# Patient Record
Sex: Female | Born: 1975
Health system: Southern US, Community
[De-identification: ages and names within clinical notes are randomized; demographics above are authoritative.]

## PROBLEM LIST (undated history)

## (undated) DIAGNOSIS — F32A Depression, unspecified: Secondary | ICD-10-CM

## (undated) DIAGNOSIS — D649 Anemia, unspecified: Secondary | ICD-10-CM

## (undated) DIAGNOSIS — I10 Essential (primary) hypertension: Secondary | ICD-10-CM

## (undated) DIAGNOSIS — C50511 Malignant neoplasm of lower-outer quadrant of right female breast: Secondary | ICD-10-CM

## (undated) DIAGNOSIS — Z8042 Family history of malignant neoplasm of prostate: Secondary | ICD-10-CM

## (undated) DIAGNOSIS — F419 Anxiety disorder, unspecified: Secondary | ICD-10-CM

## (undated) DIAGNOSIS — I890 Lymphedema, not elsewhere classified: Secondary | ICD-10-CM

## (undated) DIAGNOSIS — Z800 Family history of malignant neoplasm of digestive organs: Secondary | ICD-10-CM

## (undated) DIAGNOSIS — E785 Hyperlipidemia, unspecified: Secondary | ICD-10-CM

## (undated) DIAGNOSIS — Z5189 Encounter for other specified aftercare: Secondary | ICD-10-CM

## (undated) DIAGNOSIS — Z803 Family history of malignant neoplasm of breast: Secondary | ICD-10-CM

## (undated) DIAGNOSIS — Z923 Personal history of irradiation: Secondary | ICD-10-CM

## (undated) DIAGNOSIS — K219 Gastro-esophageal reflux disease without esophagitis: Secondary | ICD-10-CM

## (undated) DIAGNOSIS — Z9221 Personal history of antineoplastic chemotherapy: Secondary | ICD-10-CM

## (undated) HISTORY — DX: Family history of malignant neoplasm of breast: Z80.3

## (undated) HISTORY — DX: Depression, unspecified: F32.A

## (undated) HISTORY — PX: DILATION AND CURETTAGE OF UTERUS: SHX78

## (undated) HISTORY — DX: Family history of malignant neoplasm of digestive organs: Z80.0

## (undated) HISTORY — DX: Malignant neoplasm of lower-outer quadrant of right female breast: C50.511

## (undated) HISTORY — DX: Encounter for other specified aftercare: Z51.89

## (undated) HISTORY — DX: Family history of malignant neoplasm of prostate: Z80.42

## (undated) HISTORY — DX: Personal history of irradiation: Z92.3

## (undated) HISTORY — PX: REDUCTION MAMMAPLASTY: SUR839

---

## 1999-05-22 ENCOUNTER — Emergency Department (HOSPITAL_COMMUNITY): Admission: EM | Admit: 1999-05-22 | Discharge: 1999-05-22 | Payer: Self-pay | Admitting: Emergency Medicine

## 1999-06-29 ENCOUNTER — Encounter: Payer: Self-pay | Admitting: Obstetrics & Gynecology

## 1999-06-29 ENCOUNTER — Observation Stay (HOSPITAL_COMMUNITY): Admission: AD | Admit: 1999-06-29 | Discharge: 1999-06-29 | Payer: Self-pay | Admitting: Obstetrics

## 1999-06-29 ENCOUNTER — Encounter (INDEPENDENT_AMBULATORY_CARE_PROVIDER_SITE_OTHER): Payer: Self-pay

## 1999-12-31 ENCOUNTER — Inpatient Hospital Stay (HOSPITAL_COMMUNITY): Admission: AD | Admit: 1999-12-31 | Discharge: 2000-01-01 | Payer: Self-pay | Admitting: Obstetrics & Gynecology

## 1999-12-31 ENCOUNTER — Encounter (INDEPENDENT_AMBULATORY_CARE_PROVIDER_SITE_OTHER): Payer: Self-pay | Admitting: Specialist

## 1999-12-31 ENCOUNTER — Encounter: Payer: Self-pay | Admitting: Obstetrics & Gynecology

## 2000-10-27 ENCOUNTER — Encounter (INDEPENDENT_AMBULATORY_CARE_PROVIDER_SITE_OTHER): Payer: Self-pay | Admitting: Specialist

## 2000-10-27 ENCOUNTER — Inpatient Hospital Stay (HOSPITAL_COMMUNITY): Admission: AD | Admit: 2000-10-27 | Discharge: 2000-10-29 | Payer: Self-pay | Admitting: Obstetrics

## 2000-10-28 ENCOUNTER — Encounter: Payer: Self-pay | Admitting: Obstetrics

## 2009-03-14 ENCOUNTER — Encounter: Admission: RE | Admit: 2009-03-14 | Discharge: 2009-03-14 | Payer: Self-pay | Admitting: Obstetrics

## 2010-09-08 NOTE — Discharge Summary (Signed)
[facility]  Patient:    Melanie Moss, Melanie Moss                       MRN: [id number] Adm. Date:  [id number] Disc. Date: [id number] Attending:  Tammi Sou Dictator:   Marvis Moeller, C.N.M.                           Discharge Summary  ADMISSION DIAGNOSIS:          A 16-week intrauterine fetal demise.  DISCHARGE DIAGNOSES:          1. Spontaneous vaginal delivery at 16 weeks.                               2. Weak cervix.  HISTORY:                      This is a 35 year old, G3, P0-0-2-0, who presented to Maternity Admissions Unit at approximately [redacted] weeks gestation with no prior prenatal care. She reported the onset of cramping along with leakage of fluid that was blood-tinged. She also described pelvic pressure. Her obstetrical history was significant for two midtrimester SABs at approximately 16 weeks, also with minimal bleeding and cramping prior to passage of fetus. Her past medical history is noncontributory. She denied history of STDs. She denied fever, chills, or abdominal pain prior to todays pain. On presentation, she was afebrile at 97.2, pulse 103, blood pressure 140/86. The fetal heart rate was unobtainable per Doptone. Therefore, an ultrasound was done. This revealed a 15-week IUFD. The patient was admitted and given intravaginal Cytotec.  HOSPITAL COURSE:              On hospital day #1, she delivered vaginally a nonviable fetus that was grossly normal appearing. The placenta detached but required a sponge stick removal from the cervix. There was minimal bleeding and the patient was observed overnight to be sure she had no further bleeding. The placenta was sent for pathology and was 73 g with marked chorioamnionitis and a three-vessel cord, female fetus approximate age 62 to 15 weeks. Chromosomal studies were noted to have been sent but the results are not available at the time of this dictation.  DISCHARGE MEDICATIONS:        The  patient was sent home on Ortho Tri-Cyclen 28s to begin two weeks after her miscarriage. She was also given Niferex one p.o. every q.d. since she was anemic with a hemoglobin of 8.8 on October 29, 2000.   DISCHARGE FOLLOWUP:           She was counseled at length regarding her cervical weakness and the need to report her next pregnancy as early as possible, and the fact that she is a cerclage candidate with her next pregnancy. She was given an appointment to follow up in [facility] in two weeks and she also was visited by the Comfort Team and seemed to be coping appropriately with her loss. DD:  11/23/00 TD:  11/25/00 Job: [id number]

## 2010-11-17 LAB — HEPATITIS B SURFACE ANTIGEN: Hepatitis B Surface Ag: NEGATIVE

## 2010-11-17 LAB — HIV ANTIBODY (ROUTINE TESTING W REFLEX): HIV: NONREACTIVE

## 2010-11-21 ENCOUNTER — Other Ambulatory Visit: Payer: Self-pay | Admitting: Obstetrics

## 2010-11-21 DIAGNOSIS — O09529 Supervision of elderly multigravida, unspecified trimester: Secondary | ICD-10-CM

## 2010-12-22 ENCOUNTER — Ambulatory Visit (HOSPITAL_COMMUNITY)
Admission: RE | Admit: 2010-12-22 | Discharge: 2010-12-22 | Disposition: A | Discharge status: Home / Self Care | Payer: Managed Care, Other (non HMO) | Source: Ambulatory Visit | Attending: Obstetrics | Admitting: Obstetrics

## 2010-12-22 ENCOUNTER — Ambulatory Visit (HOSPITAL_COMMUNITY): Admission: RE | Admit: 2010-12-22 | Payer: Managed Care, Other (non HMO) | Source: Ambulatory Visit

## 2010-12-22 ENCOUNTER — Encounter (HOSPITAL_COMMUNITY): Payer: Self-pay

## 2010-12-22 VITALS — BP 143/97 | HR 80 | Wt 149.0 lb

## 2010-12-22 DIAGNOSIS — Z8751 Personal history of pre-term labor: Secondary | ICD-10-CM | POA: Insufficient documentation

## 2010-12-22 DIAGNOSIS — D649 Anemia, unspecified: Secondary | ICD-10-CM | POA: Insufficient documentation

## 2010-12-22 DIAGNOSIS — K219 Gastro-esophageal reflux disease without esophagitis: Secondary | ICD-10-CM | POA: Insufficient documentation

## 2010-12-22 DIAGNOSIS — O10019 Pre-existing essential hypertension complicating pregnancy, unspecified trimester: Secondary | ICD-10-CM | POA: Insufficient documentation

## 2010-12-22 DIAGNOSIS — I10 Essential (primary) hypertension: Secondary | ICD-10-CM | POA: Insufficient documentation

## 2010-12-22 DIAGNOSIS — O2620 Pregnancy care for patient with recurrent pregnancy loss, unspecified trimester: Secondary | ICD-10-CM | POA: Insufficient documentation

## 2010-12-22 DIAGNOSIS — O169 Unspecified maternal hypertension, unspecified trimester: Secondary | ICD-10-CM

## 2010-12-22 DIAGNOSIS — O09529 Supervision of elderly multigravida, unspecified trimester: Secondary | ICD-10-CM | POA: Insufficient documentation

## 2010-12-22 DIAGNOSIS — O09299 Supervision of pregnancy with other poor reproductive or obstetric history, unspecified trimester: Secondary | ICD-10-CM | POA: Insufficient documentation

## 2010-12-22 HISTORY — DX: Hyperlipidemia, unspecified: E78.5

## 2010-12-22 HISTORY — DX: Gastro-esophageal reflux disease without esophagitis: K21.9

## 2010-12-22 HISTORY — DX: Anemia, unspecified: D64.9

## 2010-12-22 HISTORY — DX: Essential (primary) hypertension: I10

## 2010-12-22 NOTE — Progress Notes (Signed)
Genetic Counseling  High-Risk Gestation Note  Appointment Date:  12/22/2010 Referred By: Brock Bad, MD Date of Birth:  11/22/1975  Pregnancy History: G5P0400 Estimated Date of Delivery: 07/04/11 Estimated Gestational Age: [redacted]w[redacted]d  I met with [name] for prenatal genetic counseling given advanced maternal age.   She was counseled regarding maternal age and the association with risk for chromosome conditions due to nondisjunction with aging of the ova.   We reviewed chromosomes, nondisjunction, and the associated 1 in 32 risk for fetal aneuploidy in the first trimester related to a maternal age of 54 at delivery.  She was counseled that the risk for aneuploidy decreases as gestational age increases, accounting for those pregnancies which spontaneously abort.  We specifically discussed Down syndrome (trisomy 97), trisomies 68 and 50, and sex chromosome aneuploidies (47,XXX and 47,XXY) including the common features and prognoses of each.    We reviewed available screening and diagnostic options.  Regarding screening tests, we discussed the options of First screen, Integrated screen, and ultrasound.  She understands that screening tests are used to modify a patient's a priori risk for aneuploidy, typically based on age.  This estimate provides a pregnancy specific risk assessment.  We also reviewed the availability of diagnostic options including CVS and amniocentesis.  risk of 1 in 100 was given for CVS and 1 in 200-300 was given for amniocentesis, the primary complication being spontaneous pregnancy loss.  It was discussed that not all birth defects can be identified with these procedures.   We discussed the risks, limitations, and benefits of each.  After reviewing these options, [name] elected to have First screen and will return in one week to obtain NT and blood work. She declined CVS and amniocentesis at this time.  She wishes to pursue these options to help ascertain her pregnancy  specific risks for aneuploidy and will possibly consider amniocentesis pending the results of this testing. She understands that ultrasound and First screen cannot rule out all birth defects or genetic syndromes.  However, they were counseled that 50-80% of fetuses with Down syndrome and up to 90% of fetuses with trisomies 13 and 18, when well visualized, have detectable anomalies or soft markers by ultrasound.  Both family histories were reviewed and found to be contributory  for recurrent pregnancy loss. The patient reported that her first three pregnancies, with a previous partner, resulted in a 23 week loss, 20 week loss, and 19 week loss. She reported that her fourth pregnancy, with a different partner, resulted in a 20 week loss. [name] stated that her D and C was performed at [facility] following her third loss and that studies were performed following the procedure. She was unsure what testing was done but stated that results were to be forward to Dr. Clearance Coots. We discussed that information regarding this testing may be helpful regarding an underlying cause. No additional work-up has been performed per the patient's report. There can be many different causes for pregnancy losses.  When a person has experienced more than three losses, we typically begin to search for specific factors causing the miscarriages.  We discussed several of these causes, including chromosome rearrangements, clotting factors, antibodies, and structural differences in the uterus. We are available to offer additional studies, if desired, in an attempt to determine the underlying cause for the patient's recurrent pregnancy loss. Without further information regarding the provided family history, an accurate genetic risk cannot be calculated.  Further genetic counseling is warranted if more  information is obtained.  We discussed sickle cell anemia (SCA) including: the features of SCA, autosomal recessive inheritance, the  approximate 1 in 10 carrier frequency in the [place] American population, and the availability of carrier testing.  We also discussed that testing for SCA can be done prenatally if needed, and it is also routinely screened for on the newborn screening panel.  Medical records indicate that the patient had a negative screen for hemoglobin S. Screening for additional hemoglobin variants is available through hemoglobin electrophoresis, if desired. The patient declined this at this time.   She denied exposure to environmental toxins or chemical agents.  She denied the use of alcohol, tobacco or street drugs.  She denied significant viral illnesses during the course of her pregnancy.  Her medical and surgical history were contibutory for hypertension for which she is treated with bystolic and for high cholesterol, for which she takes crestor. The patient was seen by Dr. Tona Sensing to further discuss management of these conditions in pregnancy.   A complete obstetrical ultrasound was performed at the time of today's evaluation.  The ultrasound report is reported separately.    We counseled the patient for approximately 40 minutes regarding the above risks.     Clydie Braun Venola Castello, MS, [facility] 12/22/2010

## 2010-12-22 NOTE — Progress Notes (Signed)
34 year old G5 P0130 at [redacted] weeks gestation with the following issues:  Hypertension complicating pregnancy 642.90 Multiyear history of chronic hypertension, requiring Bystolic 10 mg with recent poor control on this dose. Melanie Moss was counseled about the increased risk of poor fetal growth, preeclampsia, preterm birth, operative delivery in conjunction with pre-existing hypertension. If she acquires preeclampsia, it should be considered severe in the face of medication use for hypertension. Recommend increase Bystolic to 20 mg/day with max of 40 mg/day as needed to maintain BP in 130-140 systolic, 80-95 diastolic range.   Recommend 24 hour urine protein and creatinine clearance now and repeat at 12 week intervals to complete the pregnancy. Deliver at 39 weeks or sooner based on clinical findings. 2. Anemia 285.9  She has abnormal indices despite her hematocrit level and I agree with continued management with iron supplementation, with beneficial addition of ascorbic acid (e.g. Orange juice) 3 Acid reflux 530.81 Patient has history of recent nausea and vomiting but has positive response to signs/symptoms of reflux, not responding to Tums. Recommend use of Pepcid 20 mg BID, possibly larger dose if ineffective.  Return to vitamins, increased calcium and Vit D intake once she has improved her nausea/vomiting. 4. Recurrent pregnancy loss with current pregnancy; She describes four pregnancy losses between 18 weeks and 23 weeks, with and without signs of labor, no evidence of infection. Recommend initiation of 17 hydroxy progesterone at 16 weeks and continuation to 36 weeks.   Recommend initiation of transvaginal ultrasound for cervical integrity weekly from 16 weeks to 24 weeks.  Recommend fetal anatomic survey at 18-20 weeks, fetal growth assessment at 28 weeks and monthly thereafter, initiation of twice weekly NST's or weekly BPP from 32 weeks.  Total face-to-face consultation time was 40 minutes.   Thank you for the consultation.

## 2011-01-01 ENCOUNTER — Encounter (HOSPITAL_COMMUNITY): Payer: Self-pay | Admitting: *Deleted

## 2011-01-01 ENCOUNTER — Inpatient Hospital Stay (HOSPITAL_COMMUNITY): Payer: Managed Care, Other (non HMO)

## 2011-01-01 ENCOUNTER — Inpatient Hospital Stay (HOSPITAL_COMMUNITY)
Admission: AD | Admit: 2011-01-01 | Discharge: 2011-01-01 | Disposition: A | Discharge status: Home / Self Care | Payer: Managed Care, Other (non HMO) | Source: Ambulatory Visit | Attending: Obstetrics | Admitting: Obstetrics

## 2011-01-01 DIAGNOSIS — O209 Hemorrhage in early pregnancy, unspecified: Secondary | ICD-10-CM | POA: Insufficient documentation

## 2011-01-01 DIAGNOSIS — O4690 Antepartum hemorrhage, unspecified, unspecified trimester: Secondary | ICD-10-CM

## 2011-01-01 LAB — TYPE AND SCREEN
ABO/RH(D): O POS
Antibody Screen: NEGATIVE

## 2011-01-01 LAB — URINALYSIS, ROUTINE W REFLEX MICROSCOPIC
Bilirubin Urine: NEGATIVE
Glucose, UA: NEGATIVE mg/dL
Ketones, ur: NEGATIVE mg/dL
Leukocytes, UA: NEGATIVE
Nitrite: NEGATIVE
Protein, ur: NEGATIVE mg/dL
Specific Gravity, Urine: 1.02 (ref 1.005–1.030)
Urobilinogen, UA: 0.2 mg/dL (ref 0.0–1.0)
pH: 6 (ref 5.0–8.0)

## 2011-01-01 LAB — CBC
HCT: 34.7 % — ABNORMAL LOW (ref 36.0–46.0)
Hemoglobin: 11 g/dL — ABNORMAL LOW (ref 12.0–15.0)
MCH: 24.5 pg — ABNORMAL LOW (ref 26.0–34.0)
MCHC: 31.7 g/dL (ref 30.0–36.0)
MCV: 77.3 fL — ABNORMAL LOW (ref 78.0–100.0)
Platelets: 372 10*3/uL (ref 150–400)
RBC: 4.49 MIL/uL (ref 3.87–5.11)
RDW: 18.1 % — ABNORMAL HIGH (ref 11.5–15.5)
WBC: 12.2 10*3/uL — ABNORMAL HIGH (ref 4.0–10.5)

## 2011-01-01 LAB — WET PREP, GENITAL
Clue Cells Wet Prep HPF POC: NONE SEEN
Trich, Wet Prep: NONE SEEN
Yeast Wet Prep HPF POC: NONE SEEN

## 2011-01-01 LAB — URINE MICROSCOPIC-ADD ON

## 2011-01-01 LAB — ABO/RH: ABO/RH(D): O POS

## 2011-01-01 NOTE — Progress Notes (Signed)
Noted bleeding/spotting last night.  Has had a yellowish brown d/c- started on Sat. Had some cramps on Sat.  Denies any urinary problems. N/v continues.

## 2011-01-01 NOTE — Progress Notes (Signed)
Mild cramps, spotting. Cramps since Saturday, spotting since last night, red to dark brown in color.

## 2011-01-01 NOTE — ED Notes (Signed)
Has appt at [facility]FM tomorrow- high risk patient due to multiple miscarriages.

## 2011-01-01 NOTE — ED Provider Notes (Signed)
History     Chief Complaint  Patient presents with  . Vaginal Bleeding   35 yo G5P0400 at 13 weeks 5 days by LMP.  She has history of SAB with first 4 pregnancies between 14 and [redacted] weeks gestation.    Vaginal Bleeding The patient's primary symptoms include vaginal bleeding. The patient's pertinent negatives include no genital itching, genital lesions, genital odor, genital rash or vaginal discharge. This is a new problem. The current episode started today. Episode frequency: reddish spotting. The problem has been unchanged. The pain is mild (cramping). She is pregnant. Associated symptoms include frequency, nausea, urgency and vomiting. Pertinent negatives include no chills, constipation, diarrhea, dysuria, fever, headaches or hematuria. The vaginal discharge was bloody and brown. The vaginal bleeding is spotting. She has not been passing clots. She has not been passing tissue. The symptoms are aggravated by nothing. She has tried nothing for the symptoms.    OB History    Grav Para Term Preterm Abortions TAB SAB Ect Mult Living   5 4 0 4 0 0 0 0 0 0       Past Medical History  Diagnosis Date  . Hypertension   . Preterm labor   . GERD (gastroesophageal reflux disease)   . Anemia   . Hyperlipidemia     Past Surgical History  Procedure Date  . Dilation and curettage of uterus     Family History  Problem Relation Age of Onset  . Hypertension Mother   . Hypertension Father     History  Substance Use Topics  . Smoking status: Never Smoker   . Smokeless tobacco: Never Used  . Alcohol Use: No    Allergies: No Known Allergies  Prescriptions prior to admission  Medication Sig Dispense Refill  . nebivolol (BYSTOLIC) 10 MG tablet Take 10 mg by mouth daily.       . nebivolol (BYSTOLIC) 10 MG tablet Take 10 mg by mouth daily.        . Rosuvastatin Calcium (CRESTOR PO) Take by mouth.          Review of Systems  Constitutional: Negative for fever and chills.    Gastrointestinal: Positive for nausea and vomiting. Negative for diarrhea and constipation.  Genitourinary: Positive for urgency, frequency and vaginal bleeding. Negative for dysuria, hematuria and vaginal discharge.  Neurological: Negative for headaches.  All other systems reviewed and are negative.   Physical Exam   Blood pressure 135/100, pulse 74, temperature 99.2 F (37.3 C), temperature source Oral, resp. rate 20, height 5\' 3"  (1.6 m), weight 68.493 kg (151 lb), last menstrual period 09/27/2010, unknown if currently breastfeeding.  Physical Exam  Constitutional: She appears well-developed and well-nourished.  HENT:  Head: Normocephalic and atraumatic.  Eyes: EOM are normal. Pupils are equal, round, and reactive to light.  GI: Soft. She exhibits no distension and no mass. There is no tenderness. There is no rebound and no guarding.  Genitourinary: Cervix exhibits no discharge and no friability.       No pooling of blood in vaginal vault.  No blood from cervical os with valsalva.   FHR by Doppler 160 MAU Course  Procedures [place]  Assessment and Plan  CBC, type and screen, UA, wet prep, GC/chlamydia, [place] pending. Signed out to [place], [place].  Melanie Moss 01/01/2011, 7:40 PM

## 2011-01-01 NOTE — ED Provider Notes (Signed)
History     Chief Complaint  Patient presents with  . Vaginal Bleeding   35 yo G5P0400 at 13 weeks 5 days by LMP.  She has history of SAB with first 4 pregnancies between 14 and [redacted] weeks gestation.    Vaginal Bleeding The patient's primary symptoms include vaginal bleeding. The patient's pertinent negatives include no genital itching, genital lesions, genital odor, genital rash or vaginal discharge. This is a new problem. The current episode started today. Episode frequency: reddish spotting. The problem has been unchanged. The pain is mild (cramping). She is pregnant. Associated symptoms include frequency, nausea, urgency and vomiting. Pertinent negatives include no chills, constipation, diarrhea, dysuria, fever, headaches or hematuria. The vaginal discharge was bloody and brown. The vaginal bleeding is spotting. She has not been passing clots. She has not been passing tissue. The symptoms are aggravated by nothing. She has tried nothing for the symptoms.    OB History    Grav Para Term Preterm Abortions TAB SAB Ect Mult Living   5 4 0 4 0 0 0 0 0 0       Past Medical History  Diagnosis Date  . Hypertension   . Preterm labor   . GERD (gastroesophageal reflux disease)   . Anemia   . Hyperlipidemia     Past Surgical History  Procedure Date  . Dilation and curettage of uterus     Family History  Problem Relation Age of Onset  . Hypertension Mother   . Hypertension Father     History  Substance Use Topics  . Smoking status: Never Smoker   . Smokeless tobacco: Never Used  . Alcohol Use: No    Allergies: No Known Allergies  Prescriptions prior to admission  Medication Sig Dispense Refill  . nebivolol (BYSTOLIC) 10 MG tablet Take 10 mg by mouth daily.        Marland Kitchen DISCONTD: nebivolol (BYSTOLIC) 10 MG tablet Take 10 mg by mouth daily.       Marland Kitchen DISCONTD: Rosuvastatin Calcium (CRESTOR PO) Take by mouth.          Review of Systems  Constitutional: Negative for fever and  chills.  Gastrointestinal: Positive for nausea and vomiting. Negative for diarrhea and constipation.  Genitourinary: Positive for urgency, frequency and vaginal bleeding. Negative for dysuria, hematuria and vaginal discharge.  Neurological: Negative for headaches.  All other systems reviewed and are negative.   Physical Exam   Blood pressure 137/93, pulse 73, temperature 99.2 F (37.3 C), temperature source Oral, resp. rate 18, height 5\' 3"  (1.6 m), weight 68.493 kg (151 lb), last menstrual period 09/27/2010, unknown if currently breastfeeding.  Physical Exam  Constitutional: She appears well-developed and well-nourished.  HENT:  Head: Normocephalic and atraumatic.  Eyes: EOM are normal. Pupils are equal, round, and reactive to light.  GI: Soft. She exhibits no distension and no mass. There is no tenderness. There is no rebound and no guarding.  Genitourinary: Cervix exhibits no discharge and no friability.       No pooling of blood in vaginal vault.  No blood from cervical os with valsalva.   FHR by Doppler 160 MAU Course  Procedures  [place]: 12.2 week SIUP FHR 158 No evidence of abruption or previa. Placenta posterior Normal fluid Results for orders placed during the hospital encounter of 01/01/11 (from the past 24 hour(s))  URINALYSIS, ROUTINE W REFLEX MICROSCOPIC     Status: Abnormal   Collection Time   01/01/11  7:36 PM  Component Value Range   Color, Urine YELLOW  YELLOW    Appearance HAZY (*) CLEAR    Specific Gravity, Urine 1.020  1.005 - 1.030    pH 6.0  5.0 - 8.0    Glucose, UA NEGATIVE  NEGATIVE (mg/dL)   Hgb urine dipstick MODERATE (*) NEGATIVE    Bilirubin Urine NEGATIVE  NEGATIVE    Ketones, ur NEGATIVE  NEGATIVE (mg/dL)   Protein, ur NEGATIVE  NEGATIVE (mg/dL)   Urobilinogen, UA 0.2  0.0 - 1.0 (mg/dL)   Nitrite NEGATIVE  NEGATIVE    Leukocytes, UA NEGATIVE  NEGATIVE   WET PREP, GENITAL     Status: Abnormal   Collection Time   01/01/11  7:36 PM       Component Value Range   Yeast, Wet Prep NONE SEEN  NONE SEEN    Trich, Wet Prep NONE SEEN  NONE SEEN    Clue Cells, Wet Prep NONE SEEN  NONE SEEN    WBC, Wet Prep HPF POC MODERATE (*) NONE SEEN   URINE MICROSCOPIC-ADD ON     Status: Abnormal   Collection Time   01/01/11  7:36 PM      Component Value Range   Squamous Epithelial / LPF FEW (*) RARE    WBC, UA 0-2  <3 (WBC/hpf)   Bacteria, UA FEW (*) RARE   CBC     Status: Abnormal   Collection Time   01/01/11  8:38 PM      Component Value Range   WBC 12.2 (*) 4.0 - 10.5 (K/uL)   RBC 4.49  3.87 - 5.11 (MIL/uL)   Hemoglobin 11.0 (*) 12.0 - 15.0 (g/dL)   HCT 04.5 (*) 40.9 - 46.0 (%)   MCV 77.3 (*) 78.0 - 100.0 (fL)   MCH 24.5 (*) 26.0 - 34.0 (pg)   MCHC 31.7  30.0 - 36.0 (g/dL)   RDW 81.1 (*) 91.4 - 15.5 (%)   Platelets 372  150 - 400 (K/uL)  TYPE AND SCREEN     Status: Normal   Collection Time   01/01/11  8:39 PM      Component Value Range   ABO/RH(D) O POS     Antibody Screen NEG     Sample Expiration 01/04/2011      Assessment and Plan  CBC, type and screen, UA, wet prep, GC/chlamydia, [place] pending. Signed out to [place], [place].  Dorathy Kinsman 01/01/2011, 9:51 PM

## 2011-01-02 ENCOUNTER — Ambulatory Visit (HOSPITAL_COMMUNITY)
Admission: RE | Admit: 2011-01-02 | Discharge: 2011-01-02 | Disposition: A | Discharge status: Home / Self Care | Payer: Managed Care, Other (non HMO) | Source: Ambulatory Visit | Attending: Obstetrics | Admitting: Obstetrics

## 2011-01-02 DIAGNOSIS — O09529 Supervision of elderly multigravida, unspecified trimester: Secondary | ICD-10-CM | POA: Insufficient documentation

## 2011-01-02 DIAGNOSIS — O3510X0 Maternal care for (suspected) chromosomal abnormality in fetus, unspecified, not applicable or unspecified: Secondary | ICD-10-CM | POA: Insufficient documentation

## 2011-01-02 DIAGNOSIS — O169 Unspecified maternal hypertension, unspecified trimester: Secondary | ICD-10-CM

## 2011-01-02 DIAGNOSIS — Z8751 Personal history of pre-term labor: Secondary | ICD-10-CM | POA: Insufficient documentation

## 2011-01-02 DIAGNOSIS — O10019 Pre-existing essential hypertension complicating pregnancy, unspecified trimester: Secondary | ICD-10-CM | POA: Insufficient documentation

## 2011-01-02 DIAGNOSIS — O351XX0 Maternal care for (suspected) chromosomal abnormality in fetus, not applicable or unspecified: Secondary | ICD-10-CM | POA: Insufficient documentation

## 2011-01-02 DIAGNOSIS — O09299 Supervision of pregnancy with other poor reproductive or obstetric history, unspecified trimester: Secondary | ICD-10-CM | POA: Insufficient documentation

## 2011-01-02 DIAGNOSIS — O3410 Maternal care for benign tumor of corpus uteri, unspecified trimester: Secondary | ICD-10-CM | POA: Insufficient documentation

## 2011-01-02 LAB — GC/CHLAMYDIA PROBE AMP, GENITAL
Chlamydia, DNA Probe: NEGATIVE
GC Probe Amp, Genital: NEGATIVE

## 2011-01-02 NOTE — ED Notes (Signed)
Patient states she had bleeding yesterday and went to MAU for evaluation.

## 2011-01-19 ENCOUNTER — Ambulatory Visit (HOSPITAL_COMMUNITY)
Admission: RE | Admit: 2011-01-19 | Discharge: 2011-01-19 | Disposition: A | Discharge status: Home / Self Care | Payer: Managed Care, Other (non HMO) | Source: Ambulatory Visit | Attending: Obstetrics | Admitting: Obstetrics

## 2011-01-19 ENCOUNTER — Other Ambulatory Visit (HOSPITAL_COMMUNITY): Payer: Self-pay | Admitting: Obstetrics and Gynecology

## 2011-01-19 ENCOUNTER — Other Ambulatory Visit (HOSPITAL_COMMUNITY): Payer: Self-pay | Admitting: Maternal and Fetal Medicine

## 2011-01-19 DIAGNOSIS — O3410 Maternal care for benign tumor of corpus uteri, unspecified trimester: Secondary | ICD-10-CM | POA: Insufficient documentation

## 2011-01-19 DIAGNOSIS — O10019 Pre-existing essential hypertension complicating pregnancy, unspecified trimester: Secondary | ICD-10-CM | POA: Insufficient documentation

## 2011-01-19 DIAGNOSIS — O169 Unspecified maternal hypertension, unspecified trimester: Secondary | ICD-10-CM

## 2011-01-19 DIAGNOSIS — O2620 Pregnancy care for patient with recurrent pregnancy loss, unspecified trimester: Secondary | ICD-10-CM

## 2011-01-19 DIAGNOSIS — O09529 Supervision of elderly multigravida, unspecified trimester: Secondary | ICD-10-CM | POA: Insufficient documentation

## 2011-01-19 DIAGNOSIS — O09299 Supervision of pregnancy with other poor reproductive or obstetric history, unspecified trimester: Secondary | ICD-10-CM | POA: Insufficient documentation

## 2011-01-19 DIAGNOSIS — Z8751 Personal history of pre-term labor: Secondary | ICD-10-CM | POA: Insufficient documentation

## 2011-02-09 ENCOUNTER — Other Ambulatory Visit (HOSPITAL_COMMUNITY): Payer: Self-pay | Admitting: Obstetrics and Gynecology

## 2011-02-09 ENCOUNTER — Ambulatory Visit (HOSPITAL_COMMUNITY)
Admission: RE | Admit: 2011-02-09 | Discharge: 2011-02-09 | Disposition: A | Discharge status: Home / Self Care | Payer: Managed Care, Other (non HMO) | Source: Ambulatory Visit | Attending: Obstetrics | Admitting: Obstetrics

## 2011-02-09 DIAGNOSIS — Z8751 Personal history of pre-term labor: Secondary | ICD-10-CM | POA: Insufficient documentation

## 2011-02-09 DIAGNOSIS — Z1389 Encounter for screening for other disorder: Secondary | ICD-10-CM | POA: Insufficient documentation

## 2011-02-09 DIAGNOSIS — O169 Unspecified maternal hypertension, unspecified trimester: Secondary | ICD-10-CM

## 2011-02-09 DIAGNOSIS — O09529 Supervision of elderly multigravida, unspecified trimester: Secondary | ICD-10-CM | POA: Insufficient documentation

## 2011-02-09 DIAGNOSIS — O2620 Pregnancy care for patient with recurrent pregnancy loss, unspecified trimester: Secondary | ICD-10-CM

## 2011-02-09 DIAGNOSIS — O358XX0 Maternal care for other (suspected) fetal abnormality and damage, not applicable or unspecified: Secondary | ICD-10-CM | POA: Insufficient documentation

## 2011-02-09 DIAGNOSIS — O3410 Maternal care for benign tumor of corpus uteri, unspecified trimester: Secondary | ICD-10-CM | POA: Insufficient documentation

## 2011-02-09 DIAGNOSIS — O10019 Pre-existing essential hypertension complicating pregnancy, unspecified trimester: Secondary | ICD-10-CM | POA: Insufficient documentation

## 2011-02-09 DIAGNOSIS — O09299 Supervision of pregnancy with other poor reproductive or obstetric history, unspecified trimester: Secondary | ICD-10-CM | POA: Insufficient documentation

## 2011-02-09 DIAGNOSIS — Z363 Encounter for antenatal screening for malformations: Secondary | ICD-10-CM | POA: Insufficient documentation

## 2011-02-15 ENCOUNTER — Other Ambulatory Visit: Payer: Self-pay | Admitting: Obstetrics & Gynecology

## 2011-02-15 ENCOUNTER — Ambulatory Visit (HOSPITAL_COMMUNITY)
Admission: RE | Admit: 2011-02-15 | Discharge: 2011-02-15 | Disposition: A | Discharge status: Home / Self Care | Payer: Managed Care, Other (non HMO) | Source: Ambulatory Visit | Attending: Obstetrics | Admitting: Obstetrics

## 2011-02-15 ENCOUNTER — Other Ambulatory Visit: Payer: Self-pay | Admitting: Obstetrics

## 2011-02-15 DIAGNOSIS — O3410 Maternal care for benign tumor of corpus uteri, unspecified trimester: Secondary | ICD-10-CM | POA: Insufficient documentation

## 2011-02-15 DIAGNOSIS — Z09 Encounter for follow-up examination after completed treatment for conditions other than malignant neoplasm: Secondary | ICD-10-CM

## 2011-02-15 DIAGNOSIS — Z8751 Personal history of pre-term labor: Secondary | ICD-10-CM | POA: Insufficient documentation

## 2011-02-15 DIAGNOSIS — O2620 Pregnancy care for patient with recurrent pregnancy loss, unspecified trimester: Secondary | ICD-10-CM

## 2011-02-15 DIAGNOSIS — O09529 Supervision of elderly multigravida, unspecified trimester: Secondary | ICD-10-CM | POA: Insufficient documentation

## 2011-02-15 DIAGNOSIS — O09299 Supervision of pregnancy with other poor reproductive or obstetric history, unspecified trimester: Secondary | ICD-10-CM | POA: Insufficient documentation

## 2011-02-15 DIAGNOSIS — O10019 Pre-existing essential hypertension complicating pregnancy, unspecified trimester: Secondary | ICD-10-CM | POA: Insufficient documentation

## 2011-02-15 DIAGNOSIS — O169 Unspecified maternal hypertension, unspecified trimester: Secondary | ICD-10-CM

## 2011-02-16 ENCOUNTER — Ambulatory Visit (HOSPITAL_COMMUNITY): Payer: Managed Care, Other (non HMO)

## 2011-02-19 ENCOUNTER — Encounter (HOSPITAL_COMMUNITY): Payer: Self-pay | Admitting: Obstetrics & Gynecology

## 2011-02-19 ENCOUNTER — Ambulatory Visit (HOSPITAL_COMMUNITY): Payer: Managed Care, Other (non HMO) | Admitting: Anesthesiology

## 2011-02-19 ENCOUNTER — Encounter (HOSPITAL_COMMUNITY): Payer: Self-pay | Admitting: Anesthesiology

## 2011-02-19 ENCOUNTER — Encounter (HOSPITAL_COMMUNITY)
Admission: RE | Disposition: A | Discharge status: Home / Self Care | Payer: Self-pay | Source: Ambulatory Visit | Attending: Obstetrics & Gynecology

## 2011-02-19 ENCOUNTER — Ambulatory Visit (HOSPITAL_COMMUNITY)
Admission: RE | Admit: 2011-02-19 | Discharge: 2011-02-19 | Disposition: A | Discharge status: Home / Self Care | Payer: Managed Care, Other (non HMO) | Source: Ambulatory Visit | Attending: Obstetrics & Gynecology | Admitting: Obstetrics & Gynecology

## 2011-02-19 DIAGNOSIS — N883 Incompetence of cervix uteri: Secondary | ICD-10-CM | POA: Diagnosis present

## 2011-02-19 DIAGNOSIS — O3430 Maternal care for cervical incompetence, unspecified trimester: Secondary | ICD-10-CM | POA: Insufficient documentation

## 2011-02-19 HISTORY — PX: CERVICAL CERCLAGE: SHX1329

## 2011-02-19 LAB — CBC
HCT: 32.4 % — ABNORMAL LOW (ref 36.0–46.0)
Hemoglobin: 10.4 g/dL — ABNORMAL LOW (ref 12.0–15.0)
MCH: 24.9 pg — ABNORMAL LOW (ref 26.0–34.0)
MCHC: 32.1 g/dL (ref 30.0–36.0)
MCV: 77.5 fL — ABNORMAL LOW (ref 78.0–100.0)
Platelets: 364 10*3/uL (ref 150–400)
RBC: 4.18 MIL/uL (ref 3.87–5.11)
RDW: 17.2 % — ABNORMAL HIGH (ref 11.5–15.5)
WBC: 13.1 10*3/uL — ABNORMAL HIGH (ref 4.0–10.5)

## 2011-02-19 LAB — BASIC METABOLIC PANEL
BUN: 9 mg/dL (ref 6–23)
CO2: 22 mEq/L (ref 19–32)
Calcium: 10.2 mg/dL (ref 8.4–10.5)
Chloride: 104 mEq/L (ref 96–112)
Creatinine, Ser: 0.65 mg/dL (ref 0.50–1.10)
GFR calc Af Amer: >90 mL/min (ref >90)
GFR calc non Af Amer: >90 mL/min (ref >90)
Glucose, Bld: 101 mg/dL — ABNORMAL HIGH (ref 70–99)
Potassium: 3.8 mEq/L (ref 3.5–5.1)
Sodium: 136 mEq/L (ref 135–145)

## 2011-02-19 SURGERY — CERCLAGE, CERVIX, VAGINAL APPROACH
Anesthesia: Spinal | Site: Vagina | Wound class: Clean Contaminated

## 2011-02-19 MED ORDER — LACTATED RINGERS IV SOLN
INTRAVENOUS | Status: DC
  Administered 2011-02-19: 50 mL/h via INTRAVENOUS
  Administered 2011-02-19 (×3): via INTRAVENOUS

## 2011-02-19 MED ORDER — BUPIVACAINE IN DEXTROSE 0.75-8.25 % IT SOLN
INTRATHECAL | Status: DC | PRN
  Administered 2011-02-19: 1.2 mL via INTRATHECAL

## 2011-02-19 MED ORDER — FENTANYL CITRATE 0.05 MG/ML IJ SOLN: 25.0000 ug | INTRAMUSCULAR | Status: DC | PRN

## 2011-02-19 MED ORDER — IBUPROFEN 200 MG PO TABS: 600.0000 mg | ORAL_TABLET | Freq: Four times a day (QID) | ORAL | Status: DC | PRN

## 2011-02-19 MED ORDER — MEPERIDINE HCL 25 MG/ML IJ SOLN: 6.2500 mg | INTRAMUSCULAR | Status: DC | PRN

## 2011-02-19 MED ORDER — PROMETHAZINE HCL 25 MG/ML IJ SOLN: 6.2500 mg | INTRAMUSCULAR | Status: DC | PRN

## 2011-02-19 MED ORDER — IBUPROFEN 200 MG PO TABS
200.0000 mg | ORAL_TABLET | Freq: Four times a day (QID) | ORAL | Status: DC | PRN
  Administered 2011-02-19 (×2): 200 mg via ORAL

## 2011-02-19 MED ORDER — IBUPROFEN 200 MG PO TABS
ORAL_TABLET | ORAL | Status: AC
  Administered 2011-02-19: 200 mg via ORAL
  Filled 2011-02-19: qty 2

## 2011-02-19 SURGICAL SUPPLY — 24 items
BLADE SURG 15 STRL LF C SS BP (BLADE) ×1 IMPLANT
BLADE SURG 15 STRL SS (BLADE) ×2
CATH ROBINSON RED A/P 16FR (CATHETERS) IMPLANT
CLOTH BEACON ORANGE TIMEOUT ST (SAFETY) ×2 IMPLANT
COUNTER NEEDLE 1200 MAGNETIC (NEEDLE) IMPLANT
FORMULA 20CAL 3 OZ MEAD (FORMULA) IMPLANT
GLOVE BIO SURGEON STRL SZ 6.5 (GLOVE) ×4 IMPLANT
GLOVE BIO SURGEON STRL SZ7 (GLOVE) ×2 IMPLANT
GOWN PREVENTION PLUS LG XLONG (DISPOSABLE) ×4 IMPLANT
NDL SPNL 22GX3.5 QUINCKE BK (NEEDLE) IMPLANT
NEEDLE MAYO .5 CIRCLE (NEEDLE) ×2 IMPLANT
NEEDLE SPNL 22GX3.5 QUINCKE BK (NEEDLE) IMPLANT
NS IRRIG 1000ML POUR BTL (IV SOLUTION) ×2 IMPLANT
PACK VAGINAL MINOR WOMEN LF (CUSTOM PROCEDURE TRAY) ×2 IMPLANT
PAD PREP 24X48 CUFFED NSTRL (MISCELLANEOUS) ×2 IMPLANT
SUT CHROMIC 2 0 SH (SUTURE) ×1 IMPLANT
SUT SILK 4 2X60 (SUTURE) ×2 IMPLANT
SYR 30ML LL (SYRINGE) IMPLANT
SYR BULB IRRIGATION 50ML (SYRINGE) ×2 IMPLANT
SYR CONTROL 10ML LL (SYRINGE) IMPLANT
TOWEL OR 17X24 6PK STRL BLUE (TOWEL DISPOSABLE) ×4 IMPLANT
TUBING NON-CON 1/4 X 20 CONN (TUBING) ×1 IMPLANT
WATER STERILE IRR 1000ML POUR (IV SOLUTION) ×2 IMPLANT
YANKAUER SUCT BULB TIP NO VENT (SUCTIONS) ×1 IMPLANT

## 2011-02-19 NOTE — Anesthesia Preprocedure Evaluation (Signed)
Anesthesia Evaluation  Patient identified by MRN, date of birth, ID band Patient awake  General Assessment Comment  Reviewed: Allergy & Precautions, H&P , NPO status , Patient's Chart, lab work & pertinent test results, reviewed documented beta blocker date and time   Airway Mallampati: II TM Distance: >3 FB Neck ROM: Full    Dental No notable dental hx. (+) Teeth Intact   Pulmonary  clear to auscultation  Pulmonary exam normal       Cardiovascular hypertension, Pt. on home beta blockers and Pt. on medications Regular Normal Takes Bystolic in pm. Took dose last at 1900 yesterday.   Neuro/Psych Negative Neurological ROS  Negative Psych ROS   GI/Hepatic Neg liver ROS  GERD Medicated and Controlled  Endo/Other  Negative Endocrine ROS  Renal/GU negative Renal ROS  Genitourinary negative   Musculoskeletal negative musculoskeletal ROS (+)   Abdominal Normal abdominal exam  (+)   Peds  Hematology negative hematology ROS (+)   Anesthesia Other Findings   Reproductive/Obstetrics negative OB ROS (+) Pregnancy G6P0400                           Anesthesia Physical Anesthesia Plan  ASA: II  Anesthesia Plan: Spinal   Post-op Pain Management:    Induction:   Airway Management Planned: Natural Airway  Additional Equipment:   Intra-op Plan:   Post-operative Plan:   Informed Consent: I have reviewed the patients History and Physical, chart, labs and discussed the procedure including the risks, benefits and alternatives for the proposed anesthesia with the patient or authorized representative who has indicated his/her understanding and acceptance.     Plan Discussed with: CRNA, Surgeon and Anesthesiologist  Anesthesia Plan Comments:         Anesthesia Quick Evaluation

## 2011-02-19 NOTE — Preoperative (Signed)
Beta Blockers   Reason not to administer Beta Blockers:Hold beta blocker due to other, patient took last p.m. Per schedule

## 2011-02-19 NOTE — Progress Notes (Signed)
Attempted to get pt out of bed, unable to stand at present, will continue to monitor and attempt ambulation later

## 2011-02-19 NOTE — H&P (Signed)
  Chief Complaint: 35 y.o.  who presents with cervical insufficiency  Details of Present Illness: Poor OB history.  Being followed collaboratively with MFM.  Serial cervical length measurements have showed mild funneling with good residual/closed length.  A prophylactic cerclage was recommended.  LMP 09/27/2010  Breastfeeding? Unknown  Past Medical History  Diagnosis Date  . Hypertension   . Preterm labor   . GERD (gastroesophageal reflux disease)   . Anemia   . Hyperlipidemia    History   Social History  . Marital Status: Single    Spouse Name: N/A    Number of Children: N/A  . Years of Education: N/A   Occupational History  . Not on file.   Social History Main Topics  . Smoking status: Never Smoker   . Smokeless tobacco: Never Used  . Alcohol Use: No  . Drug Use: No  . Sexually Active: Yes   Other Topics Concern  . Not on file   Social History Narrative  . No narrative on file   Family History  Problem Relation Age of Onset  . Hypertension Mother   . Hypertension Father     Pertinent items are noted in HPI.  Pre-Op Diagnosis: poor ob history/cervical insufficiency   Planned Procedure: Procedure(s): CERCLAGE CERVICAL  I have reviewed the patient's history and have completed the physical exam and Melanie Moss is acceptable for surgery.  Roseanna Rainbow, MD 02/19/2011 10:05 AM

## 2011-02-19 NOTE — Transfer of Care (Addendum)
Immediate Anesthesia Transfer of Care Note  Patient: Melanie Moss  Procedure(s) Performed:  CERCLAGE CERVICAL  Patient Location: PACU  Anesthesia Type: Regional  Level of Consciousness: awake, alert , oriented and patient cooperative  Airway & Oxygen Therapy: Patient Spontanous Breathing and Patient connected to nasal cannula oxygen  Post-op Assessment: Report given to PACU RN and Post -op Vital signs reviewed and stable  Post vital signs: Reviewed and stable  Complications: No apparent anesthesia complications

## 2011-02-19 NOTE — Anesthesia Procedure Notes (Signed)
Spinal Block  Patient location during procedure: OR Start time: 02/19/2011 1:33 PM Staffing Anesthesiologist: Earlena Werst A. Performed by: anesthesiologist  Preanesthetic Checklist Completed: patient identified, site marked, surgical consent, pre-op evaluation, timeout performed, IV checked, risks and benefits discussed and monitors and equipment checked Spinal Block Patient position: sitting Prep: site prepped and draped and DuraPrep Patient monitoring: heart rate, cardiac monitor, continuous pulse ox and blood pressure Approach: midline Location: L3-4 Injection technique: single-shot Needle Needle type: Sprotte  Needle gauge: 24 G Needle length: 9 cm Needle insertion depth: 4 cm Assessment Sensory level: T8 Additional Notes Patient tolerated procedure well. Adequate sensory level.

## 2011-02-19 NOTE — Anesthesia Postprocedure Evaluation (Signed)
Anesthesia Post Note  Patient: Melanie Moss  Procedure(s) Performed:  CERCLAGE CERVICAL  Anesthesia type: Spinal  Patient location: PACU  Post pain: Pain level controlled  Post assessment: Post-op Vital signs reviewed  Last Vitals:  Filed Vitals:   02/19/11 1400  BP: 101/56  Pulse: 76  Temp: 36.4 C  Resp: 11    Post vital signs: Reviewed  Level of consciousness: awake  Complications: No apparent anesthesia complications

## 2011-02-19 NOTE — Progress Notes (Signed)
Doppler FHR 150 at present

## 2011-02-19 NOTE — Op Note (Signed)
Procedure(s): CERCLAGE CERVICAL Procedure Note  Melanie Moss female 35 y.o. 02/19/2011  Procedure(s) and Anesthesia Type:    * CERCLAGE CERVICAL - Spinal  Surgeon(s) and Role:    * Roseanna Rainbow, MD - Primary   Indications: Poor obstetrical history.   The patient now presents for a cervical cerclage after discussing therapeutic alternatives.        Surgeon: Roseanna Rainbow   Assistants: None  Anesthesia: Spinal anesthesia  ASA Class: 1    Procedure Detail  CERCLAGE CERVICAL  The patient was brought to the OR with an IV running. A spinal anesthetic was then administered. The patient was placed in the semi-lithotomy position in [place]way stirrups and prepped and draped in the usual sterile fashion. After a timeout had been completed a weighted speculum and Deaver's retractors were placed into the vagina. The sponge forceps was then used to grasp the anterior lip of the cervix. Using a double #4 silk suture on a Mayo needle a purse string suture was placed in the cervical stroma circumferentially starting at 12:00. Care was taken to avoid the endocervical canal. This suture was tied at 12:00 and cut. The vagina was then irrigated. There was minimal bleeding noted from. The retractors were removed from the vagina. The cervix was palpated and a good result was noted. At the closeof the procedure the instrument and pack counts were said to be correct x 2. The patient was taken to the PACU awake and in stable condition.    Findings: The cervix was closed with good length.  Estimated Blood Loss:  Minimal                  Total IV Fluids: per Anesthesiology  Blood Given: none          Specimens: N/A         Implants: none        Complications:  None         Disposition: PACU - hemodynamically stable.         Condition: stable

## 2011-02-20 ENCOUNTER — Encounter (HOSPITAL_COMMUNITY): Payer: Self-pay | Admitting: Obstetrics & Gynecology

## 2011-02-23 ENCOUNTER — Other Ambulatory Visit (HOSPITAL_COMMUNITY): Payer: Managed Care, Other (non HMO)

## 2011-03-02 ENCOUNTER — Encounter (HOSPITAL_COMMUNITY): Payer: Self-pay

## 2011-03-02 ENCOUNTER — Ambulatory Visit (HOSPITAL_COMMUNITY)
Admission: RE | Admit: 2011-03-02 | Discharge: 2011-03-02 | Disposition: A | Discharge status: Home / Self Care | Payer: Managed Care, Other (non HMO) | Source: Ambulatory Visit | Attending: Obstetrics | Admitting: Obstetrics

## 2011-03-02 DIAGNOSIS — Z09 Encounter for follow-up examination after completed treatment for conditions other than malignant neoplasm: Secondary | ICD-10-CM

## 2011-03-02 DIAGNOSIS — Z8751 Personal history of pre-term labor: Secondary | ICD-10-CM | POA: Insufficient documentation

## 2011-03-02 DIAGNOSIS — O09299 Supervision of pregnancy with other poor reproductive or obstetric history, unspecified trimester: Secondary | ICD-10-CM | POA: Insufficient documentation

## 2011-03-02 DIAGNOSIS — O3410 Maternal care for benign tumor of corpus uteri, unspecified trimester: Secondary | ICD-10-CM | POA: Insufficient documentation

## 2011-03-02 DIAGNOSIS — O2620 Pregnancy care for patient with recurrent pregnancy loss, unspecified trimester: Secondary | ICD-10-CM

## 2011-03-02 DIAGNOSIS — O169 Unspecified maternal hypertension, unspecified trimester: Secondary | ICD-10-CM

## 2011-03-02 DIAGNOSIS — O09529 Supervision of elderly multigravida, unspecified trimester: Secondary | ICD-10-CM | POA: Insufficient documentation

## 2011-03-02 DIAGNOSIS — O10019 Pre-existing essential hypertension complicating pregnancy, unspecified trimester: Secondary | ICD-10-CM | POA: Insufficient documentation

## 2011-03-02 NOTE — Progress Notes (Signed)
Ultrasound in AS/OBGYN/EPIC.  Follow up U/S scheduled 

## 2011-03-09 ENCOUNTER — Inpatient Hospital Stay (HOSPITAL_COMMUNITY)
Admission: AD | Admit: 2011-03-09 | Discharge: 2011-05-19 | DRG: 781 | Disposition: A | Discharge status: Home / Self Care | Payer: Managed Care, Other (non HMO) | Source: Ambulatory Visit | Attending: Obstetrics & Gynecology | Admitting: Obstetrics & Gynecology

## 2011-03-09 ENCOUNTER — Ambulatory Visit (HOSPITAL_COMMUNITY)
Admission: RE | Admit: 2011-03-09 | Discharge: 2011-03-09 | Disposition: A | Discharge status: Home / Self Care | Payer: Managed Care, Other (non HMO) | Source: Ambulatory Visit | Attending: Maternal and Fetal Medicine | Admitting: Maternal and Fetal Medicine

## 2011-03-09 ENCOUNTER — Encounter (HOSPITAL_COMMUNITY): Payer: Self-pay | Admitting: *Deleted

## 2011-03-09 DIAGNOSIS — I10 Essential (primary) hypertension: Secondary | ICD-10-CM | POA: Insufficient documentation

## 2011-03-09 DIAGNOSIS — N883 Incompetence of cervix uteri: Secondary | ICD-10-CM

## 2011-03-09 DIAGNOSIS — O10019 Pre-existing essential hypertension complicating pregnancy, unspecified trimester: Secondary | ICD-10-CM | POA: Insufficient documentation

## 2011-03-09 DIAGNOSIS — O24419 Gestational diabetes mellitus in pregnancy, unspecified control: Secondary | ICD-10-CM

## 2011-03-09 DIAGNOSIS — O2620 Pregnancy care for patient with recurrent pregnancy loss, unspecified trimester: Secondary | ICD-10-CM

## 2011-03-09 DIAGNOSIS — O169 Unspecified maternal hypertension, unspecified trimester: Secondary | ICD-10-CM

## 2011-03-09 DIAGNOSIS — O3430 Maternal care for cervical incompetence, unspecified trimester: Principal | ICD-10-CM | POA: Diagnosis present

## 2011-03-09 DIAGNOSIS — O3410 Maternal care for benign tumor of corpus uteri, unspecified trimester: Secondary | ICD-10-CM | POA: Insufficient documentation

## 2011-03-09 DIAGNOSIS — Z8751 Personal history of pre-term labor: Secondary | ICD-10-CM | POA: Insufficient documentation

## 2011-03-09 DIAGNOSIS — D649 Anemia, unspecified: Secondary | ICD-10-CM

## 2011-03-09 DIAGNOSIS — K219 Gastro-esophageal reflux disease without esophagitis: Secondary | ICD-10-CM

## 2011-03-09 DIAGNOSIS — O09899 Supervision of other high risk pregnancies, unspecified trimester: Secondary | ICD-10-CM

## 2011-03-09 DIAGNOSIS — O09299 Supervision of pregnancy with other poor reproductive or obstetric history, unspecified trimester: Secondary | ICD-10-CM | POA: Insufficient documentation

## 2011-03-09 DIAGNOSIS — O26879 Cervical shortening, unspecified trimester: Secondary | ICD-10-CM

## 2011-03-09 DIAGNOSIS — O99810 Abnormal glucose complicating pregnancy: Secondary | ICD-10-CM | POA: Diagnosis present

## 2011-03-09 DIAGNOSIS — O09529 Supervision of elderly multigravida, unspecified trimester: Secondary | ICD-10-CM | POA: Insufficient documentation

## 2011-03-09 MED ORDER — CALCIUM CARBONATE ANTACID 500 MG PO CHEW
2.0000 | CHEWABLE_TABLET | ORAL | Status: DC | PRN
  Administered 2011-04-22 – 2011-05-11 (×3): 400 mg via ORAL
  Filled 2011-03-09 (×3): qty 2

## 2011-03-09 MED ORDER — INDOMETHACIN 50 MG PO CAPS
50.0000 mg | ORAL_CAPSULE | Freq: Once | ORAL | Status: AC | PRN
  Filled 2011-03-09: qty 1

## 2011-03-09 MED ORDER — INDOMETHACIN 25 MG PO CAPS
25.0000 mg | ORAL_CAPSULE | Freq: Four times a day (QID) | ORAL | Status: DC | PRN
  Filled 2011-03-09: qty 1

## 2011-03-09 MED ORDER — ZOLPIDEM TARTRATE 10 MG PO TABS
10.0000 mg | ORAL_TABLET | Freq: Every evening | ORAL | Status: DC | PRN
  Administered 2011-03-09 – 2011-05-18 (×62): 10 mg via ORAL
  Filled 2011-03-09 (×62): qty 1

## 2011-03-09 MED ORDER — HYDROXYPROGESTERONE CAPROATE 250 MG/ML IM OIL
250.0000 mg | TOPICAL_OIL | INTRAMUSCULAR | Status: DC
  Administered 2011-03-15 – 2011-05-17 (×10): 250 mg via INTRAMUSCULAR
  Filled 2011-03-09 (×7): qty 1

## 2011-03-09 MED ORDER — NEBIVOLOL HCL 5 MG PO TABS
15.0000 mg | ORAL_TABLET | Freq: Every day | ORAL | Status: DC
  Filled 2011-03-09: qty 1

## 2011-03-09 MED ORDER — NEBIVOLOL HCL 5 MG PO TABS
15.0000 mg | ORAL_TABLET | Freq: Every day | ORAL | Status: DC
  Administered 2011-03-09 – 2011-03-23 (×15): 15 mg via ORAL
  Filled 2011-03-09 (×15): qty 1

## 2011-03-09 MED ORDER — POLYETHYLENE GLYCOL 3350 17 G PO PACK
17.0000 g | PACK | Freq: Every day | ORAL | Status: DC | PRN
  Administered 2011-03-09: 17 g via ORAL
  Filled 2011-03-09: qty 1

## 2011-03-09 MED ORDER — PRENATAL PLUS 27-1 MG PO TABS
1.0000 | ORAL_TABLET | Freq: Every day | ORAL | Status: DC
  Administered 2011-03-10 – 2011-05-19 (×71): 1 via ORAL
  Filled 2011-03-09 (×72): qty 1

## 2011-03-09 MED ORDER — DOCUSATE SODIUM 100 MG PO CAPS
100.0000 mg | ORAL_CAPSULE | Freq: Every day | ORAL | Status: DC
  Administered 2011-03-10 – 2011-05-17 (×69): 100 mg via ORAL
  Administered 2011-05-18: 200 mg via ORAL
  Administered 2011-05-19: 100 mg via ORAL
  Filled 2011-03-09 (×73): qty 1

## 2011-03-09 MED ORDER — ACETAMINOPHEN 325 MG PO TABS
650.0000 mg | ORAL_TABLET | ORAL | Status: DC | PRN
  Administered 2011-03-13 – 2011-03-28 (×8): 650 mg via ORAL
  Filled 2011-03-09 (×8): qty 2

## 2011-03-09 NOTE — H&P (Signed)
Melanie Moss is a 35 y.o. female presenting for bedrest, hospitalized. Maternal Medical History:  Reason for admission: Reason for admission: nausea.  Short cervix.  Cerclage in situ.  Cervical length measurement today 1.2 cm w/funneling.  Prenatal complications: See above.    OB History    Grav Para Term Preterm Abortions TAB SAB Ect Mult Living   6 4 0 4 0 0 0 0 0 0      Past Medical History  Diagnosis Date  . Hypertension   . Preterm labor   . GERD (gastroesophageal reflux disease)   . Anemia   . Hyperlipidemia    Past Surgical History  Procedure Date  . Dilation and curettage of uterus   . Cervical cerclage 02/19/2011    Procedure: CERCLAGE CERVICAL;  Surgeon: Roseanna Rainbow, MD;  Location: [facility];  Service: Gynecology;  Laterality: N/A;   Family History: family history includes Hypertension in her father and mother. Social History:  reports that she has never smoked. She has never used smokeless tobacco. She reports that she does not drink alcohol or use illicit drugs.  Review of Systems  Constitutional: Negative for fever.  Eyes: Negative for blurred vision.  Respiratory: Negative for shortness of breath.   Gastrointestinal: Positive for nausea and constipation. Negative for vomiting.  Skin: Negative for rash.  Neurological: Negative for headaches.      Blood pressure 132/87, pulse 91, temperature 98 F (36.7 C), temperature source Oral, resp. rate 20, height 5\' 3"  (1.6 m), weight 72.576 kg (160 lb), last menstrual period 09/27/2010. Maternal Exam:  Abdomen: Patient reports no abdominal tenderness. Introitus: not evaluated.     Fetal Exam Fetal Monitor Review: Mode: hand-held doppler probe.       Physical Exam  Constitutional: She appears well-developed.  HENT:  Head: Normocephalic.  Neck: Neck supple. No thyromegaly present.  Cardiovascular: Normal rate and regular rhythm.   Respiratory: Breath sounds normal.  GI: Soft. Bowel sounds are  normal.  Skin: No rash noted.    Prenatal labs: ABO, Rh: --/--/O POS (09/10 2040) Antibody: NEG (09/10 2039) Rubella:   RPR:    HBsAg:    HIV:    GBS:     Assessment/Plan: 35 y.o. with an IUP @ [redacted]w[redacted]d.  H/O cervical insufficiency.  Cerclage in situ w/progressive cervical changes.  Admit Tocolysis for uterine activity Bedrest Continue 17P  JACKSON-MOORE,Edwinna Rochette A 03/09/2011, 11:48 PM

## 2011-03-09 NOTE — Progress Notes (Signed)
Melanie Moss was seen for ultrasound appointment today.  Please see [facility]-OBGYN report for details.  

## 2011-03-10 MED ORDER — INDOMETHACIN 25 MG PO CAPS: 25.0000 mg | ORAL_CAPSULE | Freq: Four times a day (QID) | ORAL | Status: DC

## 2011-03-10 MED ORDER — GLYCERIN (LAXATIVE) 2.1 G RE SUPP
1.0000 | Freq: Once | RECTAL | Status: AC
  Administered 2011-03-10: 16:00:00 via RECTAL
  Filled 2011-03-10: qty 1

## 2011-03-10 MED ORDER — INDOMETHACIN 25 MG PO CAPS
50.0000 mg | ORAL_CAPSULE | Freq: Once | ORAL | Status: AC
  Administered 2011-03-10: 50 mg via ORAL
  Filled 2011-03-10: qty 1

## 2011-03-10 MED ORDER — INDOMETHACIN 25 MG PO CAPS
25.0000 mg | ORAL_CAPSULE | Freq: Four times a day (QID) | ORAL | Status: AC
  Administered 2011-03-10 – 2011-03-12 (×12): 25 mg via ORAL
  Filled 2011-03-10 (×12): qty 1

## 2011-03-10 NOTE — Progress Notes (Signed)
Received report from Corky Sing, RN @ [address], [place]

## 2011-03-10 NOTE — Progress Notes (Signed)
Informed about pt's constipation - order received (glycerin supp.)

## 2011-03-10 NOTE — Progress Notes (Signed)
Loading dose Indocin 50mg  given per order.  Uterine irritability noted

## 2011-03-10 NOTE — Progress Notes (Signed)
  S: Preterm labor symptoms: no complaints  O: Blood pressure 114/74, pulse 82, temperature 98 F (36.7 C), temperature source Oral, resp. rate 18, height 5\' 3"  (1.6 m), weight 72.576 kg (160 lb), last menstrual period 09/27/2010.   FHT:HR OK by doppler Toco: None SVE: deferred  A/P- 35 y.o. admitted with cervical insufficiency Preterm labor management: bedrest advised and pelvic rest advised Dating:  [redacted]w[redacted]d PNL Needed:  Offer flu vaccine/TDAP PTL:  No uterine activity

## 2011-03-10 NOTE — Progress Notes (Signed)
2210: Called to room, pt had sudden onset of nausea followed by large amount of vomit in bed. Pt denied further nausea. Pt to shower chair via w/c to bathe, linen changed, then back to bed.  Dr. Tamela Oddi notified of emesis occuring 30 minutes after receiving ambien and bystolic po and states to not repeat meds.  Cheral Marker

## 2011-03-11 ENCOUNTER — Encounter (HOSPITAL_COMMUNITY): Payer: Self-pay | Admitting: *Deleted

## 2011-03-11 MED ORDER — GLYCERIN (LAXATIVE) 2.1 G RE SUPP
1.0000 | RECTAL | Status: DC | PRN
  Filled 2011-03-11: qty 1

## 2011-03-11 NOTE — Progress Notes (Signed)
  S: Preterm labor symptoms:  Very anxious/tearful  O: Blood pressure 115/73, pulse 84, temperature 98 F (36.7 C), temperature source Oral, resp. rate 18, height 5\' 3"  (1.6 m), weight 72.576 kg (160 lb), last menstrual period 09/27/2010, unknown if currently breastfeeding.   FHT:FHR by doppler OK Toco: Some uterine irritability appreciated SVE: deferred  A/P- 35 y.o. admitted with cervical insufficiency Preterm labor management: Rx for Indomethacin 25 mg po tid, bedrest advised and pelvic rest advised Anxiety in setting of stress/poor OB history Dating:  [redacted]w[redacted]d Offer Social Work consult/possible Wellbutrin

## 2011-03-12 NOTE — Progress Notes (Signed)
  S: Pt worried about risk of delivering early due to previous poor OB hx. Having no pain or contractions.   O: BP 120/77  Pulse 81  Temp(Src) 97.9 F (36.6 C) (Oral)  Resp 18  Ht 5\' 3"  (1.6 m)  Wt 72.576 kg (160 lb)  BMI 28.34 kg/m2  LMP 09/27/2010  Breastfeeding? Unknown SVE: deferred Toco: occasional UI FHR via doppler: 141  A: Cervical insufficiency, with poor OB history.  P: Continue with bedrest, pelvic rest, Rx Indomethacin 25mg  q6hours PO.   Anice Paganini CNM 03/12/11 9:30AM

## 2011-03-12 NOTE — Progress Notes (Signed)
UR chart review completed.  

## 2011-03-13 NOTE — Progress Notes (Signed)
Patient ID: MAHAGONY GRIEB, female   DOB: [birth year], 35 y.o.   MRN: [id number]  S: Pt comfortable, denies contractions, cramping, bleeding or loss of fluid. Feels regular fetal movement. In good spirits today, has some knitting and other activities available.   O: BP 125/71  Pulse 84  Temp(Src) 98.1 F (36.7 C) (Oral)  Resp 18  Ht 5\' 3"  (1.6 m)  Wt 72.576 kg (160 lb)  BMI 28.34 kg/m2  LMP 09/27/2010  Breastfeeding? Unknown Voiding without difficulty, BM last night.  Uterus: soft and nontender, occasional UI on toco. Cervix: exam deferred.  FHR via doppler, 143bpm  A: Shortened cervix with poor OB hx.   P: Continue with bedrest, Indomethacin complete. Plan for Betamethasone on Saturday at 23 weeks, Repeat ultrasound for cervical length on Friday. Consult with Dr. Tamela Oddi as needed.   Anice Paganini CNM 03/13/11 07:52 AM

## 2011-03-14 NOTE — Progress Notes (Signed)
Patient ID: Melanie Moss, female   DOB: 1976-01-19, 35 y.o.   MRN: [id number]  S: Pt reports no contractions or cramping. Pt with no complaints.   O: BP 127/77  Pulse 83  Temp(Src) 98.1 F (36.7 C) (Oral)  Resp 18  Ht 5\' 3"  (1.6 m)  Wt 72.576 kg (160 lb)  BMI 28.34 kg/m2  LMP 09/27/2010  Breastfeeding? Unknown Toco: UI with no contractions FHR 140s by doppler  A/P: Shortened cervix Continue with Plan of care, F/U ultrasound on Friday. 17P on Thursday.   Anice Paganini CNM [id number] 08:45AM

## 2011-03-15 MED ORDER — SIMETHICONE 80 MG PO CHEW
80.0000 mg | CHEWABLE_TABLET | Freq: Four times a day (QID) | ORAL | Status: DC | PRN
  Administered 2011-03-15 – 2011-05-09 (×3): 80 mg via ORAL

## 2011-03-15 NOTE — Progress Notes (Signed)
Patient ID: Melanie Moss, female   DOB: 09/26/1975, 35 y.o.   MRN: [id number] S: Preterm labor symptoms: no complaints  O: Blood pressure 123/79, pulse 82, temperature 98 F (36.7 C), temperature source Oral, resp. rate 20, height 5\' 3"  (1.6 m), weight 72.077 kg (158 lb 14.4 oz), last menstrual period 09/27/2010, unknown if currently breastfeeding.   WUJ:WJXBJYNW: 150 bpm Toco: None  A/P- 35 y.o. admitted with cervical insufficiency Preterm labor management: bedrest advised and pelvic rest advised Dating:  [redacted]w[redacted]d Repeat U/S--assess cervical length

## 2011-03-16 ENCOUNTER — Inpatient Hospital Stay (HOSPITAL_COMMUNITY): Payer: Managed Care, Other (non HMO)

## 2011-03-16 ENCOUNTER — Other Ambulatory Visit (HOSPITAL_COMMUNITY): Payer: Managed Care, Other (non HMO)

## 2011-03-16 ENCOUNTER — Ambulatory Visit (HOSPITAL_COMMUNITY): Admission: RE | Admit: 2011-03-16 | Payer: Managed Care, Other (non HMO) | Source: Ambulatory Visit

## 2011-03-16 MED ORDER — ONDANSETRON 4 MG PO TBDP
4.0000 mg | ORAL_TABLET | Freq: Four times a day (QID) | ORAL | Status: DC | PRN
  Administered 2011-03-16 – 2011-04-02 (×4): 4 mg via ORAL
  Filled 2011-03-16 (×4): qty 1

## 2011-03-16 NOTE — Progress Notes (Signed)
Patient ID: Melanie Moss, female   DOB: [birth year], 35 y.o.   MRN: [id number] S: Preterm labor symptoms: none  O: Blood pressure 122/75, pulse 82, temperature 97.8 F (36.6 C), temperature source Oral, resp. rate 20, height 5\' 3"  (1.6 m), weight 72.077 kg (158 lb 14.4 oz), last menstrual period 09/27/2010, unknown if currently breastfeeding.   FHT:FHR OK by doppler Toco: None  A/P- 35 y.o. admitted with cervical insufficiency Preterm labor management: bedrest advised and pelvic rest advised Dating:  [redacted]w[redacted]d Check cervical length on U/S today

## 2011-03-16 NOTE — Progress Notes (Signed)
UR Chart review completed.  

## 2011-03-17 NOTE — Progress Notes (Signed)
When asked if you're feeling any contractions, Pt. States, "No, not at all."

## 2011-03-17 NOTE — Progress Notes (Signed)
Patient ID: Melanie Moss, female   DOB: Aug 21, 1975, 35 y.o.   MRN: [id number] Bile signs normal No leakage No cramping Status unchanged continue bedrest

## 2011-03-18 NOTE — Progress Notes (Signed)
Patient ID: Melanie Moss, female   DOB: 26-Aug-1975, 35 y.o.   MRN: [id number] Vital signs normal no change in her condition Continue present therapy and

## 2011-03-19 ENCOUNTER — Ambulatory Visit (HOSPITAL_COMMUNITY): Payer: Managed Care, Other (non HMO)

## 2011-03-19 MED ORDER — BETAMETHASONE SOD PHOS & ACET 6 (3-3) MG/ML IJ SUSP
12.0000 mg | Freq: Every day | INTRAMUSCULAR | Status: AC
  Administered 2011-03-19 – 2011-03-20 (×2): 12 mg via INTRAMUSCULAR
  Filled 2011-03-19 (×2): qty 2

## 2011-03-19 NOTE — Progress Notes (Signed)
Patient ID: Melanie Moss, female   DOB: 1975-05-22, 35 y.o.   MRN: [id number]  S: Pt comfortable, denies s/sx of preterm labor.   O: BP 126/75  Pulse 86  Temp(Src) 97.7 F (36.5 C) (Oral)  Resp 18  Ht 5\' 3"  (1.6 m)  Wt 72.077 kg (158 lb 14.4 oz)  BMI 28.15 kg/m2  LMP 09/27/2010  Breastfeeding? Unknown Fundus: soft and nontender, toco with occasional UI. Doppler FHR 145.  A/P: Pregnancy with hx recurrent losses. Stable on bedrest at present. Rx Betamethasone. Will consult with Dr. Tamela Oddi prn.   Anice Paganini CNM 03/19/11 8:08 AM

## 2011-03-19 NOTE — Progress Notes (Signed)
UR chart review completed.  

## 2011-03-20 NOTE — Progress Notes (Signed)
S: Pt denies s/sx of preterm labor, no cramping noted.   O: BP 119/74  Pulse 82  Temp(Src) 98.1 F (36.7 C) (Oral)  Resp 18  Ht 5\' 3"  (1.6 m)  Wt 72.077 kg (158 lb 14.4 oz)  BMI 28.15 kg/m2  LMP 09/27/2010  Breastfeeding? Unknown Toco: occasional UI noted FHR: 140s by doppler  A/P: IUP at [redacted]w[redacted]d with previous poor obstetric hx, incompetent cervix. Continue on bedrest with BSC, 2nd Betamethasone injection today. 17P due on Thursday.   Anice Paganini CNM 03/20/11 7:45AM

## 2011-03-21 NOTE — Progress Notes (Signed)
S: IUP at 23w 4d currently, no c/o preterm labor s/sx.   O: BP 133/81  Pulse 89  Temp(Src) 98.2 F (36.8 C) (Oral)  Resp 18  Ht 5\' 3"  (1.6 m)  Wt 72.077 kg (158 lb 14.4 oz)  BMI 28.15 kg/m2  LMP 09/27/2010  Breastfeeding? Unknown Toco: occasional UI noted. Fundus soft and nontender. FHR: 140s by doppler, Fetal movement noted.   A/P: 35 y.o. admitted with cervical insufficiency Preterm labor management: bedrest and pelvic rest continued. 17P injection tomorrow.   Anice Paganini CNM 03/21/11 7:55am

## 2011-03-22 MED ORDER — NIFEDIPINE ER 60 MG PO TB24
60.0000 mg | ORAL_TABLET | Freq: Every day | ORAL | Status: DC
  Administered 2011-03-22 – 2011-03-25 (×4): 60 mg via ORAL
  Filled 2011-03-22 (×5): qty 1

## 2011-03-22 NOTE — Progress Notes (Signed)
Patient ID: AANVI VOYLES, female   DOB: 11-20-1975, 35 y.o.   MRN: [id number] S: Preterm labor symptoms: Pt denies feeling contractions or cramping.  O: Blood pressure 130/82, pulse 79, temperature 98.1 F (36.7 C), temperature source Oral, resp. rate 18, height 5\' 3"  (1.6 m), weight 73.301 kg (161 lb 9.6 oz), last menstrual period 09/27/2010, unknown if currently breastfeeding.   FHT:140s by doppler Toco: Frequency: 3-6 times per hour, Duration: 30-80 seconds and Intensity: mild, pt does not feel contractions SVE:  deferred.   A/P- 35 y.o. admitted with preterm labor  and cervical insufficiency Preterm labor management: bedrest advised, pelvic rest advised and initiated Procardia XL 60mg  PO daily per consult with Dr. Tamela Oddi. Follow-up cervical length Friday 12/7.  Dating:  [redacted]w[redacted]d  Katara Griner Hamby CNM  8:24 AM 03/22/11

## 2011-03-23 NOTE — Progress Notes (Signed)
Patient ID: MARQUASHA BRUTUS, female   DOB: [birth year], 35 y.o.   MRN: [id number] S: Preterm labor symptoms: pt denies cramping, bleeding or contractions.   O: Blood pressure 120/66, pulse 77, temperature 98.2 F (36.8 C), temperature source Oral, resp. rate 18, height 5\' 3"  (1.6 m), weight 73.301 kg (161 lb 9.6 oz), last menstrual period 09/27/2010, unknown if currently breastfeeding.   FHT:140s by Doppler Toco: UI only, had increased contractions this morning just prior to Procardia dose at 0830. SVE:  deferred, next cervical length on 12/6  A/P- 35 y.o. admitted with preterm labor  and cervical insufficiency Preterm labor management: bedrest advised, pelvic rest advised and Procardia XL 60mg  PO daily. Dating:  [redacted]w[redacted]d  Anice Paganini CNM 03/23/11 9:40 AM

## 2011-03-23 NOTE — Progress Notes (Signed)
UR Chart review completed.  

## 2011-03-23 NOTE — Progress Notes (Signed)
23 6/[redacted] weeks gestation, with PTL, cervical insufficiency.  Height  63" Weight 161 Lbs pre-pregnancy weight 146 Lbs.Pre-pregnancy  BMI 25.9  IBW 115 Lbs  Total weight gain 15 Lbs. Weight gain goals 15 - 25 Lbs.   Estimated needs: 18-2000 kcal/day, 65-75 grams protein/day, 2 liters fluid/day Regular diet tolerated well, appetite good. Changed diet order to Antenatal regular as pt is requesting snacks No abnormal nutrition related labs  Nutrition Dx: Increased nutrient needs r/t pregnancy and fetal growth requirements aeb [redacted] weeks gestation.  No educational needs assessed at this time.

## 2011-03-24 NOTE — Progress Notes (Signed)
Patient ID: Melanie Moss, female   DOB: 1975-09-19, 35 y.o.   MRN: [id number] S: Preterm labor symptoms: none  O: Blood pressure 118/72, pulse 84, temperature 98 F (36.7 C), temperature source Oral, resp. rate 20, height 5\' 3"  (1.6 m), weight 73.301 kg (161 lb 9.6 oz), last menstrual period 09/27/2010, unknown if currently breastfeeding.   NFA:OZHYQMV FHR OK Toco: None  A/P- 35 y.o. admitted with cervical insufficiency Preterm labor management: bedrest advised and Procardia; d/c betablocker Dating:  [redacted]w[redacted]d PNL Needed:  2hr GTT  PTL:  Stable

## 2011-03-25 NOTE — Progress Notes (Signed)
Patient ID: Melanie Moss, female   DOB: 03-25-1976, 35 y.o.   MRN: [id number] S: Preterm labor symptoms: none  O: Blood pressure 121/78, pulse 82, temperature 98.2 F (36.8 C), temperature source Oral, resp. rate 20, height 5\' 3"  (1.6 m), weight 73.301 kg (161 lb 9.6 oz), last menstrual period 09/27/2010, unknown if currently breastfeeding.   FHT: 150s Toco: None  A/P- 35 y.o. admitted with cervical insufficiency Preterm labor management: bedrest advised and Procardia for tocolysis/antihypertensive Dating:  [redacted]w[redacted]d

## 2011-03-26 MED ORDER — MAGNESIUM SULFATE 40 G IN LACTATED RINGERS - SIMPLE
2.0000 g/h | INTRAVENOUS | Status: AC
  Administered 2011-03-26 – 2011-03-29 (×4): 2 g/h via INTRAVENOUS
  Filled 2011-03-26 (×4): qty 500

## 2011-03-26 MED ORDER — LACTATED RINGERS IV SOLN
INTRAVENOUS | Status: DC
  Administered 2011-03-26 – 2011-03-27 (×4): via INTRAVENOUS
  Administered 2011-03-28: 75 mL/h via INTRAVENOUS

## 2011-03-26 MED ORDER — MAGNESIUM SULFATE BOLUS VIA INFUSION
4.0000 g | Freq: Once | INTRAVENOUS | Status: AC
  Administered 2011-03-26: 4 g via INTRAVENOUS
  Filled 2011-03-26: qty 500

## 2011-03-26 MED ORDER — NEBIVOLOL HCL 5 MG PO TABS
15.0000 mg | ORAL_TABLET | Freq: Every day | ORAL | Status: DC
  Administered 2011-03-26 – 2011-04-06 (×12): 15 mg via ORAL
  Filled 2011-03-26 (×13): qty 1

## 2011-03-26 NOTE — Progress Notes (Signed)
Mag 4 GM load initiated

## 2011-03-26 NOTE — Progress Notes (Signed)
UR chart review completed.  

## 2011-03-26 NOTE — Progress Notes (Signed)
Pt educated on need for and benefits of Mag Sulfate to slow down PTL.  Verbalizes understanding and wishes to proceed.

## 2011-03-26 NOTE — Progress Notes (Signed)
Pt uterus irritable w/ UC's.  Encouraged pt to void.  Pt denies cramping or pain

## 2011-03-26 NOTE — Progress Notes (Signed)
Patient ID: Melanie Moss, female   DOB: May 07, 1975, 35 y.o.   MRN: [id number] S: Preterm labor symptoms: none  O: Blood pressure 122/78, pulse 87, temperature 98.4 F (36.9 C), temperature source Oral, resp. rate 18, height 5\' 3"  (1.6 m), weight 73.301 kg (161 lb 9.6 oz), last menstrual period 09/27/2010, SpO2 98.00%, unknown if currently breastfeeding.   ION:GEXBMWUX: 150 bpm Toco: None  12/02 0701 - 12/03 0700 In: 375.8 [I.V.:375.8] Out: 350 [Urine:350]   A/P- 34 y.o. admitted with cervical insufficiency Preterm labor management: bedrest advised and Magnesium sulfate for tocolysis Dating:  [redacted]w[redacted]d  PTL:  See above

## 2011-03-27 ENCOUNTER — Inpatient Hospital Stay (HOSPITAL_COMMUNITY): Payer: Managed Care, Other (non HMO)

## 2011-03-27 MED ORDER — LABETALOL HCL 5 MG/ML IV SOLN: 40.0000 mg | Freq: Once | INTRAVENOUS | Status: DC

## 2011-03-27 NOTE — Progress Notes (Signed)
Melanie Moss was seen for ultrasound appointment today.  Please see [facility]-OBGYN report for details.

## 2011-03-27 NOTE — Progress Notes (Signed)
Patient ID: Melanie Moss, female   DOB: [birth year], 35 y.o.   MRN: [id number] S: Preterm labor symptoms: none  O: Blood pressure 99/66, pulse 81, temperature 98.2 F (36.8 C), temperature source Oral, resp. rate 18, height 5\' 3"  (1.6 m), weight 73.301 kg (161 lb 9.6 oz), last menstrual period 09/27/2010, SpO2 98.00%, unknown if currently breastfeeding.   GEX:BMWUXLKG: 150 bpm Toco: None SVE: deferred  Total I/O In: 220 [P.O.:120; I.V.:100] Out: 0   12/03 0701 - 12/04 0700 In: 3300 [P.O.:900; I.V.:2400] Out: 3550 [Urine:3550]  A/P- 35 y.o. admitted with cervical insufficiency Preterm labor management: bedrest advised and MgSO4 for tocolysis Dating:  [redacted]w[redacted]d PNL Needed:  2hr GTT PTL:  See above MFM & U/S f/u

## 2011-03-28 LAB — GLUCOSE, FASTING: Glucose, Fasting: 99 mg/dL (ref 70–99)

## 2011-03-28 LAB — GLUCOSE, 2 HOUR: Glucose, 2 hour: 188 mg/dL — ABNORMAL HIGH (ref 70–139)

## 2011-03-28 NOTE — Progress Notes (Signed)
Patient ID: ARFA LAMARCA, female   DOB: 10/25/1975, 35 y.o.   MRN: [id number] S: Preterm labor symptoms: denies symptoms today.   O: Blood pressure 108/64, pulse 75, temperature 98.3 F (36.8 C), temperature source Oral, resp. rate 16, height 5\' 3"  (1.6 m), weight 73.301 kg (161 lb 9.6 oz), last menstrual period 09/27/2010, SpO2 98.00%, unknown if currently breastfeeding.   FHT:140s Doppler Toco: UI only SVE:  deferred, cervix unchanged per MFM ultrasound.   A/P- 35 y.o. admitted with preterm labor  and cervical insufficiency Preterm labor management: bedrest advised, pelvic rest advised and On magnesium for 72 hours, to be D/Cd at 0300 03/29/11. If begins to contract again will repeat Indomethacin.  Continue 17P injection as scheduled weekly. Ultrasound indicates appropriate interval growth, and right sided fetal pyelectasis.  Dating:  [redacted]w[redacted]d PNL Needed:  glucose tolerance test in progress this morning.   Anice Paganini CNM 03/28/11  8:18 AM

## 2011-03-28 NOTE — Progress Notes (Signed)
Nutrition follow-up/ Diet Education  Noted GTT results: serum glucose 99 fasting and 188, 2 hour. Diagnostic of GDM > 95 fasting and > 155, 2 hr  Because pt. Is suspected of Dx of GDM and Dietitian unavailable to complete diet education 12/6 - 12/9, diet education was completed with pt this afternoon.  Explained to pt. that  the Physician would be the one to definitively Dx with GDM. She understand that the Physician may order a change in diet to the CHO mod gestational diet tomorrow.  Reviewed with pt, why she had to follow diet, serum glucose goals, and parameters of CHO mod gestational diet. A copy of My meal plan for Gestational Diabetes was reviewed and given to pt. She verbalized under standing.  Nutrition Dx: Food and nutrition-related knowledge deficit r/t no previous education aeb newly diagnosed GDM.

## 2011-03-29 DIAGNOSIS — O24419 Gestational diabetes mellitus in pregnancy, unspecified control: Secondary | ICD-10-CM | POA: Diagnosis not present

## 2011-03-29 LAB — GLUCOSE, CAPILLARY
Glucose-Capillary: 82 mg/dL (ref 70–99)
Glucose-Capillary: 136 mg/dL — ABNORMAL HIGH (ref 70–99)
Glucose-Capillary: 108 mg/dL — ABNORMAL HIGH (ref 70–99)
Glucose-Capillary: 103 mg/dL — ABNORMAL HIGH (ref 70–99)

## 2011-03-29 NOTE — Progress Notes (Signed)
UR Chart review completed.  

## 2011-03-29 NOTE — Progress Notes (Signed)
Patient ID: Melanie Moss, female   DOB: [birth year], 35 y.o.   MRN: [id number] S: Preterm labor symptoms: none  O: Blood pressure 116/63, pulse 72, temperature 97.8 F (36.6 C), temperature source Oral, resp. rate 20, height 5\' 3"  (1.6 m), weight 72.576 kg (160 lb), last menstrual period 09/27/2010, SpO2 98.00%, unknown if currently breastfeeding.   XBM:WUXLKGMW: 150 bpm Toco: None SVE: deferred  A/P- 35 y.o. admitted with cervical insufficiency Preterm labor management: bedrest advised Dating:  [redacted]w[redacted]d GDM--diet, check CBGs  FWB:  Appropriate growth on U/S.  Repeat in 3 weeks. PTL:  Cervix stable on U/S

## 2011-03-30 LAB — GLUCOSE, CAPILLARY
Glucose-Capillary: 85 mg/dL (ref 70–99)
Glucose-Capillary: 78 mg/dL (ref 70–99)
Glucose-Capillary: 118 mg/dL — ABNORMAL HIGH (ref 70–99)
Glucose-Capillary: 103 mg/dL — ABNORMAL HIGH (ref 70–99)

## 2011-03-30 MED ORDER — INDOMETHACIN 25 MG PO CAPS
25.0000 mg | ORAL_CAPSULE | Freq: Four times a day (QID) | ORAL | Status: DC
  Administered 2011-03-30 – 2011-04-02 (×13): 25 mg via ORAL
  Filled 2011-03-30 (×17): qty 1

## 2011-03-30 MED ORDER — INDOMETHACIN 50 MG PO CAPS
50.0000 mg | ORAL_CAPSULE | Freq: Once | ORAL | Status: AC
  Administered 2011-03-30: 50 mg via ORAL
  Filled 2011-03-30: qty 1

## 2011-03-30 NOTE — Progress Notes (Signed)
Pt encouraged to get up and used BSC to keep bladder empty and to drink plenty of fluids. Pt agreed.

## 2011-03-30 NOTE — Progress Notes (Signed)
Patient ID: Melanie Moss, female   DOB: [birth year], 35 y.o.   MRN: [id number] S: Preterm labor symptoms: none  O: Blood pressure 104/57, pulse 69, temperature 98.4 F (36.9 C), temperature source Oral, resp. rate 18, height 5\' 3"  (1.6 m), weight 72.576 kg (160 lb), last menstrual period 09/27/2010, SpO2 98.00%, unknown if currently breastfeeding.   NWG:NFAOZHYQ: 150 bpm; prolonged variable deceleration x 1 Toco: None SVE:  deferred  CBG (last 3)   Basename 03/30/11 1249 03/30/11 0620 03/29/11 2229  GLUCAP 78 85 103*     A/P- 35 y.o. admitted with cervical insufficiency Preterm labor management: Rx for Indomethacin 25 mg po tid and bedrest advised Gestational diabetes--CBGs in range on diet Dating:  [redacted]w[redacted]d  FWB:  FHT overall reassuring PTL:  Minimal uterine activity at present

## 2011-03-30 NOTE — Consult Note (Signed)
Spoke with this mother after request from nursing staff - she is currently 24 wk 6 days gestation and has a past history of multiple pregnancy loss. She has been on antenatal unit for observation after a diagnosis of cervical insufficiency and placement of a cerclage.  Currently on tocolytic, Magnesium sulfate and Indocin, and has received BMZ.  Per the RN there has been a recent multiple minute interval of deceleration.  Mother was alone and was awake and lucid.  I outlined the setting of the NICU and our experience with pregnancies this early, our 24/7 availability in the hospital and then spoke generally about survival statistics, empathizing the word statistics for what they were, generalizations, telling us nothing about exactly how her infant would fare. I empathiized the value of the BMZ treatment and the efforts she, the mother, was doing to hold onto the pregnancy such as bedrest, distracting activities, etc... I spoke strongly about the value of family members who could read to mother, or do other sedentary activities that would improve her restriction to bedrest.    Communications was empathized during the period the infant was in the NICU because of the typical high emotional tension existing for parents and families and especially early in the infant's course. Mom plans to breast feed and this was empathized as a very important and positive effort she could pursue on the infant's behalf. A general discussion of the staff and the importance of iv nutrition until the infant could receive all his/her nutrition enterally and both grow and thrive was done.   Mother seemed to attend to the information I imparted and did not have any questions.     From the record it also appears she has diabetes mellitis that is under control at present.    Dagoberto Ligas MD [facility]

## 2011-03-30 NOTE — Progress Notes (Signed)
Clinical Social Work Department ANTENATAL PSYCHOSOCIAL ASSESSMENT  Name: Melanie Moss  DOB: 11-Feb-1976 Age: 35 Admit date: 03/09/11  Gestational age on admission: [redacted]w[redacted]d Admitting Diagnosis: cervical insufficiency Expected Delivery date: 07/14/11  I. FAMILY/HOME ENVIRONMENT Home address: [address] [place], [place] [address]  A. Household Members/Support: Gaylyn Cheers Name: Relationship: Age:   Name: Relationship: Age:  Name: Relationship: Age:  B. Other Support: Sister (lives in [place]), Family ([place], [place]), Mother-in-law (lives in [place]), friends/coworkers  II. PSYCHOSOCIAL DATA A. Information Source: _x_Patient Interview __Family Interview _x_Other: chart B. Employment: Patient works for [employer], husband works for [employer]. Medicaid (County)_________Pvt. Insurance______Aetna______  SELF PAY ______        Food Stamps ____  WIC____  Work First__ Public Housing__ Section 8___         [employer] Case Mgr____________    School _________________ Current                    Grade________ Homebound arranged? Y/N___________ D. Cultural/Environment Issues Impacting Care: none known  III. STRENGTHS/WEAKNESSES/FACTORS TO CONSIDER A. Concerns related to hospitalization?: Patient states she is fine with being here on bed rest.  She is just concerned about baby and what the outcome of this pregnancy will be.   B. Previous pregnancies/feelings towards pregnancy? 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  She feels good that she is being monitored so closely.  It is evident that she wants to become a mother.   Social Support: (FOB? Who is/will be helping with baby/other kids): Patient has a great support system.  She states that her husband comes to see her every evening after work and that his mother often visits from [place].  She said her family comes from [place] when they come, but keep  in touch by phone on a regular basis.  She states her coworkers and friends have been great.  She states she is "keeping the faith" that things will be okay.   Couple's relationship: (describe): Patient states they have a good relationship and he has been very supportive and is just worried about her.   Recent stressful life events: (life changes in past year?) 4 previous miscarriages.   C. Prenatal care/education/home preparations?: Did not discuss at this time.   D. Domestic Violence(of any type)? YES__________    NO_____x_____         If yes, describe/action plan:   Substance use during pregnancy?   YES______  NO____x____ (if YES, complete SBIRT)_______           E. Complete PHQ-9(Depression Screening) Did not complete at this time.                PHQ-9 SCORE=_________  (IF SCORE > 15 complete TREAT________ J.          Follow up Recommendations:                Patient advised/response?   K.        Other:     CLINICAL ASSESSMENT/PLAN: Patient was extremely pleasant and seems to be coping well with the situation.  She states that she has her "tear jerking moments," but understands that this is normal.  She thinks that she is handling the situation well.  She does not identify any needs at this time.  SW asked if she had had a consult with a neonatalogist yet and she said no, but thought this would be  helpful.  SW spoke to Dr. Alison Murray and Dr. Wimmer/Neonatalogists, who state that a neo will meet with her.

## 2011-03-31 LAB — GLUCOSE, CAPILLARY
Glucose-Capillary: 102 mg/dL — ABNORMAL HIGH (ref 70–99)
Glucose-Capillary: 117 mg/dL — ABNORMAL HIGH (ref 70–99)
Glucose-Capillary: 116 mg/dL — ABNORMAL HIGH (ref 70–99)
Glucose-Capillary: 104 mg/dL — ABNORMAL HIGH (ref 70–99)

## 2011-03-31 NOTE — Progress Notes (Signed)
Patient ID: Melanie Moss, female   DOB: [birth year], 35 y.o.   MRN: [id number] Bile signs normal No complaints No contractions Status unchanged and

## 2011-03-31 NOTE — Progress Notes (Signed)
RN to the bedside for a.m. Fetal monitoring - unable to locate fetal heart rate with external monitoring - doppler (150's).  Rn will continue to try and obtain continuous fetal monitoring with external monitor.

## 2011-03-31 NOTE — Progress Notes (Addendum)
RN to the bedside - decreased FHR - 50's for 10 sec, then to 80's for 3 min (est.) unable to obtain continuous fetal tracing -  Pulse ox sensor placed on mother's left index finger - pulse 70's. Once FHR located -130's  With gradual return to baseline of 130's.

## 2011-03-31 NOTE — Progress Notes (Signed)
Dr. Gaynell Face notified, no new orders received - pt. Can be removed from [facility] - continue with toco monitoring.

## 2011-04-01 LAB — GLUCOSE, CAPILLARY
Glucose-Capillary: 90 mg/dL (ref 70–99)
Glucose-Capillary: 111 mg/dL — ABNORMAL HIGH (ref 70–99)
Glucose-Capillary: 127 mg/dL — ABNORMAL HIGH (ref 70–99)
Glucose-Capillary: 96 mg/dL (ref 70–99)

## 2011-04-01 NOTE — Progress Notes (Signed)
Patient ID: SILVA AAMODT, female   DOB: [birth year], 34 y.o.   MRN: [id number] Vital signs normal No change No complaints and

## 2011-04-02 LAB — GLUCOSE, CAPILLARY
Glucose-Capillary: 103 mg/dL — ABNORMAL HIGH (ref 70–99)
Glucose-Capillary: 111 mg/dL — ABNORMAL HIGH (ref 70–99)
Glucose-Capillary: 114 mg/dL — ABNORMAL HIGH (ref 70–99)
Glucose-Capillary: 116 mg/dL — ABNORMAL HIGH (ref 70–99)

## 2011-04-02 NOTE — Progress Notes (Signed)
Patient ID: TYREONA PANJWANI, female   DOB: 1975/10/03, 35 y.o.   MRN: [id number] S: Preterm labor symptoms: none today  O: Blood pressure 115/71, pulse 79, temperature 98.4 F (36.9 C), temperature source Oral, resp. rate 18, height 5\' 3"  (1.6 m), weight 72.576 kg (160 lb), last menstrual period 09/27/2010, SpO2 98.00%, unknown if currently breastfeeding.   UYQ:IHKVQQVZ: 135 bpm, Variability: Good {> 6 bpm), Accelerations: Non-reactive but appropriate for gestational age and Decelerations: Absent Toco: None and occasional UI. SVE:  deferred  A/P- 35 y.o. admitted with preterm labor  and cervical insufficiency, Gestational diabetes. Preterm labor management: bedrest advised, pelvic rest advised and DC Indocin today, continue diabetic diet.  Dating:  [redacted]w[redacted]d PNL Needed:  None  Anice Paganini CNM 04/02/11 @ 11:55 AM

## 2011-04-02 NOTE — Progress Notes (Signed)
UR chart review completed.  

## 2011-04-03 LAB — GLUCOSE, CAPILLARY
Glucose-Capillary: 85 mg/dL (ref 70–99)
Glucose-Capillary: 94 mg/dL (ref 70–99)
Glucose-Capillary: 111 mg/dL — ABNORMAL HIGH (ref 70–99)
Glucose-Capillary: 131 mg/dL — ABNORMAL HIGH (ref 70–99)

## 2011-04-03 NOTE — Progress Notes (Signed)
Patient ID: Melanie Moss, female   DOB: 09-27-1975, 35 y.o.   MRN: [id number] S: Preterm labor symptoms: pelvic pressure  O: Blood pressure 104/63, pulse 81, temperature 98.5 F (36.9 C), temperature source Oral, resp. rate 16, height 5\' 3"  (1.6 m), weight 72.576 kg (160 lb), last menstrual period 09/27/2010, SpO2 98.00%, unknown if currently breastfeeding.   WUJ:WJXBJYNW: 150 bpm Toco: occ. SVE:   A/P- 35 y.o. admitted with preterm labor  Preterm labor management: bedrest advised Dating:  [redacted]w[redacted]d PNL Needed:  none FWB:  good PTL:  stable ROD: n/a

## 2011-04-04 LAB — GLUCOSE, CAPILLARY
Glucose-Capillary: 86 mg/dL (ref 70–99)
Glucose-Capillary: 81 mg/dL (ref 70–99)
Glucose-Capillary: 122 mg/dL — ABNORMAL HIGH (ref 70–99)
Glucose-Capillary: 113 mg/dL — ABNORMAL HIGH (ref 70–99)

## 2011-04-04 MED ORDER — NIFEDIPINE ER 30 MG PO TB24
30.0000 mg | ORAL_TABLET | Freq: Two times a day (BID) | ORAL | Status: DC
  Administered 2011-04-04 – 2011-05-19 (×90): 30 mg via ORAL
  Filled 2011-04-04 (×93): qty 1

## 2011-04-04 MED ORDER — OXYCODONE-ACETAMINOPHEN 5-325 MG PO TABS
1.0000 | ORAL_TABLET | ORAL | Status: DC | PRN
  Administered 2011-04-04: 2 via ORAL
  Filled 2011-04-04: qty 2

## 2011-04-04 NOTE — Progress Notes (Signed)
Patient ID: Melanie Moss, female   DOB: April 27, 1975, 35 y.o.   MRN: [id number] S: Preterm labor symptoms: occasional UC's.  O: Blood pressure 122/72, pulse 71, temperature 98.2 F (36.8 C), temperature source Oral, resp. rate 18, height 5\' 3"  (1.6 m), weight 68.357 kg (150 lb 11.2 oz), last menstrual period 09/27/2010, SpO2 98.00%, unknown if currently breastfeeding.   WUJ:WJXBJYNW: 150 bpm Toco: None SVE:   A/P- 35 y.o. admitted with preterm labor  Preterm labor management: Bedrest.  Procardia. Dating:  [redacted]w[redacted]d PNL Needed:  none FWB:  good PTL:  stable

## 2011-04-04 NOTE — Progress Notes (Signed)
Rn to the bedside to locate fetal heart rate. Doppler used - FHR 140's; center of lower abd.  External monitor applied.

## 2011-04-05 LAB — GLUCOSE, CAPILLARY
Glucose-Capillary: 88 mg/dL (ref 70–99)
Glucose-Capillary: 89 mg/dL (ref 70–99)
Glucose-Capillary: 122 mg/dL — ABNORMAL HIGH (ref 70–99)

## 2011-04-05 NOTE — Progress Notes (Signed)
UR Chart review completed.  

## 2011-04-05 NOTE — Progress Notes (Signed)
Patient ID: Melanie Moss, female   DOB: February 16, 1976, 35 y.o.   MRN: [id number] S: Preterm labor symptoms: occasional UC's.  O: Blood pressure 103/61, pulse 81, temperature 98.3 F (36.8 C), temperature source Oral, resp. rate 18, height 5\' 3"  (1.6 m), weight 68.357 kg (150 lb 11.2 oz), last menstrual period 09/27/2010, SpO2 98.00%, unknown if currently breastfeeding.   WUJ:WJXBJYNW: 150 bpm Toco: occ., mild. SVE:  A/P- 35 y.o. admitted with preterm labor  Preterm labor management: bedrest advised Dating:  [redacted]w[redacted]d PNL Needed:  none FWB:  good PTL:  stable

## 2011-04-06 LAB — GLUCOSE, CAPILLARY
Glucose-Capillary: 123 mg/dL — ABNORMAL HIGH (ref 70–99)
Glucose-Capillary: 88 mg/dL (ref 70–99)
Glucose-Capillary: 81 mg/dL (ref 70–99)
Glucose-Capillary: 124 mg/dL — ABNORMAL HIGH (ref 70–99)
Glucose-Capillary: 103 mg/dL — ABNORMAL HIGH (ref 70–99)

## 2011-04-06 NOTE — Progress Notes (Signed)
Patient ID: Melanie Moss, female   DOB: Dec 08, 1975, 35 y.o.   MRN: [id number] S: Preterm labor symptoms: occasional UC's.  O: Blood pressure 101/60, pulse 81, temperature 98.2 F (36.8 C), temperature source Oral, resp. rate 18, height 5\' 3"  (1.6 m), weight 68.357 kg (150 lb 11.2 oz), last menstrual period 09/27/2010, SpO2 98.00%, unknown if currently breastfeeding.   WUJ:WJXBJYNW: 150 bpm Toco: occ. UC's. SVE:   A/P- 35 y.o. admitted with preterm labor  Preterm labor management: bedrest advised Dating:  [redacted]w[redacted]d PNL Needed:  none FWB:  good PTL:  stable

## 2011-04-07 LAB — GLUCOSE, CAPILLARY
Glucose-Capillary: 89 mg/dL (ref 70–99)
Glucose-Capillary: 135 mg/dL — ABNORMAL HIGH (ref 70–99)
Glucose-Capillary: 105 mg/dL — ABNORMAL HIGH (ref 70–99)
Glucose-Capillary: 138 mg/dL — ABNORMAL HIGH (ref 70–99)
Glucose-Capillary: 145 mg/dL — ABNORMAL HIGH (ref 70–99)

## 2011-04-07 NOTE — Progress Notes (Signed)
Patient ID: Melanie Moss, female   DOB: [birth year], 35 y.o.   MRN: [id number] S: Preterm labor symptoms: none  O: Blood pressure 97/56, pulse 71, temperature 98.1 F (36.7 C), temperature source Oral, resp. rate 18, height 5\' 3"  (1.6 m), weight 68.357 kg (150 lb 11.2 oz), last menstrual period 09/27/2010, SpO2 98.00%, unknown if currently breastfeeding.   NWG:NFAOZHYQ: 150 bpm Toco: None SVE: deferred  A/P- 35 y.o. admitted with gestational diabetes and preterm labor  Preterm labor management: bedrest advised Dating:  [redacted]w[redacted]d CBGs OK PTL:  Continue Procardia

## 2011-04-08 LAB — GLUCOSE, CAPILLARY
Glucose-Capillary: 91 mg/dL (ref 70–99)
Glucose-Capillary: 87 mg/dL (ref 70–99)
Glucose-Capillary: 108 mg/dL — ABNORMAL HIGH (ref 70–99)
Glucose-Capillary: 114 mg/dL — ABNORMAL HIGH (ref 70–99)

## 2011-04-08 NOTE — Progress Notes (Signed)
Patient ID: Melanie Moss, female   DOB: [birth year], 35 y.o.   MRN: [id number] S: Preterm labor symptoms: none  O: Blood pressure 106/67, pulse 81, temperature 98 F (36.7 C), temperature source Oral, resp. rate 18, height 5\' 3"  (1.6 m), weight 68.357 kg (150 lb 11.2 oz), last menstrual period 09/27/2010, SpO2 98.00%, unknown if currently breastfeeding.  CBG (last 3)   Basename 04/08/11 0606 04/07/11 2157 04/07/11 2150  GLUCAP 91 138* 145*      WUJ:WJXBJYNW: 150 bpm, Variability: Fair (1-6 bpm) and Decelerations: Variable: intermittent Toco: None SVE: deferred  A/P- 35 y.o. admitted with gestational diabetes and preterm labor  Preterm labor management: bedrest advised, pelvic rest advised and Procardia, maintenance Dating:  [redacted]w[redacted]d CBGs: borderline postprandial--continue to monitor PTL:  See above

## 2011-04-09 LAB — GLUCOSE, CAPILLARY
Glucose-Capillary: 85 mg/dL (ref 70–99)
Glucose-Capillary: 95 mg/dL (ref 70–99)
Glucose-Capillary: 84 mg/dL (ref 70–99)
Glucose-Capillary: 99 mg/dL (ref 70–99)

## 2011-04-09 NOTE — Progress Notes (Signed)
UR chart review completed.  

## 2011-04-09 NOTE — Progress Notes (Signed)
Patient ID: Melanie Moss, female   DOB: Apr 24, 1975, 35 y.o.   MRN: [id number] S: Preterm labor symptoms: UC's  O: Blood pressure 119/71, pulse 92, temperature 97.7 F (36.5 C), temperature source Oral, resp. rate 20, height 5\' 3"  (1.6 m), weight 68.357 kg (150 lb 11.2 oz), last menstrual period 09/27/2010, SpO2 98.00%, unknown if currently breastfeeding.   LKG:MWNUUVOZ: 150 bpm Toco: occasional SVE:   A/P- 35 y.o. admitted with preterm labor  Preterm labor management: bedrest advised Dating:  [redacted]w[redacted]d PNL Needed:  none FWB:  good PTL:  none

## 2011-04-10 LAB — GLUCOSE, CAPILLARY
Glucose-Capillary: 93 mg/dL (ref 70–99)
Glucose-Capillary: 79 mg/dL (ref 70–99)
Glucose-Capillary: 113 mg/dL — ABNORMAL HIGH (ref 70–99)
Glucose-Capillary: 133 mg/dL — ABNORMAL HIGH (ref 70–99)

## 2011-04-10 NOTE — Progress Notes (Signed)
Patient ID: SIRIYAH AMBROSIUS, female   DOB: 1975/09/23, 35 y.o.   MRN: [id number] S: Preterm labor symptoms: denies any symptoms.   O: Blood pressure 98/58, pulse 90, temperature 98.3 F (36.8 C), temperature source Oral, resp. rate 18, height 5\' 3"  (1.6 m), weight 68.357 kg (150 lb 11.2 oz), last menstrual period 09/27/2010, SpO2 98.00%, unknown if currently breastfeeding.   WUJ:WJXBJYNW: 140 bpm, Variability: Good {> 6 bpm), Accelerations: Non-reactive but appropriate for gestational age and Decelerations: Absent  Toco: Mild UI only SVE: deferred  A/P- 35 y.o. admitted with gestational diabetes and preterm labor  Preterm labor management: continues on Procardia, bedrest and pelvic rest. Diabetic diet. Ultrasound repeat next week for growth.  Dating:  [redacted]w[redacted]d  HAMBY, Dellie Piasecki 04/10/2011 12:12 PM

## 2011-04-11 ENCOUNTER — Ambulatory Visit (HOSPITAL_COMMUNITY): Payer: Managed Care, Other (non HMO)

## 2011-04-11 LAB — RUBELLA ANTIBODY, IGM: Rubella: IMMUNE

## 2011-04-11 LAB — GLUCOSE, CAPILLARY
Glucose-Capillary: <10 mg/dL — CL (ref 70–99)
Glucose-Capillary: 83 mg/dL (ref 70–99)
Glucose-Capillary: 89 mg/dL (ref 70–99)
Glucose-Capillary: 110 mg/dL — ABNORMAL HIGH (ref 70–99)
Glucose-Capillary: 108 mg/dL — ABNORMAL HIGH (ref 70–99)

## 2011-04-11 LAB — ABO/RH: RH Type: POSITIVE

## 2011-04-11 LAB — RPR: RPR: NONREACTIVE

## 2011-04-11 NOTE — Progress Notes (Addendum)
SW checked in with pt to see how she is doing.  She was extremely pleasant and states that she is doing well.  She told SW that she is not always getting her snacks and that she has gestational diabetes and is often hungry.  SW will look into this and get back to pt.  She states no other needs or concerns at this time.

## 2011-04-11 NOTE — Progress Notes (Signed)
SW spoke to [employer] to discuss pt's concerns with not receiving her snacks regularly.  He informed SW that the snacks now come from the nurses so pt should ask her nurse when she needs a snack.  If she ever wants anything more substantial than what they can provide on the unit, she may call room service at any time.  SW called pt to inform her of this and also spoke with Caroline/RN.  Pt was very pleasant and appreciative as usual.

## 2011-04-11 NOTE — Progress Notes (Signed)
Patient ID: LOISTINE EBERLIN, female   DOB: 22-Sep-1975, 35 y.o.   MRN: [id number] S: Preterm labor symptoms: none  O: Blood pressure 110/71, pulse 76, temperature 98.1 F (36.7 C), temperature source Oral, resp. rate 18, height 5\' 3"  (1.6 m), weight 68.357 kg (150 lb 11.2 oz), last menstrual period 09/27/2010, SpO2 98.00%, unknown if currently breastfeeding.   WUJ:WJXBJYNW: 140 bpm, Variability: Good {> 6 bpm), Accelerations: Reactive and Decelerations: Absent Toco: occasional UI SVE:  deferred, cerclage in place.  A/P- 35 y.o. admitted with gestational diabetes and preterm labor  Preterm labor management: bedrest advised, pelvic rest advised and Continue Procardia 30mg  q12h, and Diabetic diet. Ultrasound to be repeated next week.   Dating:  [redacted]w[redacted]d   HAMBY, REBECCA 04/11/2011 7:54 AM

## 2011-04-12 LAB — GLUCOSE, CAPILLARY
Glucose-Capillary: 88 mg/dL (ref 70–99)
Glucose-Capillary: 83 mg/dL (ref 70–99)
Glucose-Capillary: 134 mg/dL — ABNORMAL HIGH (ref 70–99)
Glucose-Capillary: 133 mg/dL — ABNORMAL HIGH (ref 70–99)

## 2011-04-12 NOTE — Progress Notes (Signed)
MD made aware of decel followed by one hour of reassurring FHR.  Per MD may d/c monitoring.

## 2011-04-12 NOTE — Progress Notes (Signed)
Patient ID: Melanie Moss, female   DOB: [birth year], 35 y.o.   MRN: [id number] S: Preterm labor symptoms: none  O: Blood pressure 102/71, pulse 90, temperature 98 F (36.7 C), temperature source Oral, resp. rate 18, height 5\' 3"  (1.6 m), weight 69.355 kg (152 lb 14.4 oz), last menstrual period 09/27/2010, SpO2 98.00%, unknown if currently breastfeeding.   NWG:NFAOZHYQ: 140 bpm Toco: None CBG (last 3)   Basename 04/12/11 0610 04/11/11 2138 04/11/11 1840  GLUCAP 88 108* 110*    A/P- 35 y.o. admitted with gestational diabetes and preterm labor  Preterm labor management: bedrest advised, pelvic rest advised and Procardia maintenance Dating:  [redacted]w[redacted]d

## 2011-04-13 LAB — GLUCOSE, CAPILLARY
Glucose-Capillary: 86 mg/dL (ref 70–99)
Glucose-Capillary: 109 mg/dL — ABNORMAL HIGH (ref 70–99)
Glucose-Capillary: 116 mg/dL — ABNORMAL HIGH (ref 70–99)
Glucose-Capillary: 90 mg/dL (ref 70–99)

## 2011-04-13 NOTE — Progress Notes (Signed)
UR Chart review completed.  

## 2011-04-13 NOTE — Progress Notes (Signed)
Patient ID: Melanie Moss, female   DOB: 04-27-1975, 35 y.o.   MRN: [id number] S: Preterm labor symptoms: uterine contractions  O: Blood pressure 112/68, pulse 78, temperature 98.4 F (36.9 C), temperature source Oral, resp. rate 20, height 5\' 3"  (1.6 m), weight 69.355 kg (152 lb 14.4 oz), last menstrual period 09/27/2010, SpO2 98.00%, unknown if currently breastfeeding.   FAO:ZHYQMVHQ: 150 bpm Toco: occasional, mild. SVE:   A/P- 35 y.o. admitted with preterm labor  Preterm labor management: bedrest advised Dating:  [redacted]w[redacted]d PNL Needed:  none FWB:  good PTL:  stable ROD: n/a

## 2011-04-14 LAB — GLUCOSE, CAPILLARY
Glucose-Capillary: 89 mg/dL (ref 70–99)
Glucose-Capillary: 95 mg/dL (ref 70–99)
Glucose-Capillary: 109 mg/dL — ABNORMAL HIGH (ref 70–99)
Glucose-Capillary: 116 mg/dL — ABNORMAL HIGH (ref 70–99)

## 2011-04-14 NOTE — Progress Notes (Signed)
Patient ID: Melanie Moss, female   DOB: [birth year], 35 y.o.   MRN: [id number] S: Preterm labor symptoms: pelvic pressure  O: Blood pressure 119/70, pulse 97, temperature 97.8 F (36.6 C), temperature source Oral, resp. rate 18, height 5\' 3"  (1.6 m), weight 69.355 kg (152 lb 14.4 oz), last menstrual period 09/27/2010, SpO2 98.00%, unknown if currently breastfeeding.   WUJ:WJXBJYNW: 150 bpm Toco: Occasional UC's. SVE:   A/P- 35 y.o. admitted with preterm labor  Preterm labor management: bedrest advised Dating:  [redacted]w[redacted]d PNL Needed:  none FWB:  Good PTL:  Stable

## 2011-04-15 LAB — GLUCOSE, CAPILLARY
Glucose-Capillary: 88 mg/dL (ref 70–99)
Glucose-Capillary: 104 mg/dL — ABNORMAL HIGH (ref 70–99)
Glucose-Capillary: 80 mg/dL (ref 70–99)
Glucose-Capillary: 112 mg/dL — ABNORMAL HIGH (ref 70–99)

## 2011-04-15 NOTE — Progress Notes (Signed)
Patient ID: Melanie Moss, female   DOB: December 28, 1975, 35 y.o.   MRN: [id number] S: Preterm labor symptoms: pelvic pressure  O: Blood pressure 110/70, pulse 84, temperature 98.1 F (36.7 C), temperature source Oral, resp. rate 18, height 5\' 3"  (1.6 m), weight 69.355 kg (152 lb 14.4 oz), last menstrual period 09/27/2010, SpO2 98.00%, unknown if currently breastfeeding.   NWG:NFAOZHYQ: 150 bpm Toco: occasional SVE:   A/P- 35 y.o. admitted with preterm labor  Preterm labor management: bedrest advised Dating:  [redacted]w[redacted]d PNL Needed:  none FWB:  good PTL:  stable ROD: n/a

## 2011-04-16 LAB — GLUCOSE, CAPILLARY
Glucose-Capillary: 93 mg/dL (ref 70–99)
Glucose-Capillary: 88 mg/dL (ref 70–99)
Glucose-Capillary: 106 mg/dL — ABNORMAL HIGH (ref 70–99)
Glucose-Capillary: 111 mg/dL — ABNORMAL HIGH (ref 70–99)

## 2011-04-16 NOTE — Progress Notes (Signed)
UR chart review completed.  

## 2011-04-16 NOTE — Progress Notes (Signed)
Patient ID: Melanie Moss, female   DOB: 01-08-1976, 35 y.o.   MRN: [id number] S: Preterm labor symptoms: no complaints  O: Blood pressure 125/81, pulse 100, temperature 98.3 F (36.8 C), temperature source Oral, resp. rate 18, height 5\' 3"  (1.6 m), weight 69.355 kg (152 lb 14.4 oz), last menstrual period 09/27/2010, SpO2 98.00%, unknown if currently breastfeeding.  CBG (last 3)   Basename 04/16/11 0605 04/15/11 2202 04/15/11 1623  GLUCAP 93 112* 80      ZHY:QMVHQION: 140 bpm and Variability: Good {> 6 bpm) Toco: None SVE: Deferred  A/P- 35 y.o. admitted with gestational diabetes and preterm labor  Preterm labor management: bedrest advised and Procardia maintenance Dating:  [redacted]w[redacted]d CBGs OK  PTL:  See above

## 2011-04-17 LAB — GLUCOSE, CAPILLARY
Glucose-Capillary: 100 mg/dL — ABNORMAL HIGH (ref 70–99)
Glucose-Capillary: 113 mg/dL — ABNORMAL HIGH (ref 70–99)
Glucose-Capillary: 118 mg/dL — ABNORMAL HIGH (ref 70–99)

## 2011-04-17 NOTE — Progress Notes (Signed)
Patient ID: Melanie Moss, female   DOB: 1975-09-06, 35 y.o.   MRN: [id number] S: Preterm labor symptoms: pelvic pressure  O: Blood pressure 111/68, pulse 86, temperature 98.1 F (36.7 C), temperature source Oral, resp. rate 18, height 5\' 3"  (1.6 m), weight 69.355 kg (152 lb 14.4 oz), last menstrual period 09/27/2010, SpO2 98.00%, unknown if currently breastfeeding.   WUJ:WJXBJYNW: 150 bpm Toco: occ. UC's. SVE:   A/P- 35 y.o. admitted with preterm labor  Preterm labor management: bedrest advised Dating:  [redacted]w[redacted]d PNL Needed:  none FWB:  good PTL:  stable

## 2011-04-18 ENCOUNTER — Ambulatory Visit (HOSPITAL_COMMUNITY): Payer: Managed Care, Other (non HMO)

## 2011-04-18 ENCOUNTER — Inpatient Hospital Stay (HOSPITAL_COMMUNITY): Payer: Managed Care, Other (non HMO)

## 2011-04-18 LAB — GLUCOSE, CAPILLARY
Glucose-Capillary: 123 mg/dL — ABNORMAL HIGH (ref 70–99)
Glucose-Capillary: 107 mg/dL — ABNORMAL HIGH (ref 70–99)
Glucose-Capillary: 139 mg/dL — ABNORMAL HIGH (ref 70–99)
Glucose-Capillary: 98 mg/dL (ref 70–99)

## 2011-04-18 NOTE — Progress Notes (Signed)
Pt states she is not feeling uc's at this time.

## 2011-04-18 NOTE — Progress Notes (Signed)
Patient ID: Melanie Moss, female   DOB: Apr 11, 1976, 35 y.o.   MRN: [id number] S: Preterm labor symptoms: pelvic pressure  O: Blood pressure 122/70, pulse 98, temperature 98 F (36.7 C), temperature source Oral, resp. rate 20, height 5\' 3"  (1.6 m), weight 69.355 kg (152 lb 14.4 oz), last menstrual period 09/27/2010, SpO2 98.00%, unknown if currently breastfeeding.   NWG:NFAOZHYQ: 135 bpm, Variability: Good {> 6 bpm), Accelerations: Reactive and Decelerations: Absent Toco: rare contractions with UI. SVE:   A/P- 35 y.o. admitted with gestational diabetes and preterm labor  Preterm labor management: bedrest advised, pelvic rest advised and Procardia PO. For growth U/S today.  Dating:  [redacted]w[redacted]d  HAMBY, Reubin Bushnell 04/18/2011 8:39 AM

## 2011-04-19 ENCOUNTER — Ambulatory Visit (HOSPITAL_COMMUNITY): Payer: Managed Care, Other (non HMO)

## 2011-04-19 LAB — GLUCOSE, CAPILLARY
Glucose-Capillary: 92 mg/dL (ref 70–99)
Glucose-Capillary: 89 mg/dL (ref 70–99)
Glucose-Capillary: 109 mg/dL — ABNORMAL HIGH (ref 70–99)
Glucose-Capillary: 119 mg/dL — ABNORMAL HIGH (ref 70–99)

## 2011-04-19 MED ORDER — POLYETHYLENE GLYCOL 3350 17 G PO PACK
17.0000 g | PACK | ORAL | Status: DC
  Administered 2011-04-19 – 2011-05-19 (×15): 17 g via ORAL
  Filled 2011-04-19 (×16): qty 1

## 2011-04-19 MED ORDER — WITCH HAZEL-GLYCERIN EX PADS: MEDICATED_PAD | CUTANEOUS | Status: DC | PRN

## 2011-04-19 MED ORDER — HYDROCORTISONE ACE-PRAMOXINE 1-1 % RE FOAM
1.0000 | Freq: Two times a day (BID) | RECTAL | Status: DC
  Administered 2011-04-19 – 2011-05-19 (×61): 1 via RECTAL
  Filled 2011-04-19 (×3): qty 10

## 2011-04-19 MED ORDER — HYDROCORTISONE 1 % EX CREA
TOPICAL_CREAM | Freq: Four times a day (QID) | CUTANEOUS | Status: DC
  Administered 2011-04-19 – 2011-05-08 (×76): via TOPICAL
  Administered 2011-05-08: 1 via TOPICAL
  Administered 2011-05-08: 10:00:00 via TOPICAL
  Administered 2011-05-08: 1 via TOPICAL
  Administered 2011-05-09: 21:00:00 via TOPICAL
  Administered 2011-05-09 (×2): 1 via TOPICAL
  Administered 2011-05-09 – 2011-05-19 (×37): via TOPICAL
  Filled 2011-04-19 (×2): qty 28

## 2011-04-19 NOTE — Progress Notes (Signed)
UR Chart review completed.  

## 2011-04-19 NOTE — Progress Notes (Signed)
Patient ID: Melanie Moss, female   DOB: 05-Mar-1976, 35 y.o.   MRN: [id number] S: Preterm labor symptoms: denies any pelvic pressure, contractions. Denies HA, visual disturbances, RUQ pain. Pt c/o mild constipation, had several days with no BM, then a large formed hard stool last night. C/o rectal pain, thinks she has a hemorrhoid.   O: Blood pressure 127/83, pulse 93, temperature 98.2 F (36.8 C), temperature source Oral, resp. rate 18, height 5\' 3"  (1.6 m), weight 71.85 kg (158 lb 6.4 oz), last menstrual period 09/27/2010, SpO2 98.00%, unknown if currently breastfeeding. 1 small hemorrhoid noted on exam.    WUJ:WJXBJYNW: 135 bpm, Variability: Good {> 6 bpm), Accelerations: Reactive and Decelerations: Absent Toco: UI with occasional periods of 1-4contractions per hour.  SVE: deferred.  A/P- 35 y.o. admitted with gestational diabetes and cervical insufficiency Preterm labor management: bedrest advised, pelvic rest advised and Continue Rx Procardia 30mg  BID, Follow-up growth ultrasound as advised by MFM, 3-4 weeks. Fetal right renal pyelectasis unchanged from previous ultrasound on 12/4. Appropriate growth at 29% percentile. Continue diabetic diet.  17P injections weekly, due today. Rx tucks pads and proctofoam.   Dating:  [redacted]w[redacted]d  HAMBY, [name] 04/19/2011

## 2011-04-20 LAB — GLUCOSE, CAPILLARY
Glucose-Capillary: 91 mg/dL (ref 70–99)
Glucose-Capillary: 89 mg/dL (ref 70–99)
Glucose-Capillary: 102 mg/dL — ABNORMAL HIGH (ref 70–99)
Glucose-Capillary: 110 mg/dL — ABNORMAL HIGH (ref 70–99)

## 2011-04-20 NOTE — Progress Notes (Signed)
Patient ID: Melanie Moss, female   DOB: [birth year], 35 y.o.   MRN: [id number] S: Preterm labor symptoms: denies pressure, or contractions.  Denies HA, visual disturbances, RUQ pain.   O: Blood pressure 124/78, pulse 82, temperature 97.9 F (36.6 C), temperature source Oral, resp. rate 18, height 5\' 3"  (1.6 m), weight 71.85 kg (158 lb 6.4 oz), last menstrual period 09/27/2010, SpO2 98.00%, unknown if currently breastfeeding.   NWG:NFAOZHYQ: 135 bpm Toco: UI with occasional contractions SVE:   A/P- 35 y.o. admitted with gestational diabetes, preterm labor  and cervical insufficiency Preterm labor management: Continued bedrest, pelvic rest, Rx Procardia 30mg  PO BID, bathroom privileges. Continue 17P weekly.  Dating:  [redacted]w[redacted]d  HAMBY, Elzabeth Mcquerry 04/20/2011 8:50 AM

## 2011-04-21 LAB — GLUCOSE, CAPILLARY
Glucose-Capillary: 86 mg/dL (ref 70–99)
Glucose-Capillary: 101 mg/dL — ABNORMAL HIGH (ref 70–99)
Glucose-Capillary: 109 mg/dL — ABNORMAL HIGH (ref 70–99)

## 2011-04-21 NOTE — Progress Notes (Signed)
Patient ID: Melanie Moss, female   DOB: 11/10/1975, 35 y.o.   MRN: [id number] Bile signs normal Status unchanged No complaints

## 2011-04-22 LAB — GLUCOSE, CAPILLARY
Glucose-Capillary: 107 mg/dL — ABNORMAL HIGH (ref 70–99)
Glucose-Capillary: 98 mg/dL (ref 70–99)
Glucose-Capillary: 88 mg/dL (ref 70–99)
Glucose-Capillary: 94 mg/dL (ref 70–99)
Glucose-Capillary: 93 mg/dL (ref 70–99)

## 2011-04-22 NOTE — Progress Notes (Signed)
Patient ID: Melanie Moss, female   DOB: Apr 15, 1976, 35 y.o.   MRN: [id number] Vital signs normal Status unchanged No complaints

## 2011-04-23 LAB — GLUCOSE, CAPILLARY
Glucose-Capillary: 92 mg/dL (ref 70–99)
Glucose-Capillary: 92 mg/dL (ref 70–99)
Glucose-Capillary: 97 mg/dL (ref 70–99)
Glucose-Capillary: 93 mg/dL (ref 70–99)

## 2011-04-23 NOTE — Progress Notes (Signed)
Patient ID: Melanie Moss, female   DOB: 05/29/1975, 35 y.o.   MRN: [id number] S: Preterm labor symptoms: pelvic pressure  O: Blood pressure 126/85, pulse 97, temperature 97.5 F (36.4 C), temperature source Oral, resp. rate 20, height 5\' 3"  (1.6 m), weight 71.85 kg (158 lb 6.4 oz), last menstrual period 09/27/2010, SpO2 98.00%, unknown if currently breastfeeding.   FAO:ZHYQMVHQ: 150 bpm Toco: occ. SVE:   A/P- 35 y.o. admitted with preterm labor  Preterm labor management: bedrest advised Dating:  [redacted]w[redacted]d PNL Needed:  none FWB:  good PTL:  stable

## 2011-04-24 LAB — GLUCOSE, CAPILLARY
Glucose-Capillary: 110 mg/dL — ABNORMAL HIGH (ref 70–99)
Glucose-Capillary: 92 mg/dL (ref 70–99)
Glucose-Capillary: 105 mg/dL — ABNORMAL HIGH (ref 70–99)
Glucose-Capillary: 85 mg/dL (ref 70–99)

## 2011-04-24 NOTE — L&D Delivery Note (Signed)
Delivery Note At 5:21 AM a viable female was delivered via Vaginal, Spontaneous Delivery (Presentation: Left Occiput Anterior).  APGAR: 9, 9; weight .   Placenta status: Intact, Spontaneous.  Cord: 3 vessels with the following complications: .  Cord pH: none  Anesthesia: Epidural  Episiotomy: None Lacerations: 2nd degree Suture Repair: 2.0 3.0 chromic vicryl rapide Est. Blood Loss (mL): 300  Mom to postpartum.  Baby to NICU for prematurity.Marland Kitchen  Melanie Moss A 06/11/2011, 6:12 AM

## 2011-04-24 NOTE — Progress Notes (Signed)
Patient ID: Melanie Moss, female   DOB: Apr 07, 1976, 36 y.o.   MRN: [id number] S: Preterm labor symptoms: pelvic pressure  O: Blood pressure 118/74, pulse 102, temperature 97.5 F (36.4 C), temperature source Oral, resp. rate 18, height 5\' 3"  (1.6 m), weight 71.85 kg (158 lb 6.4 oz), last menstrual period 09/27/2010, SpO2 98.00%, unknown if currently breastfeeding.   WGN:FAOZHYQM: 150 bpm Toco: occ. SVE:   A/P- 36 y.o. admitted with preterm labor  Preterm labor management: bedrest advised Dating:  [redacted]w[redacted]d PNL Needed:  none FWB:  good PTL:  stable

## 2011-04-25 LAB — GLUCOSE, CAPILLARY
Glucose-Capillary: 91 mg/dL (ref 70–99)
Glucose-Capillary: 89 mg/dL (ref 70–99)
Glucose-Capillary: 103 mg/dL — ABNORMAL HIGH (ref 70–99)
Glucose-Capillary: 96 mg/dL (ref 70–99)

## 2011-04-25 NOTE — Progress Notes (Signed)
Patient ID: Melanie Moss, female   DOB: [birth year], 36 y.o.   MRN: [id number] S: Preterm labor symptoms: none  O: Blood pressure 107/66, pulse 87, temperature 98.1 F (36.7 C), temperature source Oral, resp. rate 18, height 5\' 3"  (1.6 m), weight 71.85 kg (158 lb 6.4 oz), last menstrual period 09/27/2010, SpO2 98.00%, unknown if currently breastfeeding.  CBG (last 3)   Basename 04/25/11 0557 04/24/11 2134 04/24/11 1701  GLUCAP 91 85 105*      OZH:YQMVHQIO: 140 bpm Toco: None SVE: Deferred  A/P- 36 y.o. admitted with gestational diabetes and preterm labor  Preterm labor management: bedrest advised, pelvic rest advised and Procardia maintenance Dating:  [redacted]w[redacted]d  PTL:  See above

## 2011-04-26 LAB — GLUCOSE, CAPILLARY
Glucose-Capillary: 76 mg/dL (ref 70–99)
Glucose-Capillary: 88 mg/dL (ref 70–99)
Glucose-Capillary: 106 mg/dL — ABNORMAL HIGH (ref 70–99)
Glucose-Capillary: 102 mg/dL — ABNORMAL HIGH (ref 70–99)

## 2011-04-26 NOTE — Progress Notes (Signed)
Patient ID: [name], female   DOB: 02/26/1976, 36 y.o.   MRN: [id number] S: Preterm labor symptoms: none  O: Blood pressure 127/80, pulse 90, temperature 98.3 F (36.8 C), temperature source Oral, resp. rate 18, height 5\' 3"  (1.6 m), weight 72.031 kg (158 lb 12.8 oz), last menstrual period 09/27/2010, SpO2 98.00%, unknown if currently breastfeeding.   WUJ:WJXBJYNW: 150 bpm Toco: None SVE:   Deferred  CBG (last 3)   Basename 04/26/11 0557 04/25/11 2246 04/25/11 1749  GLUCAP 76 96 103*     A/P- 36 y.o. admitted with gestational diabetes and preterm labor  Preterm labor management: bedrest advised, pelvic rest advised and Procardia maintenance Dating:  [redacted]w[redacted]d  PTL:  See above

## 2011-04-27 LAB — GLUCOSE, CAPILLARY
Glucose-Capillary: 87 mg/dL (ref 70–99)
Glucose-Capillary: 100 mg/dL — ABNORMAL HIGH (ref 70–99)
Glucose-Capillary: 111 mg/dL — ABNORMAL HIGH (ref 70–99)
Glucose-Capillary: 110 mg/dL — ABNORMAL HIGH (ref 70–99)

## 2011-04-27 NOTE — Progress Notes (Signed)
Patient ID: Melanie Moss, female   DOB: 1975/05/29, 36 y.o.   MRN: [id number] S: Preterm labor symptoms: UC's.  O: Blood pressure 109/64, pulse 94, temperature 98.2 F (36.8 C), temperature source Oral, resp. rate 18, height 5\' 3"  (1.6 m), weight 72.031 kg (158 lb 12.8 oz), last menstrual period 09/27/2010, SpO2 98.00%, unknown if currently breastfeeding.   WUJ:WJXBJYNW: 150 bpm Toco: occasional. SVE:   A/P- 36 y.o. admitted with preterm labor  Preterm labor management: bedrest advised Dating:  [redacted]w[redacted]d PNL Needed:  none FWB:  good PTL:  stable ROD: n/a

## 2011-04-28 LAB — GLUCOSE, CAPILLARY
Glucose-Capillary: 91 mg/dL (ref 70–99)
Glucose-Capillary: 90 mg/dL (ref 70–99)
Glucose-Capillary: 144 mg/dL — ABNORMAL HIGH (ref 70–99)
Glucose-Capillary: 124 mg/dL — ABNORMAL HIGH (ref 70–99)

## 2011-04-28 NOTE — Progress Notes (Signed)
Pt. On telephone talking to family member.

## 2011-04-28 NOTE — Progress Notes (Signed)
Patient ID: Melanie Moss, female   DOB: 23-Dec-1975, 36 y.o.   MRN: [id number] S: Preterm labor symptoms: pelvic pressure  O: Blood pressure 125/73, pulse 88, temperature 97.4 F (36.3 C), temperature source Oral, resp. rate 20, height 5\' 3"  (1.6 m), weight 72.031 kg (158 lb 12.8 oz), last menstrual period 09/27/2010, SpO2 98.00%, unknown if currently breastfeeding.   WUJ:WJXBJYNW: 150 bpm Toco: occ. SVE:   A/P- 36 y.o. admitted with preterm labor  Preterm labor management: bedrest advised Dating:  [redacted]w[redacted]d PNL Needed:  none FWB:  good PTL:  stable ROD: n/a

## 2011-04-29 LAB — GLUCOSE, CAPILLARY
Glucose-Capillary: 101 mg/dL — ABNORMAL HIGH (ref 70–99)
Glucose-Capillary: 97 mg/dL (ref 70–99)
Glucose-Capillary: 126 mg/dL — ABNORMAL HIGH (ref 70–99)
Glucose-Capillary: 102 mg/dL — ABNORMAL HIGH (ref 70–99)

## 2011-04-29 NOTE — Progress Notes (Signed)
Patient ID: Melanie Moss, female   DOB: 22-Jun-1975, 36 y.o.   MRN: [id number] S: Preterm labor symptoms: UC's.  O: Blood pressure 118/68, pulse 90, temperature 98.2 F (36.8 C), temperature source Oral, resp. rate 20, height 5\' 3"  (1.6 m), weight 72.031 kg (158 lb 12.8 oz), last menstrual period 09/27/2010, SpO2 98.00%, unknown if currently breastfeeding.   UUV:OZDGUYQI: 150 bpm Toco: occasional. SVE:   A/P- 36 y.o. admitted with preterm labor  Preterm labor management: bedrest advised Dating:  [redacted]w[redacted]d PNL Needed:  none FWB:  good PTL:  stable ROD: n/a.

## 2011-04-30 LAB — GLUCOSE, CAPILLARY
Glucose-Capillary: 86 mg/dL (ref 70–99)
Glucose-Capillary: 94 mg/dL (ref 70–99)
Glucose-Capillary: 122 mg/dL — ABNORMAL HIGH (ref 70–99)
Glucose-Capillary: 136 mg/dL — ABNORMAL HIGH (ref 70–99)

## 2011-04-30 NOTE — Progress Notes (Signed)
Patient ID: Melanie Moss, female   DOB: 03-12-1976, 36 y.o.   MRN: [id number] S: Preterm labor symptoms: resting  O: Blood pressure 121/71, pulse 94, temperature 97.5 F (36.4 C), temperature source Oral, resp. rate 18, height 5\' 3"  (1.6 m), weight 72.031 kg (158 lb 12.8 oz), last menstrual period 09/27/2010, SpO2 98.00%, unknown if currently breastfeeding.   QQV:ZDGLOVFI: 140 bpm Toco: None SVE: deferred  CBG (last 3)   Basename 04/30/11 0613 04/29/11 2123 04/29/11 1603  GLUCAP 86 102* 126*     A/P- 36 y.o. admitted with gestational diabetes and preterm labor  Preterm labor management: bedrest advised and pelvic rest advised Dating:  [redacted]w[redacted]d PTL:  Remains w/adequate tocolysis on maintenance Procardia

## 2011-04-30 NOTE — Progress Notes (Signed)
Ur chart review completed. And also ur review completed on 04/24/10 ( late entry).

## 2011-05-01 LAB — GLUCOSE, CAPILLARY
Glucose-Capillary: 93 mg/dL (ref 70–99)
Glucose-Capillary: 107 mg/dL — ABNORMAL HIGH (ref 70–99)
Glucose-Capillary: 106 mg/dL — ABNORMAL HIGH (ref 70–99)
Glucose-Capillary: 129 mg/dL — ABNORMAL HIGH (ref 70–99)

## 2011-05-01 NOTE — Progress Notes (Signed)
FHR reviewed by Valinda Hoar RN and agrees reassurring FHR

## 2011-05-01 NOTE — Progress Notes (Signed)
Patient ID: Melanie Moss, female   DOB: 1975-12-10, 37 y.o.   MRN: [id number] S: Preterm labor symptoms: pelvic pressure  O: Blood pressure 120/70, pulse 82, temperature 98.1 F (36.7 C), temperature source Oral, resp. rate 20, height 5\' 3"  (1.6 m), weight 72.031 kg (158 lb 12.8 oz), last menstrual period 09/27/2010, SpO2 98.00%, unknown if currently breastfeeding.   OZH:YQMVHQIO: 150 bpm Toco: occ. UC's SVE:   A/P- 36 y.o. admitted with preterm labor  Preterm labor management: bedrest advised Dating:  [redacted]w[redacted]d PNL Needed:  none FWB:  good PTL:  stable

## 2011-05-01 NOTE — Progress Notes (Signed)
U/S called to bedside to assist with FHR assessment, pt changing positions

## 2011-05-02 LAB — GLUCOSE, CAPILLARY
Glucose-Capillary: 85 mg/dL (ref 70–99)
Glucose-Capillary: 81 mg/dL (ref 70–99)
Glucose-Capillary: 87 mg/dL (ref 70–99)
Glucose-Capillary: 101 mg/dL — ABNORMAL HIGH (ref 70–99)

## 2011-05-02 NOTE — Progress Notes (Signed)
Patient ID: Melanie Moss, female   DOB: 1975/08/11, 36 y.o.   MRN: [id number] S: Preterm labor symptoms: pelvic pressure  O: Blood pressure 126/78, pulse 84, temperature 98.5 F (36.9 C), temperature source Oral, resp. rate 20, height 5\' 3"  (1.6 m), weight 72.031 kg (158 lb 12.8 oz), last menstrual period 09/27/2010, SpO2 99.00%, unknown if currently breastfeeding.   JYN:WGNFAOZH: 150 bpm Toco: occ. uc's SVE:   A/P- 36 y.o. admitted with preterm labor  Preterm labor management: bedrest advised Dating:  [redacted]w[redacted]d PNL Needed:  None  FWB:  good PTL:  stabl

## 2011-05-03 ENCOUNTER — Inpatient Hospital Stay (HOSPITAL_COMMUNITY): Payer: Managed Care, Other (non HMO)

## 2011-05-03 LAB — GLUCOSE, CAPILLARY
Glucose-Capillary: 85 mg/dL (ref 70–99)
Glucose-Capillary: 83 mg/dL (ref 70–99)
Glucose-Capillary: 107 mg/dL — ABNORMAL HIGH (ref 70–99)
Glucose-Capillary: 100 mg/dL — ABNORMAL HIGH (ref 70–99)

## 2011-05-03 NOTE — Progress Notes (Signed)
Patient ID: Melanie Moss, female   DOB: 1975/07/30, 36 y.o.   MRN: [id number] S: Preterm labor symptoms: pelvic pressure  O: Blood pressure 126/85, pulse 83, temperature 98.2 F (36.8 C), temperature source Oral, resp. rate 18, height 5\' 3"  (1.6 m), weight 72.077 kg (158 lb 14.4 oz), last menstrual period 09/27/2010, SpO2 99.00%, unknown if currently breastfeeding.   NWG:NFAOZHYQ: 150 bpm Toco: 0ccasional UC. SVE:   A/P- 36 y.o. admitted with preterm labor  Preterm labor management: bedrest advised Dating:  [redacted]w[redacted]d PNL Needed:  none FWB:  good PTL:  stable

## 2011-05-04 LAB — GLUCOSE, CAPILLARY
Glucose-Capillary: 87 mg/dL (ref 70–99)
Glucose-Capillary: 98 mg/dL (ref 70–99)
Glucose-Capillary: 77 mg/dL (ref 70–99)
Glucose-Capillary: 108 mg/dL — ABNORMAL HIGH (ref 70–99)

## 2011-05-04 NOTE — Progress Notes (Signed)
Patient ID: Melanie Moss, female   DOB: Feb 28, 1976, 36 y.o.   MRN: [id number] S: Preterm labor symptoms: pelvic pressure  O: Blood pressure 120/84, pulse 104, temperature 97.9 F (36.6 C), temperature source Oral, resp. rate 18, height 5\' 3"  (1.6 m), weight 72.077 kg (158 lb 14.4 oz), last menstrual period 09/27/2010, SpO2 99.00%, unknown if currently breastfeeding.   WUJ:WJXBJYNW: 150 bpm Toco: None SVE:   A/P- 36 y.o. admitted with preterm labor  Preterm labor management: bedrest advised and pelvic rest advised Dating:  [redacted]w[redacted]d PNL Needed:  none FWB:  good PTL:  stable

## 2011-05-04 NOTE — Progress Notes (Signed)
UR Chart review completed.  

## 2011-05-05 LAB — GLUCOSE, CAPILLARY
Glucose-Capillary: 84 mg/dL (ref 70–99)
Glucose-Capillary: 90 mg/dL (ref 70–99)
Glucose-Capillary: 91 mg/dL (ref 70–99)

## 2011-05-05 NOTE — Progress Notes (Signed)
Patient states Dr. Gaynell Face was in to visit her earlier this morning.  According to chart - no new orders received.

## 2011-05-06 LAB — GLUCOSE, CAPILLARY: Glucose-Capillary: 97 mg/dL (ref 70–99)

## 2011-05-06 NOTE — Progress Notes (Signed)
Pt. Shared with RN - "I may not have a job - 12 weeks of FMLA is up."  Increased diastolic may be due to loss of job.

## 2011-05-06 NOTE — Progress Notes (Signed)
Patient ID: Melanie Moss, female   DOB: 1975/08/15, 36 y.o.   MRN: [id number] Vital signs normal no change in condition continue present therapy and

## 2011-05-06 NOTE — Progress Notes (Signed)
Order received - wound culture via Dr. Gaynell Face.

## 2011-05-07 LAB — GLUCOSE, CAPILLARY
Glucose-Capillary: 126 mg/dL — ABNORMAL HIGH (ref 70–99)
Glucose-Capillary: 95 mg/dL (ref 70–99)
Glucose-Capillary: 102 mg/dL — ABNORMAL HIGH (ref 70–99)
Glucose-Capillary: 99 mg/dL (ref 70–99)
Glucose-Capillary: 75 mg/dL (ref 70–99)
Glucose-Capillary: 91 mg/dL (ref 70–99)
Glucose-Capillary: 91 mg/dL (ref 70–99)
Glucose-Capillary: 96 mg/dL (ref 70–99)

## 2011-05-07 NOTE — Progress Notes (Signed)
Patient ID: Melanie Moss, female   DOB: 08/30/1975, 36 y.o.   MRN: [id number] S: Preterm labor symptoms: none  O: Blood pressure 103/69, pulse 93, temperature 98.5 F (36.9 C), temperature source Oral, resp. rate 20, height 5\' 3"  (1.6 m), weight 72.077 kg (158 lb 14.4 oz), last menstrual period 09/27/2010, SpO2 96.00%, unknown if currently breastfeeding.   WUJ:WJXBJYNW: 140 bpm, Variability: Good {> 6 bpm) and Accelerations: Non-reactive but appropriate for gestational age Toco: None SVE: Deferred  CBG (last 3)   Basename 05/07/11 0557 05/06/11 2059 05/06/11 1615  GLUCAP 75 99 102*     A/P- 36 y.o. admitted with gestational diabetes and preterm labor  Preterm labor management: bedrest advised, pelvic rest advised and Procardia maintenance Dating:  [redacted]w[redacted]d

## 2011-05-08 LAB — GLUCOSE, CAPILLARY
Glucose-Capillary: 74 mg/dL (ref 70–99)
Glucose-Capillary: 93 mg/dL (ref 70–99)
Glucose-Capillary: 111 mg/dL — ABNORMAL HIGH (ref 70–99)
Glucose-Capillary: 89 mg/dL (ref 70–99)

## 2011-05-08 NOTE — Progress Notes (Signed)
Patient ID: Melanie Moss, female   DOB: 01-12-1976, 36 y.o.   MRN: [id number] S: Preterm labor symptoms: pelvic pressure  O: Blood pressure 134/85, pulse 109, temperature 97.6 F (36.4 C), temperature source Oral, resp. rate 16, height 5\' 3"  (1.6 m), weight 72.077 kg (158 lb 14.4 oz), last menstrual period 09/27/2010, SpO2 98.00%, unknown if currently breastfeeding.   NWG:NFAOZHYQ: 150 bpm Toco: occ. UC. SVE:   A/P- 36 y.o. admitted with preterm labor  Preterm labor management: bedrest advised and pelvic rest advised Dating:  [redacted]w[redacted]d PNL Needed:  None. FWB:  good PTL:  stable

## 2011-05-08 NOTE — Progress Notes (Signed)
UR Chart review completed.  

## 2011-05-09 LAB — GLUCOSE, CAPILLARY
Glucose-Capillary: 87 mg/dL (ref 70–99)
Glucose-Capillary: 89 mg/dL (ref 70–99)
Glucose-Capillary: 85 mg/dL (ref 70–99)
Glucose-Capillary: 120 mg/dL — ABNORMAL HIGH (ref 70–99)

## 2011-05-09 NOTE — Progress Notes (Signed)
UR chart review completed.  

## 2011-05-09 NOTE — Progress Notes (Signed)
Patient ID: Melanie Moss, female   DOB: May 13, 1975, 36 y.o.   MRN: [id number] S: Preterm labor symptoms: UCs last pm  O: Blood pressure 129/82, pulse 92, temperature 98.1 F (36.7 C), temperature source Oral, resp. rate 18, height 5\' 3"  (1.6 m), weight 72.077 kg (158 lb 14.4 oz), last menstrual period 09/27/2010, SpO2 98.00%, unknown if currently breastfeeding.   ION:GEXBMWUX: 150 bpm Toco: None SVE: Deferred  A/P- 36 y.o. admitted with gestational diabetes and preterm labor  Preterm labor management: bedrest advised and continue Procardia maintenance Dating:  [redacted]w[redacted]d Check CBGs 3 days/week U/S for growth this week Bathroom privileges

## 2011-05-10 NOTE — Progress Notes (Signed)
Patient ID: Melanie Moss, female   DOB: 1976-02-23, 36 y.o.   MRN: [id number] S: Preterm labor symptoms: pelvic pressure  O: Blood pressure 116/71, pulse 100, temperature 98.1 F (36.7 C), temperature source Oral, resp. rate 18, height 5\' 3"  (1.6 m), weight 71.804 kg (158 lb 4.8 oz), last menstrual period 09/27/2010, SpO2 97.00%, unknown if currently breastfeeding.   XLK:GMWNUUVO: 150 bpm, Variability: Good {> 6 bpm), Accelerations: Reactive and Decelerations: Absent Toco: occasional SVE:   A/P- 36 y.o. admitted with preterm labor  Preterm labor management: bedrest advised Dating:  [redacted]w[redacted]d PNL Needed:  none FWB:  good PTL:  stable

## 2011-05-11 ENCOUNTER — Inpatient Hospital Stay (HOSPITAL_COMMUNITY): Payer: Managed Care, Other (non HMO)

## 2011-05-11 LAB — GLUCOSE, CAPILLARY
Glucose-Capillary: 80 mg/dL (ref 70–99)
Glucose-Capillary: 96 mg/dL (ref 70–99)
Glucose-Capillary: 96 mg/dL (ref 70–99)
Glucose-Capillary: 100 mg/dL — ABNORMAL HIGH (ref 70–99)

## 2011-05-11 NOTE — Progress Notes (Signed)
UR Chart review completed.  

## 2011-05-11 NOTE — Progress Notes (Signed)
Patient ID: Melanie Moss, female   DOB: 1975/08/17, 35 y.o.   MRN: [id number] S: Preterm labor symptoms: pelvic pressure  O: Blood pressure 119/69, pulse 91, temperature 98.2 F (36.8 C), temperature source Oral, resp. rate 18, height 5\' 3"  (1.6 m), weight 71.804 kg (158 lb 4.8 oz), last menstrual period 09/27/2010, SpO2 97.00%, unknown if currently breastfeeding.   WUJ:WJXBJYNW: 150 bpm Toco: occasional UC's. SVE:   A/P- 36 y.o. admitted with preterm labor  Preterm labor management: bedrest advised Dating:  [redacted]w[redacted]d PNL Needed:  none FWB:  good PTL:  stable F/U ultrasound today.

## 2011-05-12 NOTE — Progress Notes (Signed)
Patient ID: Melanie Moss, female   DOB: 20-Jan-1976, 35 y.o.   MRN: [id number] S: Preterm labor symptoms: none  O: Blood pressure 125/80, pulse 91, temperature 97.8 F (36.6 C), temperature source Oral, resp. rate 18, height 5\' 3"  (1.6 m), weight 71.804 kg (158 lb 4.8 oz), last menstrual period 09/27/2010, SpO2 97.00%, unknown if currently breastfeeding.   OZH:YQMVHQIO: 140 bpm  CBG (last 3)   Basename 05/11/11 2148 05/11/11 1541 05/11/11 1101  GLUCAP 100* 96 96    Toco: None SVE: deferred  A/P- 36 y.o. admitted with gestational diabetes and preterm labor  Preterm labor management: bedrest advised and pelvic rest advised Dating:  [redacted]w[redacted]d

## 2011-05-13 NOTE — Progress Notes (Signed)
Patient ID: Melanie Moss, female   DOB: Feb 18, 1976, 36 y.o.   MRN: [id number] S: Preterm labor symptoms: none  O: Blood pressure 114/69, pulse 90, temperature 98.2 F (36.8 C), temperature source Oral, resp. rate 18, height 5\' 3"  (1.6 m), weight 71.804 kg (158 lb 4.8 oz), last menstrual period 09/27/2010, SpO2 97.00%, unknown if currently breastfeeding.   NWG:NFAOZHYQ: 140 bpm CBG (last 3)   Basename 05/11/11 2148 05/11/11 1541 05/11/11 1101  GLUCAP 100* 96 96    Toco: None SVE: deferred  A/P- 36 y.o. admitted with gestational diabetes and preterm labor  Preterm labor management: bedrest advised and pelvic rest advised Dating:  [redacted]w[redacted]d

## 2011-05-14 LAB — GLUCOSE, CAPILLARY
Glucose-Capillary: 90 mg/dL (ref 70–99)
Glucose-Capillary: 90 mg/dL (ref 70–99)
Glucose-Capillary: 110 mg/dL — ABNORMAL HIGH (ref 70–99)
Glucose-Capillary: 101 mg/dL — ABNORMAL HIGH (ref 70–99)

## 2011-05-14 NOTE — Progress Notes (Signed)
UR chart review completed.  

## 2011-05-14 NOTE — Progress Notes (Signed)
Pt was feeling positive and hopeful and looking forward to going home on Saturday.  Offered emotional support and prayer.  Thornell Sartorius Chaplain Pager, [phone] 10:43 AM

## 2011-05-14 NOTE — Progress Notes (Signed)
Patient ID: Melanie Moss, female   DOB: 1976/01/11, 36 y.o.   MRN: [id number] S: Preterm labor symptoms: pelvic pressure  O: Blood pressure 123/81, pulse 87, temperature 98.3 F (36.8 C), temperature source Oral, resp. rate 18, height 5\' 3"  (1.6 m), weight 71.804 kg (158 lb 4.8 oz), last menstrual period 09/27/2010, SpO2 97.00%, unknown if currently breastfeeding.   WUJ:WJXBJYNW: 150 bpm, Variability: Good {> 6 bpm), Accelerations: Reactive and Decelerations: Absent Toco: occ. Mild UC's. SVE:   A/P- 36 y.o. admitted with preterm labor  Preterm labor management: bedrest advised Dating:  [redacted]w[redacted]d PNL Needed:  none FWB:  good PTL:  stable

## 2011-05-15 NOTE — Progress Notes (Signed)
Patient ID: Melanie Moss, female   DOB: 1975-05-30, 36 y.o.   MRN: [id number] S: Preterm labor symptoms: pelvic pressure  O: Blood pressure 118/80, pulse 92, temperature 98.2 F (36.8 C), temperature source Oral, resp. rate 18, height 5\' 3"  (1.6 m), weight 71.804 kg (158 lb 4.8 oz), last menstrual period 09/27/2010, SpO2 97.00%, unknown if currently breastfeeding.   WUJ:WJXBJYNW: 150 bpm Toco: occ. UC's. SVE:   A/P- 36 y.o. admitted with preterm labor  Preterm labor management: bedrest advised Dating:  [redacted]w[redacted]d PNL Needed:  None  FWB:  good PTL:  stable

## 2011-05-16 LAB — GLUCOSE, CAPILLARY
Glucose-Capillary: 94 mg/dL (ref 70–99)
Glucose-Capillary: 93 mg/dL (ref 70–99)
Glucose-Capillary: 142 mg/dL — ABNORMAL HIGH (ref 70–99)
Glucose-Capillary: 94 mg/dL (ref 70–99)

## 2011-05-16 NOTE — Progress Notes (Signed)
05/16/11 1300  Clinical Encounter Type  Visited With Patient  Visit Type Follow-up  Referral From Chaplain  Spiritual Encounters  Spiritual Needs Emotional    Followed up for encouragement.  [name] uses faith and gratitude to cope, taking each new step in pregnancy as something to savor (through complications and after four losses). We talked about how her baby shower here on Saturday before she leaves will serve as a celebratory rite of passage.    Pt is aware of ongoing chaplain availability for this stay and for future needs.  Avis Epley, [place] Chaplain [phone]

## 2011-05-16 NOTE — Progress Notes (Signed)
Patient ID: Melanie Moss, female   DOB: 1975-08-02, 36 y.o.   MRN: [id number] S: Preterm labor symptoms: pelvic pressure  O: Blood pressure 111/69, pulse 94, temperature 98 F (36.7 C), temperature source Oral, resp. rate 18, height 5\' 3"  (1.6 m), weight 71.804 kg (158 lb 4.8 oz), last menstrual period 09/27/2010, SpO2 97.00%, unknown if currently breastfeeding.   NWG:NFAOZHYQ: 150 bpm Toco: occ. UC's. SVE:   A/P- 36 y.o. admitted with preterm labor  Preterm labor management: bedrest advised Dating:  [redacted]w[redacted]d PNL Needed:  none FWB:  good PTL:  stable

## 2011-05-17 NOTE — Progress Notes (Signed)
Patient ID: MEILIN BROSH, female   DOB: [birth year], 36 y.o.   MRN: [id number] S: Preterm labor symptoms: pelvic pressure  O: Blood pressure 120/76, pulse 104, temperature 97.3 F (36.3 C), temperature source Oral, resp. rate 18, height 5\' 3"  (1.6 m), weight 71.442 kg (157 lb 8 oz), last menstrual period 09/27/2010, SpO2 98.00%, unknown if currently breastfeeding.   QIO:NGEXBMWU: 150 bpm Toco: occ. Mild UC's. SVE:   A/P- 36 y.o. admitted with preterm labor  Preterm labor management: bedrest advised Dating:  [redacted]w[redacted]d PNL Needed:  none FWB:  good PTL:  stable

## 2011-05-17 NOTE — Progress Notes (Signed)
UR Chart review completed.  

## 2011-05-18 LAB — GLUCOSE, CAPILLARY
Glucose-Capillary: 85 mg/dL (ref 70–99)
Glucose-Capillary: 91 mg/dL (ref 70–99)
Glucose-Capillary: 114 mg/dL — ABNORMAL HIGH (ref 70–99)
Glucose-Capillary: 130 mg/dL — ABNORMAL HIGH (ref 70–99)

## 2011-05-18 NOTE — Progress Notes (Signed)
Followed up with pt who is excited about going home and being in her own environment.  We shared a prayer for her time of transition and for her health.   Thornell Sartorius Chaplain Pager, [phone] 10:55 AM   05/18/11 1000  Clinical Encounter Type  Visited With Patient  Visit Type Follow-up  Spiritual Encounters  Spiritual Needs Prayer

## 2011-05-19 MED ORDER — NIFEDIPINE ER OSMOTIC RELEASE 30 MG PO TB24: 30.0000 mg | ORAL_TABLET | Freq: Two times a day (BID) | ORAL | Status: DC

## 2011-05-19 NOTE — Discharge Summary (Signed)
Physician Discharge Summary  Patient ID: KYAN GIANNONE MRN: [id number] DOB/AGE: [birth year] 35 y.o.  Admit date: 03/09/2011 Discharge date: 05/19/2011  Admission Diagnoses:  Discharge Diagnoses:  Active Problems:  Incompetent cervix  Gestational diabetes   Discharged Condition: good  Hospital Course: Incompetent cervix.  Cerclage.  Preterm cervical changes.  Responded well to bedrest.  Discharged home at [redacted] weeks gestation.  Consults: MFM  Significant Diagnostic Studies: ultrasound  Treatments: Procardia  Discharge Exam: Blood pressure 122/78, pulse 94, temperature 97.8 F (36.6 C), temperature source Oral, resp. rate 18, height 5\' 3"  (1.6 m), weight 71.442 kg (157 lb 8 oz), last menstrual period 09/27/2010, SpO2 98.00%, unknown if currently breastfeeding. General appearance: alert and no distress  Disposition: Home or Self Care  Discharge Orders    Future Orders Please Complete By Expires   Discharge instructions      Comments:   Routine   PRETERM LABOR:  Includes any of the follwing symptoms that occur between 20 - [redacted] weeks gestation.  If these symptoms are not stopped, preterm labor can result in preterm delivery, placing your baby at risk      Notify physician for menstrual like cramps      Notify physician for uterine contractions.  These may be painless and feel like the uterus is tightening or the baby is  "balling up"      Notify physician for low, dull backache, unrelieved by heat or Tylenol      Notify physician for intestinal cramps, with or without diarrhea, sometimes described as "gas pain"      Notify physician for pelvic pressure      Notify physician for increase or change in vaginal discharge      Notify physician for vaginal bleeding      Notify physician for a general feeling that "something is not right"      Notify physician for leaking of fluid      Fetal Kick Count:  Lie on our left side for one hour after a meal, and count the number of times  your baby kicks.  If it is less than 5 times, get up, move around and drink some juice.  Repeat the test 30 minutes later.  If it is still less than 5 kicks in an hour, notify your doctor.      Discharge activity: Bedrest      Discharge diet:      Do not have sex or do anything that might make you have an orgasm      Discharge patient      Rubella antibody, IgM      Comments:   This external order was created through the Results Console.   RPR      Comments:   This external order was created through the Results Console.   ABO/Rh      Comments:   This external order was created through the Results Console.     Medication List  As of 05/19/2011  3:07 AM   STOP taking these medications         B-6 PO      ibuprofen 200 MG tablet      nebivolol 10 MG tablet         TAKE these medications         acetaminophen 500 MG tablet   Commonly known as: TYLENOL   Take 500-1,000 mg by mouth every 6 (six) hours as needed. pain      NIFEdipine 30 MG  24 hr tablet   Commonly known as: PROCARDIA XL/ADALAT-CC   Take 1 tablet (30 mg total) by mouth 2 (two) times daily.           Follow-up Information    Follow up with Antionette Char A, MD in 1 week.   Contact information:   [address], [address] [place] [address] [phone]          Signed: Brock Bad 05/19/2011, 3:07 AM

## 2011-05-19 NOTE — Progress Notes (Signed)
Discharge teaching done.  Labor precautions, fetal kick counts, signs of infection.  Pt voices understanding.

## 2011-05-19 NOTE — Progress Notes (Signed)
Patient ID: Melanie Moss, female   DOB: Sep 05, 1975, 36 y.o.   MRN: [id number] S: Preterm labor symptoms: pelvic pressure  O: Blood pressure 122/78, pulse 94, temperature 97.8 F (36.6 C), temperature source Oral, resp. rate 18, height 5\' 3"  (1.6 m), weight 71.442 kg (157 lb 8 oz), last menstrual period 09/27/2010, SpO2 98.00%, unknown if currently breastfeeding.   ZHY:QMVHQION: 150 bpm Toco: None SVE:   A/P- 36 y.o. admitted with preterm labor  Preterm labor management: bedrest advised Dating:  [redacted]w[redacted]d PNL Needed:  none FWB:  good PTL:  stable

## 2011-06-11 ENCOUNTER — Encounter (HOSPITAL_COMMUNITY): Payer: Self-pay | Admitting: Anesthesiology

## 2011-06-11 ENCOUNTER — Inpatient Hospital Stay (HOSPITAL_COMMUNITY)
Admission: AD | Admit: 2011-06-11 | Discharge: 2011-06-13 | DRG: 775 | Disposition: A | Discharge status: Home / Self Care | Payer: Managed Care, Other (non HMO) | Source: Ambulatory Visit | Attending: Obstetrics | Admitting: Obstetrics

## 2011-06-11 ENCOUNTER — Other Ambulatory Visit: Payer: Self-pay | Admitting: Obstetrics

## 2011-06-11 ENCOUNTER — Encounter (HOSPITAL_COMMUNITY): Payer: Self-pay | Admitting: *Deleted

## 2011-06-11 ENCOUNTER — Inpatient Hospital Stay (HOSPITAL_COMMUNITY): Payer: Managed Care, Other (non HMO) | Admitting: Anesthesiology

## 2011-06-11 DIAGNOSIS — O09529 Supervision of elderly multigravida, unspecified trimester: Secondary | ICD-10-CM | POA: Diagnosis present

## 2011-06-11 DIAGNOSIS — N883 Incompetence of cervix uteri: Secondary | ICD-10-CM

## 2011-06-11 DIAGNOSIS — O24419 Gestational diabetes mellitus in pregnancy, unspecified control: Secondary | ICD-10-CM

## 2011-06-11 DIAGNOSIS — D649 Anemia, unspecified: Secondary | ICD-10-CM

## 2011-06-11 DIAGNOSIS — O2620 Pregnancy care for patient with recurrent pregnancy loss, unspecified trimester: Secondary | ICD-10-CM

## 2011-06-11 DIAGNOSIS — K219 Gastro-esophageal reflux disease without esophagitis: Secondary | ICD-10-CM

## 2011-06-11 DIAGNOSIS — O3430 Maternal care for cervical incompetence, unspecified trimester: Principal | ICD-10-CM | POA: Diagnosis present

## 2011-06-11 LAB — CBC
HCT: 40 % (ref 36.0–46.0)
HCT: 37.6 % (ref 36.0–46.0)
Hemoglobin: 13.5 g/dL (ref 12.0–15.0)
Hemoglobin: 12.4 g/dL (ref 12.0–15.0)
MCH: 28.8 pg (ref 26.0–34.0)
MCH: 28.5 pg (ref 26.0–34.0)
MCHC: 33.8 g/dL (ref 30.0–36.0)
MCHC: 33 g/dL (ref 30.0–36.0)
MCV: 85.5 fL (ref 78.0–100.0)
MCV: 86.4 fL (ref 78.0–100.0)
Platelets: 310 10*3/uL (ref 150–400)
Platelets: 270 10*3/uL (ref 150–400)
RBC: 4.68 MIL/uL (ref 3.87–5.11)
RBC: 4.35 MIL/uL (ref 3.87–5.11)
RDW: 17.6 % — ABNORMAL HIGH (ref 11.5–15.5)
RDW: 17.5 % — ABNORMAL HIGH (ref 11.5–15.5)
WBC: 13.1 10*3/uL — ABNORMAL HIGH (ref 4.0–10.5)
WBC: 16.1 10*3/uL — ABNORMAL HIGH (ref 4.0–10.5)

## 2011-06-11 LAB — RPR: RPR Ser Ql: NONREACTIVE

## 2011-06-11 LAB — STREP B DNA PROBE

## 2011-06-11 MED ORDER — LACTATED RINGERS IV SOLN
INTRAVENOUS | Status: DC
  Administered 2011-06-11 (×2): via INTRAVENOUS

## 2011-06-11 MED ORDER — WITCH HAZEL-GLYCERIN EX PADS
1.0000 "application " | MEDICATED_PAD | CUTANEOUS | Status: DC | PRN
  Administered 2011-06-11: 1 via TOPICAL

## 2011-06-11 MED ORDER — CITRIC ACID-SODIUM CITRATE 334-500 MG/5ML PO SOLN: 30.0000 mL | ORAL | Status: DC | PRN

## 2011-06-11 MED ORDER — DIBUCAINE 1 % RE OINT: 1.0000 "application " | TOPICAL_OINTMENT | RECTAL | Status: DC | PRN

## 2011-06-11 MED ORDER — EPHEDRINE 5 MG/ML INJ
10.0000 mg | INTRAVENOUS | Status: DC | PRN
  Filled 2011-06-11: qty 4

## 2011-06-11 MED ORDER — ONDANSETRON HCL 4 MG/2ML IJ SOLN: 4.0000 mg | INTRAMUSCULAR | Status: DC | PRN

## 2011-06-11 MED ORDER — PHENYLEPHRINE 40 MCG/ML (10ML) SYRINGE FOR IV PUSH (FOR BLOOD PRESSURE SUPPORT)
80.0000 ug | PREFILLED_SYRINGE | INTRAVENOUS | Status: DC | PRN
Start: 2011-06-11 — End: 2011-06-11

## 2011-06-11 MED ORDER — OXYTOCIN 20 UNITS IN LACTATED RINGERS INFUSION - SIMPLE: 125.0000 mL/h | INTRAVENOUS | Status: DC | PRN

## 2011-06-11 MED ORDER — FENTANYL 2.5 MCG/ML BUPIVACAINE 1/10 % EPIDURAL INFUSION (WH - ANES)
14.0000 mL/h | INTRAMUSCULAR | Status: DC
Start: 2011-06-11 — End: 2011-06-11
  Administered 2011-06-11: 14 mL/h via EPIDURAL
  Filled 2011-06-11: qty 60

## 2011-06-11 MED ORDER — OXYCODONE-ACETAMINOPHEN 5-325 MG PO TABS
1.0000 | ORAL_TABLET | ORAL | Status: DC | PRN
  Administered 2011-06-11: 1 via ORAL
  Filled 2011-06-11: qty 1

## 2011-06-11 MED ORDER — LIDOCAINE HCL (PF) 1 % IJ SOLN
30.0000 mL | INTRAMUSCULAR | Status: DC | PRN
  Filled 2011-06-11: qty 30

## 2011-06-11 MED ORDER — DIPHENHYDRAMINE HCL 25 MG PO CAPS: 25.0000 mg | ORAL_CAPSULE | Freq: Four times a day (QID) | ORAL | Status: DC | PRN

## 2011-06-11 MED ORDER — OXYCODONE-ACETAMINOPHEN 5-325 MG PO TABS
1.0000 | ORAL_TABLET | ORAL | Status: DC | PRN
  Administered 2011-06-11 – 2011-06-12 (×2): 1 via ORAL
  Filled 2011-06-11 (×2): qty 1

## 2011-06-11 MED ORDER — SIMETHICONE 80 MG PO CHEW: 80.0000 mg | CHEWABLE_TABLET | ORAL | Status: DC | PRN

## 2011-06-11 MED ORDER — DIPHENHYDRAMINE HCL 50 MG/ML IJ SOLN: 12.5000 mg | INTRAMUSCULAR | Status: DC | PRN

## 2011-06-11 MED ORDER — LIDOCAINE HCL (PF) 1 % IJ SOLN
INTRAMUSCULAR | Status: DC | PRN
  Administered 2011-06-11 (×4): 5 mL

## 2011-06-11 MED ORDER — SODIUM CHLORIDE 0.9 % IV SOLN
2.0000 g | Freq: Four times a day (QID) | INTRAVENOUS | Status: DC
  Administered 2011-06-11: 2 g via INTRAVENOUS
  Filled 2011-06-11 (×2): qty 2000

## 2011-06-11 MED ORDER — NALBUPHINE SYRINGE 5 MG/0.5 ML
10.0000 mg | INJECTION | INTRAMUSCULAR | Status: DC | PRN
  Filled 2011-06-11: qty 1

## 2011-06-11 MED ORDER — ZOLPIDEM TARTRATE 5 MG PO TABS: 5.0000 mg | ORAL_TABLET | Freq: Every evening | ORAL | Status: DC | PRN

## 2011-06-11 MED ORDER — EPHEDRINE 5 MG/ML INJ: 10.0000 mg | INTRAVENOUS | Status: DC | PRN

## 2011-06-11 MED ORDER — BENZOCAINE-MENTHOL 20-0.5 % EX AERO
INHALATION_SPRAY | CUTANEOUS | Status: AC
  Administered 2011-06-11: 1 via TOPICAL
  Filled 2011-06-11: qty 56

## 2011-06-11 MED ORDER — LACTATED RINGERS IV SOLN: 500.0000 mL | Freq: Once | INTRAVENOUS | Status: DC

## 2011-06-11 MED ORDER — ONDANSETRON HCL 4 MG/2ML IJ SOLN: 4.0000 mg | Freq: Four times a day (QID) | INTRAMUSCULAR | Status: DC | PRN

## 2011-06-11 MED ORDER — BENZOCAINE-MENTHOL 20-0.5 % EX AERO
1.0000 "application " | INHALATION_SPRAY | CUTANEOUS | Status: DC | PRN
  Administered 2011-06-11: 1 via TOPICAL

## 2011-06-11 MED ORDER — ONDANSETRON HCL 4 MG PO TABS: 4.0000 mg | ORAL_TABLET | ORAL | Status: DC | PRN

## 2011-06-11 MED ORDER — ACETAMINOPHEN 325 MG PO TABS: 650.0000 mg | ORAL_TABLET | ORAL | Status: DC | PRN

## 2011-06-11 MED ORDER — SENNOSIDES-DOCUSATE SODIUM 8.6-50 MG PO TABS
2.0000 | ORAL_TABLET | Freq: Every day | ORAL | Status: DC
  Administered 2011-06-11 – 2011-06-12 (×2): 2 via ORAL

## 2011-06-11 MED ORDER — LANOLIN HYDROUS EX OINT: TOPICAL_OINTMENT | CUTANEOUS | Status: DC | PRN

## 2011-06-11 MED ORDER — IBUPROFEN 600 MG PO TABS
600.0000 mg | ORAL_TABLET | Freq: Four times a day (QID) | ORAL | Status: DC
  Administered 2011-06-11 – 2011-06-13 (×8): 600 mg via ORAL
  Filled 2011-06-11 (×7): qty 1

## 2011-06-11 MED ORDER — FLEET ENEMA 7-19 GM/118ML RE ENEM: 1.0000 | ENEMA | RECTAL | Status: DC | PRN

## 2011-06-11 MED ORDER — OXYTOCIN 20 UNITS IN LACTATED RINGERS INFUSION - SIMPLE
125.0000 mL/h | Freq: Once | INTRAVENOUS | Status: AC
  Administered 2011-06-11: 999 mL/h via INTRAVENOUS

## 2011-06-11 MED ORDER — IBUPROFEN 600 MG PO TABS
600.0000 mg | ORAL_TABLET | Freq: Four times a day (QID) | ORAL | Status: DC | PRN
  Administered 2011-06-11: 600 mg via ORAL
  Filled 2011-06-11: qty 1

## 2011-06-11 MED ORDER — PHENYLEPHRINE 40 MCG/ML (10ML) SYRINGE FOR IV PUSH (FOR BLOOD PRESSURE SUPPORT)
80.0000 ug | PREFILLED_SYRINGE | INTRAVENOUS | Status: DC | PRN
  Filled 2011-06-11: qty 5

## 2011-06-11 MED ORDER — PRENATAL MULTIVITAMIN CH
1.0000 | ORAL_TABLET | Freq: Every day | ORAL | Status: DC
  Administered 2011-06-11 – 2011-06-13 (×3): 1 via ORAL
  Filled 2011-06-11 (×3): qty 1

## 2011-06-11 MED ORDER — OXYTOCIN BOLUS FROM INFUSION
500.0000 mL | Freq: Once | INTRAVENOUS | Status: DC
  Filled 2011-06-11: qty 500
  Filled 2011-06-11 (×2): qty 1000

## 2011-06-11 MED ORDER — TETANUS-DIPHTH-ACELL PERTUSSIS 5-2.5-18.5 LF-MCG/0.5 IM SUSP
0.5000 mL | Freq: Once | INTRAMUSCULAR | Status: AC
  Administered 2011-06-12: 0.5 mL via INTRAMUSCULAR
  Filled 2011-06-11: qty 0.5

## 2011-06-11 MED ORDER — LACTATED RINGERS IV SOLN: 500.0000 mL | INTRAVENOUS | Status: DC | PRN

## 2011-06-11 NOTE — Anesthesia Preprocedure Evaluation (Signed)
Anesthesia Evaluation  Patient identified by MRN, date of birth, ID band Patient awake    Reviewed: Allergy & Precautions, H&P , Patient's Chart, lab work & pertinent test results  Airway Mallampati: III TM Distance: >3 FB Neck ROM: full    Dental No notable dental hx.    Pulmonary neg pulmonary ROS,  clear to auscultation  Pulmonary exam normal       Cardiovascular hypertension, neg cardio ROS regular Normal    Neuro/Psych Negative Neurological ROS  Negative Psych ROS   GI/Hepatic negative GI ROS, Neg liver ROS, GERD-  ,  Endo/Other  Negative Endocrine ROSDiabetes mellitus-  Renal/GU negative Renal ROS     Musculoskeletal   Abdominal   Peds  Hematology negative hematology ROS (+)   Anesthesia Other Findings   Reproductive/Obstetrics (+) Pregnancy                           Anesthesia Physical Anesthesia Plan  ASA: III  Anesthesia Plan: Epidural   Post-op Pain Management:    Induction:   Airway Management Planned:   Additional Equipment:   Intra-op Plan:   Post-operative Plan:   Informed Consent: I have reviewed the patients History and Physical, chart, labs and discussed the procedure including the risks, benefits and alternatives for the proposed anesthesia with the patient or authorized representative who has indicated his/her understanding and acceptance.     Plan Discussed with:   Anesthesia Plan Comments:         Anesthesia Quick Evaluation

## 2011-06-11 NOTE — Progress Notes (Signed)
SVD of viable female 

## 2011-06-11 NOTE — Progress Notes (Signed)
Pt may go to room 163. 

## 2011-06-11 NOTE — Progress Notes (Signed)
Dr. Clearance Coots notified of pt presenting with SROM.  Notified of VE and ctx pattern.  Notified of GDM and CHTN during pregnancy.  Notified of elevated BP's.  Admit orders received.

## 2011-06-11 NOTE — Progress Notes (Signed)
Melanie Moss is a 36 y.o. [id number] at [redacted]w[redacted]d by LMP admitted for rupture of membranes  Subjective:   Objective: BP 129/78  Pulse 92  Temp(Src) 98.5 F (36.9 C) (Oral)  Resp 20  Ht 5\' 3"  (1.6 m)  Wt 165 lb (74.844 kg)  BMI 29.23 kg/m2  SpO2 100%  LMP 09/27/2010  Breastfeeding? Unknown   Total I/O In: -  Out: 300 [Blood:300]  FHT:  FHR: 150 bpm, variability: moderate,  accelerations:  Present,  decelerations:  Absent UC:   regular, every 5 minutes SVE:   Dilation: 3 Effacement (%): 100 Station: 0 Exam by:: Humphrey Rolls, RN  Labs: Lab Results  Component Value Date   WBC 13.1* 06/11/2011   HGB 13.5 06/11/2011   HCT 40.0 06/11/2011   MCV 85.5 06/11/2011   PLT 310 06/11/2011    Assessment / Plan: Spontaneous labor, progressing normally  Labor: Progressing normally Preeclampsia:  n/a Fetal Wellbeing:  Category I  Pain Control:  Plan epidural. Anticipated MOD:  NSVD  Kiyara Bouffard A 06/11/2011, 6:22 AM

## 2011-06-11 NOTE — Anesthesia Procedure Notes (Addendum)
Epidural Patient location during procedure: OB Start time: 06/11/2011 4:05 AM  Staffing Anesthesiologist: Brayton Caves R Performed by: anesthesiologist   Preanesthetic Checklist Completed: patient identified, site marked, surgical consent, pre-op evaluation, timeout performed, IV checked, risks and benefits discussed and monitors and equipment checked  Epidural Patient position: sitting Prep: site prepped and draped and DuraPrep Patient monitoring: continuous pulse ox and blood pressure Approach: midline Injection technique: LOR air and LOR saline  Needle:  Needle type: Tuohy  Needle gauge: 17 G Needle length: 9 cm Needle insertion depth: 8 cm Catheter type: closed end flexible Catheter size: 19 Gauge Catheter at skin depth: 13 cm Test dose: negative  Assessment Events: blood not aspirated, injection not painful, no injection resistance, negative IV test and no paresthesia  Additional Notes Patient identified.  Risk benefits discussed including failed block, incomplete pain control, headache, nerve damage, paralysis, blood pressure changes, nausea, vomiting, reactions to medication both toxic or allergic, and postpartum back pain.  Patient expressed understanding and wished to proceed.  All questions were answered.  Sterile technique used throughout procedure and epidural site dressed with sterile barrier dressing. No paresthesia or other complications noted.The patient did not experience any signs of intravascular injection such as tinnitus or metallic taste in mouth nor signs of intrathecal spread such as rapid motor block. Please see nursing notes for vital signs.   Epidural Patient location during procedure: OB Start time: 06/11/2011 4:52 AM  Staffing Anesthesiologist: Brayton Caves R Performed by: anesthesiologist   Preanesthetic Checklist Completed: patient identified, site marked, surgical consent, pre-op evaluation, timeout performed, IV checked, risks and  benefits discussed and monitors and equipment checked  Epidural Patient position: sitting Prep: site prepped and draped and DuraPrep Patient monitoring: continuous pulse ox and blood pressure Approach: midline Injection technique: LOR air and LOR saline  Needle:  Needle type: Tuohy  Needle gauge: 17 G Needle length: 9 cm Needle insertion depth: 7 cm Catheter type: closed end flexible Catheter size: 19 Gauge Catheter at skin depth: 12 cm Test dose: negative  Assessment Events: blood not aspirated, injection not painful, no injection resistance, negative IV test and no paresthesia  Additional Notes Patient identified.  Risk benefits discussed including failed block, incomplete pain control, headache, nerve damage, paralysis, blood pressure changes, nausea, vomiting, reactions to medication both toxic or allergic, and postpartum back pain.  Patient expressed understanding and wished to proceed.  All questions were answered.  Sterile technique used throughout procedure and epidural site dressed with sterile barrier dressing. No paresthesia or other complications noted.The patient did not experience any signs of intravascular injection such as tinnitus or metallic taste in mouth nor signs of intrathecal spread such as rapid motor block. Please see nursing notes for vital signs.

## 2011-06-11 NOTE — H&P (Signed)
Melanie Moss is a 36 y.o. female presenting for SROM AND UC's.. Maternal Medical History:  Reason for admission: Reason for admission: rupture of membranes and contractions.  Contractions: Onset was 3-5 hours ago.   Frequency: regular.   Duration is approximately 1 minute.    Fetal activity: Perceived fetal activity is normal.   Last perceived fetal movement was within the past hour.    Prenatal complications: no prenatal complications Prenatal Complications - Diabetes: none.    OB History    Grav Para Term Preterm Abortions TAB SAB Ect Mult Living   6 4 0 4 1 0 1 0 0 1      Past Medical History  Diagnosis Date  . Hypertension   . Preterm labor   . GERD (gastroesophageal reflux disease)   . Anemia   . Hyperlipidemia   . Gestational diabetes    Past Surgical History  Procedure Date  . Dilation and curettage of uterus   . Cervical cerclage 02/19/2011    Procedure: CERCLAGE CERVICAL;  Surgeon: Roseanna Rainbow, MD;  Location: [facility];  Service: Gynecology;  Laterality: N/A;   Family History: family history includes Hypertension in her father and mother. Social History:  reports that she has never smoked. She has never used smokeless tobacco. She reports that she does not drink alcohol or use illicit drugs.  Review of Systems  All other systems reviewed and are negative.    Dilation: 3 Effacement (%): 100 Station: 0 Exam by:: Humphrey Rolls, RN Blood pressure 129/78, pulse 92, temperature 98.5 F (36.9 C), temperature source Oral, resp. rate 20, height 5\' 3"  (1.6 m), weight 165 lb (74.844 kg), last menstrual period 09/27/2010, SpO2 100.00%, unknown if currently breastfeeding. Maternal Exam:  Uterine Assessment: Contraction strength is firm.  Contraction duration is 1 minute. Contraction frequency is regular.   Abdomen: Patient reports no abdominal tenderness. Fetal presentation: vertex  Introitus: Normal vulva. Normal vagina.    Physical Exam  Nursing note and  vitals reviewed. Constitutional: She appears well-developed and well-nourished.  Neck: Normal range of motion. Neck supple.  Cardiovascular: Normal rate and regular rhythm.   Respiratory: Effort normal.  GI: Soft.  Genitourinary: Vagina normal and uterus normal.  Musculoskeletal: Normal range of motion.  Skin: Skin is warm and dry.    Prenatal labs: ABO, Rh: O/Positive/-- (12/19 0000) Antibody: NEG (09/10 2039) Rubella: Immune (12/19 0000) RPR: Nonreactive (12/19 0000)  HBsAg: Negative (07/27 0000)  HIV: Non-reactive (07/27 0000)  GBS: Unknown (02/18 0000)   Assessment/Plan: 35 weeks.  SROM.  Early labor.   Fabiana Dromgoole A 06/11/2011, 6:07 AM

## 2011-06-12 LAB — CBC
HCT: 37.8 % (ref 36.0–46.0)
Hemoglobin: 12.4 g/dL (ref 12.0–15.0)
MCH: 28.4 pg (ref 26.0–34.0)
MCHC: 32.8 g/dL (ref 30.0–36.0)
MCV: 86.7 fL (ref 78.0–100.0)
Platelets: 287 10*3/uL (ref 150–400)
RBC: 4.36 MIL/uL (ref 3.87–5.11)
RDW: 18 % — ABNORMAL HIGH (ref 11.5–15.5)
WBC: 14.5 10*3/uL — ABNORMAL HIGH (ref 4.0–10.5)

## 2011-06-12 MED ORDER — NEBIVOLOL HCL 10 MG PO TABS
10.0000 mg | ORAL_TABLET | Freq: Every day | ORAL | Status: DC
  Administered 2011-06-12 – 2011-06-13 (×2): 10 mg via ORAL
  Filled 2011-06-12 (×2): qty 1

## 2011-06-12 NOTE — Progress Notes (Signed)
Post Partum Day 1 Subjective: no complaints  Objective: Blood pressure 132/89, pulse 86, temperature 98.2 F (36.8 C), temperature source Oral, resp. rate 18, height 5\' 3"  (1.6 m), weight 165 lb (74.844 kg), last menstrual period 09/27/2010, SpO2 98.00%, unknown if currently breastfeeding.  Physical Exam:  General: alert and no distress Lochia: appropriate Uterine Fundus: firm Incision: healing well DVT Evaluation: No evidence of DVT seen on physical exam.   Basename 06/12/11 0532 06/11/11 0859  HGB 12.4 12.4  HCT 37.8 37.6    Assessment/Plan: Plan for discharge tomorrow   LOS: 1 day   Jesseka Drinkard A 06/12/2011, 9:34 AM

## 2011-06-12 NOTE — Addendum Note (Signed)
Addendum  created 06/12/11 0954 by Suella Grove, CRNA   Modules edited:Charges VN, Notes Section

## 2011-06-12 NOTE — Anesthesia Postprocedure Evaluation (Signed)
  Anesthesia Post-op Note  Patient: Melanie Moss  Procedure(s) Performed: * No procedures listed *  Patient Location: Mother/Baby  Anesthesia Type: Epidural  Level of Consciousness: awake  Airway and Oxygen Therapy: Patient Spontanous Breathing  Post-op Pain: none  Post-op Assessment: Patient's Cardiovascular Status Stable, Respiratory Function Stable, Patent Airway, No signs of Nausea or vomiting, Adequate PO intake, Pain level controlled, No headache, No backache, No residual numbness and No residual motor weakness  Post-op Vital Signs: Reviewed and stable  Complications: No apparent anesthesia complications

## 2011-06-13 MED ORDER — OXYCODONE-ACETAMINOPHEN 5-325 MG PO TABS: 1.0000 | ORAL_TABLET | ORAL | Status: DC | PRN

## 2011-06-13 MED ORDER — IBUPROFEN 600 MG PO TABS: 600.0000 mg | ORAL_TABLET | Freq: Four times a day (QID) | ORAL | Status: DC

## 2011-06-13 NOTE — Progress Notes (Signed)
PSYCHOSOCIAL ASSESSMENT ~ MATERNAL/CHILD Name: Melanie Moss.                                                                                     Age: 36   Referral Date: 06/12/11 Reason/Source: NICU Support/NICU  I. FAMILY/HOME ENVIRONMENT Child's Legal Guardian _x__Parent(s) ___Grandparent ___Foster parent ___DSS_________________ Name: Catha Brow                                                              DOB: 20-Aug-1975                     Age: 35  Address: [address] [place], [place] [address]  Name: Rosine Beat Sr.                                                               DOB: //                     Age:   Address: same  Other Household Members/Support Persons Name:                                         Relationship:                        DOB ___/___/___                   Name:                                         Relationship:                        DOB ___/___/___                   Name:                                         Relationship:                        DOB ___/___/___                   Name:  Relationship:                        DOB ___/___/___  C. Other Support: good support system of family and friends   PSYCHOSOCIAL DATA Information Source                                                                                             _x_Patient Interview  _x_Family Interview           _x_Other: chart  Financial and Community Resources _x_Employment: MOB worked for [employer] until she was terminated due to [employer] running out.  FOB works for [employer] _x_Medicaid    [place]: [place]                _x_Private Insurance: FOB plans to put baby on his insurance                   __Self Pay  __Food Stamps   __WIC __Work First     __Public Housing     __Section 8    __Maternity Care Coordination/Child Service Coordination/Early Intervention  __School:                                                                         Grade:    __Other:   Insurance risk surveyor and Environment Information Cultural Issues Impacting Care: none known  STRENGTHS __x_Supportive family/friends __x_Adequate Resources __x_Compliance with medical plan __x_Home prepared for Child (including basic supplies) __x_Understanding of illness      __x_Other: has pediatrician list, but has not chosen a pediatrician yet. RISK FACTORS AND CURRENT PROBLEMS         __x__No Problems Noted  Pt              Family     Substance Abuse                                                                ___              ___        Mental Illness                                                                        ___              ___  Family/Relationship Issues                                      ___               ___             Abuse/Neglect/Domestic Violence                                         ___         ___  Financial Resources                                        ___              ___             Transportation                                                                        ___               ___  DSS Involvement                                                                   ___              ___  Adjustment to Illness  ___              ___  Knowledge/Cognitive Deficit                                                   ___              ___             Compliance with Treatment                                                 ___              ___  Basic Needs (food, housing, etc.)                                          ___              ___             Housing Concerns                                       ___               ___ Other_____________________________________________________________            SOCIAL WORK ASSESSMENT SW met with parents in MOB's first floor room to complete assessment and evaluate how they are coping with baby's admission to NICU.  MOB met FOB for the first time today, but knows MOB from her time in the antenatal unit during pregnancy.  Parents are extremely pleasant and seem to be coping very well with the entire situation.  FOB appears to be very supportive and MOB states that he was a huge help while she was on bed rest.  This is their first baby.  MOB currently has long term disability and plans to see if she can go back to work at [employer] after maternity leave.  They report having good supports and everything they need for baby at home.  They are "elated" about baby's arrival.  SW explained baby's eligibility for SSI based on gestational age and weight.  Parents are interested in applying.  SW assisted.  SW explained support services offered by NICU SWs and gave contact information.  Parents were appreciative.  SW has no social concerns at this time.  SOCIAL WORK PLAN  ___No Further Intervention Required/No Barriers to Discharge   __x_Psychosocial Support and Ongoing Assessment of Needs   ___Patient/Family Education:   ___Child Protective Services Report   County___________ Date___/____/____   ___Information/Referral to [employer]_________________________   ___Other: SSI

## 2011-06-13 NOTE — Progress Notes (Signed)
Post Partum Day 2 Subjective: no complaints  Objective: Blood pressure 142/91, pulse 74, temperature 98 F (36.7 C), temperature source Axillary, resp. rate 20, height 5\' 3"  (1.6 m), weight 165 lb (74.844 kg), last menstrual period 09/27/2010, SpO2 98.00%, unknown if currently breastfeeding.  Physical Exam:  General: alert and no distress Lochia: appropriate Uterine Fundus: firm Incision: healing well DVT Evaluation: No evidence of DVT seen on physical exam.   Basename 06/12/11 0532 06/11/11 0859  HGB 12.4 12.4  HCT 37.8 37.6    Assessment/Plan: Discharge home   LOS: 2 days   Tahmid Stonehocker A 06/13/2011, 8:55 AM

## 2011-06-13 NOTE — Discharge Summary (Signed)
Obstetric Discharge Summary Reason for Admission: Incompetent cervix.   Prenatal Procedures: cerclage and ultrasound Intrapartum Procedures: spontaneous vaginal delivery Postpartum Procedures: none Complications-Operative and Postpartum: none Hemoglobin  Date Value Range Status  06/12/2011 12.4  12.0-15.0 (g/dL) Final     HCT  Date Value Range Status  06/12/2011 37.8  36.0-46.0 (%) Final    Discharge Diagnoses: Incompetent cervix, Premature labor and Preterm delivery.  Discharge Information: Date: 06/13/2011 Activity: pelvic rest Diet: routine Medications: PNV, Ibuprofen, Colace and Percocet Condition: stable Instructions: refer to practice specific booklet Discharge to: home Follow-up Information    Follow up with Shante Maysonet A, MD. Schedule an appointment as soon as possible for a visit in 6 weeks.   Contact information:   [address] [place] [address] [phone]          Newborn Data: Live born female  Birth Weight: 3 lb 10.9 oz (1670 g) APGAR: 9, 9  Baby in NICU for prematurity.Marland Kitchen  Tarek Cravens A 06/13/2011, 9:03 AM

## 2011-08-02 ENCOUNTER — Other Ambulatory Visit: Payer: Self-pay | Admitting: Obstetrics & Gynecology

## 2011-08-03 ENCOUNTER — Other Ambulatory Visit: Payer: Self-pay | Admitting: Obstetrics & Gynecology

## 2011-09-07 ENCOUNTER — Ambulatory Visit: Admit: 2011-09-07 | Payer: Self-pay | Admitting: Obstetrics & Gynecology

## 2011-09-07 SURGERY — LIGATION, FALLOPIAN TUBE, LAPAROSCOPIC
Anesthesia: General | Laterality: Bilateral

## 2012-08-01 ENCOUNTER — Encounter: Payer: Self-pay | Admitting: Obstetrics & Gynecology

## 2012-08-01 ENCOUNTER — Ambulatory Visit (INDEPENDENT_AMBULATORY_CARE_PROVIDER_SITE_OTHER): Payer: Managed Care, Other (non HMO) | Admitting: Obstetrics & Gynecology

## 2012-08-01 VITALS — BP 149/93 | HR 69 | Temp 97.4°F | Ht 63.0 in | Wt 150.0 lb

## 2012-08-01 DIAGNOSIS — N925 Other specified irregular menstruation: Secondary | ICD-10-CM

## 2012-08-01 DIAGNOSIS — D259 Leiomyoma of uterus, unspecified: Secondary | ICD-10-CM

## 2012-08-01 DIAGNOSIS — N938 Other specified abnormal uterine and vaginal bleeding: Secondary | ICD-10-CM

## 2012-08-01 DIAGNOSIS — Z01419 Encounter for gynecological examination (general) (routine) without abnormal findings: Secondary | ICD-10-CM

## 2012-08-01 DIAGNOSIS — N949 Unspecified condition associated with female genital organs and menstrual cycle: Secondary | ICD-10-CM

## 2012-08-01 DIAGNOSIS — N926 Irregular menstruation, unspecified: Secondary | ICD-10-CM

## 2012-08-01 LAB — HEMOGLOBIN AND HEMATOCRIT, BLOOD
HCT: 29.4 % — ABNORMAL LOW (ref 36.0–46.0)
Hemoglobin: 9.5 g/dL — ABNORMAL LOW (ref 12.0–15.0)

## 2012-08-01 MED ORDER — NORETHINDRONE 0.35 MG PO TABS: 1.0000 | ORAL_TABLET | Freq: Every day | ORAL | Status: DC

## 2012-08-01 NOTE — Progress Notes (Signed)
.   Subjective:     Melanie Moss is a 37 y.o. female here for a routine exam.  Current complaints of abnormal bleeding due to fibroids. Personal health questionnaire reviewed: no.   Gynecologic History Patient's last menstrual period was 07/24/2012. Contraception: condoms Last Pap: 11/2010. Results were: ASCUS Last mammogram: N/A   Obstetric History OB History   Grav Para Term Preterm Abortions TAB SAB Ect Mult Living   6 4 0 4 1 0 1 0 0 1      # Outc Date GA Lbr Len/2nd Wgt Sex Del Anes PTL Lv   1 PRE 2/13 [redacted]w[redacted]d 03:40 / 00:11  M SVD EPI  Yes   2 PRE            3 SAB            4 PRE            5 PRE            6 GRA            Comments: Current       The following portions of the patient's history were reviewed and updated as appropriate: allergies, current medications, past family history, past medical history, past social history, past surgical history and problem list.  Review of Systems Pertinent items are noted in HPI.    Objective:    General appearance: alert Breasts: normal appearance, no masses or tenderness Abdomen: soft, non-tender; bowel sounds normal; no masses,  no organomegaly Pelvic: cervix normal in appearance, external genitalia normal, no adnexal masses or tenderness, uterus normal size, shape, and consistency and vagina normal without discharge    Assessment:    Healthy female exam.  Small symptomatic fibroid uterus with secondary abnormal uterine bleeding   Plan:   Mini pill Keep menstrual calendar Return in several months

## 2012-08-01 NOTE — Patient Instructions (Addendum)
Fibroids Fibroids are lumps (tumors) that can occur any place in a woman's body. These lumps are not cancerous. Fibroids vary in size, weight, and where they grow. HOME CARE  Do not take aspirin.  Write down the number of pads or tampons you use during your period. Tell your doctor. This can help determine the best treatment for you. GET HELP RIGHT AWAY IF:  You have pain in your lower belly (abdomen) that is not helped with medicine.  You have cramps that are not helped with medicine.  You have more bleeding between or during your period.  You feel lightheaded or pass out (faint).  Your lower belly pain gets worse. MAKE SURE YOU:  Understand these instructions.  Will watch your condition.  Will get help right away if you are not doing well or get worse. Document Released: 05/12/2010 Document Revised: 07/02/2011 Document Reviewed: 05/12/2010 ExitCare Patient Information 2013 [employer].  

## 2012-08-02 LAB — HIV ANTIBODY (ROUTINE TESTING W REFLEX): HIV: NONREACTIVE

## 2012-08-04 ENCOUNTER — Encounter: Payer: Self-pay | Admitting: Obstetrics & Gynecology

## 2012-08-05 LAB — PAP IG, CT-NG NAA, HPV HIGH-RISK
Chlamydia Probe Amp: NEGATIVE
GC Probe Amp: NEGATIVE
HPV DNA High Risk: NOT DETECTED

## 2012-08-12 ENCOUNTER — Telehealth: Payer: Self-pay | Admitting: *Deleted

## 2012-08-12 NOTE — Telephone Encounter (Signed)
Error

## 2012-08-12 NOTE — Telephone Encounter (Signed)
Pt has been informed that Dr. Tamela Oddi has indicated that at this time we would not be able to fill out the [facility] paperwork.

## 2012-09-24 ENCOUNTER — Ambulatory Visit: Payer: Self-pay | Admitting: Obstetrics & Gynecology

## 2012-10-01 ENCOUNTER — Ambulatory Visit (INDEPENDENT_AMBULATORY_CARE_PROVIDER_SITE_OTHER): Payer: 59 | Admitting: Obstetrics & Gynecology

## 2012-10-01 ENCOUNTER — Encounter: Payer: Self-pay | Admitting: Obstetrics & Gynecology

## 2012-10-01 VITALS — BP 154/91 | HR 105 | Temp 98.3°F | Ht 63.0 in | Wt 148.0 lb

## 2012-10-01 DIAGNOSIS — D259 Leiomyoma of uterus, unspecified: Secondary | ICD-10-CM

## 2012-10-01 DIAGNOSIS — D649 Anemia, unspecified: Secondary | ICD-10-CM

## 2012-10-01 DIAGNOSIS — N926 Irregular menstruation, unspecified: Secondary | ICD-10-CM

## 2012-10-01 NOTE — Patient Instructions (Addendum)
Hysteroscopy Hysteroscopy is a procedure used for looking inside the womb (uterus). It may be done for many different reasons, including:  To evaluate abnormal bleeding, fibroid (benign, noncancerous) tumors, polyps, scar tissue (adhesions), and possibly cancer of the uterus.  To look for lumps (tumors) and other uterine growths.  To look for causes of why a woman cannot get pregnant (infertility), causes of recurrent loss of pregnancy (miscarriages), or a lost intrauterine device (IUD).  To perform a sterilization by blocking the fallopian tubes from inside the uterus. A hysteroscopy should be done right after a menstrual period to be sure you are not pregnant. LET YOUR CAREGIVER KNOW ABOUT:   Allergies.  Medicines taken, including herbs, eyedrops, over-the-counter medicines, and creams.  Use of steroids (by mouth or creams).  Previous problems with anesthetics or numbing medicines.  History of bleeding or blood problems.  History of blood clots.  Possibility of pregnancy, if this applies.  Previous surgery.  Other health problems. RISKS AND COMPLICATIONS   Putting a hole in the uterus.  Excessive bleeding.  Infection.  Damage to the cervix.  Injury to other organs.  Allergic reaction to medicines.  Too much fluid used in the uterus for the procedure. BEFORE THE PROCEDURE   Do not take aspirin or blood thinners for a week before the procedure, or as directed. It can cause bleeding.  Arrive at least 60 minutes before the procedure or as directed to read and sign the necessary forms.  Arrange for someone to take you home after the procedure.  If you smoke, do not smoke for 2 weeks before the procedure. PROCEDURE   Your caregiver may give you medicine to relax you. He or she may also give you a medicine that numbs the area around the cervix (local anesthetic) or a medicine that makes you sleep (general anesthesia).  Sometimes, a medicine is placed in the cervix  the day before the procedure. This medicine makes the cervix have a larger opening (dilate). This makes it easier for the instrument to be inserted into the uterus.  A small instrument (hysteroscope) is inserted through the vagina into the uterus. This instrument is similar to a pencil-sized telescope with a light.  During the procedure, air or a liquid is put into the uterus, which allows the surgeon to see better.  Sometimes, tissue is gently scraped from inside the uterus. These tissue samples are sent to a specialist who looks at tissue samples (pathologist). The pathologist will give a report to your caregiver. This will help your caregiver decide if further treatment is necessary. The report will also help your caregiver decide on the best treatment if the test comes back abnormal. AFTER THE PROCEDURE   If you had a general anesthetic, you may be groggy for a couple hours after the procedure.  If you had a local anesthetic, you will be advised to rest at the surgical center or caregiver's office until you are stable and feel ready to go home.  You may have some cramping for a couple days.  You may have bleeding, which varies from light spotting for a few days to menstrual-like bleeding for up to 3 to 7 days. This is normal.  Have someone take you home. FINDING OUT THE RESULTS OF YOUR TEST Not all test results are available during your visit. If your test results are not back during the visit, make an appointment with your caregiver to find out the results. Do not assume everything is normal if you   have not heard from your caregiver or the medical facility. It is important for you to follow up on all of your test results. HOME CARE INSTRUCTIONS   Do not drive for 24 hours or as instructed.  Only take over-the-counter or prescription medicines for pain, discomfort, or fever as directed by your caregiver.  Do not take aspirin. It can cause or aggravate bleeding.  Do not drive or drink  alcohol while taking pain medicine.  You may resume your usual diet.  Do not use tampons, douche, or have sexual intercourse for 2 weeks, or as advised by your caregiver.  Rest and sleep for the first 24 to 48 hours.  Take your temperature twice a day for 4 to 5 days. Write it down. Give these temperatures to your caregiver if they are abnormal (above 98.6 F or 37.0 C).  Take medicines your caregiver has ordered as directed.  Follow your caregiver's advice regarding diet, exercise, lifting, driving, and general activities.  Take showers instead of baths for 2 weeks, or as recommended by your caregiver.  If you develop constipation:  Take a mild laxative with the advice of your caregiver.  Eat bran foods.  Drink enough water and fluids to keep your urine clear or pale yellow.  Try to have someone with you or available to you for the first 24 to 48 hours, especially if you had a general anesthetic.  Make sure you and your family understand everything about your operation and recovery.  Follow your caregiver's advice regarding follow-up appointments and Pap smears. SEEK MEDICAL CARE IF:   You feel dizzy or lightheaded.  You feel sick to your stomach (nauseous).  You develop abnormal vaginal discharge.  You develop a rash.  You have an abnormal reaction or allergy to your medicine.  You need stronger pain medicine. SEEK IMMEDIATE MEDICAL CARE IF:   Bleeding is heavier than a normal menstrual period or you have blood clots.  You have an oral temperature above 102 F (38.9 C), not controlled by medicine.  You have increasing cramps or pains not relieved with medicine.  You develop belly (abdominal) pain that does not seem to be related to the same area of earlier cramping and pain.  You pass out.  You develop pain in the tops of your shoulders (shoulder strap areas).  You develop shortness of breath. MAKE SURE YOU:   Understand these instructions.  Will watch  your condition.  Will get help right away if you are not doing well or get worse. Document Released: 07/16/2000 Document Revised: 07/02/2011 Document Reviewed: 11/08/2008 ExitCare Patient Information 2014 [employer].  

## 2012-10-01 NOTE — Progress Notes (Signed)
.   Subjective:     Melanie Moss is a 37 y.o. female here for a follow up exam.  Current complaints: She has taken the mini pill since April with no change in her cycles.  Her last cycle lasted 9 days with a heavy to light flow.   Personal health questionnaire reviewed: yes.   Gynecologic History Patient's last menstrual period was 09/10/2012. Last Pap: 07/2012. Results were: ASCUS Last mammogram: 2012. Results were: normal  Obstetric History OB History   Grav Para Term Preterm Abortions TAB SAB Ect Mult Living   6 4 0 4 1 0 1 0 0 1      # Outc Date GA Lbr Len/2nd Wgt Sex Del Anes PTL Lv   1 PRE 2/13 [redacted]w[redacted]d 03:40 / 00:11  M SVD EPI  Yes   2 PRE            3 SAB            4 PRE            5 PRE            6 GRA            Comments: Current       The following portions of the patient's history were reviewed and updated as appropriate: allergies, current medications, past family history, past medical history, past social history, past surgical history and problem list.  Review of Systems Pertinent items are noted in HPI.    Objective:     No exam today     Assessment:    AUB refractory to OCP  Plan:  A D&C/hysteroscopy is planned Return postop

## 2012-10-02 ENCOUNTER — Encounter: Payer: Self-pay | Admitting: Obstetrics & Gynecology

## 2012-10-03 ENCOUNTER — Other Ambulatory Visit: Payer: Self-pay | Admitting: *Deleted

## 2012-10-17 ENCOUNTER — Ambulatory Visit: Admit: 2012-10-17 | Payer: Self-pay | Admitting: Obstetrics & Gynecology

## 2012-10-17 SURGERY — DILATATION AND CURETTAGE /HYSTEROSCOPY
Anesthesia: Choice

## 2013-02-26 ENCOUNTER — Other Ambulatory Visit: Payer: Self-pay

## 2014-02-05 ENCOUNTER — Other Ambulatory Visit: Payer: Self-pay

## 2014-02-22 ENCOUNTER — Encounter: Payer: Self-pay | Admitting: Obstetrics & Gynecology

## 2014-04-19 ENCOUNTER — Encounter: Payer: Self-pay | Admitting: *Deleted

## 2014-04-20 ENCOUNTER — Encounter: Payer: Self-pay | Admitting: Obstetrics & Gynecology

## 2015-04-10 ENCOUNTER — Ambulatory Visit (INDEPENDENT_AMBULATORY_CARE_PROVIDER_SITE_OTHER): Payer: BLUE CROSS/BLUE SHIELD | Admitting: Family Medicine

## 2015-04-10 VITALS — BP 145/89 | HR 97 | Temp 98.0°F | Resp 18 | Ht 64.0 in | Wt 147.0 lb

## 2015-04-10 DIAGNOSIS — J069 Acute upper respiratory infection, unspecified: Secondary | ICD-10-CM | POA: Diagnosis not present

## 2015-04-10 DIAGNOSIS — J0100 Acute maxillary sinusitis, unspecified: Secondary | ICD-10-CM

## 2015-04-10 DIAGNOSIS — R05 Cough: Secondary | ICD-10-CM | POA: Diagnosis not present

## 2015-04-10 DIAGNOSIS — R059 Cough, unspecified: Secondary | ICD-10-CM

## 2015-04-10 MED ORDER — HYDROCODONE-HOMATROPINE 5-1.5 MG/5ML PO SYRP: 5.0000 mL | ORAL_SOLUTION | Freq: Every evening | ORAL | Status: DC | PRN

## 2015-04-10 MED ORDER — FLUCONAZOLE 150 MG PO TABS: 150.0000 mg | ORAL_TABLET | Freq: Once | ORAL | Status: DC

## 2015-04-10 MED ORDER — AMOXICILLIN-POT CLAVULANATE 875-125 MG PO TABS: 1.0000 | ORAL_TABLET | Freq: Two times a day (BID) | ORAL | Status: DC

## 2015-04-10 MED ORDER — BENZONATATE 200 MG PO CAPS: 200.0000 mg | ORAL_CAPSULE | Freq: Three times a day (TID) | ORAL | Status: DC | PRN

## 2015-04-10 NOTE — Patient Instructions (Signed)

## 2015-04-10 NOTE — Progress Notes (Signed)
Chief Complaint:  Chief Complaint  Patient presents with  . Cough    x 2 week  . Sinusitis    HPI: Melanie Moss is a 39 y.o. 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  He is better. Tried mucinex, sidafed, tylenol cold and sinus. She has a 40 yo who is sick. He is better.  She has sinus sxs and cough, no wheezing, lots of coughing productive and now clear to yellow. No sore throat. No ear pain.    Past Medical History  Diagnosis Date  . Hypertension   . Preterm labor   . GERD (gastroesophageal reflux disease)   . Anemia   . Hyperlipidemia   . Gestational diabetes    Past Surgical History  Procedure Laterality Date  . Dilation and curettage of uterus    . Cervical cerclage  02/19/2011    Procedure: CERCLAGE CERVICAL;  Surgeon: Agnes Lawrence, MD;  Location: [facility];  Service: Gynecology;  Laterality: N/A;   Social History   Social History  . Marital Status: Single    Spouse Name: N/A  . Number of Children: N/A  . Years of Education: N/A   Social History Main Topics  . Smoking status: Never Smoker   . Smokeless tobacco: Never Used  . Alcohol Use: No  . Drug Use: No  . Sexual Activity: Yes   Other Topics Concern  . None   Social History Narrative   Family History  Problem Relation Age of Onset  . Hypertension Mother   . Hypertension Father    No Known Allergies Prior to Admission medications   Medication Sig Start Date End Date Taking? Authorizing Provider  nebivolol (BYSTOLIC) 10 MG tablet Take 10 mg by mouth daily.   Yes Historical Provider, MD  amLODipine-olmesartan (AZOR) 5-20 MG per tablet Take 1 tablet by mouth daily. Reported on 04/10/2015    Historical Provider, MD  ibuprofen (ADVIL,MOTRIN) 600 MG tablet Take 1 tablet (600 mg total) by mouth every 6 (six) hours. 06/13/11 06/23/11  Shelly Bombard, MD  ibuprofen (MOTRIN IB) 200  MG tablet Take 3 tablets (600 mg total) by mouth every 6 (six) hours as needed for pain (take 4 times/day for 2 days). 02/19/11 03/01/11  Lahoma Crocker, MD  norethindrone (MICRONOR,CAMILA,ERRIN) 0.35 MG tablet Take 1 tablet (0.35 mg total) by mouth daily. Patient not taking: Reported on 04/10/2015 08/01/12   Lahoma Crocker, MD  oxyCODONE-acetaminophen (PERCOCET) 5-325 MG per tablet Take 1-2 tablets by mouth every 3 (three) hours as needed (moderate - severe pain). 06/13/11 06/23/11  Shelly Bombard, MD     ROS: The patient denies fevers, chills, night sweats, unintentional weight loss, chest pain, palpitations, wheezing, dyspnea on exertion, nausea, vomiting, abdominal pain, dysuria, hematuria, melena, numbness, weakness, or tingling.   All other systems have been reviewed and were otherwise negative with the exception of those mentioned in the HPI and as above.    PHYSICAL EXAM: Filed Vitals:   04/10/15 1242  BP: 145/89  Pulse: 97  Temp: 98 F (36.7 C)  Resp: 18   Body mass index is 25.22 kg/(m^2).   General: Alert, no acute distress HEENT:  Normocephalic, atraumatic, oropharynx patent. EOMI, PERRLA Erythematous throat, no exudates, TM normal, + sinus tenderness, + erythematous/boggy nasal mucosa Cardiovascular:  Regular rate and rhythm, no rubs murmurs or gallops.  No Carotid bruits, radial pulse intact. No pedal edema.  Respiratory: Clear to auscultation bilaterally.  No wheezes, rales, or rhonchi.  No cyanosis, no use of accessory musculature Abdominal: No organomegaly, abdomen is soft and non-tender, positive bowel sounds. No masses. Skin: No rashes. Neurologic: Facial musculature symmetric. Psychiatric: Patient acts appropriately throughout our interaction. Lymphatic: No cervical or submandibular lymphadenopathy Musculoskeletal: Gait intact. No edema, tenderness   LABS: Results for orders placed or performed in visit on 08/01/12  HIV antibody  Result Value Ref Range     HIV NON REACTIVE NON REACTIVE  Hemoglobin and Hematocrit, Blood  Result Value Ref Range   Hemoglobin 9.5 (L) 12.0 - 15.0 g/dL   HCT 29.4 (L) 36.0 - 46.0 %  Pap IG, CT/NG NAA, and HPV (high risk)  Result Value Ref Range   HPV DNA High Risk Not Detected    Specimen adequacy:     FINAL DIAGNOSIS: (A)    COMMENTS:     RECOMMENDATIONS:     Cytotechnologist:     Pathologist:     Chlamydia Probe Amp NEGATIVE    GC Probe Amp NEGATIVE      EKG/XRAY:   Primary read interpreted by Dr. Marin Comment at [facility].   ASSESSMENT/PLAN: Encounter Diagnoses  Name Primary?  . Acute maxillary sinusitis, recurrence not specified Yes  . Cough   . Acute upper respiratory infection    Rx Augmentin Rx Tessalon perles and also Hycodan with precautions Advise that most likely viral and at tail end so would jsut d sxs treatment, if no improvement then take abx Offered xray but decline at this time since no  SOB, CP wheezing Fu prn   Gross sideeffects, risk and benefits, and alternatives of medications d/w patient. Patient is aware that all medications have potential sideeffects and we are unable to predict every sideeffect or drug-drug interaction that may occur.  Suhaib Guzzo DO  04/10/2015 1:46 PM

## 2015-04-21 ENCOUNTER — Telehealth: Payer: Self-pay

## 2015-04-21 NOTE — Telephone Encounter (Signed)
Letter ready. Pt notified. 

## 2015-04-21 NOTE — Telephone Encounter (Signed)
Patient needs oow note for Wednesday and rtw on Thursday 22 Dec.    [phone]

## 2015-04-24 HISTORY — PX: MASTECTOMY: SHX3

## 2015-07-01 ENCOUNTER — Other Ambulatory Visit: Payer: Self-pay

## 2015-07-01 ENCOUNTER — Telehealth: Payer: Self-pay | Admitting: *Deleted

## 2015-07-01 DIAGNOSIS — Z1231 Encounter for screening mammogram for malignant neoplasm of breast: Secondary | ICD-10-CM

## 2015-07-01 NOTE — Telephone Encounter (Signed)
Patient is calling for a referral for a mammogram- she has not been to the office since 09/2012 at that time she was a [place] patient.

## 2015-07-01 NOTE — Telephone Encounter (Signed)
Needs appointment for annual and schedule mammogram at that time.

## 2015-07-04 ENCOUNTER — Telehealth: Payer: Self-pay | Admitting: Obstetrics

## 2015-07-04 NOTE — Telephone Encounter (Signed)
Attempt to call pt. Melanie Moss about call back to office for Annual Exam.   Kiyana Vazguez

## 2015-07-04 NOTE — Telephone Encounter (Signed)
Mar[facility] tried to call patient- left her a message.

## 2015-07-13 ENCOUNTER — Other Ambulatory Visit: Payer: Self-pay | Admitting: Obstetrics & Gynecology

## 2015-07-14 ENCOUNTER — Other Ambulatory Visit: Payer: Self-pay

## 2015-07-14 ENCOUNTER — Other Ambulatory Visit: Payer: Self-pay | Admitting: Obstetrics & Gynecology

## 2015-07-14 DIAGNOSIS — N6324 Unspecified lump in the left breast, lower inner quadrant: Secondary | ICD-10-CM

## 2015-07-21 ENCOUNTER — Ambulatory Visit
Admission: RE | Admit: 2015-07-21 | Discharge: 2015-07-21 | Disposition: A | Discharge status: Home / Self Care | Payer: BLUE CROSS/BLUE SHIELD | Source: Ambulatory Visit | Attending: Obstetrics & Gynecology | Admitting: Obstetrics & Gynecology

## 2015-07-21 ENCOUNTER — Other Ambulatory Visit: Payer: Self-pay | Admitting: Obstetrics & Gynecology

## 2015-07-21 DIAGNOSIS — N6324 Unspecified lump in the left breast, lower inner quadrant: Secondary | ICD-10-CM

## 2015-07-21 DIAGNOSIS — N6313 Unspecified lump in the right breast, lower outer quadrant: Secondary | ICD-10-CM

## 2015-07-25 ENCOUNTER — Ambulatory Visit
Admission: RE | Admit: 2015-07-25 | Discharge: 2015-07-25 | Disposition: A | Discharge status: Home / Self Care | Payer: BLUE CROSS/BLUE SHIELD | Source: Ambulatory Visit | Attending: Obstetrics & Gynecology | Admitting: Obstetrics & Gynecology

## 2015-07-25 ENCOUNTER — Other Ambulatory Visit: Payer: Self-pay | Admitting: Obstetrics & Gynecology

## 2015-07-25 DIAGNOSIS — N6310 Unspecified lump in the right breast, unspecified quadrant: Secondary | ICD-10-CM

## 2015-07-26 ENCOUNTER — Ambulatory Visit
Admission: RE | Admit: 2015-07-26 | Discharge: 2015-07-26 | Disposition: A | Discharge status: Home / Self Care | Payer: BLUE CROSS/BLUE SHIELD | Source: Ambulatory Visit | Attending: Obstetrics & Gynecology | Admitting: Obstetrics & Gynecology

## 2015-07-26 DIAGNOSIS — N6310 Unspecified lump in the right breast, unspecified quadrant: Secondary | ICD-10-CM

## 2015-07-27 ENCOUNTER — Other Ambulatory Visit: Payer: Self-pay | Admitting: Obstetrics & Gynecology

## 2015-07-27 DIAGNOSIS — R87610 Atypical squamous cells of undetermined significance on cytologic smear of cervix (ASC-US): Secondary | ICD-10-CM | POA: Insufficient documentation

## 2015-07-27 DIAGNOSIS — N6310 Unspecified lump in the right breast, unspecified quadrant: Secondary | ICD-10-CM

## 2015-07-29 ENCOUNTER — Telehealth: Payer: Self-pay | Admitting: *Deleted

## 2015-07-29 ENCOUNTER — Other Ambulatory Visit: Payer: Self-pay | Admitting: Obstetrics & Gynecology

## 2015-07-29 ENCOUNTER — Encounter: Payer: Self-pay | Admitting: *Deleted

## 2015-07-29 DIAGNOSIS — C50911 Malignant neoplasm of unspecified site of right female breast: Secondary | ICD-10-CM

## 2015-07-29 DIAGNOSIS — C50511 Malignant neoplasm of lower-outer quadrant of right female breast: Secondary | ICD-10-CM

## 2015-07-29 DIAGNOSIS — C50919 Malignant neoplasm of unspecified site of unspecified female breast: Secondary | ICD-10-CM

## 2015-07-29 DIAGNOSIS — C50912 Malignant neoplasm of unspecified site of left female breast: Principal | ICD-10-CM

## 2015-07-29 HISTORY — DX: Malignant neoplasm of lower-outer quadrant of right female breast: C50.511

## 2015-07-29 NOTE — Telephone Encounter (Signed)
Mailed clinic packet to pt.  

## 2015-07-29 NOTE — Telephone Encounter (Signed)
Confirmed [facility] for 08/03/15 at 0815 .  Instructions and contact information given.

## 2015-08-01 ENCOUNTER — Other Ambulatory Visit: Payer: BLUE CROSS/BLUE SHIELD

## 2015-08-01 ENCOUNTER — Inpatient Hospital Stay: Admission: RE | Admit: 2015-08-01 | Payer: BLUE CROSS/BLUE SHIELD | Source: Ambulatory Visit

## 2015-08-02 ENCOUNTER — Ambulatory Visit
Admission: RE | Admit: 2015-08-02 | Discharge: 2015-08-02 | Disposition: A | Discharge status: Home / Self Care | Payer: BLUE CROSS/BLUE SHIELD | Source: Ambulatory Visit | Attending: Obstetrics & Gynecology | Admitting: Obstetrics & Gynecology

## 2015-08-02 DIAGNOSIS — C50911 Malignant neoplasm of unspecified site of right female breast: Secondary | ICD-10-CM

## 2015-08-02 DIAGNOSIS — C50912 Malignant neoplasm of unspecified site of left female breast: Principal | ICD-10-CM

## 2015-08-02 MED ORDER — GADOBENATE DIMEGLUMINE 529 MG/ML IV SOLN
13.0000 mL | Freq: Once | INTRAVENOUS | Status: AC | PRN
  Administered 2015-08-02: 13 mL via INTRAVENOUS

## 2015-08-03 ENCOUNTER — Encounter: Payer: Self-pay | Admitting: Hematology

## 2015-08-03 ENCOUNTER — Ambulatory Visit (HOSPITAL_BASED_OUTPATIENT_CLINIC_OR_DEPARTMENT_OTHER): Payer: BLUE CROSS/BLUE SHIELD | Admitting: Genetic Counselor

## 2015-08-03 ENCOUNTER — Encounter: Payer: Self-pay | Admitting: Genetic Counselor

## 2015-08-03 ENCOUNTER — Ambulatory Visit (HOSPITAL_BASED_OUTPATIENT_CLINIC_OR_DEPARTMENT_OTHER): Payer: BLUE CROSS/BLUE SHIELD | Admitting: Hematology

## 2015-08-03 ENCOUNTER — Encounter: Payer: Self-pay | Admitting: Skilled Nursing Facility1

## 2015-08-03 ENCOUNTER — Telehealth: Payer: Self-pay | Admitting: Hematology

## 2015-08-03 ENCOUNTER — Ambulatory Visit (HOSPITAL_BASED_OUTPATIENT_CLINIC_OR_DEPARTMENT_OTHER): Payer: BLUE CROSS/BLUE SHIELD

## 2015-08-03 ENCOUNTER — Ambulatory Visit
Admission: RE | Admit: 2015-08-03 | Discharge: 2015-08-03 | Disposition: A | Discharge status: Home / Self Care | Payer: BLUE CROSS/BLUE SHIELD | Source: Ambulatory Visit | Attending: Radiation Oncology | Admitting: Radiation Oncology

## 2015-08-03 ENCOUNTER — Other Ambulatory Visit (HOSPITAL_BASED_OUTPATIENT_CLINIC_OR_DEPARTMENT_OTHER): Payer: BLUE CROSS/BLUE SHIELD

## 2015-08-03 VITALS — BP 172/97 | HR 76 | Temp 98.1°F | Resp 18 | Ht 64.0 in | Wt 145.3 lb

## 2015-08-03 DIAGNOSIS — C50511 Malignant neoplasm of lower-outer quadrant of right female breast: Secondary | ICD-10-CM

## 2015-08-03 DIAGNOSIS — D500 Iron deficiency anemia secondary to blood loss (chronic): Secondary | ICD-10-CM

## 2015-08-03 DIAGNOSIS — C50919 Malignant neoplasm of unspecified site of unspecified female breast: Secondary | ICD-10-CM

## 2015-08-03 DIAGNOSIS — Z170 Estrogen receptor positive status [ER+]: Secondary | ICD-10-CM | POA: Diagnosis not present

## 2015-08-03 DIAGNOSIS — D509 Iron deficiency anemia, unspecified: Secondary | ICD-10-CM

## 2015-08-03 DIAGNOSIS — Z800 Family history of malignant neoplasm of digestive organs: Secondary | ICD-10-CM | POA: Insufficient documentation

## 2015-08-03 DIAGNOSIS — Z803 Family history of malignant neoplasm of breast: Secondary | ICD-10-CM

## 2015-08-03 DIAGNOSIS — Z8042 Family history of malignant neoplasm of prostate: Secondary | ICD-10-CM

## 2015-08-03 LAB — COMPREHENSIVE METABOLIC PANEL
ALT: 10 U/L (ref 0–55)
AST: 19 U/L (ref 5–34)
Albumin: 4 g/dL (ref 3.5–5.0)
Alkaline Phosphatase: 53 U/L (ref 40–150)
Anion Gap: 8 mEq/L (ref 3–11)
BUN: 10.9 mg/dL (ref 7.0–26.0)
CO2: 25 mEq/L (ref 22–29)
Calcium: 9.3 mg/dL (ref 8.4–10.4)
Chloride: 109 mEq/L (ref 98–109)
Creatinine: 0.8 mg/dL (ref 0.6–1.1)
EGFR: >90 mL/min/{1.73_m2} (ref >90)
Glucose: 100 mg/dl (ref 70–140)
Potassium: 4 mEq/L (ref 3.5–5.1)
Sodium: 143 mEq/L (ref 136–145)
Total Bilirubin: 0.36 mg/dL (ref 0.20–1.20)
Total Protein: 7.6 g/dL (ref 6.4–8.3)

## 2015-08-03 LAB — CBC WITH DIFFERENTIAL/PLATELET
BASO%: 0.9 % (ref 0.0–2.0)
Basophils Absolute: 0.1 10*3/uL (ref 0.0–0.1)
EOS%: 1.6 % (ref 0.0–7.0)
Eosinophils Absolute: 0.1 10*3/uL (ref 0.0–0.5)
HCT: 27.6 % — ABNORMAL LOW (ref 34.8–46.6)
HGB: 7.8 g/dL — ABNORMAL LOW (ref 11.6–15.9)
LYMPH%: 42.1 % (ref 14.0–49.7)
MCH: 17.7 pg — ABNORMAL LOW (ref 25.1–34.0)
MCHC: 28.2 g/dL — ABNORMAL LOW (ref 31.5–36.0)
MCV: 62.7 fL — ABNORMAL LOW (ref 79.5–101.0)
MONO#: 0.5 10*3/uL (ref 0.1–0.9)
MONO%: 7.8 % (ref 0.0–14.0)
NEUT#: 3.3 10*3/uL (ref 1.5–6.5)
NEUT%: 47.6 % (ref 38.4–76.8)
Platelets: 508 10*3/uL — ABNORMAL HIGH (ref 145–400)
RBC: 4.4 10*6/uL (ref 3.70–5.45)
RDW: 30.4 % — ABNORMAL HIGH (ref 11.2–14.5)
WBC: 7 10*3/uL (ref 3.9–10.3)
lymph#: 2.9 10*3/uL (ref 0.9–3.3)

## 2015-08-03 LAB — IRON AND TIBC
%SAT: 3 % — ABNORMAL LOW (ref 21–57)
Iron: 11 ug/dL — ABNORMAL LOW (ref 41–142)
TIBC: 380 ug/dL (ref 236–444)
UIBC: 369 ug/dL (ref 120–384)

## 2015-08-03 LAB — FERRITIN: Ferritin: 25 ng/ml (ref 9–269)

## 2015-08-03 NOTE — Telephone Encounter (Signed)
per pof to sch pt appt-sent MW email to sch trmt-willc all pt after email

## 2015-08-03 NOTE — Progress Notes (Signed)
REFERRING PROVIDER: Lucianne Lei, MD [place], [place] [address]   Truitt Merle, MD  PRIMARY PROVIDER:  Elyn Peers, MD  PRIMARY REASON FOR VISIT:  1. Breast cancer of lower-outer quadrant of right female breast ([place])   2. Family history of breast cancer   3. Family history of colon cancer   4. Family history of prostate cancer      HISTORY OF PRESENT ILLNESS:   Melanie Moss, a 40 y.o. female, was seen for a [place] cancer genetics consultation at the request of Dr. Burr Medico due to a personal and family history of cancer.  Melanie Moss presents to clinic today to discuss the possibility of a hereditary predisposition to cancer, genetic testing, and to further clarify her future cancer risks, as well as potential cancer risks for family members.   In 2017, at the age of 34, Melanie Moss was diagnosed with two primary cancers of the right breast. 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 This will be treated with another biopsy, before determining surgery and the need for chemotherapy.    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 breast ([place])   07/25/2015 Mammogram diagnostic mammogram and ultrasound showed a 11 mm mass in the 8:00 position of the right breast, somewhat irregular lesion in the [facility] region of the right breast, contiguous with the dermis   07/26/2015 Initial Diagnosis Breast cancer of lower-outer quadrant of right female breast ([place])   07/26/2015 Initial Biopsy right breast needle core biopsy at 8:00 position (1), invasive ductal carcinoma, grade 3, 6:30 o'clock position invasive (2) and in situ ductal carcinoma, grade 1-2, lymphovascular invasion is identified   07/26/2015 Receptors her2 (1) ER 95% positive, PR 95% positive, Ki-67 30%, HER-2 positive with ratio 2.04, copy # 5.20, (2) ER 90% positive, PR 95% positive, Ki-67 10%, HER-2 negative.   08/02/2015 Imaging breast MRI showed extensive mass  and no mass enhancement in  the lower outer quadrant of the right breast  spanning 13 cm, focal enhancement within the right nipple, at least 3-4 abnormal appearing right axillary lymph node.     HORMONAL RISK FACTORS:  Menarche was at age 12.  First live birth at age 34.  OCP use for approximately 5-6 years.  Ovaries intact: yes.  Hysterectomy: no.  Menopausal status: premenopausal.  HRT use: 0 years. Colonoscopy: no; not examined. Mammogram within the last year: yes. Number of breast biopsies: 1. Up to date with pelvic exams:  yes. Any excessive radiation exposure in the past:  no  Past Medical History  Diagnosis Date  . Hypertension   . Preterm labor   . GERD (gastroesophageal reflux disease)   . Anemia   . Hyperlipidemia   . Gestational diabetes   . Breast cancer of lower-outer quadrant of right female breast ([place]) 07/29/2015  . Family history of breast cancer   . Family history of colon cancer   . Family history of prostate cancer     Past Surgical History  Procedure Laterality Date  . Dilation and curettage of uterus    . Cervical cerclage  02/19/2011    Procedure: CERCLAGE CERVICAL;  Surgeon: Agnes Lawrence, MD;  Location: [facility];  Service: Gynecology;  Laterality: N/A;    Social History   Social History  . Marital Status: Single    Spouse Name: N/A  . Number of Children: 1  . Years of Education: N/A  Social History Main Topics  . Smoking status: Never Smoker   . Smokeless tobacco: Never Used  . Alcohol Use: No  . Drug Use: No  . Sexual Activity: Yes   Other Topics Concern  . None   Social History Narrative     FAMILY HISTORY:  We obtained a detailed, 4-generation family history.  Significant diagnoses are listed below: Family History  Problem Relation Age of Onset  . Hypertension Mother   . Hypertension Father   . Prostate cancer Father 18  . Breast cancer Maternal Aunt 17  . Breast cancer Paternal Aunt 32  . Cirrhosis Maternal Uncle   . Prostate cancer Paternal  Uncle   . Throat cancer Maternal Aunt     non smoker  . Prostate cancer Paternal Uncle   . Colon cancer Cousin     maternal first cousin dx in his 68s  . Lung cancer Cousin     smoker    The patient has one son who is healthy and cancer free.  She has two full sisters and a maternal half sister who are all cancer free.  Both parents are deceased.  Her mother died at 42 with heart disease and her father died from prostate cancer.  Her father had 12 brothers and two sisters.  Two brothers were diagnosed with prostate cancer around age 79 and one sister was diagnosed with breast cancer in her 64s. Her mother had 88 sisters and two brothers.  One sister ahd breast cancer in her 18s-50s.  SHe had two sons one who had colon cancer and the other lung cancer.  Another of the patient's mother's sisters had throat cancer and was a non smoker. There is no other reported cancer history  Patient's maternal ancestors are of African American descent, and paternal ancestors are of African American descent. There is no reported Ashkenazi Jewish ancestry. There is no known consanguinity.  GENETIC COUNSELING ASSESSMENT: Melanie Moss is a 40 y.o. female with a personal and family history of cancer which is somewhat suggestive of a hereditary cancer syndrome and predisposition to cancer. We, therefore, discussed and recommended the following at today's visit.   DISCUSSION: We discussed that about 5-10% of breast cancer is hereditary with most cases due to BRCA mutations.  Other causes of hereditary breast cancer include changes in the PALB2, ATM and CHEK2 genes.  We reviewed the characteristics, features and inheritance patterns of hereditary cancer syndromes. We also discussed genetic testing, including the appropriate family members to test, the process of testing, insurance coverage and turn-around-time for results. We discussed the implications of a negative, positive and/or variant of uncertain significant result.  We recommended Melanie Moss pursue genetic testing for the Breast/Ovarian cancer gene panel. The Breast/Ovarian gene panel offered by [facility] includes sequencing and rearrangement analysis for the following 20 genes:  ATM, BARD1, BRCA1, BRCA2, BRIP1, CDH1, CHEK2, EPCAM, FANCC, MLH1, MSH2, MSH6, NBN, PALB2, PMS2, PTEN, RAD51C, RAD51D, TP53, and XRCC2.     Based on Melanie Moss's personal and family history of cancer, she meets medical criteria for genetic testing. Despite that she meets criteria, she may still have an out of pocket cost. We discussed that if her out of pocket cost for testing is over $100, the laboratory will call and confirm whether she wants to proceed with testing.  If the out of pocket cost of testing is less than $100 she will be billed by the genetic testing laboratory.   PLAN: After considering the risks, benefits,  and limitations, Melanie Moss  provided informed consent to pursue genetic testing and the blood sample was sent to [facility] for analysis of the Breast/Ovarian cancer genes. We have placed a RUSH on the results based on trying to schedule surgery, therefore, results should be available within approximately 2 weeks' time, at which point they will be disclosed by telephone to Melanie Moss, as will any additional recommendations warranted by these results. Melanie Moss will receive a summary of her genetic counseling visit and a copy of her results once available. This information will also be available in Epic. We encouraged Melanie Moss to remain in contact with cancer genetics annually so that we can continuously update the family history and inform her of any changes in cancer genetics and testing that may be of benefit for her family. Melanie Moss questions were answered to her satisfaction today. Our contact information was provided should additional questions or concerns arise.  Lastly, we encouraged Melanie Moss to remain in contact with cancer genetics annually so that we can continuously  update the family history and inform her of any changes in cancer genetics and testing that may be of benefit for this family.   Ms.  Moss questions were answered to her satisfaction today. Our contact information was provided should additional questions or concerns arise. Thank you for the referral and allowing [place] to share in the care of your patient.   Esequiel Kleinfelter P. Florene Glen, [place], [facility] Certified Genetic Counselor [email or web address] phone: [phone]  The patient was seen for a total of 45 minutes in face-to-face genetic counseling.  This patient was discussed with Drs. Magrinat, Lindi Adie and/or Burr Medico who agrees with the above.    _______________________________________________________________________ For Office Staff:  Number of people involved in session: 5 Was an Intern/ student involved with case: yes

## 2015-08-03 NOTE — Progress Notes (Signed)
Subjective:     Patient ID: Melanie Moss, female   DOB: 09-Jan-1976, 40 y.o.   MRN: [id number]  HPI   Review of Systems     Objective:   Physical Exam For the patient to understand and be given the tools to implement a healthy plant based diet during their cancer diagnosis.     Assessment:     Patient was seen today and found to be tearful and surrounded by several family members. Pts labs identify anemia. Pts medications: ferrous sulfate. Pts ht 5'4'', 145 pounds, and BMI 25. Pts family members questioned her anemia. Pt states she does not like beans.      Plan:     Dietitian educated the patient on implementing a plant based diet by incorporating more plant proteins, fruits, and vegetables. As a part of a healthy routine physical activity was discussed. Dietitian educated the pt on dietary strategies to increase the absorption of her iron.  The importance of legitimate, evidence based information was discussed and examples were given. A folder of evidence based information with a focus on a plant based diet and general nutrition during cancer was given to the patient.  As a part of the continuum of care the cancer dietitian's contact information was given to the patient in the event they would like to have a follow up appointment.

## 2015-08-03 NOTE — Progress Notes (Unsigned)
Subjective:     Patient ID: Melanie Moss, female   DOB: [birth year], 40 y.o.   MRN: [id number]  HPI   Review of Systems     Objective:   Physical Exam For the patient to understand and be given the tools to implement a healthy plant based diet during their cancer diagnosis.     Assessment:     Patient was seen today and found to be.... Pts labs: HGB 7.8, HCT 27.6, Plat[place] 508, MCV 62.7, MCH 17.7. Pts ht 5'4'', 145 pounds, BMI 25.      Plan:     Dietitian educated the patient on implementing a plant based diet by incorporating more plant proteins, fruits, and vegetables. As a part of a healthy routine physical activity was discussed. A folder of evidence based information with a focus on a plant based diet and general nutrition during cancer was given to the patient.  The importance of legitimate, evidence based information was discussed and examples were given. As a part of the continuum of care the cancer dietitian's contact information was given to the patient in the event they would like to have a follow up appointment.

## 2015-08-03 NOTE — Progress Notes (Signed)
[place]  [phone] Fax:([phone]) [place] Note   Patient Care Team: Lucianne Lei, MD as PCP - General (Family Medicine) 08/03/2015  CHIEF COMPLAINTS/PURPOSE OF CONSULTATION:  Newly diagnosed right breast cancer    Breast cancer of lower-outer quadrant of right female breast ([place])   07/25/2015 Mammogram diagnostic mammogram and ultrasound showed a 11 mm mass in the 8:00 position of the right breast, small irregular lesion in the retroareolar regio of the right breast, contiguous with the dermis   07/26/2015 Initial Diagnosis Breast cancer of lower-outer quadrant of right female breast ([place])   07/26/2015 Initial Biopsy right breast needle core biopsy at 8:00 position (1), invasive ductal carcinoma, grade 3, 6:30 o'clock position invasive (2) and in situ ductal carcinoma, grade 1-2, lymphovascular invasion is identified   07/26/2015 Receptors her2 (1) ER 95% positive, PR 95% positive, Ki-67 30%, HER-2 positive with ratio 2.04, copy # 5.20, (2) ER 90% positive, PR 95% positive, Ki-67 10%, HER-2 negative.   08/02/2015 Imaging breast MRI showed extensive mass  and non-mass enhancement in the lower outer quadrant of the right breast  spanning 13 cm, focal enhancement within the right nipple, at least 3-4 abnormal appearing right axillary lymph node.    HISTORY OF PRESENTING ILLNESS:  Melanie Moss 40 y.o. female is here because of Her newly diagnosed right breast cancer. She is accompanied by her fianc, sister and mother to our multidisciplinary breast clinic today.  She felt a breast lump and noticed mild intermittent breast pain 2 weeks ago, no nipple discharge or skin change. She also has moderate fatigue and dyspnea on moderate to heavy exertion for a few months, no weight loss, no other pain or other symptoms.  She had normal mammgram 6 years ago, for a small lump which felt to be benign and no biopsy was done.    She has iregular period, twice a  month, it lasts about 5-7 days, heavy for 3 days, she chanes 6-7 times a day. She was found to be anemic recently, and has started taking oral iron pill a few weeks ago, tolerates well.  GYN HISTORY  Menarchal: 10  LMP: 07/29/2015 Contraceptive: no  HRT: n/a  G4P1: 3 miscarrage, one 62 yo son, no plan to have more children    MEDICAL HISTORY:  Past Medical History  Diagnosis Date  . Hypertension   . Preterm labor   . GERD (gastroesophageal reflux disease)   . Anemia   . Hyperlipidemia   . Gestational diabetes   . Breast cancer of lower-outer quadrant of right female breast ([place]) 07/29/2015  . Family history of breast cancer   . Family history of colon cancer   . Family history of prostate cancer     SURGICAL HISTORY: Past Surgical History  Procedure Laterality Date  . Dilation and curettage of uterus    . Cervical cerclage  02/19/2011    Procedure: CERCLAGE CERVICAL;  Surgeon: Agnes Lawrence, MD;  Location: [facility];  Service: Gynecology;  Laterality: N/A;    SOCIAL HISTORY: Social History   Social History  . Marital Status: Single    Spouse Name: N/A  . Number of Children: 1  . Years of Education: N/A   Occupational History  . Not on file.   Social History Main Topics  . Smoking status: Never Smoker   . Smokeless tobacco: Never Used  . Alcohol Use: No  . Drug Use: No  . Sexual Activity: Yes  Other Topics Concern  . Not on file   Social History Narrative    FAMILY HISTORY: Family History  Problem Relation Age of Onset  . Hypertension Mother   . Hypertension Father   . Prostate cancer Father 73  . Breast cancer Maternal Aunt 40  . Breast cancer Paternal Aunt 14  . Cirrhosis Maternal Uncle   . Prostate cancer Paternal Uncle   . Throat cancer Maternal Aunt     non smoker  . Prostate cancer Paternal Uncle   . Colon cancer Cousin     maternal first cousin dx in his 61s  . Lung cancer Cousin     smoker    ALLERGIES:  has No Known  Allergies.  MEDICATIONS:  Current Outpatient Prescriptions  Medication Sig Dispense Refill  . ferrous sulfate 325 (65 FE) MG tablet Take 325 mg by mouth 2 (two) times daily with a meal.     No current facility-administered medications for this visit.    REVIEW OF SYSTEMS:   Constitutional: Denies fevers, chills or abnormal night sweats Eyes: Denies blurriness of vision, double vision or watery eyes Ears, nose, mouth, throat, and face: Denies mucositis or sore throat Respiratory: Denies cough, dyspnea or wheezes Cardiovascular: Denies palpitation, chest discomfort or lower extremity swelling Gastrointestinal:  Denies nausea, heartburn or change in bowel habits Skin: Denies abnormal skin rashes Lymphatics: Denies new lymphadenopathy or easy bruising Neurological:Denies numbness, tingling or new weaknesses Behavioral/Psych: Mood is stable, no new changes  All other systems were reviewed with the patient and are negative.  PHYSICAL EXAMINATION: ECOG PERFORMANCE STATUS: 1 - Symptomatic but completely ambulatory  Filed Vitals:   08/03/15 0837  BP: 172/97  Pulse: 76  Temp: 98.1 F (36.7 C)  Resp: 18   Filed Weights   08/03/15 0837  Weight: 145 lb 4.8 oz (65.908 kg)    GENERAL:alert, no distress and comfortable SKIN: skin color, texture, turgor are normal, no rashes or significant lesions EYES: normal, conjunctiva are pink and non-injected, sclera clear OROPHARYNX:no exudate, no erythema and lips, buccal mucosa, and tongue normal  NECK: supple, thyroid normal size, non-tender, without nodularity LYMPH:  no palpable lymphadenopathy in the cervical, axillary or inguinal LUNGS: clear to auscultation and percussion with normal breathing effort HEART: regular rate & rhythm and no murmurs and no lower extremity edema ABDOMEN:abdomen soft, non-tender and normal bowel sounds Musculoskeletal:no cyanosis of digits and no clubbing  PSYCH: alert & oriented x 3 with fluent speech NEURO:  no focal motor/sensory deficits Breasts: Breast inspection showed them to be symmetrical with no skin change or nipple discharge. There is a 2.0X1.0cm mass at 8:00 position and a 3.5X1.5cm mass retroareolar 6:00pm position of right breast, and nodularity inbetween this two masses. There is a small lump in the tail of the RUQ right breast which is likely a lymph node. Palpation of the left breast and axilla revealed no obvious mass that I could appreciate.   LABORATORY DATA:  I have reviewed the data as listed Lab Results  Component Value Date   WBC 7.0 08/03/2015   HGB 7.8* 08/03/2015   HCT 27.6* 08/03/2015   MCV 62.7* 08/03/2015   PLT 508* 08/03/2015    Recent Labs  08/03/15 0816  NA 143  K 4.0  CO2 25  GLUCOSE 100  BUN 10.9  CREATININE 0.8  CALCIUM 9.3  PROT 7.6  ALBUMIN 4.0  AST 19  ALT 10  ALKPHOS 53  BILITOT 0.36    PATHOLOGY: Diagnosis 07/26/2015 1. Breast,  right, needle core biopsy, 8:00 o'clock, 12 CMFN - INVASIVE DUCTAL CARCINOMA. - SEE COMMENT. 2. Breast, right, needle core biopsy, 6:30 o'clock,1 CMFN - INVASIVE AND IN SITU MAMMARY CARCINOMA. - LYMPHOVASCULAR INVASION IS IDENTIFIED. - SEE COMMENT. Microscopic Comment 1. The carcinoma appears grade 3. A breast prognostic profile will be performed and the results reported separately. The results were called to [facility] on 07/27/15. 2. The carcinoma is morphologically dissimilar from that seen in part 1 and appears grade 1-2. An E-cadherin stain will be performed on part 2 and the results reported separately. In addition, a breast prognostic profile will be performed on part 2 and the results reported separately. (JBK:gt, 07/27/15)  1. PROGNOSTIC INDICATORS Results: IMMUNOHISTOCHEMICAL AND MORPHOMETRIC ANALYSIS PERFORMED MANUALLY Estrogen Receptor: 95%, POSITIVE, STRONG STAINING INTENSITY Progesterone Receptor: 95%, POSITIVE, STRONG STAINING INTENSITY Proliferation Marker Ki67:  30% Results: HER2 - **POSITIVE** RATIO OF HER2/CEP17 SIGNALS 2.04 AVERAGE HER2 COPY NUMBER PER CELL 5.20  Results: 2. PROGNOSTIC INDICATORS HER2 - NEGATIVE RATIO OF HER2/CEP17 SIGNALS 1.39 AVERAGE HER2 COPY NUMBER PER CELL 2.50 Results: IMMUNOHISTOCHEMICAL AND MORPHOMETRIC ANALYSIS PERFORMED MANUALLY Estrogen Receptor: 90%, POSITIVE, STRONG STAINING INTENSITY Progesterone Receptor: 95%, POSITIVE, STRONG STAINING INTENSITY Proliferation Marker Ki67: 10%  RADIOGRAPHIC STUDIES: I have personally reviewed the radiological images as listed and agreed with the findings in the report.  Mr Breast Bilateral W Wo Contrast 08/02/2015   FINDINGS: Breast composition: c. Heterogeneous fibroglandular tissue. Background parenchymal enhancement: Mild. Right breast: Susceptibility artifact is seen within an enhancing mass in the lower outer quadrant of the right breast, posterior depth denoting the site of known grade 3 invasive ductal carcinoma (series 7, image 111). The heterogeneously enhancing mass measures 1.6 x 1.3 x 1.3 cm. 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 There is extensive clumped non mass enhancement involving much of the lower outer quadrant of the right breast, extending at least 13 cm in the anterior to posterior dimension between an inferior to the biopsied masses. There is suspicious washout kinetics throughout this area of enhancement. Two morphologic areas of distortion are seen in the lower outer quadrant in the anterior depth within this area of enhancement, likely corresponding with the questioned distortion seen mammographically (series 3, images 105 and 110). A suspicious area of enhancement is seen within the nipple (series 7, image 86). There a small suspicious  enhancing mass in the right breast, in the middle depth (series 7, image 86) measuring 7 mm, demonstrating washout kinetics. Left breast: No mass or abnormal enhancement. Lymph nodes: There are at least 3-4 asymmetrically enlarged right axillary level 1 lymph nodes. No evidence of left axillary lymphadenopathy. No internal mammary lymphadenopathy is identified. Ancillary findings: Several T2 bright foci are seen within the liver, most likely representing hepatic cysts. IMPRESSION: 1. Extensive mass and non mass enhancement in the lower outer quadrant of the right breast spanning at least 13 cm in the anterior to posterior dimension. 2. There is focal enhancement within the right nipple, suspicious for nipple involvement of disease. 3. There are at least 3-4 abnormal appearing right axillary lymph nodes. 4.  No MRI evidence of malignancy in the left breast. RECOMMENDATION: 1. Second-look ultrasound of the right axilla is recommended to evaluate for lymphadenopathy. At least 1 abnormal lymph node should be considered  for biopsy. 2. Additional sites of biopsy in the right breast may be performed if this would at all change management. If biopsy is indicated, then we could target the area of distortion by stereotactic biopsy, or perform an MRI biopsy of the suspicious enhancing mass in the central right breast. BI-RADS CATEGORY  6: Known biopsy-proven malignancy. Electronically Signed   By: Frederico Hamman M.D.   On: 08/02/2015 14:31   Mm Digital Diagnostic Unilat R  07/26/2015  CLINICAL DATA:  Two palpable masses were biopsied in the right breast today. Including a palpable mass in the 8 o'clock position approximately 12 cm from the nipple and a second palpable mass at 6:30 position 1 cm from the nipple. EXAM: DIAGNOSTIC RIGHT MAMMOGRAM POST ULTRASOUND BIOPSY COMPARISON:  07/25/2015 and 06/2020 2017 FINDINGS: Mammographic images were obtained following ultrasound guided biopsy of 2 masses in the right breast,  including 8 o'clock position 12 cm from the nipple and 6:30 position 1 cm from the nipple. A ribbon shaped biopsy clip is satisfactorily positioned within the discrete oval mass in the far posterior 8 o'clock position of the right breast. A heart shaped biopsy clip is satisfactorily positioned in the 630 region of the right breast, periareolar. These 2 clips are separated by approximately 11 cm. There is an area of subtle distortion in the lower outer quadrant of the right breast, confirmed on my review of recent 3D tomographic images of the right breast. This distortion is in the anterior to middle thirds of the lower outer quadrant, and felt to possibly correspond to the palpable irregular suspicious mass seen on today's ultrasound, while planning for the biopsies. On ultrasound, this mass was 8 o'clock position 8 cm from the nipple, and has not been biopsied. IMPRESSION: Satisfactory position of 2 biopsy marker clips in the right breast as described above. These 2 clips are separated by 11 cm. A third suspicious area was detected on evaluation today in the 8 o'clock region of the right breast approximately 8 cm from the nipple. Once the pathology results are available for the 2 biopsies performed today, a third biopsy could be considered, if felt to affect patient management. Final Assessment: Post Procedure Mammograms for Marker Placement Electronically Signed   By: Britta Mccreedy M.D.   On: 07/26/2015 14:00   US Breast Ltd Uni Right Inc Axilla  07/25/2015  CLINICAL DATA:  Screening recall for a possible mass in the right breast. Patient also reports a palpable small lump along inferior periareolar region on the right. EXAM: 2D DIGITAL DIAGNOSTIC RIGHT MAMMOGRAM WITH CAD AND ADJUNCT TOMO ULTRASOUND RIGHT BREAST COMPARISON:  Previous exam(s). ACR Breast Density Category c: The breast tissue is heterogeneously dense, which may obscure small masses. FINDINGS: The mass, which lies in the posterior lower outer  quadrant of right breast, persists on the diagnostic 2D and 3D images. It is oval and circumscribed. No mass is appreciated in the anterior periareolar/ retroareolar region. There are no areas of architectural distortion. Mammographic images were processed with CAD. On physical exam, the small palpable superficial nodule just below the nipple along the air areolar margin. This may reside in the skin. There is a small mobile palpable mass at in the lower outer quadrant of right breast in the expected location of the mammographic abnormality. Targeted ultrasound is performed, showing a hypoechoic circumscribed mass with mildly lobulated margins, in the 8 o'clock position of the right breast, 10 cm the nipple, measuring 11 x 11 x 11 mm. Mass shows  internal blood flow on color Doppler analysis. In the retroareolar region, at the 6:30 o'clock position, there is an irregular hypoechoic lesions that is contiguous with the dermis. It measures 8 x 5 x 8 mm. Margins are irregular. IMPRESSION: 1. New mass in the 8 o'clock position of the right breast measuring 11 mm in size. Biopsy is warranted. 2. Small irregular lesion in the anterior, retroareolar region of the right breast, contiguous with the dermis. This is likely skin inclusion cyst or sebaceous cyst, but could potentially reflect a superficial breast carcinoma. Biopsy is recommended. RECOMMENDATION: Ultrasound-guided core needle biopsy of both the 8 o'clock position right breast mass and the smaller superficial lesion in the inferior retroareolar region. I have discussed the findings and recommendations with the patient. Results were also provided in writing at the conclusion of the visit. If applicable, a reminder letter will be sent to the patient regarding the next appointment. BI-RADS CATEGORY  4: Suspicious. Electronically Signed   By: Amie Portland M.D.   On: 07/25/2015 13:59   Mm Diag Breast Tomo Uni Right  07/25/2015  CLINICAL DATA:  Screening recall for a  possible mass in the right breast. Patient also reports a palpable small lump along inferior periareolar region on the right. EXAM: 2D DIGITAL DIAGNOSTIC RIGHT MAMMOGRAM WITH CAD AND ADJUNCT TOMO ULTRASOUND RIGHT BREAST COMPARISON:  Previous exam(s). ACR Breast Density Category c: The breast tissue is heterogeneously dense, which may obscure small masses. FINDINGS: The mass, which lies in the posterior lower outer quadrant of right breast, persists on the diagnostic 2D and 3D images. It is oval and circumscribed. No mass is appreciated in the anterior periareolar/ retroareolar region. There are no areas of architectural distortion. Mammographic images were processed with CAD. On physical exam, the small palpable superficial nodule just below the nipple along the air areolar margin. This may reside in the skin. There is a small mobile palpable mass at in the lower outer quadrant of right breast in the expected location of the mammographic abnormality. Targeted ultrasound is performed, showing a hypoechoic circumscribed mass with mildly lobulated margins, in the 8 o'clock position of the right breast, 10 cm the nipple, measuring 11 x 11 x 11 mm. Mass shows internal blood flow on color Doppler analysis. In the retroareolar region, at the 6:30 o'clock position, there is an irregular hypoechoic lesions that is contiguous with the dermis. It measures 8 x 5 x 8 mm. Margins are irregular. IMPRESSION: 1. New mass in the 8 o'clock position of the right breast measuring 11 mm in size. Biopsy is warranted. 2. Small irregular lesion in the anterior, retroareolar region of the right breast, contiguous with the dermis. This is likely skin inclusion cyst or sebaceous cyst, but could potentially reflect a superficial breast carcinoma. Biopsy is recommended. RECOMMENDATION: Ultrasound-guided core needle biopsy of both the 8 o'clock position right breast mass and the smaller superficial lesion in the inferior retroareolar region. I  have discussed the findings and recommendations with the patient. Results were also provided in writing at the conclusion of the visit. If applicable, a reminder letter will be sent to the patient regarding the next appointment. BI-RADS CATEGORY  4: Suspicious. Electronically Signed   By: Amie Portland M.D.   On: 07/25/2015 13:59   [place] Rt Breast Bx W Loc Dev 1st Lesion Img Bx Spec US Guide  07/28/2015  ADDENDUM REPORT: 07/27/2015 11:50 ADDENDUM: Pathology revealed grade III invasive ductal carcinoma in the right breast at 8:00. The right breast  at 6:30 showed grade I to II invasive mammary carcinoma and mammary carcinoma in situ, and this is morphologically dissimilar from the first biopsy . This was found to be concordant by Dr. Curlene Dolphin. Pathology results were discussed with the patient by telephone. The patient reported doing well after the biopsy. Post biopsy instructions and care were reviewed and questions were answered. The patient was encouraged to call [facility] for any additional concerns. A breast MRI is being scheduled for the patient. The patient is scheduled for an additional right breast biopsy of a mass in the right breast at 8:00, 8 cm from the nipple, on Monday, August 01, 2015. At that time, she also needs the right axillary lymph nodes scanned. The patient was referred to the [facility] at the [facility] on August 03, 2015. Pathology results reported by Susa Raring RN, BSN on 07/27/2015. Electronically Signed   By: Curlene Dolphin M.D.   On: 07/27/2015 11:50  07/28/2015  CLINICAL DATA:  40 year old patient with suspicious mass 8 o'clock right breast detected on recent screening mammogram dated 07/13/2015. On the diagnostic evaluation, this was shown to be an irregular solid mass at 8 o'clock position right breast at least 10 cm from the nipple, measuring 1.1 cm greatest diameter. Additionally, the patient complained  of a lump in the periareolar right breast at 6:30 position 1 cm from the nipple and an irregular superficially positioned mass measuring approximately 8 mm greatest diameter was described and biopsy was recommended. This report describes biopsy of the dominant mass at 8 o'clock position right breast, 10-12 cm from the nipple. EXAM: ULTRASOUND GUIDED RIGHT BREAST CORE NEEDLE BIOPSY COMPARISON:  Previous exam(s). FINDINGS: I met with the patient and we discussed the procedure of ultrasound-guided biopsy, including benefits and alternatives. We discussed the high likelihood of a successful procedure. We discussed the risks of the procedure, including infection, bleeding, tissue injury, clip migration, and inadequate sampling. Informed written consent was given. The usual time-out protocol was performed immediately prior to the procedure. When preparing for the examination and attempting to palpate the patient's suspicious mass in the 8 o'clock position of the right breast 10-12 cm from the nipple, I also palpated a separate mass at approximately 8 o'clock position 8 cm from the nipple. Ultrasound of this region shows a markedly irregular hypoechoic taller than wide mass measuring approximately 1.4 x 1.1 x 0.6 cm mass at 8 o'clock position 8 cm from the nipple. This was not described on the previous ultrasound. Additionally, the discretely palpable mass in the far lateral right breast 8 o'clock position approximately 12 cm from the nipple is palpated, and corresponds to the mass described on the ultrasound of 07/25/2015, for which ultrasound-guided biopsy was recommended. Using sterile technique and 1% Lidocaine as local anesthetic, under direct ultrasound visualization, a 14 gauge spring-loaded device was used to perform biopsy of a 1.1 cm regular mass at 8 o'clock position approximately 12 cm from nipple using a lateral to medial approach. At the conclusion of the procedure a ribbon shaped tissue marker clip was  deployed into the biopsy cavity. Follow up 2 view mammogram was performed and dictated separately. IMPRESSION: Ultrasound guided biopsy of right breast mass at 8 o'clock position 10-12 cm from the nipple. No apparent complications. Additionally, in preparing for the examination I palpated a separate mass in the 8 o'clock region, closer to the nipple, at approximately 8 cm from the nipple that is  irregular and hypoechoic and measures 1.1 x 1.4 x 0.6 cm. This mass was not biopsied today. Electronically Signed: By: Curlene Dolphin M.D. On: 07/26/2015 13:47   [place] Rt Breast Bx W Loc Dev Ea Add Lesion Img Bx Spec US Guide  07/28/2015  ADDENDUM REPORT: 07/27/2015 11:51 ADDENDUM: Pathology revealed grade III invasive ductal carcinoma in the right breast at 8:00. The right breast at 6:30 showed grade I to II invasive mammary carcinoma and mammary carcinoma in situ, and this is morphologically dissimilar from the first biopsy . This was found to be concordant by Dr. Curlene Dolphin. Pathology results were discussed with the patient by telephone. The patient reported doing well after the biopsy. Post biopsy instructions and care were reviewed and questions were answered. The patient was encouraged to call [facility] for any additional concerns. A breast MRI is being scheduled for the patient. The patient is scheduled for an additional right breast biopsy of a mass in the right breast at 8:00, 8 cm from the nipple, on Monday, August 01, 2015. At that time, she also needs the right axillary lymph nodes scanned. The patient was referred to the [facility] at the [facility] on August 03, 2015. Pathology results reported by Susa Raring RN, BSN on 07/27/2015. Electronically Signed   By: Curlene Dolphin M.D.   On: 07/27/2015 11:51  07/28/2015  CLINICAL DATA:  Ultrasound-guided biopsy was recommended of a palpable mass in the 6:30 position of the right breast, 1 cm  from the nipple. Please note that the patient had a second biopsy performed of a separate palpable suspicious mass in the 8 o'clock position the right breast today, dictated separately. EXAM: ULTRASOUND GUIDED RIGHT BREAST CORE NEEDLE BIOPSY COMPARISON:  Previous exam(s). FINDINGS: I met with the patient and we discussed the procedure of ultrasound-guided biopsy, including benefits and alternatives. We discussed the high likelihood of a successful procedure. We discussed the risks of the procedure, including infection, bleeding, tissue injury, clip migration, and inadequate sampling. Informed written consent was given. The usual time-out protocol was performed immediately prior to the procedure. Using sterile technique and 1% Lidocaine as local anesthetic, under direct ultrasound visualization, a 14 gauge spring-loaded device was used to perform biopsy of a palpable irregular mass in the superficial aspect of the breast parenchyma 6:30 position 1 cm from the nipple using a lateral to me approach. At the conclusion of the procedure a heart shaped tissue marker clip was deployed into the biopsy cavity. Follow up 2 view mammogram was performed and dictated separately. IMPRESSION: Ultrasound guided biopsy of superficial, suspicious palpable mass 6:30 position right breast 1 cm from the nipple. No apparent complications. Electronically Signed: By: Curlene Dolphin M.D. On: 07/26/2015 13:52    ASSESSMENT & PLAN:  40 year old African-American female, premenopausal, presented with a palpable right breast mass.  1. 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 -She has 2 different histology of breast cancer in the lower outer quadrant of right breast, one  is triple positive with high-grade, with questionable axillary adenopathy. -We recommend second look ultrasound of her right axilla and biopsy abnormal appearing nodes, for further staging. -We discussed that HER-2 positive breast cancer are more aggressive, with a high risk for  recurrence after surgery. I recommend adjuvant or new adjuvant chemotherapy to reduce risk of cancer recurrence. If she has biopsy confirmed nodal metastasis, I would obtain a restaging CT scan and bone scan to ruled out distant metastasis, and start her on new adjuvant chemotherapy. -If her node biopsy is negative, then she will proceed with right mastectomy and sentinel lymph node biopsy, followed by adjuvant chemotherapy. -The above plan was discussed with her surgeon Dr. Excell Seltzer, radiation oncologist Dr. Pablo Ledger, pt and her family, we are agree.  -Chemotherapy regiment will be determined by her staging, along with and HER-2 therapy. -Given her strong ER and PR positive tumor, ice also strongly recommend her adjuvant antiestrogen therapy.    2. Iron deficient anemia -Her lab tests showed a moderate anemia with hemoglobin 7.8, low MCV 62.7, her serum iron level and transferrin saturation on low, ferritin 25, this is consistent with iron deficient anemia, likely secondary to her menorrhagia. -Given her pending surgery and possible chemotherapy, I recommend her to have IV iron, such as Feraheme, to improve her anemia quickly. Benefit and side effects, especially anaphylactic reaction were discussed with patient, she agrees to proceed.  3. Genetic -Given her young age and positive family history, we strongly recommend her to [facility] genetic counseling for genetic testing to ruled out inheritable breast cancer syndrome. She agrees.  Plan -Second look ultrasound of her right axilla, with possible biopsy -If noted biopsy confirmed metastasis, she will start neoadjuvant chemotherapy first. If node negative, she'll proceed with  right mastectomy and sentinel lymph node biopsy -Echocardiogram and cardiology referral  -I'll see her back in a few weeks if she undergo neuadjuvant chemotherapy, or 2 weeks after surgery if she undergoes surgery first. -IV Feraheme '510mg'$  iv weekly X2  -Genetic and refer  All questions were answered. The patient and her family members agree with the above plan. The patient knows to call the clinic with any problems, questions or concerns. I spent 55 minutes counseling the patient face to face. The total time spent in the appointment was 80 minutes and more than 50% was on counseling.     Truitt Merle, MD 08/03/2015 10:32 PM

## 2015-08-03 NOTE — Progress Notes (Signed)
Radiation Oncology         [phone]) [phone] ________________________________  Initial Outpatient Consultation - Date: 08/03/2015   Name: Melanie Moss MRN: [id number]   DOB: 11/18/1975  REFERRING PHYSICIAN: Excell Seltzer, MD  DIAGNOSIS AND STAGE: Stage I Invasive Ductal carcinoma of the lower-outer quadrant of right female breast.(LN biopsy and extent of disease pending.   HISTORY OF PRESENT ILLNESS::Melanie Moss is a 40 y.o. female who presents today after she palpated a lump in her right breast. She had a mammogram and ultrasound of the breast 07/25/2015 showing an 11 mm mass in the 8 o'clock position of the right breast. She had a biopsy 07/26/2015, revealing invasive ductal carcinoma in the mass at the 8 o'clock position that was grade 3, ER (95%), PR (95%), Her2-neu positive, and Ki 67 (30%). 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 She had an MRI 08/02/2015 showing a 13 cm area of non mass enhancement between the two biopsy sites, at least 3 to 4 abnormal appearing right axillary lymph nodes, and it showed 2 additional masses. She presents to the multidisciplinary breast clinic today to discuss her treatment options. She is a friend of [name].   PREVIOUS RADIATION THERAPY: No  Past medical, social and family history were reviewed in the electronic chart. Review of symptoms was reviewed in the electronic chart. Medications were reviewed in the electronic chart.   PHYSICAL EXAM: There were no vitals filed for this visit..   Vitals with BMI 08/03/2015  Height '5\' 4"'$   Weight 145 lbs 5 oz  BMI 25  Systolic 578  Diastolic 97  Pulse 76  Respirations 18   General: Alert and oriented, in no acute distress. She is tearful. Neck: Neck is supple, no palpable cervical or supraclavicular  lymphadenopathy. Extremities: No cyanosis or edema. Lymphatics: see Neck Exam Skin: No concerning lesions. Psychiatric: Judgment and insight are intact. Affect is appropriate. Breast: She had multiple palpable masses within the right breast. No bruising. No palpable adenopathy on the right. On the left she had multiple palpable masses, but her mammogram and ultrasound were negative on this side.   Gynecologic History  Age at first menstrual period? 10  Are you still having periods? Yes Approximate date of last period? 07/29/2015  If you are still having periods: Are your periods regular? No Obstetric History:  How many children have you carried to term? 1 Your age at first live birth? 51  Pregnant now or trying to get pregnant? No  Have you used birth control pills or hormone shots for contraception? No Health Maintenance:  Have you ever had a colonoscopy? No  Have you ever had a bone density? No  Date of your last PAP smear? 07/19/2015 Date of your FIRST mammogram? 06/28/2015   IMPRESSION: Melanie Moss is a 40 yo female with breast cancer of the lower outer quadrant of the right female breast. She is a good candidate for surgery, chemotherapy, and possible external radiation. She is a good candidate for the Alliance trial.  PLAN: I spoke to the patient today regarding her diagnosis and options for treatment. We discussed the equivalence in terms of survival and local failure between mastectomy and breast conservation. We discussed that she is able to take Herceptin and hormone therapy after treatment. She will get a biopsy of one of the  abnormal lymph nodes. If the biopsy is negative, she will receive and mastectomy and port placement. If the biopsy is positive, she will receive neoadjuvant chemotherapy, following with a mastectomy, sentinel lymph node biopsy, and she will need radiation.. If she does need radiation, I will follow up with her after surgery.   I spent 40 minutes  face to face with  the patient and more than 50% of that time was spent in counseling and/or coordination of care.   ------------------------------------------------  Thea Silversmith, MD    This document serves as a record of services personally performed by Thea Silversmith, MD. It was created on her behalf by  Lendon Collar, a trained medical scribe. The creation of this record is based on the scribe's personal observations and the provider's statements to them. This document has been checked and approved by the attending provider.

## 2015-08-04 ENCOUNTER — Telehealth: Payer: Self-pay | Admitting: *Deleted

## 2015-08-04 NOTE — Telephone Encounter (Signed)
Per staff message and POF I have scheduled appts. Advised scheduler of appts. JMW  

## 2015-08-05 ENCOUNTER — Telehealth: Payer: Self-pay | Admitting: Hematology

## 2015-08-05 NOTE — Telephone Encounter (Signed)
per pof to sch pt appt-cld * spoke to pt and gave pt time & date for appt for 4/18 &4/25@ 11

## 2015-08-09 ENCOUNTER — Ambulatory Visit (HOSPITAL_BASED_OUTPATIENT_CLINIC_OR_DEPARTMENT_OTHER): Payer: BLUE CROSS/BLUE SHIELD

## 2015-08-09 VITALS — BP 158/88 | HR 72 | Temp 98.4°F

## 2015-08-09 DIAGNOSIS — D509 Iron deficiency anemia, unspecified: Secondary | ICD-10-CM | POA: Diagnosis not present

## 2015-08-09 DIAGNOSIS — D500 Iron deficiency anemia secondary to blood loss (chronic): Secondary | ICD-10-CM

## 2015-08-09 MED ORDER — SODIUM CHLORIDE 0.9 % IV SOLN
510.0000 mg | Freq: Once | INTRAVENOUS | Status: AC
  Administered 2015-08-09: 510 mg via INTRAVENOUS
  Filled 2015-08-09: qty 17

## 2015-08-09 MED ORDER — SODIUM CHLORIDE 0.9 % IV SOLN
Freq: Once | INTRAVENOUS | Status: AC
  Administered 2015-08-09: 11:00:00 via INTRAVENOUS

## 2015-08-09 NOTE — Patient Instructions (Signed)

## 2015-08-10 ENCOUNTER — Telehealth: Payer: Self-pay | Admitting: *Deleted

## 2015-08-10 NOTE — Telephone Encounter (Signed)
Spoke with patient from [facility] 4/12.  She is doing ok. She will have an additional biopsy 4/20.  She will email me her FMLA papers and I will give these to our managed care department.  Encouraged her to call with any needs or concerns.

## 2015-08-11 ENCOUNTER — Other Ambulatory Visit: Payer: Self-pay | Admitting: Hematology

## 2015-08-11 ENCOUNTER — Ambulatory Visit
Admission: RE | Admit: 2015-08-11 | Discharge: 2015-08-11 | Disposition: A | Discharge status: Home / Self Care | Payer: BLUE CROSS/BLUE SHIELD | Source: Ambulatory Visit | Attending: General Surgery | Admitting: General Surgery

## 2015-08-11 ENCOUNTER — Inpatient Hospital Stay
Admission: RE | Admit: 2015-08-11 | Discharge: 2015-08-11 | Disposition: A | Discharge status: Home / Self Care | Payer: BLUE CROSS/BLUE SHIELD | Source: Ambulatory Visit | Attending: General Surgery | Admitting: General Surgery

## 2015-08-11 DIAGNOSIS — C50511 Malignant neoplasm of lower-outer quadrant of right female breast: Secondary | ICD-10-CM

## 2015-08-12 ENCOUNTER — Telehealth: Payer: Self-pay | Admitting: Genetic Counselor

## 2015-08-12 NOTE — Telephone Encounter (Signed)
Discussed with Ms. Ramp that her genetic test result was negative for pathogenic mutations within any of 20 genes on the Breast/Ovarian Cancer Panel that would increase her genetic risk for breast, ovarian, or other related cancers.  Additionally, no uncertain changes were found.  Discussed that this result cannot totally rule out a genetic cause for her cancer, but that most cancers are not genetic.  Discussed that this test result does not necessarily tell us that Ms. Stepanski should be having additional breast cancer screening or that she should necessarily be thinking about different surgical options.  Discussed that she should continue to follow her doctors' recommendations for future cancer screening and that her surgical/treatment plan should continue to be based upon observed history alone.  Ms. Schippers close female relatives can begin annual mammograms at the age of 30 (based on her breast cancer diagnosis at 55) instead of waiting until age 97, if they have not begun mammogram screening already.  Ms. Crivelli is welcome to call me or Roma Kayser with any questions.  She is happy to receive this news and I will let her doctors know about her negative test result.

## 2015-08-15 ENCOUNTER — Inpatient Hospital Stay (HOSPITAL_COMMUNITY)
Admission: RE | Admit: 2015-08-15 | Payer: BLUE CROSS/BLUE SHIELD | Source: Ambulatory Visit | Admitting: Internal Medicine

## 2015-08-15 ENCOUNTER — Encounter (HOSPITAL_COMMUNITY): Payer: Self-pay

## 2015-08-15 ENCOUNTER — Ambulatory Visit (HOSPITAL_BASED_OUTPATIENT_CLINIC_OR_DEPARTMENT_OTHER)
Admission: RE | Admit: 2015-08-15 | Discharge: 2015-08-15 | Disposition: A | Discharge status: Home / Self Care | Payer: BLUE CROSS/BLUE SHIELD | Source: Ambulatory Visit | Attending: Cardiology | Admitting: Cardiology

## 2015-08-15 ENCOUNTER — Ambulatory Visit (HOSPITAL_COMMUNITY)
Admission: RE | Admit: 2015-08-15 | Discharge: 2015-08-15 | Disposition: A | Discharge status: Home / Self Care | Payer: BLUE CROSS/BLUE SHIELD | Source: Ambulatory Visit | Attending: Cardiology | Admitting: Cardiology

## 2015-08-15 VITALS — BP 142/84 | HR 83 | Wt 143.0 lb

## 2015-08-15 DIAGNOSIS — C50511 Malignant neoplasm of lower-outer quadrant of right female breast: Secondary | ICD-10-CM | POA: Diagnosis not present

## 2015-08-15 DIAGNOSIS — E785 Hyperlipidemia, unspecified: Secondary | ICD-10-CM | POA: Insufficient documentation

## 2015-08-15 DIAGNOSIS — K219 Gastro-esophageal reflux disease without esophagitis: Secondary | ICD-10-CM | POA: Insufficient documentation

## 2015-08-15 DIAGNOSIS — I10 Essential (primary) hypertension: Secondary | ICD-10-CM | POA: Diagnosis not present

## 2015-08-15 NOTE — Progress Notes (Signed)
Patient ID: Melanie Moss, female   DOB: 10-Mar-1976, 40 y.o.   MRN: [id number] Oncologist: Dr Burr Medico  40 yo with history of breast cancer presents for cardio-oncology evaluation.  Breast cancer was diagnosed in 4/17, multifocal cancer in right breast.  One biopsied site was ER+/PR+/HER2+.  Other biopsied site was ER+/PR+/HER2-.  She will begin chemotherapy soon with Herceptin to continue x 1 year.    She has no cardiac history.  No chest pain or exertional dyspnea.    Labs (4/17): K 4, creatinine 0.8  PMH: 1. Breast cancer: Diagnosed in 4/17, multifocal cancer in right breast.  One biopsied site was ER+/PR+/HER2+.  Other biopsied site was ER+/PR+/HER2-.  - Echo (4/17): EF 55-60%, GLS -19.9%, lateral s' 13.9 cm/sec 2. Uterine fibroids 3. GERD  Social History   Social History  . Marital Status: Single    Spouse Name: N/A  . Number of Children: 1  . Years of Education: N/A   Occupational History  . Not on file.   Social History Main Topics  . Smoking status: Never Smoker   . Smokeless tobacco: Never Used  . Alcohol Use: No  . Drug Use: No  . Sexual Activity: Yes   Other Topics Concern  . Not on file   Social History Narrative   Family History  Problem Relation Age of Onset  . Hypertension Mother   . Hypertension Father   . Prostate cancer Father 10  . Breast cancer Maternal Aunt 11  . Breast cancer Paternal Aunt 63  . Cirrhosis Maternal Uncle   . Prostate cancer Paternal Uncle   . Throat cancer Maternal Aunt     non smoker  . Prostate cancer Paternal Uncle   . Colon cancer Cousin     maternal first cousin dx in his 66s  . Lung cancer Cousin     smoker   ROS: All systems reviewed and negative except as per HPI.   BP 142/84 mmHg  Pulse 83  Wt 143 lb (64.864 kg)  SpO2 100% General: NAD Neck: No JVD, no thyromegaly or thyroid nodule.  Lungs: Clear to auscultation bilaterally with normal respiratory effort. CV: Nondisplaced PMI.  Heart regular S1/S2, no S3/S4, no  murmur.  No peripheral edema.  No carotid bruit.  Normal pedal pulses.  Abdomen: Soft, nontender, no hepatosplenomegaly, no distention.  Skin: Intact without lesions or rashes.  Neurologic: Alert and oriented x 3.  Psych: Normal affect. Extremities: No clubbing or cyanosis.  HEENT: Normal.   Assessment/Plan: 1. Breast cancer: She will soon begin chemotherapy and will be on Herceptin for 1 year.  We discussed the cardiac risk or Herceptin today, about 10% risk of LV systolic function decline.  We discussed the rationale behind echo screening while getting Herceptin.  I reviewed her baseline echo today, there were no significant abnormalities.  She will return in 3 months for echo and office visit.   Melanie Moss 08/15/2015

## 2015-08-15 NOTE — Patient Instructions (Signed)
Follow up 3 months with echocardiogram and Dr. McLean.   

## 2015-08-15 NOTE — Progress Notes (Signed)
  Echocardiogram 2D Echocardiogram has been performed.  Donata Clay 08/15/2015, 9:45 AM

## 2015-08-16 ENCOUNTER — Ambulatory Visit (HOSPITAL_BASED_OUTPATIENT_CLINIC_OR_DEPARTMENT_OTHER): Payer: BLUE CROSS/BLUE SHIELD

## 2015-08-16 ENCOUNTER — Ambulatory Visit: Payer: Self-pay | Admitting: Genetic Counselor

## 2015-08-16 VITALS — BP 135/93 | HR 91 | Temp 99.2°F | Resp 18

## 2015-08-16 DIAGNOSIS — D500 Iron deficiency anemia secondary to blood loss (chronic): Secondary | ICD-10-CM

## 2015-08-16 DIAGNOSIS — Z800 Family history of malignant neoplasm of digestive organs: Secondary | ICD-10-CM

## 2015-08-16 DIAGNOSIS — Z803 Family history of malignant neoplasm of breast: Secondary | ICD-10-CM

## 2015-08-16 DIAGNOSIS — C50511 Malignant neoplasm of lower-outer quadrant of right female breast: Secondary | ICD-10-CM

## 2015-08-16 DIAGNOSIS — Z8042 Family history of malignant neoplasm of prostate: Secondary | ICD-10-CM

## 2015-08-16 DIAGNOSIS — Z1379 Encounter for other screening for genetic and chromosomal anomalies: Secondary | ICD-10-CM

## 2015-08-16 MED ORDER — SODIUM CHLORIDE 0.9 % IV SOLN
510.0000 mg | Freq: Once | INTRAVENOUS | Status: AC
  Administered 2015-08-16: 510 mg via INTRAVENOUS
  Filled 2015-08-16: qty 17

## 2015-08-16 MED ORDER — SODIUM CHLORIDE 0.9 % IV SOLN
Freq: Once | INTRAVENOUS | Status: AC
  Administered 2015-08-16: 11:00:00 via INTRAVENOUS

## 2015-08-16 NOTE — Progress Notes (Signed)
HPI: [name] was previously seen in the [facility] due to a personal and family history of cancer and concerns regarding a hereditary predisposition to cancer. Please refer to our prior cancer genetics clinic note for more information regarding Melanie Moss's medical, social and family histories, and our assessment and recommendations, at the time. Melanie Moss recent genetic test results were disclosed to her, as were recommendations warranted by these results. These results and recommendations are discussed in more detail below.  FAMILY HISTORY:  We obtained a detailed, 4-generation family history.  Significant diagnoses are listed below: Family History  Problem Relation Age of Onset  . Hypertension Mother   . Hypertension Father   . Prostate cancer Father 1  . Breast cancer Maternal Aunt 22  . Breast cancer Paternal Aunt 69  . Cirrhosis Maternal Uncle   . Prostate cancer Paternal Uncle   . Throat cancer Maternal Aunt     non smoker  . Prostate cancer Paternal Uncle   . Colon cancer Cousin     maternal first cousin dx in his 38s  . Lung cancer Cousin     smoker    The patient has one son who is healthy and cancer free. She has two full sisters and a maternal half sister who are all cancer free. Both parents are deceased. Her mother died at 74 with heart disease and her father died from prostate cancer. Her father had 12 brothers and two sisters. Two brothers were diagnosed with prostate cancer around age 76 and one sister was diagnosed with breast cancer in her 23s. Her mother had 37 sisters and two brothers. One sister ahd breast cancer in her 42s-50s. SHe had two sons one who had colon cancer and the other lung cancer. Another of the patient's mother's sisters had throat cancer and was a non smoker. There is no other reported cancer history Patient's maternal ancestors are of African American descent, and paternal ancestors are of African American descent.  There is no reported Ashkenazi Jewish ancestry. There is no known consanguinity.  GENETIC TEST RESULTS: At the time of Melanie Moss's visit, we recommended she pursue genetic testing of the Breast/Ovarian cancer gene panel. The Breast/Ovarian gene panel offered by [facility] includes sequencing and rearrangement analysis for the following 20 genes:  ATM, BARD1, BRCA1, BRCA2, BRIP1, CDH1, CHEK2, EPCAM, FANCC, MLH1, MSH2, MSH6, NBN, PALB2, PMS2, PTEN, RAD51C, RAD51D, TP53, and XRCC2.   The report date is August 10, 2015.  Genetic testing was normal, and did not reveal a deleterious mutation in these genes. The test report has been scanned into EPIC and is located under the Molecular Pathology section of the Results Review tab.   We discussed with Melanie Moss that since the current genetic testing is not perfect, it is possible there may be a gene mutation in one of these genes that current testing cannot detect, but that chance is small. We also discussed, that it is possible that another gene that has not yet been discovered, or that we have not yet tested, is responsible for the cancer diagnoses in the family, and it is, therefore, important to remain in touch with cancer genetics in the future so that we can continue to offer Melanie Moss the most up to date genetic testing.   CANCER SCREENING RECOMMENDATIONS: This result is reassuring and indicates that Melanie Moss likely does not have an increased risk for a future cancer due to a mutation in one of these genes. This  normal test also suggests that Melanie Moss's cancer was most likely not due to an inherited predisposition associated with one of these genes.  Most cancers happen by chance and this negative test suggests that her cancer falls into this category.  We, therefore, recommended she continue to follow the cancer management and screening guidelines provided by her oncology and primary healthcare provider.   RECOMMENDATIONS FOR FAMILY MEMBERS: Women in this family  might be at some increased risk of developing cancer, over the general population risk, simply due to the family history of cancer. We recommended women in this family have a yearly mammogram beginning at age 59, or 57 years younger than the earliest onset of cancer, an an annual clinical breast exam, and perform monthly breast self-exams. Women in this family should also have a gynecological exam as recommended by their primary provider. All family members should have a colonoscopy by age 15.  FOLLOW-UP: Lastly, we discussed with Melanie Moss that cancer genetics is a rapidly advancing field and it is possible that new genetic tests will be appropriate for her and/or her family members in the future. We encouraged her to remain in contact with cancer genetics on an annual basis so we can update her personal and family histories and let her know of advances in cancer genetics that may benefit this family.   Our contact number was provided. Melanie Moss questions were answered to her satisfaction, and she knows she is welcome to call us at anytime with additional questions or concerns.   Roma Kayser, MS, [facility] Certified Genetic Counselor [email or web address]

## 2015-08-16 NOTE — Patient Instructions (Signed)

## 2015-08-22 ENCOUNTER — Encounter: Payer: Self-pay | Admitting: *Deleted

## 2015-08-22 ENCOUNTER — Other Ambulatory Visit: Payer: Self-pay | Admitting: General Surgery

## 2015-08-22 ENCOUNTER — Encounter: Payer: Self-pay | Admitting: Hematology

## 2015-08-22 DIAGNOSIS — C50311 Malignant neoplasm of lower-inner quadrant of right female breast: Secondary | ICD-10-CM

## 2015-08-22 NOTE — Progress Notes (Signed)
left in pod-let patient know forms are ready for pick up

## 2015-08-22 NOTE — Progress Notes (Signed)
Left for dr to sign

## 2015-08-22 NOTE — Progress Notes (Signed)
[facility] Psychosocial Distress Screening Clinical Social Work  Clinical Social Work was referred by distress screening protocol.  The patient scored a 8 on the Psychosocial Distress Thermometer which indicates severe distress. Clinical Social Worker contacted pt via phone to assess for distress and other psychosocial needs. Pt reports to be adjusting to her illness and taking "each day as it comes". She reports she requested FMLA paperwork to be completed and she waiting on that currently. She feels her anxiety is manageable currently. CSW reviewed resources for support through the [facility] and pt agrees to reach out as needed. Pt appreciated call and she hopes to have her surgery soon.   ONCBCN DISTRESS SCREENING 08/03/2015  Screening Type Initial Screening  Distress experienced in past week (1-10) 8  Practical problem type Work/school  Emotional problem type Nervousness/Anxiety;Adjusting to illness  Information Concerns Type Lack of info about diagnosis;Lack of info about treatment  Physical Problem type Sleep/insomnia;Loss of appetitie;Talking    Clinical Social Worker follow up needed: No.  If yes, follow up plan:  Loren Racer, [place]  [facility] Phone: [phone] Fax: [phone]

## 2015-09-01 ENCOUNTER — Other Ambulatory Visit: Payer: Self-pay | Admitting: *Deleted

## 2015-09-01 ENCOUNTER — Telehealth: Payer: Self-pay | Admitting: Hematology

## 2015-09-01 NOTE — Telephone Encounter (Signed)
lvm to inform patient of appts on 6/26 per 5/11 pof

## 2015-09-16 ENCOUNTER — Encounter (HOSPITAL_COMMUNITY): Payer: Self-pay

## 2015-09-21 ENCOUNTER — Encounter (HOSPITAL_COMMUNITY): Payer: Self-pay

## 2015-09-21 ENCOUNTER — Other Ambulatory Visit (HOSPITAL_COMMUNITY): Payer: Self-pay | Admitting: *Deleted

## 2015-09-21 ENCOUNTER — Encounter (HOSPITAL_COMMUNITY)
Admission: RE | Admit: 2015-09-21 | Discharge: 2015-09-21 | Disposition: A | Discharge status: Home / Self Care | Payer: BLUE CROSS/BLUE SHIELD | Source: Ambulatory Visit | Attending: General Surgery | Admitting: General Surgery

## 2015-09-21 DIAGNOSIS — C50911 Malignant neoplasm of unspecified site of right female breast: Secondary | ICD-10-CM | POA: Diagnosis not present

## 2015-09-21 DIAGNOSIS — Z01812 Encounter for preprocedural laboratory examination: Secondary | ICD-10-CM | POA: Insufficient documentation

## 2015-09-21 DIAGNOSIS — Z01818 Encounter for other preprocedural examination: Secondary | ICD-10-CM | POA: Diagnosis not present

## 2015-09-21 HISTORY — DX: Anxiety disorder, unspecified: F41.9

## 2015-09-21 LAB — BASIC METABOLIC PANEL
Anion gap: 7 (ref 5–15)
BUN: 10 mg/dL (ref 6–20)
CO2: 25 mmol/L (ref 22–32)
Calcium: 9.4 mg/dL (ref 8.9–10.3)
Chloride: 107 mmol/L (ref 101–111)
Creatinine, Ser: 0.77 mg/dL (ref 0.44–1.00)
GFR calc Af Amer: >60 mL/min (ref >60)
GFR calc non Af Amer: >60 mL/min (ref >60)
Glucose, Bld: 96 mg/dL (ref 65–99)
Potassium: 3.8 mmol/L (ref 3.5–5.1)
Sodium: 139 mmol/L (ref 135–145)

## 2015-09-21 LAB — CBC
HCT: 40.2 % (ref 36.0–46.0)
Hemoglobin: 12.6 g/dL (ref 12.0–15.0)
MCH: 25.6 pg — ABNORMAL LOW (ref 26.0–34.0)
MCHC: 31.3 g/dL (ref 30.0–36.0)
MCV: 81.7 fL (ref 78.0–100.0)
Platelets: 298 10*3/uL (ref 150–400)
RBC: 4.92 MIL/uL (ref 3.87–5.11)
RDW: 24.2 % — ABNORMAL HIGH (ref 11.5–15.5)
WBC: 6.3 10*3/uL (ref 4.0–10.5)

## 2015-09-21 LAB — HCG, SERUM, QUALITATIVE: Preg, Serum: NEGATIVE

## 2015-09-21 NOTE — Pre-Procedure Instructions (Addendum)
    Melanie Moss  09/21/2015      [id number], [place] [address] Phone: [phone] Fax: [phone]    Your procedure is scheduled on 09-29-2015  Thursday    Report to [facility] Admitting at 5:30 A.M.   Call this number if you have problems the morning of surgery:  [phone]   Remember:  Do not eat food or drink liquids after midnight.   Take these medicines the morning of surgery with A SIP OF WATER Diazepam (Valium),pain medication if needed               Stop all blood thinners,aspirin,ibuprofen,advil,aleve,Goody's Powders,vitamin, and herbal supplements 5 days prior to surgery   Do not wear jewelry, make-up or nail polish.  Do not wear lotions, powders, or perfumes.  You may NOT wear deodorant.  Do not shave 48 hours prior to surgery.   .  Do not bring valuables to the hospital.  [facility] is not responsible for any belongings or valuables.  Contacts, dentures or bridgework may not be worn into surgery.  Leave your suitcase in the car.  After surgery it may be brought to your room.  For patients admitted to the hospital, discharge time will be determined by your treatment team.  Patients discharged the day of surgery will not be allowed to drive home.    Special instructions:  See attached Sheet for instructions on CHG showers  Please read over the following fact sheets that you were given. Coughing and Deep Breathing and Surgical Site Infection Prevention

## 2015-09-21 NOTE — Progress Notes (Signed)
Message left with Dr. Eusebio Friendly office that we need orders placed in Epic for surgery on 09-29-2015

## 2015-09-22 ENCOUNTER — Other Ambulatory Visit: Payer: Self-pay | Admitting: General Surgery

## 2015-09-22 DIAGNOSIS — C50111 Malignant neoplasm of central portion of right female breast: Secondary | ICD-10-CM

## 2015-09-26 ENCOUNTER — Other Ambulatory Visit: Payer: Self-pay | Admitting: Plastic Surgery

## 2015-09-26 DIAGNOSIS — C50911 Malignant neoplasm of unspecified site of right female breast: Secondary | ICD-10-CM

## 2015-09-26 NOTE — H&P (Signed)
Melanie Moss is an 40 y.o. female.   Chief Complaint: Right Breast Cancer HPI:  The patient is a 40 yo female here for treatment of right breast cancer with immediate reconstruction with expander and Acellular dermal matrix placement. She felt a mass on her RIGHT breast and went for a mammogram and ultrasound 07/2015. The biopsy of the lesion at the 8 o'clock position showed invasive ductal carcinoma Grade 3, ER/PR positive, Her2-neu positive, Ki 67 (30%). 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 The MRI showed 3 - 4 abnormal appearing right axillary lymph nodes. An ultrasound guided biopsy of the axilla is planned. She is 5 feet 3 inches tall, weights 143 pounds, preop bra = 34 DDD. She is BRCA negative. She is married and has a 1 yrs old son.   Past Medical History  Diagnosis Date  . Preterm labor   . Anemia   . Hyperlipidemia   . Gestational diabetes   . Breast cancer of lower-outer quadrant of right female breast ([place]) 07/29/2015  . Family history of breast cancer   . Family history of colon cancer   . Family history of prostate cancer   . Hypertension     not currently on medication  . Anxiety   . GERD (gastroesophageal reflux disease)     tums     Past Surgical History  Procedure Laterality Date  . Dilation and curettage of uterus    . Cervical cerclage  02/19/2011    Procedure: CERCLAGE CERVICAL;  Surgeon: Agnes Lawrence, MD;  Location: [facility];  Service: Gynecology;  Laterality: N/A;    Family History  Problem Relation Age of Onset  . Hypertension Mother   . Hypertension Father   . Prostate cancer Father 27  . Breast cancer Maternal Aunt 2  . Breast cancer Paternal Aunt 69  . Cirrhosis Maternal Uncle   . Prostate cancer Paternal Uncle   . Throat cancer Maternal Aunt     non smoker  . Prostate cancer Paternal Uncle   . Colon  cancer Cousin     maternal first cousin dx in his 22s  . Lung cancer Cousin     smoker   Social History:  reports that she has never smoked. She has never used smokeless tobacco. She reports that she does not drink alcohol or use illicit drugs.  Allergies: No Known Allergies   (Not in a hospital admission)  No results found for this or any previous visit (from the past 48 hour(s)). No results found.  Review of Systems  Constitutional: Negative.   HENT: Negative.   Eyes: Negative.   Respiratory: Negative.   Cardiovascular: Negative.   Gastrointestinal: Negative.   Genitourinary: Negative.   Musculoskeletal: Negative.   Skin: Negative.   Neurological: Negative.   Psychiatric/Behavioral: Negative.     Last menstrual period 09/21/2015. Physical Exam  Constitutional: She is oriented to person, place, and time. She appears well-developed and well-nourished.  HENT:  Head: Normocephalic and atraumatic.  Eyes: Conjunctivae and EOM are normal. Pupils are equal, round, and reactive to light.  Cardiovascular: Normal rate.   Respiratory: Effort normal. No respiratory distress.  GI: Soft. She exhibits no distension. There is no tenderness.  Neurological: She is alert and oriented to person, place, and time.  Skin: Skin is warm.  Psychiatric: She has a normal mood and affect. Her behavior is normal.  Judgment and thought content normal.     Assessment/Plan Plan for right breast immediate reconstruction with expander and FlexHD placement.  The risks that can be encountered with and after placement of a breast expander placement were discussed and include the following but not limited to these: bleeding, infection, delayed healing, anesthesia risks, skin sensation changes, injury to structures including nerves, blood vessels, and muscles which may be temporary or permanent, allergies to tape, suture materials and glues, blood products, topical preparations or injected agents, skin contour  irregularities, skin discoloration and swelling, deep vein thrombosis, cardiac and pulmonary complications, pain, which may persist, fluid accumulation, wrinkling of the skin over the expander, changes in nipple or breast sensation, expander leakage or rupture, faulty position of the expander, persistent pain, formation of tight scar tissue around the expander (capsular contracture), possible need for revisional surgery or staged procedures.  Wallace Going, DO 09/26/2015, 7:56 PM

## 2015-09-26 NOTE — Progress Notes (Signed)
Dr Dillingham's office called, message left on nurses line that we need orders to be placed.

## 2015-09-29 ENCOUNTER — Inpatient Hospital Stay (HOSPITAL_COMMUNITY)
Admission: RE | Admit: 2015-09-29 | Discharge: 2015-09-30 | DRG: 581 | Disposition: A | Discharge status: Home / Self Care | Payer: BLUE CROSS/BLUE SHIELD | Source: Ambulatory Visit | Attending: Plastic Surgery | Admitting: Plastic Surgery

## 2015-09-29 ENCOUNTER — Inpatient Hospital Stay (HOSPITAL_COMMUNITY): Payer: BLUE CROSS/BLUE SHIELD

## 2015-09-29 ENCOUNTER — Encounter (HOSPITAL_COMMUNITY): Payer: Self-pay | Admitting: *Deleted

## 2015-09-29 ENCOUNTER — Inpatient Hospital Stay (HOSPITAL_COMMUNITY): Payer: BLUE CROSS/BLUE SHIELD | Admitting: Anesthesiology

## 2015-09-29 ENCOUNTER — Encounter (HOSPITAL_COMMUNITY)
Admission: RE | Disposition: A | Discharge status: Home / Self Care | Payer: Self-pay | Source: Ambulatory Visit | Attending: Plastic Surgery

## 2015-09-29 ENCOUNTER — Encounter (HOSPITAL_COMMUNITY)
Admission: RE | Admit: 2015-09-29 | Discharge: 2015-09-29 | Disposition: A | Discharge status: Home / Self Care | Payer: BLUE CROSS/BLUE SHIELD | Source: Ambulatory Visit | Attending: General Surgery | Admitting: General Surgery

## 2015-09-29 ENCOUNTER — Other Ambulatory Visit: Payer: BLUE CROSS/BLUE SHIELD

## 2015-09-29 DIAGNOSIS — C50511 Malignant neoplasm of lower-outer quadrant of right female breast: Secondary | ICD-10-CM | POA: Diagnosis present

## 2015-09-29 DIAGNOSIS — D649 Anemia, unspecified: Secondary | ICD-10-CM | POA: Diagnosis present

## 2015-09-29 DIAGNOSIS — C50311 Malignant neoplasm of lower-inner quadrant of right female breast: Secondary | ICD-10-CM

## 2015-09-29 DIAGNOSIS — F419 Anxiety disorder, unspecified: Secondary | ICD-10-CM | POA: Diagnosis present

## 2015-09-29 DIAGNOSIS — Z09 Encounter for follow-up examination after completed treatment for conditions other than malignant neoplasm: Secondary | ICD-10-CM

## 2015-09-29 DIAGNOSIS — Z803 Family history of malignant neoplasm of breast: Secondary | ICD-10-CM

## 2015-09-29 DIAGNOSIS — Z170 Estrogen receptor positive status [ER+]: Secondary | ICD-10-CM

## 2015-09-29 DIAGNOSIS — Z8042 Family history of malignant neoplasm of prostate: Secondary | ICD-10-CM

## 2015-09-29 DIAGNOSIS — E785 Hyperlipidemia, unspecified: Secondary | ICD-10-CM | POA: Diagnosis present

## 2015-09-29 DIAGNOSIS — Z95828 Presence of other vascular implants and grafts: Secondary | ICD-10-CM

## 2015-09-29 DIAGNOSIS — Z419 Encounter for procedure for purposes other than remedying health state, unspecified: Secondary | ICD-10-CM

## 2015-09-29 DIAGNOSIS — C50911 Malignant neoplasm of unspecified site of right female breast: Secondary | ICD-10-CM

## 2015-09-29 DIAGNOSIS — K219 Gastro-esophageal reflux disease without esophagitis: Secondary | ICD-10-CM | POA: Diagnosis present

## 2015-09-29 DIAGNOSIS — Z800 Family history of malignant neoplasm of digestive organs: Secondary | ICD-10-CM | POA: Diagnosis not present

## 2015-09-29 HISTORY — PX: BREAST RECONSTRUCTION WITH PLACEMENT OF TISSUE EXPANDER AND FLEX HD (ACELLULAR HYDRATED DERMIS): SHX6295

## 2015-09-29 HISTORY — PX: AXILLARY LYMPH NODE DISSECTION: SHX5229

## 2015-09-29 HISTORY — PX: SIMPLE MASTECTOMY WITH AXILLARY SENTINEL NODE BIOPSY: SHX6098

## 2015-09-29 HISTORY — PX: PORTACATH PLACEMENT: SHX2246

## 2015-09-29 HISTORY — PX: MASTECTOMY WITH AXILLARY LYMPH NODE DISSECTION: SHX5661

## 2015-09-29 LAB — BASIC METABOLIC PANEL
Anion gap: 9 (ref 5–15)
BUN: 6 mg/dL (ref 6–20)
CO2: 23 mmol/L (ref 22–32)
Calcium: 8.6 mg/dL — ABNORMAL LOW (ref 8.9–10.3)
Chloride: 101 mmol/L (ref 101–111)
Creatinine, Ser: 0.73 mg/dL (ref 0.44–1.00)
GFR calc Af Amer: >60 mL/min (ref >60)
GFR calc non Af Amer: >60 mL/min (ref >60)
Glucose, Bld: 203 mg/dL — ABNORMAL HIGH (ref 65–99)
Potassium: 3.6 mmol/L (ref 3.5–5.1)
Sodium: 133 mmol/L — ABNORMAL LOW (ref 135–145)

## 2015-09-29 LAB — CBC
HCT: 35 % — ABNORMAL LOW (ref 36.0–46.0)
Hemoglobin: 11 g/dL — ABNORMAL LOW (ref 12.0–15.0)
MCH: 25.8 pg — ABNORMAL LOW (ref 26.0–34.0)
MCHC: 31.4 g/dL (ref 30.0–36.0)
MCV: 82.2 fL (ref 78.0–100.0)
Platelets: 312 10*3/uL (ref 150–400)
RBC: 4.26 MIL/uL (ref 3.87–5.11)
RDW: 22.1 % — ABNORMAL HIGH (ref 11.5–15.5)
WBC: 14.8 10*3/uL — ABNORMAL HIGH (ref 4.0–10.5)

## 2015-09-29 SURGERY — SIMPLE MASTECTOMY WITH AXILLARY SENTINEL NODE BIOPSY
Anesthesia: Regional | Site: Chest | Laterality: Right

## 2015-09-29 MED ORDER — SCOPOLAMINE 1 MG/3DAYS TD PT72
MEDICATED_PATCH | TRANSDERMAL | Status: DC | PRN
  Administered 2015-09-29: 1 via TRANSDERMAL

## 2015-09-29 MED ORDER — 0.9 % SODIUM CHLORIDE (POUR BTL) OPTIME
TOPICAL | Status: DC | PRN
  Administered 2015-09-29 (×2): 1000 mL

## 2015-09-29 MED ORDER — SCOPOLAMINE 1 MG/3DAYS TD PT72
MEDICATED_PATCH | TRANSDERMAL | Status: AC
  Filled 2015-09-29: qty 1

## 2015-09-29 MED ORDER — CEFAZOLIN SODIUM 1-5 GM-% IV SOLN
1.0000 g | Freq: Three times a day (TID) | INTRAVENOUS | Status: DC
  Administered 2015-09-29 – 2015-09-30 (×3): 1 g via INTRAVENOUS
  Filled 2015-09-29 (×4): qty 50

## 2015-09-29 MED ORDER — NEOSTIGMINE METHYLSULFATE 10 MG/10ML IV SOLN
INTRAVENOUS | Status: DC | PRN
  Administered 2015-09-29: 1 mg via INTRAVENOUS

## 2015-09-29 MED ORDER — FENTANYL CITRATE (PF) 100 MCG/2ML IJ SOLN
INTRAMUSCULAR | Status: DC | PRN
  Administered 2015-09-29 (×7): 50 ug via INTRAVENOUS

## 2015-09-29 MED ORDER — SODIUM CHLORIDE 0.9 % IJ SOLN
INTRAMUSCULAR | Status: AC
  Filled 2015-09-29: qty 10

## 2015-09-29 MED ORDER — DEXAMETHASONE SODIUM PHOSPHATE 10 MG/ML IJ SOLN
INTRAMUSCULAR | Status: DC | PRN
  Administered 2015-09-29: 10 mg via INTRAVENOUS

## 2015-09-29 MED ORDER — HEPARIN SOD (PORK) LOCK FLUSH 100 UNIT/ML IV SOLN
INTRAVENOUS | Status: AC
  Filled 2015-09-29: qty 5

## 2015-09-29 MED ORDER — DIPHENHYDRAMINE HCL 50 MG/ML IJ SOLN: 12.5000 mg | Freq: Four times a day (QID) | INTRAMUSCULAR | Status: DC | PRN

## 2015-09-29 MED ORDER — HYDROCODONE-ACETAMINOPHEN 5-325 MG PO TABS
1.0000 | ORAL_TABLET | ORAL | Status: DC | PRN
  Administered 2015-09-29 – 2015-09-30 (×2): 2 via ORAL
  Filled 2015-09-29 (×2): qty 2

## 2015-09-29 MED ORDER — ONDANSETRON HCL 4 MG/2ML IJ SOLN
INTRAMUSCULAR | Status: AC
  Filled 2015-09-29: qty 2

## 2015-09-29 MED ORDER — TECHNETIUM TC 99M SULFUR COLLOID FILTERED
1.0000 | Freq: Once | INTRAVENOUS | Status: AC | PRN
  Administered 2015-09-29: 1 via INTRADERMAL

## 2015-09-29 MED ORDER — BUPIVACAINE-EPINEPHRINE (PF) 0.5% -1:200000 IJ SOLN
INTRAMUSCULAR | Status: DC | PRN
  Administered 2015-09-29: 30 mL via PERINEURAL

## 2015-09-29 MED ORDER — MIDAZOLAM HCL 2 MG/2ML IJ SOLN
INTRAMUSCULAR | Status: AC
  Filled 2015-09-29: qty 2

## 2015-09-29 MED ORDER — OXYCODONE HCL 5 MG PO TABS: 5.0000 mg | ORAL_TABLET | Freq: Once | ORAL | Status: DC | PRN

## 2015-09-29 MED ORDER — HYDROMORPHONE HCL 1 MG/ML IJ SOLN
INTRAMUSCULAR | Status: AC
  Administered 2015-09-29: 0.5 mg via INTRAVENOUS
  Filled 2015-09-29: qty 1

## 2015-09-29 MED ORDER — SUCCINYLCHOLINE CHLORIDE 200 MG/10ML IV SOSY
PREFILLED_SYRINGE | INTRAVENOUS | Status: DC | PRN
  Administered 2015-09-29: 100 mg via INTRAVENOUS

## 2015-09-29 MED ORDER — GLYCOPYRROLATE 0.2 MG/ML IJ SOLN
INTRAMUSCULAR | Status: DC | PRN
  Administered 2015-09-29: 0.2 mg via INTRAVENOUS

## 2015-09-29 MED ORDER — SUCCINYLCHOLINE CHLORIDE 200 MG/10ML IV SOSY
PREFILLED_SYRINGE | INTRAVENOUS | Status: AC
  Filled 2015-09-29: qty 10

## 2015-09-29 MED ORDER — HYDROMORPHONE HCL 1 MG/ML IJ SOLN
1.0000 mg | INTRAMUSCULAR | Status: DC | PRN
Start: 2015-09-29 — End: 2015-09-30
  Administered 2015-09-29 (×2): 1 mg via INTRAVENOUS
  Filled 2015-09-29 (×2): qty 1

## 2015-09-29 MED ORDER — ONDANSETRON HCL 4 MG/2ML IJ SOLN
4.0000 mg | Freq: Four times a day (QID) | INTRAMUSCULAR | Status: DC | PRN
  Administered 2015-09-29: 4 mg via INTRAVENOUS
  Filled 2015-09-29: qty 2

## 2015-09-29 MED ORDER — HEPARIN SOD (PORK) LOCK FLUSH 100 UNIT/ML IV SOLN
INTRAVENOUS | Status: DC | PRN
  Administered 2015-09-29: 500 [IU] via INTRAVENOUS

## 2015-09-29 MED ORDER — ROCURONIUM BROMIDE 50 MG/5ML IV SOLN
INTRAVENOUS | Status: AC
  Filled 2015-09-29: qty 1

## 2015-09-29 MED ORDER — SENNA 8.6 MG PO TABS
1.0000 | ORAL_TABLET | Freq: Two times a day (BID) | ORAL | Status: DC
Start: 2015-09-29 — End: 2015-09-30
  Administered 2015-09-29: 8.6 mg via ORAL
  Filled 2015-09-29: qty 1

## 2015-09-29 MED ORDER — DIAZEPAM 2 MG PO TABS: 2.0000 mg | ORAL_TABLET | Freq: Two times a day (BID) | ORAL | Status: DC | PRN

## 2015-09-29 MED ORDER — OXYCODONE HCL 5 MG/5ML PO SOLN: 5.0000 mg | Freq: Once | ORAL | Status: DC | PRN

## 2015-09-29 MED ORDER — ACETAMINOPHEN 500 MG PO TABS
1000.0000 mg | ORAL_TABLET | Freq: Four times a day (QID) | ORAL | Status: DC
  Administered 2015-09-29: 1000 mg via ORAL
  Filled 2015-09-29 (×2): qty 2

## 2015-09-29 MED ORDER — NAPROXEN 250 MG PO TABS: 500.0000 mg | ORAL_TABLET | Freq: Two times a day (BID) | ORAL | Status: DC | PRN

## 2015-09-29 MED ORDER — METHYLENE BLUE 0.5 % INJ SOLN
INTRAVENOUS | Status: AC
  Filled 2015-09-29: qty 10

## 2015-09-29 MED ORDER — BUPIVACAINE-EPINEPHRINE (PF) 0.25% -1:200000 IJ SOLN
INTRAMUSCULAR | Status: AC
  Filled 2015-09-29: qty 30

## 2015-09-29 MED ORDER — LABETALOL HCL 5 MG/ML IV SOLN
INTRAVENOUS | Status: DC | PRN
  Administered 2015-09-29: 5 mg via INTRAVENOUS

## 2015-09-29 MED ORDER — ACETAMINOPHEN 160 MG/5ML PO SOLN
325.0000 mg | ORAL | Status: DC | PRN
  Filled 2015-09-29: qty 20.3

## 2015-09-29 MED ORDER — DEXAMETHASONE SODIUM PHOSPHATE 10 MG/ML IJ SOLN
INTRAMUSCULAR | Status: AC
  Filled 2015-09-29: qty 1

## 2015-09-29 MED ORDER — SODIUM CHLORIDE 0.9 % IR SOLN
Status: DC | PRN
  Administered 2015-09-29: 500 mL

## 2015-09-29 MED ORDER — PROPOFOL 10 MG/ML IV BOLUS
INTRAVENOUS | Status: DC | PRN
  Administered 2015-09-29: 170 mg via INTRAVENOUS

## 2015-09-29 MED ORDER — MIDAZOLAM HCL 5 MG/5ML IJ SOLN
INTRAMUSCULAR | Status: DC | PRN
  Administered 2015-09-29 (×2): 1 mg via INTRAVENOUS

## 2015-09-29 MED ORDER — PROPOFOL 10 MG/ML IV BOLUS
INTRAVENOUS | Status: AC
  Filled 2015-09-29: qty 40

## 2015-09-29 MED ORDER — ROCURONIUM BROMIDE 100 MG/10ML IV SOLN
INTRAVENOUS | Status: DC | PRN
  Administered 2015-09-29: 20 mg via INTRAVENOUS

## 2015-09-29 MED ORDER — IOPAMIDOL (ISOVUE-300) INJECTION 61%
INTRAVENOUS | Status: AC
  Filled 2015-09-29: qty 50

## 2015-09-29 MED ORDER — PHENYLEPHRINE HCL 10 MG/ML IJ SOLN
INTRAMUSCULAR | Status: DC | PRN
  Administered 2015-09-29 (×3): 40 ug via INTRAVENOUS

## 2015-09-29 MED ORDER — SODIUM BICARBONATE 4 % IV SOLN
INTRAVENOUS | Status: AC
  Filled 2015-09-29: qty 5

## 2015-09-29 MED ORDER — ACETAMINOPHEN 325 MG PO TABS: 325.0000 mg | ORAL_TABLET | ORAL | Status: DC | PRN

## 2015-09-29 MED ORDER — LIDOCAINE HCL (PF) 1 % IJ SOLN
INTRAMUSCULAR | Status: AC
  Filled 2015-09-29: qty 30

## 2015-09-29 MED ORDER — ONDANSETRON HCL 4 MG/2ML IJ SOLN
INTRAMUSCULAR | Status: DC | PRN
  Administered 2015-09-29 (×2): 4 mg via INTRAVENOUS

## 2015-09-29 MED ORDER — DIPHENHYDRAMINE HCL 12.5 MG/5ML PO ELIX: 12.5000 mg | ORAL_SOLUTION | Freq: Four times a day (QID) | ORAL | Status: DC | PRN

## 2015-09-29 MED ORDER — CEFAZOLIN SODIUM-DEXTROSE 2-4 GM/100ML-% IV SOLN
2.0000 g | INTRAVENOUS | Status: AC
  Administered 2015-09-29: 2 g via INTRAVENOUS

## 2015-09-29 MED ORDER — CEFAZOLIN SODIUM-DEXTROSE 2-4 GM/100ML-% IV SOLN
INTRAVENOUS | Status: AC
  Filled 2015-09-29: qty 100

## 2015-09-29 MED ORDER — LACTATED RINGERS IV SOLN
INTRAVENOUS | Status: DC | PRN
  Administered 2015-09-29 (×3): via INTRAVENOUS

## 2015-09-29 MED ORDER — KCL IN DEXTROSE-NACL 20-5-0.45 MEQ/L-%-% IV SOLN
INTRAVENOUS | Status: AC
  Filled 2015-09-29: qty 1000

## 2015-09-29 MED ORDER — FENTANYL CITRATE (PF) 250 MCG/5ML IJ SOLN
INTRAMUSCULAR | Status: AC
  Filled 2015-09-29: qty 5

## 2015-09-29 MED ORDER — SODIUM CHLORIDE 0.9 % IV SOLN
INTRAVENOUS | Status: DC | PRN
  Administered 2015-09-29: 07:00:00

## 2015-09-29 MED ORDER — POLYETHYLENE GLYCOL 3350 17 G PO PACK: 17.0000 g | PACK | Freq: Every day | ORAL | Status: DC | PRN

## 2015-09-29 MED ORDER — LABETALOL HCL 5 MG/ML IV SOLN
INTRAVENOUS | Status: AC
  Filled 2015-09-29: qty 4

## 2015-09-29 MED ORDER — ONDANSETRON 4 MG PO TBDP: 4.0000 mg | ORAL_TABLET | Freq: Four times a day (QID) | ORAL | Status: DC | PRN

## 2015-09-29 MED ORDER — KCL IN DEXTROSE-NACL 20-5-0.45 MEQ/L-%-% IV SOLN
INTRAVENOUS | Status: DC
  Administered 2015-09-29 – 2015-09-30 (×2): via INTRAVENOUS
  Filled 2015-09-29 (×2): qty 1000

## 2015-09-29 MED ORDER — HYDROMORPHONE HCL 1 MG/ML IJ SOLN
0.2500 mg | INTRAMUSCULAR | Status: DC | PRN
  Administered 2015-09-29 (×2): 0.5 mg via INTRAVENOUS

## 2015-09-29 MED ORDER — LIDOCAINE HCL (CARDIAC) 20 MG/ML IV SOLN
INTRAVENOUS | Status: DC | PRN
  Administered 2015-09-29: 40 mg via INTRAVENOUS

## 2015-09-29 SURGICAL SUPPLY — 115 items
ADH SKN CLS APL DERMABOND .7 (GAUZE/BANDAGES/DRESSINGS) ×3
APPLIER CLIP 13 LRG OPEN (CLIP) ×4
APPLIER CLIP 9.375 MED OPEN (MISCELLANEOUS) ×8
APR CLP LRG 13 20 CLIP (CLIP) ×3
APR CLP MED 9.3 20 MLT OPN (MISCELLANEOUS) ×6
BAG DECANTER FOR FLEXI CONT (MISCELLANEOUS) ×8 IMPLANT
BINDER BREAST XLRG (GAUZE/BANDAGES/DRESSINGS) ×1 IMPLANT
BIOPATCH RED 1 DISK 7.0 (GAUZE/BANDAGES/DRESSINGS) ×8 IMPLANT
BLADE SURG 15 STRL LF DISP TIS (BLADE) IMPLANT
BLADE SURG 15 STRL SS (BLADE) ×4
CANISTER SUCTION 2500CC (MISCELLANEOUS) ×11 IMPLANT
CHLORAPREP W/TINT 10.5 ML (MISCELLANEOUS) ×3 IMPLANT
CHLORAPREP W/TINT 26ML (MISCELLANEOUS) ×8 IMPLANT
CLIP APPLIE 13 LRG OPEN (CLIP) IMPLANT
CLIP APPLIE 9.375 MED OPEN (MISCELLANEOUS) IMPLANT
CLIP TI MEDIUM 6 (CLIP) ×4 IMPLANT
CONT SPEC 4OZ CLIKSEAL STRL BL (MISCELLANEOUS) ×9 IMPLANT
COVER PROBE W GEL 5X96 (DRAPES) ×4 IMPLANT
COVER SURGICAL LIGHT HANDLE (MISCELLANEOUS) ×7 IMPLANT
COVER TRANSDUCER ULTRASND GEL (DRAPE) IMPLANT
CRADLE DONUT ADULT HEAD (MISCELLANEOUS) ×3 IMPLANT
DECANTER SPIKE VIAL GLASS SM (MISCELLANEOUS) IMPLANT
DERMABOND ADVANCED (GAUZE/BANDAGES/DRESSINGS) ×1
DERMABOND ADVANCED .7 DNX12 (GAUZE/BANDAGES/DRESSINGS) ×3 IMPLANT
DEVICE DISSECT PLASMABLAD 3.0S (MISCELLANEOUS) ×3 IMPLANT
DRAIN CHANNEL 19F RND (DRAIN) ×8 IMPLANT
DRAPE C-ARM 42X72 X-RAY (DRAPES) ×4 IMPLANT
DRAPE CHEST BREAST 15X10 FENES (DRAPES) ×4 IMPLANT
DRAPE ORTHO SPLIT 77X108 STRL (DRAPES) ×8
DRAPE PROXIMA HALF (DRAPES) ×4 IMPLANT
DRAPE SURG 17X23 STRL (DRAPES) ×20 IMPLANT
DRAPE SURG ORHT 6 SPLT 77X108 (DRAPES) ×6 IMPLANT
DRAPE UTILITY XL STRL (DRAPES) ×9 IMPLANT
DRAPE WARM FLUID 44X44 (DRAPE) ×4 IMPLANT
DRSG PAD ABDOMINAL 8X10 ST (GAUZE/BANDAGES/DRESSINGS) ×4 IMPLANT
ELECT BLADE 4.0 EZ CLEAN MEGAD (MISCELLANEOUS) ×4
ELECT CAUTERY BLADE 6.4 (BLADE) ×4 IMPLANT
ELECT REM PT RETURN 9FT ADLT (ELECTROSURGICAL) ×8
ELECTRODE BLDE 4.0 EZ CLN MEGD (MISCELLANEOUS) IMPLANT
ELECTRODE REM PT RTRN 9FT ADLT (ELECTROSURGICAL) ×9 IMPLANT
EVACUATOR SILICONE 100CC (DRAIN) ×8 IMPLANT
GAUZE SPONGE 4X4 12PLY STRL (GAUZE/BANDAGES/DRESSINGS) ×4 IMPLANT
GAUZE SPONGE 4X4 16PLY XRAY LF (GAUZE/BANDAGES/DRESSINGS) ×4 IMPLANT
GEL ULTRASOUND 20GR AQUASONIC (MISCELLANEOUS) IMPLANT
GLOVE BIO SURGEON STRL SZ 6.5 (GLOVE) ×8 IMPLANT
GLOVE BIO SURGEON STRL SZ7.5 (GLOVE) ×1 IMPLANT
GLOVE BIOGEL PI IND STRL 7.0 (GLOVE) IMPLANT
GLOVE BIOGEL PI IND STRL 8 (GLOVE) ×3 IMPLANT
GLOVE BIOGEL PI INDICATOR 7.0 (GLOVE) ×2
GLOVE BIOGEL PI INDICATOR 8 (GLOVE) ×1
GLOVE ECLIPSE 7.0 STRL STRAW (GLOVE) ×1 IMPLANT
GLOVE ECLIPSE 7.5 STRL STRAW (GLOVE) ×4 IMPLANT
GLOVE SURG SS PI 7.0 STRL IVOR (GLOVE) ×2 IMPLANT
GOWN STRL REUS W/ TWL LRG LVL3 (GOWN DISPOSABLE) ×12 IMPLANT
GOWN STRL REUS W/ TWL XL LVL3 (GOWN DISPOSABLE) ×3 IMPLANT
GOWN STRL REUS W/TWL LRG LVL3 (GOWN DISPOSABLE) ×24
GOWN STRL REUS W/TWL XL LVL3 (GOWN DISPOSABLE) ×4
GRAFT FLEX HD 4X16 THICK (Tissue Mesh) ×1 IMPLANT
IMPL BREAST TIS EXP M 350CC (Breast) IMPLANT
IMPLANT BREAST TIS EXP M 350CC (Breast) ×4 IMPLANT
INTRODUCER COOK 11FR (CATHETERS) IMPLANT
IV CATH 14GX2 1/4 (CATHETERS) IMPLANT
KIT BASIN OR (CUSTOM PROCEDURE TRAY) ×8 IMPLANT
KIT PORT POWER 8FR ISP CVUE (Catheter) ×1 IMPLANT
KIT ROOM TURNOVER OR (KITS) ×8 IMPLANT
LIQUID BAND (GAUZE/BANDAGES/DRESSINGS) ×4 IMPLANT
NDL 18GX1X1/2 (RX/OR ONLY) (NEEDLE) ×3 IMPLANT
NDL FILTER BLUNT 18X1 1/2 (NEEDLE) IMPLANT
NDL HYPO 25GX1X1/2 BEV (NEEDLE) ×3 IMPLANT
NEEDLE 18GX1X1/2 (RX/OR ONLY) (NEEDLE) ×4 IMPLANT
NEEDLE 22X1 1/2 (OR ONLY) (NEEDLE) ×4 IMPLANT
NEEDLE FILTER BLUNT 18X 1/2SAF (NEEDLE) ×1
NEEDLE FILTER BLUNT 18X1 1/2 (NEEDLE) ×3 IMPLANT
NEEDLE HYPO 25GX1X1/2 BEV (NEEDLE) ×4 IMPLANT
NS IRRIG 1000ML POUR BTL (IV SOLUTION) ×12 IMPLANT
PACK GENERAL/GYN (CUSTOM PROCEDURE TRAY) ×7 IMPLANT
PACK SURGICAL SETUP 50X90 (CUSTOM PROCEDURE TRAY) ×4 IMPLANT
PAD ABD 8X10 STRL (GAUZE/BANDAGES/DRESSINGS) ×8 IMPLANT
PAD ARMBOARD 7.5X6 YLW CONV (MISCELLANEOUS) ×8 IMPLANT
PEN SKIN MARKING BROAD (MISCELLANEOUS) ×1 IMPLANT
PENCIL BUTTON HOLSTER BLD 10FT (ELECTRODE) ×4 IMPLANT
PIN SAFETY STERILE (MISCELLANEOUS) ×4 IMPLANT
PLASMABLADE 3.0S (MISCELLANEOUS) ×4
SET ASEPTIC TRANSFER (MISCELLANEOUS) ×1 IMPLANT
SET INTRODUCER 12FR PACEMAKER (SHEATH) IMPLANT
SET SHEATH INTRODUCER 10FR (MISCELLANEOUS) IMPLANT
SHEATH COOK PEEL AWAY SET 8F (SHEATH) ×1 IMPLANT
SHEATH COOK PEEL AWAY SET 9F (SHEATH) IMPLANT
SPECIMEN JAR X LARGE (MISCELLANEOUS) ×4 IMPLANT
SPONGE LAP 18X18 X RAY DECT (DISPOSABLE) ×2 IMPLANT
STAPLER VISISTAT 35W (STAPLE) ×1 IMPLANT
SUT ETHILON 2 0 FS 18 (SUTURE) ×5 IMPLANT
SUT MNCRL AB 4-0 PS2 18 (SUTURE) ×10 IMPLANT
SUT MON AB 3-0 SH 27 (SUTURE) ×4
SUT MON AB 3-0 SH27 (SUTURE) ×6 IMPLANT
SUT MON AB 5-0 PS2 18 (SUTURE) ×12 IMPLANT
SUT PDS AB 2-0 CT1 27 (SUTURE) ×14 IMPLANT
SUT PROLENE 2 0 SH DA (SUTURE) ×4 IMPLANT
SUT SILK 2 0 (SUTURE)
SUT SILK 2-0 18XBRD TIE 12 (SUTURE) IMPLANT
SUT SILK 4 0 PS 2 (SUTURE) ×6 IMPLANT
SUT VIC AB 3-0 54X BRD REEL (SUTURE) IMPLANT
SUT VIC AB 3-0 BRD 54 (SUTURE) ×4
SUT VIC AB 3-0 SH 18 (SUTURE) ×4 IMPLANT
SYR 20ML ECCENTRIC (SYRINGE) ×8 IMPLANT
SYR 5ML LL (SYRINGE) ×1 IMPLANT
SYR 5ML LUER SLIP (SYRINGE) ×4 IMPLANT
SYR BULB 3OZ (MISCELLANEOUS) ×4 IMPLANT
SYR BULB IRRIGATION 50ML (SYRINGE) ×1 IMPLANT
SYR CONTROL 10ML LL (SYRINGE) ×5 IMPLANT
TOWEL OR 17X24 6PK STRL BLUE (TOWEL DISPOSABLE) ×7 IMPLANT
TOWEL OR 17X26 10 PK STRL BLUE (TOWEL DISPOSABLE) ×7 IMPLANT
TRAY FOLEY CATH 14FRSI W/METER (CATHETERS) ×1 IMPLANT
TUBE CONNECTING 12X1/4 (SUCTIONS) ×5 IMPLANT
YANKAUER SUCT BULB TIP NO VENT (SUCTIONS) ×1 IMPLANT

## 2015-09-29 NOTE — Op Note (Signed)
Op report    DATE OF OPERATION:  09/29/2015  LOCATION: [facility]  SURGICAL DIVISION: Plastic Surgery  PREOPERATIVE DIAGNOSES:  1. Right Breast cancer.    POSTOPERATIVE DIAGNOSES:  1. Right Breast cancer.   PROCEDURE:  1. Right immediate breast reconstruction with placement of Acellular Dermal Matrix and tissue expanders.  SURGEON: Claire Sanger Dillingham, DO  ASSISTANT: Shawn Rayburn, PA  ANESTHESIA:  General.   COMPLICATIONS: None.   IMPLANTS: Right - Mentor 350 cc expander with 150 cc of normal saline placed Acellular Dermal Matrix 4 x 16 cm  INDICATIONS FOR PROCEDURE:  The patient, Melanie Moss, is a 40 y.o. female born on 24-Jan-1976, is here for immediate first stage breast reconstruction after treatment for breast cancer with placement of right tissue expander and Acellular dermal matrix. MRN: [id number]  CONSENT:  Informed consent was obtained directly from the patient. Risks, benefits and alternatives were fully discussed. Specific risks including but not limited to bleeding, infection, hematoma, seroma, scarring, pain, implant infection, implant extrusion, capsular contracture, asymmetry, wound healing problems, and need for further surgery were all discussed. The patient did have an ample opportunity to have her questions answered to her satisfaction.   DESCRIPTION OF PROCEDURE:  The patient was taken to the operating room by the general surgery team. SCDs were placed and IV antibiotics were given. The patient's chest was prepped and draped in a sterile fashion. A time out was performed and the implants to be used were identified.  Right mastectomy was performed.  Once the general surgery team had completed their portion of the case the patient was rendered to the plastic and reconstructive surgery team.  The pectoralis major muscle was lifted from the chest wall with release of the lateral edge and lateral inframammary fold.  The pocket was irrigated  with antibiotic solution and hemostasis was achieved with electrocautery.  The ADM was then prepared according to the manufacture guidelines and slits placed to help with postoperative fluid management.  The ADM was then sutured to the inferior and lateral edge of the inframammary fold with 2-0 PDS starting with an interrupted stitch and then a running stitch.  The lateral portion was sutured to with interrupted sutures after the expander was placed.  The expander was prepared according to the manufacture guidelines, the air evacuated and then it was placed under the ADM and pectoralis major muscle.  The inferior and lateral tabs were used to secure the expander to the chest wall with 2-0 PDS.  The General surgeon then performed an axillary node dissection.  Once complete, an inframammary fold drain was placed and an axillary drain.  They were secured to the skin with 3-0 Silk.  The deep layers were closed with 3-0 Monocryl followed by 4-0 Monocryl.  The skin was closed with 5-0 Monocryl and then dermabond was applied.  The ABDs and breast binder were placed.  The patient tolerated the procedure well and there were no complications.  The patient was allowed to wake from anesthesia and taken to the recovery room in satisfactory condition.

## 2015-09-29 NOTE — Anesthesia Preprocedure Evaluation (Signed)
Anesthesia Evaluation  Patient identified by MRN, date of birth, ID band Patient awake    Reviewed: Allergy & Precautions, NPO status , Patient's Chart, lab work & pertinent test results  History of Anesthesia Complications Negative for: history of anesthetic complications  Airway Mallampati: II  TM Distance: >3 FB Neck ROM: Full    Dental  (+) Teeth Intact   Pulmonary neg pulmonary ROS,    breath sounds clear to auscultation       Cardiovascular hypertension,  Rhythm:Regular     Neuro/Psych PSYCHIATRIC DISORDERS Anxiety negative neurological ROS     GI/Hepatic negative GI ROS, Neg liver ROS,   Endo/Other  negative endocrine ROSdiabetes  Renal/GU negative Renal ROS  negative genitourinary   Musculoskeletal negative musculoskeletal ROS (+)   Abdominal   Peds negative pediatric ROS (+)  Hematology negative hematology ROS (+)   Anesthesia Other Findings   Reproductive/Obstetrics negative OB ROS                             Anesthesia Physical Anesthesia Plan  ASA: II  Anesthesia Plan: General and Regional   Post-op Pain Management:    Induction: Intravenous  Airway Management Planned: Oral ETT  Additional Equipment: None  Intra-op Plan:   Post-operative Plan: Extubation in OR  Informed Consent: I have reviewed the patients History and Physical, chart, labs and discussed the procedure including the risks, benefits and alternatives for the proposed anesthesia with the patient or authorized representative who has indicated his/her understanding and acceptance.   Dental advisory given  Plan Discussed with: CRNA and Surgeon  Anesthesia Plan Comments:         Anesthesia Quick Evaluation

## 2015-09-29 NOTE — Anesthesia Procedure Notes (Addendum)
Procedure Name: Intubation Date/Time: 09/29/2015 7:56 AM Performed by: Ignacia Bayley Pre-anesthesia Checklist: Patient identified, Emergency Drugs available, Suction available and Patient being monitored Patient Re-evaluated:Patient Re-evaluated prior to inductionOxygen Delivery Method: Circle system utilized Preoxygenation: Pre-oxygenation with 100% oxygen Intubation Type: IV induction Ventilation: Mask ventilation without difficulty Laryngoscope Size: Miller and 2 Grade View: Grade I Tube type: Oral Tube size: 7.0 mm Number of attempts: 1 Airway Equipment and Method: Stylet Placement Confirmation: ETT inserted through vocal cords under direct vision,  positive ETCO2 and breath sounds checked- equal and bilateral Secured at: 20 cm Tube secured with: Tape Dental Injury: Teeth and Oropharynx as per pre-operative assessment    Anesthesia Regional Block:  Pectoralis block  Pre-Anesthetic Checklist: ,, timeout performed, Correct Patient, Correct Site, Correct Laterality, Correct Procedure, Correct Position, site marked, Risks and benefits discussed,  Surgical consent,  Pre-op evaluation,  At surgeon's request and post-op pain management  Laterality: Right  Prep: chloraprep       Needles:  Injection technique: Single-shot  Needle Type: Echogenic Stimulator Needle          Additional Needles:  Procedures: ultrasound guided (picture in chart) Pectoralis block Narrative:  Injection made incrementally with aspirations every 5 mL.  Performed by: Personally  Anesthesiologist: Juandaniel Manfredo  Additional Notes: H+P and labs reviewed, risks and benefits discussed with patient, procedure tolerated well without complications

## 2015-09-29 NOTE — H&P (View-Only) (Signed)
Melanie Moss is an 40 y.o. female.   Chief Complaint: Right Breast Cancer HPI:  The patient is a 40 yo female here for treatment of right breast cancer with immediate reconstruction with expander and Acellular dermal matrix placement. She felt a mass on her RIGHT breast and went for a mammogram and ultrasound 07/2015. The biopsy of the lesion at the 8 o'clock position showed invasive ductal carcinoma Grade 3, ER/PR positive, Her2-neu positive, Ki 67 (30%). 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 The MRI showed 3 - 4 abnormal appearing right axillary lymph nodes. An ultrasound guided biopsy of the axilla is planned. She is 5 feet 3 inches tall, weights 143 pounds, preop bra = 34 DDD. She is BRCA negative. She is married and has a 19 yrs old son.   Past Medical History  Diagnosis Date  . Preterm labor   . Anemia   . Hyperlipidemia   . Gestational diabetes   . Breast cancer of lower-outer quadrant of right female breast ([place]) 07/29/2015  . Family history of breast cancer   . Family history of colon cancer   . Family history of prostate cancer   . Hypertension     not currently on medication  . Anxiety   . GERD (gastroesophageal reflux disease)     tums     Past Surgical History  Procedure Laterality Date  . Dilation and curettage of uterus    . Cervical cerclage  02/19/2011    Procedure: CERCLAGE CERVICAL;  Surgeon: Agnes Lawrence, MD;  Location: [facility];  Service: Gynecology;  Laterality: N/A;    Family History  Problem Relation Age of Onset  . Hypertension Mother   . Hypertension Father   . Prostate cancer Father 84  . Breast cancer Maternal Aunt 40  . Breast cancer Paternal Aunt 87  . Cirrhosis Maternal Uncle   . Prostate cancer Paternal Uncle   . Throat cancer Maternal Aunt     non smoker  . Prostate cancer Paternal Uncle   . Colon  cancer Cousin     maternal first cousin dx in his 52s  . Lung cancer Cousin     smoker   Social History:  reports that she has never smoked. She has never used smokeless tobacco. She reports that she does not drink alcohol or use illicit drugs.  Allergies: No Known Allergies   (Not in a hospital admission)  No results found for this or any previous visit (from the past 48 hour(s)). No results found.  Review of Systems  Constitutional: Negative.   HENT: Negative.   Eyes: Negative.   Respiratory: Negative.   Cardiovascular: Negative.   Gastrointestinal: Negative.   Genitourinary: Negative.   Musculoskeletal: Negative.   Skin: Negative.   Neurological: Negative.   Psychiatric/Behavioral: Negative.     Last menstrual period 09/21/2015. Physical Exam  Constitutional: She is oriented to person, place, and time. She appears well-developed and well-nourished.  HENT:  Head: Normocephalic and atraumatic.  Eyes: Conjunctivae and EOM are normal. Pupils are equal, round, and reactive to light.  Cardiovascular: Normal rate.   Respiratory: Effort normal. No respiratory distress.  GI: Soft. She exhibits no distension. There is no tenderness.  Neurological: She is alert and oriented to person, place, and time.  Skin: Skin is warm.  Psychiatric: She has a normal mood and affect. Her behavior is normal.  Judgment and thought content normal.     Assessment/Plan Plan for right breast immediate reconstruction with expander and FlexHD placement.  The risks that can be encountered with and after placement of a breast expander placement were discussed and include the following but not limited to these: bleeding, infection, delayed healing, anesthesia risks, skin sensation changes, injury to structures including nerves, blood vessels, and muscles which may be temporary or permanent, allergies to tape, suture materials and glues, blood products, topical preparations or injected agents, skin contour  irregularities, skin discoloration and swelling, deep vein thrombosis, cardiac and pulmonary complications, pain, which may persist, fluid accumulation, wrinkling of the skin over the expander, changes in nipple or breast sensation, expander leakage or rupture, faulty position of the expander, persistent pain, formation of tight scar tissue around the expander (capsular contracture), possible need for revisional surgery or staged procedures.  Wallace Going, DO 09/26/2015, 7:56 PM

## 2015-09-29 NOTE — Care Management Note (Signed)
Case Management Note  Patient Details  Name: Melanie Moss MRN: [id number] Date of Birth: Dec 02, 1975  Subjective/Objective:                    Action/Plan:   Expected Discharge Date:                  Expected Discharge Plan:  Home/Self Care  In-House Referral:     Discharge planning Services     Post Acute Care Choice:    Choice offered to:     DME Arranged:    DME Agency:     HH Arranged:    [place] Agency:     Status of Service:  In process, will continue to follow  Medicare Important Message Given:    Date Medicare IM Given:    Medicare IM give by:    Date Additional Medicare IM Given:    Additional Medicare Important Message give by:     If discussed at [place] of Stay Meetings, dates discussed:    Additional Comments:  Marilu Favre, RN 09/29/2015, 2:54 PM

## 2015-09-29 NOTE — Transfer of Care (Signed)
Immediate Anesthesia Transfer of Care Note  Patient: Melanie Moss  Procedure(s) Performed: Procedure(s): RIGHT TOTAL  MASTECTOMY WITH AXILLARY SENTINEL NODE BIOPSY (Right) INSERTION PORT-A-CATH (N/A) BREAST RECONSTRUCTION WITH PLACEMENT OF TISSUE EXPANDER AND FLEX HD (ACELLULAR HYDRATED DERMIS) (Right) AXILLARY LYMPH NODE DISSECTION (Right)  Patient Location: PACU  Anesthesia Type:General  Level of Consciousness: sedated  Airway & Oxygen Therapy: Patient Spontanous Breathing and Patient connected to nasal cannula oxygen  Post-op Assessment: Report given to RN and Post -op Vital signs reviewed and stable  Post vital signs: stable  Last Vitals:  Filed Vitals:   09/29/15 0559 09/29/15 0615  BP: 181/110 174/106  Pulse: 79   Temp: 36.8 C   Resp: 20     Last Pain:  Filed Vitals:   09/29/15 0617  PainSc: 2          Complications: No apparent anesthesia complications

## 2015-09-29 NOTE — Interval H&P Note (Signed)
History and Physical Interval Note:  09/29/2015 7:00 AM  [place]  has presented today for surgery, with the diagnosis of Cancer right  BREAST  The various methods of treatment have been discussed with the patient and family. After consideration of risks, benefits and other options for treatment, the patient has consented to  Procedure(s): RIGHT TOTAL  MASTECTOMY WITH AXILLARY SENTINEL NODE BIOPSY (Right) INSERTION PORT-A-CATH (N/A) BREAST RECONSTRUCTION WITH PLACEMENT OF TISSUE EXPANDER AND FLEX HD (ACELLULAR HYDRATED DERMIS) (Right) as a surgical intervention .  The patient's history has been reviewed, patient examined, no change in status, stable for surgery.  I have reviewed the patient's chart and labs.  Questions were answered to the patient's satisfaction.     Wallace Going

## 2015-09-29 NOTE — H&P (Signed)
History of Present Illness Melanie Moss T. Rihaan Barrack MD; 09/22/2015 1:27 PM) Patient words: Pre op.  The patient is a 40 year old female who presents with breast cancer. She returns for a preoperative visit prior to planned right total mastectomy, axillary sentinel lymph node biopsy and placement of Port-A-Cath.  Her original presentation was as follows: She is a 40 YO premenopausal female referred by Dr. Curlene Dolphin for evaluation of recently diagnosed carcinoma of the right breast. she recently thought she felt a small mass near her right nipple. She had a previous mammogram several years ago. She presented and was referred to the breast center for further evaluation. screening mammogram was performed showing a possible mass in the right breast. Subsequent imaging included diagnostic mamogram showing persistent mass in the lower outer quadrant of the right breast. The palpable abnormality was felt likely to be a small cyst in the areolar skin. Utrasound was performed showing an 11 mm mass in the 8:00 position of the right breast 10 cm from the nipple and a retro-areolar mass measuring 8 mm at the 6:30 position. 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 MRI has subsequently been performed showing a large area of increased enhancement between the 2 biopsied areas as well as 2 additional suspicious small masses, one at the nipple and one centrally in the breast. Also noted were abnormal axillary lymph nodes. She is scheduled for second look ultrasound of her axilla with biopsy.She is seen now in breast multidisciplinary clinic for initial treatment planning. She has experienced a breast lump as above but no skin changes or nipple discharge or inversion. She does not have a personal history of any previous breast  problems.  Findings at that time were the following: Tumor size: 1.1 cm and 0.8 cm plus non-biopsy large area of enhancement associated as well as 2 additional masses seen on MRI Tumor grade: 3 Estrogen Receptor: positive Progesterone Receptor: positive Her-2 neu: positive Lymph node status: positive  She subsequently has had ultrasound-guided core biopsy of an enlarged right axillary lymph node which was negative for malignancy. We had planned total mastectomy based on 2 separate breast cancers and therefore did not evaluate the additional enhancement seen on MRI. She has seen Dr. Marla Roe and tissue expander reconstruction is planned.  She has been having some soreness around her central right breast but no other symptoms.  Past Medical History  Diagnosis Date  . Preterm labor   . Anemia   . Hyperlipidemia   . Gestational diabetes   . Breast cancer of lower-outer quadrant of right female breast ([place]) 07/29/2015  . Family history of breast cancer   . Family history of colon cancer   . Family history of prostate cancer   . Hypertension     not currently on medication  . Anxiety   . GERD (gastroesophageal reflux disease)     tums    Past Surgical History  Procedure Laterality Date  . Dilation and curettage of uterus    . Cervical cerclage  02/19/2011    Procedure: CERCLAGE CERVICAL;  Surgeon: Agnes Lawrence, MD;  Location: [facility];  Service: Gynecology;  Laterality: N/A;     Allergies Patsey Berthold, [place]; 09/22/2015 12:31 PM) No Known Drug Allergies06/04/2015  Medication History Patsey Berthold, CMA; 09/22/2015 12:32 PM) Hydrocodone-Acetaminophen (5-'325MG'$  Tablet, Oral as needed) Active.  No Current Medications (Taken starting 09/22/2015) Ferrous Sulfate (325 (65 Fe)MG Tablet, Oral) Active. Ondansetron ('4MG'$  Tablet Disperse, Oral as needed) Active. B Complex 50 (Oral) Active. Multivitamin Adult (Oral) Active. Medications Reconciled  Vitals Patsey Berthold CMA;  09/22/2015 12:32 PM) 09/22/2015 12:32 PM Weight: 143.8 lb Height: 64in Height was reported by patient. Body Surface Area: 1.7 m Body Mass Index: 24.68 kg/m  Temp.: 98.57F  Pulse: 97 (Regular)  BP: 132/68 (Sitting, Left Arm, Standard)       Physical Exam Melanie Moss T. Jacelyn Cuen MD; 09/22/2015 1:28 PM) The physical exam findings are as follows: Note:General: Alert, well-developed and well nourished African-American female, in no distress Skin: Warm and dry without rash or infection. HEENT: No palpable masses or thyromegaly. Sclera nonicteric. Lymph nodes: No cervical, supraclavicular, nodes palpable. Breasts: No discrete palpable masses in either breast. She does have at least a couple of palpable right axillary lymph nodes. Lungs: Breath sounds clear and equal. No wheezing or increased work of breathing. Cardiovascular: Regular rate and rhythm without murmer. No JVD or edema. Peripheral pulses intact. No carotid bruits. Extremities: No edema or joint swelling or deformity. No chronic venous stasis changes. Neurologic: Alert and fully oriented. Gait normal. No focal weakness. Psychiatric: Normal mood and affect. Thought content appropriate with normal judgement and insight    Assessment & Plan Melanie Moss T. Misti Towle MD; 09/22/2015 1:32 PM) PRIMARY CANCER OF LOWER-OUTER QUADRANT OF RIGHT BREAST (C50.511) Impression: 40 year old premenopausal woman with new diagnosis of multicentric cancer of the right breast. She has evidence of extensive multicentric disease on imaging to remotely separated small masses at the 8:00 position and at the 6:30 position subareolar with biopsy showing 2 morphologically distinct cancers, one invasive ductal carcinoma grade 3, triple positive and 1 invasive lobular carcinoma, grade 1-2, ER PR positive and HER-2 negative. In addition she has extensive suspicious enhancement on MRI as well as 2 other suspicious masses centrally and at the nipple. Also  suspicious-appearing axillary lymph nodes on MRI. Second look ultrasound and lymph node biopsy was negative for malignancy.. Genetics has been performed and no genetic abnormalities detected.  We have had previously discussed need for total mastectomy. She has seen Dr. Marla Roe and implant based reconstruction is planned. We have discussed her negative lymph node biopsy and we will plan sentinel lymph node biopsy at the time of mastectomy. I remain somewhat concerned about her palpable lymph nodes and I discussed that I would remove any grossly abnormal lymph nodes at the time of her biopsy. Port-A-Cath placement is planned and we discussed this procedure in detail including the indications and risks of pneumothorax, bleeding, infection and long-term risks of thrombosis and malfunction. The breast procedure was discussed in detail and all her questions answered. Current Plans Schedule for Surgery  Right total mastectomy with immediate reconstruction, right axillary sentinel lymph node biopsy and placement of Port-A-Cath

## 2015-09-29 NOTE — Op Note (Signed)
Preoperative Diagnosis: Cancer right breast  Postoprative Diagnosis: Cancer right breast  Procedure: Procedure(s): RIGHT Modified  MASTECTOMY Following AXILLARY SENTINEL NODE BIOPSY INSERTION PORT-A-CATH    Surgeon: Excell Seltzer T   Assistants: Audelia Hives  Anesthesia:  General endotracheal anesthesia  Indications: Patient has a recent diagnosis of multi centric invasive cancer of the right breast with 2 biopsy proven masses, one ER/PR positive HER-2/neu positive  Invasive ductal cancer and a second ER/PR positive HER-2/neu negative invasive lobular cancer.  She also has another large area of enhancement between the 2 masses on MRI and 2 other small suspicious masses which have not been biopsied  Due to need for total mastectomy. She has enlarged axillary lymph nodes but preoperative ultasound biopsy was negative. We plan to proceed with Right total mastectomy and axillary sentinel lymph node biopsy. I discussed with the patient as she has clinically positive nodes despite a negative biopsy that I would proceed with axillary dissection if frozen section indicated metastatic disease to the lymph nodes. Port-A-Cath placement is also planned. We discussed all these procedures and indications in detailincluding risks  All documented elsewhere.    Procedure Detail: Preoperatively in the holding area the patient underwent injection of 1 mCi of technetium sulfur colloid intradermally around the right nipple. She was brought to the operating room, placed in the supine position on the operating table, and general endotracheal anesthesia induced. She was positioned with her right arm extended and left arm tucked with a roll behind her shoulder blades and the entire anterior chest and upper abdomen, right axilla, upper arms and neck were widely sterilely prepped and draped. PAS were in place. She received preoperative IV antibiotics. Patient timeout was performed and correct procedure verified. A  transversely oriented elliptical incision, skin sparing, encompassing the nipple areola complex was used. Skin and subcutaneous flaps were then raised superiorly toward the clavicle, medially to the edge of the sternum, inferiorly toward the inframammary crease and laterally out to the anterior border of the latissimus dorsi which was dissected and defined. The flaps were very thin particularly inferiorly as she has very minimal subcutaneous fat. The flaps appeared healthy. The dissection was deepened down onto the chest wall  In all these areas. Before reflecting the breastthe axilla was exposed and using the neoprobe for guidance I removed 3 lymph nodes with increased counts which also were somewhat firm and enlarged.  These were sent for frozen section.  While awaiting these results the breast was dissected off the chest wall working  Medial to lateral using the plasma blade. The specimen was reflected off the pectoralis major and off the serratus inferolaterally and the lateral border of the pectoralis dissected. The specimen was dissected off the inferior portion of the latissimus and off the serratus isolating the specimen up to the axilla. At this point I was able to identify 3 more sentinel lymph nodes with elevated counts, also to these  Somewhat firm as well.  These were sent as sentinel lymph nodes for a total of 6 nodes. The specimen was divided across the low axilla with Kelly clamps and tied with 2-0 Vicryl and the specimen removed and oriented and sent for permanent section. Dr. Marla Roe proceeded with reconstruction while I went to the other side for the Port-A-Cath placement.  With the patient in Trendelenburg position the left subclavian vein was cannulated with a needle and guidewire without difficulty and the guidewire confirmed in the vena cava on fluoroscopy. Following this the introducer was passed over  the guidewire which was removed and the flushed catheter placed via the introducer which  was stripped away.  The catheter was positioned with its tip in the superior vena cava atrial junction confirmed on fluoroscopy. A smallincision in the anterior chest wall was made and subcutaneous cutaneous pocket created. The catheter was tunneled subcutaneously to the pocket and trimmed to length and attached to the port which was sutured into the pocket with interrupted 2-0 Prolene. Fluoroscopy again showed good position of the catheter. Theincisions were closed with subcutaneous cutaneous and subcuticular 5-0 Monocryl and Liquiban. The catheter flushed and aspirated easily and was left flushed with concentrated heparin solution. At this point frozen section returned on one of the lymph nodes, the initial one examined, as positive for metastatic cancer. As planned then proceeded with axillary dissection. The  Right axilla was widely exposed through the incision and the pectoralis major retracted medially. Beginning beneath the pectoralis minor all fibrofatty tissue inferior to the axillary vein which was identified early in the dissectionwas swept laterally and inferiorly. Perforating branches of the axillary artery and vein were divided between clips. The second intercostal nerve was divided between clips. As the dissection progressed laterally I clearly identified the long thoracic nerve of Bell and the thoracodorsal vessels and nerve which were carefully protected throughout the remainder of the dissection. All fibrofatty tissue between these structures and some over the  Thoracodorsal vessels was swept inferiorly taking everything down to the subscapularis. All this tissue was then swept inferiorly and final attachments along the serratus and anterior border of latissimus were divided and the specimen removed. Both nerves were intact and functioning. There was no bleeding. A closed suction 19 Blake drain was left through a separate stab wound in the axilla. Dr. Marla Roe then continued with completion and  closure of her reconstruction.    Findings: As above  Estimated Blood Loss:  less than 100 mL         Drains: 19 round Blake drain in axilla  plus drains per Dr. Marla Roe  Blood Given: none          Specimens: #1 right total mastectomy   #2 right axillary sentinel lymph nodes X 6     #3 completion axillary dissection        Complications:  * No complications entered in OR log *         Disposition: PACU - hemodynamically stable.         Condition: stable

## 2015-09-29 NOTE — Brief Op Note (Signed)
09/29/2015  7:54 AM  PATIENT:  Rockne Coons  40 y.o. female  PRE-OPERATIVE DIAGNOSIS:  Cancer right  BREAST  POST-OPERATIVE DIAGNOSIS:  Cancer right  BREAST  PROCEDURE:  Procedure(s): RIGHT TOTAL  MASTECTOMY WITH AXILLARY SENTINEL NODE BIOPSY (Right) INSERTION PORT-A-CATH (N/A) BREAST RECONSTRUCTION WITH PLACEMENT OF TISSUE EXPANDER AND FLEX HD (ACELLULAR HYDRATED DERMIS) (Right)  SURGEON:  Surgeon(s) and Role: Panel 1:    * Excell Seltzer, MD - Primary  Panel 2:    * Loel Lofty Kato Wieczorek, DO - Primary  PHYSICIAN ASSISTANT: Shawn Rayburn, PA  ASSISTANTS: none   ANESTHESIA:   general  EBL:     BLOOD ADMINISTERED:none  DRAINS: (1) Jackson-Pratt drain(s) with closed bulb suction in the breast pocket   LOCAL MEDICATIONS USED:  NONE  SPECIMEN:  No Specimen  DISPOSITION OF SPECIMEN:  N/A  COUNTS:  YES  TOURNIQUET:  * No tourniquets in log *  DICTATION: .Dragon Dictation  PLAN OF CARE: Admit  PATIENT DISPOSITION:  PACU - hemodynamically stable.   Delay start of Pharmacological VTE agent (>24hrs) due to surgical blood loss or risk of bleeding: no

## 2015-09-30 ENCOUNTER — Encounter (HOSPITAL_COMMUNITY): Payer: Self-pay | Admitting: General Practice

## 2015-09-30 MED ORDER — SENNA 8.6 MG PO TABS: 1.0000 | ORAL_TABLET | Freq: Two times a day (BID) | ORAL | Status: DC

## 2015-09-30 MED ORDER — POLYETHYLENE GLYCOL 3350 17 G PO PACK: 17.0000 g | PACK | Freq: Every day | ORAL | Status: DC | PRN

## 2015-09-30 MED ORDER — DIPHENHYDRAMINE HCL 12.5 MG/5ML PO ELIX: 12.5000 mg | ORAL_SOLUTION | Freq: Four times a day (QID) | ORAL | Status: DC | PRN

## 2015-09-30 NOTE — Discharge Instructions (Signed)
Wear breast binder or sports bra at all times except for hygiene  No lifting with right arm, but you may move the right arm as tolerated.   Record drainage from your drains and bring the record to the office visits.

## 2015-09-30 NOTE — Discharge Summary (Signed)
Physician Discharge Summary  Patient ID: Melanie Moss MRN: [id number] DOB/AGE: 1976/03/18 40 y.o.  Admit date: 09/29/2015 Discharge date: 09/30/2015  Admission Diagnoses: Breast cancer of lower-outer quadrant of the right breast  Discharge Diagnoses:  Active Problems:   Breast cancer [facility])   Discharged Condition: good  Hospital Course: The patient is a 40 year old female who presents with breast cancer. She is admitted for planned right total mastectomy, axillary sentinel lymph node biopsy and placement of Port-A-Cath. Presentation history:  She is a 40 YO premenopausal female referred by Dr. Curlene Dolphin for evaluation of recently diagnosed carcinoma of the right breast. she recently thought she felt a small mass near her right nipple. She had a previous mammogram several years ago. She presented and was referred to the breast center for further evaluation.  screening mammogram was performed showing a possible mass in the right breast.  Subsequent imaging included diagnostic mamogram showing persistent mass in the lower outer quadrant of the right breast.  The palpable abnormality was felt likely to be a small cyst in the areolar skin. Utrasound was performed showing an 11 mm mass in the 8:00 position of the right breast 10 cm from the nipple and a retro-areolar mass  measuring 8 mm at the 6:30 position.   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 MRI has subsequently been performed showing a large area of increased enhancement between the 2 biopsied areas as well as 2 additional suspicious small masses, one at the nipple and one centrally in the breast. Also noted were abnormal axillary lymph nodes. She is scheduled for second look ultrasound of her axilla with biopsy.She is seen now in breast multidisciplinary  clinic for initial treatment planning.  She has experienced a breast lump as above but no skin changes or nipple discharge or inversion.  She does not have a personal history of any previous breast problems.  Findings at that time were the following: Tumor size: 1.1 cm and 0.8 cm plus non-biopsy large area of enhancement associated as well as 2 additional masses seen on MRI Tumor grade: 3 Estrogen Receptor: positive Progesterone Receptor: positive Her-2 neu: positive Lymph node status: positive She subsequently has had ultrasound-guided core biopsy of an enlarged right axillary lymph node which was negative for malignancy.  We had planned total mastectomy based on 2 separate breast cancers and therefore did not evaluate the additional enhancement seen on MRI.     She underwent right modified mastectomy and axillary sentinel node biopsy and insertion of a port-a-cath. The initial nodes were positive for metastatic breast cancer and she underwent a right axillary dissection. She had immediate reconstruction with placement of right breast tissue expander and ADM. She did well following the procedures and is medically stable and ready for discharge home with her husband.   Significant Diagnostic Studies: Final pathology pending  Treatments: surgery: Procedure: Procedure(s): RIGHT Modified  MASTECTOMY Following AXILLARY SENTINEL NODE BIOPSY INSERTION PORT-A-CATH  IMPLANTS: Right - [place] 350 cc expander with 150 cc of normal saline placed Acellular Dermal Matrix 4 x 16 cm   Discharge Exam: Blood pressure 134/71, pulse 75, temperature 98.1 F (36.7 C), temperature source Oral, resp. rate 18, height _0  (1.6 m), weight 69.5 kg (153 lb 3.5 oz), last menstrual period 09/21/2015, SpO2 100 %. General appearance: alert, cooperative, appears  stated age and no distress Resp: clear to auscultation bilaterally Cardio: regular rate and rhythm the right breast flap is viable and without signs of  hematoma or infection. the JP drains are serosanguinous.   Disposition: 01-Home or Self Care  Discharge Instructions    Care order/instruction    Complete by:  As directed   Please instruct patient and husband how to empty the JP drains and sent home with sheets to record volumes            Medication List    TAKE these medications        cephALEXin 500 MG capsule  Commonly known as:  KEFLEX  Take 500 mg by mouth 4 (four) times daily.     diazepam 2 MG tablet  Commonly known as:  VALIUM  Take 2 mg by mouth every 6 (six) hours as needed for anxiety.     diphenhydrAMINE 12.5 MG/5ML elixir  Commonly known as:  BENADRYL  Take 5 mLs (12.5 mg total) by mouth every 6 (six) hours as needed for itching.     ferrous sulfate 325 (65 FE) MG tablet  Take 325 mg by mouth 2 (two) times daily with a meal.     HYDROcodone-acetaminophen 5-325 MG tablet  Commonly known as:  NORCO/VICODIN  Take 1 tablet by mouth every 6 (six) hours as needed for severe pain.     multivitamin tablet  Take 1 tablet by mouth daily.     ondansetron 4 MG disintegrating tablet  Commonly known as:  ZOFRAN-ODT  Take 4 mg by mouth every 8 (eight) hours as needed for nausea or vomiting.     polyethylene glycol packet  Commonly known as:  MIRALAX / GLYCOLAX  Take 17 g by mouth daily as needed for mild constipation.     senna 8.6 MG Tabs tablet  Commonly known as:  SENOKOT  Take 1 tablet (8.6 mg total) by mouth 2 (two) times daily.     SUPER B COMPLEX PO  Take 1 tablet by mouth daily.           Follow-up Information    Follow up with Wallace Going, DO In 1 week.   Specialty:  Plastic Surgery   Contact information:   [place] [address] [phone]       Follow up with Edward Jolly, MD In 2 weeks.   Specialty:  General Surgery   Contact information:   [place] [address] [phone]       Signed: [facility] [phone]

## 2015-09-30 NOTE — Progress Notes (Signed)
Pt. Stated what she received on the tray its not what she order but it was ok

## 2015-09-30 NOTE — Progress Notes (Signed)
1 Day Post-Op  Subjective: Patient reports she is doing well this am. She has been up to the bathroom without problems and her  pain is under good control.  Objective: Vital signs in last 24 hours: Temp:  [97.6 F (36.4 C)-98.5 F (36.9 C)] 98.1 F (36.7 C) (06/09 0522) Pulse Rate:  [58-93] 75 (06/09 0522) Resp:  [16-23] 18 (06/09 0522) BP: (134-161)/(71-99) 134/71 mmHg (06/09 0522) SpO2:  [98 %-100 %] 100 % (06/09 0522) Weight:  [69.5 kg (153 lb 3.5 oz)] 69.5 kg (153 lb 3.5 oz) (06/08 1345)    Intake/Output from previous day: 06/08 0701 - 06/09 0700 In: 4135 [P.O.:360; I.V.:3625; IV Piggyback:150] Out: [id number] [Urine:1235; Drains:260; Blood:150] Intake/Output this shift:    General appearance: alert, cooperative and no distress the right breast flap is viable and without signs of hematoma or infection. the JP drains are draining serosanguinous drainage  Lab Results:   Recent Labs  09/29/15 1429  WBC 14.8*  HGB 11.0*  HCT 35.0*  PLT 312   BMET  Recent Labs  09/29/15 1429  NA 133*  K 3.6  CL 101  CO2 23  GLUCOSE 203*  BUN 6  CREATININE 0.73  CALCIUM 8.6*   PT/INR No results for input(s): LABPROT, INR in the last 72 hours. ABG No results for input(s): PHART, HCO3 in the last 72 hours.  Invalid input(s): PCO2, PO2  Studies/Results: Nm Sentinel Node Inj-no Rpt (breast)  09/29/2015  CLINICAL DATA: Cancer right breas Sulfur colloid was injected intradermally by the nuclear medicine technologist for breast cancer sentinel node localization.   Dg Chest Port 1 View  09/29/2015  CLINICAL DATA:  Status post Port-A-Cath placement. EXAM: PORTABLE CHEST 1 VIEW COMPARISON:  None FINDINGS: The patient is status post right mastectomy with tissue expander placement. A left Port-A-Cath terminates in the mid to central SVC. No pneumothorax. No other acute abnormalities identified. Surgical clips in the left axilla consistent with axillary dissection. IMPRESSION: Appropriate  placement of left Port-A-Cath. No pneumothorax. Postsurgical changes on the right. Electronically Signed   By: Dorise Bullion III M.D   On: 09/29/2015 12:26   Dg Cyndy Freeze Guide Cv Line-no Report  09/29/2015  CLINICAL DATA:  FLOURO GUIDE CV LINE Fluoroscopy was utilized by the requesting physician.  No radiographic interpretation.    Anti-infectives: Anti-infectives    Start     Dose/Rate Route Frequency Ordered Stop   09/29/15 1400  ceFAZolin (ANCEF) IVPB 1 g/50 mL premix  Status:  Discontinued     1 g 100 mL/hr over 30 Minutes Intravenous Every 8 hours 09/29/15 1311 09/30/15 0755   09/29/15 0828  polymyxin B 500,000 Units, bacitracin 50,000 Units in sodium chloride irrigation 0.9 % 500 mL irrigation  Status:  Discontinued       As needed 09/29/15 0829 09/29/15 1114   09/29/15 0549  ceFAZolin (ANCEF) IVPB 2g/100 mL premix     2 g 200 mL/hr over 30 Minutes Intravenous On call to O.R. 09/29/15 0549 09/29/15 0759   09/29/15 0538  ceFAZolin (ANCEF) 2-4 GM/100ML-% IVPB    Comments:  Sammuel Cooper   : cabinet override      09/29/15 0538 09/29/15 1744      Assessment/Plan: s/p Procedure(s): RIGHT TOTAL  MASTECTOMY WITH AXILLARY SENTINEL NODE BIOPSY (Right) INSERTION PORT-A-CATH (N/A) BREAST RECONSTRUCTION WITH PLACEMENT OF TISSUE EXPANDER AND FLEX HD (ACELLULAR HYDRATED DERMIS) (Right) AXILLARY LYMPH NODE DISSECTION (Right) Discharge  Doing well following right mastectomy and axillary lymph node dissection. Ready to discharge  home today and will follow up in the office next week.   LOS: 1 day   [facility] Plastic Surgery [phone]

## 2015-09-30 NOTE — Progress Notes (Signed)
Discussed discharge summary with patient. Reviewed all medications with patient. Patient received Rx. Patient and husband educated on JP drain management at home. Documentation provided for patient. Patient ready for discharge.

## 2015-09-30 NOTE — Anesthesia Postprocedure Evaluation (Signed)
Anesthesia Post Note  Patient: FRONIA HAWTHORNE  Procedure(s) Performed: Procedure(s) (LRB): RIGHT TOTAL  MASTECTOMY WITH AXILLARY SENTINEL NODE BIOPSY (Right) INSERTION PORT-A-CATH (N/A) BREAST RECONSTRUCTION WITH PLACEMENT OF TISSUE EXPANDER AND FLEX HD (ACELLULAR HYDRATED DERMIS) (Right) AXILLARY LYMPH NODE DISSECTION (Right)  Patient location during evaluation: PACU Anesthesia Type: General and Regional Level of consciousness: awake Pain management: pain level controlled Vital Signs Assessment: post-procedure vital signs reviewed and stable Respiratory status: spontaneous breathing Cardiovascular status: stable Postop Assessment: no signs of nausea or vomiting Anesthetic complications: no    Last Vitals:  Filed Vitals:   09/29/15 1900 09/30/15 0522  BP: 144/84 134/71  Pulse: 93 75  Temp: 36.7 C 36.7 C  Resp: 18 18    Last Pain:  Filed Vitals:   09/30/15 0943  PainSc: 0-No pain                 Darian Ace

## 2015-10-10 ENCOUNTER — Encounter (HOSPITAL_COMMUNITY): Payer: Self-pay | Admitting: General Surgery

## 2015-10-17 ENCOUNTER — Encounter: Payer: Self-pay | Admitting: Hematology

## 2015-10-17 ENCOUNTER — Ambulatory Visit (HOSPITAL_BASED_OUTPATIENT_CLINIC_OR_DEPARTMENT_OTHER): Payer: BLUE CROSS/BLUE SHIELD | Admitting: Hematology

## 2015-10-17 ENCOUNTER — Telehealth: Payer: Self-pay | Admitting: *Deleted

## 2015-10-17 ENCOUNTER — Telehealth: Payer: Self-pay | Admitting: Hematology

## 2015-10-17 ENCOUNTER — Other Ambulatory Visit: Payer: BLUE CROSS/BLUE SHIELD

## 2015-10-17 VITALS — BP 148/93 | HR 87 | Temp 98.9°F | Resp 18 | Ht 63.0 in | Wt 144.6 lb

## 2015-10-17 DIAGNOSIS — D509 Iron deficiency anemia, unspecified: Secondary | ICD-10-CM | POA: Diagnosis not present

## 2015-10-17 DIAGNOSIS — Z170 Estrogen receptor positive status [ER+]: Secondary | ICD-10-CM

## 2015-10-17 DIAGNOSIS — C773 Secondary and unspecified malignant neoplasm of axilla and upper limb lymph nodes: Secondary | ICD-10-CM

## 2015-10-17 DIAGNOSIS — C50511 Malignant neoplasm of lower-outer quadrant of right female breast: Secondary | ICD-10-CM

## 2015-10-17 MED ORDER — PROCHLORPERAZINE MALEATE 10 MG PO TABS: 10.0000 mg | ORAL_TABLET | Freq: Four times a day (QID) | ORAL | Status: DC | PRN

## 2015-10-17 MED ORDER — ONDANSETRON HCL 8 MG PO TABS: 8.0000 mg | ORAL_TABLET | Freq: Two times a day (BID) | ORAL | Status: DC | PRN

## 2015-10-17 MED ORDER — LIDOCAINE-PRILOCAINE 2.5-2.5 % EX CREA: TOPICAL_CREAM | CUTANEOUS | Status: DC

## 2015-10-17 NOTE — Telephone Encounter (Signed)
spoke w/ pt confirmed apt dates & times °

## 2015-10-17 NOTE — Progress Notes (Signed)
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 breast [facility])   Staging form: Breast, AJCC 7th Edition     Clinical stage from 08/03/2015: Stage IA (T1c, N0, M0) - Unsigned     Pathologic stage from 09/29/2015: Stage IIIA (T3(m), N1a, cM0) - Signed by Truitt Merle, MD on 10/17/2015         Breast cancer of lower-outer quadrant of right female breast ([place])   07/25/2015 Mammogram diagnostic mammogram and ultrasound showed a 11 mm mass in the 8:00 position of the right breast, small irregular lesion in the retroareolar regio of the right breast, contiguous with the dermis   07/26/2015 Initial Diagnosis Breast cancer of lower-outer quadrant of right female breast ([place])   07/26/2015 Initial Biopsy right breast needle core biopsy at 8:00 position (1), invasive ductal carcinoma, grade 3, 6:30 o'clock position invasive (2) and in situ ductal carcinoma, grade 1-2, lymphovascular invasion is identified   07/26/2015 Receptors her2 (1) ER 95% positive, PR 95% positive, Ki-67 30%, HER-2 positive with ratio 2.04, copy # 5.20, (2) ER 90% positive, PR 95% positive, Ki-67 10%, HER-2 negative.   08/02/2015 Imaging breast MRI showed extensive mass  and non-mass enhancement in the lower outer quadrant of the right breast  spanning 13 cm, focal enhancement within the right nipple, at least 3-4 abnormal appearing right axillary lymph node.   09/29/2015 Surgery right mastectomy and axillar node dissection    09/29/2015 Pathology Results Right mastectomy and axillary node dissection showed multifocal mammary carcinoma with ductal and lobular  features, tumor size 13 cm, tubal 1 cm, 1.9 cm, 2.5 cm, grade 2-3, anterior margins were positive, 2 nodes with metastasis and one with isolated cells    HISTORY OF PRESENTING ILLNESS (08/03/2015):  Sarin SHERNELL SALDIERNA 40 y.o. female is here because of Her newly diagnosed right breast cancer. She is accompanied by her fianc, sister and mother to our multidisciplinary breast clinic today.  She felt a breast lump and noticed mild intermittent breast pain 2 weeks ago, no nipple discharge or skin change. She also has moderate fatigue and dyspnea on moderate to heavy exertion for a few months, no weight loss, no other pain or other symptoms.  She had normal mammgram 6 years ago, for a small lump which felt to be benign and no biopsy was done.    She has iregular period, twice a month, it lasts about 5-7 days, heavy for 3 days, she chanes 6-7 times a day. She was found to be anemic recently, and has started taking oral iron pill a few weeks ago, tolerates well.  GYN HISTORY  Menarchal: 10  LMP: 07/29/2015 Contraceptive: no  HRT: n/a  G4P1: 3 miscarrage, one 52 yo son, no plan to have more children   CURRENT THERAPY: pending chemo TCHP   INTERIM HISTORY: Veroncia returns for follow-up. She underwent right mastectomy and axillary node dissection on 09/29/2015. She tolerated surgery well, has been recovering well. She still has some tightness and discomfort in the right upper arm, no significant arm swelling, or limited range of motion. She has mild constipation, she takes MiraLAX and senna,. Her appetite and energy level has  recovered very well, she is otherwise doing well.   MEDICAL HISTORY:  Past Medical History  Diagnosis Date  . Preterm labor   . Anemia   . Hyperlipidemia   . Gestational diabetes   . Breast cancer of lower-outer quadrant of right female breast ([place]) 07/29/2015  . Family history of breast cancer   . Family history of colon cancer   . Family history of prostate cancer   .  Hypertension     not currently on medication  . Anxiety   . GERD (gastroesophageal reflux disease)     tums     SURGICAL HISTORY: Past Surgical History  Procedure Laterality Date  . Dilation and curettage of uterus    . Cervical cerclage  02/19/2011    Procedure: CERCLAGE CERVICAL;  Surgeon: Agnes Lawrence, MD;  Location: [facility];  Service: Gynecology;  Laterality: N/A;  . Mastectomy with axillary lymph node dissection Right 09/29/2015  . Simple mastectomy with axillary sentinel node biopsy Right 09/29/2015    Procedure: RIGHT TOTAL  MASTECTOMY WITH AXILLARY SENTINEL NODE BIOPSY;  Surgeon: Excell Seltzer, MD;  Location: [place];  Service: General;  Laterality: Right;  . Portacath placement N/A 09/29/2015    Procedure: INSERTION PORT-A-CATH;  Surgeon: Excell Seltzer, MD;  Location: [place];  Service: General;  Laterality: N/A;  . Axillary lymph node dissection Right 09/29/2015    Procedure: AXILLARY LYMPH NODE DISSECTION;  Surgeon: Excell Seltzer, MD;  Location: [place];  Service: General;  Laterality: Right;  . Breast reconstruction with placement of tissue expander and flex hd (acellular hydrated dermis) Right 09/29/2015    Procedure: BREAST RECONSTRUCTION WITH PLACEMENT OF TISSUE EXPANDER AND FLEX HD (ACELLULAR HYDRATED DERMIS);  Surgeon: Loel Lofty Dillingham, DO;  Location: [place];  Service: Plastics;  Laterality: Right;    SOCIAL HISTORY: Social History   Social History  . Marital Status: Single    Spouse Name: N/A  . Number of Children: 1  . Years of Education: N/A   Occupational History  . Not on file.   Social History Main Topics  . Smoking status: Never Smoker   . Smokeless tobacco: Never Used  . Alcohol Use: No  . Drug Use: No  . Sexual Activity: Yes   Other Topics Concern  . Not on file   Social History Narrative    FAMILY HISTORY: Family History  Problem Relation Age of Onset  . Hypertension Mother   . Hypertension Father   . Prostate cancer Father 110    . Breast cancer Maternal Aunt 73  . Breast cancer Paternal Aunt 45  . Cirrhosis Maternal Uncle   . Prostate cancer Paternal Uncle   . Throat cancer Maternal Aunt     non smoker  . Prostate cancer Paternal Uncle   . Colon cancer Cousin     maternal first cousin dx in his 31s  . Lung cancer Cousin     smoker    ALLERGIES:  has No Known Allergies.  MEDICATIONS:  Current Outpatient Prescriptions  Medication Sig Dispense Refill  . B Complex-C (SUPER B COMPLEX PO) Take 1 tablet by mouth daily.    . cephALEXin (KEFLEX) 500 MG capsule Take 500 mg by mouth 4 (four) times daily.  0  . diazepam (VALIUM) 2 MG tablet Take 2 mg by mouth every 6 (six) hours as needed for anxiety.   0  . diphenhydrAMINE (BENADRYL) 12.5 MG/5ML elixir Take 5 mLs (12.5 mg total) by mouth every 6 (six) hours as  needed for itching. 120 mL 0  . ferrous sulfate 325 (65 FE) MG tablet Take 325 mg by mouth 2 (two) times daily with a meal.    . HYDROcodone-acetaminophen (NORCO/VICODIN) 5-325 MG tablet Take 1 tablet by mouth every 6 (six) hours as needed for severe pain.   0  . Multiple Vitamin (MULTIVITAMIN) tablet Take 1 tablet by mouth daily.    . ondansetron (ZOFRAN-ODT) 4 MG disintegrating tablet Take 4 mg by mouth every 8 (eight) hours as needed for nausea or vomiting.   0  . polyethylene glycol (MIRALAX / GLYCOLAX) packet Take 17 g by mouth daily as needed for mild constipation. 14 each 0  . senna (SENOKOT) 8.6 MG TABS tablet Take 1 tablet (8.6 mg total) by mouth 2 (two) times daily. 120 each 0   No current facility-administered medications for this visit.    REVIEW OF SYSTEMS:   Constitutional: Denies fevers, chills or abnormal night sweats Eyes: Denies blurriness of vision, double vision or watery eyes Ears, nose, mouth, throat, and face: Denies mucositis or sore throat Respiratory: Denies cough, dyspnea or wheezes Cardiovascular: Denies palpitation, chest discomfort or lower extremity swelling Gastrointestinal:   Denies nausea, heartburn or change in bowel habits Skin: Denies abnormal skin rashes Lymphatics: Denies new lymphadenopathy or easy bruising Neurological:Denies numbness, tingling or new weaknesses Behavioral/Psych: Mood is stable, no new changes  All other systems were reviewed with the patient and are negative.  PHYSICAL EXAMINATION: ECOG PERFORMANCE STATUS: 1 - Symptomatic but completely ambulatory  Filed Vitals:   10/17/15 0927  BP: 148/93  Pulse: 87  Temp: 98.9 F (37.2 C)  Resp: 18   Filed Weights   10/17/15 0927  Weight: 144 lb 9.6 oz (65.59 kg)    GENERAL:alert, no distress and comfortable SKIN: skin color, texture, turgor are normal, no rashes or significant lesions EYES: normal, conjunctiva are pink and non-injected, sclera clear OROPHARYNX:no exudate, no erythema and lips, buccal mucosa, and tongue normal  NECK: supple, thyroid normal size, non-tender, without nodularity LYMPH:  no palpable lymphadenopathy in the cervical, axillary or inguinal LUNGS: clear to auscultation and percussion with normal breathing effort HEART: regular rate & rhythm and no murmurs and no lower extremity edema ABDOMEN:abdomen soft, non-tender and normal bowel sounds Musculoskeletal:no cyanosis of digits and no clubbing  PSYCH: alert & oriented x 3 with fluent speech NEURO: no focal motor/sensory deficits Breasts: Breast inspection showed them to be symmetrical with no skin change or nipple discharge.  Status post right mastectomy, surgical scar has healed well, no significant discharge or skin erythema. Palpation of the left breast and bilateral axilla showed no palpable mass.  LABORATORY DATA:  I have reviewed the data as listed CBC Latest Ref Rng 09/29/2015 09/21/2015 08/03/2015  WBC 4.0 - 10.5 K/uL 14.8(H) 6.3 7.0  Hemoglobin 12.0 - 15.0 g/dL 11.0(L) 12.6 7.8(L)  Hematocrit 36.0 - 46.0 % 35.0(L) 40.2 27.6(L)  Platelets 150 - 400 K/uL 312 298 508(H)    CMP Latest Ref Rng 09/29/2015  09/21/2015 08/03/2015  Glucose 65 - 99 mg/dL 203(H) 96 100  BUN 6 - 20 mg/dL 6 10 10.9  Creatinine 0.44 - 1.00 mg/dL 0.73 0.77 0.8  Sodium 135 - 145 mmol/L 133(L) 139 143  Potassium 3.5 - 5.1 mmol/L 3.6 3.8 4.0  Chloride 101 - 111 mmol/L 101 107 -  CO2 22 - 32 mmol/L _0 Calcium 8.9 - 10.3 mg/dL 8.6(L) 9.4 9.3  Total Protein 6.4 - 8.3 g/dL - - 7.6  Total Bilirubin 0.20 - 1.20 mg/dL - - 0.36  Alkaline Phos 40 - 150 U/L - - 53  AST 5 - 34 U/L - - 19  ALT 0 - 55 U/L - - 10    PATHOLOGY: Diagnosis 09/29/2015 1. Lymph node, sentinel, biopsy, Right axillary #1 - ONE BENIGN LYMPH NODE WITH NO TUMOR SEEN (0/1). 2. Lymph node, sentinel, biopsy, Right axillary #2 - ONE LYMPH NODE POSITIVE FOR METASTATIC MAMMARY CARCINOMA (1/1). 3. Lymph node, sentinel, biopsy, Right axillary #4 - ONE BENIGN LYMPH NODE WITH NO TUMOR SEEN (0/1). 4. 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 5. 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 - LARGEST FOCUS OF INVASIVE MAMMARY CARCINOMA IS GRADE 2, SHOWS ASSOCIATED LOBULAR CARCINOMA IN SITU AND MEASURES 13 CM IN GREATEST DIMENSION. - SECOND LARGEST FOCUS SHOWS INVASIVE GRADE 3 MAMMARY CARCINOMA AND SPANS 2.1 CM IN GREATEST DIMENSION. - THIRD LARGEST FOCUS SHOWS INVASIVE GRADE 2 MAMMARY CARCINOMA WITH LOBULAR CARCINOMA IN SITU AND MEASURES 1.9 CM IN GREATEST DIMENSION. - SMALLEST FOCUS SHOWS INVASIVE GRADE 3 MAMMARY CARCINOMA AND MEASURES 0.5 CM IN GREATEST DIMENSION. - ANTERIOR INFERIOR SOFT TISSUE MARGIN DEMONSTRATES BROAD POSITIVITY FOR INVASIVE MAMMARY CARCINOMA. - OTHER MARGINS ARE NEGATIVE. - SEE ONCOLOGY TEMPLATE. 6. Lymph node, sentinel, biopsy, Right axillary #3 - ONE BENIGN LYMPH NODE WITH NO TUMOR SEEN (0/1). 7. Lymph node, sentinel, biopsy, Right axillary #6 - ONE LYMPH NODE WITH ISOLATED  TUMOR CELLS. 8. Lymph nodes, regional resection, Right axillary contents - ELEVEN BENIGN LYMPH NODES WITH NO TUMOR SEEN (0/11). Specimen, including laterality and lymph node sampling (sentinel, non-sentinel): Right breast with right sentinel and non-sentinel lymph nodes. Procedure: Right modified mastectomy with sentinel lymph node biopsy and axillary dissection. Histologic type: Multifocal mammary carcinoma with ductal and lobular features. Largest focus (13 cm): Grade: 2. Tubule formation: 3. Nuclear pleomorphism: 2. Mitotic: 1. Second largest focus (2.1 cm): Grade: 3. Tubule formation: 3. Nuclear pleomorphism: 2. Mitotic: 3. Third largest focus (1.9 cm): Grade: 2. Tubule formation: 3. Nuclear pleomorphism: 2. Mitotic: 1. Smallest focus (0.5 cm): Grade: 3. Tubule formation: 3. Nuclear pleomorphism: 2. Mitotic: 3. Tumor size (gross measurements): 13 cm, 2.1 cm, 1.9 cm and 0.5 cm. Margins: Invasive, distance to closest margin: Invasive mammary carcinoma broadly involves the anterior inferior soft tissue margin. In-situ, distance to closest margin: Margin uninvolved by in situ carcinoma. If margin positive, focally or broadly: Broadly positive. Lymphovascular invasion: Yes, identified. Ductal carcinoma in situ: Not identified. Grade: Not applicable. Extensive intraductal component: Not applicable. Lobular neoplasia: Yes, lobular carcinoma in situ is present. Tumor focality: Multifocal. Treatment effect: Not applicable. If present, treatment effect in breast tissue, lymph nodes or both: Not applicable. Extent of tumor: Skin: Not involved. Nipple: Not involved. Skeletal muscle: Not received. Lymph nodes: Examined: 6 Sentinel. 11 Non-sentinel. 17 Total. Lymph nodes with metastasis: 2 with metastasis and 1 with isolated tumor cells. Isolated tumor cells (< 0.2 mm): 1. Micrometastasis: (> 0.2 mm and < 2.0 mm): 0. Macrometastasis: (> 2.0 mm): 2. Extracapsular extension:  Not identified.  Breast prognostic profile: Performed on previous case, [id number]. Right 8 o'clock biopsy 12 cm from the nipple: Estrogen receptor: 95%, positive. Progesterone receptor: 95%, positive. Her-2 neu: 2.04 ratio, positive. Ki-67: 30%. Right breast needle core biopsy 6:30 o'clock position 1 cm from the nipple: Estrogen receptor: 90%, positive. Progesterone receptor: 95%, positive. Her-2 neu: 1.39 ratio, negative.  Ki-67: 10%. Non-neoplastic breast: No significant non-neoplastic breast findings.  TNM: pT3(m), pN1a. Comments: A cytokeratin AE1/AE3 immunohistochemical stain is performed on eight blocks containing lymph node tissue (eight stains total). The morphology coupled with the staining pattern is consistent with the above findings. An E-cadherin immunohistochemical stain is also performed on four blocks from the right simple mastectomy specimen. The staining pattern coupled with the morphology is consistent with the above findings. As there was Her-2 neu positivity in one of the biopsies and both biopsies were positive for estrogen receptor, a repeat breast prognostic profile will not be performed unless otherwise requested. Dr. Lyndon Code has seen selected slides 5E, 40F, 5G, 5J, 5N, 5O, and 5P with agreement that the tumor is a mulitfocal invasive mammary carcinoma with a mixed ductal and lobular phenotype. Dr. Tresa Moore has also seen a selected slide (5P) with agreement of the positive anterior inferior margin. (RH:ecj 10/03/2015)   RADIOGRAPHIC STUDIES: I have personally reviewed the radiological images as listed and agreed with the findings in the report. No new scans  ASSESSMENT & PLAN:  40 year old African-American female, premenopausal, presented with a palpable right breast mass.  1. 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 Her initial biopsy showed 1 triple positive disease, the other was ER/PR positive and PR negative disease. -Given her stage III disease, I recommend staging CT chest, abdomen and pelvis with contrast, and bone scan to ruled out distant metastasis. -We discussed that HER-2 positive breast cancer are more aggressive, with a high risk for recurrence after surgery, especially in the setting of locally advanced stage. I recommend her to have adjuvant chemotherapy TCHP (docetaxel, carboplatin, Herceptin, pejeta) every 3 weeks for total of 6 cycles, followed by Herceptin maintenance therapy to complete 1 year treatment. -Chemotherapy consent: Side effects including but does not not limited to, fatigue, nausea, vomiting, diarrhea, hair loss, neuropathy, fluid retention, renal and kidney dysfunction, neutropenic fever, needed for blood transfusion, bleeding, cardiomyopathy, skin rash, were discussed with patient in great detail. She agrees to proceed. -She has had a port placed during her breast surgery -Her baseline echo was normal, which was done in April 2017, she will follow-up with cardiologist to monitor her heart during her and her to therapy -Given her strong ER and PR positive tumor, i also strongly recommend adjuvant antiestrogen therapy, which will start after she completes radiation. -Given her node-positive disease, she wouldn't need adjuvant radiation to decrease her risk of local recurrence.   2. Iron deficient anemia -Her lab tests showed a moderate anemia with hemoglobin 7.8, low MCV 62.7, her serum iron level and transferrin saturation on low, ferritin 25, this is consistent with iron deficient anemia, likely secondary to her menorrhagia. -Her anemia much improved after IV Feraheme. -I encouraged her to continue oral iron  pill  3. Genetic -Given her young age and positive family history, we strongly recommend her to [facility] genetic counseling for genetic testing to ruled out inheritable breast cancer syndrome. She agrees. -Her genetic testing was normal  Plan -Staging CT chest, abdomen and pelvis with contrast, and bone scan in the next week -She'll start adjuvant chemotherapy TCHP in 1-2 weeks after her staging scans. I'll see her back before her first cycle chemotherapy.  All  questions were answered. The patient and her family members agree with the above plan. The patient knows to call the clinic with any problems, questions or concerns.  I spent 30 minutes counseling the patient face to face. The total time spent in the appointment was 35 minutes and more than 50% was on counseling.     Truitt Merle, MD 10/17/2015 7:51 AM

## 2015-10-17 NOTE — Telephone Encounter (Signed)
Per staff message and POF I have scheduled appts. Advised scheduler of appts and moved appt to 7/6 . JMW

## 2015-10-19 ENCOUNTER — Encounter: Payer: Self-pay | Admitting: Hematology

## 2015-10-19 NOTE — Progress Notes (Signed)
forms left in pod °

## 2015-10-20 ENCOUNTER — Encounter: Payer: Self-pay | Admitting: Hematology

## 2015-10-20 ENCOUNTER — Encounter: Payer: Self-pay | Admitting: Physical Therapy

## 2015-10-20 ENCOUNTER — Ambulatory Visit: Payer: BLUE CROSS/BLUE SHIELD | Attending: Hematology | Admitting: Physical Therapy

## 2015-10-20 ENCOUNTER — Ambulatory Visit: Payer: BLUE CROSS/BLUE SHIELD | Admitting: Physical Therapy

## 2015-10-20 DIAGNOSIS — I972 Postmastectomy lymphedema syndrome: Secondary | ICD-10-CM | POA: Diagnosis present

## 2015-10-20 DIAGNOSIS — M25611 Stiffness of right shoulder, not elsewhere classified: Secondary | ICD-10-CM

## 2015-10-20 DIAGNOSIS — M25511 Pain in right shoulder: Secondary | ICD-10-CM

## 2015-10-20 NOTE — Progress Notes (Signed)
forms left in pod- left for dr Burr Medico to sign

## 2015-10-20 NOTE — Patient Instructions (Signed)
Shoulder: Abduction (Supine)    With right arm flat on floor, hold dowel in palm. Slowly move arm up to side of head by pushing with opposite arm. Do not let elbow bend. Hold 5____ seconds. Repeat __10__ times. Do _2___ sessions per day. CAUTION: Stretch slowly and gently.  Copyright  VHI. All rights reserved.  Shoulder: Flexion (Supine)    With hands shoulder width apart, slowly lower dowel to floor behind head. Do not let elbows bend. Keep back flat. Hold __5__ seconds. Repeat __10__ times. Do __2__ sessions per day. CAUTION: Stretch slowly and gently.  Copyright  VHI. All rights reserved.

## 2015-10-20 NOTE — Therapy (Signed)
[place], [place], [address] Phone: [phone]   Fax:  [phone]  Physical Therapy Evaluation  Patient Details  Name: Melanie Moss MRN: [id number] Date of Birth: July 19, 1975 Referring Provider: Burr Medico  Encounter Date: 10/20/2015      PT End of Session - 10/20/15 1145    Visit Number 1   Number of Visits 17   Date for PT Re-Evaluation 12/15/15   PT Start Time 1100   PT Stop Time 1143   PT Time Calculation (min) 43 min   Activity Tolerance Patient tolerated treatment well   Behavior During Therapy [facility] for tasks assessed/performed      Past Medical History  Diagnosis Date  . Preterm labor   . Anemia   . Hyperlipidemia   . Gestational diabetes   . Breast cancer of lower-outer quadrant of right female breast ([place]) 07/29/2015  . Family history of breast cancer   . Family history of colon cancer   . Family history of prostate cancer   . Hypertension     not currently on medication  . Anxiety   . GERD (gastroesophageal reflux disease)     tums     Past Surgical History  Procedure Laterality Date  . Dilation and curettage of uterus    . Cervical cerclage  02/19/2011    Procedure: CERCLAGE CERVICAL;  Surgeon: Agnes Lawrence, MD;  Location: [facility] [place];  Service: Gynecology;  Laterality: N/A;  . Mastectomy with axillary lymph node dissection Right 09/29/2015  . Simple mastectomy with axillary sentinel node biopsy Right 09/29/2015    Procedure: RIGHT TOTAL  MASTECTOMY WITH AXILLARY SENTINEL NODE BIOPSY;  Surgeon: Excell Seltzer, MD;  Location: [place];  Service: General;  Laterality: Right;  . Portacath placement N/A 09/29/2015    Procedure: INSERTION PORT-A-CATH;  Surgeon: Excell Seltzer, MD;  Location: [place];  Service: General;  Laterality: N/A;  . Axillary lymph node dissection Right 09/29/2015    Procedure: AXILLARY LYMPH NODE DISSECTION;  Surgeon: Excell Seltzer, MD;  Location: [place];   Service: General;  Laterality: Right;  . Breast reconstruction with placement of tissue expander and flex hd (acellular hydrated dermis) Right 09/29/2015    Procedure: BREAST RECONSTRUCTION WITH PLACEMENT OF TISSUE EXPANDER AND FLEX HD (ACELLULAR HYDRATED DERMIS);  Surgeon: Loel Lofty Dillingham, DO;  Location: [place];  Service: Plastics;  Laterality: Right;    There were no vitals filed for this visit.       Subjective Assessment - 10/20/15 1109    Subjective My right arm hurts. My range of motion is very limited especially when I try to raise my arm or push my arm back. I can not hand my son his juice in the backseat because I can not reach my hand that far back. I had a right side mastectomy and removal of all of my lymph nodes.    Pertinent History June 8th right side mastectomy and lymph node dissection, pt starts taking chemo next Friday, pt has to see plastic surgeon for implant tomorrow, pt currently has an expander on R, pt to begin radiation after chemotherapy, (1) ER 95% positive, PR 95% positive, Ki-67 30%, HER-2 positive with ratio 2.04, copy # 5.20, (2) ER 90% positive, PR 95% positive, Ki-67 10%, HER-2 negative.   Patient Stated Goals to get as strong as I can as quick as I can   Currently in Pain? No/denies   Pain Score 0-No pain   Effect of Pain  on Daily Activities pt has pain when she touches her right upper arm and sometimes has difficulty sleeping            [facility] PT Assessment - 10/20/15 0001    Assessment   Medical Diagnosis right sided breast cancer   Referring Provider Burr Medico   Onset Date/Surgical Date 09/29/15   Hand Dominance Right   Prior Therapy none   Precautions   Precautions Other (comment)  lymphedema   Restrictions   Weight Bearing Restrictions No   Balance Screen   Has the patient fallen in the past 6 months No   Has the patient had a decrease in activity level because of a fear of falling?  No   Is the patient reluctant to leave their home because of  a fear of falling?  No   Home Environment   Living Environment Private residence   Living Arrangements Children;Spouse/significant other   Available Help at Discharge Family   Type of [place] to enter   Entrance Stairs-Number of Steps 4   Entrance Stairs-Rails Can reach both   [place] One level   [place] None   Prior Function   Level of Independence Independent  since surgery pt needs assist to reach items and move items   Vocation --  currently out of work but works at [employer] - mostly typing and on the phone   Leisure pt exercises - walks about a mile a day around the neighborhood   Cognition   Overall Cognitive Status Within Functional Limits for tasks assessed   AROM   Right Shoulder Flexion 71 Degrees   Right Shoulder ABduction 52 Degrees   Right Shoulder Internal Rotation --  did not assess secondary to pain   Right Shoulder External Rotation --  did not assess secondary to pain   Left Shoulder Flexion 171 Degrees   Left Shoulder ABduction 161 Degrees   Left Shoulder Internal Rotation 51 Degrees   Left Shoulder External Rotation 90 Degrees           LYMPHEDEMA/ONCOLOGY QUESTIONNAIRE - 10/20/15 1119    Type   Cancer Type right breast cancer   Surgeries   Mastectomy Date 09/29/15   Axillary Lymph Node Dissection Date 09/29/15   Number Lymph Nodes Removed --  pt reports all lymph nodes were removed   Date Lymphedema/Swelling Started   Date 10/06/15   Treatment   Active Chemotherapy Treatment No  pt to begin in 1 week   Past Chemotherapy Treatment No   Active Radiation Treatment No  pt to begin after completing chemo   Past Radiation Treatment No   Current Hormone Treatment No  pt to begin after chemo   Past Hormone Therapy No   What other symptoms do you have   Are you Having Heaviness or Tightness No   Are you having Pain Yes   Are you having pitting edema No   Is it Hard or  Difficult finding clothes that fit No   Do you have infections No   Is there Decreased scar mobility --  did not assess - healing   Lymphedema Assessments   Lymphedema Assessments Upper extremities   Right Upper Extremity Lymphedema   15 cm Proximal to Olecranon Process 26.6 cm   Olecranon Process 24.4 cm   15 cm Proximal to Ulnar Styloid Process 23.3 cm   Just Proximal to Ulnar Styloid Process 16 cm  Across Hand at [employer] 18.9 cm   At [place] of 2nd Digit 5.9 cm   Left Upper Extremity Lymphedema   15 cm Proximal to Olecranon Process 26.7 cm   Olecranon Process 23.5 cm   15 cm Proximal to Ulnar Styloid Process 22.8 cm   Just Proximal to Ulnar Styloid Process 15.6 cm   Across Hand at [employer] 19 cm   At [place] of 2nd Digit 5.5 cm           Quick Dash - 10/20/15 0001    Open a tight or new jar Moderate difficulty   Do heavy household chores (wash walls, wash floors) Moderate difficulty   Carry a shopping bag or briefcase Severe difficulty   Wash your back Severe difficulty   Use a knife to cut food Mild difficulty   Recreational activities in which you take some force or impact through your arm, shoulder, or hand (golf, hammering, tennis) Moderate difficulty   During the past week, to what extent has your arm, shoulder or hand problem interfered with your normal social activities with family, friends, neighbors, or groups? Quite a bit   During the past week, to what extent has your arm, shoulder or hand problem limited your work or other regular daily activities Quite a bit   Arm, shoulder, or hand pain. Moderate   Tingling (pins and needles) in your arm, shoulder, or hand Moderate   Difficulty Sleeping Severe difficulty   DASH Score 59.09 %             [facility] Adult PT Treatment/Exercise - 10/20/15 0001    Shoulder Exercises: Supine   Flexion AAROM;Both;10 reps  with 5 sec holds   ABduction AAROM;Right;10 reps  with 5 sec holds                PT  Education - 10/20/15 1146    Education provided Yes   Education Details supine shoulder dowel exercises   Person(s) Educated Patient   Methods Explanation;Demonstration;Handout;Tactile cues;Verbal cues   Comprehension Verbalized understanding;Returned demonstration           Short Term Clinic Goals - 10/20/15 1154    CC Short Term Goal  #1   Title Pt to demonstrate 100 degrees of right shoulder flexion to allow her to reach items overhead.    Baseline 71   Time 4   Period Weeks   Status New   CC Short Term Goal  #2   Title Pt to demonstrate 52 degrees of right shoulder abduction to allow pt to reach out to sides.   Baseline 52   Time 4   Period Weeks   Status New   CC Short Term Goal  #3   Title Pt will be independent in stating lymphedema risk reduction practices to decrease risk of lymphedema   Baseline 4   Period Weeks   Status New             Long Term Clinic Goals - 10/20/15 1155    CC Long Term Goal  #1   Title Pt will receive appropriate compression garment for management and to decrease risk of RUE edema   Time 8   Period Weeks   Status New   CC Long Term Goal  #2   Title Pt will demonstrate 165 degrees of right shoulder flexion to allow pt to reach items overhead   Baseline 71   Time 8   Period Weeks   Status New  CC Long Term Goal  #3   Title Pt will demonstrate 165 degrees of shoulder abduction to allow pt to reach items out to sides   Baseline 52   Time 8   Period Weeks   Status New   CC Long Term Goal  #4   Title Pt will demonstrate at least 50 degrees of right shoulder external rotation to all her to return to prior level of function   Baseline unable to assess due to pain   Time 8   Period Weeks   Status New   CC Long Term Goal  #5   Title Pt will be independent in a home exercise program for continued strengthening and stretching    Time 8   Period Weeks   Status New   CC Long Term Goal  #6   Title Pt will improve Quick Dash  impairment percentage to less than or equal to 20 percent for improved functional use of RUE   Baseline 59%   Time 8   Period Weeks   Status New   Additional Goals   Additional Goals Yes            Plan - 10/20/15 1148    Clinical Impression Statement Pt presents to physical therapy following a right sided mastectomy and lymph node dissection. She is to begin chemotherapy next week and once completed with begin radiation and hormone therapy. Pt's range of motion is severely limited on the right side in all directions. She has pain in her right upper arm and feels she has swelling though at this point there is not a notable difference in circumference. Pt could be in the first stage of lymphedema and was educated about risk reduction practices today and given a handout. Pt would benefit from skilled PT services for management of edema and for increasing R UE ROM and decreasing  pain.    Rehab Potential Excellent   Clinical Impairments Affecting Rehab Potential to begin chemo next week    PT Frequency 2x / week   PT Duration 8 weeks   PT Treatment/Interventions Manual lymph drainage;Manual techniques;Therapeutic exercise;Vasopneumatic Device;Taping;Passive range of motion;Patient/family education;DME Instruction;ADLs/Self Care Home Management;Scar mobilization;Compression bandaging   PT Next Visit Plan begin AAROM/PROM/AROM of R shoulder, add to HEP   PT Home Exercise Plan supine dowel shoulder exercises   Consulted and Agree with Plan of Care Patient      Patient will benefit from skilled therapeutic intervention in order to improve the following deficits and impairments:  Decreased range of motion, Pain, Impaired UE functional use, Increased fascial restricitons, Decreased strength, Decreased knowledge of use of DME, Decreased knowledge of precautions, Decreased scar mobility, Increased edema  Visit Diagnosis: Stiffness of right shoulder, not elsewhere classified - Plan: PT plan of care  cert/re-cert  Pain in right shoulder - Plan: PT plan of care cert/re-cert  Postmastectomy lymphedema - Plan: PT plan of care cert/re-cert     Problem List Patient Active Problem List   Diagnosis Date Noted  . Breast cancer ([place]) 09/29/2015  . Genetic testing 08/16/2015  . Iron deficiency anemia due to chronic blood loss 08/03/2015  . Family history of breast cancer   . Family history of colon cancer   . Family history of prostate cancer   . Breast cancer of lower-outer quadrant of right female breast ([place]) 07/29/2015  . Leiomyoma of uterus, unspecified 08/01/2012  . Irregular menstrual cycle 08/01/2012  . 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  . Anemia 12/22/2010  . Acid  reflux 12/22/2010    Alexia Freestone 10/20/2015, 12:01 PM  [place], [place], [address] Phone: [phone]   Fax:  [phone]  Name: Melanie Moss MRN: [id number] Date of Birth: October 10, 1975   Allyson Sabal, PT 10/20/2015 12:01 PM

## 2015-10-20 NOTE — Progress Notes (Signed)
forms left in pod- left for dr Burr Medico to sign- faxed  [phone] and mailed to patient and senet ot medical reocrds

## 2015-10-26 ENCOUNTER — Other Ambulatory Visit: Payer: Self-pay | Admitting: *Deleted

## 2015-10-26 ENCOUNTER — Telehealth: Payer: Self-pay | Admitting: *Deleted

## 2015-10-26 ENCOUNTER — Ambulatory Visit: Payer: BLUE CROSS/BLUE SHIELD | Attending: Hematology

## 2015-10-26 DIAGNOSIS — M25611 Stiffness of right shoulder, not elsewhere classified: Secondary | ICD-10-CM | POA: Insufficient documentation

## 2015-10-26 DIAGNOSIS — M25511 Pain in right shoulder: Secondary | ICD-10-CM

## 2015-10-26 DIAGNOSIS — I972 Postmastectomy lymphedema syndrome: Secondary | ICD-10-CM

## 2015-10-26 DIAGNOSIS — C50511 Malignant neoplasm of lower-outer quadrant of right female breast: Secondary | ICD-10-CM

## 2015-10-26 NOTE — Telephone Encounter (Signed)
Spoke with pt and informed pt that office visit and chemo appts will be rescheduled to [facility] 11/01/15.   Pt should receive call from radiology scheduler for instructions of CT scan  7/6 and  Bone scan  7/7.  Pt understood that a scheduler will contact pt in the am for rescheduled appts with Dr. Burr Medico and First time chemo on 11/01/15. POF sent to scheduler.

## 2015-10-26 NOTE — Therapy (Signed)
[facility] [address] [place], [place], [address] Phone: [phone]   Fax:  [phone]  Physical Therapy Treatment  Patient Details  Name: Melanie Moss MRN: [id number] Date of Birth: Jun 24, 1975 Referring Provider: Mosetta Putt  Encounter Date: 10/26/2015      PT End of Session - 10/26/15 0934    Visit Number 2   Number of Visits 17   Date for PT Re-Evaluation 12/15/15   PT Start Time 0845   PT Stop Time 0932   PT Time Calculation (min) 47 min   Activity Tolerance Patient tolerated treatment well;Patient limited by pain   Behavior During Therapy [facility] for tasks assessed/performed      Past Medical History  Diagnosis Date  . Preterm labor   . Anemia   . Hyperlipidemia   . Gestational diabetes   . Breast cancer of lower-outer quadrant of right female breast ([facility]) 07/29/2015  . Family history of breast cancer   . Family history of colon cancer   . Family history of prostate cancer   . Hypertension     not currently on medication  . Anxiety   . GERD (gastroesophageal reflux disease)     tums     Past Surgical History  Procedure Laterality Date  . Dilation and curettage of uterus    . Cervical cerclage  02/19/2011    Procedure: CERCLAGE CERVICAL;  Surgeon: Roseanna Rainbow, MD;  Location: [facility];  Service: Gynecology;  Laterality: N/A;  . Mastectomy with axillary lymph node dissection Right 09/29/2015  . Simple mastectomy with axillary sentinel node biopsy Right 09/29/2015    Procedure: RIGHT TOTAL  MASTECTOMY WITH AXILLARY SENTINEL NODE BIOPSY;  Surgeon: Glenna Fellows, MD;  Location: [facility];  Service: General;  Laterality: Right;  . Portacath placement N/A 09/29/2015    Procedure: INSERTION PORT-A-CATH;  Surgeon: Glenna Fellows, MD;  Location: [facility];  Service: General;  Laterality: N/A;  . Axillary lymph node dissection Right 09/29/2015    Procedure: AXILLARY LYMPH NODE DISSECTION;  Surgeon: Glenna Fellows, MD;   Location: [facility];  Service: General;  Laterality: Right;  . Breast reconstruction with placement of tissue expander and flex hd (acellular hydrated dermis) Right 09/29/2015    Procedure: BREAST RECONSTRUCTION WITH PLACEMENT OF TISSUE EXPANDER AND FLEX HD (ACELLULAR HYDRATED DERMIS);  Surgeon: Alena Bills Dillingham, DO;  Location: [facility];  Service: Plastics;  Laterality: Right;    There were no vitals filed for this visit.      Subjective Assessment - 10/26/15 0847    Subjective I tried moving my Rt arm in small circles over the weekend to keep it from locking up. It doesn't feel better from that but it doesn't feel worse either.    Pertinent History June 8th right side mastectomy and lymph node dissection, pt starts taking chemo next Friday, pt has to see plastic surgeon for implant tomorrow, pt currently has an expander on R, pt to begin radiation after chemotherapy, (1) ER 95% positive, PR 95% positive, Ki-67 30%, HER-2 positive with ratio 2.04, copy # 5.20, (2) ER 90% positive, PR 95% positive, Ki-67 10%, HER-2 negative.   Patient Stated Goals to get as strong as I can as quick as I can   Currently in Pain? Yes   Pain Score 3    Pain Location Arm   Pain Orientation Right   Pain Descriptors / Indicators Aching;Tender;Other (Comment)  Upper arm almost feels inflamed   Pain Frequency Constant   Aggravating  Factors  overusing arm    Pain Relieving Factors pain meds                         [facility] Adult PT Treatment/Exercise - 10/26/15 0001    Shoulder Exercises: Pulleys   Flexion 3 minutes   Flexion Limitations Min VC throughout to decrease Rt scapular compensation.   ABduction 2 minutes   ABduction Limitations VC througout to decrease scapular compensation and Lt trunk side lean   Manual Therapy   Manual Lymphatic Drainage (MLD) In Supine: Short neck, Rt inguinal nodes, Rt axillo-inguinal anastomosis and Rt upper arm doen briefly to help pt relax.    Passive ROM In Supine to  Rt shoulder into gentle flexion, abduction, er, and D2 to pts tolerance.                 PT Education - 10/26/15 0940    Education provided Yes   Education Details Began instruction of lymphedema risk reduction and infection prevention   Person(s) Educated Patient   Methods Explanation   Comprehension Verbalized understanding           Short Term Clinic Goals - 10/26/15 [phone]    CC Short Term Goal  #1   Title Pt to demonstrate 100 degrees of right shoulder flexion to allow her to reach items overhead.    Status On-going   CC Short Term Goal  #2   Title Pt to demonstrate 52 degrees of right shoulder abduction to allow pt to reach out to sides.   Status On-going   CC Short Term Goal  #3   Title Pt will be independent in stating lymphedema risk reduction practices to decrease risk of lymphedema   Status Partially Met             Long Term Clinic Goals - 10/20/15 1155    CC Long Term Goal  #1   Title Pt will receive appropriate compression garment for management and to decrease risk of RUE edema   Time 8   Period Weeks   Status New   CC Long Term Goal  #2   Title Pt will demonstrate 165 degrees of right shoulder flexion to allow pt to reach items overhead   Baseline 71   Time 8   Period Weeks   Status New   CC Long Term Goal  #3   Title Pt will demonstrate 165 degrees of shoulder abduction to allow pt to reach items out to sides   Baseline 52   Time 8   Period Weeks   Status New   CC Long Term Goal  #4   Title Pt will demonstrate at least 50 degrees of right shoulder external rotation to all her to return to prior level of function   Baseline unable to assess due to pain   Time 8   Period Weeks   Status New   CC Long Term Goal  #5   Title Pt will be independent in a home exercise program for continued strengthening and stretching    Time 8   Period Weeks   Status New   CC Long Term Goal  #6   Title Pt will improve Quick Dash impairment percentage to  less than or equal to 20 percent for improved functional use of RUE   Baseline 59%   Time 8   Period Weeks   Status New   Additional Goals   Additional Goals Yes  Plan - 10/26/15 0934    Clinical Impression Statement Pt had trouble relaxing with P/ROM througout session and verbalized pain at all end ranges.. She was able to follow VC to relax but her muscles would become guarded again almost immediately. Ended manual therapy with brief session of manual lymph drainage which helped pt relax a little. Pt tolerated pulleys very well though did require fairly constant VC to relax upper traps and decrease Rt scapular compensation and Lt trunk lean.Pt reported feeling good after session.   Rehab Potential Excellent   Clinical Impairments Affecting Rehab Potential to begin chemo 10/28/15   PT Frequency 2x / week   PT Duration 8 weeks   PT Treatment/Interventions Manual lymph drainage;Manual techniques;Therapeutic exercise;Vasopneumatic Device;Taping;Passive range of motion;Patient/family education;DME Instruction;ADLs/Self Care Home Management;Scar mobilization;Compression bandaging   PT Next Visit Plan Measure A/ROM for goal assess and cont AAROM/PROM/AROM of R shoulder, add to HEP and issue handout for lymphedema risk reduction.    Consulted and Agree with Plan of Care Patient      Patient will benefit from skilled therapeutic intervention in order to improve the following deficits and impairments:  Decreased range of motion, Pain, Impaired UE functional use, Increased fascial restricitons, Decreased strength, Decreased knowledge of use of DME, Decreased knowledge of precautions, Decreased scar mobility, Increased edema  Visit Diagnosis: Pain in right shoulder  Stiffness of right shoulder, not elsewhere classified  Postmastectomy lymphedema     Problem List Patient Active Problem List   Diagnosis Date Noted  . Breast cancer ([place]) 09/29/2015  . Genetic testing 08/16/2015   . Iron deficiency anemia due to chronic blood loss 08/03/2015  . Family history of breast cancer   . Family history of colon cancer   . Family history of prostate cancer   . Breast cancer of lower-outer quadrant of right female breast ([place]) 07/29/2015  . Leiomyoma of uterus, unspecified 08/01/2012  . Irregular menstrual cycle 08/01/2012  . 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  . Anemia 12/22/2010  . Acid reflux 12/22/2010    Otelia Limes, PTA 10/26/2015, 9:41 AM  [place], [place], [address] Phone: [phone]   Fax:  [phone]  Name: Melanie Moss MRN: [id number] Date of Birth: 09-12-1975

## 2015-10-27 ENCOUNTER — Telehealth: Payer: Self-pay | Admitting: *Deleted

## 2015-10-27 ENCOUNTER — Ambulatory Visit (HOSPITAL_COMMUNITY)
Admission: RE | Admit: 2015-10-27 | Discharge: 2015-10-27 | Disposition: A | Discharge status: Home / Self Care | Payer: BLUE CROSS/BLUE SHIELD | Source: Ambulatory Visit | Attending: Hematology | Admitting: Hematology

## 2015-10-27 ENCOUNTER — Telehealth: Payer: Self-pay | Admitting: Hematology

## 2015-10-27 ENCOUNTER — Encounter (HOSPITAL_COMMUNITY): Payer: Self-pay

## 2015-10-27 ENCOUNTER — Other Ambulatory Visit: Payer: BLUE CROSS/BLUE SHIELD

## 2015-10-27 ENCOUNTER — Ambulatory Visit: Payer: BLUE CROSS/BLUE SHIELD | Admitting: Hematology

## 2015-10-27 DIAGNOSIS — C50511 Malignant neoplasm of lower-outer quadrant of right female breast: Secondary | ICD-10-CM | POA: Diagnosis present

## 2015-10-27 DIAGNOSIS — Z9011 Acquired absence of right breast and nipple: Secondary | ICD-10-CM | POA: Diagnosis not present

## 2015-10-27 DIAGNOSIS — K7689 Other specified diseases of liver: Secondary | ICD-10-CM | POA: Diagnosis not present

## 2015-10-27 MED ORDER — IOPAMIDOL (ISOVUE-300) INJECTION 61%
100.0000 mL | Freq: Once | INTRAVENOUS | Status: AC | PRN
  Administered 2015-10-27: 100 mL via INTRAVENOUS

## 2015-10-27 NOTE — Telephone Encounter (Signed)
pt has no voicemail, will try again later

## 2015-10-27 NOTE — Addendum Note (Signed)
Addended by: Alexia Freestone on: 10/27/2015 12:55 PM   Modules accepted: Orders

## 2015-10-27 NOTE — Telephone Encounter (Signed)
spoke w/ pt confirmed 7/11 apts

## 2015-10-27 NOTE — Telephone Encounter (Signed)
Per staff message and POF I have scheduled appts. Advised scheduler of appts. JMW  

## 2015-10-28 ENCOUNTER — Encounter (HOSPITAL_COMMUNITY)
Admission: RE | Admit: 2015-10-28 | Discharge: 2015-10-28 | Disposition: A | Discharge status: Home / Self Care | Payer: BLUE CROSS/BLUE SHIELD | Source: Ambulatory Visit | Attending: Hematology | Admitting: Hematology

## 2015-10-28 ENCOUNTER — Ambulatory Visit: Payer: BLUE CROSS/BLUE SHIELD

## 2015-10-28 ENCOUNTER — Ambulatory Visit (HOSPITAL_COMMUNITY)
Admission: RE | Admit: 2015-10-28 | Discharge: 2015-10-28 | Disposition: A | Discharge status: Home / Self Care | Payer: BLUE CROSS/BLUE SHIELD | Source: Ambulatory Visit | Attending: Hematology | Admitting: Hematology

## 2015-10-28 DIAGNOSIS — C50511 Malignant neoplasm of lower-outer quadrant of right female breast: Secondary | ICD-10-CM

## 2015-10-28 DIAGNOSIS — R932 Abnormal findings on diagnostic imaging of liver and biliary tract: Secondary | ICD-10-CM | POA: Insufficient documentation

## 2015-10-28 MED ORDER — TECHNETIUM TC 99M MEDRONATE IV KIT
24.3000 | PACK | Freq: Once | INTRAVENOUS | Status: AC | PRN
  Administered 2015-10-28: 24.3 via INTRAVENOUS

## 2015-10-31 ENCOUNTER — Ambulatory Visit: Payer: BLUE CROSS/BLUE SHIELD

## 2015-10-31 ENCOUNTER — Other Ambulatory Visit: Payer: Self-pay | Admitting: *Deleted

## 2015-10-31 ENCOUNTER — Telehealth: Payer: Self-pay | Admitting: Hematology

## 2015-10-31 DIAGNOSIS — M25611 Stiffness of right shoulder, not elsewhere classified: Secondary | ICD-10-CM

## 2015-10-31 DIAGNOSIS — M25511 Pain in right shoulder: Secondary | ICD-10-CM

## 2015-10-31 DIAGNOSIS — I972 Postmastectomy lymphedema syndrome: Secondary | ICD-10-CM

## 2015-10-31 NOTE — Telephone Encounter (Signed)
cld pt and left message to adv of time & date of appt for 7/11@8 :30

## 2015-10-31 NOTE — Therapy (Signed)
[place], [place], [address] Phone: [phone]   Fax:  [phone]  Physical Therapy Treatment  Patient Details  Name: Melanie Moss MRN: [id number] Date of Birth: 05/31/1975 Referring Provider: Burr Medico  Encounter Date: 10/31/2015      PT End of Session - 10/31/15 1208    Visit Number 3   Number of Visits 17   Date for PT Re-Evaluation 12/15/15   PT Start Time 1109   PT Stop Time 1158   PT Time Calculation (min) 49 min   Activity Tolerance Patient tolerated treatment well   Behavior During Therapy [facility] for tasks assessed/performed      Past Medical History  Diagnosis Date  . Preterm labor   . Anemia   . Hyperlipidemia   . Gestational diabetes   . Breast cancer of lower-outer quadrant of right female breast ([place]) 07/29/2015  . Family history of breast cancer   . Family history of colon cancer   . Family history of prostate cancer   . Hypertension     not currently on medication  . Anxiety   . GERD (gastroesophageal reflux disease)     tums     Past Surgical History  Procedure Laterality Date  . Dilation and curettage of uterus    . Cervical cerclage  02/19/2011    Procedure: CERCLAGE CERVICAL;  Surgeon: Agnes Lawrence, MD;  Location: [facility];  Service: Gynecology;  Laterality: N/A;  . Mastectomy with axillary lymph node dissection Right 09/29/2015  . Simple mastectomy with axillary sentinel node biopsy Right 09/29/2015    Procedure: RIGHT TOTAL  MASTECTOMY WITH AXILLARY SENTINEL NODE BIOPSY;  Surgeon: Excell Seltzer, MD;  Location: [place];  Service: General;  Laterality: Right;  . Portacath placement N/A 09/29/2015    Procedure: INSERTION PORT-A-CATH;  Surgeon: Excell Seltzer, MD;  Location: [place];  Service: General;  Laterality: N/A;  . Axillary lymph node dissection Right 09/29/2015    Procedure: AXILLARY LYMPH NODE DISSECTION;  Surgeon: Excell Seltzer, MD;  Location: [place];   Service: General;  Laterality: Right;  . Breast reconstruction with placement of tissue expander and flex hd (acellular hydrated dermis) Right 09/29/2015    Procedure: BREAST RECONSTRUCTION WITH PLACEMENT OF TISSUE EXPANDER AND FLEX HD (ACELLULAR HYDRATED DERMIS);  Surgeon: Loel Lofty Dillingham, DO;  Location: [place];  Service: Plastics;  Laterality: Right;    There were no vitals filed for this visit.      Subjective Assessment - 10/31/15 1121    Subjective My expander feels tighter this morning, I think I just slept on it wrong.    Pertinent History June 8th right side mastectomy and lymph node dissection, pt starts taking chemo next Friday, pt has to see plastic surgeon for implant tomorrow, pt currently has an expander on R, pt to begin radiation after chemotherapy, (1) ER 95% positive, PR 95% positive, Ki-67 30%, HER-2 positive with ratio 2.04, copy # 5.20, (2) ER 90% positive, PR 95% positive, Ki-67 10%, HER-2 negative.   Patient Stated Goals to get as strong as I can as quick as I can   Currently in Pain? No/denies            [facility] PT Assessment - 10/31/15 0001    AROM   Right Shoulder Flexion 108 Degrees   Right Shoulder ABduction 90 Degrees   Right Shoulder Internal Rotation 56 Degrees   Right Shoulder External Rotation 67 Degrees  [place] [facility] PT Treatment/Exercise - 10/31/15 0001    Shoulder Exercises: Pulleys   Flexion 2 minutes   Flexion Limitations Min VC to decrease Rt scapular compensation.   ABduction 2 minutes   ABduction Limitations VC to decrease scapular compensation and Lt trunk side lean   Shoulder Exercises: Therapy Ball   Flexion 10 reps   Shoulder Exercises: ROM/Strengthening   Other ROM/Strengthening Exercises Finger Ladder for Rt UE abduction 5 times (got up to #20)   Manual Therapy   Soft tissue mobilization To Rt anterior elbow at cording   Manual Lymphatic Drainage (MLD) In Supine: Short neck, 5 diaphragmatic breaths,  Rt inguinal nodes, Rt axillo-inguinal anastomosis and Rt UE from dorsal hand to lateral shoulder working proximal to distal then retracing all steps.    Passive ROM In Supine to Rt shoulder into gentle flexion, abduction, er, and D2 to pts tolerance.    Neural Stretch To Rt UE with shoulder at 90 degrees of abduction                PT Education - 10/31/15 1201    Education provided Yes   Education Details Demonstrated and pt performed neural tension stretch on wall for Rt UE   Person(s) Educated Patient   Methods Explanation;Demonstration   Comprehension Verbalized understanding;Returned demonstration           Short Term Clinic Goals - 10/31/15 1213    CC Short Term Goal  #1   Title Pt to demonstrate 100 degrees of right shoulder flexion to allow her to reach items overhead.    Baseline 71, 108 degrees 10/1015   Status Achieved   CC Short Term Goal  #2   Title Pt to demonstrate 52 degrees of right shoulder abduction to allow pt to reach out to sides.   Baseline 52, 90 degrees 10/31/15   Status Achieved   CC Short Term Goal  #3   Title Pt will be independent in stating lymphedema risk reduction practices to decrease risk of lymphedema   Status [facility] Goals - 10/31/15 1214    CC Long Term Goal  #1   Title Pt will receive appropriate compression garment for management and to decrease risk of RUE edema   Status On-going   CC Long Term Goal  #2   Title Pt will demonstrate 165 degrees of right shoulder flexion to allow pt to reach items overhead   Baseline 71, 108 degrees 10/31/15   Status On-going   CC Long Term Goal  #3   Title Pt will demonstrate 165 degrees of shoulder abduction to allow pt to reach items out to sides   Baseline 52, 90 degrees 10/31/15   Status On-going   CC Long Term Goal  #4   Title Pt will demonstrate at least 50 degrees of right shoulder external rotation to all her to return to prior level of function    Baseline unable to assess due to pain, 67 degrees 10/31/15   Status Achieved   CC Long Term Goal  #5   Title Pt will be independent in a home exercise program for continued strengthening and stretching    Status On-going   CC Long Term Goal  #6   Title Pt will improve Quick Dash impairment percentage to less than or equal to 20 percent for improved functional use of RUE   Status On-going  Plan - 10/31/15 1208    Clinical Impression Statement Pt did much better with relaxation during P/ROM today and her elbow extension, which was limited due to cording, last week was also much improved as pt continued to work on this over weekend. Still slightly limited with elbow for full extension, maybe lacking 5-10 degrees, though it was as much as approximately -30 degrees last week. Her A/ROM, though also still very limited, has improved greatly since  her first session. This will be pts only appt this week due to having chemo this week and a fill for her expanders.    Rehab Potential Excellent   Clinical Impairments Affecting Rehab Potential to begin chemo 10/28/15   PT Frequency 2x / week   PT Duration 8 weeks   PT Treatment/Interventions Manual lymph drainage;Manual techniques;Therapeutic exercise;Vasopneumatic Device;Taping;Passive range of motion;Patient/family education;DME Instruction;ADLs/Self Care Home Management;Scar mobilization;Compression bandaging;Electrical Stimulation   PT Next Visit Plan Cont AAROM/PROM/AROM of Rt shoulder, add to HEP and issue handout for lymphedema risk reduction. Begin IFC once recert is signed by Dr. Burr Medico for muscle relaxation which though improved, pt still struggles with.     PT Home Exercise Plan Cont supine dowel shoulder exercises and add neural tension stretch on wall   Consulted and Agree with Plan of Care Patient      Patient will benefit from skilled therapeutic intervention in order to improve the following deficits and impairments:  Decreased  range of motion, Pain, Impaired UE functional use, Increased fascial restricitons, Decreased strength, Decreased knowledge of use of DME, Decreased knowledge of precautions, Decreased scar mobility, Increased edema  Visit Diagnosis: Pain in right shoulder  Stiffness of right shoulder, not elsewhere classified  Postmastectomy lymphedema     Problem List Patient Active Problem List   Diagnosis Date Noted  . Breast cancer ([place]) 09/29/2015  . Genetic testing 08/16/2015  . Iron deficiency anemia due to chronic blood loss 08/03/2015  . Family history of breast cancer   . Family history of colon cancer   . Family history of prostate cancer   . Breast cancer of lower-outer quadrant of right female breast ([place]) 07/29/2015  . Leiomyoma of uterus, unspecified 08/01/2012  . Irregular menstrual cycle 08/01/2012  . 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  . Anemia 12/22/2010  . Acid reflux 12/22/2010    Otelia Limes, PTA 10/31/2015, 12:16 PM  [place], [place], [address] Phone: [phone]   Fax:  [phone]  Name: Melanie Moss MRN: [id number] Date of Birth: 1975-07-11

## 2015-11-01 ENCOUNTER — Encounter: Payer: Self-pay | Admitting: Hematology

## 2015-11-01 ENCOUNTER — Ambulatory Visit: Payer: BLUE CROSS/BLUE SHIELD

## 2015-11-01 ENCOUNTER — Ambulatory Visit (HOSPITAL_BASED_OUTPATIENT_CLINIC_OR_DEPARTMENT_OTHER): Payer: BLUE CROSS/BLUE SHIELD | Admitting: Hematology

## 2015-11-01 ENCOUNTER — Encounter: Payer: Self-pay | Admitting: *Deleted

## 2015-11-01 ENCOUNTER — Ambulatory Visit (HOSPITAL_COMMUNITY): Payer: BLUE CROSS/BLUE SHIELD

## 2015-11-01 ENCOUNTER — Other Ambulatory Visit (HOSPITAL_BASED_OUTPATIENT_CLINIC_OR_DEPARTMENT_OTHER): Payer: BLUE CROSS/BLUE SHIELD

## 2015-11-01 ENCOUNTER — Ambulatory Visit (HOSPITAL_BASED_OUTPATIENT_CLINIC_OR_DEPARTMENT_OTHER): Payer: BLUE CROSS/BLUE SHIELD

## 2015-11-01 VITALS — BP 142/87 | HR 101 | Temp 98.2°F | Resp 18

## 2015-11-01 VITALS — BP 146/64 | HR 96 | Temp 98.4°F | Resp 18 | Ht 63.0 in | Wt 147.2 lb

## 2015-11-01 DIAGNOSIS — C50511 Malignant neoplasm of lower-outer quadrant of right female breast: Secondary | ICD-10-CM | POA: Diagnosis not present

## 2015-11-01 DIAGNOSIS — D500 Iron deficiency anemia secondary to blood loss (chronic): Secondary | ICD-10-CM

## 2015-11-01 DIAGNOSIS — D509 Iron deficiency anemia, unspecified: Secondary | ICD-10-CM | POA: Diagnosis not present

## 2015-11-01 DIAGNOSIS — C773 Secondary and unspecified malignant neoplasm of axilla and upper limb lymph nodes: Secondary | ICD-10-CM

## 2015-11-01 DIAGNOSIS — Z5112 Encounter for antineoplastic immunotherapy: Secondary | ICD-10-CM

## 2015-11-01 DIAGNOSIS — Z170 Estrogen receptor positive status [ER+]: Secondary | ICD-10-CM

## 2015-11-01 DIAGNOSIS — Z5111 Encounter for antineoplastic chemotherapy: Secondary | ICD-10-CM

## 2015-11-01 LAB — COMPREHENSIVE METABOLIC PANEL
ALT: 13 U/L (ref 0–55)
AST: 22 U/L (ref 5–34)
Albumin: 4.1 g/dL (ref 3.5–5.0)
Alkaline Phosphatase: 59 U/L (ref 40–150)
Anion Gap: 11 mEq/L (ref 3–11)
BUN: 8.5 mg/dL (ref 7.0–26.0)
CO2: 23 mEq/L (ref 22–29)
Calcium: 9.6 mg/dL (ref 8.4–10.4)
Chloride: 106 mEq/L (ref 98–109)
Creatinine: 0.8 mg/dL (ref 0.6–1.1)
EGFR: >90 mL/min/{1.73_m2} (ref >90)
Glucose: 96 mg/dl (ref 70–140)
Potassium: 3.5 mEq/L (ref 3.5–5.1)
Sodium: 140 mEq/L (ref 136–145)
Total Bilirubin: <0.3 mg/dL (ref 0.20–1.20)
Total Protein: 7.5 g/dL (ref 6.4–8.3)

## 2015-11-01 LAB — IRON AND TIBC
%SAT: 36 % (ref 21–57)
Iron: 91 ug/dL (ref 41–142)
TIBC: 253 ug/dL (ref 236–444)
UIBC: 161 ug/dL (ref 120–384)

## 2015-11-01 LAB — CBC WITH DIFFERENTIAL/PLATELET
BASO%: 0.6 % (ref 0.0–2.0)
Basophils Absolute: 0 10*3/uL (ref 0.0–0.1)
EOS%: 3.2 % (ref 0.0–7.0)
Eosinophils Absolute: 0.2 10*3/uL (ref 0.0–0.5)
HCT: 37.6 % (ref 34.8–46.6)
HGB: 12.5 g/dL (ref 11.6–15.9)
LYMPH%: 36.4 % (ref 14.0–49.7)
MCH: 28.7 pg (ref 25.1–34.0)
MCHC: 33.2 g/dL (ref 31.5–36.0)
MCV: 86.2 fL (ref 79.5–101.0)
MONO#: 0.6 10*3/uL (ref 0.1–0.9)
MONO%: 8.5 % (ref 0.0–14.0)
NEUT#: 3.4 10*3/uL (ref 1.5–6.5)
NEUT%: 51.3 % (ref 38.4–76.8)
Platelets: 283 10*3/uL (ref 145–400)
RBC: 4.36 10*6/uL (ref 3.70–5.45)
RDW: 15.6 % — ABNORMAL HIGH (ref 11.2–14.5)
WBC: 6.6 10*3/uL (ref 3.9–10.3)
lymph#: 2.4 10*3/uL (ref 0.9–3.3)

## 2015-11-01 LAB — FERRITIN: Ferritin: 88 ng/ml (ref 9–269)

## 2015-11-01 MED ORDER — PALONOSETRON HCL INJECTION 0.25 MG/5ML
0.2500 mg | Freq: Once | INTRAVENOUS | Status: AC
  Administered 2015-11-01: 0.25 mg via INTRAVENOUS

## 2015-11-01 MED ORDER — TRASTUZUMAB CHEMO 150 MG IV SOLR
8.0000 mg/kg | Freq: Once | INTRAVENOUS | Status: AC
  Administered 2015-11-01: 525 mg via INTRAVENOUS
  Filled 2015-11-01: qty 25

## 2015-11-01 MED ORDER — DIPHENHYDRAMINE HCL 25 MG PO CAPS
ORAL_CAPSULE | ORAL | Status: AC
  Filled 2015-11-01: qty 2

## 2015-11-01 MED ORDER — HYDROCODONE-ACETAMINOPHEN 5-325 MG PO TABS: 1.0000 | ORAL_TABLET | Freq: Four times a day (QID) | ORAL | Status: DC | PRN

## 2015-11-01 MED ORDER — SODIUM CHLORIDE 0.9 % IV SOLN
719.0000 mg | Freq: Once | INTRAVENOUS | Status: AC
  Administered 2015-11-01: 720 mg via INTRAVENOUS
  Filled 2015-11-01: qty 72

## 2015-11-01 MED ORDER — SODIUM CHLORIDE 0.9 % IV SOLN
75.0000 mg/m2 | Freq: Once | INTRAVENOUS | Status: AC
  Administered 2015-11-01: 130 mg via INTRAVENOUS
  Filled 2015-11-01: qty 13

## 2015-11-01 MED ORDER — DIPHENHYDRAMINE HCL 25 MG PO CAPS
50.0000 mg | ORAL_CAPSULE | Freq: Once | ORAL | Status: AC
  Administered 2015-11-01: 50 mg via ORAL

## 2015-11-01 MED ORDER — SODIUM CHLORIDE 0.9 % IV SOLN
10.0000 mg | Freq: Once | INTRAVENOUS | Status: AC
  Administered 2015-11-01: 10 mg via INTRAVENOUS
  Filled 2015-11-01: qty 1

## 2015-11-01 MED ORDER — HEPARIN SOD (PORK) LOCK FLUSH 100 UNIT/ML IV SOLN
500.0000 [IU] | Freq: Once | INTRAVENOUS | Status: AC | PRN
  Administered 2015-11-01: 500 [IU]
  Filled 2015-11-01: qty 5

## 2015-11-01 MED ORDER — ACETAMINOPHEN 325 MG PO TABS
650.0000 mg | ORAL_TABLET | Freq: Once | ORAL | Status: AC
  Administered 2015-11-01: 650 mg via ORAL

## 2015-11-01 MED ORDER — ACETAMINOPHEN 325 MG PO TABS
ORAL_TABLET | ORAL | Status: AC
  Filled 2015-11-01: qty 2

## 2015-11-01 MED ORDER — SODIUM CHLORIDE 0.9 % IJ SOLN
10.0000 mL | INTRAMUSCULAR | Status: DC | PRN
  Administered 2015-11-01: 10 mL via INTRAVENOUS
  Filled 2015-11-01: qty 10

## 2015-11-01 MED ORDER — SODIUM CHLORIDE 0.9% FLUSH
10.0000 mL | INTRAVENOUS | Status: DC | PRN
  Administered 2015-11-01: 10 mL
  Filled 2015-11-01: qty 10

## 2015-11-01 MED ORDER — SODIUM CHLORIDE 0.9 % IV SOLN
840.0000 mg | Freq: Once | INTRAVENOUS | Status: AC
  Administered 2015-11-01: 840 mg via INTRAVENOUS
  Filled 2015-11-01: qty 28

## 2015-11-01 MED ORDER — PALONOSETRON HCL INJECTION 0.25 MG/5ML
INTRAVENOUS | Status: AC
  Filled 2015-11-01: qty 5

## 2015-11-01 MED ORDER — SODIUM CHLORIDE 0.9 % IV SOLN
Freq: Once | INTRAVENOUS | Status: AC
  Administered 2015-11-01: 10:00:00 via INTRAVENOUS

## 2015-11-01 NOTE — Patient Instructions (Signed)
[facility] Discharge Instructions for Patients Receiving Chemotherapy  Today you received the following chemotherapy agents: Herceptin, Perjeta, Taxotere, and Carboplatin.   To help prevent nausea and vomiting after your treatment, we encourage you to take your nausea medication as prescribed.  If you develop nausea and vomiting that is not controlled by your nausea medication, call the clinic.   BELOW ARE SYMPTOMS THAT SHOULD BE REPORTED IMMEDIATELY:  *FEVER GREATER THAN 100.5 F  *CHILLS WITH OR WITHOUT FEVER  NAUSEA AND VOMITING THAT IS NOT CONTROLLED WITH YOUR NAUSEA MEDICATION  *UNUSUAL SHORTNESS OF BREATH  *UNUSUAL BRUISING OR BLEEDING  TENDERNESS IN MOUTH AND THROAT WITH OR WITHOUT PRESENCE OF ULCERS  *URINARY PROBLEMS  *BOWEL PROBLEMS  UNUSUAL RASH Items with * indicate a potential emergency and should be followed up as soon as possible.  Feel free to call the clinic you have any questions or concerns. The clinic phone number is [phone].  Please show the [facility] at check-in to the Emergency Department and triage nurse.  (Herceptin) Trastuzumab injection for infusion What is this medicine? TRASTUZUMAB (tras TOO zoo mab) is a monoclonal antibody. It is used to treat breast cancer and stomach cancer. This medicine may be used for other purposes; ask your health care provider or pharmacist if you have questions. What should I tell my health care provider before I take this medicine? They need to know if you have any of these conditions: -heart disease -heart failure -infection (especially a virus infection such as chickenpox, cold sores, or herpes) -lung or breathing disease, like asthma -recent or ongoing radiation therapy -an unusual or allergic reaction to trastuzumab, benzyl alcohol, or other medications, foods, dyes, or preservatives -pregnant or trying to get pregnant -breast-feeding How should I use this medicine? This drug is  given as an infusion into a vein. It is administered in a hospital or clinic by a specially trained health care professional. Talk to your pediatrician regarding the use of this medicine in children. This medicine is not approved for use in children. Overdosage: If you think you have taken too much of this medicine contact a poison control center or emergency room at once. NOTE: This medicine is only for you. Do not share this medicine with others. What if I miss a dose? It is important not to miss a dose. Call your doctor or health care professional if you are unable to keep an appointment. What may interact with this medicine? -doxorubicin -warfarin This list may not describe all possible interactions. Give your health care provider a list of all the medicines, herbs, non-prescription drugs, or dietary supplements you use. Also tell them if you smoke, drink alcohol, or use illegal drugs. Some items may interact with your medicine. What should I watch for while using this medicine? Visit your doctor for checks on your progress. Report any side effects. Continue your course of treatment even though you feel ill unless your doctor tells you to stop. Call your doctor or health care professional for advice if you get a fever, chills or sore throat, or other symptoms of a cold or flu. Do not treat yourself. Try to avoid being around people who are sick. You may experience fever, chills and shaking during your first infusion. These effects are usually mild and can be treated with other medicines. Report any side effects during the infusion to your health care professional. Fever and chills usually do not happen with later infusions. Do not become pregnant while  taking this medicine or for 7 months after stopping it. Women should inform their doctor if they wish to become pregnant or think they might be pregnant. Women of child-bearing potential will need to have a negative pregnancy test before starting  this medicine. There is a potential for serious side effects to an unborn child. Talk to your health care professional or pharmacist for more information. Do not breast-feed an infant while taking this medicine or for 7 months after stopping it. Women must use effective birth control with this medicine. What side effects may I notice from receiving this medicine? Side effects that you should report to your doctor or other health care professional as soon as possible: -breathing difficulties -chest pain or palpitations -cough -dizziness or fainting -fever or chills, sore throat -skin rash, itching or hives -swelling of the legs or ankles -unusually weak or tired Side effects that usually do not require medical attention (report to your doctor or other health care professional if they continue or are bothersome): -loss of appetite -headache -muscle aches -nausea This list may not describe all possible side effects. Call your doctor for medical advice about side effects. You may report side effects to FDA at 1-800-FDA-1088. Where should I keep my medicine? This drug is given in a hospital or clinic and will not be stored at home. NOTE: This sheet is a summary. It may not cover all possible information. If you have questions about this medicine, talk to your doctor, pharmacist, or health care provider.    2016, [employer]. (2014-07-16 11:49:32)  (Perjeta) Pertuzumab injection What is this medicine? PERTUZUMAB (per TOOZ ue mab) is a monoclonal antibody. It is used to treat breast cancer. This medicine may be used for other purposes; ask your health care provider or pharmacist if you have questions. What should I tell my health care provider before I take this medicine? They need to know if you have any of these conditions: -heart disease -heart failure -high blood pressure -history of irregular heart beat -recent or ongoing radiation therapy -an unusual or allergic reaction  to pertuzumab, other medicines, foods, dyes, or preservatives -pregnant or trying to get pregnant -breast-feeding How should I use this medicine? This medicine is for infusion into a vein. It is given by a health care professional in a hospital or clinic setting. Talk to your pediatrician regarding the use of this medicine in children. Special care may be needed. Overdosage: If you think you have taken too much of this medicine contact a poison control center or emergency room at once. NOTE: This medicine is only for you. Do not share this medicine with others. What if I miss a dose? It is important not to miss your dose. Call your doctor or health care professional if you are unable to keep an appointment. What may interact with this medicine? Interactions are not expected. Give your health care provider a list of all the medicines, herbs, non-prescription drugs, or dietary supplements you use. Also tell them if you smoke, drink alcohol, or use illegal drugs. Some items may interact with your medicine. This list may not describe all possible interactions. Give your health care provider a list of all the medicines, herbs, non-prescription drugs, or dietary supplements you use. Also tell them if you smoke, drink alcohol, or use illegal drugs. Some items may interact with your medicine. What should I watch for while using this medicine? Your condition will be monitored carefully while you are receiving this medicine. Report any side  effects. Continue your course of treatment even though you feel ill unless your doctor tells you to stop. Do not become pregnant while taking this medicine or for 7 months after stopping it. Women should inform their doctor if they wish to become pregnant or think they might be pregnant. Women of child-bearing potential will need to have a negative pregnancy test before starting this medicine. There is a potential for serious side effects to an unborn child. Talk to your  health care professional or pharmacist for more information. Do not breast-feed an infant while taking this medicine or for 7 months after stopping it. Women must use effective birth control with this medicine. Call your doctor or health care professional for advice if you get a fever, chills or sore throat, or other symptoms of a cold or flu. Do not treat yourself. Try to avoid being around people who are sick. You may experience fever, chills, and headache during the infusion. Report any side effects during the infusion to your health care professional. What side effects may I notice from receiving this medicine? Side effects that you should report to your doctor or health care professional as soon as possible: -breathing problems -chest pain or palpitations -dizziness -feeling faint or lightheaded -fever or chills -skin rash, itching or hives -sore throat -swelling of the face, lips, or tongue -swelling of the legs or ankles -unusually weak or tired Side effects that usually do not require medical attention (Report these to your doctor or health care professional if they continue or are bothersome.): -diarrhea -hair loss -nausea, vomiting -tiredness This list may not describe all possible side effects. Call your doctor for medical advice about side effects. You may report side effects to FDA at 1-800-FDA-1088. Where should I keep my medicine? This drug is given in a hospital or clinic and will not be stored at home. NOTE: This sheet is a summary. It may not cover all possible information. If you have questions about this medicine, talk to your doctor, pharmacist, or health care provider.    2016, [employer] Standard. (2014-07-16 16:07:57)  (Taxotere) Docetaxel injection What is this medicine? DOCETAXEL (doe se TAX el) is a chemotherapy drug. It targets fast dividing cells, like cancer cells, and causes these cells to die. This medicine is used to treat many types of cancers like  breast cancer, certain stomach cancers, head and neck cancer, lung cancer, and prostate cancer. This medicine may be used for other purposes; ask your health care provider or pharmacist if you have questions. What should I tell my health care provider before I take this medicine? They need to know if you have any of these conditions: -infection (especially a virus infection such as chickenpox, cold sores, or herpes) -liver disease -low blood counts, like low white cell, platelet, or red cell counts -an unusual or allergic reaction to docetaxel, polysorbate 80, other chemotherapy agents, other medicines, foods, dyes, or preservatives -pregnant or trying to get pregnant -breast-feeding How should I use this medicine? This drug is given as an infusion into a vein. It is administered in a hospital or clinic by a specially trained health care professional. Talk to your pediatrician regarding the use of this medicine in children. Special care may be needed. Overdosage: If you think you have taken too much of this medicine contact a poison control center or emergency room at once. NOTE: This medicine is only for you. Do not share this medicine with others. What if I miss a dose? It is  important not to miss your dose. Call your doctor or health care professional if you are unable to keep an appointment. What may interact with this medicine? -cyclosporine -erythromycin -ketoconazole -medicines to increase blood counts like filgrastim, pegfilgrastim, sargramostim -vaccines Talk to your doctor or health care professional before taking any of these medicines: -acetaminophen -aspirin -ibuprofen -ketoprofen -naproxen This list may not describe all possible interactions. Give your health care provider a list of all the medicines, herbs, non-prescription drugs, or dietary supplements you use. Also tell them if you smoke, drink alcohol, or use illegal drugs. Some items may interact with your  medicine. What should I watch for while using this medicine? Your condition will be monitored carefully while you are receiving this medicine. You will need important blood work done while you are taking this medicine. This drug may make you feel generally unwell. This is not uncommon, as chemotherapy can affect healthy cells as well as cancer cells. Report any side effects. Continue your course of treatment even though you feel ill unless your doctor tells you to stop. In some cases, you may be given additional medicines to help with side effects. Follow all directions for their use. Call your doctor or health care professional for advice if you get a fever, chills or sore throat, or other symptoms of a cold or flu. Do not treat yourself. This drug decreases your body's ability to fight infections. Try to avoid being around people who are sick. This medicine may increase your risk to bruise or bleed. Call your doctor or health care professional if you notice any unusual bleeding. This medicine may contain alcohol in the product. You may get drowsy or dizzy. Do not drive, use machinery, or do anything that needs mental alertness until you know how this medicine affects you. Do not stand or sit up quickly, especially if you are an older patient. This reduces the risk of dizzy or fainting spells. Avoid alcoholic drinks. Do not become pregnant while taking this medicine. Women should inform their doctor if they wish to become pregnant or think they might be pregnant. There is a potential for serious side effects to an unborn child. Talk to your health care professional or pharmacist for more information. Do not breast-feed an infant while taking this medicine. What side effects may I notice from receiving this medicine? Side effects that you should report to your doctor or health care professional as soon as possible: -allergic reactions like skin rash, itching or hives, swelling of the face, lips, or  tongue -low blood counts - This drug may decrease the number of white blood cells, red blood cells and platelets. You may be at increased risk for infections and bleeding. -signs of infection - fever or chills, cough, sore throat, pain or difficulty passing urine -signs of decreased platelets or bleeding - bruising, pinpoint red spots on the skin, black, tarry stools, nosebleeds -signs of decreased red blood cells - unusually weak or tired, fainting spells, lightheadedness -breathing problems -fast or irregular heartbeat -low blood pressure -mouth sores -nausea and vomiting -pain, swelling, redness or irritation at the injection site -pain, tingling, numbness in the hands or feet -swelling of the ankle, feet, hands -weight gain Side effects that usually do not require medical attention (report to your prescriber or health care professional if they continue or are bothersome): -bone pain -complete hair loss including hair on your head, underarms, pubic hair, eyebrows, and eyelashes -diarrhea -excessive tearing -changes in the color of fingernails -loosening of  the fingernails -nausea -muscle pain -red flush to skin -sweating -weak or tired This list may not describe all possible side effects. Call your doctor for medical advice about side effects. You may report side effects to FDA at 1-800-FDA-1088. Where should I keep my medicine? This drug is given in a hospital or clinic and will not be stored at home. NOTE: This sheet is a summary. It may not cover all possible information. If you have questions about this medicine, talk to your doctor, pharmacist, or health care provider.    2016, [employer]. (2014-04-26 16:04:57)  Carboplatin injection What is this medicine? CARBOPLATIN (KAR boe pla tin) is a chemotherapy drug. It targets fast dividing cells, like cancer cells, and causes these cells to die. This medicine is used to treat ovarian cancer and many other  cancers. This medicine may be used for other purposes; ask your health care provider or pharmacist if you have questions. What should I tell my health care provider before I take this medicine? They need to know if you have any of these conditions: -blood disorders -hearing problems -kidney disease -recent or ongoing radiation therapy -an unusual or allergic reaction to carboplatin, cisplatin, other chemotherapy, other medicines, foods, dyes, or preservatives -pregnant or trying to get pregnant -breast-feeding How should I use this medicine? This drug is usually given as an infusion into a vein. It is administered in a hospital or clinic by a specially trained health care professional. Talk to your pediatrician regarding the use of this medicine in children. Special care may be needed. Overdosage: If you think you have taken too much of this medicine contact a poison control center or emergency room at once. NOTE: This medicine is only for you. Do not share this medicine with others. What if I miss a dose? It is important not to miss a dose. Call your doctor or health care professional if you are unable to keep an appointment. What may interact with this medicine? -medicines for seizures -medicines to increase blood counts like filgrastim, pegfilgrastim, sargramostim -some antibiotics like amikacin, gentamicin, neomycin, streptomycin, tobramycin -vaccines Talk to your doctor or health care professional before taking any of these medicines: -acetaminophen -aspirin -ibuprofen -ketoprofen -naproxen This list may not describe all possible interactions. Give your health care provider a list of all the medicines, herbs, non-prescription drugs, or dietary supplements you use. Also tell them if you smoke, drink alcohol, or use illegal drugs. Some items may interact with your medicine. What should I watch for while using this medicine? Your condition will be monitored carefully while you are  receiving this medicine. You will need important blood work done while you are taking this medicine. This drug may make you feel generally unwell. This is not uncommon, as chemotherapy can affect healthy cells as well as cancer cells. Report any side effects. Continue your course of treatment even though you feel ill unless your doctor tells you to stop. In some cases, you may be given additional medicines to help with side effects. Follow all directions for their use. Call your doctor or health care professional for advice if you get a fever, chills or sore throat, or other symptoms of a cold or flu. Do not treat yourself. This drug decreases your body's ability to fight infections. Try to avoid being around people who are sick. This medicine may increase your risk to bruise or bleed. Call your doctor or health care professional if you notice any unusual bleeding. Be careful brushing and flossing  your teeth or using a toothpick because you may get an infection or bleed more easily. If you have any dental work done, tell your dentist you are receiving this medicine. Avoid taking products that contain aspirin, acetaminophen, ibuprofen, naproxen, or ketoprofen unless instructed by your doctor. These medicines may hide a fever. Do not become pregnant while taking this medicine. Women should inform their doctor if they wish to become pregnant or think they might be pregnant. There is a potential for serious side effects to an unborn child. Talk to your health care professional or pharmacist for more information. Do not breast-feed an infant while taking this medicine. What side effects may I notice from receiving this medicine? Side effects that you should report to your doctor or health care professional as soon as possible: -allergic reactions like skin rash, itching or hives, swelling of the face, lips, or tongue -signs of infection - fever or chills, cough, sore throat, pain or difficulty passing  urine -signs of decreased platelets or bleeding - bruising, pinpoint red spots on the skin, black, tarry stools, nosebleeds -signs of decreased red blood cells - unusually weak or tired, fainting spells, lightheadedness -breathing problems -changes in hearing -changes in vision -chest pain -high blood pressure -low blood counts - This drug may decrease the number of white blood cells, red blood cells and platelets. You may be at increased risk for infections and bleeding. -nausea and vomiting -pain, swelling, redness or irritation at the injection site -pain, tingling, numbness in the hands or feet -problems with balance, talking, walking -trouble passing urine or change in the amount of urine Side effects that usually do not require medical attention (report to your doctor or health care professional if they continue or are bothersome): -hair loss -loss of appetite -metallic taste in the mouth or changes in taste This list may not describe all possible side effects. Call your doctor for medical advice about side effects. You may report side effects to FDA at 1-800-FDA-1088. Where should I keep my medicine? This drug is given in a hospital or clinic and will not be stored at home. NOTE: This sheet is a summary. It may not cover all possible information. If you have questions about this medicine, talk to your doctor, pharmacist, or health care provider.    2016, [employer] Standard. (2007-07-15 14:38:05)

## 2015-11-01 NOTE — Progress Notes (Signed)
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 breast [facility])   Staging form: Breast, AJCC 7th Edition     Clinical stage from 08/03/2015: Stage IA (T1c, N0, M0) - Unsigned     Pathologic stage from 09/29/2015: Stage IIIA (T3(m), N1a, cM0) - Signed by Truitt Merle, MD on 10/17/2015         Breast cancer of lower-outer quadrant of right female breast ([place])   07/25/2015 Mammogram diagnostic mammogram and ultrasound showed a 11 mm mass in the 8:00 position of the right breast, small irregular lesion in the retroareolar regio of the right breast, contiguous with the dermis   07/26/2015 Initial Diagnosis Breast cancer of lower-outer quadrant of right female breast ([place])   07/26/2015 Initial Biopsy right breast needle core biopsy at 8:00 position (1), invasive ductal carcinoma, grade 3, 6:30 o'clock position invasive (2) and in situ ductal carcinoma, grade 1-2, lymphovascular invasion is identified   07/26/2015 Receptors her2 (1) ER 95% positive, PR 95% positive, Ki-67 30%, HER-2 positive with ratio 2.04, copy # 5.20, (2) ER 90% positive, PR 95% positive, Ki-67 10%, HER-2 negative.   08/02/2015 Imaging breast MRI showed extensive mass  and non-mass enhancement in the lower outer quadrant of the right breast  spanning 13 cm, focal enhancement within the right nipple, at least 3-4 abnormal appearing right axillary lymph node.   09/29/2015 Surgery right mastectomy and axillar node dissection    09/29/2015 Pathology Results Right mastectomy and axillary node dissection showed multifocal mammary carcinoma with ductal and lobular features,  tumor size 13 cm, tubal 1 cm, 1.9 cm, 2.5 cm, grade 2-3, anterior margins were positive, 2 nodes with metastasis and one with isolated cells    HISTORY OF PRESENTING ILLNESS (08/03/2015):  Melanie Moss 40 y.o. female is here because of Her newly diagnosed right breast cancer. She is accompanied by her fianc, sister and mother to our multidisciplinary breast clinic today.  She felt a breast lump and noticed mild intermittent breast pain 2 weeks ago, no nipple discharge or skin change. She also has moderate fatigue and dyspnea on moderate to heavy exertion for a few months, no weight loss, no other pain or other symptoms.  She had normal mammgram 6 years ago, for a small lump which felt to be benign and no biopsy was done.    She has iregular period, twice a month, it lasts about 5-7 days, heavy for 3 days, she chanes 6-7 times a day. She was found to be anemic recently, and has started taking oral iron pill a few weeks ago, tolerates well.  GYN HISTORY  Menarchal: 10  LMP: 07/29/2015 Contraceptive: no  HRT: n/a  G4P1: 3 miscarrage, one 10 yo son, no plan to have more children   CURRENT THERAPY: Adjuvant chemo TCHP every 3 weeks, started on 11/01/2015  INTERIM HISTORY: [name] returns for follow-up and her first cycle chemotherapy. She has been doing well, feels better after IV Feraheme. Her only complaint is a pain from her tissue expander, she feels hydrocodone has more helpful than oxycodone. She denies any significant at the pain or other symptoms. She has good appetite and eats well.  MEDICAL HISTORY:  Past Medical History  Diagnosis Date  . Preterm labor   . Anemia   . Hyperlipidemia   . Gestational diabetes   . Breast cancer of lower-outer quadrant of right female breast ([place]) 07/29/2015  . Family history of breast cancer   . Family history of colon cancer   . Family history of prostate cancer   . Hypertension     not currently on medication  . Anxiety   . GERD  (gastroesophageal reflux disease)     tums     SURGICAL HISTORY: Past Surgical History  Procedure Laterality Date  . Dilation and curettage of uterus    . Cervical cerclage  02/19/2011    Procedure: CERCLAGE CERVICAL;  Surgeon: Agnes Lawrence, MD;  Location: [facility];  Service: Gynecology;  Laterality: N/A;  . Mastectomy with axillary lymph node dissection Right 09/29/2015  . Simple mastectomy with axillary sentinel node biopsy Right 09/29/2015    Procedure: RIGHT TOTAL  MASTECTOMY WITH AXILLARY SENTINEL NODE BIOPSY;  Surgeon: Excell Seltzer, MD;  Location: [place];  Service: General;  Laterality: Right;  . Portacath placement N/A 09/29/2015    Procedure: INSERTION PORT-A-CATH;  Surgeon: Excell Seltzer, MD;  Location: [place];  Service: General;  Laterality: N/A;  . Axillary lymph node dissection Right 09/29/2015    Procedure: AXILLARY LYMPH NODE DISSECTION;  Surgeon: Excell Seltzer, MD;  Location: [place];  Service: General;  Laterality: Right;  . Breast reconstruction with placement of tissue expander and flex hd (acellular hydrated dermis) Right 09/29/2015    Procedure: BREAST RECONSTRUCTION WITH PLACEMENT OF TISSUE EXPANDER AND FLEX HD (ACELLULAR HYDRATED DERMIS);  Surgeon: Loel Lofty Dillingham, DO;  Location: [place];  Service: Plastics;  Laterality: Right;    SOCIAL HISTORY: Social History   Social History  . Marital Status: Single    Spouse Name: N/A  . Number of Children: 1  . Years of Education: N/A   Occupational History  . Not on file.   Social History Main Topics  . Smoking status: Never Smoker   . Smokeless tobacco: Never Used  . Alcohol Use: No  . Drug Use: No  . Sexual Activity: Yes   Other Topics Concern  . Not on file   Social History Narrative    FAMILY HISTORY: Family History  Problem Relation Age of Onset  . Hypertension Mother   . Hypertension Father   . Prostate cancer Father 59  . Breast cancer Maternal Aunt 35  . Breast cancer Paternal Aunt 20    . Cirrhosis Maternal Uncle   . Prostate cancer Paternal Uncle   . Throat cancer Maternal Aunt     non smoker  . Prostate cancer Paternal Uncle   . Colon cancer Cousin     maternal first cousin dx in his 26s  . Lung cancer Cousin     smoker    ALLERGIES:  has No Known Allergies.  MEDICATIONS:  Current Outpatient Prescriptions  Medication Sig Dispense Refill  . B Complex-C (SUPER B COMPLEX PO) Take 1 tablet by mouth daily.    . diazepam (VALIUM) 2 MG tablet Take 2 mg by mouth every 6 (six) hours as needed for anxiety.   0  . diphenhydrAMINE (BENADRYL) 12.5 MG/5ML elixir Take 5 mLs (12.5 mg total) by mouth every 6 (six) hours as needed for itching. 120 mL 0  . ferrous sulfate 325 (65 FE) MG tablet Take 325 mg by mouth 2 (two) times daily with a meal.    .  ibuprofen (ADVIL,MOTRIN) 200 MG tablet Take 200 mg by mouth every 6 (six) hours as needed.    . lidocaine-prilocaine (EMLA) cream Apply to affected area once 30 g 3  . Multiple Vitamin (MULTIVITAMIN) tablet Take 1 tablet by mouth daily.    Marland Kitchen HYDROcodone-acetaminophen (NORCO/VICODIN) 5-325 MG tablet Take 1 tablet by mouth every 6 (six) hours as needed for severe pain. Reported on 11/01/2015 30 tablet 0  . ondansetron (ZOFRAN) 8 MG tablet Take 1 tablet (8 mg total) by mouth 2 (two) times daily as needed for refractory nausea / vomiting. Start on day 3 after chemo. (Patient not taking: Reported on 10/20/2015) 30 tablet 1  . ondansetron (ZOFRAN-ODT) 4 MG disintegrating tablet Take 4 mg by mouth every 8 (eight) hours as needed for nausea or vomiting. Reported on 11/01/2015  0  . polyethylene glycol (MIRALAX / GLYCOLAX) packet Take 17 g by mouth daily as needed for mild constipation. (Patient not taking: Reported on 11/01/2015) 14 each 0  . prochlorperazine (COMPAZINE) 10 MG tablet Take 1 tablet (10 mg total) by mouth every 6 (six) hours as needed (Nausea or vomiting). (Patient not taking: Reported on 11/01/2015) 30 tablet 1  . senna (SENOKOT) 8.6  MG TABS tablet Take 1 tablet (8.6 mg total) by mouth 2 (two) times daily. (Patient not taking: Reported on 11/01/2015) 120 each 0   No current facility-administered medications for this visit.   Facility-Administered Medications Ordered in Other Visits  Medication Dose Route Frequency Provider Last Rate Last Dose  . sodium chloride 0.9 % injection 10 mL  10 mL Intravenous PRN Truitt Merle, MD   10 mL at 11/01/15 0850    REVIEW OF SYSTEMS:   Constitutional: Denies fevers, chills or abnormal night sweats Eyes: Denies blurriness of vision, double vision or watery eyes Ears, nose, mouth, throat, and face: Denies mucositis or sore throat Respiratory: Denies cough, dyspnea or wheezes Cardiovascular: Denies palpitation, chest discomfort or lower extremity swelling Gastrointestinal:  Denies nausea, heartburn or change in bowel habits Skin: Denies abnormal skin rashes Lymphatics: Denies new lymphadenopathy or easy bruising Neurological:Denies numbness, tingling or new weaknesses Behavioral/Psych: Mood is stable, no new changes  All other systems were reviewed with the patient and are negative.  PHYSICAL EXAMINATION: ECOG PERFORMANCE STATUS: 0   Filed Vitals:   11/01/15 0911  BP: 146/64  Pulse: 96  Temp: 98.4 F (36.9 C)  Resp: 18   Filed Weights   11/01/15 0911  Weight: 147 lb 3.2 oz (66.769 kg)    GENERAL:alert, no distress and comfortable SKIN: skin color, texture, turgor are normal, no rashes or significant lesions EYES: normal, conjunctiva are pink and non-injected, sclera clear OROPHARYNX:no exudate, no erythema and lips, buccal mucosa, and tongue normal  NECK: supple, thyroid normal size, non-tender, without nodularity LYMPH:  no palpable lymphadenopathy in the cervical, axillary or inguinal LUNGS: clear to auscultation and percussion with normal breathing effort HEART: regular rate & rhythm and no murmurs and no lower extremity edema ABDOMEN:abdomen soft, non-tender and normal  bowel sounds Musculoskeletal:no cyanosis of digits and no clubbing  PSYCH: alert & oriented x 3 with fluent speech NEURO: no focal motor/sensory deficits Breasts: Breast inspection showed them to be symmetrical with no skin change or nipple discharge.  Status post right mastectomy, surgical scar has healed well, no significant discharge or skin erythema. Palpation of the left breast and bilateral axilla showed no palpable mass.  LABORATORY DATA:  I have reviewed the data as listed CBC Latest Ref Rng 11/01/2015  09/29/2015 09/21/2015  WBC 3.9 - 10.3 10e3/uL 6.6 14.8(H) 6.3  Hemoglobin 11.6 - 15.9 g/dL 12.5 11.0(L) 12.6  Hematocrit 34.8 - 46.6 % 37.6 35.0(L) 40.2  Platelets 145 - 400 10e3/uL 283 312 298    CMP Latest Ref Rng 11/01/2015 09/29/2015 09/21/2015  Glucose 70 - 140 mg/dl 96 203(H) 96  BUN 7.0 - 26.0 mg/dL 8.'5 6 10  '$ Creatinine 0.6 - 1.1 mg/dL 0.8 0.73 0.77  Sodium 136 - 145 mEq/L 140 133(L) 139  Potassium 3.5 - 5.1 mEq/L 3.5 3.6 3.8  Chloride 101 - 111 mmol/L - 101 107  CO2 22 - 29 mEq/L '23 23 25  '$ Calcium 8.4 - 10.4 mg/dL 9.6 8.6(L) 9.4  Total Protein 6.4 - 8.3 g/dL 7.5 - -  Total Bilirubin 0.20 - 1.20 mg/dL <0.30 - -  Alkaline Phos 40 - 150 U/L 59 - -  AST 5 - 34 U/L 22 - -  ALT 0 - 55 U/L 13 - -    PATHOLOGY: Diagnosis 09/29/2015 1. Lymph node, sentinel, biopsy, Right axillary #1 - ONE BENIGN LYMPH NODE WITH NO TUMOR SEEN (0/1). 2. Lymph node, sentinel, biopsy, Right axillary #2 - ONE LYMPH NODE POSITIVE FOR METASTATIC MAMMARY CARCINOMA (1/1). 3. Lymph node, sentinel, biopsy, Right axillary #4 - ONE BENIGN LYMPH NODE WITH NO TUMOR SEEN (0/1). 4. 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 5. 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 - LARGEST FOCUS OF INVASIVE MAMMARY CARCINOMA IS GRADE 2, SHOWS ASSOCIATED LOBULAR  CARCINOMA IN SITU AND MEASURES 13 CM IN GREATEST DIMENSION. - SECOND LARGEST FOCUS SHOWS INVASIVE GRADE 3 MAMMARY CARCINOMA AND SPANS 2.1 CM IN GREATEST DIMENSION. - THIRD LARGEST FOCUS SHOWS INVASIVE GRADE 2 MAMMARY CARCINOMA WITH LOBULAR CARCINOMA IN SITU AND MEASURES 1.9 CM IN GREATEST DIMENSION. - SMALLEST FOCUS SHOWS INVASIVE GRADE 3 MAMMARY CARCINOMA AND MEASURES 0.5 CM IN GREATEST DIMENSION. - ANTERIOR INFERIOR SOFT TISSUE MARGIN DEMONSTRATES BROAD POSITIVITY FOR INVASIVE MAMMARY CARCINOMA. - OTHER MARGINS ARE NEGATIVE. - SEE ONCOLOGY TEMPLATE. 6. Lymph node, sentinel, biopsy, Right axillary #3 - ONE BENIGN LYMPH NODE WITH NO TUMOR SEEN (0/1). 7. Lymph node, sentinel, biopsy, Right axillary #6 - ONE LYMPH NODE WITH ISOLATED TUMOR CELLS. 8. Lymph nodes, regional resection, Right axillary contents - ELEVEN BENIGN LYMPH NODES WITH NO TUMOR SEEN (0/11). Specimen, including laterality and lymph node sampling (sentinel, non-sentinel): Right breast with right sentinel and non-sentinel lymph nodes. Procedure: Right modified mastectomy with sentinel lymph node biopsy and axillary dissection. Histologic type: Multifocal mammary carcinoma with ductal and lobular features. Largest focus (13 cm): Grade: 2. Tubule formation: 3. Nuclear pleomorphism: 2. Mitotic: 1. Second largest focus (2.1 cm): Grade: 3. Tubule formation: 3. Nuclear pleomorphism: 2. Mitotic: 3. Third largest focus (1.9 cm): Grade: 2. Tubule formation: 3. Nuclear pleomorphism: 2. Mitotic: 1. Smallest focus (0.5 cm): Grade: 3. Tubule formation: 3. Nuclear pleomorphism: 2. Mitotic: 3. Tumor size (gross measurements): 13 cm, 2.1 cm, 1.9 cm and 0.5 cm. Margins: Invasive, distance to closest margin: Invasive mammary carcinoma broadly involves the anterior inferior soft tissue margin. In-situ, distance to closest margin: Margin uninvolved by in situ carcinoma. If margin positive, focally or broadly: Broadly  positive. Lymphovascular invasion: Yes, identified. Ductal carcinoma in situ: Not identified. Grade: Not applicable. Extensive intraductal component: Not applicable. Lobular neoplasia: Yes, lobular carcinoma in situ is present. Tumor focality: Multifocal. Treatment effect: Not applicable. If  present, treatment effect in breast tissue, lymph nodes or both: Not applicable. Extent of tumor: Skin: Not involved. Nipple: Not involved. Skeletal muscle: Not received. Lymph nodes: Examined: 6 Sentinel. 11 Non-sentinel. 17 Total. Lymph nodes with metastasis: 2 with metastasis and 1 with isolated tumor cells. Isolated tumor cells (< 0.2 mm): 1. Micrometastasis: (> 0.2 mm and < 2.0 mm): 0. Macrometastasis: (> 2.0 mm): 2. Extracapsular extension: Not identified.  Breast prognostic profile: Performed on previous case, [id number]. Right 8 o'clock biopsy 12 cm from the nipple: Estrogen receptor: 95%, positive. Progesterone receptor: 95%, positive. Her-2 neu: 2.04 ratio, positive. Ki-67: 30%. Right breast needle core biopsy 6:30 o'clock position 1 cm from the nipple: Estrogen receptor: 90%, positive. Progesterone receptor: 95%, positive. Her-2 neu: 1.39 ratio, negative. Ki-67: 10%. Non-neoplastic breast: No significant non-neoplastic breast findings.  TNM: pT3(m), pN1a. Comments: A cytokeratin AE1/AE3 immunohistochemical stain is performed on eight blocks containing lymph node tissue (eight stains total). The morphology coupled with the staining pattern is consistent with the above findings. An E-cadherin immunohistochemical stain is also performed on four blocks from the right simple mastectomy specimen. The staining pattern coupled with the morphology is consistent with the above findings. As there was Her-2 neu positivity in one of the biopsies and both biopsies were positive for estrogen receptor, a repeat breast prognostic profile will not be performed unless otherwise requested. Dr.  Lyndon Code has seen selected slides 5E, 29F, 5G, 5J, 5N, 5O, and 5P with agreement that the tumor is a mulitfocal invasive mammary carcinoma with a mixed ductal and lobular phenotype. Dr. Tresa Moore has also seen a selected slide (5P) with agreement of the positive anterior inferior margin. (RH:ecj 10/03/2015)   RADIOGRAPHIC STUDIES: I have personally reviewed the radiological images as listed and agreed with the findings in the report. No new scans  Bone scan 10/28/2015 IMPRESSION: Small focus of increased radiotracer uptake within the right lateral skull at the region of the right temporal bone. CT of the head may be considered for further evaluation.  Otherwise no evidence of osseous metastatic disease in the axial skeleton.  CT chest, abdomen and pelvis  10/27/2015 IMPRESSION: Status post right mastectomy and right axillary lymph node dissection.  No findings specific for metastatic disease.  Scattered hepatic cysts measuring up to 12 mm. A single 8 mm hypoenhancing lesion is technically indeterminate, but also likely benign, although motion degraded. Consider attention on follow-up.    ASSESSMENT & PLAN:  40 year old African-American female, premenopausal, presented with a palpable right breast mass.  1. 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 Her initial biopsy showed 1 triple positive disease, the other was ER/PR positive and PR negative disease. -Given her stage III disease, I recommend staging CT chest, abdomen and pelvis with contrast, and bone scan to ruled out distant metastasis. -We discussed that HER-2 positive breast cancer are more aggressive, with a high risk for  recurrence after surgery, especially in the setting of locally advanced stage. I recommend her to have adjuvant chemotherapy TCHP (docetaxel, carboplatin, Herceptin, pejeta) every 3 weeks for total of 6 cycles, followed by Herceptin maintenance therapy to complete 1 year treatment. -Her baseline echo was normal, which was done in April 2017, she will follow-up with cardiologist to monitor  her heart during her and her to therapy -I discussed her staging CT and bone scan results, showed no definitive evidence of metastasis. This mild uptake in the right lateral skull on bone scan, I will obtain a CT head without contrast for further evaluation. -Lab result reviewed, adequate for treatment, we'll proceed cycle 1 TCHP today.   2. Iron deficient anemia -Her lab tests showed a moderate anemia with hemoglobin 7.8, low MCV 62.7, her serum iron level and transferrin saturation on low, ferritin 25, this is consistent with iron deficient anemia, likely secondary to her menorrhagia. -Her anemia has resolved after IV Feraheme. -I encouraged her to continue oral iron pill  3. Genetic -Given her young age and positive family history, we strongly recommend her to [facility] genetic counseling for genetic testing to ruled out inheritable breast cancer syndrome. She agrees. -Her genetic testing was normal  4. Breast pain from tissue expander -I reviewed her Vicodin today, she only takes once every few days.  Plan -CT head to rule out skull bone mets -first cycle TCHP today, with Neulasta injection on day 3 -I'll see her back next week for toxicity check up, she will see APP in 3 weeks before cycle 2 chemo  All questions were answered. The patient and her family members agree with the above plan. The patient knows to call the clinic with any problems, questions or concerns.  I spent 20 minutes counseling the patient face to face. The total time spent in the appointment was 25 minutes and more than 50% was on  counseling.     Truitt Merle, MD 11/01/2015 9:53 AM

## 2015-11-02 ENCOUNTER — Encounter: Payer: Self-pay | Admitting: Hematology

## 2015-11-02 ENCOUNTER — Ambulatory Visit: Payer: BLUE CROSS/BLUE SHIELD

## 2015-11-02 NOTE — Progress Notes (Signed)
form left in pod 11/01/15= left for dr Burr Medico to sign-faxed and mailed copy to patient-sent to medical recrds

## 2015-11-02 NOTE — Progress Notes (Signed)
form left in pod 11/01/15= left for dr Burr Medico to sign

## 2015-11-03 ENCOUNTER — Telehealth: Payer: Self-pay | Admitting: Hematology

## 2015-11-03 ENCOUNTER — Telehealth: Payer: Self-pay | Admitting: *Deleted

## 2015-11-03 ENCOUNTER — Ambulatory Visit (HOSPITAL_BASED_OUTPATIENT_CLINIC_OR_DEPARTMENT_OTHER): Payer: BLUE CROSS/BLUE SHIELD

## 2015-11-03 VITALS — BP 165/100 | HR 81 | Temp 98.6°F | Resp 18

## 2015-11-03 DIAGNOSIS — Z5189 Encounter for other specified aftercare: Secondary | ICD-10-CM

## 2015-11-03 DIAGNOSIS — C50511 Malignant neoplasm of lower-outer quadrant of right female breast: Secondary | ICD-10-CM | POA: Diagnosis not present

## 2015-11-03 MED ORDER — PEGFILGRASTIM INJECTION 6 MG/0.6ML ~~LOC~~
6.0000 mg | PREFILLED_SYRINGE | Freq: Once | SUBCUTANEOUS | Status: AC
  Administered 2015-11-03: 6 mg via SUBCUTANEOUS
  Filled 2015-11-03: qty 0.6

## 2015-11-03 NOTE — Telephone Encounter (Signed)
per of to sch pt appt-gave pt copy of avs °

## 2015-11-03 NOTE — Telephone Encounter (Signed)
Per staff message and POF I have scheduled appts. Advised scheduler of appts. JMW  

## 2015-11-03 NOTE — Patient Instructions (Signed)
Pegfilgrastim injection What is this medicine? PEGFILGRASTIM (PEG fil gra stim) is a long-acting granulocyte colony-stimulating factor that stimulates the growth of neutrophils, a type of white blood cell important in the body's fight against infection. It is used to reduce the incidence of fever and infection in patients with certain types of cancer who are receiving chemotherapy that affects the bone marrow, and to increase survival after being exposed to high doses of radiation. This medicine may be used for other purposes; ask your health care provider or pharmacist if you have questions. What should I tell my health care provider before I take this medicine? They need to know if you have any of these conditions: -kidney disease -latex allergy -ongoing radiation therapy -sickle cell disease -skin reactions to acrylic adhesives (On-Body Injector only) -an unusual or allergic reaction to pegfilgrastim, filgrastim, other medicines, foods, dyes, or preservatives -pregnant or trying to get pregnant -breast-feeding How should I use this medicine? This medicine is for injection under the skin. If you get this medicine at home, you will be taught how to prepare and give the pre-filled syringe or how to use the On-body Injector. Refer to the patient Instructions for Use for detailed instructions. Use exactly as directed. Take your medicine at regular intervals. Do not take your medicine more often than directed. It is important that you put your used needles and syringes in a special sharps container. Do not put them in a trash can. If you do not have a sharps container, call your pharmacist or healthcare provider to get one. Talk to your pediatrician regarding the use of this medicine in children. While this drug may be prescribed for selected conditions, precautions do apply. Overdosage: If you think you have taken too much of this medicine contact a poison control center or emergency room at  once. NOTE: This medicine is only for you. Do not share this medicine with others. What if I miss a dose? It is important not to miss your dose. Call your doctor or health care professional if you miss your dose. If you miss a dose due to an On-body Injector failure or leakage, a new dose should be administered as soon as possible using a single prefilled syringe for manual use. What may interact with this medicine? Interactions have not been studied. Give your health care provider a list of all the medicines, herbs, non-prescription drugs, or dietary supplements you use. Also tell them if you smoke, drink alcohol, or use illegal drugs. Some items may interact with your medicine. This list may not describe all possible interactions. Give your health care provider a list of all the medicines, herbs, non-prescription drugs, or dietary supplements you use. Also tell them if you smoke, drink alcohol, or use illegal drugs. Some items may interact with your medicine. What should I watch for while using this medicine? You may need blood work done while you are taking this medicine. If you are going to need a MRI, CT scan, or other procedure, tell your doctor that you are using this medicine (On-Body Injector only). What side effects may I notice from receiving this medicine? Side effects that you should report to your doctor or health care professional as soon as possible: -allergic reactions like skin rash, itching or hives, swelling of the face, lips, or tongue -dizziness -fever -pain, redness, or irritation at site where injected -pinpoint red spots on the skin -red or dark-brown urine -shortness of breath or breathing problems -stomach or side pain, or pain   at the shoulder -swelling -tiredness -trouble passing urine or change in the amount of urine Side effects that usually do not require medical attention (report to your doctor or health care professional if they continue or are  bothersome): -bone pain -muscle pain This list may not describe all possible side effects. Call your doctor for medical advice about side effects. You may report side effects to FDA at 1-800-FDA-1088. Where should I keep my medicine? Keep out of the reach of children. Store pre-filled syringes in a refrigerator between 2 and 8 degrees C (36 and 46 degrees F). Do not freeze. Keep in carton to protect from light. Throw away this medicine if it is left out of the refrigerator for more than 48 hours. Throw away any unused medicine after the expiration date. NOTE: This sheet is a summary. It may not cover all possible information. If you have questions about this medicine, talk to your doctor, pharmacist, or health care provider.    2016, [employer]vier/[employer] Standard. (2014-04-29 14:30:14)  

## 2015-11-07 ENCOUNTER — Encounter: Payer: Self-pay | Admitting: Physical Therapy

## 2015-11-07 ENCOUNTER — Other Ambulatory Visit: Payer: Self-pay | Admitting: Hematology

## 2015-11-07 ENCOUNTER — Ambulatory Visit: Payer: BLUE CROSS/BLUE SHIELD | Admitting: Physical Therapy

## 2015-11-07 ENCOUNTER — Ambulatory Visit (HOSPITAL_COMMUNITY)
Admission: RE | Admit: 2015-11-07 | Discharge: 2015-11-07 | Disposition: A | Discharge status: Home / Self Care | Payer: BLUE CROSS/BLUE SHIELD | Source: Ambulatory Visit | Attending: Hematology | Admitting: Hematology

## 2015-11-07 ENCOUNTER — Encounter (HOSPITAL_COMMUNITY): Payer: Self-pay

## 2015-11-07 DIAGNOSIS — C50511 Malignant neoplasm of lower-outer quadrant of right female breast: Secondary | ICD-10-CM | POA: Diagnosis not present

## 2015-11-07 DIAGNOSIS — M899 Disorder of bone, unspecified: Secondary | ICD-10-CM | POA: Diagnosis present

## 2015-11-07 DIAGNOSIS — I972 Postmastectomy lymphedema syndrome: Secondary | ICD-10-CM

## 2015-11-07 DIAGNOSIS — M25611 Stiffness of right shoulder, not elsewhere classified: Secondary | ICD-10-CM

## 2015-11-07 DIAGNOSIS — M25511 Pain in right shoulder: Secondary | ICD-10-CM | POA: Diagnosis not present

## 2015-11-07 MED ORDER — IOPAMIDOL (ISOVUE-300) INJECTION 61%
75.0000 mL | Freq: Once | INTRAVENOUS | Status: AC | PRN
  Administered 2015-11-07: 75 mL via INTRAVENOUS

## 2015-11-07 NOTE — Therapy (Signed)
[place], [place], [address] Phone: [phone]   Fax:  [phone]  Physical Therapy Treatment  Patient Details  Name: Melanie Moss MRN: [id number] Date of Birth: October 26, 1975 Referring Provider: Burr Medico  Encounter Date: 11/07/2015      PT End of Session - 11/07/15 1239    Visit Number 4   Number of Visits 17   Date for PT Re-Evaluation 12/15/15   PT Start Time 1107   PT Stop Time 1149   PT Time Calculation (min) 42 min   Activity Tolerance Patient tolerated treatment well   Behavior During Therapy [facility] for tasks assessed/performed      Past Medical History  Diagnosis Date  . Preterm labor   . Anemia   . Hyperlipidemia   . Gestational diabetes   . Breast cancer of lower-outer quadrant of right female breast ([place]) 07/29/2015  . Family history of breast cancer   . Family history of colon cancer   . Family history of prostate cancer   . Hypertension     not currently on medication  . Anxiety   . GERD (gastroesophageal reflux disease)     tums     Past Surgical History  Procedure Laterality Date  . Dilation and curettage of uterus    . Cervical cerclage  02/19/2011    Procedure: CERCLAGE CERVICAL;  Surgeon: Agnes Lawrence, MD;  Location: [facility];  Service: Gynecology;  Laterality: N/A;  . Mastectomy with axillary lymph node dissection Right 09/29/2015  . Simple mastectomy with axillary sentinel node biopsy Right 09/29/2015    Procedure: RIGHT TOTAL  MASTECTOMY WITH AXILLARY SENTINEL NODE BIOPSY;  Surgeon: Excell Seltzer, MD;  Location: [facility];  Service: General;  Laterality: Right;  . Portacath placement N/A 09/29/2015    Procedure: INSERTION PORT-A-CATH;  Surgeon: Excell Seltzer, MD;  Location: [facility];  Service: General;  Laterality: N/A;  . Axillary lymph node dissection Right 09/29/2015    Procedure: AXILLARY LYMPH NODE DISSECTION;  Surgeon: Excell Seltzer, MD;  Location: [facility];   Service: General;  Laterality: Right;  . Breast reconstruction with placement of tissue expander and flex hd (acellular hydrated dermis) Right 09/29/2015    Procedure: BREAST RECONSTRUCTION WITH PLACEMENT OF TISSUE EXPANDER AND FLEX HD (ACELLULAR HYDRATED DERMIS);  Surgeon: Loel Lofty Dillingham, DO;  Location: [facility];  Service: Plastics;  Laterality: Right;    There were no vitals filed for this visit.      Subjective Assessment - 11/07/15 1109    Subjective I have been tired. I am ok. I go Friday to get my expander filled again. It still feels tight.    Pertinent History June 8th right side mastectomy and lymph node dissection, pt starts taking chemo next Friday, pt has to see plastic surgeon for implant tomorrow, pt currently has an expander on R, pt to begin radiation after chemotherapy, (1) ER 95% positive, PR 95% positive, Ki-67 30%, HER-2 positive with ratio 2.04, copy # 5.20, (2) ER 90% positive, PR 95% positive, Ki-67 10%, HER-2 negative.   Patient Stated Goals to get as strong as I can as quick as I can   Currently in Pain? No/denies   Pain Score 0-No pain            [facility] PT Assessment - 11/07/15 0001    AROM   Right Shoulder ABduction 101 Degrees  [facility] PT Treatment/Exercise - 11/07/15 0001    Shoulder Exercises: Pulleys   Flexion 2 minutes   Flexion Limitations Min VC to decrease Rt scapular compensation.   ABduction 2 minutes   ABduction Limitations VC to decrease scapular compensation and Lt trunk side lean   Shoulder Exercises: Therapy Ball   Flexion 10 reps   Shoulder Exercises: ROM/Strengthening   Other ROM/Strengthening Exercises Finger Ladder for Rt UE abduction 5 times (got up to #22)   Manual Therapy   Soft tissue mobilization To Rt anterior elbow and axillaat cording   Manual Lymphatic Drainage (MLD) In Supine: Short neck, 5 diaphragmatic breaths, Rt inguinal nodes, Rt axillo-inguinal anastomosis and Rt UE from dorsal hand  to lateral shoulder working proximal to distal then retracing all steps.    Passive ROM In Supine to Rt shoulder into gentle flexion, abduction, er, and D2 to pts tolerance.                    Short Term Clinic Goals - 10/31/15 1213    CC Short Term Goal  #1   Title Pt to demonstrate 100 degrees of right shoulder flexion to allow her to reach items overhead.    Baseline 71, 108 degrees 10/1015   Status Achieved   CC Short Term Goal  #2   Title Pt to demonstrate 52 degrees of right shoulder abduction to allow pt to reach out to sides.   Baseline 52, 90 degrees 10/31/15   Status Achieved   CC Short Term Goal  #3   Title Pt will be independent in stating lymphedema risk reduction practices to decrease risk of lymphedema   Status Partially [place] Goals - 10/31/15 1214    CC Long Term Goal  #1   Title Pt will receive appropriate compression garment for management and to decrease risk of RUE edema   Status On-going   CC Long Term Goal  #2   Title Pt will demonstrate 165 degrees of right shoulder flexion to allow pt to reach items overhead   Baseline 71, 108 degrees 10/31/15   Status On-going   CC Long Term Goal  #3   Title Pt will demonstrate 165 degrees of shoulder abduction to allow pt to reach items out to sides   Baseline 52, 90 degrees 10/31/15   Status On-going   CC Long Term Goal  #4   Title Pt will demonstrate at least 50 degrees of right shoulder external rotation to all her to return to prior level of function   Baseline unable to assess due to pain, 67 degrees 10/31/15   Status Achieved   CC Long Term Goal  #5   Title Pt will be independent in a home exercise program for continued strengthening and stretching    Status On-going   CC Long Term Goal  #6   Title Pt will improve Quick Dash impairment percentage to less than or equal to 20 percent for improved functional use of RUE   Status On-going            Plan - 11/07/15 1239     Clinical Impression Statement Pt tolerated myofascial release to cording today very well. Following myofascial release pt was able to attain full right shoulder abduction with PROM which she was not able to do at the beginning of the session. Her right shoulder AROM improved 10 degrees today. She continues  to be limited with AROM secondary to cording in right arm to forearm.    Rehab Potential Excellent   Clinical Impairments Affecting Rehab Potential to begin chemo 10/28/15   PT Frequency 2x / week   PT Duration 8 weeks   PT Treatment/Interventions Manual lymph drainage;Manual techniques;Therapeutic exercise;Vasopneumatic Device;Taping;Passive range of motion;Patient/family education;DME Instruction;ADLs/Self Care Home Management;Scar mobilization;Compression bandaging;Electrical Stimulation   PT Next Visit Plan Cont AAROM/PROM/AROM of Rt shoulder, add to HEP and issue handout for lymphedema risk reduction. Begin IFC once recert is signed by Dr. Burr Medico for muscle relaxation which though improved, pt still struggles with.     PT Home Exercise Plan Cont supine dowel shoulder exercises and add neural tension stretch on wall   Consulted and Agree with Plan of Care Patient      Patient will benefit from skilled therapeutic intervention in order to improve the following deficits and impairments:  Decreased range of motion, Pain, Impaired UE functional use, Increased fascial restricitons, Decreased strength, Decreased knowledge of use of DME, Decreased knowledge of precautions, Decreased scar mobility, Increased edema  Visit Diagnosis: Pain in right shoulder  Stiffness of right shoulder, not elsewhere classified  Postmastectomy lymphedema     Problem List Patient Active Problem List   Diagnosis Date Noted  . Genetic testing 08/16/2015  . Iron deficiency anemia due to chronic blood loss 08/03/2015  . Family history of breast cancer   . Family history of colon cancer   . Family history of  prostate cancer   . Breast cancer of lower-outer quadrant of right female breast ([place]) 07/29/2015  . Leiomyoma of uterus, unspecified 08/01/2012  . Irregular menstrual cycle 08/01/2012  . 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  . Anemia 12/22/2010  . Acid reflux 12/22/2010    Alexia Freestone 11/07/2015, 12:41 PM  [place], [place], [address] Phone: [phone]   Fax:  [phone]  Name: Melanie Moss MRN: [id number] Date of Birth: 1975-05-17    Allyson Sabal, PT 11/07/2015 12:41 PM

## 2015-11-08 ENCOUNTER — Other Ambulatory Visit: Payer: BLUE CROSS/BLUE SHIELD

## 2015-11-08 ENCOUNTER — Encounter: Payer: BLUE CROSS/BLUE SHIELD | Admitting: Hematology

## 2015-11-08 ENCOUNTER — Encounter: Payer: Self-pay | Admitting: Hematology

## 2015-11-08 ENCOUNTER — Telehealth: Payer: Self-pay

## 2015-11-08 NOTE — Telephone Encounter (Addendum)
Pt called with sore throat, flu like symptoms of aching all over the body, "can't get my head off the pillow". S/w Dr Burr Medico and called pt back. Pt developed aches after she received neulasta on 7/13. Started on Saturday. Denies fever, denies sputum. Explained she may be in her nadir on lab work. Pt is using claritin, and norco. She has tried ibuprofen. Suggested hot shower or bath. Pt requested to r/s. She will call tomorrow with update. Instructed her to call anytime if she develops a fever or sx of bacterial infection.

## 2015-11-08 NOTE — Progress Notes (Signed)
This encounter was created in error - please disregard.

## 2015-11-09 ENCOUNTER — Encounter: Payer: Self-pay | Admitting: Hematology

## 2015-11-09 ENCOUNTER — Other Ambulatory Visit: Payer: Self-pay | Admitting: *Deleted

## 2015-11-09 ENCOUNTER — Ambulatory Visit: Payer: BLUE CROSS/BLUE SHIELD

## 2015-11-09 ENCOUNTER — Ambulatory Visit (HOSPITAL_BASED_OUTPATIENT_CLINIC_OR_DEPARTMENT_OTHER): Payer: BLUE CROSS/BLUE SHIELD | Admitting: Hematology

## 2015-11-09 ENCOUNTER — Ambulatory Visit (HOSPITAL_BASED_OUTPATIENT_CLINIC_OR_DEPARTMENT_OTHER): Payer: BLUE CROSS/BLUE SHIELD

## 2015-11-09 VITALS — BP 151/98 | HR 100 | Temp 98.5°F | Resp 17 | Ht 63.0 in | Wt 145.3 lb

## 2015-11-09 DIAGNOSIS — C50511 Malignant neoplasm of lower-outer quadrant of right female breast: Secondary | ICD-10-CM | POA: Diagnosis not present

## 2015-11-09 DIAGNOSIS — D509 Iron deficiency anemia, unspecified: Secondary | ICD-10-CM | POA: Diagnosis not present

## 2015-11-09 DIAGNOSIS — Z170 Estrogen receptor positive status [ER+]: Secondary | ICD-10-CM

## 2015-11-09 DIAGNOSIS — C773 Secondary and unspecified malignant neoplasm of axilla and upper limb lymph nodes: Secondary | ICD-10-CM

## 2015-11-09 DIAGNOSIS — R21 Rash and other nonspecific skin eruption: Secondary | ICD-10-CM

## 2015-11-09 DIAGNOSIS — I10 Essential (primary) hypertension: Secondary | ICD-10-CM

## 2015-11-09 DIAGNOSIS — D500 Iron deficiency anemia secondary to blood loss (chronic): Secondary | ICD-10-CM

## 2015-11-09 LAB — CBC WITH DIFFERENTIAL/PLATELET
BASO%: 0.3 % (ref 0.0–2.0)
Basophils Absolute: 0.1 10*3/uL (ref 0.0–0.1)
EOS%: 0.1 % (ref 0.0–7.0)
Eosinophils Absolute: 0 10*3/uL (ref 0.0–0.5)
HCT: 36.4 % (ref 34.8–46.6)
HGB: 11.8 g/dL (ref 11.6–15.9)
LYMPH%: 12.8 % — ABNORMAL LOW (ref 14.0–49.7)
MCH: 28.1 pg (ref 25.1–34.0)
MCHC: 32.4 g/dL (ref 31.5–36.0)
MCV: 87 fL (ref 79.5–101.0)
MONO#: 2.7 10*3/uL — ABNORMAL HIGH (ref 0.1–0.9)
MONO%: 7 % (ref 0.0–14.0)
NEUT#: 30.5 10*3/uL — ABNORMAL HIGH (ref 1.5–6.5)
NEUT%: 79.8 % — ABNORMAL HIGH (ref 38.4–76.8)
Platelets: 233 10*3/uL (ref 145–400)
RBC: 4.18 10*6/uL (ref 3.70–5.45)
RDW: 13.9 % (ref 11.2–14.5)
WBC: 38.3 10*3/uL — ABNORMAL HIGH (ref 3.9–10.3)
lymph#: 4.9 10*3/uL — ABNORMAL HIGH (ref 0.9–3.3)

## 2015-11-09 LAB — COMPREHENSIVE METABOLIC PANEL
ALT: 20 U/L (ref 0–55)
AST: 32 U/L (ref 5–34)
Albumin: 3.9 g/dL (ref 3.5–5.0)
Alkaline Phosphatase: 107 U/L (ref 40–150)
Anion Gap: 9 mEq/L (ref 3–11)
BUN: 5.2 mg/dL — ABNORMAL LOW (ref 7.0–26.0)
CO2: 28 mEq/L (ref 22–29)
Calcium: 9.8 mg/dL (ref 8.4–10.4)
Chloride: 104 mEq/L (ref 98–109)
Creatinine: 0.9 mg/dL (ref 0.6–1.1)
EGFR: >90 mL/min/{1.73_m2} (ref >90)
Glucose: 124 mg/dl (ref 70–140)
Potassium: 3.6 mEq/L (ref 3.5–5.1)
Sodium: 141 mEq/L (ref 136–145)
Total Bilirubin: <0.3 mg/dL (ref 0.20–1.20)
Total Protein: 7.1 g/dL (ref 6.4–8.3)

## 2015-11-09 MED ORDER — CLINDAMYCIN PHOSPHATE 1 % EX GEL: Freq: Two times a day (BID) | CUTANEOUS | Status: DC

## 2015-11-09 MED ORDER — HYDROCORTISONE 2.5 % EX CREA: TOPICAL_CREAM | Freq: Two times a day (BID) | CUTANEOUS | Status: DC

## 2015-11-09 NOTE — Progress Notes (Signed)
[facility]  [phone] [phone]  Clinic Follow Up Note   Patient Care Team: Renaye Rakers, MD as PCP - General (Family Medicine) Glenna Fellows, MD as Consulting Physician (General Surgery) Lurline Hare, MD as Consulting Physician (Radiation Oncology) 11/09/2015  CHIEF COMPLAINTS:  Follow up right breast cancer  Oncology History   Breast cancer of lower-outer quadrant of right female breast [facility])   Staging form: Breast, AJCC 7th Edition     Clinical stage from 08/03/2015: Stage IA (T1c, N0, M0) - Unsigned     Pathologic stage from 09/29/2015: Stage IIIA (T3(m), N1a, cM0) - Signed by Malachy Mood, MD on 10/17/2015         Breast cancer of lower-outer quadrant of right female breast (HCC)   07/25/2015 Mammogram diagnostic mammogram and ultrasound showed a 11 mm mass in the 8:00 position of the right breast, small irregular lesion in the retroareolar regio of the right breast, contiguous with the dermis   07/26/2015 Initial Diagnosis Breast cancer of lower-outer quadrant of right female breast (HCC)   07/26/2015 Initial Biopsy right breast needle core biopsy at 8:00 position (1), invasive ductal carcinoma, grade 3, 6:30 o'clock position invasive (2) and in situ ductal carcinoma, grade 1-2, lymphovascular invasion is identified   07/26/2015 Receptors her2 (1) ER 95% positive, PR 95% positive, Ki-67 30%, HER-2 positive with ratio 2.04, copy # 5.20, (2) ER 90% positive, PR 95% positive, Ki-67 10%, HER-2 negative.   08/02/2015 Imaging breast MRI showed extensive mass  and non-mass enhancement in the lower outer quadrant of the right breast  spanning 13 cm, focal enhancement within the right nipple, at least 3-4 abnormal appearing right axillary lymph node.   09/29/2015 Surgery right mastectomy and axillar node dissection    09/29/2015 Pathology Results Right mastectomy and axillary node dissection showed multifocal mammary carcinoma with ductal and lobular features,  tumor size 13 cm, tubal 1 cm, 1.9 cm, 2.5 cm, grade 2-3, anterior margins were positive, 2 nodes with metastasis and one with isolated cells   11/01/2015 -  Chemotherapy     HISTORY OF PRESENTING ILLNESS (08/03/2015):  Melanie Moss 40 y.o. female is here because of Her newly diagnosed right breast cancer. She is accompanied by her fianc, sister and mother to our multidisciplinary breast clinic today.  She felt a breast lump and noticed mild intermittent breast pain 2 weeks ago, no nipple discharge or skin change. She also has moderate fatigue and dyspnea on moderate to heavy exertion for a few months, no weight loss, no other pain or other symptoms.  She had normal mammgram 6 years ago, for a small lump which felt to be benign and no biopsy was done.    She has iregular period, twice a month, it lasts about 5-7 days, heavy for 3 days, she chanes 6-7 times a day. She was found to be anemic recently, and has started taking oral iron pill a few weeks ago, tolerates well.  GYN HISTORY  Menarchal: 10  LMP: 07/29/2015 Contraceptive: no  HRT: n/a  G4P1: 3 miscarrage, one 78 yo son, no plan to have more children   CURRENT THERAPY: Adjuvant chemo TCHP every 3 weeks, started on 11/01/2015  INTERIM HISTORY: Melanie Moss returns for follow-up And toxicity check After her first cycle chemotherapy. She tolerated infusion very well, and had Neulasta injection on day 3. She had a mild nausea a few times, no vomiting. Slightly decreased appetite. She also noticed scattered skin rash on her upper chest,  no itching or painful. She was doing well until 2 days ago, she developed significant fatigue, back pain or tightness, and low appetite, no fever, chill, or other new complaints. She was extremely fatigued yesterday and could not get out of bed. She has been able to drink fluids adequately, but did not eat much yesterday. She took some Tylenol, and rested whole day yesterday. She feels much better this morning, the back  pain near resolved, able to eat and drink.  no other new complaints.  MEDICAL HISTORY:  Past Medical History  Diagnosis Date  . Preterm labor   . Anemia   . Hyperlipidemia   . Gestational diabetes   . Breast cancer of lower-outer quadrant of right female breast ([place]) 07/29/2015  . Family history of breast cancer   . Family history of colon cancer   . Family history of prostate cancer   . Hypertension     not currently on medication  . Anxiety   . GERD (gastroesophageal reflux disease)     tums     SURGICAL HISTORY: Past Surgical History  Procedure Laterality Date  . Dilation and curettage of uterus    . Cervical cerclage  02/19/2011    Procedure: CERCLAGE CERVICAL;  Surgeon: Agnes Lawrence, MD;  Location: [facility];  Service: Gynecology;  Laterality: N/A;  . Mastectomy with axillary lymph node dissection Right 09/29/2015  . Simple mastectomy with axillary sentinel node biopsy Right 09/29/2015    Procedure: RIGHT TOTAL  MASTECTOMY WITH AXILLARY SENTINEL NODE BIOPSY;  Surgeon: Excell Seltzer, MD;  Location: [place];  Service: General;  Laterality: Right;  . Portacath placement N/A 09/29/2015    Procedure: INSERTION PORT-A-CATH;  Surgeon: Excell Seltzer, MD;  Location: [place];  Service: General;  Laterality: N/A;  . Axillary lymph node dissection Right 09/29/2015    Procedure: AXILLARY LYMPH NODE DISSECTION;  Surgeon: Excell Seltzer, MD;  Location: [place];  Service: General;  Laterality: Right;  . Breast reconstruction with placement of tissue expander and flex hd (acellular hydrated dermis) Right 09/29/2015    Procedure: BREAST RECONSTRUCTION WITH PLACEMENT OF TISSUE EXPANDER AND FLEX HD (ACELLULAR HYDRATED DERMIS);  Surgeon: Loel Lofty Dillingham, DO;  Location: [place];  Service: Plastics;  Laterality: Right;    SOCIAL HISTORY: Social History   Social History  . Marital Status: Single    Spouse Name: N/A  . Number of Children: 1  . Years of Education: N/A   Occupational  History  . Not on file.   Social History Main Topics  . Smoking status: Never Smoker   . Smokeless tobacco: Never Used  . Alcohol Use: No  . Drug Use: No  . Sexual Activity: Yes   Other Topics Concern  . Not on file   Social History Narrative    FAMILY HISTORY: Family History  Problem Relation Age of Onset  . Hypertension Mother   . Hypertension Father   . Prostate cancer Father 58  . Breast cancer Maternal Aunt 3  . Breast cancer Paternal Aunt 1  . Cirrhosis Maternal Uncle   . Prostate cancer Paternal Uncle   . Throat cancer Maternal Aunt     non smoker  . Prostate cancer Paternal Uncle   . Colon cancer Cousin     maternal first cousin dx in his 13s  . Lung cancer Cousin     smoker    ALLERGIES:  has No Known Allergies.  MEDICATIONS:  Current Outpatient Prescriptions  Medication Sig Dispense Refill  .  B Complex-C (SUPER B COMPLEX PO) Take 1 tablet by mouth daily.    . diazepam (VALIUM) 2 MG tablet Take 2 mg by mouth every 6 (six) hours as needed for anxiety.   0  . diphenhydrAMINE (BENADRYL) 12.5 MG/5ML elixir Take 5 mLs (12.5 mg total) by mouth every 6 (six) hours as needed for itching. 120 mL 0  . ferrous sulfate 325 (65 FE) MG tablet Take 325 mg by mouth 2 (two) times daily with a meal.    . HYDROcodone-acetaminophen (NORCO/VICODIN) 5-325 MG tablet Take 1 tablet by mouth every 6 (six) hours as needed for severe pain. Reported on 11/01/2015 30 tablet 0  . ibuprofen (ADVIL,MOTRIN) 200 MG tablet Take 200 mg by mouth every 6 (six) hours as needed.    . lidocaine-prilocaine (EMLA) cream Apply to affected area once 30 g 3  . Multiple Vitamin (MULTIVITAMIN) tablet Take 1 tablet by mouth daily.    . ondansetron (ZOFRAN) 8 MG tablet Take 1 tablet (8 mg total) by mouth 2 (two) times daily as needed for refractory nausea / vomiting. Start on day 3 after chemo. 30 tablet 1  . ondansetron (ZOFRAN-ODT) 4 MG disintegrating tablet Take 4 mg by mouth every 8 (eight) hours as  needed for nausea or vomiting. Reported on 11/01/2015  0  . polyethylene glycol (MIRALAX / GLYCOLAX) packet Take 17 g by mouth daily as needed for mild constipation. 14 each 0  . prochlorperazine (COMPAZINE) 10 MG tablet Take 1 tablet (10 mg total) by mouth every 6 (six) hours as needed (Nausea or vomiting). 30 tablet 1  . senna (SENOKOT) 8.6 MG TABS tablet Take 1 tablet (8.6 mg total) by mouth 2 (two) times daily. 120 each 0   No current facility-administered medications for this visit.    REVIEW OF SYSTEMS:   Constitutional: Denies fevers, chills or abnormal night sweats Eyes: Denies blurriness of vision, double vision or watery eyes Ears, nose, mouth, throat, and face: Denies mucositis or sore throat Respiratory: Denies cough, dyspnea or wheezes Cardiovascular: Denies palpitation, chest discomfort or lower extremity swelling Gastrointestinal:  Denies nausea, heartburn or change in bowel habits Skin: Denies abnormal skin rashes Lymphatics: Denies new lymphadenopathy or easy bruising Neurological:Denies numbness, tingling or new weaknesses Behavioral/Psych: Mood is stable, no new changes  All other systems were reviewed with the patient and are negative.  PHYSICAL EXAMINATION: ECOG PERFORMANCE STATUS: 0   Filed Vitals:   11/09/15 1246 11/09/15 1247  BP: 163/104 151/98  Pulse: 105 100  Temp: 98.5 F (36.9 C)   Resp: 17    Filed Weights   11/09/15 1242 11/09/15 1246  Weight: 145 lb 4.8 oz (65.908 kg) 145 lb 4.8 oz (65.908 kg)    GENERAL:alert, no distress and comfortable SKIN: skin color, texture, turgor are normal, Several scattered acne-like rash in her upper front chest.  EYES: normal, conjunctiva are pink and non-injected, sclera clear OROPHARYNX:no exudate, no erythema and lips, buccal mucosa, and tongue normal  NECK: supple, thyroid normal size, non-tender, without nodularity LYMPH:  no palpable lymphadenopathy in the cervical, axillary or inguinal LUNGS: clear to  auscultation and percussion with normal breathing effort HEART: regular rate & rhythm and no murmurs and no lower extremity edema ABDOMEN:abdomen soft, non-tender and normal bowel sounds Musculoskeletal:no cyanosis of digits and no clubbing  PSYCH: alert & oriented x 3 with fluent speech NEURO: no focal motor/sensory deficits Breasts: Breast inspection showed them to be symmetrical with no skin change or nipple discharge.  Status post  right mastectomy, surgical scar has healed well, no significant discharge or skin erythema. Palpation of the left breast and bilateral axilla showed no palpable mass.  LABORATORY DATA:  I have reviewed the data as listed CBC Latest Ref Rng 11/09/2015 11/01/2015 09/29/2015  WBC 3.9 - 10.3 10e3/uL 38.3(H) 6.6 14.8(H)  Hemoglobin 11.6 - 15.9 g/dL 11.8 12.5 11.0(L)  Hematocrit 34.8 - 46.6 % 36.4 37.6 35.0(L)  Platelets 145 - 400 10e3/uL 233 283 312    CMP Latest Ref Rng 11/09/2015 11/01/2015 09/29/2015  Glucose 70 - 140 mg/dl 124 96 203(H)  BUN 7.0 - 26.0 mg/dL 5.2(L) 8.5 6  Creatinine 0.6 - 1.1 mg/dL 0.9 0.8 0.73  Sodium 136 - 145 mEq/L 141 140 133(L)  Potassium 3.5 - 5.1 mEq/L 3.6 3.5 3.6  Chloride 101 - 111 mmol/L - - 101  CO2 22 - 29 mEq/L '28 23 23  '$ Calcium 8.4 - 10.4 mg/dL 9.8 9.6 8.6(L)  Total Protein 6.4 - 8.3 g/dL 7.1 7.5 -  Total Bilirubin 0.20 - 1.20 mg/dL <0.30 <0.30 -  Alkaline Phos 40 - 150 U/L 107 59 -  AST 5 - 34 U/L 32 22 -  ALT 0 - 55 U/L 20 13 -    PATHOLOGY: Diagnosis 09/29/2015 1. Lymph node, sentinel, biopsy, Right axillary #1 - ONE BENIGN LYMPH NODE WITH NO TUMOR SEEN (0/1). 2. Lymph node, sentinel, biopsy, Right axillary #2 - ONE LYMPH NODE POSITIVE FOR METASTATIC MAMMARY CARCINOMA (1/1). 3. Lymph node, sentinel, biopsy, Right axillary #4 - ONE BENIGN LYMPH NODE WITH NO TUMOR SEEN (0/1). 4. 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 5. Breast, simple mastectomy,  Right breast-suture marks axillary tail - MULTIFOCAL MAMMARY CARCINOMA WITH DUCTAL AND LOBULAR FEATURES. - LARGEST FOCUS OF INVASIVE MAMMARY CARCINOMA IS GRADE 2, SHOWS ASSOCIATED LOBULAR CARCINOMA IN SITU AND MEASURES 13 CM IN GREATEST DIMENSION. - SECOND LARGEST FOCUS SHOWS INVASIVE GRADE 3 MAMMARY CARCINOMA AND SPANS 2.1 CM IN GREATEST DIMENSION. - THIRD LARGEST FOCUS SHOWS INVASIVE GRADE 2 MAMMARY CARCINOMA WITH LOBULAR CARCINOMA IN SITU AND MEASURES 1.9 CM IN GREATEST DIMENSION. - SMALLEST FOCUS SHOWS INVASIVE GRADE 3 MAMMARY CARCINOMA AND MEASURES 0.5 CM IN GREATEST DIMENSION. - ANTERIOR INFERIOR SOFT TISSUE MARGIN DEMONSTRATES BROAD POSITIVITY FOR INVASIVE MAMMARY CARCINOMA. - OTHER MARGINS ARE NEGATIVE. - SEE ONCOLOGY TEMPLATE. 6. Lymph node, sentinel, biopsy, Right axillary #3 - ONE BENIGN LYMPH NODE WITH NO TUMOR SEEN (0/1). 7. Lymph node, sentinel, biopsy, Right axillary #6 - ONE LYMPH NODE WITH ISOLATED TUMOR CELLS. 8. Lymph nodes, regional resection, Right axillary contents - ELEVEN BENIGN LYMPH NODES WITH NO TUMOR SEEN (0/11). Specimen, including laterality and lymph node sampling (sentinel, non-sentinel): Right breast with right sentinel and non-sentinel lymph nodes. Procedure: Right modified mastectomy with sentinel lymph node biopsy and axillary dissection. Histologic type: Multifocal mammary carcinoma with ductal and lobular features. Largest focus (13 cm): Grade: 2. Tubule formation: 3. Nuclear pleomorphism: 2. Mitotic: 1. Second largest focus (2.1 cm): Grade: 3. Tubule formation: 3. Nuclear pleomorphism: 2. Mitotic: 3. Third largest focus (1.9 cm): Grade: 2. Tubule formation: 3. Nuclear pleomorphism: 2. Mitotic: 1. Smallest focus (0.5 cm): Grade: 3. Tubule formation: 3. Nuclear pleomorphism: 2. Mitotic: 3. Tumor size (gross measurements): 13 cm, 2.1 cm, 1.9 cm and 0.5 cm. Margins: Invasive, distance to closest margin: Invasive mammary carcinoma  broadly involves the anterior inferior soft tissue margin. In-situ, distance to closest margin: Margin uninvolved by in situ carcinoma. If margin  positive, focally or broadly: Broadly positive. Lymphovascular invasion: Yes, identified. Ductal carcinoma in situ: Not identified. Grade: Not applicable. Extensive intraductal component: Not applicable. Lobular neoplasia: Yes, lobular carcinoma in situ is present. Tumor focality: Multifocal. Treatment effect: Not applicable. If present, treatment effect in breast tissue, lymph nodes or both: Not applicable. Extent of tumor: Skin: Not involved. Nipple: Not involved. Skeletal muscle: Not received. Lymph nodes: Examined: 6 Sentinel. 11 Non-sentinel. 17 Total. Lymph nodes with metastasis: 2 with metastasis and 1 with isolated tumor cells. Isolated tumor cells (< 0.2 mm): 1. Micrometastasis: (> 0.2 mm and < 2.0 mm): 0. Macrometastasis: (> 2.0 mm): 2. Extracapsular extension: Not identified.  Breast prognostic profile: Performed on previous case, [id number]. Right 8 o'clock biopsy 12 cm from the nipple: Estrogen receptor: 95%, positive. Progesterone receptor: 95%, positive. Her-2 neu: 2.04 ratio, positive. Ki-67: 30%. Right breast needle core biopsy 6:30 o'clock position 1 cm from the nipple: Estrogen receptor: 90%, positive. Progesterone receptor: 95%, positive. Her-2 neu: 1.39 ratio, negative. Ki-67: 10%. Non-neoplastic breast: No significant non-neoplastic breast findings.  TNM: pT3(m), pN1a. Comments: A cytokeratin AE1/AE3 immunohistochemical stain is performed on eight blocks containing lymph node tissue (eight stains total). The morphology coupled with the staining pattern is consistent with the above findings. An E-cadherin immunohistochemical stain is also performed on four blocks from the right simple mastectomy specimen. The staining pattern coupled with the morphology is consistent with the above findings. As there  was Her-2 neu positivity in one of the biopsies and both biopsies were positive for estrogen receptor, a repeat breast prognostic profile will not be performed unless otherwise requested. Dr. Lyndon Code has seen selected slides 5E, 69F, 5G, 5J, 5N, 5O, and 5P with agreement that the tumor is a mulitfocal invasive mammary carcinoma with a mixed ductal and lobular phenotype. Dr. Tresa Moore has also seen a selected slide (5P) with agreement of the positive anterior inferior margin. (RH:ecj 10/03/2015)   RADIOGRAPHIC STUDIES: I have personally reviewed the radiological images as listed and agreed with the findings in the report. No new scans  Bone scan 10/28/2015 IMPRESSION: Small focus of increased radiotracer uptake within the right lateral skull at the region of the right temporal bone. CT of the head may be considered for further evaluation.  Otherwise no evidence of osseous metastatic disease in the axial skeleton.  CT chest, abdomen and pelvis  10/27/2015 IMPRESSION: Status post right mastectomy and right axillary lymph node dissection.  No findings specific for metastatic disease.  Scattered hepatic cysts measuring up to 12 mm. A single 8 mm hypoenhancing lesion is technically indeterminate, but also likely benign, although motion degraded. Consider attention on follow-up.  CT head with and without contrast 11/07/2015 IMPRESSION: 1. Indeterminate 10-11 mm area of heterogeneous bone mineralization along the right sphenoid wing which may correspond to the recent bone scan finding. This might be physiologic. A Repeat CT head (e.g. in 8-12 weeks, Noncontrast should suffice but with and without contrast could be done if repeat brain staging is desired) may be most valuable to evaluate stability. 2. No CT evidence of metastatic disease to the brain. Mild nonspecific white matter changes.   ASSESSMENT & PLAN:  40 year old African-American female, premenopausal, presented with a palpable  right breast mass.  1. 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 Her initial biopsy showed 1 triple positive disease, the other was ER/PR positive and PR negative disease. -Given her stage III disease, I recommend staging CT chest, abdomen and pelvis with contrast, and bone scan to ruled out distant metastasis. -We discussed that HER-2 positive breast cancer are more aggressive, with a high risk for recurrence after surgery, especially in the setting of locally advanced stage. I recommend her to have adjuvant chemotherapy TCHP (docetaxel, carboplatin, Herceptin, pejeta) every 3 weeks for total of 6 cycles, followed by Herceptin maintenance therapy to complete 1 year treatment. -Her baseline echo was normal, which was done in April 2017, she will follow-up with cardiologist to monitor her heart during her and her to therapy -I discussed her staging CT and bone scan results, showed no definitive evidence of metastasis. This mild uptake in the right lateral skull on bone scan, and I got a CT head for further evaluation, which showed a small area of heterogeneous bone mineralization, corresponding to the bone scan findings. This is likely physiological. -She had severe back pain and fatigue on day 6 after chemo, probably related to her Neulasta injection, I'll hold it for his next cycle chemotherapy. -Lab reviewed, she has significant leukocytosis likely secondary to Neulasta, no significant other abnormalities.  2. Iron deficient anemia -Her lab tests showed a moderate anemia with hemoglobin 7.8, low MCV 62.7, her serum iron level and transferrin saturation on low, ferritin 25, this is consistent with  iron deficient anemia, likely secondary to her menorrhagia. -Her anemia has resolved after IV Feraheme. -I encouraged her to continue oral iron pill  3. Genetic -Given her young age and positive family history, we strongly recommend her to [facility] genetic counseling for genetic testing to ruled out inheritable breast cancer syndrome. She agrees. -Her genetic testing was normal  4. Breast pain from tissue expander -Much better lately. She is due for tissue expander inflation again this Friday.  5. HTN -She was noticed to have significant elevated blood pressure today, asymptomatic. -I strongly her to check her blood pressure daily in the next few days, and call his primary care physician if she notices persistent hypertension   6. Skin rash -Secondary to Herceptin in the pejeta -I'll give her 2.5% hydrocortisone and 1% clindamycin gel for her to use twice a day  Plan -CT head findings reviewed with pt -She will return in 2 weeks for cycle 2 chemotherapy, she will see APP Lattie Haw -will hold Neulasta for cycle 2 due to her significant bone pain and fatigue  -I'll call in hydrocortisone and clindamycin gel today  All questions were answered. The patient and her family members agree with the above plan. The patient knows to call the clinic with any problems, questions or concerns.  I spent 20 minutes counseling the patient face to face. The total time spent in the appointment was 25 minutes and more than 50% was on counseling.     Truitt Merle, MD 11/09/2015 2:30 PM

## 2015-11-14 ENCOUNTER — Ambulatory Visit: Payer: BLUE CROSS/BLUE SHIELD

## 2015-11-14 DIAGNOSIS — N651 Disproportion of reconstructed breast: Secondary | ICD-10-CM | POA: Insufficient documentation

## 2015-11-16 ENCOUNTER — Encounter: Payer: Self-pay | Admitting: Physical Therapy

## 2015-11-16 ENCOUNTER — Ambulatory Visit: Payer: BLUE CROSS/BLUE SHIELD | Admitting: Physical Therapy

## 2015-11-16 DIAGNOSIS — M25511 Pain in right shoulder: Secondary | ICD-10-CM | POA: Diagnosis not present

## 2015-11-16 DIAGNOSIS — M25611 Stiffness of right shoulder, not elsewhere classified: Secondary | ICD-10-CM

## 2015-11-16 NOTE — Therapy (Signed)
[place], [place], [address] Phone: [phone]   Fax:  [phone]  Physical Therapy Treatment  Patient Details  Name: Melanie Moss MRN: [id number] Date of Birth: [birth year] Referring Provider: Burr Medico  Encounter Date: 11/16/2015      PT End of Session - 11/16/15 1200    Visit Number 5   Number of Visits 17   Date for PT Re-Evaluation 12/15/15   PT Start Time 1106   PT Stop Time 1150   PT Time Calculation (min) 44 min   Activity Tolerance Patient tolerated treatment well   Behavior During Therapy [facility] for tasks assessed/performed      Past Medical History:  Diagnosis Date  . Anemia   . Anxiety   . Breast cancer of lower-outer quadrant of right female breast ([place]) 07/29/2015  . Family history of breast cancer   . Family history of colon cancer   . Family history of prostate cancer   . GERD (gastroesophageal reflux disease)    tums   . Gestational diabetes   . Hyperlipidemia   . Hypertension    not currently on medication  . Preterm labor     Past Surgical History:  Procedure Laterality Date  . AXILLARY LYMPH NODE DISSECTION Right 09/29/2015   Procedure: AXILLARY LYMPH NODE DISSECTION;  Surgeon: Excell Seltzer, MD;  Location: [place];  Service: General;  Laterality: Right;  . BREAST RECONSTRUCTION WITH PLACEMENT OF TISSUE EXPANDER AND FLEX HD (ACELLULAR HYDRATED DERMIS) Right 09/29/2015   Procedure: BREAST RECONSTRUCTION WITH PLACEMENT OF TISSUE EXPANDER AND FLEX HD (ACELLULAR HYDRATED DERMIS);  Surgeon: Loel Lofty Dillingham, DO;  Location: [place];  Service: Plastics;  Laterality: Right;  . CERVICAL CERCLAGE  02/19/2011   Procedure: CERCLAGE CERVICAL;  Surgeon: Agnes Lawrence, MD;  Location: [facility];  Service: Gynecology;  Laterality: N/A;  . DILATION AND CURETTAGE OF UTERUS    . MASTECTOMY WITH AXILLARY LYMPH NODE DISSECTION Right 09/29/2015  . PORTACATH PLACEMENT N/A 09/29/2015   Procedure: INSERTION PORT-A-CATH;  Surgeon: Excell Seltzer, MD;  Location: [place];  Service: General;  Laterality: N/A;  . SIMPLE MASTECTOMY WITH AXILLARY SENTINEL NODE BIOPSY Right 09/29/2015   Procedure: RIGHT TOTAL  MASTECTOMY WITH AXILLARY SENTINEL NODE BIOPSY;  Surgeon: Excell Seltzer, MD;  Location: [place];  Service: General;  Laterality: Right;    There were no vitals filed for this visit.      Subjective Assessment - 11/16/15 1109    Subjective I'm ok. I haven't really been doing my exercises. I have been really busy. I am having pain in the front part of my elbow.    Pertinent History June 8th right side mastectomy and lymph node dissection, pt starts taking chemo next Friday, pt has to see plastic surgeon for implant tomorrow, pt currently has an expander on R, pt to begin radiation after chemotherapy, (1) ER 95% positive, PR 95% positive, Ki-67 30%, HER-2 positive with ratio 2.04, copy # 5.20, (2) ER 90% positive, PR 95% positive, Ki-67 10%, HER-2 negative.   Patient Stated Goals to get as strong as I can as quick as I can   Currently in Pain? Yes   Pain Score 6    Pain Location Arm   Pain Orientation Right   Pain Descriptors / Indicators --  pulling   Pain Type Chronic pain   Pain Frequency Constant  [place] Adult PT Treatment/Exercise - 11/16/15 0001      Shoulder Exercises: Pulleys   Flexion 2 minutes   ABduction 2 minutes     Shoulder Exercises: Therapy Ball   Flexion 10 reps   ABduction 10 reps  on right     Shoulder Exercises: ROM/Strengthening   Other ROM/Strengthening Exercises Finger Ladder for Rt UE abduction 10 times (got up to #22)     Manual Therapy   Soft tissue mobilization To Rt anterior elbow and axillary cording   Passive ROM In Supine to Rt shoulder into gentle flexion, abduction, er, to pts tolerance.                    Short Term Clinic Goals - 11/16/15 1144      CC Short Term Goal  #1    Title Pt to demonstrate 100 degrees of right shoulder flexion to allow her to reach items overhead.    Time 4   Period Weeks   Status Achieved     CC Short Term Goal  #2   Title Pt to demonstrate 52 degrees of right shoulder abduction to allow pt to reach out to sides.   Baseline 52, 90 degrees 10/31/15   Period Weeks   Status Achieved     CC Short Term Goal  #3   Title Pt will be independent in stating lymphedema risk reduction practices to decrease risk of lymphedema   Status Achieved             Long Term Clinic Goals - 11/16/15 1146      CC Long Term Goal  #1   Title Pt will receive appropriate compression garment for management and to decrease risk of RUE edema   Baseline 11/16/15- pt states she has not received this - will resend facesheet   Time 8   Period Weeks   Status On-going     CC Long Term Goal  #2   Title Pt will demonstrate 165 degrees of right shoulder flexion to allow pt to reach items overhead   Baseline 71, 108 degrees 10/31/15, 11/16/15 - 145 degrees   Time 8   Period Weeks     CC Long Term Goal  #3   Title Pt will demonstrate 165 degrees of shoulder abduction to allow pt to reach items out to sides   Baseline 52, 90 degrees 10/31/15, 11/16/15- 125 degrees   Time 8   Period Weeks   Status On-going     CC Long Term Goal  #4   Title Pt will demonstrate at least 50 degrees of right shoulder external rotation to all her to return to prior level of function   Baseline unable to assess due to pain, 67 degrees 10/31/15   Status Achieved     CC Long Term Goal  #5   Title Pt will be independent in a home exercise program for continued strengthening and stretching    Time 8   Period Weeks   Status On-going     CC Long Term Goal  #6   Title Pt will improve Quick Dash impairment percentage to less than or equal to 20 percent for improved functional use of RUE   Baseline 59%   Time 8   Period Weeks   Status On-going            Plan - 11/16/15 1200     Clinical Impression Statement Pt is progressing towards goals in therapy. She does admit  to not being compliant with her home exercise program. Her ROM has improved greatly though since last session. She continues to be limited with ROM and feels increased tightness due to cording from axilla to antecubital fossa.    Rehab Potential Excellent   Clinical Impairments Affecting Rehab Potential to begin chemo 10/28/15   PT Frequency 2x / week   PT Duration 8 weeks   PT Treatment/Interventions Manual lymph drainage;Manual techniques;Therapeutic exercise;Vasopneumatic Device;Taping;Passive range of motion;Patient/family education;DME Instruction;ADLs/Self Care Home Management;Scar mobilization;Compression bandaging;Electrical Stimulation   PT Next Visit Plan Cont AAROM/PROM/AROM of Rt shoulder, add to HEP and issue handout for lymphedema risk reduction.    PT Home Exercise Plan Cont supine dowel shoulder exercises, neural tension stretch on wall   Consulted and Agree with Plan of Care Patient      Patient will benefit from skilled therapeutic intervention in order to improve the following deficits and impairments:  Decreased range of motion, Pain, Impaired UE functional use, Increased fascial restricitons, Decreased strength, Decreased knowledge of use of DME, Decreased knowledge of precautions, Decreased scar mobility, Increased edema  Visit Diagnosis: Pain in right shoulder  Stiffness of right shoulder, not elsewhere classified     Problem List Patient Active Problem List   Diagnosis Date Noted  . Genetic testing 08/16/2015  . Iron deficiency anemia due to chronic blood loss 08/03/2015  . Family history of breast cancer   . Family history of colon cancer   . Family history of prostate cancer   . Breast cancer of lower-outer quadrant of right female breast ([place]) 07/29/2015  . Leiomyoma of uterus, unspecified 08/01/2012  . Irregular menstrual cycle 08/01/2012  . Incompetent cervix  02/19/2011  . Anemia 12/22/2010  . Acid reflux 12/22/2010    Alexia Freestone 11/16/2015, 12:07 PM  [place], [place], [address] Phone: [phone]   Fax:  [phone]  Name: Melanie Moss MRN: [id number] Date of Birth: Jul 01, 1975   Allyson Sabal, PT 11/16/15 12:08 PM

## 2015-11-21 ENCOUNTER — Telehealth: Payer: Self-pay | Admitting: *Deleted

## 2015-11-21 ENCOUNTER — Ambulatory Visit (HOSPITAL_BASED_OUTPATIENT_CLINIC_OR_DEPARTMENT_OTHER): Payer: BLUE CROSS/BLUE SHIELD

## 2015-11-21 ENCOUNTER — Other Ambulatory Visit (HOSPITAL_BASED_OUTPATIENT_CLINIC_OR_DEPARTMENT_OTHER): Payer: BLUE CROSS/BLUE SHIELD

## 2015-11-21 ENCOUNTER — Ambulatory Visit: Payer: BLUE CROSS/BLUE SHIELD | Admitting: Physical Therapy

## 2015-11-21 ENCOUNTER — Telehealth: Payer: Self-pay | Admitting: Nurse Practitioner

## 2015-11-21 ENCOUNTER — Ambulatory Visit (HOSPITAL_BASED_OUTPATIENT_CLINIC_OR_DEPARTMENT_OTHER): Payer: BLUE CROSS/BLUE SHIELD | Admitting: Nurse Practitioner

## 2015-11-21 VITALS — BP 165/99 | HR 84 | Temp 98.6°F | Resp 18 | Ht 63.0 in | Wt 148.6 lb

## 2015-11-21 DIAGNOSIS — D509 Iron deficiency anemia, unspecified: Secondary | ICD-10-CM

## 2015-11-21 DIAGNOSIS — I10 Essential (primary) hypertension: Secondary | ICD-10-CM | POA: Diagnosis not present

## 2015-11-21 DIAGNOSIS — C50511 Malignant neoplasm of lower-outer quadrant of right female breast: Secondary | ICD-10-CM | POA: Diagnosis not present

## 2015-11-21 DIAGNOSIS — M25611 Stiffness of right shoulder, not elsewhere classified: Secondary | ICD-10-CM

## 2015-11-21 DIAGNOSIS — M25511 Pain in right shoulder: Secondary | ICD-10-CM | POA: Diagnosis not present

## 2015-11-21 DIAGNOSIS — D500 Iron deficiency anemia secondary to blood loss (chronic): Secondary | ICD-10-CM

## 2015-11-21 DIAGNOSIS — Z452 Encounter for adjustment and management of vascular access device: Secondary | ICD-10-CM

## 2015-11-21 DIAGNOSIS — Z170 Estrogen receptor positive status [ER+]: Secondary | ICD-10-CM

## 2015-11-21 LAB — CBC WITH DIFFERENTIAL/PLATELET
BASO%: 0.9 % (ref 0.0–2.0)
Basophils Absolute: 0.1 10*3/uL (ref 0.0–0.1)
EOS%: 0 % (ref 0.0–7.0)
Eosinophils Absolute: 0 10*3/uL (ref 0.0–0.5)
HCT: 33 % — ABNORMAL LOW (ref 34.8–46.6)
HGB: 10.8 g/dL — ABNORMAL LOW (ref 11.6–15.9)
LYMPH%: 36.9 % (ref 14.0–49.7)
MCH: 28.7 pg (ref 25.1–34.0)
MCHC: 32.7 g/dL (ref 31.5–36.0)
MCV: 87.8 fL (ref 79.5–101.0)
MONO#: 0.6 10*3/uL (ref 0.1–0.9)
MONO%: 9.9 % (ref 0.0–14.0)
NEUT#: 3.4 10*3/uL (ref 1.5–6.5)
NEUT%: 52.3 % (ref 38.4–76.8)
Platelets: 353 10*3/uL (ref 145–400)
RBC: 3.76 10*6/uL (ref 3.70–5.45)
RDW: 14.7 % — ABNORMAL HIGH (ref 11.2–14.5)
WBC: 6.5 10*3/uL (ref 3.9–10.3)
lymph#: 2.4 10*3/uL (ref 0.9–3.3)

## 2015-11-21 LAB — COMPREHENSIVE METABOLIC PANEL
ALT: 33 U/L (ref 0–55)
AST: 33 U/L (ref 5–34)
Albumin: 3.9 g/dL (ref 3.5–5.0)
Alkaline Phosphatase: 67 U/L (ref 40–150)
Anion Gap: 8 mEq/L (ref 3–11)
BUN: 8.3 mg/dL (ref 7.0–26.0)
CO2: 27 mEq/L (ref 22–29)
Calcium: 9.6 mg/dL (ref 8.4–10.4)
Chloride: 104 mEq/L (ref 98–109)
Creatinine: 0.7 mg/dL (ref 0.6–1.1)
EGFR: >90 mL/min/{1.73_m2} (ref >90)
Glucose: 120 mg/dl (ref 70–140)
Potassium: 3.5 mEq/L (ref 3.5–5.1)
Sodium: 139 mEq/L (ref 136–145)
Total Bilirubin: <0.3 mg/dL (ref 0.20–1.20)
Total Protein: 7.2 g/dL (ref 6.4–8.3)

## 2015-11-21 MED ORDER — HEPARIN SOD (PORK) LOCK FLUSH 100 UNIT/ML IV SOLN
500.0000 [IU] | Freq: Once | INTRAVENOUS | Status: AC | PRN
  Administered 2015-11-21: 500 [IU] via INTRAVENOUS
  Filled 2015-11-21: qty 5

## 2015-11-21 MED ORDER — SODIUM CHLORIDE 0.9 % IJ SOLN
10.0000 mL | INTRAMUSCULAR | Status: DC | PRN
Start: 2015-11-21 — End: 2015-11-21
  Administered 2015-11-21: 10 mL via INTRAVENOUS
  Filled 2015-11-21: qty 10

## 2015-11-21 NOTE — Telephone Encounter (Signed)
Per staff message and POF I have scheduled appts. Advised scheduler of appts. JMW  

## 2015-11-21 NOTE — Telephone Encounter (Signed)
per pof to sch pt appt-gave pt copy of avs °

## 2015-11-21 NOTE — Therapy (Signed)
[place], [place], [address] Phone: [phone]   Fax:  [phone]  Physical Therapy Treatment  Patient Details  Name: Melanie Moss MRN: [id number] Date of Birth: 12-12-1975 Referring Provider: Burr Medico  Encounter Date: 11/21/2015      PT End of Session - 11/21/15 2136    Visit Number 6   Number of Visits 17   Date for PT Re-Evaluation 12/15/15   PT Start Time 1103   PT Stop Time 1147   PT Time Calculation (min) 44 min   Activity Tolerance Patient tolerated treatment well   Behavior During Therapy [facility] for tasks assessed/performed      Past Medical History:  Diagnosis Date  . Anemia   . Anxiety   . Breast cancer of lower-outer quadrant of right female breast ([place]) 07/29/2015  . Family history of breast cancer   . Family history of colon cancer   . Family history of prostate cancer   . GERD (gastroesophageal reflux disease)    tums   . Gestational diabetes   . Hyperlipidemia   . Hypertension    not currently on medication  . Preterm labor     Past Surgical History:  Procedure Laterality Date  . AXILLARY LYMPH NODE DISSECTION Right 09/29/2015   Procedure: AXILLARY LYMPH NODE DISSECTION;  Surgeon: Excell Seltzer, MD;  Location: [place];  Service: General;  Laterality: Right;  . BREAST RECONSTRUCTION WITH PLACEMENT OF TISSUE EXPANDER AND FLEX HD (ACELLULAR HYDRATED DERMIS) Right 09/29/2015   Procedure: BREAST RECONSTRUCTION WITH PLACEMENT OF TISSUE EXPANDER AND FLEX HD (ACELLULAR HYDRATED DERMIS);  Surgeon: Loel Lofty Dillingham, DO;  Location: [place];  Service: Plastics;  Laterality: Right;  . CERVICAL CERCLAGE  02/19/2011   Procedure: CERCLAGE CERVICAL;  Surgeon: Agnes Lawrence, MD;  Location: [facility];  Service: Gynecology;  Laterality: N/A;  . DILATION AND CURETTAGE OF UTERUS    . MASTECTOMY WITH AXILLARY LYMPH NODE DISSECTION Right 09/29/2015  . PORTACATH PLACEMENT N/A 09/29/2015   Procedure: INSERTION PORT-A-CATH;  Surgeon: Excell Seltzer, MD;  Location: [place];  Service: General;  Laterality: N/A;  . SIMPLE MASTECTOMY WITH AXILLARY SENTINEL NODE BIOPSY Right 09/29/2015   Procedure: RIGHT TOTAL  MASTECTOMY WITH AXILLARY SENTINEL NODE BIOPSY;  Surgeon: Excell Seltzer, MD;  Location: [place];  Service: General;  Laterality: Right;    There were no vitals filed for this visit.      Subjective Assessment - 11/21/15 1107    Subjective I have my chemo tomorrow so I don't know if I'm going to make it to my appointment on Wednesday or not.  She will let us know.  Did well with HEP this weekend, both wall walking and rolling ball up wall for flexion and abduction.   Currently in Pain? Yes   Pain Score 3    Pain Location Arm   Pain Orientation Right;Anterior;Mid  antecubital fossa   Pain Descriptors / Indicators Sore  sore to touch   Aggravating Factors  at night it seems to stiffen up   Pain Relieving Factors rubbing it                         [facility] Adult PT Treatment/Exercise - 11/21/15 0001      Shoulder Exercises: Supine   Other Supine Exercises hooklying lower trunk rotation with arms outstretched, knees going left to get right chest stretch     Shoulder Exercises: Pulleys   Flexion 2  minutes   ABduction 2 minutes     Shoulder Exercises: Therapy Ball   Flexion 10 reps   ABduction 10 reps  on right     Shoulder Exercises: ROM/Strengthening   Other ROM/Strengthening Exercises Finger Ladder for Rt UE abduction 5 times (got up to #24)     Manual Therapy   Manual Therapy Myofascial release   Soft tissue mobilization To Rt anterior elbow cording    Myofascial Release across right axilla, right UE myofascial pulling, both in supine   Passive ROM In Supine to Rt shoulder into gentle flexion, abduction, er, to pts tolerance.   Also into horizontal abduction.   Neural Stretch right UE                PT Education - 11/21/15 2136     Education provided Yes   Education Details hooklying lower trunk rotation with arms outstretched for right chest/anterior shoulder stretch   Person(s) Educated Patient   Methods Explanation;Tactile cues;Verbal cues;Handout   Comprehension Verbalized understanding;Returned demonstration           Short Term Clinic Goals - 11/16/15 1144      CC Short Term Goal  #1   Title Pt to demonstrate 100 degrees of right shoulder flexion to allow her to reach items overhead.    Time 4   Period Weeks   Status Achieved     CC Short Term Goal  #2   Title Pt to demonstrate 52 degrees of right shoulder abduction to allow pt to reach out to sides.   Baseline 52, 90 degrees 10/31/15   Period Weeks   Status Achieved     CC Short Term Goal  #3   Title Pt will be independent in stating lymphedema risk reduction practices to decrease risk of lymphedema   Status Achieved             Long Term Clinic Goals - 11/16/15 1146      CC Long Term Goal  #1   Title Pt will receive appropriate compression garment for management and to decrease risk of RUE edema   Baseline 11/16/15- pt states she has not received this - will resend facesheet   Time 8   Period Weeks   Status On-going     CC Long Term Goal  #2   Title Pt will demonstrate 165 degrees of right shoulder flexion to allow pt to reach items overhead   Baseline 71, 108 degrees 10/31/15, 11/16/15 - 145 degrees   Time 8   Period Weeks     CC Long Term Goal  #3   Title Pt will demonstrate 165 degrees of shoulder abduction to allow pt to reach items out to sides   Baseline 52, 90 degrees 10/31/15, 11/16/15- 125 degrees   Time 8   Period Weeks   Status On-going     CC Long Term Goal  #4   Title Pt will demonstrate at least 50 degrees of right shoulder external rotation to all her to return to prior level of function   Baseline unable to assess due to pain, 67 degrees 10/31/15   Status Achieved     CC Long Term Goal  #5   Title Pt will be  independent in a home exercise program for continued strengthening and stretching    Time 8   Period Weeks   Status On-going     CC Long Term Goal  #6   Title Pt will improve Quick Dash impairment  percentage to less than or equal to 20 percent for improved functional use of RUE   Baseline 59%   Time 8   Period Weeks   Status On-going            Plan - 11/21/15 2137    Clinical Impression Statement Patient did well with stretching today, and reported feeling better at end of session.  She took cueing well for exercise.   Rehab Potential Excellent   Clinical Impairments Affecting Rehab Potential to begin chemo 10/28/15; will have second chemo 11/22/15   PT Frequency 2x / week   PT Duration 8 weeks   PT Treatment/Interventions Manual lymph drainage;Manual techniques;Therapeutic exercise;Vasopneumatic Device;Taping;Passive range of motion;Patient/family education;DME Instruction;ADLs/Self Care Home Management;Scar mobilization;Compression bandaging;Electrical Stimulation   PT Next Visit Plan Cont AAROM/PROM/AROM of Rt shoulder, add to HEP and issue handout for lymphedema risk reduction.    PT Home Exercise Plan Cont supine dowel shoulder exercises, neural tension stretch on wall, hooklying lower trunk rotation   Consulted and Agree with Plan of Care Patient      Patient will benefit from skilled therapeutic intervention in order to improve the following deficits and impairments:  Decreased range of motion, Pain, Impaired UE functional use, Increased fascial restricitons, Decreased strength, Decreased knowledge of use of DME, Decreased knowledge of precautions, Decreased scar mobility, Increased edema  Visit Diagnosis: Pain in right shoulder  Stiffness of right shoulder, not elsewhere classified     Problem List Patient Active Problem List   Diagnosis Date Noted  . Genetic testing 08/16/2015  . Iron deficiency anemia due to chronic blood loss 08/03/2015  . Family history of  breast cancer   . Family history of colon cancer   . Family history of prostate cancer   . Breast cancer of lower-outer quadrant of right female breast ([place]) 07/29/2015  . Leiomyoma of uterus, unspecified 08/01/2012  . Irregular menstrual cycle 08/01/2012  . 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  . Anemia 12/22/2010  . Acid reflux 12/22/2010    Melanie Moss 11/21/2015, 9:41 PM  [place], [place], [address] Phone: [phone]   Fax:  [phone]  Name: Melanie Moss MRN: [id number] Date of Birth: 07/29/1975   Serafina Royals, PT 11/21/15 9:41 PM

## 2015-11-21 NOTE — Progress Notes (Signed)
  [facility] OFFICE PROGRESS NOTE   Diagnosis:  Right breast cancer  INTERVAL HISTORY:   [name] returns as scheduled. She completed cycle 1 TCHP 11/01/2015. She had a few episodes of nausea and one episode of vomiting. No mouth sores. She had loose stools for one day. About a week after the chemotherapy she developed achy leg and low back pain. This lasted for about 24 hours. The skin rash she had at the time of her last visit has resolved.  Objective:  Vital signs in last 24 hours:  Blood pressure (!) 165/99, pulse 84, temperature 98.6 F (37 C), temperature source Oral, resp. rate 18, height '5\' 3"'$  (1.6 m), weight 148 lb 9.6 oz (67.4 kg), last menstrual period 11/04/2015, SpO2 100 %.    HEENT: No thrush or ulcers. Resp: Lungs clear bilaterally. Cardio: Regular rate and rhythm. GI: Abdomen soft and nontender. No hepatomegaly. Vascular: No leg edema. Calves soft and nontender. Breast: Status post right mastectomy. Right mastectomy scar has healed. Port-A-Cath without erythema.   Lab Results:  Lab Results  Component Value Date   WBC 6.5 11/21/2015   HGB 10.8 (L) 11/21/2015   HCT 33.0 (L) 11/21/2015   MCV 87.8 11/21/2015   PLT 353 11/21/2015   NEUTROABS 3.4 11/21/2015    Imaging:  No results found.  Medications: I have reviewed the patient's current medications.  Assessment/Plan: 1. 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 Cycle 1 adjuvant TCHP 11/01/2015. 2. Iron deficiency anemia. 3. Genetics. She has seen the genetics counselor. Genetic testing normal. 4. Breast pain from tissue expander. 5. Hypertension. She plans to contact her PCP.   Disposition: Melanie Moss appears stable. She has completed 1 cycle of TCHP. Plan to proceed with cycle 2 as scheduled 11/22/2015. Per Dr. Ernestina Penna  office note 11/09/2015 she will not receive Neulasta with cycle 2 due to significant bone pain and fatigue following cycle 1. She will return for a follow-up visit in 3 weeks. She will contact the office in the interim with any problems.    Ned Card ANP/GNP-BC   11/21/2015  2:51 PM

## 2015-11-21 NOTE — Patient Instructions (Addendum)
Supine With Rotation    Lie, back flat, legs bent, feet together. Have your arms straight out to the sides, away from your body (different from picture here).  Rotate knees to left side. Hold _30-60__ seconds. Repeat _1-2__ times per session. Do _2__ sessions per day.  Copyright  VHI. All rights reserved.

## 2015-11-22 ENCOUNTER — Ambulatory Visit (HOSPITAL_BASED_OUTPATIENT_CLINIC_OR_DEPARTMENT_OTHER): Payer: BLUE CROSS/BLUE SHIELD

## 2015-11-22 ENCOUNTER — Encounter: Payer: Self-pay | Admitting: *Deleted

## 2015-11-22 VITALS — BP 136/87 | HR 91 | Temp 98.5°F | Resp 18

## 2015-11-22 DIAGNOSIS — Z5112 Encounter for antineoplastic immunotherapy: Secondary | ICD-10-CM | POA: Diagnosis not present

## 2015-11-22 DIAGNOSIS — C50511 Malignant neoplasm of lower-outer quadrant of right female breast: Secondary | ICD-10-CM

## 2015-11-22 DIAGNOSIS — Z5111 Encounter for antineoplastic chemotherapy: Secondary | ICD-10-CM

## 2015-11-22 MED ORDER — SODIUM CHLORIDE 0.9 % IV SOLN
75.0000 mg/m2 | Freq: Once | INTRAVENOUS | Status: AC
  Administered 2015-11-22: 130 mg via INTRAVENOUS
  Filled 2015-11-22: qty 13

## 2015-11-22 MED ORDER — SODIUM CHLORIDE 0.9 % IV SOLN
Freq: Once | INTRAVENOUS | Status: AC
  Administered 2015-11-22: 11:00:00 via INTRAVENOUS

## 2015-11-22 MED ORDER — DIPHENHYDRAMINE HCL 25 MG PO CAPS
ORAL_CAPSULE | ORAL | Status: AC
  Filled 2015-11-22: qty 2

## 2015-11-22 MED ORDER — DIPHENHYDRAMINE HCL 25 MG PO CAPS
50.0000 mg | ORAL_CAPSULE | Freq: Once | ORAL | Status: AC
  Administered 2015-11-22: 50 mg via ORAL

## 2015-11-22 MED ORDER — SODIUM CHLORIDE 0.9 % IV SOLN
720.0000 mg | Freq: Once | INTRAVENOUS | Status: AC
  Administered 2015-11-22: 720 mg via INTRAVENOUS
  Filled 2015-11-22: qty 72

## 2015-11-22 MED ORDER — TRASTUZUMAB CHEMO INJECTION 440 MG
6.0000 mg/kg | Freq: Once | INTRAVENOUS | Status: AC
  Administered 2015-11-22: 399 mg via INTRAVENOUS
  Filled 2015-11-22: qty 19

## 2015-11-22 MED ORDER — SODIUM CHLORIDE 0.9 % IV SOLN
420.0000 mg | Freq: Once | INTRAVENOUS | Status: AC
  Administered 2015-11-22: 420 mg via INTRAVENOUS
  Filled 2015-11-22: qty 14

## 2015-11-22 MED ORDER — SODIUM CHLORIDE 0.9% FLUSH
10.0000 mL | INTRAVENOUS | Status: DC | PRN
  Administered 2015-11-22: 10 mL
  Filled 2015-11-22: qty 10

## 2015-11-22 MED ORDER — PALONOSETRON HCL INJECTION 0.25 MG/5ML
0.2500 mg | Freq: Once | INTRAVENOUS | Status: AC
Start: 2015-11-22 — End: 2015-11-22
  Administered 2015-11-22: 0.25 mg via INTRAVENOUS

## 2015-11-22 MED ORDER — PALONOSETRON HCL INJECTION 0.25 MG/5ML
INTRAVENOUS | Status: AC
  Filled 2015-11-22: qty 5

## 2015-11-22 MED ORDER — ACETAMINOPHEN 325 MG PO TABS
650.0000 mg | ORAL_TABLET | Freq: Once | ORAL | Status: AC
  Administered 2015-11-22: 650 mg via ORAL

## 2015-11-22 MED ORDER — HEPARIN SOD (PORK) LOCK FLUSH 100 UNIT/ML IV SOLN
500.0000 [IU] | Freq: Once | INTRAVENOUS | Status: AC | PRN
  Administered 2015-11-22: 500 [IU]
  Filled 2015-11-22: qty 5

## 2015-11-22 MED ORDER — DEXAMETHASONE SODIUM PHOSPHATE 100 MG/10ML IJ SOLN
10.0000 mg | Freq: Once | INTRAMUSCULAR | Status: AC
  Administered 2015-11-22: 10 mg via INTRAVENOUS
  Filled 2015-11-22: qty 1

## 2015-11-22 MED ORDER — ACETAMINOPHEN 325 MG PO TABS
ORAL_TABLET | ORAL | Status: AC
  Filled 2015-11-22: qty 2

## 2015-11-22 NOTE — Patient Instructions (Signed)
[facility] Discharge Instructions for Patients Receiving Chemotherapy  Today you received the following chemotherapy agents:  Taxotere, Carboplatin, Herceptin, Perjeta  To help prevent nausea and vomiting after your treatment, we encourage you to take your nausea medications as prescribed.   If you develop nausea and vomiting that is not controlled by your nausea medication, call the clinic.   BELOW ARE SYMPTOMS THAT SHOULD BE REPORTED IMMEDIATELY:  *FEVER GREATER THAN 100.5 F  *CHILLS WITH OR WITHOUT FEVER  NAUSEA AND VOMITING THAT IS NOT CONTROLLED WITH YOUR NAUSEA MEDICATION  *UNUSUAL SHORTNESS OF BREATH  *UNUSUAL BRUISING OR BLEEDING  TENDERNESS IN MOUTH AND THROAT WITH OR WITHOUT PRESENCE OF ULCERS  *URINARY PROBLEMS  *BOWEL PROBLEMS  UNUSUAL RASH Items with * indicate a potential emergency and should be followed up as soon as possible.  Feel free to call the clinic you have any questions or concerns. The clinic phone number is [phone].  Please show the [facility] at check-in to the Emergency Department and triage nurse.

## 2015-11-22 NOTE — Progress Notes (Signed)
Called Dr Larey Dresser office to get pt scheduled for f/u MD visit & ECHO.  Informed that last ECHO was done in April but chemo didn't start until 11/01/15 & second cycle today.  Discussed with Dr Benay Spice & OK to treat today based on April ECHO.   Informed pt that she will not have to come back for neulasta tomorrow per Dr. Ernestina Penna note & per Ned Card NP.

## 2015-11-23 ENCOUNTER — Telehealth (HOSPITAL_COMMUNITY): Payer: Self-pay | Admitting: Vascular Surgery

## 2015-11-23 ENCOUNTER — Ambulatory Visit: Payer: BLUE CROSS/BLUE SHIELD | Attending: Hematology | Admitting: Physical Therapy

## 2015-11-23 DIAGNOSIS — M25611 Stiffness of right shoulder, not elsewhere classified: Secondary | ICD-10-CM | POA: Diagnosis present

## 2015-11-23 DIAGNOSIS — I972 Postmastectomy lymphedema syndrome: Secondary | ICD-10-CM | POA: Insufficient documentation

## 2015-11-23 DIAGNOSIS — M25511 Pain in right shoulder: Secondary | ICD-10-CM

## 2015-11-23 NOTE — Therapy (Signed)
[place], [place], [address] Phone: [phone]   Fax:  [phone]  Physical Therapy Treatment  Patient Details  Name: Melanie Moss MRN: [id number] Date of Birth: 1975-07-05 Referring Provider: Burr Medico  Encounter Date: 11/23/2015      PT End of Session - 11/23/15 1151    Visit Number 7   Number of Visits 17   Date for PT Re-Evaluation 12/15/15   PT Start Time 1103   PT Stop Time 1145   PT Time Calculation (min) 42 min   Activity Tolerance Patient tolerated treatment well   Behavior During Therapy [facility] for tasks assessed/performed      Past Medical History:  Diagnosis Date  . Anemia   . Anxiety   . Breast cancer of lower-outer quadrant of right female breast ([place]) 07/29/2015  . Family history of breast cancer   . Family history of colon cancer   . Family history of prostate cancer   . GERD (gastroesophageal reflux disease)    tums   . Gestational diabetes   . Hyperlipidemia   . Hypertension    not currently on medication  . Preterm labor     Past Surgical History:  Procedure Laterality Date  . AXILLARY LYMPH NODE DISSECTION Right 09/29/2015   Procedure: AXILLARY LYMPH NODE DISSECTION;  Surgeon: Excell Seltzer, MD;  Location: [place];  Service: General;  Laterality: Right;  . BREAST RECONSTRUCTION WITH PLACEMENT OF TISSUE EXPANDER AND FLEX HD (ACELLULAR HYDRATED DERMIS) Right 09/29/2015   Procedure: BREAST RECONSTRUCTION WITH PLACEMENT OF TISSUE EXPANDER AND FLEX HD (ACELLULAR HYDRATED DERMIS);  Surgeon: Loel Lofty Dillingham, DO;  Location: [place];  Service: Plastics;  Laterality: Right;  . CERVICAL CERCLAGE  02/19/2011   Procedure: CERCLAGE CERVICAL;  Surgeon: Agnes Lawrence, MD;  Location: [facility];  Service: Gynecology;  Laterality: N/A;  . DILATION AND CURETTAGE OF UTERUS    . MASTECTOMY WITH AXILLARY LYMPH NODE DISSECTION Right 09/29/2015  . PORTACATH PLACEMENT N/A 09/29/2015   Procedure:  INSERTION PORT-A-CATH;  Surgeon: Excell Seltzer, MD;  Location: [place];  Service: General;  Laterality: N/A;  . SIMPLE MASTECTOMY WITH AXILLARY SENTINEL NODE BIOPSY Right 09/29/2015   Procedure: RIGHT TOTAL  MASTECTOMY WITH AXILLARY SENTINEL NODE BIOPSY;  Surgeon: Excell Seltzer, MD;  Location: [place];  Service: General;  Laterality: Right;    There were no vitals filed for this visit.                Katina Dung - 11/23/15 0001    Open a tight or new jar Mild difficulty   Do heavy household chores (wash walls, wash floors) No difficulty   Carry a shopping bag or briefcase No difficulty   Wash your back Mild difficulty   Use a knife to cut food No difficulty   Recreational activities in which you take some force or impact through your arm, shoulder, or hand (golf, hammering, tennis) No difficulty   During the past week, to what extent has your arm, shoulder or hand problem interfered with your normal social activities with family, friends, neighbors, or groups? Not at all   During the past week, to what extent has your arm, shoulder or hand problem limited your work or other regular daily activities Not at all   Arm, shoulder, or hand pain. Mild   Tingling (pins and needles) in your arm, shoulder, or hand None   Difficulty Sleeping No difficulty   DASH Score 6.82 %  [facility] Adult PT Treatment/Exercise - 11/23/15 0001      Shoulder Exercises: Supine   Horizontal ABduction Strengthening;Both;10 reps;Theraband   Theraband Level (Shoulder Horizontal ABduction) Level 1 (Yellow)   External Rotation Strengthening;Both;10 reps;Theraband   Theraband Level (Shoulder External Rotation) Level 1 (Yellow)   Flexion Strengthening;Both;10 reps;Theraband  narrow and wide grip   Theraband Level (Shoulder Flexion) Level 1 (Yellow)   Other Supine Exercises D2 x 10 reps bilat with yellow band     Shoulder Exercises: Pulleys   Flexion 2 minutes   ABduction 2 minutes      Shoulder Exercises: Therapy Ball   Flexion 10 reps   ABduction 10 reps  on right     Shoulder Exercises: ROM/Strengthening   Other ROM/Strengthening Exercises Finger Ladder for Rt UE abduction 5 times (got up to #25)     Manual Therapy   Manual Therapy Myofascial release   Soft tissue mobilization To Rt anterior elbow cording    Passive ROM In Supine to Rt shoulder into gentle flexion, abduction, er, to pts tolerance                PT Education - 11/23/15 1154    Education provided Yes   Education Details lymphedema risk reduction practices   Person(s) Educated Patient   Methods Explanation;Handout   Comprehension Verbalized understanding           Short Term Clinic Goals - 11/16/15 1144      CC Short Term Goal  #1   Title Pt to demonstrate 100 degrees of right shoulder flexion to allow her to reach items overhead.    Time 4   Period Weeks   Status Achieved     CC Short Term Goal  #2   Title Pt to demonstrate 52 degrees of right shoulder abduction to allow pt to reach out to sides.   Baseline 52, 90 degrees 10/31/15   Period Weeks   Status Achieved     CC Short Term Goal  #3   Title Pt will be independent in stating lymphedema risk reduction practices to decrease risk of lymphedema   Status Achieved             Long Term Clinic Goals - 11/23/15 1120      CC Long Term Goal  #1   Title Pt will receive appropriate compression garment for management and to decrease risk of RUE edema   Baseline 11/16/15- pt states she has not received this - will resend facesheet, 11/23/15- pt awaiting call from fitter   Status On-going     CC Long Term Goal  #2   Title Pt will demonstrate 165 degrees of right shoulder flexion to allow pt to reach items overhead   Baseline 71, 108 degrees 10/31/15, 11/16/15 - 145 degrees, 11/23/15- 165 degrees    Time 8   Period Weeks   Status Achieved     CC Long Term Goal  #3   Title Pt will demonstrate 165 degrees of shoulder abduction  to allow pt to reach items out to sides   Baseline 52, 90 degrees 10/31/15, 11/16/15- 125 degrees, 11/23/15- 143 degrees   Status On-going     CC Long Term Goal  #4   Title Pt will demonstrate at least 50 degrees of right shoulder external rotation to all her to return to prior level of function   Baseline unable to assess due to pain, 67 degrees 10/31/15   Status Achieved     CC  Long Term Goal  #5   Title Pt will be independent in a home exercise program for continued strengthening and stretching    Status On-going     CC Long Term Goal  #6   Title Pt will improve Quick Dash impairment percentage to less than or equal to 20 percent for improved functional use of RUE   Baseline 59%, 11/23/15- 6.82% impairment   Status Achieved            Plan - 11/23/15 1152    Clinical Impression Statement Pt made excellent progress today in therapy. She has met her shoulder flexion ROM goal. She is to be measured for a sleeve this week. She was able to gain full active shoulder abduction gravity eliminated following PROM and then when she sat up at end of session she was able to acheive full active shoulder abduction. Instructed pt in shoulder stabilization exercises today.    Rehab Potential Excellent   Clinical Impairments Affecting Rehab Potential to begin chemo 10/28/15; will have second chemo 11/22/15   PT Frequency 2x / week   PT Duration 8 weeks   PT Treatment/Interventions Manual lymph drainage;Manual techniques;Therapeutic exercise;Vasopneumatic Device;Taping;Passive range of motion;Patient/family education;DME Instruction;ADLs/Self Care Home Management;Scar mobilization;Compression bandaging;Electrical Stimulation   PT Next Visit Plan Cont AAROM/PROM/AROM of Rt shoulder, add to HEP, assess for indep with supine scap stabilization exercises   PT Home Exercise Plan Cont supine dowel shoulder exercises, neural tension stretch on wall, hooklying lower trunk rotation, supine scapular stabilization  exercises   Consulted and Agree with Plan of Care Patient      Patient will benefit from skilled therapeutic intervention in order to improve the following deficits and impairments:  Decreased range of motion, Pain, Impaired UE functional use, Increased fascial restricitons, Decreased strength, Decreased knowledge of use of DME, Decreased knowledge of precautions, Decreased scar mobility, Increased edema  Visit Diagnosis: Pain in right shoulder  Stiffness of right shoulder, not elsewhere classified     Problem List Patient Active Problem List   Diagnosis Date Noted  . Genetic testing 08/16/2015  . Iron deficiency anemia due to chronic blood loss 08/03/2015  . Family history of breast cancer   . Family history of colon cancer   . Family history of prostate cancer   . Breast cancer of lower-outer quadrant of right female breast ([place]) 07/29/2015  . Leiomyoma of uterus, unspecified 08/01/2012  . Irregular menstrual cycle 08/01/2012  . 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  . Anemia 12/22/2010  . Acid reflux 12/22/2010    Alexia Freestone 11/23/2015, 11:55 AM  [place], [place], [address] Phone: [phone]   Fax:  [phone]  Name: Melanie Moss MRN: [id number] Date of Birth: 02-Dec-1975   Allyson Sabal, PT 11/23/15 11:55 AM

## 2015-11-23 NOTE — Telephone Encounter (Signed)
Left pt message to make f/u appt w/ ECHO 

## 2015-11-23 NOTE — Patient Instructions (Signed)
Over Head Pull: Narrow Grip     [place] [phone]   On back, knees bent, feet flat, band across thighs, elbows straight but relaxed. Pull hands apart (start). Keeping elbows straight, bring arms up and over head, hands toward floor. Keep pull steady on band. Hold momentarily. Return slowly, keeping pull steady, back to start. Repeat __10_ times. Band color ___yellow ___   Side Pull: Double Arm   On back, knees bent, feet flat. Arms perpendicular to body, shoulder level, elbows straight but relaxed. Pull arms out to sides, elbows straight. Resistance band comes across collarbones, hands toward floor. Hold momentarily. Slowly return to starting position. Repeat _10__ times. Band color __yellow___   Sash   On back, knees bent, feet flat, left hand on left hip, right hand above left. Pull right arm DIAGONALLY (hip to shoulder) across chest. Bring right arm along head toward floor. Hold momentarily. Slowly return to starting position. Repeat __10_ times. Do with left arm. Band color ___yellow__   Shoulder Rotation: Double Arm   On back, knees bent, feet flat, elbows tucked at sides, bent 90, hands palms up. Pull hands apart and down toward floor, keeping elbows near sides. Hold momentarily. Slowly return to starting position. Repeat _10__ times. Band color __yellow____    

## 2015-11-24 ENCOUNTER — Telehealth (HOSPITAL_COMMUNITY): Payer: Self-pay | Admitting: Vascular Surgery

## 2015-11-24 ENCOUNTER — Telehealth: Payer: Self-pay | Admitting: *Deleted

## 2015-11-24 NOTE — Telephone Encounter (Signed)
Received call from pt stating that she received call from plastic surgeon wanting to know when she wants to schedule her surgery since it was approved.  She states since she is getting chemo, she didn't know what to tell them.  She had a last injection to expander 2 wks ago.  Informed that we would discuss with Dr Burr Medico upon her return on 11/28/15. She will wait to hear back from [place].

## 2015-11-24 NOTE — Telephone Encounter (Signed)
LEFT PT MESSAGE TO MAKE F/U W/ echo

## 2015-11-28 ENCOUNTER — Ambulatory Visit: Payer: BLUE CROSS/BLUE SHIELD

## 2015-11-30 ENCOUNTER — Ambulatory Visit: Payer: BLUE CROSS/BLUE SHIELD

## 2015-11-30 ENCOUNTER — Ambulatory Visit (HOSPITAL_COMMUNITY)
Admission: RE | Admit: 2015-11-30 | Discharge: 2015-11-30 | Disposition: A | Discharge status: Home / Self Care | Payer: BLUE CROSS/BLUE SHIELD | Source: Ambulatory Visit | Attending: Internal Medicine | Admitting: Internal Medicine

## 2015-11-30 DIAGNOSIS — M25611 Stiffness of right shoulder, not elsewhere classified: Secondary | ICD-10-CM

## 2015-11-30 DIAGNOSIS — I972 Postmastectomy lymphedema syndrome: Secondary | ICD-10-CM

## 2015-11-30 DIAGNOSIS — Z09 Encounter for follow-up examination after completed treatment for conditions other than malignant neoplasm: Secondary | ICD-10-CM | POA: Diagnosis present

## 2015-11-30 DIAGNOSIS — M25511 Pain in right shoulder: Secondary | ICD-10-CM

## 2015-11-30 DIAGNOSIS — C50511 Malignant neoplasm of lower-outer quadrant of right female breast: Secondary | ICD-10-CM

## 2015-11-30 NOTE — Progress Notes (Signed)
  Echocardiogram 2D Echocardiogram has been performed.  Aggie Cosier 11/30/2015, 3:29 PM

## 2015-11-30 NOTE — Patient Instructions (Signed)
Standing 3 way raises:  Can use 2 lbs: 1. Lift arms in front just to shoulder height 2. Lift arms a little wider in a "V" just to shoulder height 3. Lift arms out to sides just to shoulder height  1-2 sets of 10 reps, about 3x/week   Cancer Rehab [phone]

## 2015-11-30 NOTE — Therapy (Signed)
[place], [place], [address] Phone: [phone]   Fax:  [phone]  Physical Therapy Treatment  Patient Details  Name: Melanie Moss MRN: [id number] Date of Birth: [birth year] Referring Provider: Burr Medico  Encounter Date: 11/30/2015      PT End of Session - 11/30/15 1200    Visit Number 8   Number of Visits 17   Date for PT Re-Evaluation 12/15/15   PT Start Time 1113   PT Stop Time 1154   PT Time Calculation (min) 41 min   Activity Tolerance Patient tolerated treatment well   Behavior During Therapy [facility] for tasks assessed/performed      Past Medical History:  Diagnosis Date  . Anemia   . Anxiety   . Breast cancer of lower-outer quadrant of right female breast ([place]) 07/29/2015  . Family history of breast cancer   . Family history of colon cancer   . Family history of prostate cancer   . GERD (gastroesophageal reflux disease)    tums   . Gestational diabetes   . Hyperlipidemia   . Hypertension    not currently on medication  . Preterm labor     Past Surgical History:  Procedure Laterality Date  . AXILLARY LYMPH NODE DISSECTION Right 09/29/2015   Procedure: AXILLARY LYMPH NODE DISSECTION;  Surgeon: Excell Seltzer, MD;  Location: [place];  Service: General;  Laterality: Right;  . BREAST RECONSTRUCTION WITH PLACEMENT OF TISSUE EXPANDER AND FLEX HD (ACELLULAR HYDRATED DERMIS) Right 09/29/2015   Procedure: BREAST RECONSTRUCTION WITH PLACEMENT OF TISSUE EXPANDER AND FLEX HD (ACELLULAR HYDRATED DERMIS);  Surgeon: Loel Lofty Dillingham, DO;  Location: [place];  Service: Plastics;  Laterality: Right;  . CERVICAL CERCLAGE  02/19/2011   Procedure: CERCLAGE CERVICAL;  Surgeon: Agnes Lawrence, MD;  Location: [facility];  Service: Gynecology;  Laterality: N/A;  . DILATION AND CURETTAGE OF UTERUS    . MASTECTOMY WITH AXILLARY LYMPH NODE DISSECTION Right 09/29/2015  . PORTACATH PLACEMENT N/A 09/29/2015   Procedure:  INSERTION PORT-A-CATH;  Surgeon: Excell Seltzer, MD;  Location: [place];  Service: General;  Laterality: N/A;  . SIMPLE MASTECTOMY WITH AXILLARY SENTINEL NODE BIOPSY Right 09/29/2015   Procedure: RIGHT TOTAL  MASTECTOMY WITH AXILLARY SENTINEL NODE BIOPSY;  Surgeon: Excell Seltzer, MD;  Location: [place];  Service: General;  Laterality: Right;    There were no vitals filed for this visit.      Subjective Assessment - 11/30/15 1115    Subjective Been doing the exercises she gave me last time and they are going well. No questions about those. Also been doing okay with my chemo. Got measured for my compression sleeve last Thursday. The cording in my Rt elbow is completely gone too, I'm really happy about that.   Pertinent History June 8th right side mastectomy and lymph node dissection, pt starts taking chemo next Friday, pt has to see plastic surgeon for implant tomorrow, pt currently has an expander on R, pt to begin radiation after chemotherapy, (1) ER 95% positive, PR 95% positive, Ki-67 30%, HER-2 positive with ratio 2.04, copy # 5.20, (2) ER 90% positive, PR 95% positive, Ki-67 10%, HER-2 negative.   Patient Stated Goals to get as strong as I can as quick as I can   Currently in Pain? No/denies            [facility] PT Assessment - 11/30/15 0001      AROM   Right Shoulder Flexion 153 Degrees  Right Shoulder ABduction 162 Degrees                     [facility] Adult PT Treatment/Exercise - 11/30/15 0001      Shoulder Exercises: Standing   Flexion Strengthening;Both;20 reps;Weights  To 90 degrees, 2 sets of 10   Shoulder Flexion Weight (lbs) 2   ABduction Strengthening;Both;20 reps;Weights  To 90 degrees, 2 sets of 10 reps   Shoulder ABduction Weight (lbs) 2   Other Standing Exercises Standing bil UE scaption with 2 lbs to 90 degrees 2 x10     Shoulder Exercises: Pulleys   Flexion 2 minutes   ABduction 2 minutes     Shoulder Exercises: Therapy Ball   Flexion 10 reps   With forward lean into top of stretch   ABduction 10 reps  Rt UE with side lean into top of stretch     Manual Therapy   Manual Therapy Passive ROM   Passive ROM In Supine to Rt shoulder into gentle flexion, abduction, er, to pts tolerance                PT Education - 11/30/15 1151    Education provided Yes   Education Details Standing 3 way raises    Person(s) Educated Patient   Methods Explanation;Demonstration;Handout   Comprehension Verbalized understanding;Returned demonstration           Short Term Clinic Goals - 11/16/15 1144      CC Short Term Goal  #1   Title Pt to demonstrate 100 degrees of right shoulder flexion to allow her to reach items overhead.    Time 4   Period Weeks   Status Achieved     CC Short Term Goal  #2   Title Pt to demonstrate 52 degrees of right shoulder abduction to allow pt to reach out to sides.   Baseline 52, 90 degrees 10/31/15   Period Weeks   Status Achieved     CC Short Term Goal  #3   Title Pt will be independent in stating lymphedema risk reduction practices to decrease risk of lymphedema   Status Achieved             Long Term Clinic Goals - 11/30/15 1202      CC Long Term Goal  #1   Title Pt will receive appropriate compression garment for management and to decrease risk of RUE edema   Baseline 11/16/15- pt states she has not received this - will resend facesheet, 11/23/15- pt awaiting call from fitter, pt was measured 11/24/15   Status Partially Met     CC Long Term Goal  #2   Title Pt will demonstrate 165 degrees of right shoulder flexion to allow pt to reach items overhead   Baseline 71, 108 degrees 10/31/15, 11/16/15 - 145 degrees, 11/23/15- 165 degrees    Status Achieved     CC Long Term Goal  #3   Title Pt will demonstrate 165 degrees of shoulder abduction to allow pt to reach items out to sides   Baseline 52, 90 degrees 10/31/15, 11/16/15- 125 degrees, 11/23/15- 143 degrees, 162 degrees 11/30/15   Status On-going      CC Long Term Goal  #4   Title Pt will demonstrate at least 50 degrees of right shoulder external rotation to all her to return to prior level of function   Status Achieved     CC Long Term Goal  #5   Title Pt will be  independent in a home exercise program for continued strengthening and stretching    Status Partially Met     CC Long Term Goal  #6   Title Pt will improve Quick Dash impairment percentage to less than or equal to 20 percent for improved functional use of RUE   Status Achieved            Plan - 11/30/15 1201    Clinical Impression Statement Pt continues with good progress reporting she has been doing new scapular stabilization exercises issued last visit and reports she feels these are helping. Progressed HEP to include standing shoulder strengthening exercises but also instructed pt not to perform these every day. She demonstrated good technique with these and her A/ROM abduction had improved from last week as well. Ptis progressing very well with goals and is very pleased with her progress.    Rehab Potential Excellent   Clinical Impairments Affecting Rehab Potential to begin chemo 10/28/15; had second chemo 11/22/15   PT Frequency 2x / week   PT Duration 8 weeks   PT Treatment/Interventions Manual lymph drainage;Manual techniques;Therapeutic exercise;Vasopneumatic Device;Taping;Passive range of motion;Patient/family education;DME Instruction;ADLs/Self Care Home Management;Scar mobilization;Compression bandaging;Electrical Stimulation   PT Next Visit Plan Cont AAROM/PROM/AROM of Rt shoulder, add to and review HEP prn. Pt might possibly ready for D/C in next 2-3 visits.    Consulted and Agree with Plan of Care Patient      Patient will benefit from skilled therapeutic intervention in order to improve the following deficits and impairments:  Decreased range of motion, Pain, Impaired UE functional use, Increased fascial restricitons, Decreased strength, Decreased knowledge  of use of DME, Decreased knowledge of precautions, Decreased scar mobility, Increased edema  Visit Diagnosis: Pain in right shoulder  Stiffness of right shoulder, not elsewhere classified  Postmastectomy lymphedema     Problem List Patient Active Problem List   Diagnosis Date Noted  . Genetic testing 08/16/2015  . Iron deficiency anemia due to chronic blood loss 08/03/2015  . Family history of breast cancer   . Family history of colon cancer   . Family history of prostate cancer   . Breast cancer of lower-outer quadrant of right female breast ([place]) 07/29/2015  . Leiomyoma of uterus, unspecified 08/01/2012  . Irregular menstrual cycle 08/01/2012  . 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  . Anemia 12/22/2010  . Acid reflux 12/22/2010    Otelia Limes, PTA 11/30/2015, 12:08 PM  [place], [place], [address] Phone: [phone]   Fax:  [phone]  Name: Melanie Moss MRN: [id number] Date of Birth: [birth year]

## 2015-12-02 ENCOUNTER — Ambulatory Visit: Payer: BLUE CROSS/BLUE SHIELD | Admitting: Hematology

## 2015-12-05 ENCOUNTER — Ambulatory Visit: Payer: BLUE CROSS/BLUE SHIELD

## 2015-12-05 DIAGNOSIS — M25511 Pain in right shoulder: Secondary | ICD-10-CM

## 2015-12-05 DIAGNOSIS — I972 Postmastectomy lymphedema syndrome: Secondary | ICD-10-CM

## 2015-12-05 DIAGNOSIS — M25611 Stiffness of right shoulder, not elsewhere classified: Secondary | ICD-10-CM

## 2015-12-05 NOTE — Therapy (Signed)
[place], [place], [address] Phone: [phone]   Fax:  [phone]  Physical Therapy Treatment  Patient Details  Name: Melanie Moss MRN: [id number] Date of Birth: 1975/06/14 Referring Provider: Burr Medico  Encounter Date: 12/05/2015      PT End of Session - 12/05/15 1104    Visit Number 9   Number of Visits 17   Date for PT Re-Evaluation 12/15/15   PT Start Time 1021   PT Stop Time 1102   PT Time Calculation (min) 41 min   Activity Tolerance Patient tolerated treatment well   Behavior During Therapy [facility] for tasks assessed/performed      Past Medical History:  Diagnosis Date  . Anemia   . Anxiety   . Breast cancer of lower-outer quadrant of right female breast ([place]) 07/29/2015  . Family history of breast cancer   . Family history of colon cancer   . Family history of prostate cancer   . GERD (gastroesophageal reflux disease)    tums   . Gestational diabetes   . Hyperlipidemia   . Hypertension    not currently on medication  . Preterm labor     Past Surgical History:  Procedure Laterality Date  . AXILLARY LYMPH NODE DISSECTION Right 09/29/2015   Procedure: AXILLARY LYMPH NODE DISSECTION;  Surgeon: Excell Seltzer, MD;  Location: [place];  Service: General;  Laterality: Right;  . BREAST RECONSTRUCTION WITH PLACEMENT OF TISSUE EXPANDER AND FLEX HD (ACELLULAR HYDRATED DERMIS) Right 09/29/2015   Procedure: BREAST RECONSTRUCTION WITH PLACEMENT OF TISSUE EXPANDER AND FLEX HD (ACELLULAR HYDRATED DERMIS);  Surgeon: Loel Lofty Dillingham, DO;  Location: [place];  Service: Plastics;  Laterality: Right;  . CERVICAL CERCLAGE  02/19/2011   Procedure: CERCLAGE CERVICAL;  Surgeon: Agnes Lawrence, MD;  Location: [facility];  Service: Gynecology;  Laterality: N/A;  . DILATION AND CURETTAGE OF UTERUS    . MASTECTOMY WITH AXILLARY LYMPH NODE DISSECTION Right 09/29/2015  . PORTACATH PLACEMENT N/A 09/29/2015   Procedure: INSERTION PORT-A-CATH;  Surgeon: Excell Seltzer, MD;  Location: [place];  Service: General;  Laterality: N/A;  . SIMPLE MASTECTOMY WITH AXILLARY SENTINEL NODE BIOPSY Right 09/29/2015   Procedure: RIGHT TOTAL  MASTECTOMY WITH AXILLARY SENTINEL NODE BIOPSY;  Surgeon: Excell Seltzer, MD;  Location: [place];  Service: General;  Laterality: Right;    There were no vitals filed for this visit.      Subjective Assessment - 12/05/15 1024    Subjective Nothing new. Doing pretty good at home with my exercises and progress overall. Reaching to the back of the car has gotten easier now which was a big deal becasue I was having to pull over to hand my son something in the back.    Pertinent History June 8th right side mastectomy and lymph node dissection, pt starts taking chemo next Friday, pt has to see plastic surgeon for implant tomorrow, pt currently has an expander on R, pt to begin radiation after chemotherapy, (1) ER 95% positive, PR 95% positive, Ki-67 30%, HER-2 positive with ratio 2.04, copy # 5.20, (2) ER 90% positive, PR 95% positive, Ki-67 10%, HER-2 negative.   Patient Stated Goals to get as strong as I can as quick as I can   Currently in Pain? No/denies                         [facility] Adult PT Treatment/Exercise - 12/05/15 0001  Shoulder Exercises: Standing   Flexion Strengthening;Both;10 reps;Theraband  Narrow and wide grip, 10 times each; back against wall   Theraband Level (Shoulder Flexion) Level 2 (Red)   ABduction Strengthening;Both;10 reps;Theraband  back against wall   Theraband Level (Shoulder ABduction) Level 2 (Red)     Shoulder Exercises: Pulleys   Flexion 2 minutes   ABduction 2 minutes     Shoulder Exercises: Therapy Ball   Flexion 10 reps  With forward lean into top of stretch   ABduction 10 reps  Rt UE with lean into top of stretch     Manual Therapy   Manual Therapy Passive ROM   Passive ROM In Supine to Rt shoulder into gentle  flexion, abduction, er, to pts tolerance                   Short Term Clinic Goals - 11/16/15 1144      CC Short Term Goal  #1   Title Pt to demonstrate 100 degrees of right shoulder flexion to allow her to reach items overhead.    Time 4   Period Weeks   Status Achieved     CC Short Term Goal  #2   Title Pt to demonstrate 52 degrees of right shoulder abduction to allow pt to reach out to sides.   Baseline 52, 90 degrees 10/31/15   Period Weeks   Status Achieved     CC Short Term Goal  #3   Title Pt will be independent in stating lymphedema risk reduction practices to decrease risk of lymphedema   Status Achieved             Long Term Clinic Goals - 11/30/15 1202      CC Long Term Goal  #1   Title Pt will receive appropriate compression garment for management and to decrease risk of RUE edema   Baseline 11/16/15- pt states she has not received this - will resend facesheet, 11/23/15- pt awaiting call from fitter, pt was measured 11/24/15   Status Partially Met     CC Long Term Goal  #2   Title Pt will demonstrate 165 degrees of right shoulder flexion to allow pt to reach items overhead   Baseline 71, 108 degrees 10/31/15, 11/16/15 - 145 degrees, 11/23/15- 165 degrees    Status Achieved     CC Long Term Goal  #3   Title Pt will demonstrate 165 degrees of shoulder abduction to allow pt to reach items out to sides   Baseline 52, 90 degrees 10/31/15, 11/16/15- 125 degrees, 11/23/15- 143 degrees, 162 degrees 11/30/15   Status On-going     CC Long Term Goal  #4   Title Pt will demonstrate at least 50 degrees of right shoulder external rotation to all her to return to prior level of function   Status Achieved     CC Long Term Goal  #5   Title Pt will be independent in a home exercise program for continued strengthening and stretching    Status Partially Met     CC Long Term Goal  #6   Title Pt will improve Quick Dash impairment percentage to less than or equal to 20 percent  for improved functional use of RUE   Status Achieved            Plan - 12/05/15 1105    Clinical Impression Statement Pt continues to show steady progress reporting she is able to reach into back of car to get items  to her son which is an improvement as she was having to pull over and get out of car. Her largest complaint now is feels limited with overhead reaching due to strength and ROM. Progressed her HEP to perform scapular series in standing with back aginst wall for OH strengthening to focus on proper shoulder motion. Pt reports having 2 more appointments and therapist and patient agree she will be ready for D/C at that time.   Rehab Potential Excellent   Clinical Impairments Affecting Rehab Potential to begin chemo 10/28/15; had second chemo 11/22/15   PT Frequency 2x / week   PT Duration 8 weeks   PT Treatment/Interventions Manual lymph drainage;Manual techniques;Therapeutic exercise;Vasopneumatic Device;Taping;Passive range of motion;Patient/family education;DME Instruction;ADLs/Self Care Home Management;Scar mobilization;Compression bandaging;Electrical Stimulation   PT Next Visit Plan Cont AAROM/PROM/AROM of Rt shoulder, add to and review HEP prn preparing pt for D/C in next 2 visits.   PT Home Exercise Plan Cont supine dowel shoulder exercises prn for ROM, neural tension stretch on wall, hooklying lower trunk rotation, supine scapular stabilization exercises and also try some of those in standing per todays session   Consulted and Agree with Plan of Care Patient      Patient will benefit from skilled therapeutic intervention in order to improve the following deficits and impairments:  Decreased range of motion, Pain, Impaired UE functional use, Increased fascial restricitons, Decreased strength, Decreased knowledge of use of DME, Decreased knowledge of precautions, Decreased scar mobility, Increased edema  Visit Diagnosis: Pain in right shoulder  Stiffness of right shoulder, not  elsewhere classified  Postmastectomy lymphedema     Problem List Patient Active Problem List   Diagnosis Date Noted  . Genetic testing 08/16/2015  . Iron deficiency anemia due to chronic blood loss 08/03/2015  . Family history of breast cancer   . Family history of colon cancer   . Family history of prostate cancer   . Breast cancer of lower-outer quadrant of right female breast ([place]) 07/29/2015  . Leiomyoma of uterus, unspecified 08/01/2012  . Irregular menstrual cycle 08/01/2012  . 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  . Anemia 12/22/2010  . Acid reflux 12/22/2010    Otelia Limes, PTA 12/05/2015, 11:26 AM  [place], [place], [address] Phone: [phone]   Fax:  [phone]  Name: MACHELE DEIHL MRN: [id number] Date of Birth: 09-05-1975

## 2015-12-07 ENCOUNTER — Ambulatory Visit: Payer: BLUE CROSS/BLUE SHIELD | Admitting: Physical Therapy

## 2015-12-07 DIAGNOSIS — M25511 Pain in right shoulder: Secondary | ICD-10-CM | POA: Diagnosis not present

## 2015-12-07 DIAGNOSIS — M25611 Stiffness of right shoulder, not elsewhere classified: Secondary | ICD-10-CM

## 2015-12-07 NOTE — Therapy (Signed)
[place], [place], [address] Phone: [phone]   Fax:  [phone]  Physical Therapy Treatment  Patient Details  Name: Melanie Moss MRN: [id number] Date of Birth: [birth year] Referring Provider: Burr Medico  Encounter Date: 12/07/2015      PT End of Session - 12/07/15 1153    Visit Number 10   Number of Visits 17   Date for PT Re-Evaluation 12/15/15   PT Start Time 1111   PT Stop Time 1143   PT Time Calculation (min) 32 min   Activity Tolerance Patient tolerated treatment well   Behavior During Therapy [facility] for tasks assessed/performed      Past Medical History:  Diagnosis Date  . Anemia   . Anxiety   . Breast cancer of lower-outer quadrant of right female breast ([place]) 07/29/2015  . Family history of breast cancer   . Family history of colon cancer   . Family history of prostate cancer   . GERD (gastroesophageal reflux disease)    tums   . Gestational diabetes   . Hyperlipidemia   . Hypertension    not currently on medication  . Preterm labor     Past Surgical History:  Procedure Laterality Date  . AXILLARY LYMPH NODE DISSECTION Right 09/29/2015   Procedure: AXILLARY LYMPH NODE DISSECTION;  Surgeon: Excell Seltzer, MD;  Location: [place];  Service: General;  Laterality: Right;  . BREAST RECONSTRUCTION WITH PLACEMENT OF TISSUE EXPANDER AND FLEX HD (ACELLULAR HYDRATED DERMIS) Right 09/29/2015   Procedure: BREAST RECONSTRUCTION WITH PLACEMENT OF TISSUE EXPANDER AND FLEX HD (ACELLULAR HYDRATED DERMIS);  Surgeon: Loel Lofty Dillingham, DO;  Location: [place];  Service: Plastics;  Laterality: Right;  . CERVICAL CERCLAGE  02/19/2011   Procedure: CERCLAGE CERVICAL;  Surgeon: Agnes Lawrence, MD;  Location: [facility];  Service: Gynecology;  Laterality: N/A;  . DILATION AND CURETTAGE OF UTERUS    . MASTECTOMY WITH AXILLARY LYMPH NODE DISSECTION Right 09/29/2015  . PORTACATH PLACEMENT N/A 09/29/2015    Procedure: INSERTION PORT-A-CATH;  Surgeon: Excell Seltzer, MD;  Location: [place];  Service: General;  Laterality: N/A;  . SIMPLE MASTECTOMY WITH AXILLARY SENTINEL NODE BIOPSY Right 09/29/2015   Procedure: RIGHT TOTAL  MASTECTOMY WITH AXILLARY SENTINEL NODE BIOPSY;  Surgeon: Excell Seltzer, MD;  Location: [place];  Service: General;  Laterality: Right;    There were no vitals filed for this visit.      Subjective Assessment - 12/07/15 1114    Subjective Nothing new, but reports she is feeling fine. Feels like reaching overhead is getting better since beginning standing scapular exercises and is ready to discharge today.   Pertinent History June 8th right side mastectomy and lymph node dissection, pt starts taking chemo next Friday, pt has to see plastic surgeon for implant tomorrow, pt currently has an expander on R, pt to begin radiation after chemotherapy, (1) ER 95% positive, PR 95% positive, Ki-67 30%, HER-2 positive with ratio 2.04, copy # 5.20, (2) ER 90% positive, PR 95% positive, Ki-67 10%, HER-2 negative.   Patient Stated Goals to get as strong as I can as quick as I can   Currently in Pain? No/denies            [facility] PT Assessment - 12/07/15 0001      AROM   Right Shoulder Flexion --   Right Shoulder ABduction 172 Degrees  [facility] Adult PT Treatment/Exercise - 12/07/15 0001      Shoulder Exercises: Standing   Horizontal ABduction Strengthening;Both;10 reps;Theraband   Theraband Level (Shoulder Horizontal ABduction) Level 1 (Yellow)   External Rotation Strengthening;Both;10 reps;Theraband   Theraband Level (Shoulder External Rotation) Level 1 (Yellow)   Flexion Strengthening;Both;10 reps;Theraband  Narrow and wide grip, 10 times each; back against wall   Theraband Level (Shoulder Flexion) Level 2 (Red)   Other Standing Exercises Standing bilateral UE D2 with yellow theraband 10 reps     Shoulder Exercises: Pulleys   Flexion 2 minutes    ABduction 2 minutes     Shoulder Exercises: Therapy Ball   Flexion 10 reps  With forward lean into top of stretch   ABduction 10 reps  Rt UE with lean into top of stretch     Manual Therapy   Manual Therapy Passive ROM   Passive ROM In Supine to Rt shoulder into gentle flexion, abduction, er, to pts tolerance                   Short Term Clinic Goals - 12/07/15 1200      CC Short Term Goal  #1   Title Pt to demonstrate 100 degrees of right shoulder flexion to allow her to reach items overhead.    Baseline 71, 108 degrees 10/1015   Time 4   Period Weeks   Status Achieved     CC Short Term Goal  #2   Title Pt to demonstrate 52 degrees of right shoulder abduction to allow pt to reach out to sides.   Baseline 52, 90 degrees 10/31/15   Time 4   Period Weeks   Status Achieved     CC Short Term Goal  #3   Title Pt will be independent in stating lymphedema risk reduction practices to decrease risk of lymphedema   Time 4   Period Weeks   Status Achieved             Long Term Clinic Goals - 12/07/15 1128      CC Long Term Goal  #1   Title Pt will receive appropriate compression garment for management and to decrease risk of RUE edema   Baseline 11/16/15- pt states she has not received this - will resend facesheet, 11/23/15- pt awaiting call from fitter, pt was measured 11/24/15; pt faxed information today and is waiting to hear back   Time 8   Period Weeks   Status Partially Met     CC Long Term Goal  #2   Title Pt will demonstrate 165 degrees of right shoulder flexion to allow pt to reach items overhead   Baseline 71, 108 degrees 10/31/15, 11/16/15 - 145 degrees, 11/23/15- 165 degrees    Time 8   Period Weeks   Status Achieved     CC Long Term Goal  #3   Title Pt will demonstrate 165 degrees of shoulder abduction to allow pt to reach items out to sides   Baseline 52, 90 degrees 10/31/15, 11/16/15- 125 degrees, 11/23/15- 143 degrees, 162 degrees 11/30/15, 172 degress  12/07/15   Time 8   Period Weeks   Status On-going     CC Long Term Goal  #4   Title Pt will demonstrate at least 50 degrees of right shoulder external rotation to all her to return to prior level of function   Baseline unable to assess due to pain, 67 degrees 10/31/15   Time 8   Period  Weeks   Status Achieved     CC Long Term Goal  #5   Title Pt will be independent in a home exercise program for continued strengthening and stretching    Time 8   Period Weeks   Status Achieved     CC Long Term Goal  #6   Title Pt will improve Quick Dash impairment percentage to less than or equal to 20 percent for improved functional use of RUE   Baseline 59%, 11/23/15- 6.82% impairment   Time 8   Period Weeks   Status Achieved            Plan - 12/07/15 1154    Clinical Impression Statement Patient demonstrates improved right shoulder AROM with patient reporting improved ability to reach overhead with the addition of standing scapular exercises last session. Therapist progressed exercises to perform bilateral UE D2, external rotation, and horizontal abduction in standing. Patient instructed to perform these exercises at home as part of HEP with patient ready to discharge today.   Rehab Potential Excellent   Clinical Impairments Affecting Rehab Potential to begin chemo 10/28/15; had second chemo 11/22/15   PT Treatment/Interventions Manual techniques;Therapeutic exercise;Passive range of motion;Patient/family education   PT Home Exercise Plan Cont supine dowel shoulder exercises prn for ROM, neural tension stretch on wall, hooklying lower trunk rotation, standing scapular stabilization exercises   Consulted and Agree with Plan of Care Patient      Patient will benefit from skilled therapeutic intervention in order to improve the following deficits and impairments:  Decreased range of motion, Pain, Impaired UE functional use, Increased fascial restricitons, Decreased strength, Decreased knowledge of  use of DME, Decreased knowledge of precautions, Decreased scar mobility, Increased edema  Visit Diagnosis: Stiffness of right shoulder, not elsewhere classified     Problem List Patient Active Problem List   Diagnosis Date Noted  . Genetic testing 08/16/2015  . Iron deficiency anemia due to chronic blood loss 08/03/2015  . Family history of breast cancer   . Family history of colon cancer   . Family history of prostate cancer   . Breast cancer of lower-outer quadrant of right female breast ([place]) 07/29/2015  . Leiomyoma of uterus, unspecified 08/01/2012  . Irregular menstrual cycle 08/01/2012  . 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  . Anemia 12/22/2010  . Acid reflux 12/22/2010    Charlene Brooke 12/07/2015, 12:09 PM  [place], [place], [address] Phone: [phone]   Fax:  [phone]  Name: STEHANIE EKSTROM MRN: [id number] Date of Birth: [birth year]   PHYSICAL THERAPY DISCHARGE SUMMARY  Visits from Start of Care: 10  Current functional level related to goals / functional outcomes: Goals met except for patient still awaiting compression garment.   Remaining deficits: Patient still slightly limited in right shoulder AROM, which should improve with home exercise program.   Education / Equipment: HEP with red and yellow theraband.  Plan: Patient agrees to discharge.  Patient goals were met. Patient is being discharged due to meeting the stated rehab goals.  ?????     Saverio Danker, SPT  Read, reviewed, edited and agree with student's findings and recommendations.   This entire session was guided, instructed, and directly supervised by Serafina Royals, PT.  Serafina Royals, PT 12/07/15 12:48 PM

## 2015-12-12 ENCOUNTER — Ambulatory Visit: Payer: BLUE CROSS/BLUE SHIELD | Admitting: Physical Therapy

## 2015-12-13 ENCOUNTER — Encounter: Payer: Self-pay | Admitting: Hematology

## 2015-12-13 ENCOUNTER — Ambulatory Visit (HOSPITAL_BASED_OUTPATIENT_CLINIC_OR_DEPARTMENT_OTHER): Payer: BLUE CROSS/BLUE SHIELD | Admitting: Hematology

## 2015-12-13 ENCOUNTER — Ambulatory Visit: Payer: BLUE CROSS/BLUE SHIELD

## 2015-12-13 ENCOUNTER — Other Ambulatory Visit (HOSPITAL_BASED_OUTPATIENT_CLINIC_OR_DEPARTMENT_OTHER): Payer: BLUE CROSS/BLUE SHIELD

## 2015-12-13 ENCOUNTER — Ambulatory Visit (HOSPITAL_BASED_OUTPATIENT_CLINIC_OR_DEPARTMENT_OTHER): Payer: BLUE CROSS/BLUE SHIELD

## 2015-12-13 VITALS — BP 149/85 | HR 88

## 2015-12-13 DIAGNOSIS — Z5111 Encounter for antineoplastic chemotherapy: Secondary | ICD-10-CM

## 2015-12-13 DIAGNOSIS — C773 Secondary and unspecified malignant neoplasm of axilla and upper limb lymph nodes: Secondary | ICD-10-CM

## 2015-12-13 DIAGNOSIS — C50511 Malignant neoplasm of lower-outer quadrant of right female breast: Secondary | ICD-10-CM

## 2015-12-13 DIAGNOSIS — D509 Iron deficiency anemia, unspecified: Secondary | ICD-10-CM

## 2015-12-13 DIAGNOSIS — R21 Rash and other nonspecific skin eruption: Secondary | ICD-10-CM

## 2015-12-13 DIAGNOSIS — Z170 Estrogen receptor positive status [ER+]: Secondary | ICD-10-CM

## 2015-12-13 DIAGNOSIS — I10 Essential (primary) hypertension: Secondary | ICD-10-CM

## 2015-12-13 DIAGNOSIS — Z5112 Encounter for antineoplastic immunotherapy: Secondary | ICD-10-CM

## 2015-12-13 DIAGNOSIS — D6481 Anemia due to antineoplastic chemotherapy: Secondary | ICD-10-CM

## 2015-12-13 DIAGNOSIS — D500 Iron deficiency anemia secondary to blood loss (chronic): Secondary | ICD-10-CM

## 2015-12-13 LAB — COMPREHENSIVE METABOLIC PANEL
ALT: 13 U/L (ref 0–55)
AST: 23 U/L (ref 5–34)
Albumin: 3.6 g/dL (ref 3.5–5.0)
Alkaline Phosphatase: 61 U/L (ref 40–150)
Anion Gap: 9 mEq/L (ref 3–11)
BUN: 10.2 mg/dL (ref 7.0–26.0)
CO2: 24 mEq/L (ref 22–29)
Calcium: 9.2 mg/dL (ref 8.4–10.4)
Chloride: 107 mEq/L (ref 98–109)
Creatinine: 0.7 mg/dL (ref 0.6–1.1)
EGFR: >90 mL/min/{1.73_m2} (ref >90)
Glucose: 99 mg/dl (ref 70–140)
Potassium: 3.5 mEq/L (ref 3.5–5.1)
Sodium: 140 mEq/L (ref 136–145)
Total Bilirubin: 0.31 mg/dL (ref 0.20–1.20)
Total Protein: 7.2 g/dL (ref 6.4–8.3)

## 2015-12-13 LAB — CBC WITH DIFFERENTIAL/PLATELET
BASO%: 0.4 % (ref 0.0–2.0)
Basophils Absolute: 0 10*3/uL (ref 0.0–0.1)
EOS%: 0.1 % (ref 0.0–7.0)
Eosinophils Absolute: 0 10*3/uL (ref 0.0–0.5)
HCT: 31.6 % — ABNORMAL LOW (ref 34.8–46.6)
HGB: 10.5 g/dL — ABNORMAL LOW (ref 11.6–15.9)
LYMPH%: 25.2 % (ref 14.0–49.7)
MCH: 29.1 pg (ref 25.1–34.0)
MCHC: 33.2 g/dL (ref 31.5–36.0)
MCV: 87.5 fL (ref 79.5–101.0)
MONO#: 0.3 10*3/uL (ref 0.1–0.9)
MONO%: 4.5 % (ref 0.0–14.0)
NEUT#: 5 10*3/uL (ref 1.5–6.5)
NEUT%: 69.8 % (ref 38.4–76.8)
Platelets: 162 10*3/uL (ref 145–400)
RBC: 3.61 10*6/uL — ABNORMAL LOW (ref 3.70–5.45)
RDW: 14.1 % (ref 11.2–14.5)
WBC: 7.2 10*3/uL (ref 3.9–10.3)
lymph#: 1.8 10*3/uL (ref 0.9–3.3)
nRBC: 0 % (ref 0–0)

## 2015-12-13 LAB — IRON AND TIBC
%SAT: 23 % (ref 21–57)
Iron: 54 ug/dL (ref 41–142)
TIBC: 238 ug/dL (ref 236–444)
UIBC: 184 ug/dL (ref 120–384)

## 2015-12-13 LAB — FERRITIN: Ferritin: 129 ng/ml (ref 9–269)

## 2015-12-13 MED ORDER — ACETAMINOPHEN 325 MG PO TABS
ORAL_TABLET | ORAL | Status: AC
  Filled 2015-12-13: qty 2

## 2015-12-13 MED ORDER — PALONOSETRON HCL INJECTION 0.25 MG/5ML
0.2500 mg | Freq: Once | INTRAVENOUS | Status: AC
  Administered 2015-12-13: 0.25 mg via INTRAVENOUS

## 2015-12-13 MED ORDER — PERTUZUMAB CHEMO INJECTION 420 MG/14ML
420.0000 mg | Freq: Once | INTRAVENOUS | Status: AC
  Administered 2015-12-13: 420 mg via INTRAVENOUS
  Filled 2015-12-13: qty 14

## 2015-12-13 MED ORDER — HEPARIN SOD (PORK) LOCK FLUSH 100 UNIT/ML IV SOLN
500.0000 [IU] | Freq: Once | INTRAVENOUS | Status: AC | PRN
  Administered 2015-12-13: 500 [IU]
  Filled 2015-12-13: qty 5

## 2015-12-13 MED ORDER — DIPHENHYDRAMINE HCL 25 MG PO CAPS
50.0000 mg | ORAL_CAPSULE | Freq: Once | ORAL | Status: AC
  Administered 2015-12-13: 50 mg via ORAL

## 2015-12-13 MED ORDER — SODIUM CHLORIDE 0.9 % IV SOLN
75.0000 mg/m2 | Freq: Once | INTRAVENOUS | Status: AC
  Administered 2015-12-13: 130 mg via INTRAVENOUS
  Filled 2015-12-13: qty 13

## 2015-12-13 MED ORDER — SODIUM CHLORIDE 0.9% FLUSH
10.0000 mL | INTRAVENOUS | Status: DC | PRN
  Administered 2015-12-13: 10 mL
  Filled 2015-12-13: qty 10

## 2015-12-13 MED ORDER — SODIUM CHLORIDE 0.9 % IV SOLN
10.0000 mg | Freq: Once | INTRAVENOUS | Status: AC
  Administered 2015-12-13: 10 mg via INTRAVENOUS
  Filled 2015-12-13: qty 1

## 2015-12-13 MED ORDER — TRASTUZUMAB CHEMO 150 MG IV SOLR
6.0000 mg/kg | Freq: Once | INTRAVENOUS | Status: AC
  Administered 2015-12-13: 399 mg via INTRAVENOUS
  Filled 2015-12-13: qty 19

## 2015-12-13 MED ORDER — DIPHENHYDRAMINE HCL 25 MG PO CAPS
ORAL_CAPSULE | ORAL | Status: AC
  Filled 2015-12-13: qty 2

## 2015-12-13 MED ORDER — ACETAMINOPHEN 325 MG PO TABS
650.0000 mg | ORAL_TABLET | Freq: Once | ORAL | Status: AC
  Administered 2015-12-13: 650 mg via ORAL

## 2015-12-13 MED ORDER — SODIUM CHLORIDE 0.9 % IV SOLN
720.0000 mg | Freq: Once | INTRAVENOUS | Status: AC
  Administered 2015-12-13: 720 mg via INTRAVENOUS
  Filled 2015-12-13: qty 72

## 2015-12-13 MED ORDER — SODIUM CHLORIDE 0.9 % IV SOLN
Freq: Once | INTRAVENOUS | Status: AC
  Administered 2015-12-13: 11:00:00 via INTRAVENOUS

## 2015-12-13 MED ORDER — PALONOSETRON HCL INJECTION 0.25 MG/5ML
INTRAVENOUS | Status: AC
  Filled 2015-12-13: qty 5

## 2015-12-13 NOTE — Patient Instructions (Signed)
[facility] Discharge Instructions for Patients Receiving Chemotherapy  Today you received the following chemotherapy agents herceptin/perjeta/taxotere/carboplatin   To help prevent nausea and vomiting after your treatment, we encourage you to take your nausea medication as directed  If you develop nausea and vomiting that is not controlled by your nausea medication, call the clinic.   BELOW ARE SYMPTOMS THAT SHOULD BE REPORTED IMMEDIATELY:  *FEVER GREATER THAN 100.5 F  *CHILLS WITH OR WITHOUT FEVER  NAUSEA AND VOMITING THAT IS NOT CONTROLLED WITH YOUR NAUSEA MEDICATION  *UNUSUAL SHORTNESS OF BREATH  *UNUSUAL BRUISING OR BLEEDING  TENDERNESS IN MOUTH AND THROAT WITH OR WITHOUT PRESENCE OF ULCERS  *URINARY PROBLEMS  *BOWEL PROBLEMS  UNUSUAL RASH Items with * indicate a potential emergency and should be followed up as soon as possible.  Feel free to call the clinic you have any questions or concerns. The clinic phone number is [phone].  

## 2015-12-13 NOTE — Progress Notes (Signed)
[place]  [phone] [phone]  Clinic Follow Up Note   Patient Care Team: Lucianne Lei, MD as PCP - General (Family Medicine) Excell Seltzer, MD as Consulting Physician (General Surgery) Thea Silversmith, MD as Consulting Physician (Radiation Oncology) 12/13/2015  CHIEF COMPLAINTS:  Follow up right breast cancer  Oncology History   Breast cancer of lower-outer quadrant of right female breast [facility])   Staging form: Breast, AJCC 7th Edition     Clinical stage from 08/03/2015: Stage IA (T1c, N0, M0) - Unsigned     Pathologic stage from 09/29/2015: Stage IIIA (T3(m), N1a, cM0) - Signed by Truitt Merle, MD on 10/17/2015         Breast cancer of lower-outer quadrant of right female breast ([place])   07/25/2015 Mammogram    diagnostic mammogram and ultrasound showed a 11 mm mass in the 8:00 position of the right breast, small irregular lesion in the retroareolar regio of the right breast, contiguous with the dermis      07/26/2015 Initial Diagnosis    Breast cancer of lower-outer quadrant of right female breast ([place])      07/26/2015 Initial Biopsy    right breast needle core biopsy at 8:00 position (1), invasive ductal carcinoma, grade 3, 6:30 o'clock position invasive (2) and in situ ductal carcinoma, grade 1-2, lymphovascular invasion is identified      07/26/2015 Receptors her2    (1) ER 95% positive, PR 95% positive, Ki-67 30%, HER-2 positive with ratio 2.04, copy # 5.20, (2) ER 90% positive, PR 95% positive, Ki-67 10%, HER-2 negative.      08/02/2015 Imaging    breast MRI showed extensive mass  and non-mass enhancement in the lower outer quadrant of the right breast  spanning 13 cm, focal enhancement within the right nipple, at least 3-4 abnormal appearing right axillary lymph node.      09/29/2015 Surgery    right mastectomy and axillar node dissection       09/29/2015 Pathology Results    Right mastectomy and axillary node dissection showed  multifocal mammary carcinoma with ductal and lobular features, tumor size 13 cm, tubal 1 cm, 1.9 cm, 2.5 cm, grade 2-3, anterior margins were positive, 2 nodes with metastasis and one with isolated cells      11/01/2015 -  Chemotherapy          HISTORY OF PRESENTING ILLNESS (08/03/2015):  Melanie Moss 40 y.o. female is here because of Her newly diagnosed right breast cancer. She is accompanied by her fianc, sister and mother to our multidisciplinary breast clinic today.  She felt a breast lump and noticed mild intermittent breast pain 2 weeks ago, no nipple discharge or skin change. She also has moderate fatigue and dyspnea on moderate to heavy exertion for a few months, no weight loss, no other pain or other symptoms.  She had normal mammgram 6 years ago, for a small lump which felt to be benign and no biopsy was done.    She has iregular period, twice a month, it lasts about 5-7 days, heavy for 3 days, she chanes 6-7 times a day. She was found to be anemic recently, and has started taking oral iron pill a few weeks ago, tolerates well.  GYN HISTORY  Menarchal: 10  LMP: 07/29/2015 Contraceptive: no  HRT: n/a  G4P1: 3 miscarrage, one 76 yo son, no plan to have more children   CURRENT THERAPY: Adjuvant chemo TCHP every 3 weeks, started on 11/01/2015  INTERIM HISTORY:  Ceniya returns for follow-up and Third cycle chemotherapy. She has been tolerating treatment overall well. She did have moderate fatigue, nausea, but recovered well. She has good appetite and eating well, she has gained 5 pounds since she started chemotherapy. She is less active than usual, but able to function well at home. No fever or chills. Her blood pressure has been quite high lately, she did have mild headache, no chest pain or dyspnea. She used to be on antihypertension medication, but has been off for a few years.  MEDICAL HISTORY:  Past Medical History:  Diagnosis Date  . Anemia   . Anxiety   . Breast cancer of  lower-outer quadrant of right female breast ([place]) 07/29/2015  . Family history of breast cancer   . Family history of colon cancer   . Family history of prostate cancer   . GERD (gastroesophageal reflux disease)    tums   . Gestational diabetes   . Hyperlipidemia   . Hypertension    not currently on medication  . Preterm labor     SURGICAL HISTORY: Past Surgical History:  Procedure Laterality Date  . AXILLARY LYMPH NODE DISSECTION Right 09/29/2015   Procedure: AXILLARY LYMPH NODE DISSECTION;  Surgeon: Excell Seltzer, MD;  Location: [facility];  Service: General;  Laterality: Right;  . BREAST RECONSTRUCTION WITH PLACEMENT OF TISSUE EXPANDER AND FLEX HD (ACELLULAR HYDRATED DERMIS) Right 09/29/2015   Procedure: BREAST RECONSTRUCTION WITH PLACEMENT OF TISSUE EXPANDER AND FLEX HD (ACELLULAR HYDRATED DERMIS);  Surgeon: Loel Lofty Dillingham, DO;  Location: [facility];  Service: Plastics;  Laterality: Right;  . CERVICAL CERCLAGE  02/19/2011   Procedure: CERCLAGE CERVICAL;  Surgeon: Agnes Lawrence, MD;  Location: [facility];  Service: Gynecology;  Laterality: N/A;  . DILATION AND CURETTAGE OF UTERUS    . MASTECTOMY WITH AXILLARY LYMPH NODE DISSECTION Right 09/29/2015  . PORTACATH PLACEMENT N/A 09/29/2015   Procedure: INSERTION PORT-A-CATH;  Surgeon: Excell Seltzer, MD;  Location: [facility];  Service: General;  Laterality: N/A;  . SIMPLE MASTECTOMY WITH AXILLARY SENTINEL NODE BIOPSY Right 09/29/2015   Procedure: RIGHT TOTAL  MASTECTOMY WITH AXILLARY SENTINEL NODE BIOPSY;  Surgeon: Excell Seltzer, MD;  Location: [place] [facility];  Service: General;  Laterality: Right;    SOCIAL HISTORY: Social History   Social History  . Marital status: Single    Spouse name: N/A  . Number of children: 1  . Years of education: N/A   Occupational History  . Not on file.   Social History Main Topics  . Smoking status: Never Smoker  . Smokeless tobacco: Never Used  . Alcohol use No  . Drug use: No  . Sexual activity: Yes    Other Topics Concern  . Not on file   Social History Narrative  . No narrative on file    FAMILY HISTORY: Family History  Problem Relation Age of Onset  . Hypertension Mother   . Hypertension Father   . Prostate cancer Father 33  . Breast cancer Maternal Aunt 1  . Breast cancer Paternal Aunt 59  . Cirrhosis Maternal Uncle   . Prostate cancer Paternal Uncle   . Throat cancer Maternal Aunt     non smoker  . Prostate cancer Paternal Uncle   . Colon cancer Cousin     maternal first cousin dx in his 25s  . Lung cancer Cousin     smoker    ALLERGIES:  has No Known Allergies.  MEDICATIONS:  Current Outpatient Prescriptions  Medication Sig Dispense  Refill  . B Complex-C (SUPER B COMPLEX PO) Take 1 tablet by mouth daily.    . clindamycin (CLINDAGEL) 1 % gel Apply topically 2 (two) times daily. 30 g 1  . diazepam (VALIUM) 2 MG tablet Take 2 mg by mouth every 6 (six) hours as needed for anxiety.   0  . diphenhydrAMINE (BENADRYL) 12.5 MG/5ML elixir Take 5 mLs (12.5 mg total) by mouth every 6 (six) hours as needed for itching. 120 mL 0  . ferrous sulfate 325 (65 FE) MG tablet Take 325 mg by mouth 2 (two) times daily with a meal.    . HYDROcodone-acetaminophen (NORCO/VICODIN) 5-325 MG tablet Take 1 tablet by mouth every 6 (six) hours as needed for severe pain. Reported on 11/01/2015 30 tablet 0  . hydrocortisone 2.5 % cream Apply topically 2 (two) times daily. 30 g 1  . ibuprofen (ADVIL,MOTRIN) 200 MG tablet Take 200 mg by mouth every 6 (six) hours as needed.    . lidocaine-prilocaine (EMLA) cream Apply to affected area once 30 g 3  . Multiple Vitamin (MULTIVITAMIN) tablet Take 1 tablet by mouth daily.    . ondansetron (ZOFRAN) 8 MG tablet Take 1 tablet (8 mg total) by mouth 2 (two) times daily as needed for refractory nausea / vomiting. Start on day 3 after chemo. 30 tablet 1  . ondansetron (ZOFRAN-ODT) 4 MG disintegrating tablet Take 4 mg by mouth every 8 (eight) hours as needed for  nausea or vomiting. Reported on 11/01/2015  0  . polyethylene glycol (MIRALAX / GLYCOLAX) packet Take 17 g by mouth daily as needed for mild constipation. 14 each 0  . prochlorperazine (COMPAZINE) 10 MG tablet Take 1 tablet (10 mg total) by mouth every 6 (six) hours as needed (Nausea or vomiting). 30 tablet 1  . senna (SENOKOT) 8.6 MG TABS tablet Take 1 tablet (8.6 mg total) by mouth 2 (two) times daily. (Patient taking differently: Take 1 tablet by mouth daily as needed. ) 120 each 0   No current facility-administered medications for this visit.    Facility-Administered Medications Ordered in Other Visits  Medication Dose Route Frequency Provider Last Rate Last Dose  . sodium chloride flush (NS) 0.9 % injection 10 mL  10 mL Intracatheter PRN Truitt Merle, MD   10 mL at 12/13/15 1450    REVIEW OF SYSTEMS:   Constitutional: Denies fevers, chills or abnormal night sweats Eyes: Denies blurriness of vision, double vision or watery eyes Ears, nose, mouth, throat, and face: Denies mucositis or sore throat Respiratory: Denies cough, dyspnea or wheezes Cardiovascular: Denies palpitation, chest discomfort or lower extremity swelling Gastrointestinal:  Denies nausea, heartburn or change in bowel habits Skin: Denies abnormal skin rashes Lymphatics: Denies new lymphadenopathy or easy bruising Neurological:Denies numbness, tingling or new weaknesses Behavioral/Psych: Mood is stable, no new changes  All other systems were reviewed with the patient and are negative.  PHYSICAL EXAMINATION: ECOG PERFORMANCE STATUS: 0   Vitals:   12/13/15 1027 12/13/15 1028  BP: (!) 159/105 (!) 164/99  Pulse:  77  Resp:    Temp:     Filed Weights   12/13/15 1008  Weight: 150 lb 12.8 oz (68.4 kg)    GENERAL:alert, no distress and comfortable SKIN: skin color, texture, turgor are normal, Several scattered acne-like rash in her upper front chest.  EYES: normal, conjunctiva are pink and non-injected, sclera  clear OROPHARYNX:no exudate, no erythema and lips, buccal mucosa, and tongue normal  NECK: supple, thyroid normal size, non-tender, without nodularity  LYMPH:  no palpable lymphadenopathy in the cervical, axillary or inguinal LUNGS: clear to auscultation and percussion with normal breathing effort HEART: regular rate & rhythm and no murmurs and no lower extremity edema ABDOMEN:abdomen soft, non-tender and normal bowel sounds Musculoskeletal:no cyanosis of digits and no clubbing  PSYCH: alert & oriented x 3 with fluent speech NEURO: no focal motor/sensory deficits Breasts: Breast inspection showed them to be symmetrical with no skin change or nipple discharge.  Status post right mastectomy, surgical scar has healed well, no significant discharge or skin erythema. Palpation of the left breast and bilateral axilla showed no palpable mass.  LABORATORY DATA:  I have reviewed the data as listed CBC Latest Ref Rng & Units 12/13/2015 11/21/2015 11/09/2015  WBC 3.9 - 10.3 10e3/uL 7.2 6.5 38.3(H)  Hemoglobin 11.6 - 15.9 g/dL 10.5(L) 10.8(L) 11.8  Hematocrit 34.8 - 46.6 % 31.6(L) 33.0(L) 36.4  Platelets 145 - 400 10e3/uL 162 353 233    CMP Latest Ref Rng & Units 12/13/2015 11/21/2015 11/09/2015  Glucose 70 - 140 mg/dl 99 120 124  BUN 7.0 - 26.0 mg/dL 10.2 8.3 5.2(L)  Creatinine 0.6 - 1.1 mg/dL 0.7 0.7 0.9  Sodium 136 - 145 mEq/L 140 139 141  Potassium 3.5 - 5.1 mEq/L 3.5 3.5 3.6  Chloride 101 - 111 mmol/L - - -  CO2 22 - 29 mEq/L '24 27 28  '$ Calcium 8.4 - 10.4 mg/dL 9.2 9.6 9.8  Total Protein 6.4 - 8.3 g/dL 7.2 7.2 7.1  Total Bilirubin 0.20 - 1.20 mg/dL 0.31 <0.30 <0.30  Alkaline Phos 40 - 150 U/L 61 67 107  AST 5 - 34 U/L 23 33 32  ALT 0 - 55 U/L 13 33 20    PATHOLOGY: Diagnosis 09/29/2015 1. Lymph node, sentinel, biopsy, Right axillary #1 - ONE BENIGN LYMPH NODE WITH NO TUMOR SEEN (0/1). 2. Lymph node, sentinel, biopsy, Right axillary #2 - ONE LYMPH NODE POSITIVE FOR METASTATIC MAMMARY  CARCINOMA (1/1). 3. Lymph node, sentinel, biopsy, Right axillary #4 - ONE BENIGN LYMPH NODE WITH NO TUMOR SEEN (0/1). 4. 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 5. 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 - LARGEST FOCUS OF INVASIVE MAMMARY CARCINOMA IS GRADE 2, SHOWS ASSOCIATED LOBULAR CARCINOMA IN SITU AND MEASURES 13 CM IN GREATEST DIMENSION. - SECOND LARGEST FOCUS SHOWS INVASIVE GRADE 3 MAMMARY CARCINOMA AND SPANS 2.1 CM IN GREATEST DIMENSION. - THIRD LARGEST FOCUS SHOWS INVASIVE GRADE 2 MAMMARY CARCINOMA WITH LOBULAR CARCINOMA IN SITU AND MEASURES 1.9 CM IN GREATEST DIMENSION. - SMALLEST FOCUS SHOWS INVASIVE GRADE 3 MAMMARY CARCINOMA AND MEASURES 0.5 CM IN GREATEST DIMENSION. - ANTERIOR INFERIOR SOFT TISSUE MARGIN DEMONSTRATES BROAD POSITIVITY FOR INVASIVE MAMMARY CARCINOMA. - OTHER MARGINS ARE NEGATIVE. - SEE ONCOLOGY TEMPLATE. 6. Lymph node, sentinel, biopsy, Right axillary #3 - ONE BENIGN LYMPH NODE WITH NO TUMOR SEEN (0/1). 7. Lymph node, sentinel, biopsy, Right axillary #6 - ONE LYMPH NODE WITH ISOLATED TUMOR CELLS. 8. Lymph nodes, regional resection, Right axillary contents - ELEVEN BENIGN LYMPH NODES WITH NO TUMOR SEEN (0/11). Specimen, including laterality and lymph node sampling (sentinel, non-sentinel): Right breast with right sentinel and non-sentinel lymph nodes. Procedure: Right modified mastectomy with sentinel lymph node biopsy and axillary dissection. Histologic type: Multifocal mammary carcinoma with ductal and lobular features. Largest focus (13 cm): Grade: 2. Tubule formation: 3. Nuclear pleomorphism: 2. Mitotic: 1. Second largest focus (  2.1 cm): Grade: 3. Tubule formation: 3. Nuclear pleomorphism: 2. Mitotic: 3. Third largest focus (1.9 cm): Grade: 2. Tubule formation: 3. Nuclear  pleomorphism: 2. Mitotic: 1. Smallest focus (0.5 cm): Grade: 3. Tubule formation: 3. Nuclear pleomorphism: 2. Mitotic: 3. Tumor size (gross measurements): 13 cm, 2.1 cm, 1.9 cm and 0.5 cm. Margins: Invasive, distance to closest margin: Invasive mammary carcinoma broadly involves the anterior inferior soft tissue margin. In-situ, distance to closest margin: Margin uninvolved by in situ carcinoma. If margin positive, focally or broadly: Broadly positive. Lymphovascular invasion: Yes, identified. Ductal carcinoma in situ: Not identified. Grade: Not applicable. Extensive intraductal component: Not applicable. Lobular neoplasia: Yes, lobular carcinoma in situ is present. Tumor focality: Multifocal. Treatment effect: Not applicable. If present, treatment effect in breast tissue, lymph nodes or both: Not applicable. Extent of tumor: Skin: Not involved. Nipple: Not involved. Skeletal muscle: Not received. Lymph nodes: Examined: 6 Sentinel. 11 Non-sentinel. 17 Total. Lymph nodes with metastasis: 2 with metastasis and 1 with isolated tumor cells. Isolated tumor cells (< 0.2 mm): 1. Micrometastasis: (> 0.2 mm and < 2.0 mm): 0. Macrometastasis: (> 2.0 mm): 2. Extracapsular extension: Not identified.  Breast prognostic profile: Performed on previous case, [id number]. Right 8 o'clock biopsy 12 cm from the nipple: Estrogen receptor: 95%, positive. Progesterone receptor: 95%, positive. Her-2 neu: 2.04 ratio, positive. Ki-67: 30%. Right breast needle core biopsy 6:30 o'clock position 1 cm from the nipple: Estrogen receptor: 90%, positive. Progesterone receptor: 95%, positive. Her-2 neu: 1.39 ratio, negative. Ki-67: 10%. Non-neoplastic breast: No significant non-neoplastic breast findings.  TNM: pT3(m), pN1a. Comments: A cytokeratin AE1/AE3 immunohistochemical stain is performed on eight blocks containing lymph node tissue (eight stains total). The morphology coupled with the  staining pattern is consistent with the above findings. An E-cadherin immunohistochemical stain is also performed on four blocks from the right simple mastectomy specimen. The staining pattern coupled with the morphology is consistent with the above findings. As there was Her-2 neu positivity in one of the biopsies and both biopsies were positive for estrogen receptor, a repeat breast prognostic profile will not be performed unless otherwise requested. Dr. Colonel Bald has seen selected slides 5E, 4F, 5G, 5J, 5N, 5O, and 5P with agreement that the tumor is a mulitfocal invasive mammary carcinoma with a mixed ductal and lobular phenotype. Dr. Berneice Heinrich has also seen a selected slide (5P) with agreement of the positive anterior inferior margin. (RH:ecj 10/03/2015)   RADIOGRAPHIC STUDIES: I have personally reviewed the radiological images as listed and agreed with the findings in the report. No new scans  Bone scan 10/28/2015 IMPRESSION: Small focus of increased radiotracer uptake within the right lateral skull at the region of the right temporal bone. CT of the head may be considered for further evaluation.  Otherwise no evidence of osseous metastatic disease in the axial skeleton.  CT chest, abdomen and pelvis  10/27/2015 IMPRESSION: Status post right mastectomy and right axillary lymph node dissection.  No findings specific for metastatic disease.  Scattered hepatic cysts measuring up to 12 mm. A single 8 mm hypoenhancing lesion is technically indeterminate, but also likely benign, although motion degraded. Consider attention on follow-up.  CT head with and without contrast 11/07/2015 IMPRESSION: 1. Indeterminate 10-11 mm area of heterogeneous bone mineralization along the right sphenoid wing which may correspond to the recent bone scan finding. This might be physiologic. A Repeat CT head (e.g. in 8-12 weeks, Noncontrast should suffice but with and without contrast could be done if repeat  brain staging is desired)  may be most valuable to evaluate stability. 2. No CT evidence of metastatic disease to the brain. Mild nonspecific white matter changes.   ASSESSMENT & PLAN:  40 year old African-American female, premenopausal, presented with a palpable right breast mass.  1. 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 Her initial biopsy showed 1 triple positive disease, the other was ER/PR positive and HER2 Negative disease. -Given her stage III disease, I recommend staging CT chest, abdomen and pelvis with contrast, and bone scan to ruled out distant metastasis. -We discussed that HER-2 positive breast cancer are more aggressive, with a high risk for recurrence after surgery, especially in the setting of locally advanced stage. I recommend her to have adjuvant chemotherapy TCHP (docetaxel, carboplatin, Herceptin, pejeta) every 3 weeks for total of 6 cycles, followed by Herceptin maintenance therapy to complete 1 year treatment. -Her baseline echo was normal, which was done in April 2017, she will follow-up with cardiologist to monitor her heart during her and her to therapy -I discussed her staging CT and bone scan results, showed no definitive evidence of metastasis. This mild uptake in the right lateral skull on bone scan, and I got a CT head for further evaluation, which showed a small area of heterogeneous bone mineralization, corresponding to the bone scan findings. This is likely physiological. -She had severe back pain and fatigue on day 6 after chemo, probably related to her Neulasta injection, I'll hold it for now. -Lab reviewed, adequate for treatment, will proceed to cycle  3 chemotherapy today.  2. Iron deficient anemia, and chemoinduced anemia  -Her lab tests showed a moderate anemia with hemoglobin 7.8, low MCV 62.7, her serum iron level and transferrin saturation on low, ferritin 25, this is consistent with iron deficient anemia, likely secondary to her menorrhagia. -Her anemia has resolved after IV Feraheme, slightly worse after she started chemotherapy. -I encouraged her to continue oral iron pill  3. Genetic -Given her young age and positive family history, we strongly recommend her to [facility] genetic counseling for genetic testing to ruled out inheritable breast cancer syndrome. She agrees. -Her genetic testing was normal  4.  uncontrolled HTN -She was noticed to have significant elevated blood pressure lately  -I strongly her to check her blood pressure daily in the next few days, and call his primary care physician if she notices persistent hypertension  -she has called her pcp today and scheduled to see Dr. Criss Rosales this Thursday.  6. Skin rash -Secondary to Herceptin in the pejeta, resolved now  -use 2.5% hydrocortisone and 1% clindamycin gel as needed for rash  Plan -Lab results reviewed with her, we'll proceed to cycle 3 chemotherapy today -Start amlodipine 5 mg daily at home and monitor her blood pressure -Return to clinic in 3 weeks for next cycle chemotherapy  All questions were answered. The patient and her family members agree with the above plan. The patient knows to call the clinic with any problems, questions or concerns.  I spent 20 minutes counseling the patient face to face. The total time spent in the appointment was 25 minutes and more than 50% was on counseling.     Truitt Merle, MD 12/13/2015 5:52 PM

## 2015-12-19 ENCOUNTER — Telehealth: Payer: Self-pay | Admitting: Hematology

## 2015-12-19 NOTE — Telephone Encounter (Signed)
APPTS PER 08/22 LOS CONF WITH PATIENT.

## 2015-12-21 ENCOUNTER — Encounter (HOSPITAL_COMMUNITY): Payer: BLUE CROSS/BLUE SHIELD

## 2015-12-23 ENCOUNTER — Telehealth: Payer: Self-pay | Admitting: *Deleted

## 2015-12-23 MED ORDER — LORAZEPAM 0.5 MG PO TABS: 0.5000 mg | ORAL_TABLET | Freq: Every evening | ORAL | 0 refills | Status: DC | PRN

## 2015-12-23 MED ORDER — DEXAMETHASONE 4 MG PO TABS: ORAL_TABLET | ORAL | 0 refills | Status: DC

## 2015-12-23 NOTE — Telephone Encounter (Signed)
Received call from pt this am req something different for nausea b/c meds not working & she feels more nauseated.  Returned call & pt is taking zofran ODT which works better than the oral zofran & has taken compazine but neither helps much.  She state she needs a refill on her zofran.   Discussed with Dr Burr Medico & scripts sent to pharmacy.  Informed to take dex 3-5 days & stop if nausea better.  Instructed to be cautious with the ativan & not to drive & only take at hs b/c could make sleepy, drowsy, & dizzy.

## 2016-01-03 ENCOUNTER — Other Ambulatory Visit (HOSPITAL_BASED_OUTPATIENT_CLINIC_OR_DEPARTMENT_OTHER): Payer: BLUE CROSS/BLUE SHIELD

## 2016-01-03 ENCOUNTER — Encounter: Payer: Self-pay | Admitting: Hematology

## 2016-01-03 ENCOUNTER — Encounter: Payer: Self-pay | Admitting: *Deleted

## 2016-01-03 ENCOUNTER — Ambulatory Visit (HOSPITAL_BASED_OUTPATIENT_CLINIC_OR_DEPARTMENT_OTHER): Payer: BLUE CROSS/BLUE SHIELD | Admitting: Hematology

## 2016-01-03 ENCOUNTER — Ambulatory Visit: Payer: BLUE CROSS/BLUE SHIELD

## 2016-01-03 ENCOUNTER — Ambulatory Visit (HOSPITAL_BASED_OUTPATIENT_CLINIC_OR_DEPARTMENT_OTHER): Payer: BLUE CROSS/BLUE SHIELD

## 2016-01-03 VITALS — BP 140/83 | HR 93 | Temp 97.9°F | Resp 17 | Ht 63.0 in | Wt 150.7 lb

## 2016-01-03 DIAGNOSIS — D509 Iron deficiency anemia, unspecified: Secondary | ICD-10-CM | POA: Diagnosis not present

## 2016-01-03 DIAGNOSIS — Z170 Estrogen receptor positive status [ER+]: Secondary | ICD-10-CM

## 2016-01-03 DIAGNOSIS — C50511 Malignant neoplasm of lower-outer quadrant of right female breast: Secondary | ICD-10-CM

## 2016-01-03 DIAGNOSIS — C773 Secondary and unspecified malignant neoplasm of axilla and upper limb lymph nodes: Secondary | ICD-10-CM | POA: Diagnosis not present

## 2016-01-03 DIAGNOSIS — Z5111 Encounter for antineoplastic chemotherapy: Secondary | ICD-10-CM

## 2016-01-03 DIAGNOSIS — Z5112 Encounter for antineoplastic immunotherapy: Secondary | ICD-10-CM | POA: Diagnosis not present

## 2016-01-03 DIAGNOSIS — D500 Iron deficiency anemia secondary to blood loss (chronic): Secondary | ICD-10-CM

## 2016-01-03 DIAGNOSIS — I10 Essential (primary) hypertension: Secondary | ICD-10-CM

## 2016-01-03 DIAGNOSIS — L270 Generalized skin eruption due to drugs and medicaments taken internally: Secondary | ICD-10-CM

## 2016-01-03 DIAGNOSIS — D6481 Anemia due to antineoplastic chemotherapy: Secondary | ICD-10-CM

## 2016-01-03 LAB — COMPREHENSIVE METABOLIC PANEL
ALT: 16 U/L (ref 0–55)
AST: 22 U/L (ref 5–34)
Albumin: 3.6 g/dL (ref 3.5–5.0)
Alkaline Phosphatase: 60 U/L (ref 40–150)
Anion Gap: 11 mEq/L (ref 3–11)
BUN: 13.9 mg/dL (ref 7.0–26.0)
CO2: 27 mEq/L (ref 22–29)
Calcium: 9.7 mg/dL (ref 8.4–10.4)
Chloride: 104 mEq/L (ref 98–109)
Creatinine: 1.1 mg/dL (ref 0.6–1.1)
EGFR: 71 mL/min/{1.73_m2} — ABNORMAL LOW (ref >90)
Glucose: 100 mg/dl (ref 70–140)
Potassium: 3.4 mEq/L — ABNORMAL LOW (ref 3.5–5.1)
Sodium: 141 mEq/L (ref 136–145)
Total Bilirubin: 0.31 mg/dL (ref 0.20–1.20)
Total Protein: 7.3 g/dL (ref 6.4–8.3)

## 2016-01-03 LAB — CBC WITH DIFFERENTIAL/PLATELET
BASO%: 0.2 % (ref 0.0–2.0)
Basophils Absolute: 0 10*3/uL (ref 0.0–0.1)
EOS%: 0.1 % (ref 0.0–7.0)
Eosinophils Absolute: 0 10*3/uL (ref 0.0–0.5)
HCT: 26.9 % — ABNORMAL LOW (ref 34.8–46.6)
HGB: 9 g/dL — ABNORMAL LOW (ref 11.6–15.9)
LYMPH%: 30 % (ref 14.0–49.7)
MCH: 30.1 pg (ref 25.1–34.0)
MCHC: 33.5 g/dL (ref 31.5–36.0)
MCV: 90 fL (ref 79.5–101.0)
MONO#: 0.7 10*3/uL (ref 0.1–0.9)
MONO%: 8 % (ref 0.0–14.0)
NEUT#: 5.5 10*3/uL (ref 1.5–6.5)
NEUT%: 61.7 % (ref 38.4–76.8)
Platelets: 146 10*3/uL (ref 145–400)
RBC: 2.99 10*6/uL — ABNORMAL LOW (ref 3.70–5.45)
RDW: 13.9 % (ref 11.2–14.5)
WBC: 8.9 10*3/uL (ref 3.9–10.3)
lymph#: 2.7 10*3/uL (ref 0.9–3.3)

## 2016-01-03 LAB — IRON AND TIBC
%SAT: 30 % (ref 21–57)
Iron: 75 ug/dL (ref 41–142)
TIBC: 248 ug/dL (ref 236–444)
UIBC: 173 ug/dL (ref 120–384)

## 2016-01-03 LAB — FERRITIN: Ferritin: 189 ng/ml (ref 9–269)

## 2016-01-03 MED ORDER — SODIUM CHLORIDE 0.9 % IV SOLN
75.0000 mg/m2 | Freq: Once | INTRAVENOUS | Status: DC
  Filled 2016-01-03: qty 13

## 2016-01-03 MED ORDER — ACETAMINOPHEN 325 MG PO TABS
ORAL_TABLET | ORAL | Status: AC
  Filled 2016-01-03: qty 2

## 2016-01-03 MED ORDER — SODIUM CHLORIDE 0.9 % IV SOLN
420.0000 mg | Freq: Once | INTRAVENOUS | Status: AC
  Administered 2016-01-03: 420 mg via INTRAVENOUS
  Filled 2016-01-03: qty 14

## 2016-01-03 MED ORDER — PALONOSETRON HCL INJECTION 0.25 MG/5ML
0.2500 mg | Freq: Once | INTRAVENOUS | Status: AC
  Administered 2016-01-03: 0.25 mg via INTRAVENOUS

## 2016-01-03 MED ORDER — POTASSIUM CHLORIDE ER 20 MEQ PO TBCR: 20.0000 meq | EXTENDED_RELEASE_TABLET | Freq: Every day | ORAL | 1 refills | Status: DC

## 2016-01-03 MED ORDER — ACETAMINOPHEN 325 MG PO TABS
650.0000 mg | ORAL_TABLET | Freq: Once | ORAL | Status: AC
  Administered 2016-01-03: 650 mg via ORAL

## 2016-01-03 MED ORDER — DIPHENHYDRAMINE HCL 25 MG PO CAPS
50.0000 mg | ORAL_CAPSULE | Freq: Once | ORAL | Status: AC
  Administered 2016-01-03: 50 mg via ORAL

## 2016-01-03 MED ORDER — SODIUM CHLORIDE 0.9 % IV SOLN
564.0000 mg | Freq: Once | INTRAVENOUS | Status: AC
  Administered 2016-01-03: 560 mg via INTRAVENOUS
  Filled 2016-01-03: qty 56

## 2016-01-03 MED ORDER — TRASTUZUMAB CHEMO 150 MG IV SOLR
6.0000 mg/kg | Freq: Once | INTRAVENOUS | Status: AC
  Administered 2016-01-03: 399 mg via INTRAVENOUS
  Filled 2016-01-03: qty 19

## 2016-01-03 MED ORDER — SODIUM CHLORIDE 0.9% FLUSH
10.0000 mL | INTRAVENOUS | Status: DC | PRN
  Administered 2016-01-03: 10 mL
  Filled 2016-01-03: qty 10

## 2016-01-03 MED ORDER — DIPHENHYDRAMINE HCL 25 MG PO CAPS
ORAL_CAPSULE | ORAL | Status: AC
  Filled 2016-01-03: qty 2

## 2016-01-03 MED ORDER — SODIUM CHLORIDE 0.9 % IV SOLN
10.0000 mg | Freq: Once | INTRAVENOUS | Status: AC
  Administered 2016-01-03: 10 mg via INTRAVENOUS
  Filled 2016-01-03: qty 1

## 2016-01-03 MED ORDER — SODIUM CHLORIDE 0.9 % IJ SOLN
10.0000 mL | INTRAMUSCULAR | Status: DC | PRN
  Administered 2016-01-03: 10 mL via INTRAVENOUS
  Filled 2016-01-03: qty 10

## 2016-01-03 MED ORDER — SODIUM CHLORIDE 0.9 % IV SOLN
Freq: Once | INTRAVENOUS | Status: AC
  Administered 2016-01-03: 10:00:00 via INTRAVENOUS

## 2016-01-03 MED ORDER — PALONOSETRON HCL INJECTION 0.25 MG/5ML
INTRAVENOUS | Status: AC
  Filled 2016-01-03: qty 5

## 2016-01-03 MED ORDER — HEPARIN SOD (PORK) LOCK FLUSH 100 UNIT/ML IV SOLN
500.0000 [IU] | Freq: Once | INTRAVENOUS | Status: AC | PRN
  Administered 2016-01-03: 500 [IU]
  Filled 2016-01-03: qty 5

## 2016-01-03 NOTE — Progress Notes (Signed)
[place]  [phone] Fax:([phone]  Clinic Follow Up Note   Patient Care Team: Melanie Lei, MD as PCP - General (Family Medicine) Melanie Seltzer, MD as Consulting Physician (General Surgery) Melanie Silversmith, MD as Consulting Physician (Radiation Oncology) 01/03/2016  CHIEF COMPLAINTS:  Follow up right breast cancer  Oncology History   Breast cancer of lower-outer quadrant of right female breast [facility])   Staging form: Breast, AJCC 7th Edition     Clinical stage from 08/03/2015: Stage IA (T1c, N0, M0) - Unsigned     Pathologic stage from 09/29/2015: Stage IIIA (T3(m), N1a, cM0) - Signed by Melanie Merle, MD on 10/17/2015         Breast cancer of lower-outer quadrant of right female breast ([place])   07/25/2015 Mammogram    diagnostic mammogram and ultrasound showed a 11 mm mass in the 8:00 position of the right breast, small irregular lesion in the retroareolar regio of the right breast, contiguous with the dermis      07/26/2015 Initial Diagnosis    Breast cancer of lower-outer quadrant of right female breast ([place])      07/26/2015 Initial Biopsy    right breast needle core biopsy at 8:00 position (1), invasive ductal carcinoma, grade 3, 6:30 o'clock position invasive (2) and in situ ductal carcinoma, grade 1-2, lymphovascular invasion is identified      07/26/2015 Receptors her2    (1) ER 95% positive, PR 95% positive, Ki-67 30%, HER-2 positive with ratio 2.04, copy # 5.20, (2) ER 90% positive, PR 95% positive, Ki-67 10%, HER-2 negative.      08/02/2015 Imaging    breast MRI showed extensive mass  and non-mass enhancement in the lower outer quadrant of the right breast  spanning 13 cm, focal enhancement within the right nipple, at least 3-4 abnormal appearing right axillary lymph node.      09/29/2015 Surgery    right mastectomy and axillar node dissection       09/29/2015 Pathology Results    Right mastectomy and axillary node dissection showed  multifocal mammary carcinoma with ductal and lobular features, tumor size 13 cm, tubal 1 cm, 1.9 cm, 2.5 cm, grade 2-3, anterior margins were positive, 2 nodes with metastasis and one with isolated cells      11/01/2015 -  Chemotherapy          HISTORY OF PRESENTING ILLNESS (08/03/2015):  Melanie Moss 40 y.o. female is here because of Her newly diagnosed right breast cancer. She is accompanied by her fianc, sister and mother to our multidisciplinary breast clinic today.  She felt a breast lump and noticed mild intermittent breast pain 2 weeks ago, no nipple discharge or skin change. She also has moderate fatigue and dyspnea on moderate to heavy exertion for a few months, no weight loss, no other pain or other symptoms.  She had normal mammgram 6 years ago, for a small lump which felt to be benign and no biopsy was done.    She has iregular period, twice a month, it lasts about 5-7 days, heavy for 3 days, she chanes 6-7 times a day. She was found to be anemic recently, and has started taking oral iron pill a few weeks ago, tolerates well.  GYN HISTORY  Menarchal: 10  LMP: 07/29/2015 Contraceptive: no  HRT: n/a  G4P1: 3 miscarrage, one 67 yo son, no plan to have more children   CURRENT THERAPY: Adjuvant chemo TCHP every 3 weeks, started on 11/01/2015  INTERIM HISTORY:  Melanie Moss returns for follow-up and 4th cycle chemotherapy. She has prolonged nausea for over a week after last cycle chemotherapy, she vomited twice. No significant diarrhea. I called in dexamethasone, and her nausea resolved after 1 dose. She has recovered well from the chemotherapy, denies any significant dyspnea, pain or other symptoms. She feels well overall.  MEDICAL HISTORY:  Past Medical History:  Diagnosis Date  . Anemia   . Anxiety   . Breast cancer of lower-outer quadrant of right female breast ([place]) 07/29/2015  . Family history of breast cancer   . Family history of colon cancer   . Family history of prostate  cancer   . GERD (gastroesophageal reflux disease)    tums   . Gestational diabetes   . Hyperlipidemia   . Hypertension    not currently on medication  . Preterm labor     SURGICAL HISTORY: Past Surgical History:  Procedure Laterality Date  . AXILLARY LYMPH NODE DISSECTION Right 09/29/2015   Procedure: AXILLARY LYMPH NODE DISSECTION;  Surgeon: Melanie Seltzer, MD;  Location: [facility];  Service: General;  Laterality: Right;  . BREAST RECONSTRUCTION WITH PLACEMENT OF TISSUE EXPANDER AND FLEX HD (ACELLULAR HYDRATED DERMIS) Right 09/29/2015   Procedure: BREAST RECONSTRUCTION WITH PLACEMENT OF TISSUE EXPANDER AND FLEX HD (ACELLULAR HYDRATED DERMIS);  Surgeon: Loel Lofty Dillingham, DO;  Location: [place];  Service: Plastics;  Laterality: Right;  . CERVICAL CERCLAGE  02/19/2011   Procedure: CERCLAGE CERVICAL;  Surgeon: Agnes Lawrence, MD;  Location: [facility];  Service: Gynecology;  Laterality: N/A;  . DILATION AND CURETTAGE OF UTERUS    . MASTECTOMY WITH AXILLARY LYMPH NODE DISSECTION Right 09/29/2015  . PORTACATH PLACEMENT N/A 09/29/2015   Procedure: INSERTION PORT-A-CATH;  Surgeon: Melanie Seltzer, MD;  Location: [place];  Service: General;  Laterality: N/A;  . SIMPLE MASTECTOMY WITH AXILLARY SENTINEL NODE BIOPSY Right 09/29/2015   Procedure: RIGHT TOTAL  MASTECTOMY WITH AXILLARY SENTINEL NODE BIOPSY;  Surgeon: Melanie Seltzer, MD;  Location: [place];  Service: General;  Laterality: Right;    SOCIAL HISTORY: Social History   Social History  . Marital status: Single    Spouse name: N/A  . Number of children: 1  . Years of education: N/A   Occupational History  . Not on file.   Social History Main Topics  . Smoking status: Never Smoker  . Smokeless tobacco: Never Used  . Alcohol use No  . Drug use: No  . Sexual activity: Yes   Other Topics Concern  . Not on file   Social History Narrative  . No narrative on file    FAMILY HISTORY: Family History  Problem Relation Age of Onset    . Hypertension Mother   . Hypertension Father   . Prostate cancer Father 16  . Breast cancer Maternal Aunt 84  . Breast cancer Paternal Aunt 27  . Cirrhosis Maternal Uncle   . Prostate cancer Paternal Uncle   . Throat cancer Maternal Aunt     non smoker  . Prostate cancer Paternal Uncle   . Colon cancer Cousin     maternal first cousin dx in his 1s  . Lung cancer Cousin     smoker    ALLERGIES:  has No Known Allergies.  MEDICATIONS:  Current Outpatient Prescriptions  Medication Sig Dispense Refill  . amLODipine (NORVASC) 5 MG tablet Take 5 mg by mouth at bedtime.  0  . B Complex-C (SUPER B COMPLEX PO) Take 1 tablet by mouth daily.    Marland Kitchen  clindamycin (CLINDAGEL) 1 % gel Apply topically 2 (two) times daily. 30 g 1  . dexamethasone (DECADRON) 4 MG tablet Take one tab in am daily x 3-5 days & stop if nausea better 20 tablet 0  . diphenhydrAMINE (BENADRYL) 12.5 MG/5ML elixir Take 5 mLs (12.5 mg total) by mouth every 6 (six) hours as needed for itching. 120 mL 0  . ferrous sulfate 325 (65 FE) MG tablet Take 325 mg by mouth 2 (two) times daily with a meal.    . HYDROcodone-acetaminophen (NORCO/VICODIN) 5-325 MG tablet Take 1 tablet by mouth every 6 (six) hours as needed for severe pain. Reported on 11/01/2015 30 tablet 0  . hydrocortisone 2.5 % cream Apply topically 2 (two) times daily. 30 g 1  . ibuprofen (ADVIL,MOTRIN) 200 MG tablet Take 200 mg by mouth every 6 (six) hours as needed.    . lidocaine-prilocaine (EMLA) cream Apply to affected area once 30 g 3  . LORazepam (ATIVAN) 0.5 MG tablet Take 1 tablet (0.5 mg total) by mouth at bedtime as needed for anxiety (nausea). 30 tablet 0  . Multiple Vitamin (MULTIVITAMIN) tablet Take 1 tablet by mouth daily.    . ondansetron (ZOFRAN) 8 MG tablet Take 1 tablet (8 mg total) by mouth 2 (two) times daily as needed for refractory nausea / vomiting. Start on day 3 after chemo. 30 tablet 1  . ondansetron (ZOFRAN-ODT) 4 MG disintegrating tablet Take  4 mg by mouth every 8 (eight) hours as needed for nausea or vomiting. Reported on 11/01/2015  0  . polyethylene glycol (MIRALAX / GLYCOLAX) packet Take 17 g by mouth daily as needed for mild constipation. 14 each 0  . prochlorperazine (COMPAZINE) 10 MG tablet Take 1 tablet (10 mg total) by mouth every 6 (six) hours as needed (Nausea or vomiting). 30 tablet 1  . senna (SENOKOT) 8.6 MG TABS tablet Take 1 tablet (8.6 mg total) by mouth 2 (two) times daily. (Patient taking differently: Take 1 tablet by mouth daily as needed. ) 120 each 0  . valsartan-hydrochlorothiazide (DIOVAN-HCT) 160-12.5 MG tablet Take 1 tablet by mouth every morning.  0  . diazepam (VALIUM) 2 MG tablet Take 2 mg by mouth every 6 (six) hours as needed for anxiety.   0   No current facility-administered medications for this visit.     REVIEW OF SYSTEMS:   Constitutional: Denies fevers, chills or abnormal night sweats Eyes: Denies blurriness of vision, double vision or watery eyes Ears, nose, mouth, throat, and face: Denies mucositis or sore throat Respiratory: Denies cough, dyspnea or wheezes Cardiovascular: Denies palpitation, chest discomfort or lower extremity swelling Gastrointestinal:  Denies nausea, heartburn or change in bowel habits Skin: Denies abnormal skin rashes Lymphatics: Denies new lymphadenopathy or easy bruising Neurological:Denies numbness, tingling or new weaknesses Behavioral/Psych: Mood is stable, no new changes  All other systems were reviewed with the patient and are negative.  PHYSICAL EXAMINATION: ECOG PERFORMANCE STATUS: 0   Vitals:   01/03/16 0858  BP: 140/83  Pulse: 93  Resp: 17  Temp: 97.9 F (36.6 C)   Filed Weights   01/03/16 0858  Weight: 150 lb 11.2 oz (68.4 kg)    GENERAL:alert, no distress and comfortable SKIN: skin color, texture, turgor are normal, Several scattered acne-like rash in her upper front chest.  EYES: normal, conjunctiva are pink and non-injected, sclera  clear OROPHARYNX:no exudate, no erythema and lips, buccal mucosa, and tongue normal  NECK: supple, thyroid normal size, non-tender, without nodularity LYMPH:  no  palpable lymphadenopathy in the cervical, axillary or inguinal LUNGS: clear to auscultation and percussion with normal breathing effort HEART: regular rate & rhythm and no murmurs and no lower extremity edema ABDOMEN:abdomen soft, non-tender and normal bowel sounds Musculoskeletal:no cyanosis of digits and no clubbing  PSYCH: alert & oriented x 3 with fluent speech NEURO: no focal motor/sensory deficits Breasts: Breast inspection showed them to be symmetrical with no skin change or nipple discharge.  Status post right mastectomy and tissue expander placement, surgical scar has healed well, no significant discharge or skin erythema. Palpation of the left breast and bilateral axilla showed no palpable mass.  LABORATORY DATA:  I have reviewed the data as listed CBC Latest Ref Rng & Units 01/03/2016 12/13/2015 11/21/2015  WBC 3.9 - 10.3 10e3/uL 8.9 7.2 6.5  Hemoglobin 11.6 - 15.9 g/dL 9.0(L) 10.5(L) 10.8(L)  Hematocrit 34.8 - 46.6 % 26.9(L) 31.6(L) 33.0(L)  Platelets 145 - 400 10e3/uL 146 162 353    CMP Latest Ref Rng & Units 12/13/2015 11/21/2015 11/09/2015  Glucose 70 - 140 mg/dl 99 120 124  BUN 7.0 - 26.0 mg/dL 10.2 8.3 5.2(L)  Creatinine 0.6 - 1.1 mg/dL 0.7 0.7 0.9  Sodium 136 - 145 mEq/L 140 139 141  Potassium 3.5 - 5.1 mEq/L 3.5 3.5 3.6  Chloride 101 - 111 mmol/L - - -  CO2 22 - 29 mEq/L '24 27 28  '$ Calcium 8.4 - 10.4 mg/dL 9.2 9.6 9.8  Total Protein 6.4 - 8.3 g/dL 7.2 7.2 7.1  Total Bilirubin 0.20 - 1.20 mg/dL 0.31 <0.30 <0.30  Alkaline Phos 40 - 150 U/L 61 67 107  AST 5 - 34 U/L 23 33 32  ALT 0 - 55 U/L 13 33 20    PATHOLOGY: Diagnosis 09/29/2015 1. Lymph node, sentinel, biopsy, Right axillary #1 - ONE BENIGN LYMPH NODE WITH NO TUMOR SEEN (0/1). 2. Lymph node, sentinel, biopsy, Right axillary #2 - ONE LYMPH NODE POSITIVE  FOR METASTATIC MAMMARY CARCINOMA (1/1). 3. Lymph node, sentinel, biopsy, Right axillary #4 - ONE BENIGN LYMPH NODE WITH NO TUMOR SEEN (0/1). 4. 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 5. 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 - LARGEST FOCUS OF INVASIVE MAMMARY CARCINOMA IS GRADE 2, SHOWS ASSOCIATED LOBULAR CARCINOMA IN SITU AND MEASURES 13 CM IN GREATEST DIMENSION. - SECOND LARGEST FOCUS SHOWS INVASIVE GRADE 3 MAMMARY CARCINOMA AND SPANS 2.1 CM IN GREATEST DIMENSION. - THIRD LARGEST FOCUS SHOWS INVASIVE GRADE 2 MAMMARY CARCINOMA WITH LOBULAR CARCINOMA IN SITU AND MEASURES 1.9 CM IN GREATEST DIMENSION. - SMALLEST FOCUS SHOWS INVASIVE GRADE 3 MAMMARY CARCINOMA AND MEASURES 0.5 CM IN GREATEST DIMENSION. - ANTERIOR INFERIOR SOFT TISSUE MARGIN DEMONSTRATES BROAD POSITIVITY FOR INVASIVE MAMMARY CARCINOMA. - OTHER MARGINS ARE NEGATIVE. - SEE ONCOLOGY TEMPLATE. 6. Lymph node, sentinel, biopsy, Right axillary #3 - ONE BENIGN LYMPH NODE WITH NO TUMOR SEEN (0/1). 7. Lymph node, sentinel, biopsy, Right axillary #6 - ONE LYMPH NODE WITH ISOLATED TUMOR CELLS. 8. Lymph nodes, regional resection, Right axillary contents - ELEVEN BENIGN LYMPH NODES WITH NO TUMOR SEEN (0/11). Specimen, including laterality and lymph node sampling (sentinel, non-sentinel): Right breast with right sentinel and non-sentinel lymph nodes. Procedure: Right modified mastectomy with sentinel lymph node biopsy and axillary dissection. Histologic type: Multifocal mammary carcinoma with ductal and lobular features. Largest focus (13 cm): Grade: 2. Tubule formation: 3. Nuclear pleomorphism: 2. Mitotic: 1. Second largest  focus (2.1 cm): Grade: 3. Tubule formation: 3. Nuclear pleomorphism: 2. Mitotic: 3. Third largest focus (1.9 cm): Grade: 2. Tubule  formation: 3. Nuclear pleomorphism: 2. Mitotic: 1. Smallest focus (0.5 cm): Grade: 3. Tubule formation: 3. Nuclear pleomorphism: 2. Mitotic: 3. Tumor size (gross measurements): 13 cm, 2.1 cm, 1.9 cm and 0.5 cm. Margins: Invasive, distance to closest margin: Invasive mammary carcinoma broadly involves the anterior inferior soft tissue margin. In-situ, distance to closest margin: Margin uninvolved by in situ carcinoma. If margin positive, focally or broadly: Broadly positive. Lymphovascular invasion: Yes, identified. Ductal carcinoma in situ: Not identified. Grade: Not applicable. Extensive intraductal component: Not applicable. Lobular neoplasia: Yes, lobular carcinoma in situ is present. Tumor focality: Multifocal. Treatment effect: Not applicable. If present, treatment effect in breast tissue, lymph nodes or both: Not applicable. Extent of tumor: Skin: Not involved. Nipple: Not involved. Skeletal muscle: Not received. Lymph nodes: Examined: 6 Sentinel. 11 Non-sentinel. 17 Total. Lymph nodes with metastasis: 2 with metastasis and 1 with isolated tumor cells. Isolated tumor cells (< 0.2 mm): 1. Micrometastasis: (> 0.2 mm and < 2.0 mm): 0. Macrometastasis: (> 2.0 mm): 2. Extracapsular extension: Not identified.  Breast prognostic profile: Performed on previous case, [id number]. Right 8 o'clock biopsy 12 cm from the nipple: Estrogen receptor: 95%, positive. Progesterone receptor: 95%, positive. Her-2 neu: 2.04 ratio, positive. Ki-67: 30%. Right breast needle core biopsy 6:30 o'clock position 1 cm from the nipple: Estrogen receptor: 90%, positive. Progesterone receptor: 95%, positive. Her-2 neu: 1.39 ratio, negative. Ki-67: 10%. Non-neoplastic breast: No significant non-neoplastic breast findings.  TNM: pT3(m), pN1a. Comments: A cytokeratin AE1/AE3 immunohistochemical stain is performed on eight blocks containing lymph node tissue (eight stains total). The morphology  coupled with the staining pattern is consistent with the above findings. An E-cadherin immunohistochemical stain is also performed on four blocks from the right simple mastectomy specimen. The staining pattern coupled with the morphology is consistent with the above findings. As there was Her-2 neu positivity in one of the biopsies and both biopsies were positive for estrogen receptor, a repeat breast prognostic profile will not be performed unless otherwise requested. Dr. Colonel Bald has seen selected slides 5E, 60F, 5G, 5J, 5N, 5O, and 5P with agreement that the tumor is a mulitfocal invasive mammary carcinoma with a mixed ductal and lobular phenotype. Dr. Berneice Heinrich has also seen a selected slide (5P) with agreement of the positive anterior inferior margin. (RH:ecj 10/03/2015)   RADIOGRAPHIC STUDIES: I have personally reviewed the radiological images as listed and agreed with the findings in the report. No new scans  Bone scan 10/28/2015 IMPRESSION: Small focus of increased radiotracer uptake within the right lateral skull at the region of the right temporal bone. CT of the head may be considered for further evaluation.  Otherwise no evidence of osseous metastatic disease in the axial skeleton.  CT chest, abdomen and pelvis  10/27/2015 IMPRESSION: Status post right mastectomy and right axillary lymph node dissection.  No findings specific for metastatic disease.  Scattered hepatic cysts measuring up to 12 mm. A single 8 mm hypoenhancing lesion is technically indeterminate, but also likely benign, although motion degraded. Consider attention on follow-up.  CT head with and without contrast 11/07/2015 IMPRESSION: 1. Indeterminate 10-11 mm area of heterogeneous bone mineralization along the right sphenoid wing which may correspond to the recent bone scan finding. This might be physiologic. A Repeat CT head (e.g. in 8-12 weeks, Noncontrast should suffice but with and without contrast could  be done if repeat brain staging is  desired) may be most valuable to evaluate stability. 2. No CT evidence of metastatic disease to the brain. Mild nonspecific white matter changes.   ASSESSMENT & PLAN:  40 year old African-American female, premenopausal, presented with a palpable right breast mass.  1. 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 Her initial biopsy showed 1 triple positive disease, the other was ER/PR positive and HER2 Negative disease. -Given her stage III disease, I recommend staging CT chest, abdomen and pelvis with contrast, and bone scan to ruled out distant metastasis. -We discussed that HER-2 positive breast cancer are more aggressive, with a high risk for recurrence after surgery, especially in the setting of locally advanced stage. I recommend her to have adjuvant chemotherapy TCHP (docetaxel, carboplatin, Herceptin, pejeta) every 3 weeks for total of 6 cycles, followed by Herceptin maintenance therapy to complete 1 year treatment. -Her baseline echo was normal, which was done in April 2017, she will follow-up with cardiologist to monitor her heart during her and her to therapy -I discussed her staging CT and bone scan results, showed no definitive evidence of metastasis. This mild uptake in the right lateral skull on bone scan, and I got a CT head for further evaluation, which showed a small area of heterogeneous bone mineralization, corresponding to the bone scan findings. This is likely physiological. -She had severe back pain and fatigue on day 6 after chemo, probably related to her Neulasta injection, I'll hold it for now. -She developed a prolonged nausea after  last cycle chemotherapy, responded well to dexamethasone. I instructed her to use dexamethasone 4 mg daily for 3-5 days after chemotherapy, starting day 3 -Lab reviewed, adequate for treatment, will proceed to cycle 4 chemotherapy today. -Due to the large size of tumor and node positive disease, she would need postmastectomy chest wall and axilla radiation. She would like to see radiation oncologist earlier to discuss her optimal time for breast reconstruction surgery. I'll refer her  2. Iron deficient anemia, and chemo induced anemia  -Her lab tests showed a moderate anemia with hemoglobin 7.8, low MCV 62.7, her serum iron level and transferrin saturation on low, ferritin 25, this is consistent with iron deficient anemia, likely secondary to her menorrhagia. -Her anemia has resolved after IV Feraheme, slightly worse after she started chemotherapy. -I encouraged her to continue oral iron pill -I repeated her level today, results still pending -Hemoglobin 9.0 now, she is not symptomatic, we'll continue monitoring. -Consider blood transfusion if hemoglobin less than 8, or symptomatic anemia  3. Genetic -Given her young age and positive family history, we strongly recommend her to [facility] genetic counseling for genetic testing to ruled out inheritable breast cancer syndrome. She agrees. -Her genetic testing was normal  4.  HTN -She was noticed to have significant elevated blood pressure lately  -She has seen her primary care physician, and her blood pressure medication has been adjusted, including HCTZ -We discussed the impact of chemotherapy on her blood pressure. If she has diarrhea, or poor by mouth intake, she may need to hold her HCTZ  6. Skin rash -Secondary to Herceptin in the pejeta, resolved now  -use 2.5% hydrocortisone and 1% clindamycin gel as needed for rash  Plan -Lab results reviewed with her, we'll proceed  to cycle 4 chemotherapy today -Right on peripheral -Return to clinic in 3  weeks for next cycle chemotherapy  All questions were answered. The patient and her family members agree with the above plan. The patient knows to call the clinic with any problems, questions or concerns.  I spent 20 minutes counseling the patient face to face. The total time spent in the appointment was 25 minutes and more than 50% was on counseling.     Melanie Merle, MD 01/03/2016 9:20 AM  Dictation #1 [id number]  [id number]

## 2016-01-03 NOTE — Patient Instructions (Addendum)
Trastuzumab injection for infusion What is this medicine? TRASTUZUMAB (tras TOO zoo mab) is a monoclonal antibody. It is used to treat breast cancer and stomach cancer. This medicine may be used for other purposes; ask your health care provider or pharmacist if you have questions. What should I tell my health care provider before I take this medicine? They need to know if you have any of these conditions: -heart disease -heart failure -infection (especially a virus infection such as chickenpox, cold sores, or herpes) -lung or breathing disease, like asthma -recent or ongoing radiation therapy -an unusual or allergic reaction to trastuzumab, benzyl alcohol, or other medications, foods, dyes, or preservatives -pregnant or trying to get pregnant -breast-feeding How should I use this medicine? This drug is given as an infusion into a vein. It is administered in a hospital or clinic by a specially trained health care professional. Talk to your pediatrician regarding the use of this medicine in children. This medicine is not approved for use in children. Overdosage: If you think you have taken too much of this medicine contact a poison control center or emergency room at once. NOTE: This medicine is only for you. Do not share this medicine with others. What if I miss a dose? It is important not to miss a dose. Call your doctor or health care professional if you are unable to keep an appointment. What may interact with this medicine? -doxorubicin -warfarin This list may not describe all possible interactions. Give your health care provider a list of all the medicines, herbs, non-prescription drugs, or dietary supplements you use. Also tell them if you smoke, drink alcohol, or use illegal drugs. Some items may interact with your medicine. What should I watch for while using this medicine? Visit your doctor for checks on your progress. Report any side effects. Continue your course of treatment even  though you feel ill unless your doctor tells you to stop. Call your doctor or health care professional for advice if you get a fever, chills or sore throat, or other symptoms of a cold or flu. Do not treat yourself. Try to avoid being around people who are sick. You may experience fever, chills and shaking during your first infusion. These effects are usually mild and can be treated with other medicines. Report any side effects during the infusion to your health care professional. Fever and chills usually do not happen with later infusions. Do not become pregnant while taking this medicine or for 7 months after stopping it. Women should inform their doctor if they wish to become pregnant or think they might be pregnant. Women of child-bearing potential will need to have a negative pregnancy test before starting this medicine. There is a potential for serious side effects to an unborn child. Talk to your health care professional or pharmacist for more information. Do not breast-feed an infant while taking this medicine or for 7 months after stopping it. Women must use effective birth control with this medicine. What side effects may I notice from receiving this medicine? Side effects that you should report to your doctor or other health care professional as soon as possible: -breathing difficulties -chest pain or palpitations -cough -dizziness or fainting -fever or chills, sore throat -skin rash, itching or hives -swelling of the legs or ankles -unusually weak or tired Side effects that usually do not require medical attention (report to your doctor or other health care professional if they continue or are bothersome): -loss of appetite -headache -muscle aches -nausea This   list may not describe all possible side effects. Call your doctor for medical advice about side effects. You may report side effects to FDA at 1-800-FDA-1088. Where should I keep my medicine? This drug is given in a hospital  or clinic and will not be stored at home. NOTE: This sheet is a summary. It may not cover all possible information. If you have questions about this medicine, talk to your doctor, pharmacist, or health care provider.    2016, [employer]. (2014-07-16 11:49:32)  Carboplatin injection What is this medicine? CARBOPLATIN (KAR boe pla tin) is a chemotherapy drug. It targets fast dividing cells, like cancer cells, and causes these cells to die. This medicine is used to treat ovarian cancer and many other cancers. This medicine may be used for other purposes; ask your health care provider or pharmacist if you have questions. What should I tell my health care provider before I take this medicine? They need to know if you have any of these conditions: -blood disorders -hearing problems -kidney disease -recent or ongoing radiation therapy -an unusual or allergic reaction to carboplatin, cisplatin, other chemotherapy, other medicines, foods, dyes, or preservatives -pregnant or trying to get pregnant -breast-feeding How should I use this medicine? This drug is usually given as an infusion into a vein. It is administered in a hospital or clinic by a specially trained health care professional. Talk to your pediatrician regarding the use of this medicine in children. Special care may be needed. Overdosage: If you think you have taken too much of this medicine contact a poison control center or emergency room at once. NOTE: This medicine is only for you. Do not share this medicine with others. What if I miss a dose? It is important not to miss a dose. Call your doctor or health care professional if you are unable to keep an appointment. What may interact with this medicine? -medicines for seizures -medicines to increase blood counts like filgrastim, pegfilgrastim, sargramostim -some antibiotics like amikacin, gentamicin, neomycin, streptomycin, tobramycin -vaccines Talk to your doctor or  health care professional before taking any of these medicines: -acetaminophen -aspirin -ibuprofen -ketoprofen -naproxen This list may not describe all possible interactions. Give your health care provider a list of all the medicines, herbs, non-prescription drugs, or dietary supplements you use. Also tell them if you smoke, drink alcohol, or use illegal drugs. Some items may interact with your medicine. What should I watch for while using this medicine? Your condition will be monitored carefully while you are receiving this medicine. You will need important blood work done while you are taking this medicine. This drug may make you feel generally unwell. This is not uncommon, as chemotherapy can affect healthy cells as well as cancer cells. Report any side effects. Continue your course of treatment even though you feel ill unless your doctor tells you to stop. In some cases, you may be given additional medicines to help with side effects. Follow all directions for their use. Call your doctor or health care professional for advice if you get a fever, chills or sore throat, or other symptoms of a cold or flu. Do not treat yourself. This drug decreases your body's ability to fight infections. Try to avoid being around people who are sick. This medicine may increase your risk to bruise or bleed. Call your doctor or health care professional if you notice any unusual bleeding. Be careful brushing and flossing your teeth or using a toothpick because you may get an infection or bleed  more easily. If you have any dental work done, tell your dentist you are receiving this medicine. Avoid taking products that contain aspirin, acetaminophen, ibuprofen, naproxen, or ketoprofen unless instructed by your doctor. These medicines may hide a fever. Do not become pregnant while taking this medicine. Women should inform their doctor if they wish to become pregnant or think they might be pregnant. There is a potential for  serious side effects to an unborn child. Talk to your health care professional or pharmacist for more information. Do not breast-feed an infant while taking this medicine. What side effects may I notice from receiving this medicine? Side effects that you should report to your doctor or health care professional as soon as possible: -allergic reactions like skin rash, itching or hives, swelling of the face, lips, or tongue -signs of infection - fever or chills, cough, sore throat, pain or difficulty passing urine -signs of decreased platelets or bleeding - bruising, pinpoint red spots on the skin, black, tarry stools, nosebleeds -signs of decreased red blood cells - unusually weak or tired, fainting spells, lightheadedness -breathing problems -changes in hearing -changes in vision -chest pain -high blood pressure -low blood counts - This drug may decrease the number of white blood cells, red blood cells and platelets. You may be at increased risk for infections and bleeding. -nausea and vomiting -pain, swelling, redness or irritation at the injection site -pain, tingling, numbness in the hands or feet -problems with balance, talking, walking -trouble passing urine or change in the amount of urine Side effects that usually do not require medical attention (report to your doctor or health care professional if they continue or are bothersome): -hair loss -loss of appetite -metallic taste in the mouth or changes in taste This list may not describe all possible side effects. Call your doctor for medical advice about side effects. You may report side effects to FDA at 1-800-FDA-1088. Where should I keep my medicine? This drug is given in a hospital or clinic and will not be stored at home. NOTE: This sheet is a summary. It may not cover all possible information. If you have questions about this medicine, talk to your doctor, pharmacist, or health care provider.    2016, [employer].  (2007-07-15 14:38:05) Docetaxel injection What is this medicine? DOCETAXEL (doe se TAX el) is a chemotherapy drug. It targets fast dividing cells, like cancer cells, and causes these cells to die. This medicine is used to treat many types of cancers like breast cancer, certain stomach cancers, head and neck cancer, lung cancer, and prostate cancer. This medicine may be used for other purposes; ask your health care provider or pharmacist if you have questions. What should I tell my health care provider before I take this medicine? They need to know if you have any of these conditions: -infection (especially a virus infection such as chickenpox, cold sores, or herpes) -liver disease -low blood counts, like low white cell, platelet, or red cell counts -an unusual or allergic reaction to docetaxel, polysorbate 80, other chemotherapy agents, other medicines, foods, dyes, or preservatives -pregnant or trying to get pregnant -breast-feeding How should I use this medicine? This drug is given as an infusion into a vein. It is administered in a hospital or clinic by a specially trained health care professional. Talk to your pediatrician regarding the use of this medicine in children. Special care may be needed. Overdosage: If you think you have taken too much of this medicine contact a poison control center  or emergency room at once. NOTE: This medicine is only for you. Do not share this medicine with others. What if I miss a dose? It is important not to miss your dose. Call your doctor or health care professional if you are unable to keep an appointment. What may interact with this medicine? -cyclosporine -erythromycin -ketoconazole -medicines to increase blood counts like filgrastim, pegfilgrastim, sargramostim -vaccines Talk to your doctor or health care professional before taking any of these medicines: -acetaminophen -aspirin -ibuprofen -ketoprofen -naproxen This list may not describe all  possible interactions. Give your health care provider a list of all the medicines, herbs, non-prescription drugs, or dietary supplements you use. Also tell them if you smoke, drink alcohol, or use illegal drugs. Some items may interact with your medicine. What should I watch for while using this medicine? Your condition will be monitored carefully while you are receiving this medicine. You will need important blood work done while you are taking this medicine. This drug may make you feel generally unwell. This is not uncommon, as chemotherapy can affect healthy cells as well as cancer cells. Report any side effects. Continue your course of treatment even though you feel ill unless your doctor tells you to stop. In some cases, you may be given additional medicines to help with side effects. Follow all directions for their use. Call your doctor or health care professional for advice if you get a fever, chills or sore throat, or other symptoms of a cold or flu. Do not treat yourself. This drug decreases your body's ability to fight infections. Try to avoid being around people who are sick. This medicine may increase your risk to bruise or bleed. Call your doctor or health care professional if you notice any unusual bleeding. This medicine may contain alcohol in the product. You may get drowsy or dizzy. Do not drive, use machinery, or do anything that needs mental alertness until you know how this medicine affects you. Do not stand or sit up quickly, especially if you are an older patient. This reduces the risk of dizzy or fainting spells. Avoid alcoholic drinks. Do not become pregnant while taking this medicine. Women should inform their doctor if they wish to become pregnant or think they might be pregnant. There is a potential for serious side effects to an unborn child. Talk to your health care professional or pharmacist for more information. Do not breast-feed an infant while taking this medicine. What  side effects may I notice from receiving this medicine? Side effects that you should report to your doctor or health care professional as soon as possible: -allergic reactions like skin rash, itching or hives, swelling of the face, lips, or tongue -low blood counts - This drug may decrease the number of white blood cells, red blood cells and platelets. You may be at increased risk for infections and bleeding. -signs of infection - fever or chills, cough, sore throat, pain or difficulty passing urine -signs of decreased platelets or bleeding - bruising, pinpoint red spots on the skin, black, tarry stools, nosebleeds -signs of decreased red blood cells - unusually weak or tired, fainting spells, lightheadedness -breathing problems -fast or irregular heartbeat -low blood pressure -mouth sores -nausea and vomiting -pain, swelling, redness or irritation at the injection site -pain, tingling, numbness in the hands or feet -swelling of the ankle, feet, hands -weight gain Side effects that usually do not require medical attention (report to your prescriber or health care professional if they continue or are bothersome): -  bone pain -complete hair loss including hair on your head, underarms, pubic hair, eyebrows, and eyelashes -diarrhea -excessive tearing -changes in the color of fingernails -loosening of the fingernails -nausea -muscle pain -red flush to skin -sweating -weak or tired This list may not describe all possible side effects. Call your doctor for medical advice about side effects. You may report side effects to FDA at 1-800-FDA-1088. Where should I keep my medicine? This drug is given in a hospital or clinic and will not be stored at home. NOTE: This sheet is a summary. It may not cover all possible information. If you have questions about this medicine, talk to your doctor, pharmacist, or health care provider.    2016, [employer] Standard. (2014-04-26 16:04:57) Pertuzumab  injection What is this medicine? PERTUZUMAB (per TOOZ ue mab) is a monoclonal antibody. It is used to treat breast cancer. This medicine may be used for other purposes; ask your health care provider or pharmacist if you have questions. What should I tell my health care provider before I take this medicine? They need to know if you have any of these conditions: -heart disease -heart failure -high blood pressure -history of irregular heart beat -recent or ongoing radiation therapy -an unusual or allergic reaction to pertuzumab, other medicines, foods, dyes, or preservatives -pregnant or trying to get pregnant -breast-feeding How should I use this medicine? This medicine is for infusion into a vein. It is given by a health care professional in a hospital or clinic setting. Talk to your pediatrician regarding the use of this medicine in children. Special care may be needed. Overdosage: If you think you have taken too much of this medicine contact a poison control center or emergency room at once. NOTE: This medicine is only for you. Do not share this medicine with others. What if I miss a dose? It is important not to miss your dose. Call your doctor or health care professional if you are unable to keep an appointment. What may interact with this medicine? Interactions are not expected. Give your health care provider a list of all the medicines, herbs, non-prescription drugs, or dietary supplements you use. Also tell them if you smoke, drink alcohol, or use illegal drugs. Some items may interact with your medicine. This list may not describe all possible interactions. Give your health care provider a list of all the medicines, herbs, non-prescription drugs, or dietary supplements you use. Also tell them if you smoke, drink alcohol, or use illegal drugs. Some items may interact with your medicine. What should I watch for while using this medicine? Your condition will be monitored carefully while  you are receiving this medicine. Report any side effects. Continue your course of treatment even though you feel ill unless your doctor tells you to stop. Do not become pregnant while taking this medicine or for 7 months after stopping it. Women should inform their doctor if they wish to become pregnant or think they might be pregnant. Women of child-bearing potential will need to have a negative pregnancy test before starting this medicine. There is a potential for serious side effects to an unborn child. Talk to your health care professional or pharmacist for more information. Do not breast-feed an infant while taking this medicine or for 7 months after stopping it. Women must use effective birth control with this medicine. Call your doctor or health care professional for advice if you get a fever, chills or sore throat, or other symptoms of a cold or flu. Do not  treat yourself. Try to avoid being around people who are sick. You may experience fever, chills, and headache during the infusion. Report any side effects during the infusion to your health care professional. What side effects may I notice from receiving this medicine? Side effects that you should report to your doctor or health care professional as soon as possible: -breathing problems -chest pain or palpitations -dizziness -feeling faint or lightheaded -fever or chills -skin rash, itching or hives -sore throat -swelling of the face, lips, or tongue -swelling of the legs or ankles -unusually weak or tired Side effects that usually do not require medical attention (Report these to your doctor or health care professional if they continue or are bothersome.): -diarrhea -hair loss -nausea, vomiting -tiredness This list may not describe all possible side effects. Call your doctor for medical advice about side effects. You may report side effects to FDA at 1-800-FDA-1088. Where should I keep my medicine? This drug is given in a hospital  or clinic and will not be stored at home. NOTE: This sheet is a summary. It may not cover all possible information. If you have questions about this medicine, talk to your doctor, pharmacist, or health care provider.    2016, [employer]. (2014-07-16 16:07:57) Dexamethasone injection What is this medicine? DEXAMETHASONE (dex a METH a sone) is a corticosteroid. It is used to treat inflammation of the skin, joints, lungs, and other organs. Common conditions treated include asthma, allergies, and arthritis. It is also used for other conditions, like blood disorders and diseases of the adrenal glands. This medicine may be used for other purposes; ask your health care provider or pharmacist if you have questions. What should I tell my health care provider before I take this medicine? They need to know if you have any of these conditions: -blood clotting problems -Cushing's syndrome -diabetes -glaucoma -heart problems or disease -high blood pressure -infection like herpes, measles, tuberculosis, or chickenpox -kidney disease -liver disease -mental problems -myasthenia gravis -osteoporosis -previous heart attack -seizures -stomach, ulcer or intestine disease including colitis and diverticulitis -thyroid problem -an unusual or allergic reaction to dexamethasone, corticosteroids, other medicines, lactose, foods, dyes, or preservatives -pregnant or trying to get pregnant -breast-feeding How should I use this medicine? This medicine is for injection into a muscle, joint, lesion, soft tissue, or vein. It is given by a health care professional in a hospital or clinic setting. Talk to your pediatrician regarding the use of this medicine in children. Special care may be needed. Overdosage: If you think you have taken too much of this medicine contact a poison control center or emergency room at once. NOTE: This medicine is only for you. Do not share this medicine with others. What if I  miss a dose? This may not apply. If you are having a series of injections over a prolonged period, try not to miss an appointment. Call your doctor or health care professional to reschedule if you are unable to keep an appointment. What may interact with this medicine? Do not take this medicine with any of the following medications: -mifepristone, RU-486 -vaccines This medicine may also interact with the following medications: -amphotericin B -antibiotics like clarithromycin, erythromycin, and troleandomycin -aspirin and aspirin-like drugs -barbiturates like phenobarbital -carbamazepine -cholestyramine -cholinesterase inhibitors like donepezil, galantamine, rivastigmine, and tacrine -cyclosporine -digoxin -diuretics -ephedrine -female hormones, like estrogens or progestins and birth control pills -indinavir -isoniazid -ketoconazole -medicines for diabetes -medicines that improve muscle tone or strength for conditions like myasthenia gravis -NSAIDs, medicines for  pain and inflammation, like ibuprofen or naproxen -phenytoin -rifampin -thalidomide -warfarin This list may not describe all possible interactions. Give your health care provider a list of all the medicines, herbs, non-prescription drugs, or dietary supplements you use. Also tell them if you smoke, drink alcohol, or use illegal drugs. Some items may interact with your medicine. What should I watch for while using this medicine? Your condition will be monitored carefully while you are receiving this medicine. If you are taking this medicine for a long time, carry an identification card with your name and address, the type and dose of your medicine, and your doctor's name and address. This medicine may increase your risk of getting an infection. Stay away from people who are sick. Tell your doctor or health care professional if you are around anyone with measles or chickenpox. Talk to your health care provider before you get  any vaccines that you take this medicine. If you are going to have surgery, tell your doctor or health care professional that you have taken this medicine within the last twelve months. Ask your doctor or health care professional about your diet. You may need to lower the amount of salt you eat. The medicine can increase your blood sugar. If you are a diabetic check with your doctor if you need help adjusting the dose of your diabetic medicine. What side effects may I notice from receiving this medicine? Side effects that you should report to your doctor or health care professional as soon as possible: -allergic reactions like skin rash, itching or hives, swelling of the face, lips, or tongue -black or tarry stools -change in the amount of urine -changes in vision -confusion, excitement, restlessness, a false sense of well-being -fever, sore throat, sneezing, cough, or other signs of infection, wounds that will not heal -hallucinations -increased thirst -mental depression, mood swings, mistaken feelings of self importance or of being mistreated -pain in hips, back, ribs, arms, shoulders, or legs -pain, redness, or irritation at the injection site -redness, blistering, peeling or loosening of the skin, including inside the mouth -rounding out of face -swelling of feet or lower legs -unusual bleeding or bruising -unusual tired or weak -wounds that do not heal Side effects that usually do not require medical attention (report to your doctor or health care professional if they continue or are bothersome): -diarrhea or constipation -change in taste -headache -nausea, vomiting -skin problems, acne, thin and shiny skin -touble sleeping -unusual growth of hair on the face or body -weight gain This list may not describe all possible side effects. Call your doctor for medical advice about side effects. You may report side effects to FDA at 1-800-FDA-1088. Where should I keep my medicine? This  drug is given in a hospital or clinic and will not be stored at home. NOTE: This sheet is a summary. It may not cover all possible information. If you have questions about this medicine, talk to your doctor, pharmacist, or health care provider.    2016, [employer]. (2007-07-31 14:04:12) Palonosetron Injection What is this medicine? PALONOSETRON (pal oh NOE se tron) is used to prevent nausea and vomiting caused by chemotherapy. It also helps prevent delayed nausea and vomiting that may occur a few days after your treatment. This medicine may be used for other purposes; ask your health care provider or pharmacist if you have questions. What should I tell my health care provider before I take this medicine? They need to know if you have any of these conditions: -  an unusual or allergic reaction to palonosetron, dolasetron, granisetron, ondansetron, other medicines, foods, dyes, or preservatives -pregnant or trying to get pregnant -breast-feeding How should I use this medicine? This medicine is for infusion into a vein. It is given by a health care professional in a hospital or clinic setting. Talk to your pediatrician regarding the use of this medicine in children. While this drug may be prescribed for children as young as 1 month for selected conditions, precautions do apply. Overdosage: If you think you have taken too much of this medicine contact a poison control center or emergency room at once. NOTE: This medicine is only for you. Do not share this medicine with others. What if I miss a dose? This does not apply. What may interact with this medicine? -certain medicines for depression, anxiety, or psychotic disturbances -fentanyl -linezolid -MAOIs like Carbex, Eldepryl, Marplan, Nardil, and Parnate -methylene blue (injected into a vein) -tramadol This list may not describe all possible interactions. Give your health care provider a list of all the medicines, herbs,  non-prescription drugs, or dietary supplements you use. Also tell them if you smoke, drink alcohol, or use illegal drugs. Some items may interact with your medicine. What should I watch for while using this medicine? Your condition will be monitored carefully while you are receiving this medicine. What side effects may I notice from receiving this medicine? Side effects that you should report to your doctor or health care professional as soon as possible: -allergic reactions like skin rash, itching or hives, swelling of the face, lips, or tongue -breathing problems -confusion -dizziness -fast, irregular heartbeat -fever and chills -loss of balance or coordination -seizures -sweating -swelling of the hands and feet -tremors -unusually weak or tired Side effects that usually do not require medical attention (report to your doctor or health care professional if they continue or are bothersome): -constipation or diarrhea -headache This list may not describe all possible side effects. Call your doctor for medical advice about side effects. You may report side effects to FDA at 1-800-FDA-1088. Where should I keep my medicine? This drug is given in a hospital or clinic and will not be stored at home. NOTE: This sheet is a summary. It may not cover all possible information. If you have questions about this medicine, talk to your doctor, pharmacist, or health care provider.    2016, [employer] Standard. (2013-02-13 10:38:36)

## 2016-01-23 NOTE — Progress Notes (Signed)
Location of Breast Cancer: Breast cancer of lower-outer quadrant of right female breast   Histology per Pathology Report:   09/29/15 Diagnosis 1. Lymph node, sentinel, biopsy, Right axillary #1 - ONE BENIGN LYMPH NODE WITH NO TUMOR SEEN (0/1). 2. Lymph node, sentinel, biopsy, Right axillary #2 - ONE LYMPH NODE POSITIVE FOR METASTATIC MAMMARY CARCINOMA (1/1). 3. Lymph node, sentinel, biopsy, Right axillary #4 - ONE BENIGN LYMPH NODE WITH NO TUMOR SEEN (0/1). 4. 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 5. 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 - LARGEST FOCUS OF INVASIVE MAMMARY CARCINOMA IS GRADE 2, SHOWS ASSOCIATED LOBULAR CARCINOMA IN SITU AND MEASURES 13 CM IN GREATEST DIMENSION. - SECOND LARGEST FOCUS SHOWS INVASIVE GRADE 3 MAMMARY CARCINOMA AND SPANS 2.1 CM IN GREATEST DIMENSION. - THIRD LARGEST FOCUS SHOWS INVASIVE GRADE 2 MAMMARY CARCINOMA WITH LOBULAR CARCINOMA IN SITU AND MEASURES 1.9 CM IN GREATEST DIMENSION. - SMALLEST FOCUS SHOWS INVASIVE GRADE 3 MAMMARY CARCINOMA AND MEASURES 0.5 CM IN GREATEST DIMENSION. - ANTERIOR INFERIOR SOFT TISSUE MARGIN DEMONSTRATES BROAD POSITIVITY FOR INVASIVE MAMMARY CARCINOMA. - OTHER MARGINS ARE NEGATIVE. - SEE ONCOLOGY TEMPLATE. 6. Lymph node, sentinel, biopsy, Right axillary #3 - ONE BENIGN LYMPH NODE WITH NO TUMOR SEEN (0/1). 7. Lymph node, sentinel, biopsy, Right axillary #6 - ONE LYMPH NODE WITH ISOLATED TUMOR CELLS. 8. Lymph nodes, regional resection, Right axillary contents - ELEVEN BENIGN LYMPH NODES WITH NO TUMOR SEEN (0/11).  08/11/15 Diagnosis Lymph node, needle/core biopsy, right axilla ONE BENIGN LYMPH NODE (0/1)  07/26/15  Diagnosis 1. Breast, right, needle core biopsy, 8:00 o'clock, 12 CMFN - INVASIVE DUCTAL CARCINOMA. - SEE COMMENT. 2. Breast, right, needle  core biopsy, 6:30 o'clock,1 CMFN - INVASIVE AND IN SITU MAMMARY CARCINOMA. - LYMPHOVASCULAR INVASION IS IDENTIFIED. - SEE COMMENT  Receptor Status: biopsy 1: ER(95%), PR (95%), Her2-neu (positive), Ki-(30%), biopsy 2: ER(90%), PR (95%), Her2-neu (negative), Ki-(10%)  Did patient present with symptoms (if so, please note symptoms) or was this found on screening mammography?: She felt a breast lump and noticed mild intermittent breast pain 2 weeks ago   Past/Anticipated interventions by surgeon, if any: 09/29/15 - Procedure: RIGHT TOTAL  MASTECTOMY WITH AXILLARY SENTINEL NODE BIOPSY;  Surgeon: Excell Seltzer, MD and Procedure: BREAST RECONSTRUCTION WITH PLACEMENT OF TISSUE EXPANDER AND FLEX HD (ACELLULAR HYDRATED DERMIS);  Surgeon: Loel Lofty Dillingham, DO  Past/Anticipated interventions by medical oncology, if any:  adjuvant chemotherapy TCHP (docetaxel, carboplatin, Herceptin, pejeta) every 3 weeks for total of 6 cycles, followed by Herceptin maintenance therapy to complete 1 year treatment.  Last chemotherapy will be on 02/14/16.  Lymphedema issues, if any:  yes right - reports having more swelling in her lower right arm on Sunday.  She does wear a sleave.  Pain issues, if any:  no   Gyn History: Menarchal: 10, Contraceptive: no, HRT: n/a, G4P1: 3 miscarrage, one 88 yo son, no plan to have more children   SAFETY ISSUES:  Prior radiation? no  Pacemaker/ICD? no  Possible current pregnancy?no  Is the patient on methotrexate? no  Current Complaints / other details:  Patient's bp was elevated at 160/106 and 156/102 when retaken.  She said she did not take diovan yesterday.  BP (!) 160/106 (BP Location: Left Arm, Patient Position: Sitting)   Pulse 86   Temp 97.7 F (36.5 C) (Oral)   Ht '5\' 3"'$  (1.6 m)   Wt  153 lb 6.4 oz (69.6 kg)   LMP 01/18/2016   SpO2 100%   BMI 27.17 kg/m    Wt Readings from Last 3 Encounters:  01/25/16 153 lb 6.4 oz (69.6 kg)  01/24/16 151 lb 6.4 oz (68.7 kg)   01/03/16 150 lb 11.2 oz (68.4 kg)      Hess, Craige Cotta, RN 01/23/2016,10:42 AM

## 2016-01-24 ENCOUNTER — Ambulatory Visit (HOSPITAL_BASED_OUTPATIENT_CLINIC_OR_DEPARTMENT_OTHER): Payer: BLUE CROSS/BLUE SHIELD | Admitting: Hematology

## 2016-01-24 ENCOUNTER — Telehealth: Payer: Self-pay | Admitting: Hematology

## 2016-01-24 ENCOUNTER — Ambulatory Visit: Payer: BLUE CROSS/BLUE SHIELD

## 2016-01-24 ENCOUNTER — Ambulatory Visit (HOSPITAL_BASED_OUTPATIENT_CLINIC_OR_DEPARTMENT_OTHER): Payer: BLUE CROSS/BLUE SHIELD

## 2016-01-24 ENCOUNTER — Encounter: Payer: Self-pay | Admitting: Hematology

## 2016-01-24 ENCOUNTER — Other Ambulatory Visit (HOSPITAL_BASED_OUTPATIENT_CLINIC_OR_DEPARTMENT_OTHER): Payer: BLUE CROSS/BLUE SHIELD

## 2016-01-24 VITALS — BP 141/94 | HR 88 | Temp 98.3°F | Resp 17 | Ht 63.0 in | Wt 151.4 lb

## 2016-01-24 DIAGNOSIS — D509 Iron deficiency anemia, unspecified: Secondary | ICD-10-CM | POA: Diagnosis not present

## 2016-01-24 DIAGNOSIS — C773 Secondary and unspecified malignant neoplasm of axilla and upper limb lymph nodes: Secondary | ICD-10-CM

## 2016-01-24 DIAGNOSIS — C50511 Malignant neoplasm of lower-outer quadrant of right female breast: Secondary | ICD-10-CM

## 2016-01-24 DIAGNOSIS — Z5112 Encounter for antineoplastic immunotherapy: Secondary | ICD-10-CM | POA: Diagnosis not present

## 2016-01-24 DIAGNOSIS — R110 Nausea: Secondary | ICD-10-CM

## 2016-01-24 DIAGNOSIS — Z170 Estrogen receptor positive status [ER+]: Principal | ICD-10-CM

## 2016-01-24 DIAGNOSIS — R197 Diarrhea, unspecified: Secondary | ICD-10-CM

## 2016-01-24 DIAGNOSIS — D500 Iron deficiency anemia secondary to blood loss (chronic): Secondary | ICD-10-CM

## 2016-01-24 DIAGNOSIS — I10 Essential (primary) hypertension: Secondary | ICD-10-CM

## 2016-01-24 DIAGNOSIS — Z5111 Encounter for antineoplastic chemotherapy: Secondary | ICD-10-CM | POA: Diagnosis not present

## 2016-01-24 DIAGNOSIS — D6481 Anemia due to antineoplastic chemotherapy: Secondary | ICD-10-CM

## 2016-01-24 LAB — COMPREHENSIVE METABOLIC PANEL
ALT: 18 U/L (ref 0–55)
AST: 23 U/L (ref 5–34)
Albumin: 3.4 g/dL — ABNORMAL LOW (ref 3.5–5.0)
Alkaline Phosphatase: 58 U/L (ref 40–150)
Anion Gap: 10 mEq/L (ref 3–11)
BUN: 13.3 mg/dL (ref 7.0–26.0)
CO2: 25 mEq/L (ref 22–29)
Calcium: 9.5 mg/dL (ref 8.4–10.4)
Chloride: 106 mEq/L (ref 98–109)
Creatinine: 1 mg/dL (ref 0.6–1.1)
EGFR: 83 mL/min/{1.73_m2} — ABNORMAL LOW (ref >90)
Glucose: 113 mg/dl (ref 70–140)
Potassium: 3.4 mEq/L — ABNORMAL LOW (ref 3.5–5.1)
Sodium: 141 mEq/L (ref 136–145)
Total Bilirubin: <0.3 mg/dL (ref 0.20–1.20)
Total Protein: 7.1 g/dL (ref 6.4–8.3)

## 2016-01-24 LAB — CBC WITH DIFFERENTIAL/PLATELET
BASO%: 0 % (ref 0.0–2.0)
Basophils Absolute: 0 10*3/uL (ref 0.0–0.1)
EOS%: 0 % (ref 0.0–7.0)
Eosinophils Absolute: 0 10*3/uL (ref 0.0–0.5)
HCT: 26.4 % — ABNORMAL LOW (ref 34.8–46.6)
HGB: 8.8 g/dL — ABNORMAL LOW (ref 11.6–15.9)
LYMPH%: 31.9 % (ref 14.0–49.7)
MCH: 30.1 pg (ref 25.1–34.0)
MCHC: 33.3 g/dL (ref 31.5–36.0)
MCV: 90.4 fL (ref 79.5–101.0)
MONO#: 0.3 10*3/uL (ref 0.1–0.9)
MONO%: 5.5 % (ref 0.0–14.0)
NEUT#: 3.8 10*3/uL (ref 1.5–6.5)
NEUT%: 62.6 % (ref 38.4–76.8)
Platelets: 126 10*3/uL — ABNORMAL LOW (ref 145–400)
RBC: 2.92 10*6/uL — ABNORMAL LOW (ref 3.70–5.45)
RDW: 15 % — ABNORMAL HIGH (ref 11.2–14.5)
WBC: 6 10*3/uL (ref 3.9–10.3)
lymph#: 1.9 10*3/uL (ref 0.9–3.3)

## 2016-01-24 MED ORDER — ACETAMINOPHEN 325 MG PO TABS
650.0000 mg | ORAL_TABLET | Freq: Once | ORAL | Status: AC
  Administered 2016-01-24: 650 mg via ORAL

## 2016-01-24 MED ORDER — HEPARIN SOD (PORK) LOCK FLUSH 100 UNIT/ML IV SOLN
500.0000 [IU] | Freq: Once | INTRAVENOUS | Status: AC | PRN
  Administered 2016-01-24: 500 [IU]
  Filled 2016-01-24: qty 5

## 2016-01-24 MED ORDER — PALONOSETRON HCL INJECTION 0.25 MG/5ML
0.2500 mg | Freq: Once | INTRAVENOUS | Status: AC
  Administered 2016-01-24: 0.25 mg via INTRAVENOUS

## 2016-01-24 MED ORDER — PERTUZUMAB CHEMO INJECTION 420 MG/14ML
420.0000 mg | Freq: Once | INTRAVENOUS | Status: AC
  Administered 2016-01-24: 420 mg via INTRAVENOUS
  Filled 2016-01-24: qty 14

## 2016-01-24 MED ORDER — SODIUM CHLORIDE 0.9 % IV SOLN
75.0000 mg/m2 | Freq: Once | INTRAVENOUS | Status: AC
  Administered 2016-01-24: 130 mg via INTRAVENOUS
  Filled 2016-01-24: qty 8

## 2016-01-24 MED ORDER — DIPHENHYDRAMINE HCL 25 MG PO CAPS
50.0000 mg | ORAL_CAPSULE | Freq: Once | ORAL | Status: AC
  Administered 2016-01-24: 50 mg via ORAL

## 2016-01-24 MED ORDER — SODIUM CHLORIDE 0.9% FLUSH
10.0000 mL | INTRAVENOUS | Status: DC | PRN
  Administered 2016-01-24: 10 mL
  Filled 2016-01-24: qty 10

## 2016-01-24 MED ORDER — TRASTUZUMAB CHEMO INJECTION 440 MG
6.0000 mg/kg | Freq: Once | INTRAVENOUS | Status: AC
  Administered 2016-01-24: 399 mg via INTRAVENOUS
  Filled 2016-01-24: qty 19

## 2016-01-24 MED ORDER — SODIUM CHLORIDE 0.9 % IV SOLN
10.0000 mg | Freq: Once | INTRAVENOUS | Status: AC
  Administered 2016-01-24: 10 mg via INTRAVENOUS
  Filled 2016-01-24: qty 1

## 2016-01-24 MED ORDER — DIPHENHYDRAMINE HCL 25 MG PO CAPS
ORAL_CAPSULE | ORAL | Status: AC
  Filled 2016-01-24: qty 2

## 2016-01-24 MED ORDER — PALONOSETRON HCL INJECTION 0.25 MG/5ML
INTRAVENOUS | Status: AC
  Filled 2016-01-24: qty 5

## 2016-01-24 MED ORDER — CARBOPLATIN CHEMO INJECTION 600 MG/60ML
605.4000 mg | Freq: Once | INTRAVENOUS | Status: AC
  Administered 2016-01-24: 610 mg via INTRAVENOUS
  Filled 2016-01-24: qty 61

## 2016-01-24 MED ORDER — ACETAMINOPHEN 325 MG PO TABS
ORAL_TABLET | ORAL | Status: AC
  Filled 2016-01-24: qty 2

## 2016-01-24 MED ORDER — SODIUM CHLORIDE 0.9 % IJ SOLN
10.0000 mL | INTRAMUSCULAR | Status: DC | PRN
  Administered 2016-01-24: 10 mL via INTRAVENOUS
  Filled 2016-01-24: qty 10

## 2016-01-24 MED ORDER — SODIUM CHLORIDE 0.9 % IV SOLN
Freq: Once | INTRAVENOUS | Status: AC
  Administered 2016-01-24: 12:00:00 via INTRAVENOUS

## 2016-01-24 NOTE — Progress Notes (Signed)
[place]  [phone] [phone]  Clinic Follow Up Note   Patient Care Team: Lucianne Lei, MD as PCP - General (Family Medicine) Excell Seltzer, MD as Consulting Physician (General Surgery) Thea Silversmith, MD as Consulting Physician (Radiation Oncology) 01/24/2016  CHIEF COMPLAINTS:  Follow up right breast cancer  Oncology History   Breast cancer of lower-outer quadrant of right female breast [facility])   Staging form: Breast, AJCC 7th Edition     Clinical stage from 08/03/2015: Stage IA (T1c, N0, M0) - Unsigned     Pathologic stage from 09/29/2015: Stage IIIA (T3(m), N1a, cM0) - Signed by Truitt Merle, MD on 10/17/2015         Breast cancer of lower-outer quadrant of right female breast ([place])   07/25/2015 Mammogram    diagnostic mammogram and ultrasound showed a 11 mm mass in the 8:00 position of the right breast, small irregular lesion in the retroareolar regio of the right breast, contiguous with the dermis      07/26/2015 Initial Diagnosis    Breast cancer of lower-outer quadrant of right female breast ([place])      07/26/2015 Initial Biopsy    right breast needle core biopsy at 8:00 position (1), invasive ductal carcinoma, grade 3, 6:30 o'clock position invasive (2) and in situ ductal carcinoma, grade 1-2, lymphovascular invasion is identified      07/26/2015 Receptors her2    (1) ER 95% positive, PR 95% positive, Ki-67 30%, HER-2 positive with ratio 2.04, copy # 5.20, (2) ER 90% positive, PR 95% positive, Ki-67 10%, HER-2 negative.      08/02/2015 Imaging    breast MRI showed extensive mass  and non-mass enhancement in the lower outer quadrant of the right breast  spanning 13 cm, focal enhancement within the right nipple, at least 3-4 abnormal appearing right axillary lymph node.      09/29/2015 Surgery    right mastectomy and axillar node dissection       09/29/2015 Pathology Results    Right mastectomy and axillary node dissection showed  multifocal mammary carcinoma with ductal and lobular features, tumor size 13 cm, tubal 1 cm, 1.9 cm, 2.5 cm, grade 2-3, anterior margins were positive, 2 nodes with metastasis and one with isolated cells      11/01/2015 -  Chemotherapy          HISTORY OF PRESENTING ILLNESS (08/03/2015):  Melanie Moss 40 y.o. female is here because of Her newly diagnosed right breast cancer. She is accompanied by her fianc, sister and mother to our multidisciplinary breast clinic today.  She felt a breast lump and noticed mild intermittent breast pain 2 weeks ago, no nipple discharge or skin change. She also has moderate fatigue and dyspnea on moderate to heavy exertion for a few months, no weight loss, no other pain or other symptoms.  She had normal mammgram 6 years ago, for a small lump which felt to be benign and no biopsy was done.    She has iregular period, twice a month, it lasts about 5-7 days, heavy for 3 days, she chanes 6-7 times a day. She was found to be anemic recently, and has started taking oral iron pill a few weeks ago, tolerates well.  GYN HISTORY  Menarchal: 10  LMP: 07/29/2015 Contraceptive: no  HRT: n/a  G4P1: 3 miscarrage, one 67 yo son, no plan to have more children   CURRENT THERAPY: Adjuvant chemo TCHP every 3 weeks, started on 11/01/2015  INTERIM HISTORY:  Melanie Moss returns for follow-up and 5th cycle chemotherapy. She is doing well overall. She had mild nausea, diarrhea, low appetite and fatigue for a week after chemotherapy, she was able to eat and drink enough, she has recovered well this week. She will reports right arm swelling lately, she has been using the compression sleeve, but intimate swollen worse. Mild numbness in the right hand, no neuropathy, other extremities. She otherwise feels well, weight is stable. Her menstrual period is still quite heavily, it occurs every 2-3 weeks.  MEDICAL HISTORY:  Past Medical History:  Diagnosis Date  . Anemia   . Anxiety   . Breast  cancer of lower-outer quadrant of right female breast ([place]) 07/29/2015  . Family history of breast cancer   . Family history of colon cancer   . Family history of prostate cancer   . GERD (gastroesophageal reflux disease)    tums   . Gestational diabetes   . Hyperlipidemia   . Hypertension    not currently on medication  . Preterm labor     SURGICAL HISTORY: Past Surgical History:  Procedure Laterality Date  . AXILLARY LYMPH NODE DISSECTION Right 09/29/2015   Procedure: AXILLARY LYMPH NODE DISSECTION;  Surgeon: Excell Seltzer, MD;  Location: [facility];  Service: General;  Laterality: Right;  . BREAST RECONSTRUCTION WITH PLACEMENT OF TISSUE EXPANDER AND FLEX HD (ACELLULAR HYDRATED DERMIS) Right 09/29/2015   Procedure: BREAST RECONSTRUCTION WITH PLACEMENT OF TISSUE EXPANDER AND FLEX HD (ACELLULAR HYDRATED DERMIS);  Surgeon: Loel Lofty Dillingham, DO;  Location: [facility];  Service: Plastics;  Laterality: Right;  . CERVICAL CERCLAGE  02/19/2011   Procedure: CERCLAGE CERVICAL;  Surgeon: Agnes Lawrence, MD;  Location: [facility];  Service: Gynecology;  Laterality: N/A;  . DILATION AND CURETTAGE OF UTERUS    . MASTECTOMY WITH AXILLARY LYMPH NODE DISSECTION Right 09/29/2015  . PORTACATH PLACEMENT N/A 09/29/2015   Procedure: INSERTION PORT-A-CATH;  Surgeon: Excell Seltzer, MD;  Location: [facility];  Service: General;  Laterality: N/A;  . SIMPLE MASTECTOMY WITH AXILLARY SENTINEL NODE BIOPSY Right 09/29/2015   Procedure: RIGHT TOTAL  MASTECTOMY WITH AXILLARY SENTINEL NODE BIOPSY;  Surgeon: Excell Seltzer, MD;  Location: [place];  Service: General;  Laterality: Right;    SOCIAL HISTORY: Social History   Social History  . Marital status: Single    Spouse name: N/A  . Number of children: 1  . Years of education: N/A   Occupational History  . Not on file.   Social History Main Topics  . Smoking status: Never Smoker  . Smokeless tobacco: Never Used  . Alcohol use No  . Drug use: No  . Sexual  activity: Yes   Other Topics Concern  . Not on file   Social History Narrative  . No narrative on file    FAMILY HISTORY: Family History  Problem Relation Age of Onset  . Hypertension Mother   . Hypertension Father   . Prostate cancer Father 22  . Breast cancer Maternal Aunt 22  . Breast cancer Paternal Aunt 26  . Cirrhosis Maternal Uncle   . Prostate cancer Paternal Uncle   . Throat cancer Maternal Aunt     non smoker  . Prostate cancer Paternal Uncle   . Colon cancer Cousin     maternal first cousin dx in his 21s  . Lung cancer Cousin     smoker    ALLERGIES:  has No Known Allergies.  MEDICATIONS:  Current Outpatient Prescriptions  Medication Sig Dispense Refill  .  amLODipine (NORVASC) 5 MG tablet Take 5 mg by mouth at bedtime.  0  . B Complex-C (SUPER B COMPLEX PO) Take 1 tablet by mouth daily.    Marland Kitchen dexamethasone (DECADRON) 4 MG tablet Take one tab in am daily x 3-5 days & stop if nausea better 20 tablet 0  . diphenhydrAMINE (BENADRYL) 12.5 MG/5ML elixir Take 5 mLs (12.5 mg total) by mouth every 6 (six) hours as needed for itching. 120 mL 0  . ferrous sulfate 325 (65 FE) MG tablet Take 325 mg by mouth 2 (two) times daily with a meal.    . ibuprofen (ADVIL,MOTRIN) 200 MG tablet Take 200 mg by mouth every 6 (six) hours as needed.    . lidocaine-prilocaine (EMLA) cream Apply to affected area once 30 g 3  . LORazepam (ATIVAN) 0.5 MG tablet Take 1 tablet (0.5 mg total) by mouth at bedtime as needed for anxiety (nausea). 30 tablet 0  . Multiple Vitamin (MULTIVITAMIN) tablet Take 1 tablet by mouth daily.    . ondansetron (ZOFRAN) 8 MG tablet Take 1 tablet (8 mg total) by mouth 2 (two) times daily as needed for refractory nausea / vomiting. Start on day 3 after chemo. 30 tablet 1  . ondansetron (ZOFRAN-ODT) 4 MG disintegrating tablet Take 4 mg by mouth every 8 (eight) hours as needed for nausea or vomiting. Reported on 11/01/2015  0  . polyethylene glycol (MIRALAX / GLYCOLAX)  packet Take 17 g by mouth daily as needed for mild constipation. 14 each 0  . Potassium Chloride ER 20 MEQ TBCR Take 20 mEq by mouth daily. 30 tablet 1  . prochlorperazine (COMPAZINE) 10 MG tablet Take 1 tablet (10 mg total) by mouth every 6 (six) hours as needed (Nausea or vomiting). 30 tablet 1  . senna (SENOKOT) 8.6 MG TABS tablet Take 1 tablet (8.6 mg total) by mouth 2 (two) times daily. (Patient taking differently: Take 1 tablet by mouth daily as needed. ) 120 each 0  . valsartan-hydrochlorothiazide (DIOVAN-HCT) 160-12.5 MG tablet Take 1 tablet by mouth every morning.  0  . clindamycin (CLINDAGEL) 1 % gel Apply topically 2 (two) times daily. (Patient not taking: Reported on 01/24/2016) 30 g 1  . hydrocortisone 2.5 % cream Apply topically 2 (two) times daily. (Patient not taking: Reported on 01/24/2016) 30 g 1   No current facility-administered medications for this visit.     REVIEW OF SYSTEMS:   Constitutional: Denies fevers, chills or abnormal night sweats Eyes: Denies blurriness of vision, double vision or watery eyes Ears, nose, mouth, throat, and face: Denies mucositis or sore throat Respiratory: Denies cough, dyspnea or wheezes Cardiovascular: Denies palpitation, chest discomfort or lower extremity swelling Gastrointestinal:  Denies nausea, heartburn or change in bowel habits Skin: Denies abnormal skin rashes Lymphatics: Denies new lymphadenopathy or easy bruising Neurological:Denies numbness, tingling or new weaknesses Behavioral/Psych: Mood is stable, no new changes  All other systems were reviewed with the patient and are negative.  PHYSICAL EXAMINATION: ECOG PERFORMANCE STATUS: 1   Vitals:   01/24/16 1020  BP: (!) 141/94  Pulse: 88  Resp: 17  Temp: 98.3 F (36.8 C)   Filed Weights   01/24/16 1020  Weight: 151 lb 6.4 oz (68.7 kg)    GENERAL:alert, no distress and comfortable SKIN: skin color, texture, turgor are normal, Several scattered acne-like rash in her upper  front chest.  EYES: normal, conjunctiva are pink and non-injected, sclera clear OROPHARYNX:no exudate, no erythema and lips, buccal mucosa, and tongue normal  NECK: supple, thyroid normal size, non-tender, without nodularity LYMPH:  no palpable lymphadenopathy in the cervical, axillary or inguinal LUNGS: clear to auscultation and percussion with normal breathing effort HEART: regular rate & rhythm and no murmurs and no lower extremity edema ABDOMEN:abdomen soft, non-tender and normal bowel sounds Musculoskeletal:no cyanosis of digits and no clubbing  PSYCH: alert & oriented x 3 with fluent speech NEURO: no focal motor/sensory deficits Breasts: Breast inspection showed them to be symmetrical with no skin change or nipple discharge.  Status post right mastectomy and tissue expander placement, surgical scar has healed well, no significant discharge or skin erythema. Palpation of the left breast and bilateral axilla showed no palpable mass.  LABORATORY DATA:  I have reviewed the data as listed CBC Latest Ref Rng & Units 01/24/2016 01/03/2016 12/13/2015  WBC 3.9 - 10.3 10e3/uL 6.0 8.9 7.2  Hemoglobin 11.6 - 15.9 g/dL 8.8(L) 9.0(L) 10.5(L)  Hematocrit 34.8 - 46.6 % 26.4(L) 26.9(L) 31.6(L)  Platelets 145 - 400 10e3/uL 126(L) 146 162    CMP Latest Ref Rng & Units 01/24/2016 01/03/2016 12/13/2015  Glucose 70 - 140 mg/dl 113 100 99  BUN 7.0 - 26.0 mg/dL 13.3 13.9 10.2  Creatinine 0.6 - 1.1 mg/dL 1.0 1.1 0.7  Sodium 136 - 145 mEq/L 141 141 140  Potassium 3.5 - 5.1 mEq/L 3.4(L) 3.4(L) 3.5  Chloride 101 - 111 mmol/L - - -  CO2 22 - 29 mEq/L '25 27 24  '$ Calcium 8.4 - 10.4 mg/dL 9.5 9.7 9.2  Total Protein 6.4 - 8.3 g/dL 7.1 7.3 7.2  Total Bilirubin 0.20 - 1.20 mg/dL <0.30 0.31 0.31  Alkaline Phos 40 - 150 U/L 58 60 61  AST 5 - 34 U/L '23 22 23  '$ ALT 0 - 55 U/L '18 16 13    '$ PATHOLOGY: Diagnosis 09/29/2015 1. Lymph node, sentinel, biopsy, Right axillary #1 - ONE BENIGN LYMPH NODE WITH NO TUMOR SEEN  (0/1). 2. Lymph node, sentinel, biopsy, Right axillary #2 - ONE LYMPH NODE POSITIVE FOR METASTATIC MAMMARY CARCINOMA (1/1). 3. Lymph node, sentinel, biopsy, Right axillary #4 - ONE BENIGN LYMPH NODE WITH NO TUMOR SEEN (0/1). 4. 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 5. 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 - LARGEST FOCUS OF INVASIVE MAMMARY CARCINOMA IS GRADE 2, SHOWS ASSOCIATED LOBULAR CARCINOMA IN SITU AND MEASURES 13 CM IN GREATEST DIMENSION. - SECOND LARGEST FOCUS SHOWS INVASIVE GRADE 3 MAMMARY CARCINOMA AND SPANS 2.1 CM IN GREATEST DIMENSION. - THIRD LARGEST FOCUS SHOWS INVASIVE GRADE 2 MAMMARY CARCINOMA WITH LOBULAR CARCINOMA IN SITU AND MEASURES 1.9 CM IN GREATEST DIMENSION. - SMALLEST FOCUS SHOWS INVASIVE GRADE 3 MAMMARY CARCINOMA AND MEASURES 0.5 CM IN GREATEST DIMENSION. - ANTERIOR INFERIOR SOFT TISSUE MARGIN DEMONSTRATES BROAD POSITIVITY FOR INVASIVE MAMMARY CARCINOMA. - OTHER MARGINS ARE NEGATIVE. - SEE ONCOLOGY TEMPLATE. 6. Lymph node, sentinel, biopsy, Right axillary #3 - ONE BENIGN LYMPH NODE WITH NO TUMOR SEEN (0/1). 7. Lymph node, sentinel, biopsy, Right axillary #6 - ONE LYMPH NODE WITH ISOLATED TUMOR CELLS. 8. Lymph nodes, regional resection, Right axillary contents - ELEVEN BENIGN LYMPH NODES WITH NO TUMOR SEEN (0/11). Specimen, including laterality and lymph node sampling (sentinel, non-sentinel): Right breast with right sentinel and non-sentinel lymph nodes. Procedure: Right modified mastectomy with sentinel lymph node biopsy and axillary dissection. Histologic type: Multifocal mammary carcinoma with ductal and lobular features. Largest focus (13 cm): Grade:  2. Tubule formation: 3. Nuclear pleomorphism: 2. Mitotic: 1. Second largest focus (2.1 cm): Grade: 3. Tubule formation: 3. Nuclear  pleomorphism: 2. Mitotic: 3. Third largest focus (1.9 cm): Grade: 2. Tubule formation: 3. Nuclear pleomorphism: 2. Mitotic: 1. Smallest focus (0.5 cm): Grade: 3. Tubule formation: 3. Nuclear pleomorphism: 2. Mitotic: 3. Tumor size (gross measurements): 13 cm, 2.1 cm, 1.9 cm and 0.5 cm. Margins: Invasive, distance to closest margin: Invasive mammary carcinoma broadly involves the anterior inferior soft tissue margin. In-situ, distance to closest margin: Margin uninvolved by in situ carcinoma. If margin positive, focally or broadly: Broadly positive. Lymphovascular invasion: Yes, identified. Ductal carcinoma in situ: Not identified. Grade: Not applicable. Extensive intraductal component: Not applicable. Lobular neoplasia: Yes, lobular carcinoma in situ is present. Tumor focality: Multifocal. Treatment effect: Not applicable. If present, treatment effect in breast tissue, lymph nodes or both: Not applicable. Extent of tumor: Skin: Not involved. Nipple: Not involved. Skeletal muscle: Not received. Lymph nodes: Examined: 6 Sentinel. 11 Non-sentinel. 17 Total. Lymph nodes with metastasis: 2 with metastasis and 1 with isolated tumor cells. Isolated tumor cells (< 0.2 mm): 1. Micrometastasis: (> 0.2 mm and < 2.0 mm): 0. Macrometastasis: (> 2.0 mm): 2. Extracapsular extension: Not identified.  Breast prognostic profile: Performed on previous case, [id number]. Right 8 o'clock biopsy 12 cm from the nipple: Estrogen receptor: 95%, positive. Progesterone receptor: 95%, positive. Her-2 neu: 2.04 ratio, positive. Ki-67: 30%. Right breast needle core biopsy 6:30 o'clock position 1 cm from the nipple: Estrogen receptor: 90%, positive. Progesterone receptor: 95%, positive. Her-2 neu: 1.39 ratio, negative. Ki-67: 10%. Non-neoplastic breast: No significant non-neoplastic breast findings.  TNM: pT3(m), pN1a. Comments: A cytokeratin AE1/AE3 immunohistochemical stain is performed on  eight blocks containing lymph node tissue (eight stains total). The morphology coupled with the staining pattern is consistent with the above findings. An E-cadherin immunohistochemical stain is also performed on four blocks from the right simple mastectomy specimen. The staining pattern coupled with the morphology is consistent with the above findings. As there was Her-2 neu positivity in one of the biopsies and both biopsies were positive for estrogen receptor, a repeat breast prognostic profile will not be performed unless otherwise requested. Dr. Colonel Bald has seen selected slides 5E, 55F, 5G, 5J, 5N, 5O, and 5P with agreement that the tumor is a mulitfocal invasive mammary carcinoma with a mixed ductal and lobular phenotype. Dr. Berneice Heinrich has also seen a selected slide (5P) with agreement of the positive anterior inferior margin. (RH:ecj 10/03/2015)   RADIOGRAPHIC STUDIES: I have personally reviewed the radiological images as listed and agreed with the findings in the report. No new scans  Bone scan 10/28/2015 IMPRESSION: Small focus of increased radiotracer uptake within the right lateral skull at the region of the right temporal bone. CT of the head may be considered for further evaluation.  Otherwise no evidence of osseous metastatic disease in the axial skeleton.  CT chest, abdomen and pelvis  10/27/2015 IMPRESSION: Status post right mastectomy and right axillary lymph node dissection.  No findings specific for metastatic disease.  Scattered hepatic cysts measuring up to 12 mm. A single 8 mm hypoenhancing lesion is technically indeterminate, but also likely benign, although motion degraded. Consider attention on follow-up.  CT head with and without contrast 11/07/2015 IMPRESSION: 1. Indeterminate 10-11 mm area of heterogeneous bone mineralization along the right sphenoid wing which may correspond to the recent bone scan finding. This might be physiologic. A Repeat CT head (e.g.  in 8-12 weeks, Noncontrast should suffice but with  and without contrast could be done if repeat brain staging is desired) may be most valuable to evaluate stability. 2. No CT evidence of metastatic disease to the brain. Mild nonspecific white matter changes.   ASSESSMENT & PLAN:  40 year old African-American female, premenopausal, presented with a palpable right breast mass.  1. 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 Her initial biopsy showed 1 triple positive disease, the other was ER/PR positive and HER2 Negative disease. -Given her stage III disease, I recommend staging CT chest, abdomen and pelvis with contrast, and bone scan to ruled out distant metastasis. -We discussed that HER-2 positive breast cancer are more aggressive, with a high risk for recurrence after surgery, especially in the setting of locally advanced stage. I recommend her to have adjuvant chemotherapy TCHP (docetaxel, carboplatin, Herceptin, pejeta) every 3 weeks for total of 6 cycles, followed by Herceptin maintenance therapy to complete 1 year treatment. -Her baseline echo was normal, which was done in April 2017, she will follow-up with cardiologist to monitor her heart during her and her to therapy -I discussed her staging CT and bone scan results, showed no definitive evidence of metastasis. This mild uptake in the right lateral skull on bone scan, and I got a CT head for further evaluation, which showed a small area of heterogeneous bone mineralization, corresponding to the bone scan findings. This is likely physiological. -She had severe back pain and fatigue on day 6 after chemo, probably related to her  Neulasta injection, I'll hold it for now. -She developed a prolonged nausea after chemotherapy, responded well to dexamethasone. I instructed her to use dexamethasone 4 mg daily for 3-5 days after chemotherapy, starting day 3 -Lab reviewed, adequate for treatment, will proceed to cycle 5 chemotherapy today. -Due to the large size of tumor and node positive disease, she would need postmastectomy chest wall and axilla radiation. She is scheduled to see rad/onc this week   2. Iron deficient anemia, and chemo induced anemia  -Her lab tests showed a moderate anemia with hemoglobin 7.8, low MCV 62.7, her serum iron level and transferrin saturation on low, ferritin 25, this is consistent with iron deficient anemia, likely secondary to her menorrhagia. -Her anemia has resolved after IV Feraheme, slightly worse after she started chemotherapy. -I encouraged her to continue oral iron pill -Her repeated on level and a ferritin have normalized last month.  -Consider blood transfusion if hemoglobin less than 8, or symptomatic anemia  3. Genetic -Given her young age and positive family history, we strongly recommend her to [facility] genetic counseling for genetic testing to ruled out inheritable breast cancer syndrome. She agrees. -Her genetic testing was normal  4.  HTN -She was noticed to have significant elevated blood pressure lately  -She has seen her primary care physician, and her blood pressure medication has been adjusted, including HCTZ -We discussed the impact of chemotherapy on her blood pressure. If she has diarrhea, or poor by mouth intake, she may need to hold her HCTZ  5. Right arm lymphedema  -Secondary to breast surgery and lymph node biopsy -I encouraged her to contact her physical therapist for device of exercise and sleeve use   6. Nausea and diarrhea -Secondary to chemotherapy -She will continue  use Zofran, Compazine as needed, and dexamethasone 3-5 days after chemotherapy -She knows to  use Imodium as needed for diarrhea -Overall her symptoms are manageable  Plan -Lab results reviewed with her, we'll proceed to cycle 5 chemotherapy today -Return to clinic in 3 weeks for next cycle chemotherapy  All questions were answered. The patient and her family members agree with the above plan. The patient knows to call the clinic with any problems, questions or concerns.  I spent 20 minutes counseling the patient face to face. The total time spent in the appointment was 25 minutes and more than 50% was on counseling.     Truitt Merle, MD 01/24/2016 10:22 PM

## 2016-01-24 NOTE — Telephone Encounter (Signed)
Gv pt appt for 02/14/16.

## 2016-01-25 ENCOUNTER — Encounter: Payer: Self-pay | Admitting: Radiation Oncology

## 2016-01-25 ENCOUNTER — Ambulatory Visit
Admission: RE | Admit: 2016-01-25 | Discharge: 2016-01-25 | Disposition: A | Discharge status: Home / Self Care | Payer: BLUE CROSS/BLUE SHIELD | Source: Ambulatory Visit | Attending: Radiation Oncology | Admitting: Radiation Oncology

## 2016-01-25 VITALS — BP 156/102 | HR 86 | Temp 97.7°F | Ht 63.0 in | Wt 153.4 lb

## 2016-01-25 DIAGNOSIS — Z170 Estrogen receptor positive status [ER+]: Secondary | ICD-10-CM | POA: Diagnosis present

## 2016-01-25 DIAGNOSIS — Z800 Family history of malignant neoplasm of digestive organs: Secondary | ICD-10-CM | POA: Diagnosis not present

## 2016-01-25 DIAGNOSIS — E785 Hyperlipidemia, unspecified: Secondary | ICD-10-CM | POA: Diagnosis not present

## 2016-01-25 DIAGNOSIS — K219 Gastro-esophageal reflux disease without esophagitis: Secondary | ICD-10-CM | POA: Diagnosis not present

## 2016-01-25 DIAGNOSIS — D649 Anemia, unspecified: Secondary | ICD-10-CM | POA: Diagnosis not present

## 2016-01-25 DIAGNOSIS — C50511 Malignant neoplasm of lower-outer quadrant of right female breast: Secondary | ICD-10-CM | POA: Diagnosis present

## 2016-01-25 DIAGNOSIS — Z9221 Personal history of antineoplastic chemotherapy: Secondary | ICD-10-CM | POA: Diagnosis not present

## 2016-01-25 DIAGNOSIS — Z803 Family history of malignant neoplasm of breast: Secondary | ICD-10-CM | POA: Insufficient documentation

## 2016-01-25 DIAGNOSIS — Z801 Family history of malignant neoplasm of trachea, bronchus and lung: Secondary | ICD-10-CM | POA: Insufficient documentation

## 2016-01-25 DIAGNOSIS — Z8042 Family history of malignant neoplasm of prostate: Secondary | ICD-10-CM | POA: Insufficient documentation

## 2016-01-25 DIAGNOSIS — Z510 Encounter for antineoplastic radiation therapy: Secondary | ICD-10-CM | POA: Insufficient documentation

## 2016-01-25 DIAGNOSIS — Z9011 Acquired absence of right breast and nipple: Secondary | ICD-10-CM | POA: Diagnosis not present

## 2016-01-25 NOTE — Progress Notes (Signed)
Please see the Nurse Progress Note in the MD Initial Consult Encounter for this patient. 

## 2016-01-25 NOTE — Progress Notes (Signed)
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 female who felt a breast lump and noticed mild intermittent breast pain. A right breast needle core biopsy, 8:00 o'clock, 12 CMFN (biopsy 1) was performed on 07/26/15. This revealed invasive ductile carcinoma. Right breast needle core biopsy at 6:30 o'clock, 1 CMFN (biopsy 2) revealed invasive and in situ mammary carcinoma, and lymphovascular invasion was identified. Receptor status of biopsy 1: ER (95%), PR (95%), Her2 (+), Ki (30%). Receptor status of biopsy 2: ER (90%), PR (95%), Her2 (-), Ki (10%). A needle/core biopsy of the lymph node, right axilla was performed on 08/11/15. This revealed one benign lymph node (0/1)  Patient underwent right total mastectomy with axillary sentinel node biopsy performed by Dr. Johna Sheriff on 09/29/15. The sentinel lymph node biopsy of right axillary #1 revealed one benign lymph node with no tumor seen (0/1). The sentinel lymph node biopsy of right axillary #2 revealed one lymph node positive for metastatic mammary carcinoma (1/1).Sentinel lymph node biopsy of the right axillary #3 revealed one benign lymph node with no tumor seen (0/1). The sentinel lymph node biopsy of right axillary #4 revealed one benign lymph node with no tumor seen (0/1). The sentinel lymph node of right axillary #5 revealed one lymph node positive for metastatic mammary carcinoma (1/1). The sentinel lymph node biopsy of right axillary #6 revealed one lymph node with isolated tumor cells.  Lymph nodes regional resection of right axillary contents revealed eleven benign lymph nodes with no tumor seen (0/11). Simple mastectomy, right breast-suture marks axillary tail revealed multifocal mammary carcinoma with ductal and lobular features. Largest focus of invasive mammary carcinoma is grade Ii, shows associate lobular carcinoma in situ and measures 13 cm in greatest dimension. Second largest focus shows invasive grade 3 mammary carcinoma and spans 2.1 cm in greatest dimension. The third largest focus shows invasive grade II mammary carcinoma with lobular carcinoma in situ and measures 1.9 cm in greatest dimension. Smallest focus shows invasive grade III mammary carcinoma and measures 0.5 cm in greatest dimension. Anterior inferior soft tissue margin demonstrates broad positivity for invasive mammary carcinoma. Other margins are negative.  Patient then underwent breast reconstruction with placement of tissue expander and flex HD (acellular hydrated dermis) performed by Dr. Ulice Bold on 09/29/15. Patient began adjuvant chemotherapy TCHP (docetaxel, carboplatin, herceptin, pejeta). She will have treatment every 3 weeks for a total of 6 cycles, followed by Herceptin maintenance therapy to complete 1 year treatment. Last chemotherapy will be on 02/14/16.  Patient reports having more swelling in her lower right arm on Sunday. She wears a lymphedema sleeve. Patient denies pain at this time.   PREVIOUS RADIATION THERAPY: No  PAST MEDICAL HISTORY:  has a past medical history of Anemia; Anxiety; Breast cancer of lower-outer quadrant of right female breast (HCC) (07/29/2015); Family history of breast cancer; Family history of colon cancer; Family history of prostate cancer; GERD (gastroesophageal reflux disease); Gestational diabetes; Hyperlipidemia; Hypertension; and Preterm labor.    PAST SURGICAL HISTORY: Past Surgical History:  Procedure Laterality Date  . AXILLARY LYMPH  NODE DISSECTION Right  09/29/2015   Procedure: AXILLARY LYMPH NODE DISSECTION;  Surgeon: Glenna Fellows, MD;  Location: [facility] OR;  Service: General;  Laterality: Right;  . BREAST RECONSTRUCTION WITH PLACEMENT OF TISSUE EXPANDER AND FLEX HD (ACELLULAR HYDRATED DERMIS) Right 09/29/2015   Procedure: BREAST RECONSTRUCTION WITH PLACEMENT OF TISSUE EXPANDER AND FLEX HD (ACELLULAR HYDRATED DERMIS);  Surgeon: Alena Bills Dillingham, DO;  Location: [facility] OR;  Service: Plastics;  Laterality: Right;  . CERVICAL CERCLAGE  02/19/2011   Procedure: CERCLAGE CERVICAL;  Surgeon: Roseanna Rainbow, MD;  Location: [facility];  Service: Gynecology;  Laterality: N/A;  . DILATION AND CURETTAGE OF UTERUS    . MASTECTOMY WITH AXILLARY LYMPH NODE DISSECTION Right 09/29/2015  . PORTACATH PLACEMENT N/A 09/29/2015   Procedure: INSERTION PORT-A-CATH;  Surgeon: Glenna Fellows, MD;  Location: [facility] OR;  Service: General;  Laterality: N/A;  . SIMPLE MASTECTOMY WITH AXILLARY SENTINEL NODE BIOPSY Right 09/29/2015   Procedure: RIGHT TOTAL  MASTECTOMY WITH AXILLARY SENTINEL NODE BIOPSY;  Surgeon: Glenna Fellows, MD;  Location: [facility] OR;  Service: General;  Laterality: Right;    FAMILY HISTORY: family history includes Breast cancer (age of onset: 34) in her maternal aunt; Breast cancer (age of onset: 52) in her paternal aunt; Cirrhosis in her maternal uncle; Colon cancer in her cousin; Hypertension in her father and mother; Lung cancer in her cousin; Prostate cancer in her paternal uncle and paternal uncle; Prostate cancer (age of onset: 67) in her father; Throat cancer in her maternal aunt.  SOCIAL HISTORY:  reports that she has never smoked. She has never used smokeless tobacco. She reports that she does not drink alcohol or use drugs.  ALLERGIES: Review of patient's allergies indicates no known allergies.  MEDICATIONS:  Current Outpatient Prescriptions  Medication Sig Dispense Refill  . amLODipine (NORVASC) 5 MG tablet Take 5 mg by mouth at bedtime.  0  . B  Complex-C (SUPER B COMPLEX PO) Take 1 tablet by mouth daily.    Marland Kitchen dexamethasone (DECADRON) 4 MG tablet Take one tab in am daily x 3-5 days & stop if nausea better 20 tablet 0  . diphenhydrAMINE (BENADRYL) 12.5 MG/5ML elixir Take 5 mLs (12.5 mg total) by mouth every 6 (six) hours as needed for itching. 120 mL 0  . ferrous sulfate 325 (65 FE) MG tablet Take 325 mg by mouth 2 (two) times daily with a meal.    . ibuprofen (ADVIL,MOTRIN) 200 MG tablet Take 200 mg by mouth every 6 (six) hours as needed.    . lidocaine-prilocaine (EMLA) cream Apply to affected area once 30 g 3  . LORazepam (ATIVAN) 0.5 MG tablet Take 1 tablet (0.5 mg total) by mouth at bedtime as needed for anxiety (nausea). 30 tablet 0  . Multiple Vitamin (MULTIVITAMIN) tablet Take 1 tablet by mouth daily.    . ondansetron (ZOFRAN) 8 MG tablet Take 1 tablet (8 mg total) by mouth 2 (two) times daily as needed for refractory nausea / vomiting. Start on day 3 after chemo. 30 tablet 1  . ondansetron (ZOFRAN-ODT) 4 MG disintegrating tablet Take 4 mg by mouth every 8 (eight) hours as needed for nausea or vomiting. Reported on 11/01/2015  0  . polyethylene glycol (MIRALAX / GLYCOLAX) packet Take 17 g by mouth daily as needed for mild constipation. 14 each 0  . Potassium Chloride ER 20 MEQ TBCR Take 20 mEq by mouth daily. 30 tablet 1  . senna (SENOKOT) 8.6 MG TABS tablet Take 1 tablet (8.6 mg  total) by mouth 2 (two) times daily. (Patient taking differently: Take 1 tablet by mouth daily as needed. ) 120 each 0  . valsartan-hydrochlorothiazide (DIOVAN-HCT) 160-12.5 MG tablet Take 1 tablet by mouth every morning.  0  . clindamycin (CLINDAGEL) 1 % gel Apply topically 2 (two) times daily. (Patient not taking: Reported on 01/25/2016) 30 g 1  . hydrocortisone 2.5 % cream Apply topically 2 (two) times daily. (Patient not taking: Reported on 01/25/2016) 30 g 1  . prochlorperazine (COMPAZINE) 10 MG tablet Take 1 tablet (10 mg total) by mouth every 6 (six)  hours as needed (Nausea or vomiting). (Patient not taking: Reported on 01/25/2016) 30 tablet 1   No current facility-administered medications for this encounter.     REVIEW OF SYSTEMS:  A 15 point review of systems is documented in the electronic medical record. This was obtained by the nursing staff. However, I reviewed this with the patient to discuss relevant findings and make appropriate changes.     PHYSICAL EXAM:  height is '5\' 3"'$  (1.6 m) and weight is 153 lb 6.4 oz (69.6 kg). Her oral temperature is 97.7 F (36.5 C). Her blood pressure is 156/102 (abnormal) and her pulse is 86. Her oxygen saturation is 100%.     General: Alert and oriented, in no acute distress HEENT: Head is normocephalic. Extraocular movements are intact. Oropharynx is clear. Neck: Neck is supple, no palpable cervical or supraclavicular lymphadenopathy. Heart: Regular in rate and rhythm with no murmurs, rubs, or gallops. Chest: Clear to auscultation bilaterally, with no rhonchi, wheezes, or rales. Breast: Left breast is large and pendulous without mass or nipple discharge. Portacath in place in left chest. Right chest shows tissue expander in place. Mastectomy scar well healed without sign of drainage or infection. NO palpable or visible signs of recurrence.   ECOG = 1  LABORATORY DATA:  Lab Results  Component Value Date   WBC 6.0 01/24/2016   HGB 8.8 (L) 01/24/2016   HCT 26.4 (L) 01/24/2016   MCV 90.4 01/24/2016   PLT 126 (L) 01/24/2016   NEUTROABS 3.8 01/24/2016   Lab Results  Component Value Date   NA 141 01/24/2016   K 3.4 (L) 01/24/2016   CL 101 09/29/2015   CO2 25 01/24/2016   GLUCOSE 113 01/24/2016   CREATININE 1.0 01/24/2016   CALCIUM 9.5 01/24/2016      RADIOGRAPHY: No results found.    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 Pathology report from mastectomy specimen shows invasive mammary carcinoma broadly involving the anterior inferior soft tissue margin. In light of this finding also consider a boost after her chest wall radiation therapy. Discussed the course of treatment, side effects, and potential toxicities of post mastectomy radiation as it relates to her reconstruction plans. Patient appears to understand and wishes to proceed with post mastectomy treatments and will electively receive treatment to the high axilla/supraclav. area with her radiation fields.  PLAN: Patient will be set up for simulation appointment approximately 2 weeks after last chemotherapy treatment (02/14/16). Start radiation treatments approximately 3 weeks out from her chemotherapy.     ------------------------------------------------  Blair Promise, PhD, MD  This document serves as a record of services personally performed by [name], MD.  It was created on his behalf by Bethann Humble, a trained medical scribe. The creation of this record is based on the scribe's personal observations and the provider's statements to them. This document has been checked and approved by the attending provider.

## 2016-02-09 ENCOUNTER — Encounter: Payer: Self-pay | Admitting: Radiation Oncology

## 2016-02-09 NOTE — Progress Notes (Signed)
Paperwork ([facility]) received 02/09/16, patient hasn't started treatment yet, given to the nurse to hold til then

## 2016-02-14 ENCOUNTER — Ambulatory Visit (HOSPITAL_BASED_OUTPATIENT_CLINIC_OR_DEPARTMENT_OTHER): Payer: BLUE CROSS/BLUE SHIELD | Admitting: Hematology

## 2016-02-14 ENCOUNTER — Ambulatory Visit: Payer: BLUE CROSS/BLUE SHIELD

## 2016-02-14 ENCOUNTER — Other Ambulatory Visit (HOSPITAL_BASED_OUTPATIENT_CLINIC_OR_DEPARTMENT_OTHER): Payer: BLUE CROSS/BLUE SHIELD

## 2016-02-14 ENCOUNTER — Ambulatory Visit (HOSPITAL_BASED_OUTPATIENT_CLINIC_OR_DEPARTMENT_OTHER): Payer: BLUE CROSS/BLUE SHIELD

## 2016-02-14 ENCOUNTER — Encounter: Payer: Self-pay | Admitting: *Deleted

## 2016-02-14 VITALS — BP 141/84 | HR 100 | Temp 98.5°F | Resp 18 | Ht 63.0 in | Wt 155.2 lb

## 2016-02-14 DIAGNOSIS — D500 Iron deficiency anemia secondary to blood loss (chronic): Secondary | ICD-10-CM

## 2016-02-14 DIAGNOSIS — I10 Essential (primary) hypertension: Secondary | ICD-10-CM

## 2016-02-14 DIAGNOSIS — C50511 Malignant neoplasm of lower-outer quadrant of right female breast: Secondary | ICD-10-CM

## 2016-02-14 DIAGNOSIS — I890 Lymphedema, not elsewhere classified: Secondary | ICD-10-CM | POA: Diagnosis not present

## 2016-02-14 DIAGNOSIS — Z5112 Encounter for antineoplastic immunotherapy: Secondary | ICD-10-CM | POA: Diagnosis not present

## 2016-02-14 DIAGNOSIS — R197 Diarrhea, unspecified: Secondary | ICD-10-CM

## 2016-02-14 DIAGNOSIS — Z170 Estrogen receptor positive status [ER+]: Principal | ICD-10-CM

## 2016-02-14 DIAGNOSIS — D6481 Anemia due to antineoplastic chemotherapy: Secondary | ICD-10-CM | POA: Diagnosis not present

## 2016-02-14 DIAGNOSIS — R110 Nausea: Secondary | ICD-10-CM

## 2016-02-14 DIAGNOSIS — Z5111 Encounter for antineoplastic chemotherapy: Secondary | ICD-10-CM

## 2016-02-14 DIAGNOSIS — D509 Iron deficiency anemia, unspecified: Secondary | ICD-10-CM

## 2016-02-14 LAB — CBC WITH DIFFERENTIAL/PLATELET
BASO%: 0.3 % (ref 0.0–2.0)
Basophils Absolute: 0 10*3/uL (ref 0.0–0.1)
EOS%: 0 % (ref 0.0–7.0)
Eosinophils Absolute: 0 10*3/uL (ref 0.0–0.5)
HCT: 25.1 % — ABNORMAL LOW (ref 34.8–46.6)
HGB: 8.3 g/dL — ABNORMAL LOW (ref 11.6–15.9)
LYMPH%: 30.2 % (ref 14.0–49.7)
MCH: 30.4 pg (ref 25.1–34.0)
MCHC: 33.1 g/dL (ref 31.5–36.0)
MCV: 91.8 fL (ref 79.5–101.0)
MONO#: 0.4 10*3/uL (ref 0.1–0.9)
MONO%: 7.9 % (ref 0.0–14.0)
NEUT#: 3.4 10*3/uL (ref 1.5–6.5)
NEUT%: 61.6 % (ref 38.4–76.8)
Platelets: 90 10*3/uL — ABNORMAL LOW (ref 145–400)
RBC: 2.74 10*6/uL — ABNORMAL LOW (ref 3.70–5.45)
RDW: 16.9 % — ABNORMAL HIGH (ref 11.2–14.5)
WBC: 5.5 10*3/uL (ref 3.9–10.3)
lymph#: 1.7 10*3/uL (ref 0.9–3.3)

## 2016-02-14 LAB — IRON AND TIBC
%SAT: 31 % (ref 21–57)
Iron: 82 ug/dL (ref 41–142)
TIBC: 265 ug/dL (ref 236–444)
UIBC: 183 ug/dL (ref 120–384)

## 2016-02-14 LAB — COMPREHENSIVE METABOLIC PANEL
ALT: 16 U/L (ref 0–55)
AST: 24 U/L (ref 5–34)
Albumin: 3.5 g/dL (ref 3.5–5.0)
Alkaline Phosphatase: 54 U/L (ref 40–150)
Anion Gap: 11 mEq/L (ref 3–11)
BUN: 12.3 mg/dL (ref 7.0–26.0)
CO2: 25 mEq/L (ref 22–29)
Calcium: 9.4 mg/dL (ref 8.4–10.4)
Chloride: 104 mEq/L (ref 98–109)
Creatinine: 1 mg/dL (ref 0.6–1.1)
EGFR: 81 mL/min/{1.73_m2} — ABNORMAL LOW (ref >90)
Glucose: 120 mg/dl (ref 70–140)
Potassium: 3.4 mEq/L — ABNORMAL LOW (ref 3.5–5.1)
Sodium: 140 mEq/L (ref 136–145)
Total Bilirubin: 0.3 mg/dL (ref 0.20–1.20)
Total Protein: 7.2 g/dL (ref 6.4–8.3)

## 2016-02-14 LAB — FERRITIN: Ferritin: 224 ng/ml (ref 9–269)

## 2016-02-14 MED ORDER — SODIUM CHLORIDE 0.9 % IV SOLN
Freq: Once | INTRAVENOUS | Status: AC
  Administered 2016-02-14: 10:00:00 via INTRAVENOUS

## 2016-02-14 MED ORDER — SODIUM CHLORIDE 0.9 % IV SOLN: Freq: Once | INTRAVENOUS | Status: DC

## 2016-02-14 MED ORDER — HEPARIN SOD (PORK) LOCK FLUSH 100 UNIT/ML IV SOLN
500.0000 [IU] | Freq: Once | INTRAVENOUS | Status: AC | PRN
  Administered 2016-02-14: 500 [IU]
  Filled 2016-02-14: qty 5

## 2016-02-14 MED ORDER — DEXAMETHASONE SODIUM PHOSPHATE 10 MG/ML IJ SOLN
10.0000 mg | Freq: Once | INTRAMUSCULAR | Status: AC
  Administered 2016-02-14: 10 mg via INTRAVENOUS

## 2016-02-14 MED ORDER — SODIUM CHLORIDE 0.9 % IV SOLN
75.0000 mg/m2 | Freq: Once | INTRAVENOUS | Status: AC
  Administered 2016-02-14: 130 mg via INTRAVENOUS
  Filled 2016-02-14: qty 13

## 2016-02-14 MED ORDER — ACETAMINOPHEN 325 MG PO TABS
ORAL_TABLET | ORAL | Status: AC
  Filled 2016-02-14: qty 2

## 2016-02-14 MED ORDER — SODIUM CHLORIDE 0.9% FLUSH
10.0000 mL | INTRAVENOUS | Status: DC | PRN
  Administered 2016-02-14: 10 mL
  Filled 2016-02-14: qty 10

## 2016-02-14 MED ORDER — PALONOSETRON HCL INJECTION 0.25 MG/5ML
INTRAVENOUS | Status: AC
  Filled 2016-02-14: qty 5

## 2016-02-14 MED ORDER — PALONOSETRON HCL INJECTION 0.25 MG/5ML
0.2500 mg | Freq: Once | INTRAVENOUS | Status: AC
  Administered 2016-02-14: 0.25 mg via INTRAVENOUS

## 2016-02-14 MED ORDER — ACETAMINOPHEN 325 MG PO TABS
650.0000 mg | ORAL_TABLET | Freq: Once | ORAL | Status: AC
  Administered 2016-02-14: 650 mg via ORAL

## 2016-02-14 MED ORDER — DEXAMETHASONE SODIUM PHOSPHATE 10 MG/ML IJ SOLN
INTRAMUSCULAR | Status: AC
  Filled 2016-02-14: qty 1

## 2016-02-14 MED ORDER — DIPHENHYDRAMINE HCL 25 MG PO CAPS
ORAL_CAPSULE | ORAL | Status: AC
  Filled 2016-02-14: qty 2

## 2016-02-14 MED ORDER — DIPHENHYDRAMINE HCL 25 MG PO CAPS
50.0000 mg | ORAL_CAPSULE | Freq: Once | ORAL | Status: AC
  Administered 2016-02-14: 50 mg via ORAL

## 2016-02-14 MED ORDER — TRASTUZUMAB CHEMO 150 MG IV SOLR
6.0000 mg/kg | Freq: Once | INTRAVENOUS | Status: AC
  Administered 2016-02-14: 399 mg via INTRAVENOUS
  Filled 2016-02-14: qty 19

## 2016-02-14 MED ORDER — SODIUM CHLORIDE 0.9 % IV SOLN
420.0000 mg | Freq: Once | INTRAVENOUS | Status: AC
  Administered 2016-02-14: 420 mg via INTRAVENOUS
  Filled 2016-02-14: qty 14

## 2016-02-14 MED ORDER — SODIUM CHLORIDE 0.9 % IJ SOLN
10.0000 mL | INTRAMUSCULAR | Status: DC | PRN
  Administered 2016-02-14: 10 mL via INTRAVENOUS
  Filled 2016-02-14: qty 10

## 2016-02-14 MED ORDER — SODIUM CHLORIDE 0.9 % IV SOLN
504.5000 mg | Freq: Once | INTRAVENOUS | Status: AC
  Administered 2016-02-14: 500 mg via INTRAVENOUS
  Filled 2016-02-14: qty 50

## 2016-02-14 NOTE — Progress Notes (Signed)
[place]  [phone] [phone]  Clinic Follow Up Note   Patient Care Team: Lucianne Lei, MD as PCP - General (Family Medicine) Excell Seltzer, MD as Consulting Physician (General Surgery) Thea Silversmith, MD as Consulting Physician (Radiation Oncology) 02/22/2016  CHIEF COMPLAINTS:  Follow up right breast cancer  Oncology History   Breast cancer of lower-outer quadrant of right female breast [facility])   Staging form: Breast, AJCC 7th Edition     Clinical stage from 08/03/2015: Stage IA (T1c, N0, M0) - Unsigned     Pathologic stage from 09/29/2015: Stage IIIA (T3(m), N1a, cM0) - Signed by Truitt Merle, MD on 10/17/2015         Breast cancer of lower-outer quadrant of right female breast ([place])   07/25/2015 Mammogram    diagnostic mammogram and ultrasound showed a 11 mm mass in the 8:00 position of the right breast, small irregular lesion in the retroareolar regio of the right breast, contiguous with the dermis      07/26/2015 Initial Diagnosis    Breast cancer of lower-outer quadrant of right female breast ([place])      07/26/2015 Initial Biopsy    right breast needle core biopsy at 8:00 position (1), invasive ductal carcinoma, grade 3, 6:30 o'clock position invasive (2) and in situ ductal carcinoma, grade 1-2, lymphovascular invasion is identified      07/26/2015 Receptors her2    (1) ER 95% positive, PR 95% positive, Ki-67 30%, HER-2 positive with ratio 2.04, copy # 5.20, (2) ER 90% positive, PR 95% positive, Ki-67 10%, HER-2 negative.      08/02/2015 Imaging    breast MRI showed extensive mass  and non-mass enhancement in the lower outer quadrant of the right breast  spanning 13 cm, focal enhancement within the right nipple, at least 3-4 abnormal appearing right axillary lymph node.      09/29/2015 Surgery    right mastectomy and axillar node dissection       09/29/2015 Pathology Results    Right mastectomy and axillary node dissection showed  multifocal mammary carcinoma with ductal and lobular features, tumor size 13 cm, tubal 1 cm, 1.9 cm, 2.5 cm, grade 2-3, anterior margins were positive, 2 nodes with metastasis and one with isolated cells      11/01/2015 -  Chemotherapy          HISTORY OF PRESENTING ILLNESS (08/03/2015):  Melanie Moss 40 y.o. female is here because of Her newly diagnosed right breast cancer. She is accompanied by her fianc, sister and mother to our multidisciplinary breast clinic today.  She felt a breast lump and noticed mild intermittent breast pain 2 weeks ago, no nipple discharge or skin change. She also has moderate fatigue and dyspnea on moderate to heavy exertion for a few months, no weight loss, no other pain or other symptoms.  She had normal mammgram 6 years ago, for a small lump which felt to be benign and no biopsy was done.    She has iregular period, twice a month, it lasts about 5-7 days, heavy for 3 days, she chanes 6-7 times a day. She was found to be anemic recently, and has started taking oral iron pill a few weeks ago, tolerates well.  GYN HISTORY  Menarchal: 10  LMP: 07/29/2015 Contraceptive: no  HRT: n/a  G4P1: 3 miscarrage, one 33 yo son, no plan to have more children   CURRENT THERAPY: Adjuvant chemo TCHP every 3 weeks, started on 11/01/2015  INTERIM HISTORY:  [name] returns for follow-up and last cycle chemotherapy. She is doing Moderately well overall. She had more fatigue from last cycle chemotherapy, has recovered well. She had a mild diarrhea, managed by Imodium. No other new complaints.  MEDICAL HISTORY:  Past Medical History:  Diagnosis Date  . Anemia   . Anxiety   . Breast cancer of lower-outer quadrant of right female breast ([place]) 07/29/2015  . Family history of breast cancer   . Family history of colon cancer   . Family history of prostate cancer   . GERD (gastroesophageal reflux disease)    tums   . Gestational diabetes   . Hyperlipidemia   . Hypertension     not currently on medication  . Preterm labor     SURGICAL HISTORY: Past Surgical History:  Procedure Laterality Date  . AXILLARY LYMPH NODE DISSECTION Right 09/29/2015   Procedure: AXILLARY LYMPH NODE DISSECTION;  Surgeon: Excell Seltzer, MD;  Location: [place];  Service: General;  Laterality: Right;  . BREAST RECONSTRUCTION WITH PLACEMENT OF TISSUE EXPANDER AND FLEX HD (ACELLULAR HYDRATED DERMIS) Right 09/29/2015   Procedure: BREAST RECONSTRUCTION WITH PLACEMENT OF TISSUE EXPANDER AND FLEX HD (ACELLULAR HYDRATED DERMIS);  Surgeon: Loel Lofty Dillingham, DO;  Location: [place];  Service: Plastics;  Laterality: Right;  . CERVICAL CERCLAGE  02/19/2011   Procedure: CERCLAGE CERVICAL;  Surgeon: Agnes Lawrence, MD;  Location: [place] [facility];  Service: Gynecology;  Laterality: N/A;  . DILATION AND CURETTAGE OF UTERUS    . MASTECTOMY WITH AXILLARY LYMPH NODE DISSECTION Right 09/29/2015  . PORTACATH PLACEMENT N/A 09/29/2015   Procedure: INSERTION PORT-A-CATH;  Surgeon: Excell Seltzer, MD;  Location: [place];  Service: General;  Laterality: N/A;  . SIMPLE MASTECTOMY WITH AXILLARY SENTINEL NODE BIOPSY Right 09/29/2015   Procedure: RIGHT TOTAL  MASTECTOMY WITH AXILLARY SENTINEL NODE BIOPSY;  Surgeon: Excell Seltzer, MD;  Location: [place];  Service: General;  Laterality: Right;    SOCIAL HISTORY: Social History   Social History  . Marital status: Single    Spouse name: N/A  . Number of children: 1  . Years of education: N/A   Occupational History  . Not on file.   Social History Main Topics  . Smoking status: Never Smoker  . Smokeless tobacco: Never Used  . Alcohol use No  . Drug use: No  . Sexual activity: Yes   Other Topics Concern  . Not on file   Social History Narrative  . No narrative on file    FAMILY HISTORY: Family History  Problem Relation Age of Onset  . Hypertension Mother   . Hypertension Father   . Prostate cancer Father 59  . Breast cancer Maternal Aunt 29  . Breast  cancer Paternal Aunt 9  . Cirrhosis Maternal Uncle   . Prostate cancer Paternal Uncle   . Throat cancer Maternal Aunt     non smoker  . Prostate cancer Paternal Uncle   . Colon cancer Cousin     maternal first cousin dx in his 8s  . Lung cancer Cousin     smoker    ALLERGIES:  has No Known Allergies.  MEDICATIONS:  Current Outpatient Prescriptions  Medication Sig Dispense Refill  . amLODipine (NORVASC) 5 MG tablet Take 5 mg by mouth at bedtime.  0  . B Complex-C (SUPER B COMPLEX PO) Take 1 tablet by mouth daily.    Marland Kitchen dexamethasone (DECADRON) 4 MG tablet Take one tab in am daily x 3-5 days & stop if nausea  better 20 tablet 0  . diphenhydrAMINE (BENADRYL) 12.5 MG/5ML elixir Take 5 mLs (12.5 mg total) by mouth every 6 (six) hours as needed for itching. 120 mL 0  . ferrous sulfate 325 (65 FE) MG tablet Take 325 mg by mouth 2 (two) times daily with a meal.    . ibuprofen (ADVIL,MOTRIN) 200 MG tablet Take 200 mg by mouth every 6 (six) hours as needed.    . lidocaine-prilocaine (EMLA) cream Apply to affected area once 30 g 3  . LORazepam (ATIVAN) 0.5 MG tablet Take 1 tablet (0.5 mg total) by mouth at bedtime as needed for anxiety (nausea). 30 tablet 0  . Multiple Vitamin (MULTIVITAMIN) tablet Take 1 tablet by mouth daily.    . ondansetron (ZOFRAN) 8 MG tablet Take 1 tablet (8 mg total) by mouth 2 (two) times daily as needed for refractory nausea / vomiting. Start on day 3 after chemo. 30 tablet 1  . polyethylene glycol (MIRALAX / GLYCOLAX) packet Take 17 g by mouth daily as needed for mild constipation. 14 each 0  . Potassium Chloride ER 20 MEQ TBCR Take 20 mEq by mouth daily. 30 tablet 1  . senna (SENOKOT) 8.6 MG TABS tablet Take 1 tablet (8.6 mg total) by mouth 2 (two) times daily. (Patient taking differently: Take 1 tablet by mouth daily as needed. ) 120 each 0  . valsartan-hydrochlorothiazide (DIOVAN-HCT) 160-12.5 MG tablet Take 1 tablet by mouth every morning.  0  . clindamycin  (CLINDAGEL) 1 % gel Apply topically 2 (two) times daily. (Patient not taking: Reported on 02/14/2016) 30 g 1  . hydrocortisone 2.5 % cream Apply topically 2 (two) times daily. (Patient not taking: Reported on 02/14/2016) 30 g 1  . ondansetron (ZOFRAN-ODT) 4 MG disintegrating tablet Take 4 mg by mouth every 8 (eight) hours as needed for nausea or vomiting. Reported on 11/01/2015  0  . prochlorperazine (COMPAZINE) 10 MG tablet Take 1 tablet (10 mg total) by mouth every 6 (six) hours as needed (Nausea or vomiting). (Patient not taking: Reported on 02/14/2016) 30 tablet 1   No current facility-administered medications for this visit.     REVIEW OF SYSTEMS:   Constitutional: Denies fevers, chills or abnormal night sweats Eyes: Denies blurriness of vision, double vision or watery eyes Ears, nose, mouth, throat, and face: Denies mucositis or sore throat Respiratory: Denies cough, dyspnea or wheezes Cardiovascular: Denies palpitation, chest discomfort or lower extremity swelling Gastrointestinal:  Denies nausea, heartburn or change in bowel habits Skin: Denies abnormal skin rashes Lymphatics: Denies new lymphadenopathy or easy bruising Neurological:Denies numbness, tingling or new weaknesses Behavioral/Psych: Mood is stable, no new changes  All other systems were reviewed with the patient and are negative.  PHYSICAL EXAMINATION: ECOG PERFORMANCE STATUS: 1   Vitals:   02/14/16 0935  BP: (!) 141/84  Pulse: 100  Resp: 18  Temp: 98.5 F (36.9 C)   Filed Weights   02/14/16 0935  Weight: 155 lb 3.2 oz (70.4 kg)    GENERAL:alert, no distress and comfortable SKIN: skin color, texture, turgor are normal, Several scattered acne-like rash in her upper front chest.  EYES: normal, conjunctiva are pink and non-injected, sclera clear OROPHARYNX:no exudate, no erythema and lips, buccal mucosa, and tongue normal  NECK: supple, thyroid normal size, non-tender, without nodularity LYMPH:  no palpable  lymphadenopathy in the cervical, axillary or inguinal LUNGS: clear to auscultation and percussion with normal breathing effort HEART: regular rate & rhythm and no murmurs and no lower extremity edema ABDOMEN:abdomen  soft, non-tender and normal bowel sounds Musculoskeletal:no cyanosis of digits and no clubbing  PSYCH: alert & oriented x 3 with fluent speech NEURO: no focal motor/sensory deficits Breasts: Breast inspection showed them to be symmetrical with no skin change or nipple discharge.  Status post right mastectomy and tissue expander placement, surgical scar has healed well, no significant discharge or skin erythema. Palpation of the left breast and bilateral axilla showed no palpable mass.  LABORATORY DATA:  I have reviewed the data as listed CBC Latest Ref Rng & Units 02/21/2016 02/14/2016 01/24/2016  WBC 3.9 - 10.3 10e3/uL 2.1(L) 5.5 6.0  Hemoglobin 11.6 - 15.9 g/dL 8.0(L) 8.3(L) 8.8(L)  Hematocrit 34.8 - 46.6 % 24.0(L) 25.1(L) 26.4(L)  Platelets 145 - 400 10e3/uL 100(L) 90(L) 126(L)    CMP Latest Ref Rng & Units 02/21/2016 02/14/2016 01/24/2016  Glucose 70 - 140 mg/dl 131 120 113  BUN 7.0 - 26.0 mg/dL 13.3 12.3 13.3  Creatinine 0.6 - 1.1 mg/dL 0.9 1.0 1.0  Sodium 136 - 145 mEq/L 141 140 141  Potassium 3.5 - 5.1 mEq/L 3.3(L) 3.4(L) 3.4(L)  Chloride 101 - 111 mmol/L - - -  CO2 22 - 29 mEq/L _0 Calcium 8.4 - 10.4 mg/dL 9.3 9.4 9.5  Total Protein 6.4 - 8.3 g/dL 7.0 7.2 7.1  Total Bilirubin 0.20 - 1.20 mg/dL 0.36 0.30 <0.30  Alkaline Phos 40 - 150 U/L 54 54 58  AST 5 - 34 U/L _1 ALT 0 - 55 U/L _2 PATHOLOGY: Diagnosis 09/29/2015 1. Lymph node, sentinel, biopsy, Right axillary #1 - ONE BENIGN LYMPH NODE WITH NO TUMOR SEEN (0/1). 2. Lymph node, sentinel, biopsy, Right axillary #2 - ONE LYMPH NODE POSITIVE FOR METASTATIC MAMMARY CARCINOMA (1/1). 3. Lymph node, sentinel, biopsy, Right axillary #4 - ONE BENIGN LYMPH NODE WITH NO TUMOR SEEN (0/1). 4. Lymph  node, sentinel, biopsy, Right axillary #5 - ONE LYMPH NODE POSITIVE FOR METASTATIC MAMMARY CARCINOMA (1/1). 5. 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 - LARGEST FOCUS OF INVASIVE MAMMARY CARCINOMA IS GRADE 2, SHOWS ASSOCIATED LOBULAR CARCINOMA IN SITU AND MEASURES 13 CM IN GREATEST DIMENSION. - SECOND LARGEST FOCUS SHOWS INVASIVE GRADE 3 MAMMARY CARCINOMA AND SPANS 2.1 CM IN GREATEST DIMENSION. - THIRD LARGEST FOCUS SHOWS INVASIVE GRADE 2 MAMMARY CARCINOMA WITH LOBULAR CARCINOMA IN SITU AND MEASURES 1.9 CM IN GREATEST DIMENSION. - SMALLEST FOCUS SHOWS INVASIVE GRADE 3 MAMMARY CARCINOMA AND MEASURES 0.5 CM IN GREATEST DIMENSION. - ANTERIOR INFERIOR SOFT TISSUE MARGIN DEMONSTRATES BROAD POSITIVITY FOR INVASIVE MAMMARY CARCINOMA. - OTHER MARGINS ARE NEGATIVE. - SEE ONCOLOGY TEMPLATE. 6. Lymph node, sentinel, biopsy, Right axillary #3 - ONE BENIGN LYMPH NODE WITH NO TUMOR SEEN (0/1). 7. Lymph node, sentinel, biopsy, Right axillary #6 - ONE LYMPH NODE WITH ISOLATED TUMOR CELLS. 8. Lymph nodes, regional resection, Right axillary contents - ELEVEN BENIGN LYMPH NODES WITH NO TUMOR SEEN (0/11). Specimen, including laterality and lymph node sampling (sentinel, non-sentinel): Right breast with right sentinel and non-sentinel lymph nodes. Procedure: Right modified mastectomy with sentinel lymph node biopsy and axillary dissection. Histologic type: Multifocal mammary carcinoma with ductal and lobular features. Largest focus (13 cm): Grade: 2. Tubule formation: 3. Nuclear pleomorphism: 2. Mitotic: 1. Second largest focus (2.1 cm): Grade: 3. Tubule formation: 3. Nuclear pleomorphism: 2. Mitotic: 3. Third largest focus (1.9 cm): Grade: 2. Tubule formation: 3. Nuclear pleomorphism: 2. Mitotic: 1. Smallest focus (0.5 cm):  Grade: 3. Tubule formation: 3. Nuclear pleomorphism: 2. Mitotic: 3. Tumor  size (gross measurements): 13 cm, 2.1 cm, 1.9 cm and 0.5 cm. Margins: Invasive, distance to closest margin: Invasive mammary carcinoma broadly involves the anterior inferior soft tissue margin. In-situ, distance to closest margin: Margin uninvolved by in situ carcinoma. If margin positive, focally or broadly: Broadly positive. Lymphovascular invasion: Yes, identified. Ductal carcinoma in situ: Not identified. Grade: Not applicable. Extensive intraductal component: Not applicable. Lobular neoplasia: Yes, lobular carcinoma in situ is present. Tumor focality: Multifocal. Treatment effect: Not applicable. If present, treatment effect in breast tissue, lymph nodes or both: Not applicable. Extent of tumor: Skin: Not involved. Nipple: Not involved. Skeletal muscle: Not received. Lymph nodes: Examined: 6 Sentinel. 11 Non-sentinel. 17 Total. Lymph nodes with metastasis: 2 with metastasis and 1 with isolated tumor cells. Isolated tumor cells (< 0.2 mm): 1. Micrometastasis: (> 0.2 mm and < 2.0 mm): 0. Macrometastasis: (> 2.0 mm): 2. Extracapsular extension: Not identified.  Breast prognostic profile: Performed on previous case, [id number]. Right 8 o'clock biopsy 12 cm from the nipple: Estrogen receptor: 95%, positive. Progesterone receptor: 95%, positive. Her-2 neu: 2.04 ratio, positive. Ki-67: 30%. Right breast needle core biopsy 6:30 o'clock position 1 cm from the nipple: Estrogen receptor: 90%, positive. Progesterone receptor: 95%, positive. Her-2 neu: 1.39 ratio, negative. Ki-67: 10%. Non-neoplastic breast: No significant non-neoplastic breast findings.  TNM: pT3(m), pN1a. Comments: A cytokeratin AE1/AE3 immunohistochemical stain is performed on eight blocks containing lymph node tissue (eight stains total). The morphology coupled with the staining pattern is consistent with the above findings. An E-cadherin immunohistochemical stain is also performed on four blocks from the  right simple mastectomy specimen. The staining pattern coupled with the morphology is consistent with the above findings. As there was Her-2 neu positivity in one of the biopsies and both biopsies were positive for estrogen receptor, a repeat breast prognostic profile will not be performed unless otherwise requested. Dr. Lyndon Code has seen selected slides 5E, 75F, 5G, 5J, 5N, 5O, and 5P with agreement that the tumor is a mulitfocal invasive mammary carcinoma with a mixed ductal and lobular phenotype. Dr. Tresa Moore has also seen a selected slide (5P) with agreement of the positive anterior inferior margin. (RH:ecj 10/03/2015)   RADIOGRAPHIC STUDIES: I have personally reviewed the radiological images as listed and agreed with the findings in the report. No new scans  Bone scan 10/28/2015 IMPRESSION: Small focus of increased radiotracer uptake within the right lateral skull at the region of the right temporal bone. CT of the head may be considered for further evaluation.  Otherwise no evidence of osseous metastatic disease in the axial skeleton.  CT chest, abdomen and pelvis  10/27/2015 IMPRESSION: Status post right mastectomy and right axillary lymph node dissection.  No findings specific for metastatic disease.  Scattered hepatic cysts measuring up to 12 mm. A single 8 mm hypoenhancing lesion is technically indeterminate, but also likely benign, although motion degraded. Consider attention on follow-up.  CT head with and without contrast 11/07/2015 IMPRESSION: 1. Indeterminate 10-11 mm area of heterogeneous bone mineralization along the right sphenoid wing which may correspond to the recent bone scan finding. This might be physiologic. A Repeat CT head (e.g. in 8-12 weeks, Noncontrast should suffice but with and without contrast could be done if repeat brain staging is desired) may be most valuable to evaluate stability. 2. No CT evidence of metastatic disease to the brain.  Mild nonspecific white matter changes.   ASSESSMENT & PLAN:  40 year old African-American female,  premenopausal, presented with a palpable right breast mass.  1. 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 Her initial biopsy showed 1 triple positive disease, the other was ER/PR positive and HER2 Negative disease. -Given her stage III disease, I recommend staging CT chest, abdomen and pelvis with contrast, and bone scan to ruled out distant metastasis. -We discussed that HER-2 positive breast cancer are more aggressive, with a high risk for recurrence after surgery, especially in the setting of locally advanced stage. I recommend her to have adjuvant chemotherapy TCHP (docetaxel, carboplatin, Herceptin, pejeta) every 3 weeks for total of 6 cycles, followed by Herceptin maintenance therapy to complete 1 year treatment. -Her baseline echo was normal, which was done in April 2017, she will follow-up with cardiologist to monitor her heart during her and her to therapy -I discussed her staging CT and bone scan results, showed no definitive evidence of metastasis. This mild uptake in the right lateral skull on bone scan, and I got a CT head for further evaluation, which showed a small area of heterogeneous bone mineralization, corresponding to the bone scan findings. This is likely physiological. -She had severe back pain and fatigue on day 6 after chemo, probably related to her Neulasta injection, I'll hold it for now. -She developed a prolonged nausea after chemotherapy, responded well to dexamethasone. I instructed her to use dexamethasone 4 mg daily for 3-5 days after chemotherapy, starting day  3 -Lab reviewed, adequate for treatment, will proceed to cycle 6 chemotherapy today. Due to her thrombocytopenia, I'll reduce her chemotherapy dose.  -Due to the large size of tumor and node positive disease, she would need postmastectomy chest wall and axilla radiation. She has seen Dr. Sondra Come   2. Iron deficient anemia, and chemo induced anemia  -Her lab tests showed a moderate anemia with hemoglobin 7.8, low MCV 62.7, her serum iron level and transferrin saturation on low, ferritin 25, this is consistent with iron deficient anemia, likely secondary to her menorrhagia. -Her anemia has resolved after IV Feraheme, slightly worse after she started chemotherapy. -I encouraged her to continue oral iron pill -Her repeated on level and a ferritin have normalized last month.  -Consider blood transfusion if hemoglobin less than 8, or symptomatic anemia  3. Genetic -Given her young age and positive family history, we strongly recommend her to [facility] genetic counseling for genetic testing to ruled out inheritable breast cancer syndrome. She agrees. -Her genetic testing was normal  4.  HTN -She was noticed to have significant elevated blood pressure lately  -She has seen her primary care physician, and her blood pressure medication has been adjusted, including HCTZ -We discussed the impact of chemotherapy on her blood pressure. If she has diarrhea, or poor by mouth intake, she may need to hold her HCTZ  5. Right arm lymphedema  -Secondary to breast surgery and lymph node biopsy -I encouraged her to contact her physical therapist for device of exercise and sleeve use   6. Nausea and diarrhea -Secondary to chemotherapy -She will continue use Zofran, Compazine as needed, and dexamethasone 3-5 days after chemotherapy -She knows to use Imodium as needed for diarrhea -Overall her symptoms are manageable  Plan -Lab results reviewed with her, we'll proceed to cycle  6 chemotherapy today, dose reduction due  to thrombocytopenia -She will return in one week for lab and flu shot -Return to clinic in 3 weeks to start Herceptin maintenance therapy.  All questions were answered. The patient and her family members agree with the above plan. The patient knows to call the clinic with any problems, questions or concerns.  I spent 20 minutes counseling the patient face to face. The total time spent in the appointment was 25 minutes and more than 50% was on counseling.     Truitt Merle, MD 02/22/2016 9:16 AM

## 2016-02-14 NOTE — Patient Instructions (Signed)
[facility] Discharge Instructions for Patients Receiving Chemotherapy  Today you received the following chemotherapy agents herceptin/perjeta/taxotere/carboplatin   To help prevent nausea and vomiting after your treatment, we encourage you to take your nausea medication as directed  If you develop nausea and vomiting that is not controlled by your nausea medication, call the clinic.   BELOW ARE SYMPTOMS THAT SHOULD BE REPORTED IMMEDIATELY:  *FEVER GREATER THAN 100.5 F  *CHILLS WITH OR WITHOUT FEVER  NAUSEA AND VOMITING THAT IS NOT CONTROLLED WITH YOUR NAUSEA MEDICATION  *UNUSUAL SHORTNESS OF BREATH  *UNUSUAL BRUISING OR BLEEDING  TENDERNESS IN MOUTH AND THROAT WITH OR WITHOUT PRESENCE OF ULCERS  *URINARY PROBLEMS  *BOWEL PROBLEMS  UNUSUAL RASH Items with * indicate a potential emergency and should be followed up as soon as possible.  Feel free to call the clinic you have any questions or concerns. The clinic phone number is [phone].  

## 2016-02-15 ENCOUNTER — Telehealth: Payer: Self-pay | Admitting: Hematology

## 2016-02-15 NOTE — Telephone Encounter (Signed)
Appointments scheduled for patient per 10/24 LOS. Patient given AVS and calendar with future scheduled appointments.

## 2016-02-20 ENCOUNTER — Other Ambulatory Visit: Payer: Self-pay | Admitting: Hematology

## 2016-02-20 ENCOUNTER — Telehealth: Payer: Self-pay

## 2016-02-20 NOTE — Telephone Encounter (Signed)
Faxed disability forms to [facility]

## 2016-02-21 ENCOUNTER — Other Ambulatory Visit (HOSPITAL_BASED_OUTPATIENT_CLINIC_OR_DEPARTMENT_OTHER): Payer: BLUE CROSS/BLUE SHIELD

## 2016-02-21 ENCOUNTER — Ambulatory Visit (HOSPITAL_BASED_OUTPATIENT_CLINIC_OR_DEPARTMENT_OTHER): Payer: BLUE CROSS/BLUE SHIELD

## 2016-02-21 VITALS — BP 139/78 | HR 98 | Temp 97.7°F | Resp 20

## 2016-02-21 DIAGNOSIS — C50511 Malignant neoplasm of lower-outer quadrant of right female breast: Secondary | ICD-10-CM | POA: Diagnosis not present

## 2016-02-21 DIAGNOSIS — Z23 Encounter for immunization: Secondary | ICD-10-CM

## 2016-02-21 LAB — CBC WITH DIFFERENTIAL/PLATELET
BASO%: 0.2 % (ref 0.0–2.0)
Basophils Absolute: 0 10*3/uL (ref 0.0–0.1)
EOS%: 0.1 % (ref 0.0–7.0)
Eosinophils Absolute: 0 10*3/uL (ref 0.0–0.5)
HCT: 24 % — ABNORMAL LOW (ref 34.8–46.6)
HGB: 8 g/dL — ABNORMAL LOW (ref 11.6–15.9)
LYMPH%: 88 % — ABNORMAL HIGH (ref 14.0–49.7)
MCH: 30.7 pg (ref 25.1–34.0)
MCHC: 33.5 g/dL (ref 31.5–36.0)
MCV: 91.8 fL (ref 79.5–101.0)
MONO#: 0 10*3/uL — ABNORMAL LOW (ref 0.1–0.9)
MONO%: 2.4 % (ref 0.0–14.0)
NEUT#: 0.2 10*3/uL — CL (ref 1.5–6.5)
NEUT%: 9.3 % — ABNORMAL LOW (ref 38.4–76.8)
Platelets: 100 10*3/uL — ABNORMAL LOW (ref 145–400)
RBC: 2.61 10*6/uL — ABNORMAL LOW (ref 3.70–5.45)
RDW: 16.6 % — ABNORMAL HIGH (ref 11.2–14.5)
WBC: 2.1 10*3/uL — ABNORMAL LOW (ref 3.9–10.3)
lymph#: 1.8 10*3/uL (ref 0.9–3.3)

## 2016-02-21 LAB — COMPREHENSIVE METABOLIC PANEL
ALT: 15 U/L (ref 0–55)
AST: 24 U/L (ref 5–34)
Albumin: 3.4 g/dL — ABNORMAL LOW (ref 3.5–5.0)
Alkaline Phosphatase: 54 U/L (ref 40–150)
Anion Gap: 10 mEq/L (ref 3–11)
BUN: 13.3 mg/dL (ref 7.0–26.0)
CO2: 27 mEq/L (ref 22–29)
Calcium: 9.3 mg/dL (ref 8.4–10.4)
Chloride: 104 mEq/L (ref 98–109)
Creatinine: 0.9 mg/dL (ref 0.6–1.1)
EGFR: >90 mL/min/{1.73_m2} (ref >90)
Glucose: 131 mg/dl (ref 70–140)
Potassium: 3.3 mEq/L — ABNORMAL LOW (ref 3.5–5.1)
Sodium: 141 mEq/L (ref 136–145)
Total Bilirubin: 0.36 mg/dL (ref 0.20–1.20)
Total Protein: 7 g/dL (ref 6.4–8.3)

## 2016-02-21 MED ORDER — INFLUENZA VAC SPLIT QUAD 0.5 ML IM SUSY
0.5000 mL | PREFILLED_SYRINGE | Freq: Once | INTRAMUSCULAR | Status: AC
  Administered 2016-02-21: 0.5 mL via INTRAMUSCULAR
  Filled 2016-02-21: qty 0.5

## 2016-02-21 NOTE — Patient Instructions (Signed)

## 2016-02-22 ENCOUNTER — Encounter: Payer: Self-pay | Admitting: Hematology

## 2016-02-24 ENCOUNTER — Encounter: Payer: Self-pay | Admitting: *Deleted

## 2016-02-24 NOTE — Progress Notes (Signed)
[place] Psychosocial Distress Screening Clinical Social Work  Clinical Social Work was referred by distress screening protocol.  The patient scored a 7 on the Psychosocial Distress Thermometer which indicates severe distress. Clinical Social Worker phoned pt to assess for distress and other psychosocial needs. CSW spoke with pt at length and pt has not received grant funds or other support services to date. CSW had spoken with pt back when she was first diagnosed and educated pt, but she has not been able to access these resources to date. Pt on leave from work until 03/26/16 and described financial hardship due to her income while on STD being much lower. Pt reports she has a young son and making ends meet has been a challenge.  CSW re-educated pt on options for assistance. Pt very interested in support. CSW team to follow up at [facility] and also made referral to financial counselors. CSW team to follow.   ONCBCN DISTRESS SCREENING 01/25/2016  Screening Type Initial Screening  Distress experienced in past week (1-10) 7  Practical problem type Work/school;Childcare  Emotional problem type Nervousness/Anxiety;Adjusting to illness;Adjusting to appearance changes  Information Concerns Type   Physical Problem type Nausea/vomiting;Sleep/insomnia;Loss of appetitie;Swollen arms/legs    Clinical Social Worker follow up needed: Yes.    If yes, follow up plan: See above Loren Racer, [place] Worker [place]  [facility] Phone: [phone] Fax: [phone]

## 2016-02-27 ENCOUNTER — Telehealth: Payer: Self-pay | Admitting: Hematology

## 2016-02-27 NOTE — Telephone Encounter (Signed)
sw pt to confirm appt date/time change per los

## 2016-02-28 ENCOUNTER — Encounter: Payer: Self-pay | Admitting: Radiation Oncology

## 2016-02-28 ENCOUNTER — Encounter: Payer: Self-pay | Admitting: *Deleted

## 2016-02-28 ENCOUNTER — Ambulatory Visit
Admission: RE | Admit: 2016-02-28 | Discharge: 2016-02-28 | Disposition: A | Discharge status: Home / Self Care | Payer: BLUE CROSS/BLUE SHIELD | Source: Ambulatory Visit | Attending: Radiation Oncology | Admitting: Radiation Oncology

## 2016-02-28 DIAGNOSIS — Z510 Encounter for antineoplastic radiation therapy: Secondary | ICD-10-CM | POA: Diagnosis not present

## 2016-02-28 DIAGNOSIS — C50511 Malignant neoplasm of lower-outer quadrant of right female breast: Secondary | ICD-10-CM

## 2016-02-28 DIAGNOSIS — Z170 Estrogen receptor positive status [ER+]: Principal | ICD-10-CM

## 2016-02-28 NOTE — Progress Notes (Signed)
Financial Counselor--Spoke with patient today--she filled out [employer] forms to qualify for gas cards--will bring in her income verification next week, to see if she qualifies for [employer]

## 2016-02-28 NOTE — Progress Notes (Signed)
  Radiation Oncology         [phone] ________________________________  Name: Melanie Moss MRN: [id number]  Date: 02/28/2016  DOB: 24-Dec-1975  SIMULATION AND TREATMENT PLANNING NOTE    ICD-9-CM ICD-10-CM   1. 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  Identity was confirmed.  All relevant records and images related to the planned course of therapy were reviewed.  The patient freely provided informed written consent to proceed with treatment after reviewing the details related to the planned course of therapy. The consent form was witnessed and verified by the simulation staff.  Then, the patient was set-up in a stable reproducible  supine position for radiation therapy.  CT images were obtained.  Surface markings were placed.  The CT images were loaded into the planning software.  Then the target and avoidance structures were contoured.  Treatment planning then occurred.  The radiation prescription was entered and confirmed.  Then, I designed and supervised the construction of a total of 5 medically necessary complex treatment devices.  I have requested : 3D Simulation  I have requested a DVH of the following structures: heart, lungs, esophagus.  I have ordered: dose calc.  PLAN:  The patient will receive 45 Gy in 25 fractions Directed at the right chest wall and local regional area. The patient will then receive a boost to the mastectomy scar region to a cumulative dose of approximately 55-59 gray given the positive surgical margin at the time of her mastectomy and  reconstruction.  -----------------------------------  Blair Promise, PhD, MD  This document serves as a record of services personally performed by [name], MD. It was created on his behalf by Darcus Austin, a trained medical scribe. The creation of this record is based on the scribe's personal observations and the provider's statements to them. This document has been checked and approved by the attending provider.

## 2016-02-28 NOTE — Progress Notes (Signed)
[place] Work  Holiday representative met with patient in [place] office at [facility] to offer support and assess for needs.  Patient was working at the time of her diagnosis.  Since she began treatment she has been unable to work which has caused additional stress.  CSW and patient reviewed financial assistance applications for [facility] in [place].  Patient plans to complete applications, collect requested documentation, and return to CSW at her next appointment.  CSW provided contact information and encouraged patient to call with any questions or concerns.  Johnnye Lana, MSW, LCSW, OSW-C Clinical Social Worker [facility] [phone]

## 2016-03-02 ENCOUNTER — Encounter: Payer: Self-pay | Admitting: Radiation Oncology

## 2016-03-02 NOTE — Progress Notes (Signed)
Paperwork ([employer] [place]) received and faxed to [phone], conf received 11/10, copy given to patient

## 2016-03-03 NOTE — Progress Notes (Signed)
  Radiation Oncology         [phone] ________________________________  Name: Melanie Moss MRN: [id number]  Date: 02/28/2016  DOB: [birth year]  Optical Surface Tracking Plan:  Since intensity modulated radiotherapy (IMRT) and 3D conformal radiation treatment methods are predicated on accurate and precise positioning for treatment, intrafraction motion monitoring is medically necessary to ensure accurate and safe treatment delivery.  The ability to quantify intrafraction motion without excessive ionizing radiation dose can only be performed with optical surface tracking. Accordingly, surface imaging offers the opportunity to obtain 3D measurements of patient position throughout IMRT and 3D treatments without excessive radiation exposure.  I am ordering optical surface tracking for this patient's upcoming course of radiotherapy. ________________________________  [name], MD 03/03/2016 12:26 PM    Reference:   Ursula Alert, J, et al. Surface imaging-based analysis of intrafraction motion for breast radiotherapy patients.Journal of [place], n. 6, nov. 2014. ISSN [id number].   Available at: <http://www.jacmp.org/index.php/jacmp/article/view/4957>.

## 2016-03-05 ENCOUNTER — Other Ambulatory Visit: Payer: Self-pay | Admitting: Hematology

## 2016-03-05 DIAGNOSIS — Z510 Encounter for antineoplastic radiation therapy: Secondary | ICD-10-CM | POA: Diagnosis not present

## 2016-03-05 NOTE — Progress Notes (Signed)
[place]  [phone] [phone]  Clinic Follow Up Note   Patient Care Team: Lucianne Lei, MD as PCP - General (Family Medicine) Excell Seltzer, MD as Consulting Physician (General Surgery) Thea Silversmith, MD as Consulting Physician (Radiation Oncology) 03/06/2016  CHIEF COMPLAINTS:  Follow up right breast cancer  Oncology History   Breast cancer of lower-outer quadrant of right female breast [facility])   Staging form: Breast, AJCC 7th Edition     Clinical stage from 08/03/2015: Stage IA (T1c, N0, M0) - Unsigned     Pathologic stage from 09/29/2015: Stage IIIA (T3(m), N1a, cM0) - Signed by Truitt Merle, MD on 10/17/2015         Breast cancer of lower-outer quadrant of right female breast ([place])   07/25/2015 Mammogram    diagnostic mammogram and ultrasound showed a 11 mm mass in the 8:00 position of the right breast, small irregular lesion in the retroareolar regio of the right breast, contiguous with the dermis      07/26/2015 Initial Diagnosis    Breast cancer of lower-outer quadrant of right female breast ([place])      07/26/2015 Initial Biopsy    right breast needle core biopsy at 8:00 position (1), invasive ductal carcinoma, grade 3, 6:30 o'clock position invasive (2) and in situ ductal carcinoma, grade 1-2, lymphovascular invasion is identified      07/26/2015 Receptors her2    (1) ER 95% positive, PR 95% positive, Ki-67 30%, HER-2 positive with ratio 2.04, copy # 5.20, (2) ER 90% positive, PR 95% positive, Ki-67 10%, HER-2 negative.      08/02/2015 Imaging    breast MRI showed extensive mass  and non-mass enhancement in the lower outer quadrant of the right breast  spanning 13 cm, focal enhancement within the right nipple, at least 3-4 abnormal appearing right axillary lymph node.      09/29/2015 Surgery    right mastectomy and axillar node dissection       09/29/2015 Pathology Results    Right mastectomy and axillary node dissection showed  multifocal mammary carcinoma with ductal and lobular features, tumor size 13 cm, tubal 1 cm, 1.9 cm, 2.5 cm, grade 2-3, anterior margins were positive, 2 nodes with metastasis and one with isolated cells      11/01/2015 -  Chemotherapy    TCHP every 3 weeks, for 6 cycles, followed by herceptin maintenance therapy for a total of one year        HISTORY OF PRESENTING ILLNESS (08/03/2015):  Melanie Moss 40 y.o. female is here because of Her newly diagnosed right breast cancer. She is accompanied by her fianc, sister and mother to our multidisciplinary breast clinic today.  She felt a breast lump and noticed mild intermittent breast pain 2 weeks ago, no nipple discharge or skin change. She also has moderate fatigue and dyspnea on moderate to heavy exertion for a few months, no weight loss, no other pain or other symptoms.  She had normal mammgram 6 years ago, for a small lump which felt to be benign and no biopsy was done.    She has iregular period, twice a month, it lasts about 5-7 days, heavy for 3 days, she chanes 6-7 times a day. She was found to be anemic recently, and has started taking oral iron pill a few weeks ago, tolerates well.  GYN HISTORY  Menarchal: 10  LMP: 07/29/2015 Contraceptive: no  HRT: n/a  G4P1: 3 miscarrage, one 71 yo son, no plan to  have more children   CURRENT THERAPY: Maintenance Herceptin, every 3 weeks, started on 03/06/2016.  INTERIM HISTORY: Melanie Moss returns for follow-up and maintenance Herceptin treatment. She tolerated last cycle chemotherapy well, but has worsening fatigue lately, she had mild nausea, no vomiting, no chest pain or dyspnea. She has been having hot flash since he started chemotherapy, last measured 2. Was 2 months ago. She does not sleep well at night, only sleeps 3-4 hours a night, appetite has been good. Weight is stable. She is scheduled to start adjuvant radiation today.  MEDICAL HISTORY:  Past Medical History:  Diagnosis Date  . Anemia    . Anxiety   . Breast cancer of lower-outer quadrant of right female breast ([place]) 07/29/2015  . Family history of breast cancer   . Family history of colon cancer   . Family history of prostate cancer   . GERD (gastroesophageal reflux disease)    tums   . Gestational diabetes   . Hyperlipidemia   . Hypertension    not currently on medication  . Preterm labor     SURGICAL HISTORY: Past Surgical History:  Procedure Laterality Date  . AXILLARY LYMPH NODE DISSECTION Right 09/29/2015   Procedure: AXILLARY LYMPH NODE DISSECTION;  Surgeon: Excell Seltzer, MD;  Location: [place];  Service: General;  Laterality: Right;  . BREAST RECONSTRUCTION WITH PLACEMENT OF TISSUE EXPANDER AND FLEX HD (ACELLULAR HYDRATED DERMIS) Right 09/29/2015   Procedure: BREAST RECONSTRUCTION WITH PLACEMENT OF TISSUE EXPANDER AND FLEX HD (ACELLULAR HYDRATED DERMIS);  Surgeon: Loel Lofty Dillingham, DO;  Location: [place];  Service: Plastics;  Laterality: Right;  . CERVICAL CERCLAGE  02/19/2011   Procedure: CERCLAGE CERVICAL;  Surgeon: Agnes Lawrence, MD;  Location: [facility];  Service: Gynecology;  Laterality: N/A;  . DILATION AND CURETTAGE OF UTERUS    . MASTECTOMY WITH AXILLARY LYMPH NODE DISSECTION Right 09/29/2015  . PORTACATH PLACEMENT N/A 09/29/2015   Procedure: INSERTION PORT-A-CATH;  Surgeon: Excell Seltzer, MD;  Location: [place];  Service: General;  Laterality: N/A;  . SIMPLE MASTECTOMY WITH AXILLARY SENTINEL NODE BIOPSY Right 09/29/2015   Procedure: RIGHT TOTAL  MASTECTOMY WITH AXILLARY SENTINEL NODE BIOPSY;  Surgeon: Excell Seltzer, MD;  Location: [place];  Service: General;  Laterality: Right;    SOCIAL HISTORY: Social History   Social History  . Marital status: Single    Spouse name: N/A  . Number of children: 1  . Years of education: N/A   Occupational History  . Not on file.   Social History Main Topics  . Smoking status: Never Smoker  . Smokeless tobacco: Never Used  . Alcohol use No  . Drug  use: No  . Sexual activity: Yes   Other Topics Concern  . Not on file   Social History Narrative  . No narrative on file    FAMILY HISTORY: Family History  Problem Relation Age of Onset  . Hypertension Mother   . Hypertension Father   . Prostate cancer Father 61  . Breast cancer Maternal Aunt 40  . Breast cancer Paternal Aunt 68  . Cirrhosis Maternal Uncle   . Prostate cancer Paternal Uncle   . Throat cancer Maternal Aunt     non smoker  . Prostate cancer Paternal Uncle   . Colon cancer Cousin     maternal first cousin dx in his 97s  . Lung cancer Cousin     smoker    ALLERGIES:  has No Known Allergies.  MEDICATIONS:  Current Outpatient Prescriptions  Medication Sig Dispense Refill  . amLODipine (NORVASC) 5 MG tablet Take 5 mg by mouth at bedtime.  0  . B Complex-C (SUPER B COMPLEX PO) Take 1 tablet by mouth daily.    . clindamycin (CLINDAGEL) 1 % gel Apply topically 2 (two) times daily. (Patient not taking: Reported on 02/14/2016) 30 g 1  . dexamethasone (DECADRON) 4 MG tablet Take one tab in am daily x 3-5 days & stop if nausea better 20 tablet 0  . diphenhydrAMINE (BENADRYL) 12.5 MG/5ML elixir Take 5 mLs (12.5 mg total) by mouth every 6 (six) hours as needed for itching. 120 mL 0  . ferrous sulfate 325 (65 FE) MG tablet Take 325 mg by mouth 2 (two) times daily with a meal.    . hydrocortisone 2.5 % cream Apply topically 2 (two) times daily. (Patient not taking: Reported on 02/14/2016) 30 g 1  . ibuprofen (ADVIL,MOTRIN) 200 MG tablet Take 200 mg by mouth every 6 (six) hours as needed.    Marland Kitchen LORazepam (ATIVAN) 0.5 MG tablet Take 1 tablet (0.5 mg total) by mouth at bedtime as needed for anxiety (nausea). 30 tablet 0  . Multiple Vitamin (MULTIVITAMIN) tablet Take 1 tablet by mouth daily.    . ondansetron (ZOFRAN-ODT) 4 MG disintegrating tablet Take 4 mg by mouth every 8 (eight) hours as needed for nausea or vomiting. Reported on 11/01/2015  0  . polyethylene glycol (MIRALAX  / GLYCOLAX) packet Take 17 g by mouth daily as needed for mild constipation. 14 each 0  . Potassium Chloride ER 20 MEQ TBCR Take 20 mEq by mouth daily. 30 tablet 1  . senna (SENOKOT) 8.6 MG TABS tablet Take 1 tablet (8.6 mg total) by mouth 2 (two) times daily. (Patient taking differently: Take 1 tablet by mouth daily as needed. ) 120 each 0  . valsartan-hydrochlorothiazide (DIOVAN-HCT) 160-12.5 MG tablet Take 1 tablet by mouth every morning.  0   No current facility-administered medications for this visit.     REVIEW OF SYSTEMS:   Constitutional: Denies fevers, chills or abnormal night sweats Eyes: Denies blurriness of vision, double vision or watery eyes Ears, nose, mouth, throat, and face: Denies mucositis or sore throat Respiratory: Denies cough, dyspnea or wheezes Cardiovascular: Denies palpitation, chest discomfort or lower extremity swelling Gastrointestinal:  Denies nausea, heartburn or change in bowel habits Skin: Denies abnormal skin rashes Lymphatics: Denies new lymphadenopathy or easy bruising Neurological:Denies numbness, tingling or new weaknesses Behavioral/Psych: Mood is stable, no new changes  All other systems were reviewed with the patient and are negative.  PHYSICAL EXAMINATION: ECOG PERFORMANCE STATUS: 1   Vitals:   03/06/16 1022  BP: (!) 151/87  Pulse: (!) 110  Resp: 17  Temp: 97.9 F (36.6 C)   Filed Weights   03/06/16 1022  Weight: 160 lb 3.2 oz (72.7 kg)    GENERAL:alert, no distress and comfortable SKIN: skin color, texture, turgor are normal, Several scattered acne-like rash in her upper front chest.  EYES: normal, conjunctiva are pink and non-injected, sclera clear OROPHARYNX:no exudate, no erythema and lips, buccal mucosa, and tongue normal  NECK: supple, thyroid normal size, non-tender, without nodularity LYMPH:  no palpable lymphadenopathy in the cervical, axillary or inguinal LUNGS: clear to auscultation and percussion with normal breathing  effort HEART: regular rate & rhythm and no murmurs and no lower extremity edema ABDOMEN:abdomen soft, non-tender and normal bowel sounds Musculoskeletal:no cyanosis of digits and no clubbing  PSYCH: alert & oriented x 3 with fluent speech NEURO:  no focal motor/sensory deficits Breasts: Breast inspection showed them to be symmetrical with no skin change or nipple discharge.  Status post right mastectomy and tissue expander placement, surgical scar has healed well, no significant discharge or skin erythema. Palpation of the left breast and bilateral axilla showed no palpable mass.  LABORATORY DATA:  I have reviewed the data as listed CBC Latest Ref Rng & Units 03/06/2016 02/21/2016 02/14/2016  WBC 3.9 - 10.3 10e3/uL 5.4 2.1(L) 5.5  Hemoglobin 11.6 - 15.9 g/dL 7.1(L) 8.0(L) 8.3(L)  Hematocrit 34.8 - 46.6 % 22.1(L) 24.0(L) 25.1(L)  Platelets 145 - 400 10e3/uL 136(L) 100(L) 90(L)    CMP Latest Ref Rng & Units 03/06/2016 02/21/2016 02/14/2016  Glucose 70 - 140 mg/dl 97 131 120  BUN 7.0 - 26.0 mg/dL 12.3 13.3 12.3  Creatinine 0.6 - 1.1 mg/dL 0.9 0.9 1.0  Sodium 136 - 145 mEq/L 142 141 140  Potassium 3.5 - 5.1 mEq/L 3.4(L) 3.3(L) 3.4(L)  Chloride 101 - 111 mmol/L - - -  CO2 22 - 29 mEq/L '25 27 25  '$ Calcium 8.4 - 10.4 mg/dL 9.7 9.3 9.4  Total Protein 6.4 - 8.3 g/dL 7.2 7.0 7.2  Total Bilirubin 0.20 - 1.20 mg/dL 0.26 0.36 0.30  Alkaline Phos 40 - 150 U/L 58 54 54  AST 5 - 34 U/L '25 24 24  '$ ALT 0 - 55 U/L '14 15 16    '$ PATHOLOGY: Diagnosis 09/29/2015 1. Lymph node, sentinel, biopsy, Right axillary #1 - ONE BENIGN LYMPH NODE WITH NO TUMOR SEEN (0/1). 2. Lymph node, sentinel, biopsy, Right axillary #2 - ONE LYMPH NODE POSITIVE FOR METASTATIC MAMMARY CARCINOMA (1/1). 3. Lymph node, sentinel, biopsy, Right axillary #4 - ONE BENIGN LYMPH NODE WITH NO TUMOR SEEN (0/1). 4. 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 5. Breast,  simple mastectomy, Right breast-suture marks axillary tail - MULTIFOCAL MAMMARY CARCINOMA WITH DUCTAL AND LOBULAR FEATURES. - LARGEST FOCUS OF INVASIVE MAMMARY CARCINOMA IS GRADE 2, SHOWS ASSOCIATED LOBULAR CARCINOMA IN SITU AND MEASURES 13 CM IN GREATEST DIMENSION. - SECOND LARGEST FOCUS SHOWS INVASIVE GRADE 3 MAMMARY CARCINOMA AND SPANS 2.1 CM IN GREATEST DIMENSION. - THIRD LARGEST FOCUS SHOWS INVASIVE GRADE 2 MAMMARY CARCINOMA WITH LOBULAR CARCINOMA IN SITU AND MEASURES 1.9 CM IN GREATEST DIMENSION. - SMALLEST FOCUS SHOWS INVASIVE GRADE 3 MAMMARY CARCINOMA AND MEASURES 0.5 CM IN GREATEST DIMENSION. - ANTERIOR INFERIOR SOFT TISSUE MARGIN DEMONSTRATES BROAD POSITIVITY FOR INVASIVE MAMMARY CARCINOMA. - OTHER MARGINS ARE NEGATIVE. - SEE ONCOLOGY TEMPLATE. 6. Lymph node, sentinel, biopsy, Right axillary #3 - ONE BENIGN LYMPH NODE WITH NO TUMOR SEEN (0/1). 7. Lymph node, sentinel, biopsy, Right axillary #6 - ONE LYMPH NODE WITH ISOLATED TUMOR CELLS. 8. Lymph nodes, regional resection, Right axillary contents - ELEVEN BENIGN LYMPH NODES WITH NO TUMOR SEEN (0/11). Specimen, including laterality and lymph node sampling (sentinel, non-sentinel): Right breast with right sentinel and non-sentinel lymph nodes. Procedure: Right modified mastectomy with sentinel lymph node biopsy and axillary dissection. Histologic type: Multifocal mammary carcinoma with ductal and lobular features. Largest focus (13 cm): Grade: 2. Tubule formation: 3. Nuclear pleomorphism: 2. Mitotic: 1. Second largest focus (2.1 cm): Grade: 3. Tubule formation: 3. Nuclear pleomorphism: 2. Mitotic: 3. Third largest focus (1.9 cm): Grade: 2. Tubule formation: 3. Nuclear pleomorphism: 2. Mitotic: 1. Smallest focus (0.5 cm): Grade: 3. Tubule formation: 3. Nuclear pleomorphism: 2. Mitotic: 3. Tumor size (gross measurements): 13 cm, 2.1 cm, 1.9 cm and 0.5 cm. Margins:  Invasive, distance to closest margin: Invasive  mammary carcinoma broadly involves the anterior inferior soft tissue margin. In-situ, distance to closest margin: Margin uninvolved by in situ carcinoma. If margin positive, focally or broadly: Broadly positive. Lymphovascular invasion: Yes, identified. Ductal carcinoma in situ: Not identified. Grade: Not applicable. Extensive intraductal component: Not applicable. Lobular neoplasia: Yes, lobular carcinoma in situ is present. Tumor focality: Multifocal. Treatment effect: Not applicable. If present, treatment effect in breast tissue, lymph nodes or both: Not applicable. Extent of tumor: Skin: Not involved. Nipple: Not involved. Skeletal muscle: Not received. Lymph nodes: Examined: 6 Sentinel. 11 Non-sentinel. 17 Total. Lymph nodes with metastasis: 2 with metastasis and 1 with isolated tumor cells. Isolated tumor cells (< 0.2 mm): 1. Micrometastasis: (> 0.2 mm and < 2.0 mm): 0. Macrometastasis: (> 2.0 mm): 2. Extracapsular extension: Not identified.  Breast prognostic profile: Performed on previous case, [id number]. Right 8 o'clock biopsy 12 cm from the nipple: Estrogen receptor: 95%, positive. Progesterone receptor: 95%, positive. Her-2 neu: 2.04 ratio, positive. Ki-67: 30%. Right breast needle core biopsy 6:30 o'clock position 1 cm from the nipple: Estrogen receptor: 90%, positive. Progesterone receptor: 95%, positive. Her-2 neu: 1.39 ratio, negative. Ki-67: 10%. Non-neoplastic breast: No significant non-neoplastic breast findings.  TNM: pT3(m), pN1a. Comments: A cytokeratin AE1/AE3 immunohistochemical stain is performed on eight blocks containing lymph node tissue (eight stains total). The morphology coupled with the staining pattern is consistent with the above findings. An E-cadherin immunohistochemical stain is also performed on four blocks from the right simple mastectomy specimen. The staining pattern coupled with the morphology is consistent with the above  findings. As there was Her-2 neu positivity in one of the biopsies and both biopsies were positive for estrogen receptor, a repeat breast prognostic profile will not be performed unless otherwise requested. Dr. Lyndon Code has seen selected slides 5E, 91F, 5G, 5J, 5N, 5O, and 5P with agreement that the tumor is a mulitfocal invasive mammary carcinoma with a mixed ductal and lobular phenotype. Dr. Tresa Moore has also seen a selected slide (5P) with agreement of the positive anterior inferior margin. (RH:ecj 10/03/2015)   RADIOGRAPHIC STUDIES: I have personally reviewed the radiological images as listed and agreed with the findings in the report. No new scans  Bone scan 10/28/2015 IMPRESSION: Small focus of increased radiotracer uptake within the right lateral skull at the region of the right temporal bone. CT of the head may be considered for further evaluation.  Otherwise no evidence of osseous metastatic disease in the axial skeleton.  CT chest, abdomen and pelvis  10/27/2015 IMPRESSION: Status post right mastectomy and right axillary lymph node dissection.  No findings specific for metastatic disease.  Scattered hepatic cysts measuring up to 12 mm. A single 8 mm hypoenhancing lesion is technically indeterminate, but also likely benign, although motion degraded. Consider attention on follow-up.  CT head with and without contrast 11/07/2015 IMPRESSION: 1. Indeterminate 10-11 mm area of heterogeneous bone mineralization along the right sphenoid wing which may correspond to the recent bone scan finding. This might be physiologic. A Repeat CT head (e.g. in 8-12 weeks, Noncontrast should suffice but with and without contrast could be done if repeat brain staging is desired) may be most valuable to evaluate stability. 2. No CT evidence of metastatic disease to the brain. Mild nonspecific white matter changes.   ASSESSMENT & PLAN:  40 year old African-American female, premenopausal,  presented with a palpable right breast mass.  1. 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 Her initial biopsy showed 1 triple positive disease, the other was ER/PR positive and HER2 Negative disease. -Given her stage III disease, I recommend staging CT chest, abdomen and pelvis with contrast, and bone scan to ruled out distant metastasis. -We discussed that HER-2 positive breast cancer are more aggressive, with a high risk for recurrence after surgery, especially in the setting of locally advanced stage. I recommend her to have adjuvant chemotherapy TCHP (docetaxel, carboplatin, Herceptin, pejeta) every 3 weeks for total of 6 cycles, followed by Herceptin maintenance therapy to complete 1 year treatment. -Her baseline echo was normal, which was done in April 2017, she will follow-up with cardiologist to monitor her heart during her and her to therapy -I discussed her staging CT and bone scan results, showed no definitive evidence of metastasis. This mild uptake in the right lateral skull on bone scan, and I got a CT head for further evaluation, which showed a small area of heterogeneous bone mineralization, corresponding to the bone scan findings. This is likely physiological. -He has completed adjuvant chemotherapy, we'll start the maintenance Herceptin today, and continue every 3 weeks until the end of June 2018. -She is due for echocardiogram, she will call her cardiologist office for appointment today -She is starting adjuvant breast and axilla radiation today -Plan to start adjuvant endocrine therapy after she completes radiation.  2. Iron deficient anemia, and chemo induced anemia  -Her lab  tests showed a moderate anemia with hemoglobin 7.8, low MCV 62.7, her serum iron level and transferrin saturation on low, ferritin 25, this is consistent with iron deficient anemia, likely secondary to her menorrhagia. -Her anemia has resolved after IV Feraheme, slightly worse after she started chemotherapy. -I encouraged her to continue oral iron pill -Her repeated on level and a ferritin have normalized last month.  -Her hemoglobin dropped to 7.1 today, likely secondary to chemotherapy, she is symptomatically, I'll arrange blood transfusion in the next few days  3. Genetic -Given her young age and positive family history, we strongly recommend her to [facility] genetic counseling for genetic testing to ruled out inheritable breast cancer syndrome. She agrees. -Her genetic testing was normal  4.  HTN -She was noticed to have significant elevated blood pressure lately  -She has seen her primary care physician, and her blood pressure medication has been adjusted, including HCTZ  5. Right arm lymphedema  -Secondary to breast surgery and lymph node biopsy -I encouraged her to contact her physical therapist for device of exercise and sleeve use    Plan -Lab results reviewed with her, we'll proceed first cycle Herceptin maintenance today, and continue every 3 weeks -She will call Dr. Kathlen Brunswick office for follow-up and echo appointments to be done in the next 3 weeks -2 units RBC transfusion in the next 2-3 days -She is starting adjuvant radiation today -I will see her back in 6 weeks before Herceptin treatment  All questions were answered. The patient and her family members agree with the above plan. The patient knows to call the clinic with any problems, questions or concerns.  I spent 20 minutes counseling the patient face to face. The total time spent in the appointment was 25 minutes and more than 50% was on counseling.     Malachy Mood, MD 03/06/2016  10:52 AM

## 2016-03-06 ENCOUNTER — Other Ambulatory Visit (HOSPITAL_BASED_OUTPATIENT_CLINIC_OR_DEPARTMENT_OTHER): Payer: BLUE CROSS/BLUE SHIELD

## 2016-03-06 ENCOUNTER — Ambulatory Visit: Payer: BLUE CROSS/BLUE SHIELD

## 2016-03-06 ENCOUNTER — Ambulatory Visit (HOSPITAL_COMMUNITY)
Admission: RE | Admit: 2016-03-06 | Discharge: 2016-03-06 | Disposition: A | Discharge status: Home / Self Care | Payer: BLUE CROSS/BLUE SHIELD | Source: Ambulatory Visit | Attending: Hematology | Admitting: Hematology

## 2016-03-06 ENCOUNTER — Encounter: Payer: Self-pay | Admitting: Hematology

## 2016-03-06 ENCOUNTER — Ambulatory Visit: Payer: BLUE CROSS/BLUE SHIELD | Admitting: Radiation Oncology

## 2016-03-06 ENCOUNTER — Other Ambulatory Visit: Payer: Self-pay | Admitting: *Deleted

## 2016-03-06 ENCOUNTER — Ambulatory Visit (HOSPITAL_BASED_OUTPATIENT_CLINIC_OR_DEPARTMENT_OTHER): Payer: BLUE CROSS/BLUE SHIELD | Admitting: Hematology

## 2016-03-06 ENCOUNTER — Ambulatory Visit (HOSPITAL_BASED_OUTPATIENT_CLINIC_OR_DEPARTMENT_OTHER): Payer: BLUE CROSS/BLUE SHIELD

## 2016-03-06 VITALS — BP 144/100 | HR 93 | Temp 97.8°F | Resp 18

## 2016-03-06 VITALS — BP 151/87 | HR 110 | Temp 97.9°F | Resp 17 | Ht 63.0 in | Wt 160.2 lb

## 2016-03-06 DIAGNOSIS — Z170 Estrogen receptor positive status [ER+]: Secondary | ICD-10-CM

## 2016-03-06 DIAGNOSIS — C50511 Malignant neoplasm of lower-outer quadrant of right female breast: Secondary | ICD-10-CM

## 2016-03-06 DIAGNOSIS — D509 Iron deficiency anemia, unspecified: Secondary | ICD-10-CM

## 2016-03-06 DIAGNOSIS — I10 Essential (primary) hypertension: Secondary | ICD-10-CM

## 2016-03-06 DIAGNOSIS — D6481 Anemia due to antineoplastic chemotherapy: Secondary | ICD-10-CM

## 2016-03-06 DIAGNOSIS — D630 Anemia in neoplastic disease: Secondary | ICD-10-CM

## 2016-03-06 DIAGNOSIS — Z5112 Encounter for antineoplastic immunotherapy: Secondary | ICD-10-CM

## 2016-03-06 DIAGNOSIS — D500 Iron deficiency anemia secondary to blood loss (chronic): Secondary | ICD-10-CM

## 2016-03-06 LAB — COMPREHENSIVE METABOLIC PANEL
ALT: 14 U/L (ref 0–55)
AST: 25 U/L (ref 5–34)
Albumin: 3.3 g/dL — ABNORMAL LOW (ref 3.5–5.0)
Alkaline Phosphatase: 58 U/L (ref 40–150)
Anion Gap: 12 mEq/L — ABNORMAL HIGH (ref 3–11)
BUN: 12.3 mg/dL (ref 7.0–26.0)
CO2: 25 mEq/L (ref 22–29)
Calcium: 9.7 mg/dL (ref 8.4–10.4)
Chloride: 105 mEq/L (ref 98–109)
Creatinine: 0.9 mg/dL (ref 0.6–1.1)
EGFR: 89 mL/min/{1.73_m2} — ABNORMAL LOW (ref >90)
Glucose: 97 mg/dl (ref 70–140)
Potassium: 3.4 mEq/L — ABNORMAL LOW (ref 3.5–5.1)
Sodium: 142 mEq/L (ref 136–145)
Total Bilirubin: 0.26 mg/dL (ref 0.20–1.20)
Total Protein: 7.2 g/dL (ref 6.4–8.3)

## 2016-03-06 LAB — CBC WITH DIFFERENTIAL/PLATELET
BASO%: 0 % (ref 0.0–2.0)
Basophils Absolute: 0 10*3/uL (ref 0.0–0.1)
EOS%: 0 % (ref 0.0–7.0)
Eosinophils Absolute: 0 10*3/uL (ref 0.0–0.5)
HCT: 22.1 % — ABNORMAL LOW (ref 34.8–46.6)
HGB: 7.1 g/dL — ABNORMAL LOW (ref 11.6–15.9)
LYMPH%: 34.1 % (ref 14.0–49.7)
MCH: 30.9 pg (ref 25.1–34.0)
MCHC: 32.1 g/dL (ref 31.5–36.0)
MCV: 96.1 fL (ref 79.5–101.0)
MONO#: 0.5 10*3/uL (ref 0.1–0.9)
MONO%: 9.5 % (ref 0.0–14.0)
NEUT#: 3 10*3/uL (ref 1.5–6.5)
NEUT%: 56.4 % (ref 38.4–76.8)
Platelets: 136 10*3/uL — ABNORMAL LOW (ref 145–400)
RBC: 2.3 10*6/uL — ABNORMAL LOW (ref 3.70–5.45)
RDW: 17.2 % — ABNORMAL HIGH (ref 11.2–14.5)
WBC: 5.4 10*3/uL (ref 3.9–10.3)
lymph#: 1.8 10*3/uL (ref 0.9–3.3)

## 2016-03-06 LAB — IRON AND TIBC
%SAT: 29 % (ref 21–57)
Iron: 72 ug/dL (ref 41–142)
TIBC: 251 ug/dL (ref 236–444)
UIBC: 179 ug/dL (ref 120–384)

## 2016-03-06 LAB — FERRITIN: Ferritin: 275 ng/ml — ABNORMAL HIGH (ref 9–269)

## 2016-03-06 LAB — PREPARE RBC (CROSSMATCH)

## 2016-03-06 LAB — ABO/RH: ABO/RH(D): O POS

## 2016-03-06 MED ORDER — TRASTUZUMAB CHEMO 150 MG IV SOLR
6.0000 mg/kg | Freq: Once | INTRAVENOUS | Status: AC
  Administered 2016-03-06: 420 mg via INTRAVENOUS
  Filled 2016-03-06: qty 20

## 2016-03-06 MED ORDER — SODIUM CHLORIDE 0.9 % IV SOLN
Freq: Once | INTRAVENOUS | Status: AC
  Administered 2016-03-06: 12:00:00 via INTRAVENOUS

## 2016-03-06 MED ORDER — HEPARIN SOD (PORK) LOCK FLUSH 100 UNIT/ML IV SOLN
500.0000 [IU] | Freq: Once | INTRAVENOUS | Status: AC | PRN
  Administered 2016-03-06: 500 [IU]
  Filled 2016-03-06: qty 5

## 2016-03-06 MED ORDER — ACETAMINOPHEN 325 MG PO TABS
ORAL_TABLET | ORAL | Status: AC
  Filled 2016-03-06: qty 2

## 2016-03-06 MED ORDER — SODIUM CHLORIDE 0.9% FLUSH
10.0000 mL | INTRAVENOUS | Status: DC | PRN
  Administered 2016-03-06: 10 mL
  Filled 2016-03-06: qty 10

## 2016-03-06 MED ORDER — DIPHENHYDRAMINE HCL 25 MG PO CAPS
ORAL_CAPSULE | ORAL | Status: AC
  Filled 2016-03-06: qty 2

## 2016-03-06 MED ORDER — DIPHENHYDRAMINE HCL 25 MG PO CAPS
50.0000 mg | ORAL_CAPSULE | Freq: Once | ORAL | Status: AC
  Administered 2016-03-06: 50 mg via ORAL

## 2016-03-06 MED ORDER — ACETAMINOPHEN 325 MG PO TABS
650.0000 mg | ORAL_TABLET | Freq: Once | ORAL | Status: AC
  Administered 2016-03-06: 650 mg via ORAL

## 2016-03-06 MED ORDER — SODIUM CHLORIDE 0.9 % IJ SOLN
10.0000 mL | INTRAMUSCULAR | Status: DC | PRN
Start: 2016-03-06 — End: 2016-03-06
  Administered 2016-03-06: 10 mL via INTRAVENOUS
  Filled 2016-03-06: qty 10

## 2016-03-06 NOTE — Patient Instructions (Signed)
[facility] Discharge Instructions for Patients Receiving Chemotherapy  Today you received the following chemotherapy agents: Herceptin. To help prevent nausea and vomiting after your treatment, we encourage you to take your nausea medication as prescribed. Blood Transfusion , Adult A blood transfusion is a procedure in which you receive donated blood, including plasma, platelets, and red blood cells, through an IV tube. You may need a blood transfusion because of illness, surgery, or injury. The blood may come from a donor. You may also be able to donate blood for yourself (autologous blood donation) before a surgery if you know that you might require a blood transfusion. The blood given in a transfusion is made up of different types of cells. You may receive:  Red blood cells. These carry oxygen to the cells in the body.  White blood cells. These help you fight infections.  Platelets. These help your blood to clot.  Plasma. This is the liquid part of your blood and it helps with fluid imbalances. If you have hemophilia or another clotting disorder, you may also receive other types of blood products. Tell a health care provider about:  Any allergies you have.  All medicines you are taking, including vitamins, herbs, eye drops, creams, and over-the-counter medicines.  Any problems you or family members have had with anesthetic medicines.  Any blood disorders you have.  Any surgeries you have had.  Any medical conditions you have, including any recent fever or cold symptoms.  Whether you are pregnant or may be pregnant.  Any previous reactions you have had during a blood transfusion. What are the risks? Generally, this is a safe procedure. However, problems may occur, including:  Having an allergic reaction to something in the donated blood. Hives and itching may be symptoms of this type of reaction.  Fever. This may be a reaction to the white blood cells in the  transfused blood. Nausea or chest pain may accompany a fever.  Iron overload. This can happen from having many transfusions.  Transfusion-related acute lung injury (TRALI). This is a rare reaction that causes lung damage. The cause is not known.TRALI can occur within hours of a transfusion or several days later.  Sudden (acute) or delayed hemolytic reactions. This happens if your blood does not match the cells in your transfusion. Your body's defense system (immune system) may try to attack the new cells. This complication is rare. The symptoms include fever, chills, nausea, and low back pain or chest pain.  Infection or disease transmission. This is rare. What happens before the procedure?  You will have a blood test to determine your blood type. This is necessary to know what kind of blood your body will accept and to match it to the donor blood.  If you are going to have a planned surgery, you may be able to do an autologous blood donation. This may be done in case you need to have a transfusion.  If you have had an allergic reaction to a transfusion in the past, you may be given medicine to help prevent a reaction. This medicine may be given to you by mouth or through an IV tube.  You will have your temperature, blood pressure, and pulse monitored before the transfusion.  Follow instructions from your health care provider about eating and drinking restrictions.  Ask your health care provider about:  Changing or stopping your regular medicines. This is especially important if you are taking diabetes medicines or blood thinners.  Taking medicines such  as aspirin and ibuprofen. These medicines can thin your blood. Do not take these medicines before your procedure if your health care provider instructs you not to. What happens during the procedure?  An IV tube will be inserted into one of your veins.  The bag of donated blood will be attached to your IV tube. The blood will then enter  through your vein.  Your temperature, blood pressure, and pulse will be monitored regularly during the transfusion. This monitoring is done to detect early signs of a transfusion reaction.  If you have any signs or symptoms of a reaction, your transfusion will be stopped and you may be given medicine.  When the transfusion is complete, your IV tube will be removed.  Pressure may be applied to the IV site for a few minutes.  A bandage (dressing) will be applied. The procedure may vary among health care providers and hospitals. What happens after the procedure?  Your temperature, blood pressure, heart rate, breathing rate, and blood oxygen level will be monitored often.  Your blood may be tested to see how you are responding to the transfusion.  You may be warmed with fluids or blankets to maintain a normal body temperature. Summary  A blood transfusion is a procedure in which you receive donated blood, including plasma, platelets, and red blood cells, through an IV tube.  Your temperature, blood pressure, and pulse will be monitored before, during, and after the transfusion.  Your blood may be tested after the transfusion to see how your body has responded. This information is not intended to replace advice given to you by your health care provider. Make sure you discuss any questions you have with your health care provider. Document Released: 04/06/2000 Document Revised: 01/05/2016 Document Reviewed: 01/05/2016 Elsevier Interactive Patient Education  2017 [employer].  If you develop nausea and vomiting that is not controlled by your nausea medication, call the clinic.   BELOW ARE SYMPTOMS THAT SHOULD BE REPORTED IMMEDIATELY:  *FEVER GREATER THAN 100.5 F  *CHILLS WITH OR WITHOUT FEVER  NAUSEA AND VOMITING THAT IS NOT CONTROLLED WITH YOUR NAUSEA MEDICATION  *UNUSUAL SHORTNESS OF BREATH  *UNUSUAL BRUISING OR BLEEDING  TENDERNESS IN MOUTH AND THROAT WITH OR WITHOUT PRESENCE  OF ULCERS  *URINARY PROBLEMS  *BOWEL PROBLEMS  UNUSUAL RASH Items with * indicate a potential emergency and should be followed up as soon as possible.  Feel free to call the clinic you have any questions or concerns. The clinic phone number is [phone].  Please show the [place] at check-in to the Emergency Department and triage nurse.

## 2016-03-06 NOTE — Progress Notes (Signed)
B/p 144/100, Pt reports that she did not take her b/p medication today. Selena Lesser NP aware, okay to discharge home, pt to take home mediation when she gets home, monitor b/p and report to ER if she develops any symptoms of high B/P complications. Pt aware and verbalizes understanding. Pt stable at discharge.

## 2016-03-07 ENCOUNTER — Ambulatory Visit
Admission: RE | Admit: 2016-03-07 | Discharge: 2016-03-07 | Disposition: A | Discharge status: Home / Self Care | Payer: BLUE CROSS/BLUE SHIELD | Source: Ambulatory Visit | Attending: Radiation Oncology | Admitting: Radiation Oncology

## 2016-03-07 DIAGNOSIS — Z510 Encounter for antineoplastic radiation therapy: Secondary | ICD-10-CM | POA: Diagnosis not present

## 2016-03-07 DIAGNOSIS — Z170 Estrogen receptor positive status [ER+]: Principal | ICD-10-CM

## 2016-03-07 DIAGNOSIS — C50511 Malignant neoplasm of lower-outer quadrant of right female breast: Secondary | ICD-10-CM

## 2016-03-07 LAB — TYPE AND SCREEN
ABO/RH(D): O POS
Antibody Screen: NEGATIVE
Unit division: 0
Unit division: 0

## 2016-03-07 NOTE — Progress Notes (Signed)
  Radiation Oncology         [phone] ________________________________  Name: Melanie Moss MRN: [id number]  Date: 03/07/2016  DOB: 04/08/1976  Simulation Verification Note    ICD-9-CM ICD-10-CM   1. Malignant neoplasm of lower-outer quadrant of right breast of female, estrogen receptor positive ([place]) 174.5 C50.511    V86.0 Z17.0     Status: outpatient  NARRATIVE: The patient was brought to the treatment unit and placed in the planned treatment position. The clinical setup was verified. Then port films were obtained and uploaded to the radiation oncology medical record software.  The treatment beams were carefully compared against the planned radiation fields. The position location and shape of the radiation fields was reviewed. They targeted volume of tissue appears to be appropriately covered by the radiation beams. Organs at risk appear to be excluded as planned.  Based on my personal review, I approved the simulation verification. The patient's treatment will proceed as planned.  -----------------------------------  Blair Promise, PhD, MD

## 2016-03-08 ENCOUNTER — Ambulatory Visit
Admission: RE | Admit: 2016-03-08 | Discharge: 2016-03-08 | Disposition: A | Discharge status: Home / Self Care | Payer: BLUE CROSS/BLUE SHIELD | Source: Ambulatory Visit | Attending: Radiation Oncology | Admitting: Radiation Oncology

## 2016-03-08 DIAGNOSIS — Z510 Encounter for antineoplastic radiation therapy: Secondary | ICD-10-CM | POA: Diagnosis not present

## 2016-03-09 ENCOUNTER — Ambulatory Visit
Admission: RE | Admit: 2016-03-09 | Discharge: 2016-03-09 | Disposition: A | Discharge status: Home / Self Care | Payer: BLUE CROSS/BLUE SHIELD | Source: Ambulatory Visit | Attending: Radiation Oncology | Admitting: Radiation Oncology

## 2016-03-09 ENCOUNTER — Encounter: Payer: Self-pay | Admitting: Radiation Oncology

## 2016-03-09 DIAGNOSIS — Z510 Encounter for antineoplastic radiation therapy: Secondary | ICD-10-CM | POA: Diagnosis not present

## 2016-03-09 NOTE — Progress Notes (Signed)
Office notes and treatment plan sent to [facility] on behalf of the patient 03/09/16, fax # [phone], conf received 11/17

## 2016-03-11 ENCOUNTER — Ambulatory Visit
Admission: RE | Admit: 2016-03-11 | Discharge: 2016-03-11 | Disposition: A | Discharge status: Home / Self Care | Payer: BLUE CROSS/BLUE SHIELD | Source: Ambulatory Visit | Attending: Radiation Oncology | Admitting: Radiation Oncology

## 2016-03-11 DIAGNOSIS — Z510 Encounter for antineoplastic radiation therapy: Secondary | ICD-10-CM | POA: Diagnosis not present

## 2016-03-12 ENCOUNTER — Telehealth: Payer: Self-pay | Admitting: Hematology

## 2016-03-12 ENCOUNTER — Ambulatory Visit
Admission: RE | Admit: 2016-03-12 | Discharge: 2016-03-12 | Disposition: A | Discharge status: Home / Self Care | Payer: BLUE CROSS/BLUE SHIELD | Source: Ambulatory Visit | Attending: Radiation Oncology | Admitting: Radiation Oncology

## 2016-03-12 ENCOUNTER — Encounter: Payer: Self-pay | Admitting: Radiation Oncology

## 2016-03-12 VITALS — BP 146/108 | HR 92 | Temp 98.2°F | Resp 20 | Wt 156.2 lb

## 2016-03-12 DIAGNOSIS — Z170 Estrogen receptor positive status [ER+]: Principal | ICD-10-CM

## 2016-03-12 DIAGNOSIS — C50511 Malignant neoplasm of lower-outer quadrant of right female breast: Secondary | ICD-10-CM

## 2016-03-12 DIAGNOSIS — Z510 Encounter for antineoplastic radiation therapy: Secondary | ICD-10-CM | POA: Diagnosis not present

## 2016-03-12 MED ORDER — ALRA NON-METALLIC DEODORANT (RAD-ONC): 1.0000 "application " | Freq: Once | TOPICAL | Status: DC

## 2016-03-12 MED ORDER — RADIAPLEXRX EX GEL
Freq: Once | CUTANEOUS | Status: AC
  Administered 2016-03-12: 13:00:00 via TOPICAL

## 2016-03-12 NOTE — Telephone Encounter (Signed)
Patient stopped by Scheduling, stated that she forgot to check out on 03/06/16. Message sent to chemo scheduler to be added, per 03/12/16 los. Appointments scheduled per 03/12/16 los. A copy of the AVS report & appointment schedule was given to patient,per 03/12/16 los.

## 2016-03-12 NOTE — Progress Notes (Signed)
  Radiation Oncology         [phone] ________________________________  Name: Melanie Moss MRN: [id number]  Date: 03/12/2016  DOB: 11/21/1975  Weekly Radiation Therapy Management    ICD-9-CM ICD-10-CM   1. Malignant neoplasm of lower-outer quadrant of right breast of female, estrogen receptor positive ([facility]) 174.5 C50.511 hyaluronate sodium (RADIAPLEXRX) gel   [id number] non-metallic deodorant (ALRA) 1 application     Current Dose: 9 Gy     Planned Dose:  61 Gy  Narrative . . . . . . . . The patient presents for routine under treatment assessment.                                                                    Set-up films were reviewed.                                 The chart was checked. 5 out of 33 treatments to the right chest wall/breast completed. The patient reports right arm swelling/lymphedema for which she wears a sleeve. She also reports swelling in the right breast, as well as at the top of her right axilla. Otherwise the patient is without complaint. Physical Findings. . .  weight is 156 lb 3.2 oz (70.9 kg). Her oral temperature is 98.2 F (36.8 C). Her blood pressure is 146/108 (abnormal) and her pulse is 92. Her respiration is 20. . Weight essentially stable.  No significant changes.  Mild swelling noted within the chest region. Impression . . . . . . . The patient is tolerating radiation. Plan . . . . . . . . . . . . Continue treatment as planned.  ________________________________    Blair Promise, PhD, MD  This document serves as a record of services personally performed by [name], MD. It was created on his behalf by Maryla Morrow, a trained medical scribe. The creation of this record is based on the scribe's personal observations and the provider's statements to them. This document has been checked and approved by the attending provider.

## 2016-03-12 NOTE — Progress Notes (Signed)
Weekly rad tx  Right chest wall/breat,5/33 completed,  Right arm swelling/lymphedema, she has a sleeve  she wears,  in right breast, swelling at top of axilla,  Patient education done, gave alra, radiaplex, radiation therapy and you book, Elmo Putt RN business card to the patient, discussed ways to manage side effects, fatigue,pain, skin irriation, , use radiaplex and deodorant after rad txs and bedtime daily, increase protein in diet, no underwire bra, use of electric razor only for under arm that is treated, , pat dry area treated, luke warm water only when showering, stay hydrated,. Drink plenty water,fluids, verbal understanding, teach back BP (!) 146/108 (BP Location: Left Arm, Patient Position: Sitting, Cuff Size: Small)   Pulse 92   Temp 98.2 F (36.8 C) (Oral)   Resp 20   Wt 156 lb 3.2 oz (70.9 kg)   BMI 27.67 kg/m   Wt Readings from Last 3 Encounters:  03/12/16 156 lb 3.2 oz (70.9 kg)  03/06/16 160 lb 3.2 oz (72.7 kg)  02/14/16 155 lb 3.2 oz (70.4 kg)

## 2016-03-13 ENCOUNTER — Ambulatory Visit
Admission: RE | Admit: 2016-03-13 | Discharge: 2016-03-13 | Disposition: A | Discharge status: Home / Self Care | Payer: BLUE CROSS/BLUE SHIELD | Source: Ambulatory Visit | Attending: Radiation Oncology | Admitting: Radiation Oncology

## 2016-03-13 ENCOUNTER — Ambulatory Visit: Payer: BLUE CROSS/BLUE SHIELD | Admitting: Hematology

## 2016-03-13 ENCOUNTER — Telehealth: Payer: Self-pay | Admitting: *Deleted

## 2016-03-13 ENCOUNTER — Ambulatory Visit
Admission: RE | Admit: 2016-03-13 | Payer: BLUE CROSS/BLUE SHIELD | Source: Ambulatory Visit | Admitting: Radiation Oncology

## 2016-03-13 ENCOUNTER — Other Ambulatory Visit: Payer: BLUE CROSS/BLUE SHIELD

## 2016-03-13 ENCOUNTER — Telehealth: Payer: Self-pay | Admitting: Oncology

## 2016-03-13 DIAGNOSIS — Z510 Encounter for antineoplastic radiation therapy: Secondary | ICD-10-CM | POA: Diagnosis not present

## 2016-03-13 NOTE — Telephone Encounter (Signed)
Message sent to chemo scheduler to be added per 03/13/16 los. Appointments scheduled per 03/13/16 los. A copy of the AVS report and appointment schedule was given to patient, per 03/13/16 los.

## 2016-03-13 NOTE — Telephone Encounter (Signed)
Appt in

## 2016-03-14 ENCOUNTER — Ambulatory Visit: Payer: BLUE CROSS/BLUE SHIELD

## 2016-03-14 ENCOUNTER — Ambulatory Visit: Admission: RE | Admit: 2016-03-14 | Payer: BLUE CROSS/BLUE SHIELD | Source: Ambulatory Visit

## 2016-03-16 ENCOUNTER — Telehealth: Payer: Self-pay | Admitting: *Deleted

## 2016-03-16 ENCOUNTER — Ambulatory Visit: Payer: BLUE CROSS/BLUE SHIELD

## 2016-03-16 NOTE — Telephone Encounter (Signed)
Per LOS I have scheduled appts and notified the scheduler 

## 2016-03-19 ENCOUNTER — Telehealth: Payer: Self-pay | Admitting: *Deleted

## 2016-03-19 ENCOUNTER — Ambulatory Visit
Admission: RE | Admit: 2016-03-19 | Discharge: 2016-03-19 | Disposition: A | Discharge status: Home / Self Care | Payer: BLUE CROSS/BLUE SHIELD | Source: Ambulatory Visit | Attending: Radiation Oncology | Admitting: Radiation Oncology

## 2016-03-19 ENCOUNTER — Other Ambulatory Visit: Payer: Self-pay | Admitting: *Deleted

## 2016-03-19 ENCOUNTER — Encounter: Payer: Self-pay | Admitting: Radiation Oncology

## 2016-03-19 DIAGNOSIS — C50511 Malignant neoplasm of lower-outer quadrant of right female breast: Secondary | ICD-10-CM

## 2016-03-19 DIAGNOSIS — Z510 Encounter for antineoplastic radiation therapy: Secondary | ICD-10-CM | POA: Diagnosis not present

## 2016-03-19 NOTE — Progress Notes (Signed)
Financial Counseling--patient brought in her income verificaiton--qualifies for 1,000.00 [employer]--scanned into share point--she is also receiving gas cards

## 2016-03-19 NOTE — Telephone Encounter (Signed)
I was told to get a medicine to use.  I have questions about this medicine.  Please call."  Called patient.  Message left to return call and leave name of medication.   Awaiting call from patient.

## 2016-03-19 NOTE — Telephone Encounter (Signed)
Received vm call from pt asking for return call about a prescription.  Returned call & pt would like a script for R arm compression sleeve with fingers. Message to Dr Burr Medico. Pt will come by for radiation tomorrow to pick up.

## 2016-03-19 NOTE — Progress Notes (Signed)
Paperwork ([facility]) received faxed to [phone], confirmation received 11/27, copy given to patient 11/28

## 2016-03-20 ENCOUNTER — Ambulatory Visit
Admission: RE | Admit: 2016-03-20 | Discharge: 2016-03-20 | Disposition: A | Discharge status: Home / Self Care | Payer: BLUE CROSS/BLUE SHIELD | Source: Ambulatory Visit | Attending: Radiation Oncology | Admitting: Radiation Oncology

## 2016-03-20 ENCOUNTER — Encounter: Payer: Self-pay | Admitting: *Deleted

## 2016-03-20 ENCOUNTER — Encounter: Payer: Self-pay | Admitting: Radiation Oncology

## 2016-03-20 VITALS — BP 139/97 | HR 98 | Temp 98.1°F | Ht 63.0 in | Wt 157.6 lb

## 2016-03-20 DIAGNOSIS — Z510 Encounter for antineoplastic radiation therapy: Secondary | ICD-10-CM | POA: Diagnosis not present

## 2016-03-20 DIAGNOSIS — Z170 Estrogen receptor positive status [ER+]: Principal | ICD-10-CM

## 2016-03-20 DIAGNOSIS — C50511 Malignant neoplasm of lower-outer quadrant of right female breast: Secondary | ICD-10-CM

## 2016-03-20 NOTE — Progress Notes (Signed)
  Radiation Oncology         [phone]) [phone] ________________________________  Name: Melanie Moss MRN: [id number]  Date: 03/20/2016  DOB: [birth year]  Weekly Radiation Therapy Management    ICD-9-CM ICD-10-CM   1. Malignant neoplasm of lower-outer quadrant of right breast of female, estrogen receptor positive ([place]) 174.5 C50.511    V86.0 Z17.0      Current Dose: 16.2 Gy     Planned Dose:  61 Gy  Narrative . . . . . . . . The patient presents for routine under treatment assessment.                                    The patient completed 8 fractions to her right chest wall. She denies having pain. She reports having nausea today. She denies an increase in fatigue. She is using radiaplex gel as directed. Per nursing, the skin on her right chest has slight hyperpigmentation.                                 Set-up films were reviewed.                                 The chart was checked. Physical Findings. . .  height is 5\' 3"  (1.6 m) and weight is 157 lb 9.6 oz (71.5 kg). Her oral temperature is 98.1 F (36.7 C). Her blood pressure is 139/97 (abnormal) and her pulse is 98. Her oxygen saturation is 100%. . Weight essentially stable.  No significant changes. Cardiopulmonary assessment is negative for acute distress and she exhibits normal effort. Mild hyperpigmentation changes along the right chest.  Impression . . . . . . . The patient is tolerating radiation. Plan . . . . . . . . . . . . Continue treatment as planned.  ________________________________    Blair Promise, PhD, MD  This document serves as a record of services personally performed by [name], MD. It was created on his behalf by Maryla Morrow, a trained medical scribe. The creation of this record is based on the scribe's personal observations and the provider's statements to them. This document has been checked and approved by the attending provider.

## 2016-03-20 NOTE — Progress Notes (Signed)
[facility] Work  Patient returned [facility] application and documentation to CSW.  CSW and patient reviewed application.  CSW submitted completed application and requested documents to Cancer Care.  CSW encouraged patient to call with questions or concerns.   Johnnye Lana, MSW, LCSW, OSW-C Clinical Social Worker [facility] [phone]

## 2016-03-20 NOTE — Progress Notes (Signed)
Melanie Moss has completed 8 fractions to her right chest wall.  She denies having pain.  She reports having nausea today.  She denies having an increase in fatigue.  She is using radiaplex gel.  The skin on her right chest has slight hyperpigmentation.  BP (!) 139/97 (BP Location: Left Arm, Patient Position: Sitting)   Pulse 98   Temp 98.1 F (36.7 C) (Oral)   Ht 5\' 3"  (1.6 m)   Wt 157 lb 9.6 oz (71.5 kg)   SpO2 100%   BMI 27.92 kg/m    Wt Readings from Last 3 Encounters:  03/20/16 157 lb 9.6 oz (71.5 kg)  03/12/16 156 lb 3.2 oz (70.9 kg)  03/06/16 160 lb 3.2 oz (72.7 kg)

## 2016-03-21 ENCOUNTER — Ambulatory Visit
Admission: RE | Admit: 2016-03-21 | Discharge: 2016-03-21 | Disposition: A | Discharge status: Home / Self Care | Payer: BLUE CROSS/BLUE SHIELD | Source: Ambulatory Visit | Attending: Radiation Oncology | Admitting: Radiation Oncology

## 2016-03-21 DIAGNOSIS — Z510 Encounter for antineoplastic radiation therapy: Secondary | ICD-10-CM | POA: Diagnosis not present

## 2016-03-21 MED FILL — COMPRESSION HAND SLEEVE: 1 days supply | Qty: 1 | Fill #0

## 2016-03-22 ENCOUNTER — Ambulatory Visit
Admission: RE | Admit: 2016-03-22 | Discharge: 2016-03-22 | Disposition: A | Discharge status: Home / Self Care | Payer: BLUE CROSS/BLUE SHIELD | Source: Ambulatory Visit | Attending: Radiation Oncology | Admitting: Radiation Oncology

## 2016-03-22 DIAGNOSIS — Z510 Encounter for antineoplastic radiation therapy: Secondary | ICD-10-CM | POA: Diagnosis not present

## 2016-03-23 ENCOUNTER — Ambulatory Visit
Admission: RE | Admit: 2016-03-23 | Discharge: 2016-03-23 | Disposition: A | Discharge status: Home / Self Care | Payer: BLUE CROSS/BLUE SHIELD | Source: Ambulatory Visit | Attending: Radiation Oncology | Admitting: Radiation Oncology

## 2016-03-23 DIAGNOSIS — Z510 Encounter for antineoplastic radiation therapy: Secondary | ICD-10-CM | POA: Diagnosis not present

## 2016-03-26 ENCOUNTER — Ambulatory Visit
Admission: RE | Admit: 2016-03-26 | Discharge: 2016-03-26 | Disposition: A | Discharge status: Home / Self Care | Payer: BLUE CROSS/BLUE SHIELD | Source: Ambulatory Visit | Attending: Radiation Oncology | Admitting: Radiation Oncology

## 2016-03-26 DIAGNOSIS — Z510 Encounter for antineoplastic radiation therapy: Secondary | ICD-10-CM | POA: Diagnosis not present

## 2016-03-27 ENCOUNTER — Other Ambulatory Visit (HOSPITAL_BASED_OUTPATIENT_CLINIC_OR_DEPARTMENT_OTHER): Payer: BLUE CROSS/BLUE SHIELD

## 2016-03-27 ENCOUNTER — Ambulatory Visit (HOSPITAL_BASED_OUTPATIENT_CLINIC_OR_DEPARTMENT_OTHER): Payer: BLUE CROSS/BLUE SHIELD

## 2016-03-27 ENCOUNTER — Encounter: Payer: Self-pay | Admitting: Radiation Oncology

## 2016-03-27 ENCOUNTER — Other Ambulatory Visit: Payer: Self-pay | Admitting: Hematology

## 2016-03-27 ENCOUNTER — Ambulatory Visit
Admission: RE | Admit: 2016-03-27 | Discharge: 2016-03-27 | Disposition: A | Discharge status: Home / Self Care | Payer: BLUE CROSS/BLUE SHIELD | Source: Ambulatory Visit | Attending: Radiation Oncology | Admitting: Radiation Oncology

## 2016-03-27 ENCOUNTER — Encounter: Payer: Self-pay | Admitting: *Deleted

## 2016-03-27 VITALS — BP 140/91 | HR 86 | Temp 98.5°F | Resp 16

## 2016-03-27 VITALS — BP 156/94 | HR 78 | Temp 98.3°F | Ht 63.0 in | Wt 159.4 lb

## 2016-03-27 DIAGNOSIS — Z170 Estrogen receptor positive status [ER+]: Principal | ICD-10-CM

## 2016-03-27 DIAGNOSIS — Z5112 Encounter for antineoplastic immunotherapy: Secondary | ICD-10-CM | POA: Diagnosis not present

## 2016-03-27 DIAGNOSIS — C50511 Malignant neoplasm of lower-outer quadrant of right female breast: Secondary | ICD-10-CM

## 2016-03-27 DIAGNOSIS — D509 Iron deficiency anemia, unspecified: Secondary | ICD-10-CM

## 2016-03-27 DIAGNOSIS — D500 Iron deficiency anemia secondary to blood loss (chronic): Secondary | ICD-10-CM

## 2016-03-27 DIAGNOSIS — Z510 Encounter for antineoplastic radiation therapy: Secondary | ICD-10-CM | POA: Diagnosis not present

## 2016-03-27 LAB — COMPREHENSIVE METABOLIC PANEL
ALT: 18 U/L (ref 0–55)
AST: 29 U/L (ref 5–34)
Albumin: 3.9 g/dL (ref 3.5–5.0)
Alkaline Phosphatase: 64 U/L (ref 40–150)
Anion Gap: 11 mEq/L (ref 3–11)
BUN: 16 mg/dL (ref 7.0–26.0)
CO2: 26 mEq/L (ref 22–29)
Calcium: 10.1 mg/dL (ref 8.4–10.4)
Chloride: 104 mEq/L (ref 98–109)
Creatinine: 0.9 mg/dL (ref 0.6–1.1)
EGFR: 87 mL/min/{1.73_m2} — ABNORMAL LOW (ref >90)
Glucose: 98 mg/dl (ref 70–140)
Potassium: 3.5 mEq/L (ref 3.5–5.1)
Sodium: 141 mEq/L (ref 136–145)
Total Bilirubin: 0.56 mg/dL (ref 0.20–1.20)
Total Protein: 7.8 g/dL (ref 6.4–8.3)

## 2016-03-27 LAB — CBC WITH DIFFERENTIAL/PLATELET
BASO%: 0 % (ref 0.0–2.0)
Basophils Absolute: 0 10*3/uL (ref 0.0–0.1)
EOS%: 0.8 % (ref 0.0–7.0)
Eosinophils Absolute: 0.1 10*3/uL (ref 0.0–0.5)
HCT: 28.8 % — ABNORMAL LOW (ref 34.8–46.6)
HGB: 9.7 g/dL — ABNORMAL LOW (ref 11.6–15.9)
LYMPH%: 19.5 % (ref 14.0–49.7)
MCH: 30.9 pg (ref 25.1–34.0)
MCHC: 33.7 g/dL (ref 31.5–36.0)
MCV: 91.7 fL (ref 79.5–101.0)
MONO#: 0.5 10*3/uL (ref 0.1–0.9)
MONO%: 7.9 % (ref 0.0–14.0)
NEUT#: 4.7 10*3/uL (ref 1.5–6.5)
NEUT%: 71.8 % (ref 38.4–76.8)
Platelets: 189 10*3/uL (ref 145–400)
RBC: 3.14 10*6/uL — ABNORMAL LOW (ref 3.70–5.45)
RDW: 16.1 % — ABNORMAL HIGH (ref 11.2–14.5)
WBC: 6.6 10*3/uL (ref 3.9–10.3)
lymph#: 1.3 10*3/uL (ref 0.9–3.3)
nRBC: 0 % (ref 0–0)

## 2016-03-27 LAB — IRON AND TIBC
%SAT: 36 % (ref 21–57)
Iron: 99 ug/dL (ref 41–142)
TIBC: 273 ug/dL (ref 236–444)
UIBC: 173 ug/dL (ref 120–384)

## 2016-03-27 LAB — FERRITIN: Ferritin: 491 ng/ml — ABNORMAL HIGH (ref 9–269)

## 2016-03-27 MED ORDER — ACETAMINOPHEN 325 MG PO TABS
650.0000 mg | ORAL_TABLET | Freq: Once | ORAL | Status: AC
  Administered 2016-03-27: 650 mg via ORAL

## 2016-03-27 MED ORDER — DIPHENHYDRAMINE HCL 25 MG PO CAPS
50.0000 mg | ORAL_CAPSULE | Freq: Once | ORAL | Status: AC
  Administered 2016-03-27: 50 mg via ORAL

## 2016-03-27 MED ORDER — SODIUM CHLORIDE 0.9 % IV SOLN
6.0000 mg/kg | Freq: Once | INTRAVENOUS | Status: AC
  Administered 2016-03-27: 420 mg via INTRAVENOUS
  Filled 2016-03-27: qty 20

## 2016-03-27 MED ORDER — SODIUM CHLORIDE 0.9 % IV SOLN
Freq: Once | INTRAVENOUS | Status: AC
  Administered 2016-03-27: 11:00:00 via INTRAVENOUS

## 2016-03-27 MED ORDER — ACETAMINOPHEN 325 MG PO TABS
ORAL_TABLET | ORAL | Status: AC
  Filled 2016-03-27: qty 2

## 2016-03-27 MED ORDER — HEPARIN SOD (PORK) LOCK FLUSH 100 UNIT/ML IV SOLN
500.0000 [IU] | Freq: Once | INTRAVENOUS | Status: AC | PRN
  Administered 2016-03-27: 500 [IU]
  Filled 2016-03-27: qty 5

## 2016-03-27 MED ORDER — SODIUM CHLORIDE 0.9% FLUSH
10.0000 mL | INTRAVENOUS | Status: DC | PRN
  Administered 2016-03-27: 10 mL
  Filled 2016-03-27: qty 10

## 2016-03-27 MED ORDER — DIPHENHYDRAMINE HCL 25 MG PO CAPS
ORAL_CAPSULE | ORAL | Status: AC
  Filled 2016-03-27: qty 2

## 2016-03-27 NOTE — Progress Notes (Signed)
Melanie Moss has completed 13 fractions to her right chest wall.  She denies having pain other than from lyphedema in her right arm and hand.  She is wearing a compression sleeve.  She is using radiaplex bid.  She reports having fatigue.  The skin on her right chest wall has hyperpigmentation.  BP (!) 156/94 (BP Location: Left Arm, Patient Position: Sitting)   Pulse 78   Temp 98.3 F (36.8 C) (Oral)   Ht 5\' 3"  (1.6 m)   Wt 159 lb 6.4 oz (72.3 kg)   SpO2 100%   BMI 28.24 kg/m    Wt Readings from Last 3 Encounters:  03/27/16 159 lb 6.4 oz (72.3 kg)  03/20/16 157 lb 9.6 oz (71.5 kg)  03/12/16 156 lb 3.2 oz (70.9 kg)

## 2016-03-27 NOTE — Patient Instructions (Signed)
[facility] Discharge Instructions for Patients Receiving Chemotherapy  Today you received the following chemotherapy agents: Herceptin   To help prevent nausea and vomiting after your treatment, we encourage you to take your nausea medication as directed.    If you develop nausea and vomiting that is not controlled by your nausea medication, call the clinic.   BELOW ARE SYMPTOMS THAT SHOULD BE REPORTED IMMEDIATELY:  *FEVER GREATER THAN 100.5 F  *CHILLS WITH OR WITHOUT FEVER  NAUSEA AND VOMITING THAT IS NOT CONTROLLED WITH YOUR NAUSEA MEDICATION  *UNUSUAL SHORTNESS OF BREATH  *UNUSUAL BRUISING OR BLEEDING  TENDERNESS IN MOUTH AND THROAT WITH OR WITHOUT PRESENCE OF ULCERS  *URINARY PROBLEMS  *BOWEL PROBLEMS  UNUSUAL RASH Items with * indicate a potential emergency and should be followed up as soon as possible.  Feel free to call the clinic you have any questions or concerns. The clinic phone number is [phone].  Please show the CHEMO ALERT CARD at check-in to the Emergency Department and triage nurse.   

## 2016-03-27 NOTE — Progress Notes (Signed)
  Radiation Oncology         [phone] ________________________________  Name: Melanie Moss MRN: [id number]  Date: 03/27/2016  DOB: 11/13/1975  Weekly Radiation Therapy Management    ICD-9-CM ICD-10-CM   1. Malignant neoplasm of lower-outer quadrant of right breast of female, estrogen receptor positive ([place]) 174.5 C50.511    V86.0 Z17.0       Current Dose: 23.4 Gy     Planned Dose:  61 Gy  Narrative . . . . . . . . The patient presents for routine under treatment assessment.                                    Thre patient has completed 13 fractions to her right chest wall. She denies having pain, other than that from lymphedema in her right arm and hand, for which she occasionally takes Tylenol. She is using radiaplex twice a day. The patient reports having fatigue. Per nursing, the patient has hyperpigmentation of the skin on her right chest wall.                                   Set-up films were reviewed.                                 The chart was checked. Physical Findings. . .  height is 5\' 3"  (1.6 m) and weight is 159 lb 6.4 oz (72.3 kg). Her oral temperature is 98.3 F (36.8 C). Her blood pressure is 156/94 (abnormal) and her pulse is 78. Her oxygen saturation is 100%. . Weight essentially stable.  No significant changes. Cardiopulmonary assessment is negative for acute distress and she exhibits normal effort. Hyperpigmentation changes along the right chest. Impression . . . . . . . The patient is tolerating radiation. She is wearing a compression sleeve on her right arm. Plan . . . . . . . . . . . . Continue treatment as planned.  ________________________________    Blair Promise, PhD, MD  This document serves as a record of services personally performed by [name], MD. It was created on his behalf by Maryla Morrow, a trained medical scribe. The creation of this record is based on the scribe's personal observations and the provider's statements to them. This  document has been checked and approved by the attending provider.

## 2016-03-28 ENCOUNTER — Ambulatory Visit
Admission: RE | Admit: 2016-03-28 | Discharge: 2016-03-28 | Disposition: A | Discharge status: Home / Self Care | Payer: BLUE CROSS/BLUE SHIELD | Source: Ambulatory Visit | Attending: Radiation Oncology | Admitting: Radiation Oncology

## 2016-03-28 DIAGNOSIS — Z510 Encounter for antineoplastic radiation therapy: Secondary | ICD-10-CM | POA: Diagnosis not present

## 2016-03-29 ENCOUNTER — Ambulatory Visit
Admission: RE | Admit: 2016-03-29 | Discharge: 2016-03-29 | Disposition: A | Discharge status: Home / Self Care | Payer: BLUE CROSS/BLUE SHIELD | Source: Ambulatory Visit | Attending: Radiation Oncology | Admitting: Radiation Oncology

## 2016-03-29 DIAGNOSIS — Z510 Encounter for antineoplastic radiation therapy: Secondary | ICD-10-CM | POA: Diagnosis not present

## 2016-03-30 ENCOUNTER — Ambulatory Visit
Admission: RE | Admit: 2016-03-30 | Discharge: 2016-03-30 | Disposition: A | Discharge status: Home / Self Care | Payer: BLUE CROSS/BLUE SHIELD | Source: Ambulatory Visit | Attending: Radiation Oncology | Admitting: Radiation Oncology

## 2016-03-30 DIAGNOSIS — Z510 Encounter for antineoplastic radiation therapy: Secondary | ICD-10-CM | POA: Diagnosis not present

## 2016-04-02 ENCOUNTER — Ambulatory Visit
Admission: RE | Admit: 2016-04-02 | Discharge: 2016-04-02 | Disposition: A | Discharge status: Home / Self Care | Payer: BLUE CROSS/BLUE SHIELD | Source: Ambulatory Visit | Attending: Radiation Oncology | Admitting: Radiation Oncology

## 2016-04-02 DIAGNOSIS — Z510 Encounter for antineoplastic radiation therapy: Secondary | ICD-10-CM | POA: Diagnosis not present

## 2016-04-03 ENCOUNTER — Encounter: Payer: Self-pay | Admitting: Radiation Oncology

## 2016-04-03 ENCOUNTER — Ambulatory Visit
Admission: RE | Admit: 2016-04-03 | Discharge: 2016-04-03 | Disposition: A | Discharge status: Home / Self Care | Payer: BLUE CROSS/BLUE SHIELD | Source: Ambulatory Visit | Attending: Radiation Oncology | Admitting: Radiation Oncology

## 2016-04-03 VITALS — BP 153/104 | HR 79 | Temp 98.3°F | Ht 63.0 in | Wt 159.4 lb

## 2016-04-03 DIAGNOSIS — C50511 Malignant neoplasm of lower-outer quadrant of right female breast: Secondary | ICD-10-CM | POA: Insufficient documentation

## 2016-04-03 DIAGNOSIS — Z510 Encounter for antineoplastic radiation therapy: Secondary | ICD-10-CM | POA: Diagnosis not present

## 2016-04-03 DIAGNOSIS — Z170 Estrogen receptor positive status [ER+]: Principal | ICD-10-CM

## 2016-04-03 MED ORDER — BIAFINE EX EMUL
Freq: Once | CUTANEOUS | Status: AC
  Administered 2016-04-03: 12:00:00 via TOPICAL

## 2016-04-03 NOTE — Progress Notes (Signed)
Paperwork ([facility]) received, given to nurse 04/04/16

## 2016-04-03 NOTE — Progress Notes (Signed)
  Radiation Oncology         (336) [phone] ________________________________  Name: Melanie Moss MRN: [id number]  Date: 04/03/2016  DOB: 05-01-1975  Electron Beam Simulation  Note    ICD-9-CM ICD-10-CM   1. Malignant neoplasm of lower-outer quadrant of right breast of female, estrogen receptor positive ([place]) 174.5 C50.511    V86.0 Z17.0     Status: outpatient  NARRATIVE: The patient was brought to the treatment unit and placed in the planned treatment position. The clinical setup was verified. Then port films were obtained and uploaded to the radiation oncology medical record software.  The treatment beams were carefully compared against the planned radiation fields. The position location and shape of the radiation fields was reviewed. They targeted volume of tissue appears to be appropriately covered by the radiation beams. Organs at risk appear to be excluded as planned.  Based on my personal review, I approved the simulation verification. The patient's treatment will proceed as planned.  -----------------------------------  Blair Promise, PhD, MD

## 2016-04-03 NOTE — Progress Notes (Addendum)
Johniece Fluke has completed 18 fractions to her right chest wall.  She reports having some discomfort in her right chest from skin irritation/burning.  She reports having fatigue the last couple of days.  She is using radiaplex. The skin on her right chest has hyperpigmentation.  She will be given biafine lotion to try.  Her bp was elevated today at 153/104.  She said she has been checking it at home and her diastolic bp is in the 99991111.  BP (!) 153/104 (BP Location: Left Arm, Patient Position: Sitting)   Pulse 79   Temp 98.3 F (36.8 C) (Oral)   Ht 5\' 3"  (1.6 m)   Wt 159 lb 6.4 oz (72.3 kg)   SpO2 100%   BMI 28.24 kg/m    Wt Readings from Last 3 Encounters:  04/03/16 159 lb 6.4 oz (72.3 kg)  03/27/16 159 lb 6.4 oz (72.3 kg)  03/20/16 157 lb 9.6 oz (71.5 kg)

## 2016-04-03 NOTE — Progress Notes (Signed)
  Radiation Oncology         (336) [phone] ________________________________  Name: Melanie Moss MRN: [id number]  Date: 04/03/2016  DOB: Aug 22, 1975  Weekly Radiation Therapy Management    ICD-9-CM ICD-10-CM   1. Malignant neoplasm of lower-outer quadrant of right breast of female, estrogen receptor positive ([facility]) 174.5 C50.511 topical emolient (BIAFINE) emulsion   V86.0 Z17.0       Current Dose: 32.4 Gy     Planned Dose:  61 Gy  Narrative . . . . . . . . The patient presents for routine under treatment assessment.                                    The patient has completed 18 fractions to her right chest wall. Her BP was elevated today at 153/104; she says she checks it at home and her diastolic BP is normally in the 80's. She reports fatigue the last couple of days. She reports some discomfort in her right chest from skin burning and irritation. The patient reports she is using radiaplex as directed.                                   Set-up films were reviewed.                                 The chart was checked. Physical Findings. . .  height is 5\' 3"  (1.6 m) and weight is 159 lb 6.4 oz (72.3 kg). Her oral temperature is 98.3 F (36.8 C). Her blood pressure is 153/104 (abnormal) and her pulse is 79. Her oxygen saturation is 100%. . Weight essentially stable.  No significant changes. Cardiopulmonary assessment is negative for acute distress and she exhibits normal effort. Lungs clear to auscultation bilaterally. Right chest wall area shows Hyperpigmentation changes and implant/expander in place. Impression . . . . . . . The patient is tolerating radiation. She is wearing a compression sleeve on her right arm. Plan . . . . . . . . . . . . Continue treatment as planned.  ________________________________    Blair Promise, PhD, MD  This document serves as a record of services personally performed by [name], MD. It was created on his behalf by Maryla Morrow, a trained medical  scribe. The creation of this record is based on the scribe's personal observations and the provider's statements to them. This document has been checked and approved by the attending provider.

## 2016-04-04 ENCOUNTER — Ambulatory Visit: Payer: BLUE CROSS/BLUE SHIELD

## 2016-04-05 ENCOUNTER — Ambulatory Visit
Admission: RE | Admit: 2016-04-05 | Discharge: 2016-04-05 | Disposition: A | Discharge status: Home / Self Care | Payer: BLUE CROSS/BLUE SHIELD | Source: Ambulatory Visit | Attending: Radiation Oncology | Admitting: Radiation Oncology

## 2016-04-05 DIAGNOSIS — Z510 Encounter for antineoplastic radiation therapy: Secondary | ICD-10-CM | POA: Diagnosis not present

## 2016-04-06 ENCOUNTER — Ambulatory Visit
Admission: RE | Admit: 2016-04-06 | Discharge: 2016-04-06 | Disposition: A | Discharge status: Home / Self Care | Payer: BLUE CROSS/BLUE SHIELD | Source: Ambulatory Visit | Attending: Radiation Oncology | Admitting: Radiation Oncology

## 2016-04-06 ENCOUNTER — Encounter: Payer: Self-pay | Admitting: Radiation Oncology

## 2016-04-06 DIAGNOSIS — Z510 Encounter for antineoplastic radiation therapy: Secondary | ICD-10-CM | POA: Diagnosis not present

## 2016-04-06 NOTE — Progress Notes (Signed)
Paperwork ([facility]) paperwork received, office notes faxed to [phone], confirmation received

## 2016-04-09 ENCOUNTER — Ambulatory Visit
Admission: RE | Admit: 2016-04-09 | Discharge: 2016-04-09 | Disposition: A | Discharge status: Home / Self Care | Payer: BLUE CROSS/BLUE SHIELD | Source: Ambulatory Visit | Attending: Radiation Oncology | Admitting: Radiation Oncology

## 2016-04-09 DIAGNOSIS — Z510 Encounter for antineoplastic radiation therapy: Secondary | ICD-10-CM | POA: Diagnosis not present

## 2016-04-10 ENCOUNTER — Ambulatory Visit (HOSPITAL_COMMUNITY)
Admission: RE | Admit: 2016-04-10 | Discharge: 2016-04-10 | Disposition: A | Discharge status: Home / Self Care | Payer: BLUE CROSS/BLUE SHIELD | Source: Ambulatory Visit | Attending: Internal Medicine | Admitting: Internal Medicine

## 2016-04-10 ENCOUNTER — Ambulatory Visit
Admission: RE | Admit: 2016-04-10 | Discharge: 2016-04-10 | Disposition: A | Discharge status: Home / Self Care | Payer: BLUE CROSS/BLUE SHIELD | Source: Ambulatory Visit | Attending: Radiation Oncology | Admitting: Radiation Oncology

## 2016-04-10 ENCOUNTER — Encounter (HOSPITAL_COMMUNITY): Payer: Self-pay

## 2016-04-10 ENCOUNTER — Ambulatory Visit (HOSPITAL_BASED_OUTPATIENT_CLINIC_OR_DEPARTMENT_OTHER)
Admission: RE | Admit: 2016-04-10 | Discharge: 2016-04-10 | Disposition: A | Discharge status: Home / Self Care | Payer: BLUE CROSS/BLUE SHIELD | Source: Ambulatory Visit | Attending: Cardiology | Admitting: Cardiology

## 2016-04-10 VITALS — BP 147/94 | HR 88 | Resp 16 | Wt 158.4 lb

## 2016-04-10 VITALS — BP 128/88 | HR 92 | Wt 157.8 lb

## 2016-04-10 DIAGNOSIS — C50511 Malignant neoplasm of lower-outer quadrant of right female breast: Secondary | ICD-10-CM

## 2016-04-10 DIAGNOSIS — Z09 Encounter for follow-up examination after completed treatment for conditions other than malignant neoplasm: Secondary | ICD-10-CM | POA: Insufficient documentation

## 2016-04-10 DIAGNOSIS — I10 Essential (primary) hypertension: Secondary | ICD-10-CM

## 2016-04-10 DIAGNOSIS — Z510 Encounter for antineoplastic radiation therapy: Secondary | ICD-10-CM | POA: Diagnosis not present

## 2016-04-10 LAB — ECHOCARDIOGRAM COMPLETE
E/e' ratio: 5.28
FS: 40 % (ref 28–44)
IVS/LV PW RATIO, ED: 0.79
LA ID, A-P, ES: 30 mm
LA diam end sys: 30 mm
LA diam index: 1.71 cm/m2
LA vol A4C: 35.8 ml
LA vol index: 20.7 mL/m2
LA vol: 36.2 mL
LV E/e' medial: 5.28
LV E/e'average: 5.28
LV PW d: 12 mm — AB (ref 0.6–1.1)
LV e' LATERAL: 13.7 cm/s
LVOT SV: 61 mL
LVOT VTI: 19.5 cm
LVOT area: 3.14 cm2
LVOT diameter: 20 mm
LVOT peak grad rest: 5 mmHg
LVOT peak vel: 114 cm/s
Lateral S' vel: 11 cm/s
MV Peak grad: 2 mmHg
MV pk A vel: 90.2 m/s
MV pk E vel: 72.3 m/s
TAPSE: 24.3 mm
TDI e' lateral: 13.7
TDI e' medial: 11.9
Weight: 2534.4 oz

## 2016-04-10 MED ORDER — AMLODIPINE BESYLATE 10 MG PO TABS: 10.0000 mg | ORAL_TABLET | Freq: Every day | ORAL | 3 refills | Status: DC

## 2016-04-10 MED FILL — AMLODIPINE BESYLATE 10 MG T: 10 | 30 days supply | Qty: 30 | Fill #0

## 2016-04-10 NOTE — Progress Notes (Signed)
  Echocardiogram 2D Echocardiogram has been performed.  Melanie Moss 04/10/2016, 2:07 PM

## 2016-04-10 NOTE — Patient Instructions (Signed)
Increase Amlodipine to 10 mg daily  Your physician recommends that you schedule a follow-up appointment in: 3 months with echocardiogram

## 2016-04-10 NOTE — Progress Notes (Signed)
  Radiation Oncology         [phone] ________________________________  Name: Melanie Moss MRN: [id number]  Date: 04/10/2016  DOB: 09-21-1975  Electron Beam Simulation Verification Note    ICD-9-CM ICD-10-CM   1. 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 The clinical setup was verified. Then port films were obtained and uploaded to the radiation oncology medical record software.  The treatment beams were carefully compared against the planned radiation fields. The position location and shape of the radiation fields was reviewed. They targeted volume of tissue appears to be appropriately covered by the radiation beams. Organs at risk appear to be excluded as planned.  Based on my personal review, I approved the simulation verification. The patient's treatment will proceed as planned.  -----------------------------------  Blair Promise, PhD, MD

## 2016-04-10 NOTE — Progress Notes (Signed)
Patient ID: Melanie Moss, female   DOB: [birth year], 39 y.o.   MRN: [id number] Oncologist: Dr Burr Medico  40 yo with history of breast cancer presents for cardio-oncology evaluation.  Breast cancer was diagnosed in 4/17, multifocal cancer in right breast.  One biopsied site was ER+/PR+/HER2+.  Other biopsied site was ER+/PR+/HER2-.  She had right mastectomy in 6/17.  She has started  chemotherapy with Herceptin to continue x 1 year.    No chest pain, no exertional dyspnea.  BP has been running high at home, 140s/100s.    Labs (4/17): K 4, creatinine 0.8  PMH: 1. Breast cancer: Diagnosed in 4/17, multifocal cancer in right breast.  One biopsied site was ER+/PR+/HER2+.  Other biopsied site was ER+/PR+/HER2-. Right mastectomy in 6/17.  - Echo (4/17): EF 55-60%, GLS -19.9%, lateral s' 13.9 cm/sec - Echo (8/17): EF 65-70%, GLS -19%.  - Echo (12/17): EF 65-70%, GLS -18.2%, normal RV size and systolic function.  2. Uterine fibroids 3. GERD  Social History   Social History  . Marital status: Single    Spouse name: N/A  . Number of children: 1  . Years of education: N/A   Occupational History  . Not on file.   Social History Main Topics  . Smoking status: Never Smoker  . Smokeless tobacco: Never Used  . Alcohol use No  . Drug use: No  . Sexual activity: Yes   Other Topics Concern  . Not on file   Social History Narrative  . No narrative on file   Family History  Problem Relation Age of Onset  . Hypertension Mother   . Hypertension Father   . Prostate cancer Father 58  . Breast cancer Maternal Aunt 16  . Breast cancer Paternal Aunt 69  . Cirrhosis Maternal Uncle   . Prostate cancer Paternal Uncle   . Throat cancer Maternal Aunt     non smoker  . Prostate cancer Paternal Uncle   . Colon cancer Cousin     maternal first cousin dx in his 59s  . Lung cancer Cousin     smoker   ROS: All systems reviewed and negative except as per HPI.   BP 128/88   Pulse 92   Wt 157 lb 12.8 oz  (71.6 kg)   SpO2 100%   BMI 27.95 kg/m  General: NAD Neck: No JVD, no thyromegaly or thyroid nodule.  Lungs: Clear to auscultation bilaterally with normal respiratory effort. CV: Nondisplaced PMI.  Heart regular S1/S2, no S3/S4, no murmur.  No peripheral edema.  No carotid bruit.  Normal pedal pulses.  Abdomen: Soft, nontender, no hepatosplenomegaly, no distention.  Skin: Intact without lesions or rashes.  Neurologic: Alert and oriented x 3.  Psych: Normal affect. Extremities: No clubbing or cyanosis. Right arm lymphedema.  HEENT: Normal.   Assessment/Plan: 1. Breast cancer:  She is getting Herceptin-based therapy for a year.  I reviewed today's echo.  It was stable compared to prior echos.  No change to therapy.  2. HTN: BP continues to run high.  Increase amlodipine to 10 mg daily.  She will call in 2 wks with her BP readings (has cuff at home).    Loralie Champagne 04/10/2016

## 2016-04-10 NOTE — Progress Notes (Addendum)
Weight stable. BP continues to be elevated. Reports she is waiting to get in to see PCP about elevated BP. Reports discomfort right chest wall continues. Provided patient with Telfa to provide a buffer between her chest wall and bra. Reports moderate fatigue. Reports hyperpigmentation within treatment field. Reports using radiaplex within treatment field as directed with neosporin to areas of broken skin. Evidence of lymphedema noted right arm. Patient is not wearing lymph sleeve today.   BP (!) 147/94 (BP Location: Left Arm, Patient Position: Sitting, Cuff Size: Normal)   Pulse 88   Resp 16   Wt 158 lb 6.4 oz (71.8 kg)   SpO2 100%   BMI 28.06 kg/m    Wt Readings from Last 3 Encounters:  04/10/16 158 lb 6.4 oz (71.8 kg)  04/03/16 159 lb 6.4 oz (72.3 kg)  03/27/16 159 lb 6.4 oz (72.3 kg)

## 2016-04-10 NOTE — Progress Notes (Signed)
  Radiation Oncology         [phone] ________________________________  Name: Melanie Moss MRN: [id number]  Date: 04/10/2016  DOB: 1975/05/01  Weekly Radiation Therapy Management    ICD-9-CM ICD-10-CM   1. Malignant neoplasm of lower-outer quadrant of right female breast, unspecified estrogen receptor status ([place]) 174.5 C50.511       Current Dose: 39.6 Gy     Planned Dose:  61 Gy  Narrative . . . . . . . . The patient presents for routine under treatment assessment.                                    BP continues to be elevated. Reports she is waiting to get in to see PCP about elevated BP. Reports right chest wall discomfort continues. The nurse provided the patient with Telfa to provide a buffer between her chest wall and bra. Reports moderate fatigue. Reports hyperpigmentation within the treatment field. Reports using radiaplex within treatment field as directed with neosporin to areas of broken skin. Evidence of right arm lymphedema noted by the nurse. Patient is not wearing lymph sleeve today.                                    Set-up films were reviewed.                                 The chart was checked. Physical Findings. . .  weight is 158 lb 6.4 oz (71.8 kg). Her blood pressure is 147/94 (abnormal) and her pulse is 88. Her respiration is 16 and oxygen saturation is 100%. . Weight essentially stable.  No significant changes. Cardiopulmonary assessment is negative for acute distress and she exhibits normal effort. Lungs clear to auscultation bilaterally. Right chest wall area shows hyperpigmentation changes and implant/expander in place, but no moist desquamation. Impression . . . . . . . The patient is tolerating radiation. Plan . . . . . . . . . . . . Continue treatment as planned. ________________________________    Blair Promise, PhD, MD  This document serves as a record of services personally performed by [name], MD. It was created on his behalf by Darcus Austin, a trained medical scribe. The creation of this record is based on the scribe's personal observations and the provider's statements to them. This document has been checked and approved by the attending provider.

## 2016-04-11 ENCOUNTER — Ambulatory Visit
Admission: RE | Admit: 2016-04-11 | Discharge: 2016-04-11 | Disposition: A | Discharge status: Home / Self Care | Payer: BLUE CROSS/BLUE SHIELD | Source: Ambulatory Visit | Attending: Radiation Oncology | Admitting: Radiation Oncology

## 2016-04-11 DIAGNOSIS — Z510 Encounter for antineoplastic radiation therapy: Secondary | ICD-10-CM | POA: Diagnosis not present

## 2016-04-12 ENCOUNTER — Ambulatory Visit
Admission: RE | Admit: 2016-04-12 | Discharge: 2016-04-12 | Disposition: A | Discharge status: Home / Self Care | Payer: BLUE CROSS/BLUE SHIELD | Source: Ambulatory Visit | Attending: Radiation Oncology | Admitting: Radiation Oncology

## 2016-04-12 ENCOUNTER — Ambulatory Visit: Payer: BLUE CROSS/BLUE SHIELD

## 2016-04-12 DIAGNOSIS — Z510 Encounter for antineoplastic radiation therapy: Secondary | ICD-10-CM | POA: Diagnosis not present

## 2016-04-13 ENCOUNTER — Ambulatory Visit
Admission: RE | Admit: 2016-04-13 | Discharge: 2016-04-13 | Disposition: A | Discharge status: Home / Self Care | Payer: BLUE CROSS/BLUE SHIELD | Source: Ambulatory Visit | Attending: Radiation Oncology | Admitting: Radiation Oncology

## 2016-04-13 ENCOUNTER — Ambulatory Visit: Payer: BLUE CROSS/BLUE SHIELD

## 2016-04-13 DIAGNOSIS — Z510 Encounter for antineoplastic radiation therapy: Secondary | ICD-10-CM | POA: Diagnosis not present

## 2016-04-17 ENCOUNTER — Ambulatory Visit
Admission: RE | Admit: 2016-04-17 | Discharge: 2016-04-17 | Disposition: A | Discharge status: Home / Self Care | Payer: BLUE CROSS/BLUE SHIELD | Source: Ambulatory Visit | Attending: Radiation Oncology | Admitting: Radiation Oncology

## 2016-04-17 ENCOUNTER — Other Ambulatory Visit (HOSPITAL_BASED_OUTPATIENT_CLINIC_OR_DEPARTMENT_OTHER): Payer: BLUE CROSS/BLUE SHIELD

## 2016-04-17 ENCOUNTER — Other Ambulatory Visit: Payer: Self-pay | Admitting: *Deleted

## 2016-04-17 ENCOUNTER — Encounter: Payer: Self-pay | Admitting: Hematology

## 2016-04-17 ENCOUNTER — Ambulatory Visit (HOSPITAL_BASED_OUTPATIENT_CLINIC_OR_DEPARTMENT_OTHER): Payer: BLUE CROSS/BLUE SHIELD | Admitting: Hematology

## 2016-04-17 ENCOUNTER — Ambulatory Visit (HOSPITAL_BASED_OUTPATIENT_CLINIC_OR_DEPARTMENT_OTHER): Payer: BLUE CROSS/BLUE SHIELD

## 2016-04-17 VITALS — BP 139/97 | HR 98 | Resp 18 | Wt 159.0 lb

## 2016-04-17 VITALS — BP 141/81 | HR 102 | Temp 98.2°F | Resp 18 | Ht 63.0 in | Wt 158.4 lb

## 2016-04-17 DIAGNOSIS — I10 Essential (primary) hypertension: Secondary | ICD-10-CM | POA: Diagnosis not present

## 2016-04-17 DIAGNOSIS — D6481 Anemia due to antineoplastic chemotherapy: Secondary | ICD-10-CM

## 2016-04-17 DIAGNOSIS — C773 Secondary and unspecified malignant neoplasm of axilla and upper limb lymph nodes: Secondary | ICD-10-CM

## 2016-04-17 DIAGNOSIS — C50511 Malignant neoplasm of lower-outer quadrant of right female breast: Secondary | ICD-10-CM

## 2016-04-17 DIAGNOSIS — Z170 Estrogen receptor positive status [ER+]: Principal | ICD-10-CM

## 2016-04-17 DIAGNOSIS — I890 Lymphedema, not elsewhere classified: Secondary | ICD-10-CM

## 2016-04-17 DIAGNOSIS — D500 Iron deficiency anemia secondary to blood loss (chronic): Secondary | ICD-10-CM

## 2016-04-17 DIAGNOSIS — Z5112 Encounter for antineoplastic immunotherapy: Secondary | ICD-10-CM

## 2016-04-17 DIAGNOSIS — Z510 Encounter for antineoplastic radiation therapy: Secondary | ICD-10-CM | POA: Diagnosis not present

## 2016-04-17 DIAGNOSIS — Z95828 Presence of other vascular implants and grafts: Secondary | ICD-10-CM

## 2016-04-17 LAB — COMPREHENSIVE METABOLIC PANEL
ALT: 17 U/L (ref 0–55)
AST: 24 U/L (ref 5–34)
Albumin: 4 g/dL (ref 3.5–5.0)
Alkaline Phosphatase: 57 U/L (ref 40–150)
Anion Gap: 8 mEq/L (ref 3–11)
BUN: 16.5 mg/dL (ref 7.0–26.0)
CO2: 25 mEq/L (ref 22–29)
Calcium: 9.8 mg/dL (ref 8.4–10.4)
Chloride: 106 mEq/L (ref 98–109)
Creatinine: 0.9 mg/dL (ref 0.6–1.1)
EGFR: >90 mL/min/{1.73_m2} (ref >90)
Glucose: 104 mg/dl (ref 70–140)
Potassium: 3.6 mEq/L (ref 3.5–5.1)
Sodium: 140 mEq/L (ref 136–145)
Total Bilirubin: 0.38 mg/dL (ref 0.20–1.20)
Total Protein: 7.7 g/dL (ref 6.4–8.3)

## 2016-04-17 LAB — CBC WITH DIFFERENTIAL/PLATELET
BASO%: 0.2 % (ref 0.0–2.0)
Basophils Absolute: 0 10*3/uL (ref 0.0–0.1)
EOS%: 2.9 % (ref 0.0–7.0)
Eosinophils Absolute: 0.2 10*3/uL (ref 0.0–0.5)
HCT: 25.8 % — ABNORMAL LOW (ref 34.8–46.6)
HGB: 8.7 g/dL — ABNORMAL LOW (ref 11.6–15.9)
LYMPH%: 18 % (ref 14.0–49.7)
MCH: 32.3 pg (ref 25.1–34.0)
MCHC: 33.7 g/dL (ref 31.5–36.0)
MCV: 95.9 fL (ref 79.5–101.0)
MONO#: 0.5 10*3/uL (ref 0.1–0.9)
MONO%: 8.8 % (ref 0.0–14.0)
NEUT#: 4.1 10*3/uL (ref 1.5–6.5)
NEUT%: 70.1 % (ref 38.4–76.8)
Platelets: 174 10*3/uL (ref 145–400)
RBC: 2.69 10*6/uL — ABNORMAL LOW (ref 3.70–5.45)
RDW: 14.7 % — ABNORMAL HIGH (ref 11.2–14.5)
WBC: 5.9 10*3/uL (ref 3.9–10.3)
lymph#: 1.1 10*3/uL (ref 0.9–3.3)

## 2016-04-17 MED ORDER — SODIUM CHLORIDE 0.9 % IJ SOLN
10.0000 mL | INTRAMUSCULAR | Status: DC | PRN
  Filled 2016-04-17: qty 10

## 2016-04-17 MED ORDER — HEPARIN SOD (PORK) LOCK FLUSH 100 UNIT/ML IV SOLN
500.0000 [IU] | Freq: Once | INTRAVENOUS | Status: AC
  Administered 2016-04-17: 500 [IU] via INTRAVENOUS
  Filled 2016-04-17: qty 5

## 2016-04-17 MED ORDER — HEPARIN SOD (PORK) LOCK FLUSH 100 UNIT/ML IV SOLN
250.0000 [IU] | Freq: Once | INTRAVENOUS | Status: DC | PRN
  Filled 2016-04-17: qty 5

## 2016-04-17 MED ORDER — HEPARIN SOD (PORK) LOCK FLUSH 100 UNIT/ML IV SOLN
500.0000 [IU] | Freq: Once | INTRAVENOUS | Status: DC | PRN
  Filled 2016-04-17: qty 5

## 2016-04-17 MED ORDER — HYDROCODONE-ACETAMINOPHEN 5-325 MG PO TABS: 1.0000 | ORAL_TABLET | Freq: Three times a day (TID) | ORAL | 0 refills | Status: DC | PRN

## 2016-04-17 MED ORDER — SODIUM CHLORIDE 0.9 % IJ SOLN
3.0000 mL | Freq: Once | INTRAMUSCULAR | Status: DC | PRN
  Filled 2016-04-17: qty 10

## 2016-04-17 MED ORDER — ACETAMINOPHEN 325 MG PO TABS
650.0000 mg | ORAL_TABLET | Freq: Once | ORAL | Status: AC
  Administered 2016-04-17: 650 mg via ORAL

## 2016-04-17 MED ORDER — SODIUM CHLORIDE 0.9% FLUSH
10.0000 mL | INTRAVENOUS | Status: DC | PRN
  Filled 2016-04-17: qty 10

## 2016-04-17 MED ORDER — SODIUM CHLORIDE 0.9 % IV SOLN
Freq: Once | INTRAVENOUS | Status: AC
  Administered 2016-04-17: 12:00:00 via INTRAVENOUS

## 2016-04-17 MED ORDER — ACETAMINOPHEN 325 MG PO TABS
ORAL_TABLET | ORAL | Status: AC
  Filled 2016-04-17: qty 2

## 2016-04-17 MED ORDER — DIPHENHYDRAMINE HCL 25 MG PO CAPS
50.0000 mg | ORAL_CAPSULE | Freq: Once | ORAL | Status: AC
  Administered 2016-04-17: 50 mg via ORAL

## 2016-04-17 MED ORDER — DIPHENHYDRAMINE HCL 25 MG PO CAPS
ORAL_CAPSULE | ORAL | Status: AC
  Filled 2016-04-17: qty 1

## 2016-04-17 MED ORDER — SODIUM CHLORIDE 0.9 % IV SOLN
6.0000 mg/kg | Freq: Once | INTRAVENOUS | Status: AC
  Administered 2016-04-17: 420 mg via INTRAVENOUS
  Filled 2016-04-17: qty 20

## 2016-04-17 MED ORDER — ALTEPLASE 2 MG IJ SOLR
2.0000 mg | Freq: Once | INTRAMUSCULAR | Status: DC | PRN
  Filled 2016-04-17: qty 2

## 2016-04-17 MED ORDER — SODIUM CHLORIDE 0.9% FLUSH
10.0000 mL | INTRAVENOUS | Status: DC | PRN
  Administered 2016-04-17: 10 mL
  Filled 2016-04-17: qty 10

## 2016-04-17 MED ORDER — SODIUM CHLORIDE 0.9 % IV SOLN: Freq: Once | INTRAVENOUS | Status: DC

## 2016-04-17 MED FILL — HYDROCODON-APAP 5-325: 5-325 | 6 days supply | Qty: 20 | Fill #0

## 2016-04-17 NOTE — Patient Instructions (Signed)
[facility] Discharge Instructions for Patients Receiving Chemotherapy  Today you received the following chemotherapy agents Herceptin  To help prevent nausea and vomiting after your treatment, we encourage you to take your nausea medication as needed   If you develop nausea and vomiting that is not controlled by your nausea medication, call the clinic.   BELOW ARE SYMPTOMS THAT SHOULD BE REPORTED IMMEDIATELY:  *FEVER GREATER THAN 100.5 F  *CHILLS WITH OR WITHOUT FEVER  NAUSEA AND VOMITING THAT IS NOT CONTROLLED WITH YOUR NAUSEA MEDICATION  *UNUSUAL SHORTNESS OF BREATH  *UNUSUAL BRUISING OR BLEEDING  TENDERNESS IN MOUTH AND THROAT WITH OR WITHOUT PRESENCE OF ULCERS  *URINARY PROBLEMS  *BOWEL PROBLEMS  UNUSUAL RASH Items with * indicate a potential emergency and should be followed up as soon as possible.  Feel free to call the clinic you have any questions or concerns. The clinic phone number is [phone].  Please show the CHEMO ALERT CARD at check-in to the Emergency Department and triage nurse.   

## 2016-04-17 NOTE — Progress Notes (Signed)
   Weekly Management Note:  Outpatient    ICD-9-CM ICD-10-CM   1. Malignant neoplasm of lower-outer quadrant of right breast of female, estrogen receptor positive ([facility]) 174.5 C50.511    V86.0 Z17.0     Current Dose:  47 Gy  Projected Dose: 61 Gy   Narrative:  The patient presents for routine under treatment assessment.  CBCT/MVCT images/Port film x-rays were reviewed.  The chart was checked.   Weight stable. BP elevated Reports cardiologist increased Norvasc from 5 to 10 mg last week. Reports fatigue. Hyperpigmentation of the right breast noted by nursing. Reports using Biafine and neosporin on affected breast.  Physical Findings:  weight is 159 lb (72.1 kg). Her blood pressure is 139/97 (abnormal) and her pulse is 98. Her respiration is 18 and oxygen saturation is 100%.   Wt Readings from Last 3 Encounters:  04/17/16 159 lb (72.1 kg)  04/10/16 157 lb 12.8 oz (71.6 kg)  04/10/16 158 lb 6.4 oz (71.8 kg)   Diffuse dryness and hyperpigmentation throughout the right chest and at the inframammary fold there is some superficial moist desquamation with no sign of infection. Dry desquamation that is resolving in the right axilla.   Impression:  The patient is tolerating radiotherapy.  Plan:  Continue radiotherapy as planned. She will continue using Biafine / neosporin as directed.  ________________________________   Eppie Gibson, M.D.   This document serves as a record of services personally performed by Eppie Gibson, MD. It was created on her behalf by Arlyce Harman, a trained medical scribe. The creation of this record is based on the scribe's personal observations and the provider's statements to them. This document has been checked and approved by the attending provider.

## 2016-04-17 NOTE — Progress Notes (Signed)
[facility]  Telephone:([phone] Fax:([phone]) [phone]  Clinic Follow Up Note   Patient Care Team: Renaye Rakers, MD as PCP - General (Family Medicine) Glenna Fellows, MD as Consulting Physician (General Surgery) Lurline Hare, MD as Consulting Physician (Radiation Oncology) 04/17/2016  CHIEF COMPLAINTS:  Follow up right breast cancer  Oncology History   Breast cancer of lower-outer quadrant of right female breast [facility])   Staging form: Breast, AJCC 7th Edition     Clinical stage from 08/03/2015: Stage IA (T1c, N0, M0) - Unsigned     Pathologic stage from 09/29/2015: Stage IIIA (T3(m), N1a, cM0) - Signed by Malachy Mood, MD on 10/17/2015         Breast cancer of lower-outer quadrant of right female breast (HCC)   07/25/2015 Mammogram    diagnostic mammogram and ultrasound showed a 11 mm mass in the 8:00 position of the right breast, small irregular lesion in the retroareolar regio of the right breast, contiguous with the dermis      07/26/2015 Initial Diagnosis    Breast cancer of lower-outer quadrant of right female breast (HCC)      07/26/2015 Initial Biopsy    right breast needle core biopsy at 8:00 position (1), invasive ductal carcinoma, grade 3, 6:30 o'clock position invasive (2) and in situ ductal carcinoma, grade 1-2, lymphovascular invasion is identified      07/26/2015 Receptors her2    (1) ER 95% positive, PR 95% positive, Ki-67 30%, HER-2 positive with ratio 2.04, copy # 5.20, (2) ER 90% positive, PR 95% positive, Ki-67 10%, HER-2 negative.      08/02/2015 Imaging    breast MRI showed extensive mass  and non-mass enhancement in the lower outer quadrant of the right breast  spanning 13 cm, focal enhancement within the right nipple, at least 3-4 abnormal appearing right axillary lymph node.      09/29/2015 Surgery    right mastectomy and axillar node dissection       09/29/2015 Pathology Results    Right mastectomy and axillary node dissection showed  multifocal mammary carcinoma with ductal and lobular features, tumor size 13 cm, tubal 1 cm, 1.9 cm, 2.5 cm, grade 2-3, anterior margins were positive, 2 nodes with metastasis and one with isolated cells      11/01/2015 -  Chemotherapy    TCHP every 3 weeks, for 6 cycles, followed by herceptin maintenance therapy for a total of one year       03/07/2016 -  Radiation Therapy    Adjuvant breast radiation        HISTORY OF PRESENTING ILLNESS (08/03/2015):  Melanie Moss 40 y.o. female is here because of Her newly diagnosed right breast cancer. She is accompanied by her fianc, sister and mother to our multidisciplinary breast clinic today.  She felt a breast lump and noticed mild intermittent breast pain 2 weeks ago, no nipple discharge or skin change. She also has moderate fatigue and dyspnea on moderate to heavy exertion for a few months, no weight loss, no other pain or other symptoms.  She had normal mammgram 6 years ago, for a small lump which felt to be benign and no biopsy was done.    She has iregular period, twice a month, it lasts about 5-7 days, heavy for 3 days, she chanes 6-7 times a day. She was found to be anemic recently, and has started taking oral iron pill a few weeks ago, tolerates well.  GYN HISTORY  Menarchal: 10  LMP:  07/29/2015 Contraceptive: no  HRT: n/a  G4P1: 3 miscarrage, one 22 yo son, no plan to have more children   CURRENT THERAPY: Maintenance Herceptin, every 3 weeks, started on 03/06/2016, adjuvant radiation   INTERIM HISTORY: Ashlee returns for follow-up and maintenance Herceptin treatment. She is tolerating radiation well overall, does have some skin breakdown from radiation, mild pain, and mild to moderate fatigue, she denies chest pain, or other new symptoms. She is able to function well at home.  MEDICAL HISTORY:  Past Medical History:  Diagnosis Date  . Anemia   . Anxiety   . Breast cancer of lower-outer quadrant of right female breast ([place])  07/29/2015  . Family history of breast cancer   . Family history of colon cancer   . Family history of prostate cancer   . GERD (gastroesophageal reflux disease)    tums   . Gestational diabetes   . Hyperlipidemia   . Hypertension    not currently on medication  . Preterm labor     SURGICAL HISTORY: Past Surgical History:  Procedure Laterality Date  . AXILLARY LYMPH NODE DISSECTION Right 09/29/2015   Procedure: AXILLARY LYMPH NODE DISSECTION;  Surgeon: Excell Seltzer, MD;  Location: [place];  Service: General;  Laterality: Right;  . BREAST RECONSTRUCTION WITH PLACEMENT OF TISSUE EXPANDER AND FLEX HD (ACELLULAR HYDRATED DERMIS) Right 09/29/2015   Procedure: BREAST RECONSTRUCTION WITH PLACEMENT OF TISSUE EXPANDER AND FLEX HD (ACELLULAR HYDRATED DERMIS);  Surgeon: Loel Lofty Dillingham, DO;  Location: [place];  Service: Plastics;  Laterality: Right;  . CERVICAL CERCLAGE  02/19/2011   Procedure: CERCLAGE CERVICAL;  Surgeon: Agnes Lawrence, MD;  Location: [facility];  Service: Gynecology;  Laterality: N/A;  . DILATION AND CURETTAGE OF UTERUS    . MASTECTOMY WITH AXILLARY LYMPH NODE DISSECTION Right 09/29/2015  . PORTACATH PLACEMENT N/A 09/29/2015   Procedure: INSERTION PORT-A-CATH;  Surgeon: Excell Seltzer, MD;  Location: [place];  Service: General;  Laterality: N/A;  . SIMPLE MASTECTOMY WITH AXILLARY SENTINEL NODE BIOPSY Right 09/29/2015   Procedure: RIGHT TOTAL  MASTECTOMY WITH AXILLARY SENTINEL NODE BIOPSY;  Surgeon: Excell Seltzer, MD;  Location: [place];  Service: General;  Laterality: Right;    SOCIAL HISTORY: Social History   Social History  . Marital status: Single    Spouse name: N/A  . Number of children: 1  . Years of education: N/A   Occupational History  . Not on file.   Social History Main Topics  . Smoking status: Never Smoker  . Smokeless tobacco: Never Used  . Alcohol use No  . Drug use: No  . Sexual activity: Yes   Other Topics Concern  . Not on file   Social  History Narrative  . No narrative on file    FAMILY HISTORY: Family History  Problem Relation Age of Onset  . Hypertension Mother   . Hypertension Father   . Prostate cancer Father 19  . Breast cancer Maternal Aunt 84  . Breast cancer Paternal Aunt 50  . Cirrhosis Maternal Uncle   . Prostate cancer Paternal Uncle   . Throat cancer Maternal Aunt     non smoker  . Prostate cancer Paternal Uncle   . Colon cancer Cousin     maternal first cousin dx in his 21s  . Lung cancer Cousin     smoker    ALLERGIES:  has No Known Allergies.  MEDICATIONS:  Current Outpatient Prescriptions  Medication Sig Dispense Refill  . amLODipine (NORVASC) 10 MG tablet  Take 1 tablet (10 mg total) by mouth at bedtime. 30 tablet 3  . B Complex-C (SUPER B COMPLEX PO) Take 1 tablet by mouth daily.    . diphenhydrAMINE (BENADRYL) 12.5 MG/5ML elixir Take 5 mLs (12.5 mg total) by mouth every 6 (six) hours as needed for itching. 120 mL 0  . Elastic Bandages & Supports (KNEE COMPRESSION SLEEVE/L/XL) MISC   0  . emollient (BIAFINE) cream Apply topically as needed.    . ferrous sulfate 325 (65 FE) MG tablet Take 325 mg by mouth 2 (two) times daily with a meal.    . hyaluronate sodium (RADIAPLEXRX) GEL Apply 1 application topically 2 (two) times daily.    . hydrocortisone 2.5 % cream Apply topically 2 (two) times daily. 30 g 1  . ibuprofen (ADVIL,MOTRIN) 200 MG tablet Take 200 mg by mouth every 6 (six) hours as needed.    Marland Kitchen LORazepam (ATIVAN) 0.5 MG tablet Take 1 tablet (0.5 mg total) by mouth at bedtime as needed for anxiety (nausea). 30 tablet 0  . Multiple Vitamin (MULTIVITAMIN) tablet Take 1 tablet by mouth daily.    . non-metallic deodorant Jethro Poling) MISC Apply 1 application topically daily as needed.    . ondansetron (ZOFRAN-ODT) 4 MG disintegrating tablet Take 4 mg by mouth every 8 (eight) hours as needed for nausea or vomiting. Reported on 11/01/2015  0  . polyethylene glycol (MIRALAX / GLYCOLAX) packet Take 17  g by mouth daily as needed for mild constipation. 14 each 0  . Potassium Chloride ER 20 MEQ TBCR Take 20 mEq by mouth daily. 30 tablet 1  . senna (SENOKOT) 8.6 MG TABS tablet Take 1 tablet (8.6 mg total) by mouth 2 (two) times daily. (Patient taking differently: Take 1 tablet by mouth daily as needed. ) 120 each 0  . valsartan-hydrochlorothiazide (DIOVAN-HCT) 160-12.5 MG tablet Take 1 tablet by mouth every morning.  0  . HYDROcodone-acetaminophen (NORCO/VICODIN) 5-325 MG tablet Take 1 tablet by mouth every 8 (eight) hours as needed for moderate pain. 20 tablet 0   No current facility-administered medications for this visit.    Facility-Administered Medications Ordered in Other Visits  Medication Dose Route Frequency Provider Last Rate Last Dose  . 0.9 %  sodium chloride infusion   Intravenous Once Truitt Merle, MD      . alteplase (CATHFLO ACTIVASE) injection 2 mg  2 mg Intracatheter Once PRN Truitt Merle, MD      . heparin lock flush 100 unit/mL  250 Units Intracatheter Once PRN Truitt Merle, MD      . heparin lock flush 100 unit/mL  500 Units Intravenous Once PRN Truitt Merle, MD      . sodium chloride 0.9 % injection 10 mL  10 mL Intracatheter PRN Truitt Merle, MD      . sodium chloride 0.9 % injection 10 mL  10 mL Intravenous PRN Truitt Merle, MD      . sodium chloride 0.9 % injection 3 mL  3 mL Intravenous Once PRN Truitt Merle, MD      . sodium chloride flush (NS) 0.9 % injection 10 mL  10 mL Intravenous PRN Truitt Merle, MD        REVIEW OF SYSTEMS:   Constitutional: Denies fevers, chills or abnormal night sweats Eyes: Denies blurriness of vision, double vision or watery eyes Ears, nose, mouth, throat, and face: Denies mucositis or sore throat Respiratory: Denies cough, dyspnea or wheezes Cardiovascular: Denies palpitation, chest discomfort or lower extremity swelling Gastrointestinal:  Denies  nausea, heartburn or change in bowel habits Skin: Denies abnormal skin rashes Lymphatics: Denies new lymphadenopathy or  easy bruising Neurological:Denies numbness, tingling or new weaknesses Behavioral/Psych: Mood is stable, no new changes  All other systems were reviewed with the patient and are negative.  PHYSICAL EXAMINATION: ECOG PERFORMANCE STATUS: 1   Vitals:   04/17/16 0946  BP: (!) 141/81  Pulse: (!) 102  Resp: 18  Temp: 98.2 F (36.8 C)   Filed Weights   04/17/16 0946  Weight: 158 lb 6.4 oz (71.8 kg)    GENERAL:alert, no distress and comfortable SKIN: skin color, texture, turgor are normal, Several scattered acne-like rash in her upper front chest.  EYES: normal, conjunctiva are pink and non-injected, sclera clear OROPHARYNX:no exudate, no erythema and lips, buccal mucosa, and tongue normal  NECK: supple, thyroid normal size, non-tender, without nodularity LYMPH:  no palpable lymphadenopathy in the cervical, axillary or inguinal LUNGS: clear to auscultation and percussion with normal breathing effort HEART: regular rate & rhythm and no murmurs and no lower extremity edema ABDOMEN:abdomen soft, non-tender and normal bowel sounds Musculoskeletal:no cyanosis of digits and no clubbing  PSYCH: alert & oriented x 3 with fluent speech NEURO: no focal motor/sensory deficits Breasts: Breast inspection showed them to be symmetrical with no skin change or nipple discharge.  Status post right mastectomy and tissue expander placement, surgical scar has healed well. (+) Diffuse skin erythema and pigmentation of the right breast secondary to radiation, multiple shallow skin ulcers at the lower right breast, no discharge. Palpation of the left breast and bilateral axilla showed no palpable mass.  LABORATORY DATA:  I have reviewed the data as listed CBC Latest Ref Rng & Units 04/17/2016 03/27/2016 03/06/2016  WBC 3.9 - 10.3 10e3/uL 5.9 6.6 5.4  Hemoglobin 11.6 - 15.9 g/dL 8.7(L) 9.7(L) 7.1(L)  Hematocrit 34.8 - 46.6 % 25.8(L) 28.8(L) 22.1(L)  Platelets 145 - 400 10e3/uL 174 189 136(L)    CMP Latest  Ref Rng & Units 04/17/2016 03/27/2016 03/06/2016  Glucose 70 - 140 mg/dl 104 98 97  BUN 7.0 - 26.0 mg/dL 16.5 16.0 12.3  Creatinine 0.6 - 1.1 mg/dL 0.9 0.9 0.9  Sodium 136 - 145 mEq/L 140 141 142  Potassium 3.5 - 5.1 mEq/L 3.6 3.5 3.4(L)  Chloride 101 - 111 mmol/L - - -  CO2 22 - 29 mEq/L '25 26 25  '$ Calcium 8.4 - 10.4 mg/dL 9.8 10.1 9.7  Total Protein 6.4 - 8.3 g/dL 7.7 7.8 7.2  Total Bilirubin 0.20 - 1.20 mg/dL 0.38 0.56 0.26  Alkaline Phos 40 - 150 U/L 57 64 58  AST 5 - 34 U/L '24 29 25  '$ ALT 0 - 55 U/L '17 18 14    '$ PATHOLOGY: Diagnosis 09/29/2015 1. Lymph node, sentinel, biopsy, Right axillary #1 - ONE BENIGN LYMPH NODE WITH NO TUMOR SEEN (0/1). 2. Lymph node, sentinel, biopsy, Right axillary #2 - ONE LYMPH NODE POSITIVE FOR METASTATIC MAMMARY CARCINOMA (1/1). 3. Lymph node, sentinel, biopsy, Right axillary #4 - ONE BENIGN LYMPH NODE WITH NO TUMOR SEEN (0/1). 4. 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 5. 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 - LARGEST FOCUS OF INVASIVE MAMMARY CARCINOMA IS GRADE 2, SHOWS ASSOCIATED LOBULAR CARCINOMA IN SITU AND MEASURES 13 CM IN GREATEST DIMENSION. - SECOND LARGEST FOCUS SHOWS INVASIVE GRADE 3 MAMMARY CARCINOMA AND SPANS 2.1 CM IN GREATEST DIMENSION. -  THIRD LARGEST FOCUS SHOWS INVASIVE GRADE 2 MAMMARY CARCINOMA WITH LOBULAR CARCINOMA IN SITU AND MEASURES 1.9 CM IN GREATEST DIMENSION. - SMALLEST FOCUS SHOWS INVASIVE GRADE 3 MAMMARY CARCINOMA AND MEASURES 0.5 CM IN GREATEST DIMENSION. - ANTERIOR INFERIOR SOFT TISSUE MARGIN DEMONSTRATES BROAD POSITIVITY FOR INVASIVE MAMMARY CARCINOMA. - OTHER MARGINS ARE NEGATIVE. - SEE ONCOLOGY TEMPLATE. 6. Lymph node, sentinel, biopsy, Right axillary #3 - ONE BENIGN LYMPH NODE WITH NO TUMOR SEEN (0/1). 7. Lymph node, sentinel, biopsy, Right  axillary #6 - ONE LYMPH NODE WITH ISOLATED TUMOR CELLS. 8. Lymph nodes, regional resection, Right axillary contents - ELEVEN BENIGN LYMPH NODES WITH NO TUMOR SEEN (0/11). Specimen, including laterality and lymph node sampling (sentinel, non-sentinel): Right breast with right sentinel and non-sentinel lymph nodes. Procedure: Right modified mastectomy with sentinel lymph node biopsy and axillary dissection. Histologic type: Multifocal mammary carcinoma with ductal and lobular features. Largest focus (13 cm): Grade: 2. Tubule formation: 3. Nuclear pleomorphism: 2. Mitotic: 1. Second largest focus (2.1 cm): Grade: 3. Tubule formation: 3. Nuclear pleomorphism: 2. Mitotic: 3. Third largest focus (1.9 cm): Grade: 2. Tubule formation: 3. Nuclear pleomorphism: 2. Mitotic: 1. Smallest focus (0.5 cm): Grade: 3. Tubule formation: 3. Nuclear pleomorphism: 2. Mitotic: 3. Tumor size (gross measurements): 13 cm, 2.1 cm, 1.9 cm and 0.5 cm. Margins: Invasive, distance to closest margin: Invasive mammary carcinoma broadly involves the anterior inferior soft tissue margin. In-situ, distance to closest margin: Margin uninvolved by in situ carcinoma. If margin positive, focally or broadly: Broadly positive. Lymphovascular invasion: Yes, identified. Ductal carcinoma in situ: Not identified. Grade: Not applicable. Extensive intraductal component: Not applicable. Lobular neoplasia: Yes, lobular carcinoma in situ is present. Tumor focality: Multifocal. Treatment effect: Not applicable. If present, treatment effect in breast tissue, lymph nodes or both: Not applicable. Extent of tumor: Skin: Not involved. Nipple: Not involved. Skeletal muscle: Not received. Lymph nodes: Examined: 6 Sentinel. 11 Non-sentinel. 17 Total. Lymph nodes with metastasis: 2 with metastasis and 1 with isolated tumor cells. Isolated tumor cells (< 0.2 mm): 1. Micrometastasis: (> 0.2 mm and < 2.0 mm):  0. Macrometastasis: (> 2.0 mm): 2. Extracapsular extension: Not identified.  Breast prognostic profile: Performed on previous case, [id number]. Right 8 o'clock biopsy 12 cm from the nipple: Estrogen receptor: 95%, positive. Progesterone receptor: 95%, positive. Her-2 neu: 2.04 ratio, positive. Ki-67: 30%. Right breast needle core biopsy 6:30 o'clock position 1 cm from the nipple: Estrogen receptor: 90%, positive. Progesterone receptor: 95%, positive. Her-2 neu: 1.39 ratio, negative. Ki-67: 10%. Non-neoplastic breast: No significant non-neoplastic breast findings.  TNM: pT3(m), pN1a. Comments: A cytokeratin AE1/AE3 immunohistochemical stain is performed on eight blocks containing lymph node tissue (eight stains total). The morphology coupled with the staining pattern is consistent with the above findings. An E-cadherin immunohistochemical stain is also performed on four blocks from the right simple mastectomy specimen. The staining pattern coupled with the morphology is consistent with the above findings. As there was Her-2 neu positivity in one of the biopsies and both biopsies were positive for estrogen receptor, a repeat breast prognostic profile will not be performed unless otherwise requested. Dr. Lyndon Code has seen selected slides 5E, 45F, 5G, 5J, 5N, 5O, and 5P with agreement that the tumor is a mulitfocal invasive mammary carcinoma with a mixed ductal and lobular phenotype. Dr. Tresa Moore has also seen a selected slide (5P) with agreement of the positive anterior inferior margin. (RH:ecj 10/03/2015)   RADIOGRAPHIC STUDIES: I have personally reviewed the radiological images as listed and agreed with the findings in  the report. No new scans  Bone scan 10/28/2015 IMPRESSION: Small focus of increased radiotracer uptake within the right lateral skull at the region of the right temporal bone. CT of the head may be considered for further evaluation.  Otherwise no evidence of osseous  metastatic disease in the axial skeleton.  CT chest, abdomen and pelvis  10/27/2015 IMPRESSION: Status post right mastectomy and right axillary lymph node dissection.  No findings specific for metastatic disease.  Scattered hepatic cysts measuring up to 12 mm. A single 8 mm hypoenhancing lesion is technically indeterminate, but also likely benign, although motion degraded. Consider attention on follow-up.  CT head with and without contrast 11/07/2015 IMPRESSION: 1. Indeterminate 10-11 mm area of heterogeneous bone mineralization along the right sphenoid wing which may correspond to the recent bone scan finding. This might be physiologic. A Repeat CT head (e.g. in 8-12 weeks, Noncontrast should suffice but with and without contrast could be done if repeat brain staging is desired) may be most valuable to evaluate stability. 2. No CT evidence of metastatic disease to the brain. Mild nonspecific white matter changes.  Echo 04/10/2016 Impressions:  - Normal LV size with EF 65-70%. Strain as above. Normal RV size   and systolic function. No significant valvular abnormalities.   ASSESSMENT & PLAN:  40 y.o. African-American female, premenopausal, presented with a palpable right breast mass.  1. 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 Her initial biopsy showed 1 triple positive disease, the other was ER/PR positive and HER2 Negative disease. -Her staging scans were negative for distant metastasis. -We discussed that HER-2 positive breast cancer are more aggressive, with a high risk for recurrence after surgery, especially in the setting of locally advanced stage.  I recommend her to have adjuvant chemotherapy TCHP (docetaxel, carboplatin, Herceptin, pejeta) every 3 weeks for total of 6 cycles, followed by Herceptin maintenance therapy to complete 1 year treatment. -Her baseline echo was normal, which was done in April 2017, she will follow-up with cardiologist to monitor her heart during her herceptin therapy. Last repeated echo on 04/10/2016 was normal. -He has completed adjuvant chemotherapy, and tolerating maintenance Herceptin very well. Will continue every 3 weeks until the end of June 2018. -She is tolerating adjuvant radiation well, has moderate radiation dermatitis and fatigue. -Plan to start adjuvant endocrine therapy after she completes radiation.  2. Iron deficient anemia, and chemo induced anemia  -Her lab tests showed a moderate anemia with hemoglobin 7.8, low MCV 62.7, her serum iron level and transferrin saturation on low, ferritin 25, this is consistent with iron deficient anemia, likely secondary to her menorrhagia. -Her anemia has resolved after IV Feraheme, slightly worse after she started chemotherapy. -I encouraged her to continue oral iron pill -Her repeated on level and a ferritin have normalized last month.  -She has required blood transfusion -Hemoglobin 8.7 today, we'll continue monitoring  3. Genetic -Given her young age and positive family history, we strongly recommend her to [facility] genetic counseling for genetic testing to ruled out inheritable breast cancer syndrome. She agrees. -Her genetic testing was normal  4.  HTN -She was noticed to have significant elevated blood pressure lately  -She has seen her primary care physician, and her blood pressure  medication has been adjusted, including HCTZ  5. Right arm lymphedema  -Secondary to breast surgery and lymph node biopsy -improved    Plan -continue maintenance Herceptin today and every 3 weeks -She will complete adjuvant breast radiation on 04/27/2016 -I'll see her back  in 3 weeks, and discussed adjuvant antiestrogen therapy.  All questions were answered. The patient and her family members agree with the above plan. The patient knows to call the clinic with any problems, questions or concerns.  I spent 20 minutes counseling the patient face to face. The total time spent in the appointment was 25 minutes and more than 50% was on counseling.     Truitt Merle, MD 04/17/2016

## 2016-04-17 NOTE — Progress Notes (Signed)
Weight stable. BP elevated. Reports cardiologist increased Norvasc from 5 to 10 mg last week. Reports fatigue. Hyperpigmentation of right breast noted. Dry desquamation right mammary fold noted. Reports using Biafine and neosporin on affected breast.   BP (!) 139/97 (BP Location: Left Arm, Patient Position: Sitting, Cuff Size: Normal)   Pulse 98   Resp 18   Wt 159 lb (72.1 kg)   SpO2 100%   BMI 28.17 kg/m  Wt Readings from Last 3 Encounters:  04/17/16 159 lb (72.1 kg)  04/10/16 157 lb 12.8 oz (71.6 kg)  04/10/16 158 lb 6.4 oz (71.8 kg)

## 2016-04-18 ENCOUNTER — Ambulatory Visit
Admission: RE | Admit: 2016-04-18 | Discharge: 2016-04-18 | Disposition: A | Discharge status: Home / Self Care | Payer: BLUE CROSS/BLUE SHIELD | Source: Ambulatory Visit | Attending: Radiation Oncology | Admitting: Radiation Oncology

## 2016-04-18 DIAGNOSIS — Z510 Encounter for antineoplastic radiation therapy: Secondary | ICD-10-CM | POA: Diagnosis not present

## 2016-04-19 ENCOUNTER — Ambulatory Visit: Payer: BLUE CROSS/BLUE SHIELD

## 2016-04-19 ENCOUNTER — Ambulatory Visit
Admission: RE | Admit: 2016-04-19 | Discharge: 2016-04-19 | Disposition: A | Discharge status: Home / Self Care | Payer: BLUE CROSS/BLUE SHIELD | Source: Ambulatory Visit | Attending: Radiation Oncology | Admitting: Radiation Oncology

## 2016-04-19 DIAGNOSIS — Z510 Encounter for antineoplastic radiation therapy: Secondary | ICD-10-CM | POA: Diagnosis not present

## 2016-04-20 ENCOUNTER — Ambulatory Visit
Admission: RE | Admit: 2016-04-20 | Discharge: 2016-04-20 | Disposition: A | Discharge status: Home / Self Care | Payer: BLUE CROSS/BLUE SHIELD | Source: Ambulatory Visit | Attending: Radiation Oncology | Admitting: Radiation Oncology

## 2016-04-20 ENCOUNTER — Ambulatory Visit: Payer: BLUE CROSS/BLUE SHIELD

## 2016-04-20 DIAGNOSIS — Z510 Encounter for antineoplastic radiation therapy: Secondary | ICD-10-CM | POA: Diagnosis not present

## 2016-04-24 ENCOUNTER — Ambulatory Visit
Admission: RE | Admit: 2016-04-24 | Discharge: 2016-04-24 | Disposition: A | Discharge status: Home / Self Care | Payer: BLUE CROSS/BLUE SHIELD | Source: Ambulatory Visit | Attending: Radiation Oncology | Admitting: Radiation Oncology

## 2016-04-24 ENCOUNTER — Encounter: Payer: Self-pay | Admitting: Radiation Oncology

## 2016-04-24 VITALS — BP 128/85 | HR 101 | Temp 98.1°F | Ht 63.0 in | Wt 159.0 lb

## 2016-04-24 DIAGNOSIS — C50511 Malignant neoplasm of lower-outer quadrant of right female breast: Secondary | ICD-10-CM

## 2016-04-24 DIAGNOSIS — Z9011 Acquired absence of right breast and nipple: Secondary | ICD-10-CM | POA: Diagnosis not present

## 2016-04-24 DIAGNOSIS — Z170 Estrogen receptor positive status [ER+]: Secondary | ICD-10-CM | POA: Insufficient documentation

## 2016-04-24 DIAGNOSIS — Z510 Encounter for antineoplastic radiation therapy: Secondary | ICD-10-CM | POA: Diagnosis not present

## 2016-04-24 DIAGNOSIS — Z8042 Family history of malignant neoplasm of prostate: Secondary | ICD-10-CM | POA: Insufficient documentation

## 2016-04-24 DIAGNOSIS — K219 Gastro-esophageal reflux disease without esophagitis: Secondary | ICD-10-CM | POA: Insufficient documentation

## 2016-04-24 DIAGNOSIS — E785 Hyperlipidemia, unspecified: Secondary | ICD-10-CM | POA: Insufficient documentation

## 2016-04-24 DIAGNOSIS — Z803 Family history of malignant neoplasm of breast: Secondary | ICD-10-CM | POA: Diagnosis not present

## 2016-04-24 DIAGNOSIS — Z800 Family history of malignant neoplasm of digestive organs: Secondary | ICD-10-CM | POA: Diagnosis not present

## 2016-04-24 DIAGNOSIS — Z801 Family history of malignant neoplasm of trachea, bronchus and lung: Secondary | ICD-10-CM | POA: Insufficient documentation

## 2016-04-24 DIAGNOSIS — D649 Anemia, unspecified: Secondary | ICD-10-CM | POA: Diagnosis not present

## 2016-04-24 DIAGNOSIS — Z9221 Personal history of antineoplastic chemotherapy: Secondary | ICD-10-CM | POA: Diagnosis not present

## 2016-04-24 NOTE — Progress Notes (Signed)
Melanie Moss has completed 30 fractions to her right chest wall.  She denies having pain.  She denies having an increase in fatigue.  She is using biafine and radiaplex.  The skin on her right subclavian, shoulder and chest has hyperpigmentation with peeling. She is using neosporin on the peeling areas.   BP 128/85 (BP Location: Left Arm, Patient Position: Sitting)   Pulse (!) 101   Temp 98.1 F (36.7 C) (Oral)   Ht 5\' 3"  (1.6 m)   Wt 159 lb (72.1 kg)   SpO2 100%   BMI 28.17 kg/m    Wt Readings from Last 3 Encounters:  04/24/16 159 lb (72.1 kg)  04/17/16 159 lb (72.1 kg)  04/17/16 158 lb 6.4 oz (71.8 kg)

## 2016-04-24 NOTE — Progress Notes (Signed)
  Radiation Oncology         (336) [phone] ________________________________  Name: NASHEKA NJIE MRN: [id number]  Date: 04/24/2016  DOB: 1975/10/20  Weekly Radiation Therapy Management    ICD-9-CM ICD-10-CM   1. Malignant neoplasm of lower-outer quadrant of right breast of female, estrogen receptor positive ([place]) 174.5 C50.511    V86.0 Z17.0       Current Dose: 55 Gy     Planned Dose:  61 Gy  Narrative . . . . . . . . The patient presents for routine under treatment assessment.                                    Justyn Leupold has completed 30 fractions to her right chest wall.  She denies having pain at this time, but took [place] for back pain a few days ago. She denies having an increase in fatigue.  She is using biafine and radiaplex.  The skin on her right subclavian, shoulder and chest has hyperpigmentation with peeling. She is using neosporin on the peeling areas.                                   Set-up films were reviewed.                                 The chart was checked. Physical Findings. . .  height is 5\' 3"  (1.6 m) and weight is 159 lb (72.1 kg). Her oral temperature is 98.1 F (36.7 C). Her blood pressure is 128/85 and her pulse is 101 (abnormal). Her oxygen saturation is 100%. . Weight essentially stable.  No significant changes. Cardiopulmonary assessment is negative for acute distress and she exhibits normal effort. Lungs clear to auscultation bilaterally. Hyperpigmentation changes and dry desquamation, no moist desquamation, in the right chest area. Impression . . . . . . . The patient is tolerating radiation. Plan . . . . . . . . . . . . Continue treatment as planned. ________________________________    Blair Promise, PhD, MD  This document serves as a record of services personally performed by [name], MD. It was created on his behalf by Darcus Austin, a trained medical scribe. The creation of this record is based on the scribe's personal observations and the  provider's statements to them. This document has been checked and approved by the attending provider.

## 2016-04-25 ENCOUNTER — Ambulatory Visit
Admission: RE | Admit: 2016-04-25 | Discharge: 2016-04-25 | Disposition: A | Discharge status: Home / Self Care | Payer: BLUE CROSS/BLUE SHIELD | Source: Ambulatory Visit | Attending: Radiation Oncology | Admitting: Radiation Oncology

## 2016-04-25 DIAGNOSIS — Z510 Encounter for antineoplastic radiation therapy: Secondary | ICD-10-CM | POA: Diagnosis not present

## 2016-04-26 ENCOUNTER — Ambulatory Visit
Admission: RE | Admit: 2016-04-26 | Discharge: 2016-04-26 | Disposition: A | Discharge status: Home / Self Care | Payer: BLUE CROSS/BLUE SHIELD | Source: Ambulatory Visit | Attending: Radiation Oncology | Admitting: Radiation Oncology

## 2016-04-26 ENCOUNTER — Ambulatory Visit: Payer: BLUE CROSS/BLUE SHIELD

## 2016-04-26 DIAGNOSIS — Z510 Encounter for antineoplastic radiation therapy: Secondary | ICD-10-CM | POA: Diagnosis not present

## 2016-04-27 ENCOUNTER — Ambulatory Visit: Payer: BLUE CROSS/BLUE SHIELD

## 2016-04-27 ENCOUNTER — Encounter: Payer: Self-pay | Admitting: Radiation Oncology

## 2016-04-27 ENCOUNTER — Ambulatory Visit
Admission: RE | Admit: 2016-04-27 | Discharge: 2016-04-27 | Disposition: A | Discharge status: Home / Self Care | Payer: BLUE CROSS/BLUE SHIELD | Source: Ambulatory Visit | Attending: Radiation Oncology | Admitting: Radiation Oncology

## 2016-04-27 ENCOUNTER — Encounter: Payer: Self-pay | Admitting: *Deleted

## 2016-04-27 VITALS — BP 133/91 | HR 94 | Temp 97.7°F | Ht 63.0 in | Wt 158.4 lb

## 2016-04-27 DIAGNOSIS — Z510 Encounter for antineoplastic radiation therapy: Secondary | ICD-10-CM | POA: Diagnosis not present

## 2016-04-27 DIAGNOSIS — Z170 Estrogen receptor positive status [ER+]: Secondary | ICD-10-CM

## 2016-04-27 DIAGNOSIS — C50511 Malignant neoplasm of lower-outer quadrant of right female breast: Secondary | ICD-10-CM

## 2016-04-27 NOTE — Progress Notes (Signed)
Melanie Moss has completed treatment with 33 fractions to her right chest wall.  She reports having some soreness/pain in her right chest today due to her expander.  She also reports having numbness and tingling in her hands and is wondering if it is from Herceptin.  She reports having fatigue.  She is using biafine and radiaplex.  The skin on her right chest has hyperpigmentation.  She has been given a one month follow up appointment.  BP (!) 133/91 (BP Location: Left Arm, Patient Position: Sitting)   Pulse 94   Temp 97.7 F (36.5 C) (Oral)   Ht 5\' 3"  (1.6 m)   Wt 158 lb 6.4 oz (71.8 kg)   SpO2 100%   BMI 28.06 kg/m    Wt Readings from Last 3 Encounters:  04/27/16 158 lb 6.4 oz (71.8 kg)  04/24/16 159 lb (72.1 kg)  04/17/16 159 lb (72.1 kg)

## 2016-04-27 NOTE — Progress Notes (Signed)
  Radiation Oncology         [phone] ________________________________  Name: Melanie Moss MRN: [id number]  Date: 04/27/2016  DOB: October 06, 1975  Weekly Radiation Therapy Management    ICD-9-CM ICD-10-CM   1. Malignant neoplasm of lower-outer quadrant of right breast of female, estrogen receptor positive (Melanie Moss) 174.5 C50.511    V86.0 Z17.0       Current Dose: 61 Gy     Planned Dose:  61 Gy  Narrative . . . . . . . . The patient presents for routine under treatment assessment.                                    Melanie Moss has completed 33 fractions to her right chest wall.  She reports soreness/pain in her right chest due to her expander. She also reports numbness and tingling in her hands and she is wondering if it is from Herceptin. She reports fatigue. She is using Biafine and Radiaplex. Per nursing, the skin on her right chest has hyperpigmentation.                                   Set-up films were reviewed.                                 The chart was checked. Physical Findings. . .  height is 5\' 3"  (1.6 m) and weight is 158 lb 6.4 oz (71.8 kg). Her oral temperature is 97.7 F (36.5 C). Her blood pressure is 133/91 (abnormal) and her pulse is 94. Her oxygen saturation is 100%. . Weight essentially stable.  No significant changes. Cardiopulmonary assessment is negative for acute distress and she exhibits normal effort. Lungs clear to auscultation bilaterally. Hyperpigmentation changes and dry desquamation, no moist desquamation, in the right chest area. Impression . . . . . . . The patient has tolerated radiation. Plan . . . . . . . . . . . . One month follow up card was given. ________________________________    Blair Promise, PhD, MD  This document serves as a record of services personally performed by [name], MD. It was created on his behalf by Bethann Humble, a trained medical scribe. The creation of this record is based on the scribe's personal observations and the  provider's statements to them. This document has been checked and approved by the attending provider.

## 2016-04-30 ENCOUNTER — Encounter: Payer: Self-pay | Admitting: Radiation Oncology

## 2016-04-30 NOTE — Progress Notes (Signed)
  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  During treatment, the patient reported pain in her right chest due to her expander, and neuropathy in her hands. She developed significant hyperpigmentation changes in the treatment area but no moist desquamation at the completion of treatment.  Plan: The patient has completed radiation treatment. The patient will return to radiation oncology clinic for routine followup in one month. I advised them to call or return sooner if they have any questions or concerns related to their recovery or treatment.  -----------------------------------  JBlair Promise PhD, MD  This document serves as a record of services personally performed by JGery Pray MD. It was created on his behalf by LBethann Humble a trained medical scribe. The creation of this record is based on the scribe's personal observations and the provider's statements to them. This document has been checked and approved by the attending provider.

## 2016-05-09 ENCOUNTER — Telehealth: Payer: Self-pay | Admitting: Hematology

## 2016-05-09 NOTE — Telephone Encounter (Signed)
Pt called to cxl appts due to weather. Pt to be called for r/s date/time

## 2016-05-09 NOTE — Telephone Encounter (Signed)
Pt called to r/s appts due to weather conditions. Gave pt new appt date/time

## 2016-05-10 ENCOUNTER — Encounter: Payer: Self-pay | Admitting: *Deleted

## 2016-05-10 ENCOUNTER — Other Ambulatory Visit: Payer: BLUE CROSS/BLUE SHIELD

## 2016-05-10 ENCOUNTER — Ambulatory Visit: Payer: BLUE CROSS/BLUE SHIELD

## 2016-05-10 ENCOUNTER — Ambulatory Visit: Payer: BLUE CROSS/BLUE SHIELD | Admitting: Hematology

## 2016-05-11 ENCOUNTER — Telehealth: Payer: Self-pay

## 2016-05-11 NOTE — Progress Notes (Signed)
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 breast [facility])   Staging form: Breast, AJCC 7th Edition     Clinical stage from 08/03/2015: Stage IA (T1c, N0, M0) - Unsigned     Pathologic stage from 09/29/2015: Stage IIIA (T3(m), N1a, cM0) - Signed by Truitt Merle, MD on 10/17/2015         Breast cancer of lower-outer quadrant of right female breast ([place])   07/25/2015 Mammogram    diagnostic mammogram and ultrasound showed a 11 mm mass in the 8:00 position of the right breast, small irregular lesion in the retroareolar regio of the right breast, contiguous with the dermis      07/26/2015 Initial Diagnosis    Breast cancer of lower-outer quadrant of right female breast ([place])      07/26/2015 Initial Biopsy    right breast needle core biopsy at 8:00 position (1), invasive ductal carcinoma, grade 3, 6:30 o'clock position invasive (2) and in situ ductal carcinoma, grade 1-2, lymphovascular invasion is identified      07/26/2015 Receptors her2    (1) ER 95% positive, PR 95% positive, Ki-67 30%, HER-2 positive with ratio 2.04, copy # 5.20, (2) ER 90% positive, PR 95% positive, Ki-67 10%, HER-2 negative.      08/02/2015 Imaging    breast MRI showed extensive mass  and non-mass enhancement in the lower outer quadrant of the right breast  spanning 13 cm, focal enhancement within the right nipple, at least 3-4 abnormal appearing right axillary lymph node.      09/29/2015 Surgery    right mastectomy and axillar node dissection       09/29/2015 Pathology Results    Right mastectomy and axillary node dissection showed  multifocal mammary carcinoma with ductal and lobular features, tumor size 13 cm, tubal 1 cm, 1.9 cm, 2.5 cm, grade 2-3, anterior margins were positive, 2 nodes with metastasis and one with isolated cells      11/01/2015 -  Chemotherapy    TCHP every 3 weeks, for 6 cycles, followed by herceptin maintenance therapy for a total of one year       03/07/2016 - 04/27/2016 Radiation Therapy    03/07/16-04/27/16 Site/dose: 1) 91fd right chest wall/ 45 Gy in 25 fractions 2) Right mastectomy scar / 16 Gy in 8 fractions       HISTORY OF PRESENTING ILLNESS (08/03/2015):  Melanie MRAYCHELLE HUDMAN89y.o. female is here because of Her newly diagnosed right breast cancer. She is accompanied by her fianc, sister and mother to our multidisciplinary breast clinic today.  She felt a breast lump and noticed mild intermittent breast pain 2 weeks ago, no nipple discharge or skin change. She also has moderate fatigue and dyspnea on moderate to heavy exertion for a few months, no weight loss, no other pain or other symptoms.  She had normal mammgram 6 years ago, for a small lump which felt to be benign and no biopsy was done.    She has iregular period, twice a month, it lasts about 5-7 days, heavy for 3 days, she chanes 6-7 times a day. She was found to be anemic recently, and has started  taking oral iron pill a few weeks ago, tolerates well.  GYN HISTORY  Menarchal: 10  LMP: 07/29/2015 Contraceptive: no  HRT: n/a  G4P1: 3 miscarrage, one 33 yo son, no plan to have more children   CURRENT THERAPY: Maintenance Herceptin, every 3 weeks, started on 03/06/2016. Tamoxifen 20 mg starting 05/21/16.  INTERIM HISTORY: Skylene returns for follow-up and maintenance Herceptin treatment. The patient reports a cold since last Wednesday. Reports thick congestion. Denies fever or chest pain. She reports taking OTC medication for this. Reports her fingers ache and that it has improved since completing radiation. The patient states she  has not had a period since starting chemotherapy. Reports a lot of hot flashes with more occurring at night. She also reports mood swings.  MEDICAL HISTORY:  Past Medical History:  Diagnosis Date  . Anemia   . Anxiety   . Breast cancer of lower-outer quadrant of right female breast ([place]) 07/29/2015  . Family history of breast cancer   . Family history of colon cancer   . Family history of prostate cancer   . GERD (gastroesophageal reflux disease)    tums   . Gestational diabetes   . Hyperlipidemia   . Hypertension    not currently on medication  . Preterm labor     SURGICAL HISTORY: Past Surgical History:  Procedure Laterality Date  . AXILLARY LYMPH NODE DISSECTION Right 09/29/2015   Procedure: AXILLARY LYMPH NODE DISSECTION;  Surgeon: Excell Seltzer, MD;  Location: [place];  Service: General;  Laterality: Right;  . BREAST RECONSTRUCTION WITH PLACEMENT OF TISSUE EXPANDER AND FLEX HD (ACELLULAR HYDRATED DERMIS) Right 09/29/2015   Procedure: BREAST RECONSTRUCTION WITH PLACEMENT OF TISSUE EXPANDER AND FLEX HD (ACELLULAR HYDRATED DERMIS);  Surgeon: Loel Lofty Dillingham, DO;  Location: [place];  Service: Plastics;  Laterality: Right;  . CERVICAL CERCLAGE  02/19/2011   Procedure: CERCLAGE CERVICAL;  Surgeon: Agnes Lawrence, MD;  Location: [facility];  Service: Gynecology;  Laterality: N/A;  . DILATION AND CURETTAGE OF UTERUS    . MASTECTOMY WITH AXILLARY LYMPH NODE DISSECTION Right 09/29/2015  . PORTACATH PLACEMENT N/A 09/29/2015   Procedure: INSERTION PORT-A-CATH;  Surgeon: Excell Seltzer, MD;  Location: [place];  Service: General;  Laterality: N/A;  . SIMPLE MASTECTOMY WITH AXILLARY SENTINEL NODE BIOPSY Right 09/29/2015   Procedure: RIGHT TOTAL  MASTECTOMY WITH AXILLARY SENTINEL NODE BIOPSY;  Surgeon: Excell Seltzer, MD;  Location: [place];  Service: General;  Laterality: Right;    SOCIAL HISTORY: Social History   Social History  . Marital status: Single    Spouse name: N/A  . Number  of children: 1  . Years of education: N/A   Occupational History  . Not on file.   Social History Main Topics  . Smoking status: Never Smoker  . Smokeless tobacco: Never Used  . Alcohol use No  . Drug use: No  . Sexual activity: Yes   Other Topics Concern  . Not on file   Social History Narrative  . No narrative on file    FAMILY HISTORY: Family History  Problem Relation Age of Onset  . Hypertension Mother   . Hypertension Father   . Prostate cancer Father 90  . Breast cancer Maternal Aunt 41  . Breast cancer Paternal Aunt 47  . Cirrhosis Maternal Uncle   . Prostate cancer Paternal Uncle   . Throat cancer Maternal Aunt     non smoker  . Prostate cancer Paternal Uncle   . Colon cancer Cousin  maternal first cousin dx in his 40s  . Lung cancer Cousin     smoker    ALLERGIES:  has No Known Allergies.  MEDICATIONS:  Current Outpatient Prescriptions  Medication Sig Dispense Refill  . amLODipine (NORVASC) 10 MG tablet Take 1 tablet (10 mg total) by mouth at bedtime. 30 tablet 3  . B Complex-C (SUPER B COMPLEX PO) Take 1 tablet by mouth daily.    . diphenhydrAMINE (BENADRYL) 12.5 MG/5ML elixir Take 5 mLs (12.5 mg total) by mouth every 6 (six) hours as needed for itching. 120 mL 0  . Elastic Bandages & Supports (KNEE COMPRESSION SLEEVE/L/XL) MISC   0  . emollient (BIAFINE) cream Apply topically as needed.    . ferrous sulfate 325 (65 FE) MG tablet Take 325 mg by mouth 2 (two) times daily with a meal.    . hyaluronate sodium (RADIAPLEXRX) GEL Apply 1 application topically 2 (two) times daily.    Marland Kitchen HYDROcodone-acetaminophen (NORCO/VICODIN) 5-325 MG tablet Take 1 tablet by mouth every 8 (eight) hours as needed for moderate pain. 20 tablet 0  . hydrocortisone 2.5 % cream Apply topically 2 (two) times daily. 30 g 1  . ibuprofen (ADVIL,MOTRIN) 200 MG tablet Take 200 mg by mouth every 6 (six) hours as needed.    Marland Kitchen LORazepam (ATIVAN) 0.5 MG tablet Take 1 tablet (0.5 mg total)  by mouth at bedtime as needed for anxiety (nausea). 30 tablet 0  . Multiple Vitamin (MULTIVITAMIN) tablet Take 1 tablet by mouth daily.    . non-metallic deodorant Jethro Poling) MISC Apply 1 application topically daily as needed.    . ondansetron (ZOFRAN-ODT) 4 MG disintegrating tablet Take 4 mg by mouth every 8 (eight) hours as needed for nausea or vomiting. Reported on 11/01/2015  0  . polyethylene glycol (MIRALAX / GLYCOLAX) packet Take 17 g by mouth daily as needed for mild constipation. 14 each 0  . Potassium Chloride ER 20 MEQ TBCR Take 20 mEq by mouth daily. 30 tablet 1  . senna (SENOKOT) 8.6 MG TABS tablet Take 1 tablet (8.6 mg total) by mouth 2 (two) times daily. (Patient taking differently: Take 1 tablet by mouth daily as needed. ) 120 each 0  . tamoxifen (NOLVADEX) 20 MG tablet Take 1 tablet (20 mg total) by mouth daily. 30 tablet 2  . valsartan-hydrochlorothiazide (DIOVAN-HCT) 160-12.5 MG tablet Take 1 tablet by mouth every morning.  0  . venlafaxine XR (EFFEXOR XR) 37.5 MG 24 hr capsule Take 1 capsule (37.5 mg total) by mouth daily with breakfast. 30 capsule 1   No current facility-administered medications for this visit.    Facility-Administered Medications Ordered in Other Visits  Medication Dose Route Frequency Provider Last Rate Last Dose  . 0.9 %  sodium chloride infusion   Intravenous Once Truitt Merle, MD      . alteplase (CATHFLO ACTIVASE) injection 2 mg  2 mg Intracatheter Once PRN Truitt Merle, MD      . heparin lock flush 100 unit/mL  250 Units Intracatheter Once PRN Truitt Merle, MD      . heparin lock flush 100 unit/mL  500 Units Intravenous Once PRN Truitt Merle, MD      . sodium chloride 0.9 % injection 10 mL  10 mL Intracatheter PRN Truitt Merle, MD      . sodium chloride 0.9 % injection 10 mL  10 mL Intravenous PRN Truitt Merle, MD      . sodium chloride 0.9 % injection 3 mL  3 mL  Intravenous Once PRN Truitt Merle, MD      . sodium chloride flush (NS) 0.9 % injection 10 mL  10 mL Intravenous PRN Truitt Merle, MD        REVIEW OF SYSTEMS:   Constitutional: Denies fevers, chills or abnormal night sweats (+) Hot flashes Eyes: Denies blurriness of vision, double vision or watery eyes Ears, nose, mouth, throat, and face: Denies mucositis or sore throat Respiratory: Denies cough, dyspnea or wheezes (+) Congestion Cardiovascular: Denies palpitation, chest discomfort or lower extremity swelling Gastrointestinal:  Denies nausea, heartburn or change in bowel habits Skin: Denies abnormal skin rashes Lymphatics: Denies new lymphadenopathy or easy bruising Neurological:Denies numbness, tingling or new weaknesses (+) Fingers ache Behavioral/Psych: Mood is stable, no new changes  All other systems were reviewed with the patient and are negative.  PHYSICAL EXAMINATION: ECOG PERFORMANCE STATUS: 1   Vitals:   05/14/16 1103  BP: (!) 142/83  Pulse: 95  Resp: 18  Temp: 98.2 F (36.8 C)   Filed Weights   05/14/16 1103  Weight: 156 lb 4.8 oz (70.9 kg)    GENERAL:alert, no distress and comfortable SKIN: skin color, texture, turgor are normal, Several scattered acne-like rash in her upper front chest.  EYES: normal, conjunctiva are pink and non-injected, sclera clear OROPHARYNX:no exudate, no erythema and lips, buccal mucosa, and tongue normal  NECK: supple, thyroid normal size, non-tender, without nodularity LYMPH:  no palpable lymphadenopathy in the cervical, axillary or inguinal LUNGS: clear to auscultation and percussion with normal breathing effort HEART: regular rate & rhythm and no murmurs and no lower extremity edema ABDOMEN:abdomen soft, non-tender and normal bowel sounds Musculoskeletal:no cyanosis of digits and no clubbing  PSYCH: alert & oriented x 3 with fluent speech NEURO: no focal motor/sensory deficits Breasts: Breast inspection showed them to be symmetrical with no skin change or nipple discharge.  Status post right mastectomy and tissue expander placement, surgical scar has  healed well. (+) Diffuse skin pigmentation of the right breast secondary to radiation, no skin ulcers or discharge. Palpation of the left breast and bilateral axilla showed no palpable mass.  LABORATORY DATA:  I have reviewed the data as listed CBC Latest Ref Rng & Units 05/14/2016 04/17/2016 03/27/2016  WBC 3.9 - 10.3 10e3/uL 8.4 5.9 6.6  Hemoglobin 11.6 - 15.9 g/dL 9.6(L) 8.7(L) 9.7(L)  Hematocrit 34.8 - 46.6 % 27.6(L) 25.8(L) 28.8(L)  Platelets 145 - 400 10e3/uL 211 174 189    CMP Latest Ref Rng & Units 05/14/2016 04/17/2016 03/27/2016  Glucose 70 - 140 mg/dl 103 104 98  BUN 7.0 - 26.0 mg/dL 13.4 16.5 16.0  Creatinine 0.6 - 1.1 mg/dL 1.0 0.9 0.9  Sodium 136 - 145 mEq/L 140 140 141  Potassium 3.5 - 5.1 mEq/L 3.5 3.6 3.5  Chloride 101 - 111 mmol/L - - -  CO2 22 - 29 mEq/L _0 Calcium 8.4 - 10.4 mg/dL 10.0 9.8 10.1  Total Protein 6.4 - 8.3 g/dL 7.8 7.7 7.8  Total Bilirubin 0.20 - 1.20 mg/dL 0.28 0.38 0.56  Alkaline Phos 40 - 150 U/L 70 57 64  AST 5 - 34 U/L _1 ALT 0 - 55 U/L _2 PATHOLOGY: Diagnosis 09/29/2015 1. Lymph node, sentinel, biopsy, Right axillary #1 - ONE BENIGN LYMPH NODE WITH NO TUMOR SEEN (0/1). 2. Lymph node, sentinel, biopsy, Right axillary #2 - ONE LYMPH NODE POSITIVE FOR METASTATIC MAMMARY CARCINOMA (1/1). 3. Lymph node, sentinel, biopsy, Right axillary #4 -  ONE BENIGN LYMPH NODE WITH NO TUMOR SEEN (0/1). 4. 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 5. 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 - LARGEST FOCUS OF INVASIVE MAMMARY CARCINOMA IS GRADE 2, SHOWS ASSOCIATED LOBULAR CARCINOMA IN SITU AND MEASURES 13 CM IN GREATEST DIMENSION. - SECOND LARGEST FOCUS SHOWS INVASIVE GRADE 3 MAMMARY CARCINOMA AND SPANS 2.1 CM IN GREATEST DIMENSION. - THIRD LARGEST FOCUS SHOWS INVASIVE GRADE 2  MAMMARY CARCINOMA WITH LOBULAR CARCINOMA IN SITU AND MEASURES 1.9 CM IN GREATEST DIMENSION. - SMALLEST FOCUS SHOWS INVASIVE GRADE 3 MAMMARY CARCINOMA AND MEASURES 0.5 CM IN GREATEST DIMENSION. - ANTERIOR INFERIOR SOFT TISSUE MARGIN DEMONSTRATES BROAD POSITIVITY FOR INVASIVE MAMMARY CARCINOMA. - OTHER MARGINS ARE NEGATIVE. - SEE ONCOLOGY TEMPLATE. 6. Lymph node, sentinel, biopsy, Right axillary #3 - ONE BENIGN LYMPH NODE WITH NO TUMOR SEEN (0/1). 7. Lymph node, sentinel, biopsy, Right axillary #6 - ONE LYMPH NODE WITH ISOLATED TUMOR CELLS. 8. Lymph nodes, regional resection, Right axillary contents - ELEVEN BENIGN LYMPH NODES WITH NO TUMOR SEEN (0/11). Specimen, including laterality and lymph node sampling (sentinel, non-sentinel): Right breast with right sentinel and non-sentinel lymph nodes. Procedure: Right modified mastectomy with sentinel lymph node biopsy and axillary dissection. Histologic type: Multifocal mammary carcinoma with ductal and lobular features. Largest focus (13 cm): Grade: 2. Tubule formation: 3. Nuclear pleomorphism: 2. Mitotic: 1. Second largest focus (2.1 cm): Grade: 3. Tubule formation: 3. Nuclear pleomorphism: 2. Mitotic: 3. Third largest focus (1.9 cm): Grade: 2. Tubule formation: 3. Nuclear pleomorphism: 2. Mitotic: 1. Smallest focus (0.5 cm): Grade: 3. Tubule formation: 3. Nuclear pleomorphism: 2. Mitotic: 3. Tumor size (gross measurements): 13 cm, 2.1 cm, 1.9 cm and 0.5 cm. Margins: Invasive, distance to closest margin: Invasive mammary carcinoma broadly involves the anterior inferior soft tissue margin. In-situ, distance to closest margin: Margin uninvolved by in situ carcinoma. If margin positive, focally or broadly: Broadly positive. Lymphovascular invasion: Yes, identified. Ductal carcinoma in situ: Not identified. Grade: Not applicable. Extensive intraductal component: Not applicable. Lobular neoplasia: Yes, lobular carcinoma in situ  is present. Tumor focality: Multifocal. Treatment effect: Not applicable. If present, treatment effect in breast tissue, lymph nodes or both: Not applicable. Extent of tumor: Skin: Not involved. Nipple: Not involved. Skeletal muscle: Not received. Lymph nodes: Examined: 6 Sentinel. 11 Non-sentinel. 17 Total. Lymph nodes with metastasis: 2 with metastasis and 1 with isolated tumor cells. Isolated tumor cells (< 0.2 mm): 1. Micrometastasis: (> 0.2 mm and < 2.0 mm): 0. Macrometastasis: (> 2.0 mm): 2. Extracapsular extension: Not identified.  Breast prognostic profile: Performed on previous case, [id number]. Right 8 o'clock biopsy 12 cm from the nipple: Estrogen receptor: 95%, positive. Progesterone receptor: 95%, positive. Her-2 neu: 2.04 ratio, positive. Ki-67: 30%. Right breast needle core biopsy 6:30 o'clock position 1 cm from the nipple: Estrogen receptor: 90%, positive. Progesterone receptor: 95%, positive. Her-2 neu: 1.39 ratio, negative. Ki-67: 10%. Non-neoplastic breast: No significant non-neoplastic breast findings.  TNM: pT3(m), pN1a. Comments: A cytokeratin AE1/AE3 immunohistochemical stain is performed on eight blocks containing lymph node tissue (eight stains total). The morphology coupled with the staining pattern is consistent with the above findings. An E-cadherin immunohistochemical stain is also performed on four blocks from the right simple mastectomy specimen. The staining pattern coupled with the morphology is consistent with the above findings. As there was Her-2 neu positivity in one of the biopsies and both biopsies were  positive for estrogen receptor, a repeat breast prognostic profile will not be performed unless otherwise requested. Dr. Lyndon Code has seen selected slides 5E, 62F, 5G, 5J, 5N, 5O, and 5P with agreement that the tumor is a mulitfocal invasive mammary carcinoma with a mixed ductal and lobular phenotype. Dr. Tresa Moore has also seen a selected slide  (5P) with agreement of the positive anterior inferior margin. (RH:ecj 10/03/2015)   RADIOGRAPHIC STUDIES: I have personally reviewed the radiological images as listed and agreed with the findings in the report. No new scans  CT chest, abdomen and pelvis  10/27/2015 IMPRESSION: Status post right mastectomy and right axillary lymph node dissection.  No findings specific for metastatic disease.  Scattered hepatic cysts measuring up to 12 mm. A single 8 mm hypoenhancing lesion is technically indeterminate, but also likely benign, although motion degraded. Consider attention on follow-up.  Bone scan 10/28/2015 IMPRESSION: Small focus of increased radiotracer uptake within the right lateral skull at the region of the right temporal bone. CT of the head may be considered for further evaluation.  Otherwise no evidence of osseous metastatic disease in the axial skeleton.  CT head with and without contrast 11/07/2015 IMPRESSION: 1. Indeterminate 10-11 mm area of heterogeneous bone mineralization along the right sphenoid wing which may correspond to the recent bone scan finding. This might be physiologic. A Repeat CT head (e.g. in 8-12 weeks, Noncontrast should suffice but with and without contrast could be done if repeat brain staging is desired) may be most valuable to evaluate stability. 2. No CT evidence of metastatic disease to the brain. Mild nonspecific white matter changes.  Echo 04/10/2016 Impressions:  - Normal LV size with EF 65-70%. Strain as above. Normal RV size   and systolic function. No significant valvular abnormalities.   ASSESSMENT & PLAN:  41 y.o. African-American female, premenopausal, presented with a palpable right breast mass.  1. 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 Her initial biopsy showed 1 triple positive disease, the other was ER/PR positive and HER2 Negative disease. -Her staging scans were negative for distant metastasis. -We discussed that HER-2 positive breast cancer are more aggressive, with a high risk for recurrence after surgery, especially in the setting of locally advanced stage. I recommend her to have adjuvant chemotherapy TCHP (docetaxel, carboplatin, Herceptin, pejeta) every 3 weeks for total of 6 cycles, followed by Herceptin maintenance therapy to complete 1 year treatment. -Pejeta has been recently approved for maintenance adjuvant therapy for high-risk HER-2 positive disease. I'll start her pejeta with next cycle of Herceptin. She tolerated pejeta well in the adjuvant chemotherapy setting. -Her baseline echo was normal, which was done in April 2017, she will follow-up with cardiologist to monitor her heart during her herceptin therapy. Last repeated echo on 04/10/2016 was normal. -She completed adjuvant radiation therapy by Dr. Sondra Come on 04/27/2016. Tolerated well. --Giving her strongly ER and PR positivity of the tumor cells, and her pre-menopause status, I recommend adjuvant endocrine therapy with tamoxifen. The potential side effects, which includes but not limited to, hot flash, skin and vaginal dryness, slightly increased risk of cardiovascular disease and cataract, small risk of thrombosis and endometrial cancer, were discussed with her in great details. Preventive strategies for thrombosis, such as  being physically active, using compression stocks, avoid cigarette smoking, etc., were reviewed with her. I also recommend her to follow-up with her gynecologist once a year, and watch for vaginal spotting or bleeding, as a clinically sign of endometrial cancer, etc. She voiced good understanding, and agrees to  proceed. Will start in a few weeks  -I discussed alternative adjuvant endocrine therapy with aromatase inhibitor and ovarian suppression with her also, given her young age and . Due to her locally advanced disease, I strongly encouraged her to consider this. Potential side effects of overexpression and aromatase inhibitor were discussed with her in details. She is interested.  -She has not had menstrual periods since she started chemotherapy, and is currently experiencing hot flashes and mood swing. I'll start her on Effexor first. If she is able to tolerate tamoxifen well, I will start on Zoladex in a few months.   2. Iron deficient anemia, and chemo induced anemia  -Her lab tests showed a moderate anemia with hemoglobin 7.8, low MCV 62.7, her serum iron level and transferrin saturation on low, ferritin 25, this is consistent with iron deficient anemia, likely secondary to her menorrhagia. -Her anemia has resolved after IV Feraheme, slightly worse after she started chemotherapy. -I encouraged her to continue oral iron pill -Her repeated on level and a ferritin have normalized last month.  -She has required blood transfusion -Hemoglobin 9.6 TODAY, we'll continue monitoring  3. Genetic -Given her young age and positive family history, we strongly recommend her to [facility] genetic counseling for genetic testing to ruled out inheritable breast cancer syndrome. She agrees. -Her genetic testing was normal  4.  HTN -She was noticed to have significant elevated blood pressure lately  -She has seen her primary care physician, and her blood pressure medication has been adjusted, including HCTZ  5. Right arm lymphedema  -Secondary to breast surgery and lymph node biopsy -improved   6. Hot flashes and mood swings. -We discussed Neurontin helping with her hot flashes, but the main side effect is drowsiness. -We discussed Effexor XR for helping with hot flashes and mood swings. -I have prescribed Effexor  XR.  Plan -continue maintenance Herceptin today and every 3 weeks, we'll add pejeta from next cycle -Lab, f/u, Herceptin, and Perjeta in 3 and 6 weeks. -I have filled Effexor and Tamoxifen today. She will start Effexor this week and then start Tamoxifen in 1 week. -Echocardiogram on 07/09/16. -I'll see her back in 3 weeks  All questions were answered. The patient and her family members agree with the above plan. The patient knows to call the clinic with any problems, questions or concerns.  I spent 20 minutes counseling the patient face to face. The total time spent in the appointment was 25 minutes and more than 50% was on counseling.     Truitt Merle, MD 05/14/2016    This document serves as a record of services personally performed by Truitt Merle, MD. It was created on her behalf by Darcus Austin, a trained medical scribe. The creation of this record is based on the scribe's personal observations and the provider's statements to them. This document has been checked and approved by the attending provider.

## 2016-05-11 NOTE — Telephone Encounter (Signed)
Pt asking what cold remedies to avoid d/t her HTN. She cannot rememeber what Dr Burr Medico mentioned. Instructed pt to look for packaging with "HBP" for high blood pressure on them or ask the pharmacist to assist her in selection.

## 2016-05-14 ENCOUNTER — Ambulatory Visit (HOSPITAL_BASED_OUTPATIENT_CLINIC_OR_DEPARTMENT_OTHER): Payer: BLUE CROSS/BLUE SHIELD

## 2016-05-14 ENCOUNTER — Ambulatory Visit: Payer: BLUE CROSS/BLUE SHIELD

## 2016-05-14 ENCOUNTER — Telehealth: Payer: Self-pay | Admitting: Hematology

## 2016-05-14 ENCOUNTER — Encounter: Payer: Self-pay | Admitting: Hematology

## 2016-05-14 ENCOUNTER — Other Ambulatory Visit (HOSPITAL_BASED_OUTPATIENT_CLINIC_OR_DEPARTMENT_OTHER): Payer: BLUE CROSS/BLUE SHIELD

## 2016-05-14 ENCOUNTER — Ambulatory Visit (HOSPITAL_BASED_OUTPATIENT_CLINIC_OR_DEPARTMENT_OTHER): Payer: BLUE CROSS/BLUE SHIELD | Admitting: Hematology

## 2016-05-14 ENCOUNTER — Telehealth: Payer: Self-pay | Admitting: *Deleted

## 2016-05-14 VITALS — BP 142/83 | HR 95 | Temp 98.2°F | Resp 18 | Ht 63.0 in | Wt 156.3 lb

## 2016-05-14 DIAGNOSIS — D500 Iron deficiency anemia secondary to blood loss (chronic): Secondary | ICD-10-CM

## 2016-05-14 DIAGNOSIS — D509 Iron deficiency anemia, unspecified: Secondary | ICD-10-CM | POA: Diagnosis not present

## 2016-05-14 DIAGNOSIS — Z170 Estrogen receptor positive status [ER+]: Principal | ICD-10-CM

## 2016-05-14 DIAGNOSIS — C50511 Malignant neoplasm of lower-outer quadrant of right female breast: Secondary | ICD-10-CM | POA: Diagnosis not present

## 2016-05-14 DIAGNOSIS — D6481 Anemia due to antineoplastic chemotherapy: Secondary | ICD-10-CM

## 2016-05-14 DIAGNOSIS — I10 Essential (primary) hypertension: Secondary | ICD-10-CM

## 2016-05-14 DIAGNOSIS — Z5112 Encounter for antineoplastic immunotherapy: Secondary | ICD-10-CM

## 2016-05-14 DIAGNOSIS — C773 Secondary and unspecified malignant neoplasm of axilla and upper limb lymph nodes: Secondary | ICD-10-CM

## 2016-05-14 DIAGNOSIS — I890 Lymphedema, not elsewhere classified: Secondary | ICD-10-CM

## 2016-05-14 LAB — COMPREHENSIVE METABOLIC PANEL
ALT: 19 U/L (ref 0–55)
AST: 24 U/L (ref 5–34)
Albumin: 3.9 g/dL (ref 3.5–5.0)
Alkaline Phosphatase: 70 U/L (ref 40–150)
Anion Gap: 9 mEq/L (ref 3–11)
BUN: 13.4 mg/dL (ref 7.0–26.0)
CO2: 28 mEq/L (ref 22–29)
Calcium: 10 mg/dL (ref 8.4–10.4)
Chloride: 103 mEq/L (ref 98–109)
Creatinine: 1 mg/dL (ref 0.6–1.1)
EGFR: 82 mL/min/{1.73_m2} — ABNORMAL LOW (ref >90)
Glucose: 103 mg/dl (ref 70–140)
Potassium: 3.5 mEq/L (ref 3.5–5.1)
Sodium: 140 mEq/L (ref 136–145)
Total Bilirubin: 0.28 mg/dL (ref 0.20–1.20)
Total Protein: 7.8 g/dL (ref 6.4–8.3)

## 2016-05-14 LAB — CBC WITH DIFFERENTIAL/PLATELET
BASO%: 0.4 % (ref 0.0–2.0)
Basophils Absolute: 0 10*3/uL (ref 0.0–0.1)
EOS%: 2.4 % (ref 0.0–7.0)
Eosinophils Absolute: 0.2 10*3/uL (ref 0.0–0.5)
HCT: 27.6 % — ABNORMAL LOW (ref 34.8–46.6)
HGB: 9.6 g/dL — ABNORMAL LOW (ref 11.6–15.9)
LYMPH%: 27.2 % (ref 14.0–49.7)
MCH: 33.3 pg (ref 25.1–34.0)
MCHC: 34.9 g/dL (ref 31.5–36.0)
MCV: 95.4 fL (ref 79.5–101.0)
MONO#: 0.7 10*3/uL (ref 0.1–0.9)
MONO%: 8.6 % (ref 0.0–14.0)
NEUT#: 5.2 10*3/uL (ref 1.5–6.5)
NEUT%: 61.4 % (ref 38.4–76.8)
Platelets: 211 10*3/uL (ref 145–400)
RBC: 2.9 10*6/uL — ABNORMAL LOW (ref 3.70–5.45)
RDW: 12.5 % (ref 11.2–14.5)
WBC: 8.4 10*3/uL (ref 3.9–10.3)
lymph#: 2.3 10*3/uL (ref 0.9–3.3)

## 2016-05-14 LAB — FERRITIN: Ferritin: 425 ng/ml — ABNORMAL HIGH (ref 9–269)

## 2016-05-14 LAB — IRON AND TIBC
%SAT: 28 % (ref 21–57)
Iron: 64 ug/dL (ref 41–142)
TIBC: 224 ug/dL — ABNORMAL LOW (ref 236–444)
UIBC: 160 ug/dL (ref 120–384)

## 2016-05-14 MED ORDER — VENLAFAXINE HCL ER 37.5 MG PO CP24: 37.5000 mg | ORAL_CAPSULE | Freq: Every day | ORAL | 1 refills | Status: DC

## 2016-05-14 MED ORDER — HEPARIN SOD (PORK) LOCK FLUSH 100 UNIT/ML IV SOLN
500.0000 [IU] | Freq: Once | INTRAVENOUS | Status: AC | PRN
  Administered 2016-05-14: 500 [IU]
  Filled 2016-05-14: qty 5

## 2016-05-14 MED ORDER — SODIUM CHLORIDE 0.9% FLUSH
10.0000 mL | INTRAVENOUS | Status: DC | PRN
  Administered 2016-05-14: 10 mL
  Filled 2016-05-14: qty 10

## 2016-05-14 MED ORDER — SODIUM CHLORIDE 0.9 % IJ SOLN
10.0000 mL | INTRAMUSCULAR | Status: DC | PRN
  Administered 2016-05-14: 10 mL via INTRAVENOUS
  Filled 2016-05-14: qty 10

## 2016-05-14 MED ORDER — DIPHENHYDRAMINE HCL 25 MG PO CAPS
50.0000 mg | ORAL_CAPSULE | Freq: Once | ORAL | Status: AC
  Administered 2016-05-14: 25 mg via ORAL

## 2016-05-14 MED ORDER — ACETAMINOPHEN 325 MG PO TABS
650.0000 mg | ORAL_TABLET | Freq: Once | ORAL | Status: AC
  Administered 2016-05-14: 650 mg via ORAL

## 2016-05-14 MED ORDER — TAMOXIFEN CITRATE 20 MG PO TABS: 20.0000 mg | ORAL_TABLET | Freq: Every day | ORAL | 2 refills | Status: DC

## 2016-05-14 MED ORDER — TRASTUZUMAB CHEMO 150 MG IV SOLR
6.0000 mg/kg | Freq: Once | INTRAVENOUS | Status: AC
  Administered 2016-05-14: 420 mg via INTRAVENOUS
  Filled 2016-05-14: qty 20

## 2016-05-14 MED ORDER — ACETAMINOPHEN 325 MG PO TABS
ORAL_TABLET | ORAL | Status: AC
  Filled 2016-05-14: qty 2

## 2016-05-14 MED ORDER — SODIUM CHLORIDE 0.9 % IV SOLN
Freq: Once | INTRAVENOUS | Status: AC
  Administered 2016-05-14: 12:00:00 via INTRAVENOUS

## 2016-05-14 MED ORDER — DIPHENHYDRAMINE HCL 25 MG PO CAPS
ORAL_CAPSULE | ORAL | Status: AC
  Filled 2016-05-14: qty 1

## 2016-05-14 MED FILL — TAMOXIFEN 20 MG TABLET: 20 | 30 days supply | Qty: 30 | Fill #0

## 2016-05-14 MED FILL — VENLAFAXINE HCL ER 37.5 MG: 37.5 | 30 days supply | Qty: 30 | Fill #0

## 2016-05-14 NOTE — Patient Instructions (Signed)

## 2016-05-14 NOTE — Patient Instructions (Signed)
[facility] Discharge Instructions for Patients Receiving Chemotherapy  Today you received the following chemotherapy agents: Herceptin   To help prevent nausea and vomiting after your treatment, we encourage you to take your nausea medication as directed.    If you develop nausea and vomiting that is not controlled by your nausea medication, call the clinic.   BELOW ARE SYMPTOMS THAT SHOULD BE REPORTED IMMEDIATELY:  *FEVER GREATER THAN 100.5 F  *CHILLS WITH OR WITHOUT FEVER  NAUSEA AND VOMITING THAT IS NOT CONTROLLED WITH YOUR NAUSEA MEDICATION  *UNUSUAL SHORTNESS OF BREATH  *UNUSUAL BRUISING OR BLEEDING  TENDERNESS IN MOUTH AND THROAT WITH OR WITHOUT PRESENCE OF ULCERS  *URINARY PROBLEMS  *BOWEL PROBLEMS  UNUSUAL RASH Items with * indicate a potential emergency and should be followed up as soon as possible.  Feel free to call the clinic you have any questions or concerns. The clinic phone number is [phone].  Please show the CHEMO ALERT CARD at check-in to the Emergency Department and triage nurse.   

## 2016-05-14 NOTE — Progress Notes (Signed)
Patient requested to take 25mg  benadryl vs the ordered 50mg . Consulted with Md.   @1150  Per Thu, Rn, per Dr. Waldon Reining to give 25mg  of benadryl.   Wylene Simmer, BSN, RN 05/14/2016 12:02 PM

## 2016-05-14 NOTE — Telephone Encounter (Signed)
Message sent to chemo scheduler to be added per 05/14/16 los. No follow up appointments available with Dr Burr Medico on 06/04/16, due to provider on PAL, follow-up was scheduled for 05/31/16.  Appointments scheduled per 05/14/16 los. Patient was given a copy of the updated appointment schedule and AVS report per 05/14/16 los.

## 2016-05-14 NOTE — Telephone Encounter (Signed)
Per 1/22 LOS and staff message I have schedueld appts and notified the scheduler

## 2016-05-15 ENCOUNTER — Telehealth: Payer: Self-pay | Admitting: *Deleted

## 2016-05-15 NOTE — Telephone Encounter (Signed)
-----   Message from Truitt Merle, MD sent at 05/15/2016  8:18 AM EST ----- Please let pt know her iron level is good, thanks.  Truitt Merle  05/15/2016

## 2016-05-15 NOTE — Telephone Encounter (Signed)
Called pt and left message on voice mail requesting a call back to nurse re:  Per Dr. Burr Medico, iron level is good.

## 2016-05-15 NOTE — Telephone Encounter (Signed)
"  I just missed a call five minutes ago from Dr. Ernestina Penna nurse."  Provided iron & TIBC and Ferritin results with this call.  No further questions.

## 2016-05-20 MED FILL — AMLODIPINE BESYLATE 10 MG T: 10 | 30 days supply | Qty: 30 | Fill #1

## 2016-05-29 NOTE — Progress Notes (Signed)
[facility]  Telephone:(336) [phone] Fax:(336) [phone]  Clinic Follow Up Note   Patient Care Team: Renaye Rakers, MD as PCP - General (Family Medicine) Glenna Fellows, MD as Consulting Physician (General Surgery) Lurline Hare, MD as Consulting Physician (Radiation Oncology) 05/31/2016  CHIEF COMPLAINTS:  Follow up right breast cancer  Oncology History   Breast cancer of lower-outer quadrant of right female breast [facility])   Staging form: Breast, AJCC 7th Edition     Clinical stage from 08/03/2015: Stage IA (T1c, N0, M0) - Unsigned     Pathologic stage from 09/29/2015: Stage IIIA (T3(m), N1a, cM0) - Signed by Malachy Mood, MD on 10/17/2015         Breast cancer of lower-outer quadrant of right female breast (HCC)   07/25/2015 Mammogram    diagnostic mammogram and ultrasound showed a 11 mm mass in the 8:00 position of the right breast, small irregular lesion in the retroareolar regio of the right breast, contiguous with the dermis      07/26/2015 Initial Diagnosis    Breast cancer of lower-outer quadrant of right female breast (HCC)      07/26/2015 Initial Biopsy    right breast needle core biopsy at 8:00 position (1), invasive ductal carcinoma, grade 3, 6:30 o'clock position invasive (2) and in situ ductal carcinoma, grade 1-2, lymphovascular invasion is identified      07/26/2015 Receptors her2    (1) ER 95% positive, PR 95% positive, Ki-67 30%, HER-2 positive with ratio 2.04, copy # 5.20, (2) ER 90% positive, PR 95% positive, Ki-67 10%, HER-2 negative.      08/02/2015 Imaging    breast MRI showed extensive mass  and non-mass enhancement in the lower outer quadrant of the right breast  spanning 13 cm, focal enhancement within the right nipple, at least 3-4 abnormal appearing right axillary lymph node.      09/29/2015 Surgery    right mastectomy and axillar node dissection       09/29/2015 Pathology Results    Right mastectomy and axillary node dissection showed  multifocal mammary carcinoma with ductal and lobular features, tumor size 13 cm, tubal 1 cm, 1.9 cm, 2.5 cm, grade 2-3, anterior margins were positive, 2 nodes with metastasis and one with isolated cells      11/01/2015 - 02/14/2016 Chemotherapy    TCHP every 3 weeks, for 6 cycles, followed by herceptin maintenance therapy for a total of one year       03/07/2016 - 04/27/2016 Radiation Therapy    03/07/16-04/27/16 Site/dose: 1) 27fld right chest wall/ 45 Gy in 25 fractions 2) Right mastectomy scar / 16 Gy in 8 fractions       HISTORY OF PRESENTING ILLNESS (08/03/2015):  Melanie Moss 41 y.o. female is here because of Her newly diagnosed right breast cancer. She is accompanied by her fianc, sister and mother to our multidisciplinary breast clinic today.  She felt a breast lump and noticed mild intermittent breast pain 2 weeks ago, no nipple discharge or skin change. She also has moderate fatigue and dyspnea on moderate to heavy exertion for a few months, no weight loss, no other pain or other symptoms.  She had normal mammgram 6 years ago, for a small lump which felt to be benign and no biopsy was done.    She has iregular period, twice a month, it lasts about 5-7 days, heavy for 3 days, she chanes 6-7 times a day. She was found to be anemic recently, and has started  taking oral iron pill a few weeks ago, tolerates well.  GYN HISTORY  Menarchal: 10  LMP: 07/29/2015 Contraceptive: no  HRT: n/a  G4P1: 3 miscarrage, one 87 yo son, no plan to have more children   CURRENT THERAPY: Maintenance Herceptin and perjeta, every 3 weeks, started on 03/06/2016. Tamoxifen 20 mg starting 05/21/16.  INTERIM HISTORY: Melanie Moss returns for follow-up and maintenance Herceptin treatment. She is still taking tamoxifen, but she does not like the Effexor. She says it makes her feel loopy. The hot flashes and night sweats are still present. Her hands are starting to get some numbness and pain. Her right hand is also  swollen. Everything is worse in the morning, she can not make a fist at first. Denies joint pain, or any other concerns.  She is done with radiation. She hasn't had a period in 2 months.   MEDICAL HISTORY:  Past Medical History:  Diagnosis Date  . Anemia   . Anxiety   . Breast cancer of lower-outer quadrant of right female breast (HCC) 07/29/2015  . Family history of breast cancer   . Family history of colon cancer   . Family history of prostate cancer   . GERD (gastroesophageal reflux disease)    tums   . Gestational diabetes   . Hyperlipidemia   . Hypertension    not currently on medication  . Preterm labor     SURGICAL HISTORY: Past Surgical History:  Procedure Laterality Date  . AXILLARY LYMPH NODE DISSECTION Right 09/29/2015   Procedure: AXILLARY LYMPH NODE DISSECTION;  Surgeon: Glenna Fellows, MD;  Location: [facility] OR;  Service: General;  Laterality: Right;  . BREAST RECONSTRUCTION WITH PLACEMENT OF TISSUE EXPANDER AND FLEX HD (ACELLULAR HYDRATED DERMIS) Right 09/29/2015   Procedure: BREAST RECONSTRUCTION WITH PLACEMENT OF TISSUE EXPANDER AND FLEX HD (ACELLULAR HYDRATED DERMIS);  Surgeon: Alena Bills Dillingham, DO;  Location: [facility] OR;  Service: Plastics;  Laterality: Right;  . CERVICAL CERCLAGE  02/19/2011   Procedure: CERCLAGE CERVICAL;  Surgeon: Roseanna Rainbow, MD;  Location: [facility] ORS;  Service: Gynecology;  Laterality: N/A;  . DILATION AND CURETTAGE OF UTERUS    . MASTECTOMY WITH AXILLARY LYMPH NODE DISSECTION Right 09/29/2015  . PORTACATH PLACEMENT N/A 09/29/2015   Procedure: INSERTION PORT-A-CATH;  Surgeon: Glenna Fellows, MD;  Location: [facility] OR;  Service: General;  Laterality: N/A;  . SIMPLE MASTECTOMY WITH AXILLARY SENTINEL NODE BIOPSY Right 09/29/2015   Procedure: RIGHT TOTAL  MASTECTOMY WITH AXILLARY SENTINEL NODE BIOPSY;  Surgeon: Glenna Fellows, MD;  Location: [facility] OR;  Service: General;  Laterality: Right;    SOCIAL HISTORY: Social History   Social History  .  Marital status: Single    Spouse name: N/A  . Number of children: 1  . Years of education: N/A   Occupational History  . Not on file.   Social History Main Topics  . Smoking status: Never Smoker  . Smokeless tobacco: Never Used  . Alcohol use No  . Drug use: No  . Sexual activity: Yes   Other Topics Concern  . Not on file   Social History Narrative  . No narrative on file    FAMILY HISTORY: Family History  Problem Relation Age of Onset  . Hypertension Mother   . Hypertension Father   . Prostate cancer Father 58  . Breast cancer Maternal Aunt 40  . Breast cancer Paternal Aunt 37  . Cirrhosis Maternal Uncle   . Prostate cancer Paternal Uncle   . Throat cancer Maternal Aunt  non smoker  . Prostate cancer Paternal Uncle   . Colon cancer Cousin     maternal first cousin dx in his 57s  . Lung cancer Cousin     smoker    ALLERGIES:  has No Known Allergies.  MEDICATIONS:  Current Outpatient Prescriptions  Medication Sig Dispense Refill  . amLODipine (NORVASC) 10 MG tablet Take 1 tablet (10 mg total) by mouth at bedtime. 30 tablet 3  . B Complex-C (SUPER B COMPLEX PO) Take 1 tablet by mouth daily.    . diphenhydrAMINE (BENADRYL) 12.5 MG/5ML elixir Take 5 mLs (12.5 mg total) by mouth every 6 (six) hours as needed for itching. 120 mL 0  . Elastic Bandages & Supports (KNEE COMPRESSION SLEEVE/L/XL) MISC   0  . emollient (BIAFINE) cream Apply topically as needed.    . ferrous sulfate 325 (65 FE) MG tablet Take 325 mg by mouth 2 (two) times daily with a meal.    . hyaluronate sodium (RADIAPLEXRX) GEL Apply 1 application topically 2 (two) times daily.    Marland Kitchen HYDROcodone-acetaminophen (NORCO/VICODIN) 5-325 MG tablet Take 1 tablet by mouth every 8 (eight) hours as needed for moderate pain. 20 tablet 0  . hydrocortisone 2.5 % cream Apply topically 2 (two) times daily. 30 g 1  . ibuprofen (ADVIL,MOTRIN) 200 MG tablet Take 200 mg by mouth every 6 (six) hours as needed.    Marland Kitchen  LORazepam (ATIVAN) 0.5 MG tablet Take 1 tablet (0.5 mg total) by mouth at bedtime as needed for anxiety (nausea). 30 tablet 0  . Multiple Vitamin (MULTIVITAMIN) tablet Take 1 tablet by mouth daily.    . non-metallic deodorant Jethro Poling) MISC Apply 1 application topically daily as needed.    . ondansetron (ZOFRAN-ODT) 4 MG disintegrating tablet Take 4 mg by mouth every 8 (eight) hours as needed for nausea or vomiting. Reported on 11/01/2015  0  . polyethylene glycol (MIRALAX / GLYCOLAX) packet Take 17 g by mouth daily as needed for mild constipation. 14 each 0  . Potassium Chloride ER 20 MEQ TBCR Take 20 mEq by mouth daily. 30 tablet 1  . senna (SENOKOT) 8.6 MG TABS tablet Take 1 tablet (8.6 mg total) by mouth 2 (two) times daily. (Patient taking differently: Take 1 tablet by mouth daily as needed. ) 120 each 0  . tamoxifen (NOLVADEX) 20 MG tablet Take 1 tablet (20 mg total) by mouth daily. 30 tablet 2  . valsartan-hydrochlorothiazide (DIOVAN-HCT) 160-12.5 MG tablet Take 1 tablet by mouth every morning.  0  . gabapentin (NEURONTIN) 100 MG capsule Take 1-2 capsules (100-200 mg total) by mouth at bedtime. 60 capsule 1  . venlafaxine XR (EFFEXOR XR) 37.5 MG 24 hr capsule Take 1 capsule (37.5 mg total) by mouth daily with breakfast. (Patient not taking: Reported on 05/31/2016) 30 capsule 1   No current facility-administered medications for this visit.    Facility-Administered Medications Ordered in Other Visits  Medication Dose Route Frequency Provider Last Rate Last Dose  . 0.9 %  sodium chloride infusion   Intravenous Once Truitt Merle, MD      . alteplase (CATHFLO ACTIVASE) injection 2 mg  2 mg Intracatheter Once PRN Truitt Merle, MD      . heparin lock flush 100 unit/mL  250 Units Intracatheter Once PRN Truitt Merle, MD      . heparin lock flush 100 unit/mL  500 Units Intravenous Once PRN Truitt Merle, MD      . sodium chloride 0.9 % injection 10 mL  10 mL Intracatheter PRN Truitt Merle, MD      . sodium chloride 0.9 %  injection 10 mL  10 mL Intravenous PRN Truitt Merle, MD      . sodium chloride 0.9 % injection 3 mL  3 mL Intravenous Once PRN Truitt Merle, MD      . sodium chloride flush (NS) 0.9 % injection 10 mL  10 mL Intravenous PRN Truitt Merle, MD        REVIEW OF SYSTEMS:   Constitutional: Denies fevers, chills  (+) Hot flashes, night sweats Eyes: Denies blurriness of vision, double vision or watery eyes Ears, nose, mouth, throat, and face: Denies mucositis or sore throat Respiratory: Denies cough, dyspnea or wheezes  Cardiovascular: Denies palpitation, chest discomfort or lower extremity swelling Gastrointestinal:  Denies nausea, heartburn or change in bowel habits Skin: Denies abnormal skin rashes Lymphatics: Denies new lymphadenopathy or easy bruising (+) right arm swelling Neurological:Denies numbness, tingling or new weaknesses (+) hands numb and aching  Behavioral/Psych: Mood is stable, no new changes  All other systems were reviewed with the patient and are negative.  PHYSICAL EXAMINATION: ECOG PERFORMANCE STATUS: 1   Vitals:   05/31/16 0956  BP: 125/78  Pulse: 84  Resp: 18  Temp: 98.1 F (36.7 C)   Filed Weights   05/31/16 0956  Weight: 157 lb 1.6 oz (71.3 kg)   GENERAL:alert, no distress and comfortable SKIN: skin color, texture, turgor are normal, Several scattered acne-like rash in her upper front chest.  EYES: normal, conjunctiva are pink and non-injected, sclera clear OROPHARYNX:no exudate, no erythema and lips, buccal mucosa, and tongue normal  NECK: supple, thyroid normal size, non-tender, without nodularity LYMPH:  no palpable lymphadenopathy in the cervical, axillary or inguinal LUNGS: clear to auscultation and percussion with normal breathing effort HEART: regular rate & rhythm and no murmurs and no lower extremity edema ABDOMEN:abdomen soft, non-tender and normal bowel sounds Musculoskeletal:no cyanosis of digits and no clubbing  PSYCH: alert & oriented x 3 with fluent  speech NEURO: no focal motor/sensory deficits Breasts: Breast inspection showed them to be symmetrical with no skin change or nipple discharge.  Status post right mastectomy and tissue expander placement, surgical scar has healed well. (+) Diffuse skin pigmentation of the right breast secondary to radiation, no skin ulcers or discharge. Palpation of the left breast and bilateral axilla showed no palpable mass.  LABORATORY DATA:  I have reviewed the data as listed CBC Latest Ref Rng & Units 05/31/2016 05/14/2016 04/17/2016  WBC 3.9 - 10.3 10e3/uL 6.0 8.4 5.9  Hemoglobin 11.6 - 15.9 g/dL 10.6(L) 9.6(L) 8.7(L)  Hematocrit 34.8 - 46.6 % 31.7(L) 27.6(L) 25.8(L)  Platelets 145 - 400 10e3/uL 184 211 174    CMP Latest Ref Rng & Units 05/31/2016 05/14/2016 04/17/2016  Glucose 70 - 140 mg/dl 110 103 104  BUN 7.0 - 26.0 mg/dL 14.7 13.4 16.5  Creatinine 0.6 - 1.1 mg/dL 0.9 1.0 0.9  Sodium 136 - 145 mEq/L 140 140 140  Potassium 3.5 - 5.1 mEq/L 3.6 3.5 3.6  Chloride 101 - 111 mmol/L - - -  CO2 22 - 29 mEq/L '25 28 25  '$ Calcium 8.4 - 10.4 mg/dL 10.1 10.0 9.8  Total Protein 6.4 - 8.3 g/dL 7.7 7.8 7.7  Total Bilirubin 0.20 - 1.20 mg/dL 0.30 0.28 0.38  Alkaline Phos 40 - 150 U/L 62 70 57  AST 5 - 34 U/L '24 24 24  '$ ALT 0 - 55 U/L 15 19 17  PATHOLOGY: Diagnosis 09/29/2015 1. Lymph node, sentinel, biopsy, Right axillary #1 - ONE BENIGN LYMPH NODE WITH NO TUMOR SEEN (0/1). 2. Lymph node, sentinel, biopsy, Right axillary #2 - ONE LYMPH NODE POSITIVE FOR METASTATIC MAMMARY CARCINOMA (1/1). 3. Lymph node, sentinel, biopsy, Right axillary #4 - ONE BENIGN LYMPH NODE WITH NO TUMOR SEEN (0/1). 4. 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 5. 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 - LARGEST FOCUS OF INVASIVE MAMMARY CARCINOMA IS GRADE 2, SHOWS  ASSOCIATED LOBULAR CARCINOMA IN SITU AND MEASURES 13 CM IN GREATEST DIMENSION. - SECOND LARGEST FOCUS SHOWS INVASIVE GRADE 3 MAMMARY CARCINOMA AND SPANS 2.1 CM IN GREATEST DIMENSION. - THIRD LARGEST FOCUS SHOWS INVASIVE GRADE 2 MAMMARY CARCINOMA WITH LOBULAR CARCINOMA IN SITU AND MEASURES 1.9 CM IN GREATEST DIMENSION. - SMALLEST FOCUS SHOWS INVASIVE GRADE 3 MAMMARY CARCINOMA AND MEASURES 0.5 CM IN GREATEST DIMENSION. - ANTERIOR INFERIOR SOFT TISSUE MARGIN DEMONSTRATES BROAD POSITIVITY FOR INVASIVE MAMMARY CARCINOMA. - OTHER MARGINS ARE NEGATIVE. - SEE ONCOLOGY TEMPLATE. 6. Lymph node, sentinel, biopsy, Right axillary #3 - ONE BENIGN LYMPH NODE WITH NO TUMOR SEEN (0/1). 7. Lymph node, sentinel, biopsy, Right axillary #6 - ONE LYMPH NODE WITH ISOLATED TUMOR CELLS. 8. Lymph nodes, regional resection, Right axillary contents - ELEVEN BENIGN LYMPH NODES WITH NO TUMOR SEEN (0/11). Specimen, including laterality and lymph node sampling (sentinel, non-sentinel): Right breast with right sentinel and non-sentinel lymph nodes. Procedure: Right modified mastectomy with sentinel lymph node biopsy and axillary dissection. Histologic type: Multifocal mammary carcinoma with ductal and lobular features. Largest focus (13 cm): Grade: 2. Tubule formation: 3. Nuclear pleomorphism: 2. Mitotic: 1. Second largest focus (2.1 cm): Grade: 3. Tubule formation: 3. Nuclear pleomorphism: 2. Mitotic: 3. Third largest focus (1.9 cm): Grade: 2. Tubule formation: 3. Nuclear pleomorphism: 2. Mitotic: 1. Smallest focus (0.5 cm): Grade: 3. Tubule formation: 3. Nuclear pleomorphism: 2. Mitotic: 3. Tumor size (gross measurements): 13 cm, 2.1 cm, 1.9 cm and 0.5 cm. Margins: Invasive, distance to closest margin: Invasive mammary carcinoma broadly involves the anterior inferior soft tissue margin. In-situ, distance to closest margin: Margin uninvolved by in situ carcinoma. If margin positive, focally or  broadly: Broadly positive. Lymphovascular invasion: Yes, identified. Ductal carcinoma in situ: Not identified. Grade: Not applicable. Extensive intraductal component: Not applicable. Lobular neoplasia: Yes, lobular carcinoma in situ is present. Tumor focality: Multifocal. Treatment effect: Not applicable. If present, treatment effect in breast tissue, lymph nodes or both: Not applicable. Extent of tumor: Skin: Not involved. Nipple: Not involved. Skeletal muscle: Not received. Lymph nodes: Examined: 6 Sentinel. 11 Non-sentinel. 17 Total. Lymph nodes with metastasis: 2 with metastasis and 1 with isolated tumor cells. Isolated tumor cells (< 0.2 mm): 1. Micrometastasis: (> 0.2 mm and < 2.0 mm): 0. Macrometastasis: (> 2.0 mm): 2. Extracapsular extension: Not identified.  Breast prognostic profile: Performed on previous case, [id number]. Right 8 o'clock biopsy 12 cm from the nipple: Estrogen receptor: 95%, positive. Progesterone receptor: 95%, positive. Her-2 neu: 2.04 ratio, positive. Ki-67: 30%. Right breast needle core biopsy 6:30 o'clock position 1 cm from the nipple: Estrogen receptor: 90%, positive. Progesterone receptor: 95%, positive. Her-2 neu: 1.39 ratio, negative. Ki-67: 10%. Non-neoplastic breast: No significant non-neoplastic breast findings.  TNM: pT3(m), pN1a. Comments: A cytokeratin AE1/AE3 immunohistochemical stain is performed on eight blocks containing lymph node tissue (eight stains total). The morphology coupled with the staining pattern is consistent  with the above findings. An E-cadherin immunohistochemical stain is also performed on four blocks from the right simple mastectomy specimen. The staining pattern coupled with the morphology is consistent with the above findings. As there was Her-2 neu positivity in one of the biopsies and both biopsies were positive for estrogen receptor, a repeat breast prognostic profile will not be performed unless  otherwise requested. Dr. Lyndon Code has seen selected slides 5E, 24F, 5G, 5J, 5N, 5O, and 5P with agreement that the tumor is a mulitfocal invasive mammary carcinoma with a mixed ductal and lobular phenotype. Dr. Tresa Moore has also seen a selected slide (5P) with agreement of the positive anterior inferior margin. (RH:ecj 10/03/2015)   RADIOGRAPHIC STUDIES: I have personally reviewed the radiological images as listed and agreed with the findings in the report. No new scans  CT chest, abdomen and pelvis  10/27/2015 IMPRESSION: Status post right mastectomy and right axillary lymph node dissection.  No findings specific for metastatic disease.  Scattered hepatic cysts measuring up to 12 mm. A single 8 mm hypoenhancing lesion is technically indeterminate, but also likely benign, although motion degraded. Consider attention on follow-up.  Bone scan 10/28/2015 IMPRESSION: Small focus of increased radiotracer uptake within the right lateral skull at the region of the right temporal bone. CT of the head may be considered for further evaluation.  Otherwise no evidence of osseous metastatic disease in the axial skeleton.  CT head with and without contrast 11/07/2015 IMPRESSION: 1. Indeterminate 10-11 mm area of heterogeneous bone mineralization along the right sphenoid wing which may correspond to the recent bone scan finding. This might be physiologic. A Repeat CT head (e.g. in 8-12 weeks, Noncontrast should suffice but with and without contrast could be done if repeat brain staging is desired) may be most valuable to evaluate stability. 2. No CT evidence of metastatic disease to the brain. Mild nonspecific white matter changes.  Echo 04/10/2016 Impressions:  - Normal LV size with EF 65-70%. Strain as above. Normal RV size   and systolic function. No significant valvular abnormalities.   ASSESSMENT & PLAN:  41 y.o. African-American female, premenopausal, presented with a palpable right  breast mass.  1. 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 Her initial biopsy showed 1 triple positive disease, the other was ER/PR positive and HER2 Negative disease. -Her staging scans were negative for distant metastasis. -We previously discussed that HER-2 positive breast cancer are more aggressive, with a high risk for recurrence after surgery, especially in the setting of locally advanced stage. I recommend her to have adjuvant chemotherapy TCHP (docetaxel, carboplatin, Herceptin, pejeta) every 3 weeks for total of 6 cycles, followed by Herceptin maintenance therapy to complete 1 year treatment. -Pejeta has been recently approved for maintenance adjuvant therapy for high-risk HER-2 positive disease. I have added it to Herceptin as maintenance therapy. She tolerated pejeta well in the adjuvant chemotherapy setting. -Her baseline echo was normal, which was done in April 2017, she will follow-up with cardiologist to monitor her heart during her herceptin therapy. Last repeated echo on 04/10/2016 was normal. -She completed adjuvant radiation therapy by Dr. Sondra Come on 04/27/2016. Tolerated well. -She has started adjuvant and S2 therapy with tamoxifen, tolerating well overall, with moderate hot flash. She  could not tolerate Effexor. -I previously discussed alternative adjuvant endocrine therapy with aromatase inhibitor and ovarian suppression with her also, given her young age and . Due to her locally advanced disease, I strongly encouraged her to consider this. Potential side effects of overexpression and aromatase inhibitor were discussed with her in details. She is interested. We will  probably start over suppression with Zoladex in a few months when she is stable on tamoxifen. --She is unsure if she is going to undergo reconstruction surgery. She would like to continue tamoxifen for a couple of months before she decides.   2. Iron deficient anemia, and chemo induced anemia  -Her lab tests showed a moderate anemia. -Her anemia has resolved after IV Feraheme, slightly worse after she started chemotherapy. -I encouraged her to continue oral iron pill -Her repeated iron level and a ferritin have normalized.  -She has required blood transfusion in the past -Hemoglobin 10.6 today, improved, we'll continue monitoring  3. Genetic -Given her young age and positive family history, we strongly recommend her to [facility] genetic counseling for genetic testing to ruled out inheritable breast cancer syndrome. She agrees. -Her genetic testing was normal  4.  HTN -She was noticed to have significant elevated blood pressure lately  -She has seen her primary care physician, and her blood pressure medication has been adjusted, including HCTZ  5. Right arm lymphedema  -Secondary to breast surgery and lymph node biopsy -improved   6. Hot flashes and mood swings. -The Effexor made her feel loopy and she has stopped taking it. -Start her on Neurontin for her nocturnal hot flash and peripheral neuropathy   Plan -continue maintenance Herceptin and Perjeta today and every 3 weeks -Continue Tamoxifen  -she will try neurontin, starting '100mg'$  HS, and will titrate dose  -Echocardiogram on 07/09/16. -I'll see her back in 3 weeks.   All questions were answered. The patient and her family members agree with the above plan. The patient knows to call the clinic with any problems, questions or concerns.  I spent 20 minutes counseling the patient face to face. The total time spent in the appointment was 25 minutes and more than 50% was on counseling.  This document serves as a record of services  personally performed by Truitt Merle, MD. It was created on her behalf by Martinique Casey, a trained medical scribe. The creation of this record is based on the scribe's personal observations and the provider's statements to them. This document has been checked and approved by the attending provider.  I have reviewed the above documentation for accuracy and completeness and I agree with the above.    Truitt Merle, MD 05/31/2016

## 2016-05-31 ENCOUNTER — Encounter: Payer: Self-pay | Admitting: Hematology

## 2016-05-31 ENCOUNTER — Ambulatory Visit (HOSPITAL_BASED_OUTPATIENT_CLINIC_OR_DEPARTMENT_OTHER): Payer: BLUE CROSS/BLUE SHIELD | Admitting: Hematology

## 2016-05-31 ENCOUNTER — Other Ambulatory Visit: Payer: BLUE CROSS/BLUE SHIELD

## 2016-05-31 ENCOUNTER — Ambulatory Visit: Payer: BLUE CROSS/BLUE SHIELD

## 2016-05-31 VITALS — BP 125/78 | HR 84 | Temp 98.1°F | Resp 18 | Ht 63.0 in | Wt 157.1 lb

## 2016-05-31 DIAGNOSIS — C50511 Malignant neoplasm of lower-outer quadrant of right female breast: Secondary | ICD-10-CM

## 2016-05-31 DIAGNOSIS — C773 Secondary and unspecified malignant neoplasm of axilla and upper limb lymph nodes: Secondary | ICD-10-CM

## 2016-05-31 DIAGNOSIS — D500 Iron deficiency anemia secondary to blood loss (chronic): Secondary | ICD-10-CM

## 2016-05-31 DIAGNOSIS — Z170 Estrogen receptor positive status [ER+]: Secondary | ICD-10-CM | POA: Diagnosis not present

## 2016-05-31 DIAGNOSIS — D6481 Anemia due to antineoplastic chemotherapy: Secondary | ICD-10-CM

## 2016-05-31 DIAGNOSIS — I10 Essential (primary) hypertension: Secondary | ICD-10-CM

## 2016-05-31 DIAGNOSIS — I890 Lymphedema, not elsewhere classified: Secondary | ICD-10-CM

## 2016-05-31 LAB — CBC WITH DIFFERENTIAL/PLATELET
BASO%: 0.3 % (ref 0.0–2.0)
Basophils Absolute: 0 10*3/uL (ref 0.0–0.1)
EOS%: 3.7 % (ref 0.0–7.0)
Eosinophils Absolute: 0.2 10*3/uL (ref 0.0–0.5)
HCT: 31.7 % — ABNORMAL LOW (ref 34.8–46.6)
HGB: 10.6 g/dL — ABNORMAL LOW (ref 11.6–15.9)
LYMPH%: 42.3 % (ref 14.0–49.7)
MCH: 32.2 pg (ref 25.1–34.0)
MCHC: 33.4 g/dL (ref 31.5–36.0)
MCV: 96.4 fL (ref 79.5–101.0)
MONO#: 0.4 10*3/uL (ref 0.1–0.9)
MONO%: 7.2 % (ref 0.0–14.0)
NEUT#: 2.8 10*3/uL (ref 1.5–6.5)
NEUT%: 46.5 % (ref 38.4–76.8)
Platelets: 184 10*3/uL (ref 145–400)
RBC: 3.29 10*6/uL — ABNORMAL LOW (ref 3.70–5.45)
RDW: 12.5 % (ref 11.2–14.5)
WBC: 6 10*3/uL (ref 3.9–10.3)
lymph#: 2.5 10*3/uL (ref 0.9–3.3)

## 2016-05-31 LAB — COMPREHENSIVE METABOLIC PANEL
ALT: 15 U/L (ref 0–55)
AST: 24 U/L (ref 5–34)
Albumin: 4.1 g/dL (ref 3.5–5.0)
Alkaline Phosphatase: 62 U/L (ref 40–150)
Anion Gap: 9 mEq/L (ref 3–11)
BUN: 14.7 mg/dL (ref 7.0–26.0)
CO2: 25 mEq/L (ref 22–29)
Calcium: 10.1 mg/dL (ref 8.4–10.4)
Chloride: 106 mEq/L (ref 98–109)
Creatinine: 0.9 mg/dL (ref 0.6–1.1)
EGFR: >90 mL/min/{1.73_m2} (ref >90)
Glucose: 110 mg/dl (ref 70–140)
Potassium: 3.6 mEq/L (ref 3.5–5.1)
Sodium: 140 mEq/L (ref 136–145)
Total Bilirubin: 0.3 mg/dL (ref 0.20–1.20)
Total Protein: 7.7 g/dL (ref 6.4–8.3)

## 2016-05-31 LAB — IRON AND TIBC
%SAT: 25 % (ref 21–57)
Iron: 60 ug/dL (ref 41–142)
TIBC: 241 ug/dL (ref 236–444)
UIBC: 181 ug/dL (ref 120–384)

## 2016-05-31 LAB — FERRITIN: Ferritin: 298 ng/ml — ABNORMAL HIGH (ref 9–269)

## 2016-05-31 MED ORDER — GABAPENTIN 100 MG PO CAPS: 100.0000 mg | ORAL_CAPSULE | Freq: Every day | ORAL | 1 refills | Status: DC

## 2016-05-31 MED FILL — GABAPENTIN 100 MG CAPSULE: 100 | 30 days supply | Qty: 60 | Fill #0

## 2016-06-01 ENCOUNTER — Other Ambulatory Visit: Payer: Self-pay | Admitting: Hematology

## 2016-06-04 ENCOUNTER — Ambulatory Visit (HOSPITAL_BASED_OUTPATIENT_CLINIC_OR_DEPARTMENT_OTHER): Payer: BLUE CROSS/BLUE SHIELD

## 2016-06-04 VITALS — BP 121/84 | HR 87 | Temp 99.1°F | Resp 16

## 2016-06-04 DIAGNOSIS — Z5112 Encounter for antineoplastic immunotherapy: Secondary | ICD-10-CM

## 2016-06-04 DIAGNOSIS — C50511 Malignant neoplasm of lower-outer quadrant of right female breast: Secondary | ICD-10-CM | POA: Diagnosis not present

## 2016-06-04 DIAGNOSIS — Z170 Estrogen receptor positive status [ER+]: Principal | ICD-10-CM

## 2016-06-04 MED ORDER — ACETAMINOPHEN 325 MG PO TABS
650.0000 mg | ORAL_TABLET | Freq: Once | ORAL | Status: AC
  Administered 2016-06-04: 650 mg via ORAL

## 2016-06-04 MED ORDER — TRASTUZUMAB CHEMO 150 MG IV SOLR
6.0000 mg/kg | Freq: Once | INTRAVENOUS | Status: AC
  Administered 2016-06-04: 420 mg via INTRAVENOUS
  Filled 2016-06-04: qty 20

## 2016-06-04 MED ORDER — DIPHENHYDRAMINE HCL 25 MG PO CAPS
ORAL_CAPSULE | ORAL | Status: AC
  Filled 2016-06-04: qty 2

## 2016-06-04 MED ORDER — DIPHENHYDRAMINE HCL 25 MG PO CAPS
50.0000 mg | ORAL_CAPSULE | Freq: Once | ORAL | Status: AC
  Administered 2016-06-04: 25 mg via ORAL

## 2016-06-04 MED ORDER — HEPARIN SOD (PORK) LOCK FLUSH 100 UNIT/ML IV SOLN
500.0000 [IU] | Freq: Once | INTRAVENOUS | Status: AC | PRN
  Administered 2016-06-04: 500 [IU]
  Filled 2016-06-04: qty 5

## 2016-06-04 MED ORDER — SODIUM CHLORIDE 0.9 % IV SOLN
Freq: Once | INTRAVENOUS | Status: AC
Start: 2016-06-04 — End: 2016-06-04
  Administered 2016-06-04: 09:00:00 via INTRAVENOUS

## 2016-06-04 MED ORDER — ACETAMINOPHEN 325 MG PO TABS
ORAL_TABLET | ORAL | Status: AC
  Filled 2016-06-04: qty 2

## 2016-06-04 MED ORDER — PERTUZUMAB CHEMO INJECTION 420 MG/14ML
840.0000 mg | Freq: Once | INTRAVENOUS | Status: AC
  Administered 2016-06-04: 840 mg via INTRAVENOUS
  Filled 2016-06-04: qty 28

## 2016-06-04 MED ORDER — SODIUM CHLORIDE 0.9% FLUSH
10.0000 mL | INTRAVENOUS | Status: DC | PRN
  Administered 2016-06-04: 10 mL
  Filled 2016-06-04: qty 10

## 2016-06-04 NOTE — Patient Instructions (Signed)
[facility] Discharge Instructions for Patients Receiving Chemotherapy  Today you received the following chemotherapy agents Herceptin and Perjeta   To help prevent nausea and vomiting after your treatment, we encourage you to take your nausea medication as directed.    If you develop nausea and vomiting that is not controlled by your nausea medication, call the clinic.   BELOW ARE SYMPTOMS THAT SHOULD BE REPORTED IMMEDIATELY:  *FEVER GREATER THAN 100.5 F  *CHILLS WITH OR WITHOUT FEVER  NAUSEA AND VOMITING THAT IS NOT CONTROLLED WITH YOUR NAUSEA MEDICATION  *UNUSUAL SHORTNESS OF BREATH  *UNUSUAL BRUISING OR BLEEDING  TENDERNESS IN MOUTH AND THROAT WITH OR WITHOUT PRESENCE OF ULCERS  *URINARY PROBLEMS  *BOWEL PROBLEMS  UNUSUAL RASH Items with * indicate a potential emergency and should be followed up as soon as possible.  Feel free to call the clinic you have any questions or concerns. The clinic phone number is [phone].  Please show the CHEMO ALERT CARD at check-in to the Emergency Department and triage nurse.   

## 2016-06-04 NOTE — Progress Notes (Signed)
Pt asymptomatic upon chemotherapy d/c. Pt refused 37min post infusion observation.

## 2016-06-07 ENCOUNTER — Encounter (HOSPITAL_COMMUNITY): Payer: Self-pay

## 2016-06-11 MED FILL — TAMOXIFEN 20 MG TABLET: 20 | 30 days supply | Qty: 30 | Fill #1

## 2016-06-14 ENCOUNTER — Ambulatory Visit: Payer: BLUE CROSS/BLUE SHIELD | Admitting: Radiation Oncology

## 2016-06-19 ENCOUNTER — Encounter: Payer: Self-pay | Admitting: Radiation Oncology

## 2016-06-19 NOTE — Progress Notes (Signed)
Paperwork ([email or web address]) received 06/19/16, given to nurse 06/20/16

## 2016-06-21 MED FILL — AMLODIPINE BESYLATE 10 MG T: 10 | 30 days supply | Qty: 30 | Fill #2

## 2016-06-21 NOTE — Progress Notes (Signed)
[facility]  [phone] [phone]  Clinic Follow Up Note   Patient Care Team: Renaye Rakers, MD as PCP - General (Family Medicine) Glenna Fellows, MD as Consulting Physician (General Surgery) Lurline Hare, MD (Inactive) as Consulting Physician (Radiation Oncology) 06/25/2016  CHIEF COMPLAINTS:  Follow up right breast cancer  Oncology History   Breast cancer of lower-outer quadrant of right female breast [facility])   Staging form: Breast, AJCC 7th Edition     Clinical stage from 08/03/2015: Stage IA (T1c, N0, M0) - Unsigned     Pathologic stage from 09/29/2015: Stage IIIA (T3(m), N1a, cM0) - Signed by Malachy Mood, MD on 10/17/2015         Breast cancer of lower-outer quadrant of right female breast (HCC)   07/25/2015 Mammogram    diagnostic mammogram and ultrasound showed a 11 mm mass in the 8:00 position of the right breast, small irregular lesion in the retroareolar regio of the right breast, contiguous with the dermis      07/26/2015 Initial Diagnosis    Breast cancer of lower-outer quadrant of right female breast (HCC)      07/26/2015 Initial Biopsy    right breast needle core biopsy at 8:00 position (1), invasive ductal carcinoma, grade 3, 6:30 o'clock position invasive (2) and in situ ductal carcinoma, grade 1-2, lymphovascular invasion is identified      07/26/2015 Receptors her2    (1) ER 95% positive, PR 95% positive, Ki-67 30%, HER-2 positive with ratio 2.04, copy # 5.20, (2) ER 90% positive, PR 95% positive, Ki-67 10%, HER-2 negative.      08/02/2015 Imaging    breast MRI showed extensive mass  and non-mass enhancement in the lower outer quadrant of the right breast  spanning 13 cm, focal enhancement within the right nipple, at least 3-4 abnormal appearing right axillary lymph node.      09/29/2015 Surgery    right mastectomy and axillar node dissection       09/29/2015 Pathology Results    Right mastectomy and axillary node dissection  showed multifocal mammary carcinoma with ductal and lobular features, tumor size 13 cm, tubal 1 cm, 1.9 cm, 2.5 cm, grade 2-3, anterior margins were positive, 2 nodes with metastasis and one with isolated cells      11/01/2015 - 02/14/2016 Chemotherapy    TCHP every 3 weeks, for 6 cycles, followed by herceptin maintenance therapy for a total of one year       03/07/2016 - 04/27/2016 Radiation Therapy    03/07/16-04/27/16 Site/dose: 1) 75fld right chest wall/ 45 Gy in 25 fractions 2) Right mastectomy scar / 16 Gy in 8 fractions       HISTORY OF PRESENTING ILLNESS (08/03/2015):  Melanie Moss 41 y.o. female is here because of Her newly diagnosed right breast cancer. She is accompanied by her fianc, sister and mother to our multidisciplinary breast clinic today.  She felt a breast lump and noticed mild intermittent breast pain 2 weeks ago, no nipple discharge or skin change. She also has moderate fatigue and dyspnea on moderate to heavy exertion for a few months, no weight loss, no other pain or other symptoms.  She had normal mammgram 6 years ago, for a small lump which felt to be benign and no biopsy was done.    She has iregular period, twice a month, it lasts about 5-7 days, heavy for 3 days, she chanes 6-7 times a day. She was found to be anemic recently, and has  started taking oral iron pill a few weeks ago, tolerates well.  GYN HISTORY  Menarchal: 10  LMP: 07/29/2015 Contraceptive: no  HRT: n/a  G4P1: 3 miscarrage, one 32 yo son, no plan to have more children   CURRENT THERAPY: Maintenance Herceptin and perjeta, every 3 weeks, started on 03/06/2016. Tamoxifen 20 mg starting 05/21/16.  INTERIM HISTORY: Melanie Moss returns for follow-up and maintenance Herceptin treatment. She is still taking tamoxifen, but she does not like the Effexor. She says it makes her feel loopy. Tried Neurontin once, but stopped. The hot flashes and night sweats are still present. She used Neurontin once for hot  flashes, but felt loopy and stopped. She reports not sleeping well due to the hot flashes. She drinks cold water for the hot flashes. Reports stiff hands and fingers in the mornings. Does hand exercises. Difficulty grasping objects. She reports having diarrhea for two days. She took imodium that helps. Reports her toenails turned black, but denies pain or erythema. She wants to return to work in April. Reports occasional right breast pain when she lays on her right side.   MEDICAL HISTORY:  Past Medical History:  Diagnosis Date  . Anemia   . Anxiety   . Breast cancer of lower-outer quadrant of right female breast (HCC) 07/29/2015  . Family history of breast cancer   . Family history of colon cancer   . Family history of prostate cancer   . GERD (gastroesophageal reflux disease)    tums   . Gestational diabetes   . Hyperlipidemia   . Hypertension    not currently on medication  . Preterm labor     SURGICAL HISTORY: Past Surgical History:  Procedure Laterality Date  . AXILLARY LYMPH NODE DISSECTION Right 09/29/2015   Procedure: AXILLARY LYMPH NODE DISSECTION;  Surgeon: Glenna Fellows, MD;  Location: [facility] OR;  Service: General;  Laterality: Right;  . BREAST RECONSTRUCTION WITH PLACEMENT OF TISSUE EXPANDER AND FLEX HD (ACELLULAR HYDRATED DERMIS) Right 09/29/2015   Procedure: BREAST RECONSTRUCTION WITH PLACEMENT OF TISSUE EXPANDER AND FLEX HD (ACELLULAR HYDRATED DERMIS);  Surgeon: Alena Bills Dillingham, DO;  Location: [facility] OR;  Service: Plastics;  Laterality: Right;  . CERVICAL CERCLAGE  02/19/2011   Procedure: CERCLAGE CERVICAL;  Surgeon: Roseanna Rainbow, MD;  Location: [facility] ORS;  Service: Gynecology;  Laterality: N/A;  . DILATION AND CURETTAGE OF UTERUS    . MASTECTOMY WITH AXILLARY LYMPH NODE DISSECTION Right 09/29/2015  . PORTACATH PLACEMENT N/A 09/29/2015   Procedure: INSERTION PORT-A-CATH;  Surgeon: Glenna Fellows, MD;  Location: [facility] OR;  Service: General;  Laterality: N/A;  . SIMPLE  MASTECTOMY WITH AXILLARY SENTINEL NODE BIOPSY Right 09/29/2015   Procedure: RIGHT TOTAL  MASTECTOMY WITH AXILLARY SENTINEL NODE BIOPSY;  Surgeon: Glenna Fellows, MD;  Location: [facility] OR;  Service: General;  Laterality: Right;    SOCIAL HISTORY: Social History   Social History  . Marital status: Single    Spouse name: N/A  . Number of children: 1  . Years of education: N/A   Occupational History  . Not on file.   Social History Main Topics  . Smoking status: Never Smoker  . Smokeless tobacco: Never Used  . Alcohol use No  . Drug use: No  . Sexual activity: Yes   Other Topics Concern  . Not on file   Social History Narrative  . No narrative on file    FAMILY HISTORY: Family History  Problem Relation Age of Onset  . Hypertension Mother   . Hypertension  Father   . Prostate cancer Father 50  . Breast cancer Maternal Aunt 32  . Breast cancer Paternal Aunt 76  . Cirrhosis Maternal Uncle   . Prostate cancer Paternal Uncle   . Throat cancer Maternal Aunt     non smoker  . Prostate cancer Paternal Uncle   . Colon cancer Cousin     maternal first cousin dx in his 76s  . Lung cancer Cousin     smoker    ALLERGIES:  has No Known Allergies.  MEDICATIONS:  Current Outpatient Prescriptions  Medication Sig Dispense Refill  . amLODipine (NORVASC) 10 MG tablet Take 1 tablet (10 mg total) by mouth at bedtime. 30 tablet 3  . B Complex-C (SUPER B COMPLEX PO) Take 1 tablet by mouth daily.    . diphenhydrAMINE (BENADRYL) 12.5 MG/5ML elixir Take 5 mLs (12.5 mg total) by mouth every 6 (six) hours as needed for itching. 120 mL 0  . Elastic Bandages & Supports (KNEE COMPRESSION SLEEVE/L/XL) MISC   0  . emollient (BIAFINE) cream Apply topically as needed.    . ferrous sulfate 325 (65 FE) MG tablet Take 325 mg by mouth 2 (two) times daily with a meal.    . hyaluronate sodium (RADIAPLEXRX) GEL Apply 1 application topically 2 (two) times daily.    Marland Kitchen HYDROcodone-acetaminophen  (NORCO/VICODIN) 5-325 MG tablet Take 1 tablet by mouth every 8 (eight) hours as needed for moderate pain. 20 tablet 0  . hydrocortisone 2.5 % cream Apply topically 2 (two) times daily. 30 g 1  . ibuprofen (ADVIL,MOTRIN) 200 MG tablet Take 200 mg by mouth every 6 (six) hours as needed.    Marland Kitchen LORazepam (ATIVAN) 0.5 MG tablet Take 1 tablet (0.5 mg total) by mouth at bedtime as needed for anxiety (nausea). 30 tablet 0  . Multiple Vitamin (MULTIVITAMIN) tablet Take 1 tablet by mouth daily.    . non-metallic deodorant Jethro Poling) MISC Apply 1 application topically daily as needed.    . ondansetron (ZOFRAN-ODT) 4 MG disintegrating tablet Take 4 mg by mouth every 8 (eight) hours as needed for nausea or vomiting. Reported on 11/01/2015  0  . polyethylene glycol (MIRALAX / GLYCOLAX) packet Take 17 g by mouth daily as needed for mild constipation. 14 each 0  . Potassium Chloride ER 20 MEQ TBCR Take 20 mEq by mouth daily. 30 tablet 1  . senna (SENOKOT) 8.6 MG TABS tablet Take 1 tablet (8.6 mg total) by mouth 2 (two) times daily. (Patient taking differently: Take 1 tablet by mouth daily as needed. ) 120 each 0  . tamoxifen (NOLVADEX) 20 MG tablet Take 1 tablet (20 mg total) by mouth daily. 30 tablet 2  . valsartan-hydrochlorothiazide (DIOVAN-HCT) 160-12.5 MG tablet Take 1 tablet by mouth every morning.  0  . venlafaxine XR (EFFEXOR XR) 37.5 MG 24 hr capsule Take 1 capsule (37.5 mg total) by mouth daily with breakfast. 30 capsule 1   No current facility-administered medications for this visit.    Facility-Administered Medications Ordered in Other Visits  Medication Dose Route Frequency Provider Last Rate Last Dose  . 0.9 %  sodium chloride infusion   Intravenous Once Truitt Merle, MD      . alteplase (CATHFLO ACTIVASE) injection 2 mg  2 mg Intracatheter Once PRN Truitt Merle, MD      . heparin lock flush 100 unit/mL  250 Units Intracatheter Once PRN Truitt Merle, MD      . heparin lock flush 100 unit/mL  500 Units Intravenous  Once PRN Truitt Merle, MD      . heparin lock flush 100 unit/mL  500 Units Intracatheter Once PRN Truitt Merle, MD      . pertuzumab (PERJETA) 420 mg in sodium chloride 0.9 % 250 mL chemo infusion  420 mg Intravenous Once Truitt Merle, MD      . sodium chloride 0.9 % injection 10 mL  10 mL Intracatheter PRN Truitt Merle, MD      . sodium chloride 0.9 % injection 10 mL  10 mL Intravenous PRN Truitt Merle, MD      . sodium chloride 0.9 % injection 3 mL  3 mL Intravenous Once PRN Truitt Merle, MD      . sodium chloride flush (NS) 0.9 % injection 10 mL  10 mL Intravenous PRN Truitt Merle, MD      . sodium chloride flush (NS) 0.9 % injection 10 mL  10 mL Intracatheter PRN Truitt Merle, MD      . trastuzumab (HERCEPTIN) 420 mg in sodium chloride 0.9 % 250 mL chemo infusion  6 mg/kg (Treatment Plan Recorded) Intravenous Once Truitt Merle, MD        REVIEW OF SYSTEMS:   Constitutional: Denies fevers, chills  (+) Hot flashes, night sweats Eyes: Denies blurriness of vision, double vision or watery eyes Ears, nose, mouth, throat, and face: Denies mucositis or sore throat Respiratory: Denies cough, dyspnea or wheezes  Cardiovascular: Denies palpitation, chest discomfort or lower extremity swelling Gastrointestinal:  Denies nausea, heartburn or change in bowel habits Skin: Denies abnormal skin rashes Lymphatics: Denies new lymphadenopathy or easy bruising (+) right arm swelling Neurological:Denies numbness, tingling or new weaknesses (+) hands numb and aching  Behavioral/Psych: Mood is stable, no new changes  All other systems were reviewed with the patient and are negative.  PHYSICAL EXAMINATION: ECOG PERFORMANCE STATUS: 1   Vitals:   06/25/16 1000  BP: (!) 133/93  Pulse: 90  Resp: 18  Temp: 98.3 F (36.8 C)   Filed Weights   06/25/16 1000  Weight: 157 lb 1.6 oz (71.3 kg)   GENERAL:alert, no distress and comfortable SKIN: skin color, texture, turgor are normal, Several scattered acne-like rash in her upper front chest.    EYES: normal, conjunctiva are pink and non-injected, sclera clear OROPHARYNX:no exudate, no erythema and lips, buccal mucosa, and tongue normal  NECK: supple, thyroid normal size, non-tender, without nodularity LYMPH:  no palpable lymphadenopathy in the cervical, axillary or inguinal LUNGS: clear to auscultation and percussion with normal breathing effort HEART: regular rate & rhythm and no murmurs and no lower extremity edema ABDOMEN:abdomen soft, non-tender and normal bowel sounds Musculoskeletal:no cyanosis of digits and no clubbing  PSYCH: alert & oriented x 3 with fluent speech NEURO: no focal motor/sensory deficits Breasts: Breast inspection showed them to be symmetrical with no skin change or nipple discharge.  Status post right mastectomy and tissue expander placement, surgical scar has healed well. (+) Diffuse skin pigmentation of the right breast secondary to radiation, no skin ulcers or discharge. Palpation of the left breast and bilateral axilla showed no palpable mass.  LABORATORY DATA:  I have reviewed the data as listed CBC Latest Ref Rng & Units 06/25/2016 05/31/2016 05/14/2016  WBC 3.9 - 10.3 10e3/uL 7.1 6.0 8.4  Hemoglobin 11.6 - 15.9 g/dL 10.5(L) 10.6(L) 9.6(L)  Hematocrit 34.8 - 46.6 % 30.0(L) 31.7(L) 27.6(L)  Platelets 145 - 400 10e3/uL 226 184 211    CMP Latest Ref Rng & Units 06/25/2016 05/31/2016 05/14/2016  Glucose 70 -  140 mg/dl 105 110 103  BUN 7.0 - 26.0 mg/dL 12.6 14.7 13.4  Creatinine 0.6 - 1.1 mg/dL 0.9 0.9 1.0  Sodium 136 - 145 mEq/L 142 140 140  Potassium 3.5 - 5.1 mEq/L 3.5 3.6 3.5  Chloride 101 - 111 mmol/L - - -  CO2 22 - 29 mEq/L '26 25 28  '$ Calcium 8.4 - 10.4 mg/dL 9.8 10.1 10.0  Total Protein 6.4 - 8.3 g/dL 7.5 7.7 7.8  Total Bilirubin 0.20 - 1.20 mg/dL 0.30 0.30 0.28  Alkaline Phos 40 - 150 U/L 54 62 70  AST 5 - 34 U/L '22 24 24  '$ ALT 0 - 55 U/L '15 15 19    '$ PATHOLOGY: Diagnosis 09/29/2015 1. Lymph node, sentinel, biopsy, Right axillary #1 - ONE BENIGN  LYMPH NODE WITH NO TUMOR SEEN (0/1). 2. Lymph node, sentinel, biopsy, Right axillary #2 - ONE LYMPH NODE POSITIVE FOR METASTATIC MAMMARY CARCINOMA (1/1). 3. Lymph node, sentinel, biopsy, Right axillary #4 - ONE BENIGN LYMPH NODE WITH NO TUMOR SEEN (0/1). 4. 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 5. 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 - LARGEST FOCUS OF INVASIVE MAMMARY CARCINOMA IS GRADE 2, SHOWS ASSOCIATED LOBULAR CARCINOMA IN SITU AND MEASURES 13 CM IN GREATEST DIMENSION. - SECOND LARGEST FOCUS SHOWS INVASIVE GRADE 3 MAMMARY CARCINOMA AND SPANS 2.1 CM IN GREATEST DIMENSION. - THIRD LARGEST FOCUS SHOWS INVASIVE GRADE 2 MAMMARY CARCINOMA WITH LOBULAR CARCINOMA IN SITU AND MEASURES 1.9 CM IN GREATEST DIMENSION. - SMALLEST FOCUS SHOWS INVASIVE GRADE 3 MAMMARY CARCINOMA AND MEASURES 0.5 CM IN GREATEST DIMENSION. - ANTERIOR INFERIOR SOFT TISSUE MARGIN DEMONSTRATES BROAD POSITIVITY FOR INVASIVE MAMMARY CARCINOMA. - OTHER MARGINS ARE NEGATIVE. - SEE ONCOLOGY TEMPLATE. 6. Lymph node, sentinel, biopsy, Right axillary #3 - ONE BENIGN LYMPH NODE WITH NO TUMOR SEEN (0/1). 7. Lymph node, sentinel, biopsy, Right axillary #6 - ONE LYMPH NODE WITH ISOLATED TUMOR CELLS. 8. Lymph nodes, regional resection, Right axillary contents - ELEVEN BENIGN LYMPH NODES WITH NO TUMOR SEEN (0/11). Specimen, including laterality and lymph node sampling (sentinel, non-sentinel): Right breast with right sentinel and non-sentinel lymph nodes. Procedure: Right modified mastectomy with sentinel lymph node biopsy and axillary dissection. Histologic type: Multifocal mammary carcinoma with ductal and lobular features. Largest focus (13 cm): Grade: 2. Tubule formation: 3. Nuclear pleomorphism: 2. Mitotic: 1. Second largest focus (2.1 cm): Grade:  3. Tubule formation: 3. Nuclear pleomorphism: 2. Mitotic: 3. Third largest focus (1.9 cm): Grade: 2. Tubule formation: 3. Nuclear pleomorphism: 2. Mitotic: 1. Smallest focus (0.5 cm): Grade: 3. Tubule formation: 3. Nuclear pleomorphism: 2. Mitotic: 3. Tumor size (gross measurements): 13 cm, 2.1 cm, 1.9 cm and 0.5 cm. Margins: Invasive, distance to closest margin: Invasive mammary carcinoma broadly involves the anterior inferior soft tissue margin. In-situ, distance to closest margin: Margin uninvolved by in situ carcinoma. If margin positive, focally or broadly: Broadly positive. Lymphovascular invasion: Yes, identified. Ductal carcinoma in situ: Not identified. Grade: Not applicable. Extensive intraductal component: Not applicable. Lobular neoplasia: Yes, lobular carcinoma in situ is present. Tumor focality: Multifocal. Treatment effect: Not applicable. If present, treatment effect in breast tissue, lymph nodes or both: Not applicable. Extent of tumor: Skin: Not involved. Nipple: Not involved. Skeletal muscle: Not received. Lymph nodes: Examined: 6 Sentinel. 11 Non-sentinel. 17 Total. Lymph nodes with metastasis: 2 with metastasis and 1 with isolated tumor cells. Isolated tumor cells (< 0.2  mm): 1. Micrometastasis: (> 0.2 mm and < 2.0 mm): 0. Macrometastasis: (> 2.0 mm): 2. Extracapsular extension: Not identified.  Breast prognostic profile: Performed on previous case, [id number]. Right 8 o'clock biopsy 12 cm from the nipple: Estrogen receptor: 95%, positive. Progesterone receptor: 95%, positive. Her-2 neu: 2.04 ratio, positive. Ki-67: 30%. Right breast needle core biopsy 6:30 o'clock position 1 cm from the nipple: Estrogen receptor: 90%, positive. Progesterone receptor: 95%, positive. Her-2 neu: 1.39 ratio, negative. Ki-67: 10%. Non-neoplastic breast: No significant non-neoplastic breast findings.  TNM: pT3(m), pN1a. Comments: A cytokeratin AE1/AE3  immunohistochemical stain is performed on eight blocks containing lymph node tissue (eight stains total). The morphology coupled with the staining pattern is consistent with the above findings. An E-cadherin immunohistochemical stain is also performed on four blocks from the right simple mastectomy specimen. The staining pattern coupled with the morphology is consistent with the above findings. As there was Her-2 neu positivity in one of the biopsies and both biopsies were positive for estrogen receptor, a repeat breast prognostic profile will not be performed unless otherwise requested. Dr. Lyndon Code has seen selected slides 5E, 72F, 5G, 5J, 5N, 5O, and 5P with agreement that the tumor is a mulitfocal invasive mammary carcinoma with a mixed ductal and lobular phenotype. Dr. Tresa Moore has also seen a selected slide (5P) with agreement of the positive anterior inferior margin. (RH:ecj 10/03/2015)   RADIOGRAPHIC STUDIES: I have personally reviewed the radiological images as listed and agreed with the findings in the report. No new scans  CT chest, abdomen and pelvis  10/27/2015 IMPRESSION: Status post right mastectomy and right axillary lymph node dissection.  No findings specific for metastatic disease.  Scattered hepatic cysts measuring up to 12 mm. A single 8 mm hypoenhancing lesion is technically indeterminate, but also likely benign, although motion degraded. Consider attention on follow-up.  Bone scan 10/28/2015 IMPRESSION: Small focus of increased radiotracer uptake within the right lateral skull at the region of the right temporal bone. CT of the head may be considered for further evaluation.  Otherwise no evidence of osseous metastatic disease in the axial skeleton.  CT head with and without contrast 11/07/2015 IMPRESSION: 1. Indeterminate 10-11 mm area of heterogeneous bone mineralization along the right sphenoid wing which may correspond to the recent bone scan finding. This  might be physiologic. A Repeat CT head (e.g. in 8-12 weeks, Noncontrast should suffice but with and without contrast could be done if repeat brain staging is desired) may be most valuable to evaluate stability. 2. No CT evidence of metastatic disease to the brain. Mild nonspecific white matter changes.  Echo 04/10/2016 Impressions:  - Normal LV size with EF 65-70%. Strain as above. Normal RV size   and systolic function. No significant valvular abnormalities.   ASSESSMENT & PLAN:  41 y.o. African-American female, premenopausal, presented with a palpable right breast mass.  1. 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 Her initial biopsy showed 1 triple positive disease, the other was ER/PR positive and HER2 Negative disease. -Her staging scans were negative for distant metastasis. -We previously discussed that HER-2 positive breast cancer are more aggressive, with a high risk for recurrence after surgery, especially in the setting of locally advanced stage. I recommend her  to have adjuvant chemotherapy TCHP (docetaxel, carboplatin, Herceptin, pejeta) every 3 weeks for total of 6 cycles, followed by Herceptin maintenance therapy to complete 1 year treatment. -Pejeta has been recently approved for maintenance adjuvant therapy for high-risk HER-2 positive disease. I have added it to Herceptin as maintenance therapy. She tolerated pejeta well in the adjuvant chemotherapy setting. -Her baseline echo was normal, she will follow-up with cardiologist to monitor her heart during her herceptin therapy. Last repeated echo on 04/10/2016 was normal. -She completed adjuvant radiation  therapy by Dr. Sondra Come on 04/27/2016. Tolerated well. -She has started adjuvant antiestrogen therapy with tamoxifen, tolerating well overall, with moderate hot flashes. She could not tolerate Effexor. -I previously discussed alternative adjuvant endocrine therapy with aromatase inhibitor and ovarian suppression with her also, given her young age and due to her locally advanced disease, I strongly encouraged her to consider this. Potential side effects of overexpression and aromatase inhibitor were discussed with her in details. She is interested. She would like to discuss with her GYN. We will probably start over suppression with Zoladex in a few months when she is stable on tamoxifen.  2. Iron deficient anemia, and chemo induced anemia  -Her lab tests showed a moderate anemia. -Her anemia has resolved after IV Feraheme, slightly worse after she started chemotherapy. -I encouraged her to continue oral iron pill -Her repeated iron level and a ferritin have normalized.  -She has required blood transfusion in the past -Hemoglobin 10.5 TODAY, improved from December 2017, we'll continue monitoring.  3. Genetic -Given her young age and positive family history, we strongly recommend her to [facility] genetic counseling for genetic testing to ruled out inheritable breast cancer syndrome. She agrees. -Her genetic testing was normal  4.  HTN -She was noticed to have significant elevated blood pressure lately  -She has seen her primary care physician, and her blood pressure medication has been adjusted, including HCTZ  5. Right arm lymphedema  -Secondary to breast surgery and lymph node biopsy -improved   6. Hot flashes and mood swings. -The Effexor made her feel loopy and she has stopped taking it. -I started her on Neurontin for her nocturnal hot flash and peripheral neuropathy, but she only took it once due to feeling loopy.  Plan -I wrote a work note for the patient to return to work in early  April. -continue maintenance Herceptin and Perjeta today and every 3 weeks -Continue Tamoxifen -Echocardiogram on 07/09/16. -Labs, flush, herceptin and perjeta in 3 weeks, 08/09/16 and 08/30/16. -Follow up on 5/10 -She will make an appointment see her gynecologist to discuss Zoladex and BSO  All questions were answered. The patient and her family members agree with the above plan. The patient knows to call the clinic with any problems, questions or concerns.  I spent 20 minutes counseling the patient face to face. The total time spent in the appointment was 25 minutes and more than 50% was on counseling.    Truitt Merle, MD 06/25/2016    This document serves as a record of services personally performed by Truitt Merle, MD. It was created on her behalf by Darcus Austin, a trained medical scribe. The creation of this record is based on the scribe's personal observations and the provider's statements to them. This document has been checked and approved by the attending provider.

## 2016-06-25 ENCOUNTER — Telehealth: Payer: Self-pay | Admitting: Hematology

## 2016-06-25 ENCOUNTER — Encounter: Payer: Self-pay | Admitting: Hematology

## 2016-06-25 ENCOUNTER — Other Ambulatory Visit (HOSPITAL_BASED_OUTPATIENT_CLINIC_OR_DEPARTMENT_OTHER): Payer: BLUE CROSS/BLUE SHIELD

## 2016-06-25 ENCOUNTER — Ambulatory Visit (HOSPITAL_BASED_OUTPATIENT_CLINIC_OR_DEPARTMENT_OTHER): Payer: BLUE CROSS/BLUE SHIELD | Admitting: Hematology

## 2016-06-25 ENCOUNTER — Ambulatory Visit (HOSPITAL_BASED_OUTPATIENT_CLINIC_OR_DEPARTMENT_OTHER): Payer: BLUE CROSS/BLUE SHIELD

## 2016-06-25 ENCOUNTER — Ambulatory Visit: Payer: BLUE CROSS/BLUE SHIELD

## 2016-06-25 VITALS — BP 133/93 | HR 90 | Temp 98.3°F | Resp 18 | Ht 63.0 in | Wt 157.1 lb

## 2016-06-25 DIAGNOSIS — Z170 Estrogen receptor positive status [ER+]: Principal | ICD-10-CM

## 2016-06-25 DIAGNOSIS — D6481 Anemia due to antineoplastic chemotherapy: Secondary | ICD-10-CM

## 2016-06-25 DIAGNOSIS — D509 Iron deficiency anemia, unspecified: Secondary | ICD-10-CM | POA: Diagnosis not present

## 2016-06-25 DIAGNOSIS — D500 Iron deficiency anemia secondary to blood loss (chronic): Secondary | ICD-10-CM

## 2016-06-25 DIAGNOSIS — C50511 Malignant neoplasm of lower-outer quadrant of right female breast: Secondary | ICD-10-CM

## 2016-06-25 DIAGNOSIS — I10 Essential (primary) hypertension: Secondary | ICD-10-CM | POA: Diagnosis not present

## 2016-06-25 DIAGNOSIS — Z5112 Encounter for antineoplastic immunotherapy: Secondary | ICD-10-CM | POA: Diagnosis not present

## 2016-06-25 DIAGNOSIS — C773 Secondary and unspecified malignant neoplasm of axilla and upper limb lymph nodes: Secondary | ICD-10-CM | POA: Diagnosis not present

## 2016-06-25 LAB — COMPREHENSIVE METABOLIC PANEL
ALT: 15 U/L (ref 0–55)
AST: 22 U/L (ref 5–34)
Albumin: 4 g/dL (ref 3.5–5.0)
Alkaline Phosphatase: 54 U/L (ref 40–150)
Anion Gap: 9 mEq/L (ref 3–11)
BUN: 12.6 mg/dL (ref 7.0–26.0)
CO2: 26 mEq/L (ref 22–29)
Calcium: 9.8 mg/dL (ref 8.4–10.4)
Chloride: 107 mEq/L (ref 98–109)
Creatinine: 0.9 mg/dL (ref 0.6–1.1)
EGFR: >90 mL/min/{1.73_m2} (ref >90)
Glucose: 105 mg/dl (ref 70–140)
Potassium: 3.5 mEq/L (ref 3.5–5.1)
Sodium: 142 mEq/L (ref 136–145)
Total Bilirubin: 0.3 mg/dL (ref 0.20–1.20)
Total Protein: 7.5 g/dL (ref 6.4–8.3)

## 2016-06-25 LAB — FERRITIN: Ferritin: 258 ng/ml (ref 9–269)

## 2016-06-25 LAB — CBC WITH DIFFERENTIAL/PLATELET
BASO%: 0.7 % (ref 0.0–2.0)
Basophils Absolute: 0.1 10*3/uL (ref 0.0–0.1)
EOS%: 2.2 % (ref 0.0–7.0)
Eosinophils Absolute: 0.2 10*3/uL (ref 0.0–0.5)
HCT: 30 % — ABNORMAL LOW (ref 34.8–46.6)
HGB: 10.5 g/dL — ABNORMAL LOW (ref 11.6–15.9)
LYMPH%: 40.1 % (ref 14.0–49.7)
MCH: 32.4 pg (ref 25.1–34.0)
MCHC: 34.9 g/dL (ref 31.5–36.0)
MCV: 92.8 fL (ref 79.5–101.0)
MONO#: 0.5 10*3/uL (ref 0.1–0.9)
MONO%: 7 % (ref 0.0–14.0)
NEUT#: 3.5 10*3/uL (ref 1.5–6.5)
NEUT%: 50 % (ref 38.4–76.8)
Platelets: 226 10*3/uL (ref 145–400)
RBC: 3.24 10*6/uL — ABNORMAL LOW (ref 3.70–5.45)
RDW: 12.2 % (ref 11.2–14.5)
WBC: 7.1 10*3/uL (ref 3.9–10.3)
lymph#: 2.8 10*3/uL (ref 0.9–3.3)

## 2016-06-25 LAB — IRON AND TIBC
%SAT: 24 % (ref 21–57)
Iron: 57 ug/dL (ref 41–142)
TIBC: 235 ug/dL — ABNORMAL LOW (ref 236–444)
UIBC: 178 ug/dL (ref 120–384)

## 2016-06-25 MED ORDER — DIPHENHYDRAMINE HCL 25 MG PO CAPS
50.0000 mg | ORAL_CAPSULE | Freq: Once | ORAL | Status: AC
  Administered 2016-06-25: 25 mg via ORAL

## 2016-06-25 MED ORDER — HEPARIN SOD (PORK) LOCK FLUSH 100 UNIT/ML IV SOLN
500.0000 [IU] | Freq: Once | INTRAVENOUS | Status: DC | PRN
  Filled 2016-06-25: qty 5

## 2016-06-25 MED ORDER — SODIUM CHLORIDE 0.9 % IV SOLN
Freq: Once | INTRAVENOUS | Status: AC
  Administered 2016-06-25: 12:00:00 via INTRAVENOUS

## 2016-06-25 MED ORDER — SODIUM CHLORIDE 0.9 % IJ SOLN
10.0000 mL | INTRAMUSCULAR | Status: DC | PRN
  Administered 2016-06-25: 10 mL via INTRAVENOUS
  Filled 2016-06-25: qty 10

## 2016-06-25 MED ORDER — SODIUM CHLORIDE 0.9% FLUSH
10.0000 mL | INTRAVENOUS | Status: DC | PRN
Start: 2016-06-25 — End: 2016-06-25
  Administered 2016-06-25: 10 mL
  Filled 2016-06-25: qty 10

## 2016-06-25 MED ORDER — ACETAMINOPHEN 325 MG PO TABS
650.0000 mg | ORAL_TABLET | Freq: Once | ORAL | Status: AC
  Administered 2016-06-25: 650 mg via ORAL

## 2016-06-25 MED ORDER — ACETAMINOPHEN 325 MG PO TABS
ORAL_TABLET | ORAL | Status: AC
  Filled 2016-06-25: qty 2

## 2016-06-25 MED ORDER — DIPHENHYDRAMINE HCL 25 MG PO CAPS
ORAL_CAPSULE | ORAL | Status: AC
  Filled 2016-06-25: qty 1

## 2016-06-25 MED ORDER — PERTUZUMAB CHEMO INJECTION 420 MG/14ML
420.0000 mg | Freq: Once | INTRAVENOUS | Status: AC
  Administered 2016-06-25: 420 mg via INTRAVENOUS
  Filled 2016-06-25: qty 14

## 2016-06-25 MED ORDER — TRASTUZUMAB CHEMO 150 MG IV SOLR
6.0000 mg/kg | Freq: Once | INTRAVENOUS | Status: AC
  Administered 2016-06-25: 420 mg via INTRAVENOUS
  Filled 2016-06-25: qty 20

## 2016-06-25 NOTE — Telephone Encounter (Signed)
Gave patient AVS and calender per 06/25/2016 los

## 2016-06-25 NOTE — Patient Instructions (Signed)
Pertuzumab injection What is this medicine? PERTUZUMAB (per TOOZ ue mab) is a monoclonal antibody. It is used to treat breast cancer. This medicine may be used for other purposes; ask your health care provider or pharmacist if you have questions. COMMON BRAND NAME(S): PERJETA What should I tell my health care provider before I take this medicine? They need to know if you have any of these conditions: -heart disease -heart failure -high blood pressure -history of irregular heart beat -recent or ongoing radiation therapy -an unusual or allergic reaction to pertuzumab, other medicines, foods, dyes, or preservatives -pregnant or trying to get pregnant -breast-feeding How should I use this medicine? This medicine is for infusion into a vein. It is given by a health care professional in a hospital or clinic setting. Talk to your pediatrician regarding the use of this medicine in children. Special care may be needed. Overdosage: If you think you have taken too much of this medicine contact a poison control center or emergency room at once. NOTE: This medicine is only for you. Do not share this medicine with others. What if I miss a dose? It is important not to miss your dose. Call your doctor or health care professional if you are unable to keep an appointment. What may interact with this medicine? Interactions are not expected. Give your health care provider a list of all the medicines, herbs, non-prescription drugs, or dietary supplements you use. Also tell them if you smoke, drink alcohol, or use illegal drugs. Some items may interact with your medicine. This list may not describe all possible interactions. Give your health care provider a list of all the medicines, herbs, non-prescription drugs, or dietary supplements you use. Also tell them if you smoke, drink alcohol, or use illegal drugs. Some items may interact with your medicine. What should I watch for while using this medicine? Your  condition will be monitored carefully while you are receiving this medicine. Report any side effects. Continue your course of treatment even though you feel ill unless your doctor tells you to stop. Do not become pregnant while taking this medicine or for 7 months after stopping it. Women should inform their doctor if they wish to become pregnant or think they might be pregnant. Women of child-bearing potential will need to have a negative pregnancy test before starting this medicine. There is a potential for serious side effects to an unborn child. Talk to your health care professional or pharmacist for more information. Do not breast-feed an infant while taking this medicine or for 7 months after stopping it. Women must use effective birth control with this medicine. Call your doctor or health care professional for advice if you get a fever, chills or sore throat, or other symptoms of a cold or flu. Do not treat yourself. Try to avoid being around people who are sick. You may experience fever, chills, and headache during the infusion. Report any side effects during the infusion to your health care professional. What side effects may I notice from receiving this medicine? Side effects that you should report to your doctor or health care professional as soon as possible: -breathing problems -chest pain or palpitations -dizziness -feeling faint or lightheaded -fever or chills -skin rash, itching or hives -sore throat -swelling of the face, lips, or tongue -swelling of the legs or ankles -unusually weak or tired Side effects that usually do not require medical attention (report to your doctor or health care professional if they continue or are bothersome): -diarrhea -hair   loss -nausea, vomiting -tiredness This list may not describe all possible side effects. Call your doctor for medical advice about side effects. You may report side effects to FDA at 1-800-FDA-1088. Where should I keep my  medicine? This drug is given in a hospital or clinic and will not be stored at home. NOTE: This sheet is a summary. It may not cover all possible information. If you have questions about this medicine, talk to your doctor, pharmacist, or health care provider.  2018 Elsevier/Gold Standard (2015-05-12 12:08:50) Trastuzumab injection for infusion What is this medicine? TRASTUZUMAB (tras TOO zoo mab) is a monoclonal antibody. It is used to treat breast cancer and stomach cancer. This medicine may be used for other purposes; ask your health care provider or pharmacist if you have questions. COMMON BRAND NAME(S): Herceptin What should I tell my health care provider before I take this medicine? They need to know if you have any of these conditions: -heart disease -heart failure -lung or breathing disease, like asthma -an unusual or allergic reaction to trastuzumab, benzyl alcohol, or other medications, foods, dyes, or preservatives -pregnant or trying to get pregnant -breast-feeding How should I use this medicine? This drug is given as an infusion into a vein. It is administered in a hospital or clinic by a specially trained health care professional. Talk to your pediatrician regarding the use of this medicine in children. This medicine is not approved for use in children. Overdosage: If you think you have taken too much of this medicine contact a poison control center or emergency room at once. NOTE: This medicine is only for you. Do not share this medicine with others. What if I miss a dose? It is important not to miss a dose. Call your doctor or health care professional if you are unable to keep an appointment. What may interact with this medicine? This medicine may interact with the following medications: -certain types of chemotherapy, such as daunorubicin, doxorubicin, epirubicin, and idarubicin This list may not describe all possible interactions. Give your health care provider a list of  all the medicines, herbs, non-prescription drugs, or dietary supplements you use. Also tell them if you smoke, drink alcohol, or use illegal drugs. Some items may interact with your medicine. What should I watch for while using this medicine? Visit your doctor for checks on your progress. Report any side effects. Continue your course of treatment even though you feel ill unless your doctor tells you to stop. Call your doctor or health care professional for advice if you get a fever, chills or sore throat, or other symptoms of a cold or flu. Do not treat yourself. Try to avoid being around people who are sick. You may experience fever, chills and shaking during your first infusion. These effects are usually mild and can be treated with other medicines. Report any side effects during the infusion to your health care professional. Fever and chills usually do not happen with later infusions. Do not become pregnant while taking this medicine or for 7 months after stopping it. Women should inform their doctor if they wish to become pregnant or think they might be pregnant. Women of child-bearing potential will need to have a negative pregnancy test before starting this medicine. There is a potential for serious side effects to an unborn child. Talk to your health care professional or pharmacist for more information. Do not breast-feed an infant while taking this medicine or for 7 months after stopping it. Women must use effective birth control   with this medicine. What side effects may I notice from receiving this medicine? Side effects that you should report to your doctor or health care professional as soon as possible: -allergic reactions like skin rash, itching or hives, swelling of the face, lips, or tongue -chest pain or palpitations -cough -dizziness -feeling faint or lightheaded, falls -fever -general ill feeling or flu-like symptoms -signs of worsening heart failure like breathing problems;  swelling in your legs and feet -unusually weak or tired Side effects that usually do not require medical attention (report to your doctor or health care professional if they continue or are bothersome): -bone pain -changes in taste -diarrhea -joint pain -nausea/vomiting -weight loss This list may not describe all possible side effects. Call your doctor for medical advice about side effects. You may report side effects to FDA at 1-800-FDA-1088. Where should I keep my medicine? This drug is given in a hospital or clinic and will not be stored at home. NOTE: This sheet is a summary. It may not cover all possible information. If you have questions about this medicine, talk to your doctor, pharmacist, or health care provider.  2018 Elsevier/Gold Standard (2016-04-03 14:37:52)  

## 2016-06-27 ENCOUNTER — Telehealth: Payer: Self-pay | Admitting: Oncology

## 2016-06-27 NOTE — Telephone Encounter (Signed)
Called Mateo Flow with the [employer]. Left a message saying that Taja will not be seen until 07/19/16 so her physical form will not be filled out until then.  Requested a return call.

## 2016-07-09 ENCOUNTER — Ambulatory Visit (HOSPITAL_COMMUNITY)
Admission: RE | Admit: 2016-07-09 | Discharge: 2016-07-09 | Disposition: A | Discharge status: Home / Self Care | Payer: BLUE CROSS/BLUE SHIELD | Source: Ambulatory Visit | Attending: Internal Medicine | Admitting: Internal Medicine

## 2016-07-09 ENCOUNTER — Encounter (HOSPITAL_COMMUNITY): Payer: Self-pay

## 2016-07-09 ENCOUNTER — Ambulatory Visit (HOSPITAL_BASED_OUTPATIENT_CLINIC_OR_DEPARTMENT_OTHER)
Admission: RE | Admit: 2016-07-09 | Discharge: 2016-07-09 | Disposition: A | Discharge status: Home / Self Care | Payer: BLUE CROSS/BLUE SHIELD | Source: Ambulatory Visit | Attending: Cardiology | Admitting: Cardiology

## 2016-07-09 VITALS — BP 148/88 | HR 75 | Wt 155.5 lb

## 2016-07-09 DIAGNOSIS — Z800 Family history of malignant neoplasm of digestive organs: Secondary | ICD-10-CM | POA: Insufficient documentation

## 2016-07-09 DIAGNOSIS — Z803 Family history of malignant neoplasm of breast: Secondary | ICD-10-CM | POA: Insufficient documentation

## 2016-07-09 DIAGNOSIS — Z8249 Family history of ischemic heart disease and other diseases of the circulatory system: Secondary | ICD-10-CM | POA: Insufficient documentation

## 2016-07-09 DIAGNOSIS — I10 Essential (primary) hypertension: Secondary | ICD-10-CM | POA: Insufficient documentation

## 2016-07-09 DIAGNOSIS — C50511 Malignant neoplasm of lower-outer quadrant of right female breast: Secondary | ICD-10-CM | POA: Diagnosis not present

## 2016-07-09 DIAGNOSIS — I341 Nonrheumatic mitral (valve) prolapse: Secondary | ICD-10-CM | POA: Insufficient documentation

## 2016-07-09 DIAGNOSIS — Z801 Family history of malignant neoplasm of trachea, bronchus and lung: Secondary | ICD-10-CM | POA: Insufficient documentation

## 2016-07-09 DIAGNOSIS — Z8042 Family history of malignant neoplasm of prostate: Secondary | ICD-10-CM | POA: Diagnosis not present

## 2016-07-09 DIAGNOSIS — Z9011 Acquired absence of right breast and nipple: Secondary | ICD-10-CM | POA: Diagnosis not present

## 2016-07-09 DIAGNOSIS — K219 Gastro-esophageal reflux disease without esophagitis: Secondary | ICD-10-CM | POA: Insufficient documentation

## 2016-07-09 LAB — ECHOCARDIOGRAM COMPLETE
E decel time: 232 msec
E/e' ratio: 9.73
FS: 37 % (ref 28–44)
IVS/LV PW RATIO, ED: 1.06
LA ID, A-P, ES: 35 mm
LA diam end sys: 35 mm
LA diam index: 1.94 cm/m2
LA vol A4C: 43.1 ml
LA vol index: 23.7 mL/m2
LA vol: 42.6 mL
LV E/e' medial: 9.73
LV E/e'average: 9.73
LV PW d: 7.92 mm — AB (ref 0.6–1.1)
LV e' LATERAL: 11.2 cm/s
LVOT SV: 64 mL
LVOT VTI: 22.4 cm
LVOT area: 2.84 cm2
LVOT diameter: 19 mm
LVOT peak grad rest: 5 mmHg
LVOT peak vel: 109 cm/s
Lateral S' vel: 11.5 cm/s
MV Dec: 232
MV Peak grad: 5 mmHg
MV pk A vel: 63.5 m/s
MV pk E vel: 109 m/s
RV sys press: 23 mmHg
Reg peak vel: 225 cm/s
TAPSE: 28.7 mm
TDI e' lateral: 11.2
TDI e' medial: 11.4
TR max vel: 225 cm/s

## 2016-07-09 NOTE — Patient Instructions (Signed)
We will contact you in 4 months to schedule your next appointment and echocardiogram  

## 2016-07-10 NOTE — Progress Notes (Signed)
Patient ID: Melanie Moss, female   DOB: 06/21/1975, 41 y.o.   MRN: [id number] Oncologist: Dr Burr Medico  41 yo with history of breast cancer presents for cardio-oncology evaluation.  Breast cancer was diagnosed in 4/17, multifocal cancer in right breast.  One biopsied site was ER+/PR+/HER2+.  Other biopsied site was ER+/PR+/HER2-.  She had right mastectomy in 6/17.  She has started chemotherapy with Herceptin to continue x 1 year.  She has now completed radiation.   No chest pain, no exertional dyspnea.  BP overall improved but still high at times (high today).   Labs (4/17): K 4, creatinine 0.8  PMH: 1. Breast cancer: Diagnosed in 4/17, multifocal cancer in right breast.  One biopsied site was ER+/PR+/HER2+.  Other biopsied site was ER+/PR+/HER2-. Right mastectomy in 6/17.  - Echo (4/17): EF 55-60%, GLS -19.9%, lateral s' 13.9 cm/sec - Echo (8/17): EF 65-70%, GLS -19%.  - Echo (12/17): EF 65-70%, GLS -18.2%, normal RV size and systolic function.  - Echo (3/18): EF 40-98%, normal diastolic function, GLS -11.9%, normal RV size and systolic function.  2. Uterine fibroids 3. GERD 4. HTN  Social History   Social History  . Marital status: Single    Spouse name: N/A  . Number of children: 1  . Years of education: N/A   Occupational History  . Not on file.   Social History Main Topics  . Smoking status: Never Smoker  . Smokeless tobacco: Never Used  . Alcohol use No  . Drug use: No  . Sexual activity: Yes   Other Topics Concern  . Not on file   Social History Narrative  . No narrative on file   Family History  Problem Relation Age of Onset  . Hypertension Mother   . Hypertension Father   . Prostate cancer Father 62  . Breast cancer Maternal Aunt 71  . Breast cancer Paternal Aunt 27  . Cirrhosis Maternal Uncle   . Prostate cancer Paternal Uncle   . Throat cancer Maternal Aunt     non smoker  . Prostate cancer Paternal Uncle   . Colon cancer Cousin     maternal first cousin  dx in his 60s  . Lung cancer Cousin     smoker   ROS: All systems reviewed and negative except as per HPI.   BP (!) 148/88   Pulse 75   Wt 155 lb 8 oz (70.5 kg)   SpO2 100%   BMI 27.55 kg/m  General: NAD Neck: JVP 7 cm, no thyromegaly or thyroid nodule.  Lungs: Clear to auscultation bilaterally with normal respiratory effort. CV: Nondisplaced PMI.  Heart regular S1/S2, no S3/S4, no murmur.  No peripheral edema.  No carotid bruit.  Normal pedal pulses.  Abdomen: Soft, nontender, no hepatosplenomegaly, no distention.  Skin: Intact without lesions or rashes.  Neurologic: Alert and oriented x 3.  Psych: Normal affect. Extremities: No clubbing or cyanosis.  HEENT: Normal.   Assessment/Plan: 1. Breast cancer:  She is getting Herceptin-based therapy for a year.  I reviewed today's echo.  It was stable compared to prior echos.  No change to therapy.  She will get her final echo in 6/18 after her last Herceptin dose.  2. HTN: BP continues to run high at times.  Will continue amlodipine 10 mg for now.  If BP high at followup appointment in 6/18, will add HCTZ 25 mg daily.   Loralie Champagne 07/10/2016

## 2016-07-11 MED FILL — TAMOXIFEN 20 MG TABLET: 20 | 30 days supply | Qty: 30 | Fill #2

## 2016-07-12 ENCOUNTER — Other Ambulatory Visit: Payer: Self-pay | Admitting: Hematology

## 2016-07-16 ENCOUNTER — Ambulatory Visit (HOSPITAL_BASED_OUTPATIENT_CLINIC_OR_DEPARTMENT_OTHER): Payer: BLUE CROSS/BLUE SHIELD

## 2016-07-16 ENCOUNTER — Other Ambulatory Visit (HOSPITAL_BASED_OUTPATIENT_CLINIC_OR_DEPARTMENT_OTHER): Payer: BLUE CROSS/BLUE SHIELD

## 2016-07-16 ENCOUNTER — Encounter: Payer: Self-pay | Admitting: Hematology

## 2016-07-16 ENCOUNTER — Ambulatory Visit: Payer: BLUE CROSS/BLUE SHIELD

## 2016-07-16 VITALS — BP 132/94 | HR 81 | Temp 98.5°F | Resp 18

## 2016-07-16 DIAGNOSIS — Z5112 Encounter for antineoplastic immunotherapy: Secondary | ICD-10-CM

## 2016-07-16 DIAGNOSIS — Z170 Estrogen receptor positive status [ER+]: Principal | ICD-10-CM

## 2016-07-16 DIAGNOSIS — D500 Iron deficiency anemia secondary to blood loss (chronic): Secondary | ICD-10-CM

## 2016-07-16 DIAGNOSIS — C50511 Malignant neoplasm of lower-outer quadrant of right female breast: Secondary | ICD-10-CM

## 2016-07-16 LAB — COMPREHENSIVE METABOLIC PANEL
ALT: 16 U/L (ref 0–55)
AST: 22 U/L (ref 5–34)
Albumin: 3.9 g/dL (ref 3.5–5.0)
Alkaline Phosphatase: 52 U/L (ref 40–150)
Anion Gap: 9 mEq/L (ref 3–11)
BUN: 9.9 mg/dL (ref 7.0–26.0)
CO2: 26 mEq/L (ref 22–29)
Calcium: 9.4 mg/dL (ref 8.4–10.4)
Chloride: 107 mEq/L (ref 98–109)
Creatinine: 0.8 mg/dL (ref 0.6–1.1)
EGFR: >90 mL/min/{1.73_m2} (ref >90)
Glucose: 110 mg/dl (ref 70–140)
Potassium: 3.4 mEq/L — ABNORMAL LOW (ref 3.5–5.1)
Sodium: 142 mEq/L (ref 136–145)
Total Bilirubin: 0.28 mg/dL (ref 0.20–1.20)
Total Protein: 7.2 g/dL (ref 6.4–8.3)

## 2016-07-16 LAB — CBC WITH DIFFERENTIAL/PLATELET
BASO%: 0.4 % (ref 0.0–2.0)
Basophils Absolute: 0 10*3/uL (ref 0.0–0.1)
EOS%: 1.7 % (ref 0.0–7.0)
Eosinophils Absolute: 0.1 10*3/uL (ref 0.0–0.5)
HCT: 30.4 % — ABNORMAL LOW (ref 34.8–46.6)
HGB: 10.4 g/dL — ABNORMAL LOW (ref 11.6–15.9)
LYMPH%: 35.1 % (ref 14.0–49.7)
MCH: 31.3 pg (ref 25.1–34.0)
MCHC: 34.2 g/dL (ref 31.5–36.0)
MCV: 91.7 fL (ref 79.5–101.0)
MONO#: 0.5 10*3/uL (ref 0.1–0.9)
MONO%: 6.1 % (ref 0.0–14.0)
NEUT#: 4.3 10*3/uL (ref 1.5–6.5)
NEUT%: 56.7 % (ref 38.4–76.8)
Platelets: 230 10*3/uL (ref 145–400)
RBC: 3.32 10*6/uL — ABNORMAL LOW (ref 3.70–5.45)
RDW: 12.5 % (ref 11.2–14.5)
WBC: 7.6 10*3/uL (ref 3.9–10.3)
lymph#: 2.7 10*3/uL (ref 0.9–3.3)

## 2016-07-16 MED ORDER — DIPHENHYDRAMINE HCL 25 MG PO CAPS
50.0000 mg | ORAL_CAPSULE | Freq: Once | ORAL | Status: AC
  Administered 2016-07-16: 25 mg via ORAL

## 2016-07-16 MED ORDER — ACETAMINOPHEN 325 MG PO TABS
ORAL_TABLET | ORAL | Status: AC
  Filled 2016-07-16: qty 2

## 2016-07-16 MED ORDER — SODIUM CHLORIDE 0.9 % IV SOLN
Freq: Once | INTRAVENOUS | Status: AC
  Administered 2016-07-16: 10:00:00 via INTRAVENOUS

## 2016-07-16 MED ORDER — SODIUM CHLORIDE 0.9% FLUSH
10.0000 mL | INTRAVENOUS | Status: DC | PRN
  Administered 2016-07-16: 10 mL
  Filled 2016-07-16: qty 10

## 2016-07-16 MED ORDER — TRASTUZUMAB CHEMO 150 MG IV SOLR
6.0000 mg/kg | Freq: Once | INTRAVENOUS | Status: AC
  Administered 2016-07-16: 420 mg via INTRAVENOUS
  Filled 2016-07-16: qty 20

## 2016-07-16 MED ORDER — HEPARIN SOD (PORK) LOCK FLUSH 100 UNIT/ML IV SOLN
500.0000 [IU] | Freq: Once | INTRAVENOUS | Status: AC | PRN
  Administered 2016-07-16: 500 [IU]
  Filled 2016-07-16: qty 5

## 2016-07-16 MED ORDER — ACETAMINOPHEN 325 MG PO TABS
650.0000 mg | ORAL_TABLET | Freq: Once | ORAL | Status: AC
  Administered 2016-07-16: 650 mg via ORAL

## 2016-07-16 MED ORDER — PERTUZUMAB CHEMO INJECTION 420 MG/14ML
420.0000 mg | Freq: Once | INTRAVENOUS | Status: AC
  Administered 2016-07-16: 420 mg via INTRAVENOUS
  Filled 2016-07-16: qty 14

## 2016-07-16 MED ORDER — DIPHENHYDRAMINE HCL 25 MG PO CAPS
ORAL_CAPSULE | ORAL | Status: AC
  Filled 2016-07-16: qty 1

## 2016-07-16 MED ORDER — SODIUM CHLORIDE 0.9 % IJ SOLN
10.0000 mL | INTRAMUSCULAR | Status: DC | PRN
  Administered 2016-07-16: 10 mL via INTRAVENOUS
  Filled 2016-07-16: qty 10

## 2016-07-16 NOTE — Patient Instructions (Signed)
[facility] Discharge Instructions for Patients Receiving Chemotherapy  Today you received the following chemotherapy agents Herceptin and Perjeta   To help prevent nausea and vomiting after your treatment, we encourage you to take your nausea medication as directed.    If you develop nausea and vomiting that is not controlled by your nausea medication, call the clinic.   BELOW ARE SYMPTOMS THAT SHOULD BE REPORTED IMMEDIATELY:  *FEVER GREATER THAN 100.5 F  *CHILLS WITH OR WITHOUT FEVER  NAUSEA AND VOMITING THAT IS NOT CONTROLLED WITH YOUR NAUSEA MEDICATION  *UNUSUAL SHORTNESS OF BREATH  *UNUSUAL BRUISING OR BLEEDING  TENDERNESS IN MOUTH AND THROAT WITH OR WITHOUT PRESENCE OF ULCERS  *URINARY PROBLEMS  *BOWEL PROBLEMS  UNUSUAL RASH Items with * indicate a potential emergency and should be followed up as soon as possible.  Feel free to call the clinic you have any questions or concerns. The clinic phone number is [phone].  Please show the CHEMO ALERT CARD at check-in to the Emergency Department and triage nurse.   

## 2016-07-19 ENCOUNTER — Ambulatory Visit
Admission: RE | Admit: 2016-07-19 | Discharge: 2016-07-19 | Disposition: A | Discharge status: Home / Self Care | Payer: BLUE CROSS/BLUE SHIELD | Source: Ambulatory Visit | Attending: Radiation Oncology | Admitting: Radiation Oncology

## 2016-07-19 ENCOUNTER — Encounter: Payer: Self-pay | Admitting: Radiation Oncology

## 2016-07-19 DIAGNOSIS — C50511 Malignant neoplasm of lower-outer quadrant of right female breast: Secondary | ICD-10-CM | POA: Diagnosis present

## 2016-07-19 DIAGNOSIS — Y842 Radiological procedure and radiotherapy as the cause of abnormal reaction of the patient, or of later complication, without mention of misadventure at the time of the procedure: Secondary | ICD-10-CM | POA: Diagnosis not present

## 2016-07-19 DIAGNOSIS — Z170 Estrogen receptor positive status [ER+]: Secondary | ICD-10-CM | POA: Insufficient documentation

## 2016-07-19 NOTE — Progress Notes (Signed)
Melanie Moss is here for follow up. She report having occasional discomfort in her right chest from her expander.  She said it is worse when she is laying down at night.  She reports having swelling on and off in her right arm and wears a lymphedema sleeve as needed.  She is taking Tamoxifen.  She reports having fatigue.  She is planning to return to work in 2 weeks and is wondering if Dr. Sondra Come can sign a return to work release.  The skin on her right chest has hyperpigmentation.  She is using vitamin E cream.  BP 135/85 (BP Location: Left Arm, Patient Position: Sitting)   Pulse 86   Temp 98 F (36.7 C) (Oral)   Ht 5\' 3"  (1.6 m)   Wt 156 lb 6.4 oz (70.9 kg)   SpO2 99%   BMI 27.71 kg/m    Wt Readings from Last 3 Encounters:  07/19/16 156 lb 6.4 oz (70.9 kg)  07/09/16 155 lb 8 oz (70.5 kg)  06/25/16 157 lb 1.6 oz (71.3 kg)

## 2016-07-19 NOTE — Progress Notes (Signed)
Radiation Oncology         [phone] ________________________________  Name: Melanie Moss MRN: [id number]  Date: 07/19/2016  DOB: 1976-04-18  Follow-Up Visit Note  CC: Elyn Peers, MD  Excell Seltzer, MD    ICD-9-CM ICD-10-CM   1. 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  Narrative:  The patient returns today for routine follow-up of radiation completed 04/27/16.    On review of systems, the patient reports occasional discomfort in her right chest from her expander. She said it is worse when she is laying down at night. She reports having swelling on and off in her right arm and wears a lymphedema sleeve as needed. She reports having fatigue due to difficulty sleeping at night. She is using vitamin E cream.   The patient is planning to return to work in 2 weeks requests a signature on her return to work release paperwork.     She is taking Tamoxifen at this time. She reports she will continue Herceptin until June.             ALLERGIES:  has No Known Allergies.  Meds: Current Outpatient Prescriptions  Medication Sig Dispense Refill  . amLODipine (NORVASC) 10 MG tablet Take 1 tablet (10 mg total) by mouth at bedtime. 30 tablet 3  . B Complex-C (SUPER B COMPLEX PO) Take 1 tablet by mouth daily.    . diphenhydrAMINE (BENADRYL) 12.5 MG/5ML elixir Take 5 mLs (12.5 mg total) by mouth every 6 (six) hours as needed for itching. 120 mL 0  . Elastic Bandages & Supports (KNEE COMPRESSION SLEEVE/L/XL)  MISC   0  . ferrous sulfate 325 (65 FE) MG tablet Take 325 mg by mouth 2 (two) times daily with a meal.    . ibuprofen (ADVIL,MOTRIN) 200 MG tablet Take 200 mg by mouth every 6 (six) hours as needed.    . Multiple Vitamin (MULTIVITAMIN) tablet Take 1 tablet by mouth daily.    . ondansetron (ZOFRAN-ODT) 4 MG disintegrating tablet Take 4 mg by mouth every 8 (eight) hours as needed for nausea or vomiting. Reported on 11/01/2015  0  . polyethylene glycol (MIRALAX / GLYCOLAX) packet Take 17 g by mouth daily as needed for mild constipation. 14 each 0  . senna (SENOKOT) 8.6 MG TABS tablet Take 1 tablet (8.6 mg total) by mouth 2 (two) times daily. (Patient taking differently: Take 1 tablet by mouth daily as needed. ) 120 each 0  . tamoxifen (NOLVADEX) 20 MG tablet TAKE 1 TABLET BY MOUTH ONCE DAILY 30 tablet 2  . venlafaxine XR (EFFEXOR XR) 37.5 MG 24 hr capsule Take 1 capsule (37.5 mg total) by mouth daily with breakfast. 30 capsule 1  . emollient (BIAFINE) cream Apply topically as needed.    . hyaluronate sodium (RADIAPLEXRX) GEL Apply 1 application topically 2 (two) times daily.    Marland Kitchen HYDROcodone-acetaminophen (NORCO/VICODIN) 5-325 MG tablet Take 1 tablet by mouth every 8 (eight) hours as needed for moderate pain. (Patient not taking: Reported on 07/09/2016)  20 tablet 0  . hydrocortisone 2.5 % cream Apply topically 2 (two) times daily. (Patient not taking: Reported on 07/19/2016) 30 g 1  . LORazepam (ATIVAN) 0.5 MG tablet Take 1 tablet (0.5 mg total) by mouth at bedtime as needed for anxiety (nausea). (Patient not taking: Reported on 07/19/2016) 30 tablet 0  . non-metallic deodorant (ALRA) MISC Apply 1 application topically daily as needed.    . valsartan-hydrochlorothiazide (DIOVAN-HCT) 160-12.5 MG tablet Take 1 tablet by mouth every morning.  0   No current facility-administered medications for this encounter.    Facility-Administered Medications Ordered in Other Encounters  Medication Dose Route  Frequency Provider Last Rate Last Dose  . 0.9 %  sodium chloride infusion   Intravenous Once Truitt Merle, MD      . alteplase (CATHFLO ACTIVASE) injection 2 mg  2 mg Intracatheter Once PRN Truitt Merle, MD      . heparin lock flush 100 unit/mL  250 Units Intracatheter Once PRN Truitt Merle, MD      . heparin lock flush 100 unit/mL  500 Units Intravenous Once PRN Truitt Merle, MD      . sodium chloride 0.9 % injection 10 mL  10 mL Intracatheter PRN Truitt Merle, MD      . sodium chloride 0.9 % injection 10 mL  10 mL Intravenous PRN Truitt Merle, MD      . sodium chloride 0.9 % injection 3 mL  3 mL Intravenous Once PRN Truitt Merle, MD      . sodium chloride flush (NS) 0.9 % injection 10 mL  10 mL Intravenous PRN Truitt Merle, MD        Physical Findings: The patient is in no acute distress. Patient is alert and oriented.  height is _0  (1.6 m) and weight is 156 lb 6.4 oz (70.9 kg). Her oral temperature is 98 F (36.7 C). Her blood pressure is 135/85 and her pulse is 86. Her oxygen saturation is 99%.  No significant changes. Lungs clear to auscultation bilaterally. Heart is regular in rate and rhythm. No supraclavicular, cervical, or axillary adenopathy appreciated. Edema to the right arm; she removed her lymphedema sleeve for exam today.  Left breast large and pendulous without mass or nipple discharge. PAC in place in left upper chest. Right chest shows tissue expander in position. Hyperpigmentation changes throughout the right chest. No palpable or visibile signs of recurrence.  Lab Findings: Lab Results  Component Value Date   WBC 7.6 07/16/2016   HGB 10.4 (L) 07/16/2016   HCT 30.4 (L) 07/16/2016   MCV 91.7 07/16/2016   PLT 230 07/16/2016    Radiographic Findings: No results found.  Impression:  The patient is recovering from the effects of radiation. She is eager to return to work on 08/06/16.   Plan:  I encouraged the patient to use Melatonin as needed for sleep aid. I was happy to sign the patient's return  to work paperwork. I encouraged her to take time off as needed for fatigue as she continues to recover.  The patient will continue to follow with medical oncology as indicated. The patient will follow up with me in September following her surgical reconstruction.   -----------------------------------  Blair Promise, PhD, MD  This document serves as a record of services personally performed by [name], MD. It was created on his behalf by Maryla Morrow, a trained medical scribe. The creation of this record is based on the scribe's personal observations and the provider's statements to  them. This document has been checked and approved by the attending provider.

## 2016-07-24 ENCOUNTER — Ambulatory Visit: Payer: BLUE CROSS/BLUE SHIELD | Admitting: Physical Therapy

## 2016-07-25 ENCOUNTER — Ambulatory Visit: Payer: BLUE CROSS/BLUE SHIELD | Attending: General Surgery | Admitting: Physical Therapy

## 2016-07-25 DIAGNOSIS — I972 Postmastectomy lymphedema syndrome: Secondary | ICD-10-CM | POA: Insufficient documentation

## 2016-07-25 DIAGNOSIS — G8929 Other chronic pain: Secondary | ICD-10-CM | POA: Insufficient documentation

## 2016-07-25 DIAGNOSIS — M25611 Stiffness of right shoulder, not elsewhere classified: Secondary | ICD-10-CM

## 2016-07-25 DIAGNOSIS — M25511 Pain in right shoulder: Secondary | ICD-10-CM | POA: Insufficient documentation

## 2016-07-25 NOTE — Therapy (Signed)
[place], [place], [address] Phone: [phone]   Fax:  [phone]  Physical Therapy Evaluation  Patient Details  Name: Melanie Moss MRN: [id number] Date of Birth: 06-Nov-1975 Referring Provider: Dr. Excell Seltzer   Encounter Date: 07/25/2016      PT End of Session - 07/25/16 1414    Visit Number 1   Number of Visits 13   Date for PT Re-Evaluation 08/31/16   PT Start Time 5102   PT Stop Time 1400   PT Time Calculation (min) 55 min   Activity Tolerance Patient tolerated treatment well   Behavior During Therapy [facility] for tasks assessed/performed      Past Medical History:  Diagnosis Date  . Anemia   . Anxiety   . Breast cancer of lower-outer quadrant of right female breast ([place]) 07/29/2015  . Family history of breast cancer   . Family history of colon cancer   . Family history of prostate cancer   . GERD (gastroesophageal reflux disease)    tums   . Gestational diabetes   . Hyperlipidemia   . Hypertension    not currently on medication  . Preterm labor     Past Surgical History:  Procedure Laterality Date  . AXILLARY LYMPH NODE DISSECTION Right 09/29/2015   Procedure: AXILLARY LYMPH NODE DISSECTION;  Surgeon: Excell Seltzer, MD;  Location: [place];  Service: General;  Laterality: Right;  . BREAST RECONSTRUCTION WITH PLACEMENT OF TISSUE EXPANDER AND FLEX HD (ACELLULAR HYDRATED DERMIS) Right 09/29/2015   Procedure: BREAST RECONSTRUCTION WITH PLACEMENT OF TISSUE EXPANDER AND FLEX HD (ACELLULAR HYDRATED DERMIS);  Surgeon: Loel Lofty Dillingham, DO;  Location: [place];  Service: Plastics;  Laterality: Right;  . CERVICAL CERCLAGE  02/19/2011   Procedure: CERCLAGE CERVICAL;  Surgeon: Agnes Lawrence, MD;  Location: [facility];  Service: Gynecology;  Laterality: N/A;  . DILATION AND CURETTAGE OF UTERUS    . MASTECTOMY WITH AXILLARY LYMPH NODE DISSECTION Right 09/29/2015  . PORTACATH PLACEMENT N/A 09/29/2015   Procedure: INSERTION PORT-A-CATH;  Surgeon: Excell Seltzer, MD;  Location: [place];  Service: General;  Laterality: N/A;  . SIMPLE MASTECTOMY WITH AXILLARY SENTINEL NODE BIOPSY Right 09/29/2015   Procedure: RIGHT TOTAL  MASTECTOMY WITH AXILLARY SENTINEL NODE BIOPSY;  Surgeon: Excell Seltzer, MD;  Location: [place];  Service: General;  Laterality: Right;    There were no vitals filed for this visit.       Subjective Assessment - 07/25/16 1319    Subjective Pt comes in with persistent swelling in right arm that has not resolved with continuous use of bella strong sleeve the swelling goes up into chest in front of axilla    Pertinent History June 8th right side mastectomy and full axillary lymph node dissection, has had chemotherapy and radiation pt currently has an expander on R.  wear compression sleeve and gauntlet to try to control lymphedema   Patient Stated Goals to get swelling under control so she can get back to work    Currently in Pain? Yes  at night when she sleeps on it.    Pain Score 4    Pain Location Axilla   Pain Orientation Right   Pain Descriptors / Indicators Sore   Pain Type Chronic pain   Pain Onset More than a month ago   Pain Frequency Intermittent   Aggravating Factors  gets worse at night when she rolls over onto right side    Pain Relieving Factors nothing  Effect of Pain on Daily Activities trouble sleeping             [facility] PT Assessment - 07/25/16 0001      Assessment   Medical Diagnosis right sided breast cancer   Referring Provider Dr. Excell Seltzer    Onset Date/Surgical Date 09/29/15   Hand Dominance Right   Prior Therapy none     Precautions   Precautions Other (comment)  lymphedema with full axillary node dissection      Restrictions   Weight Bearing Restrictions No     Balance Screen   Has the patient fallen in the past 6 months No   Has the patient had a decrease in activity level because of a fear of falling?  No   Is the patient  reluctant to leave their home because of a fear of falling?  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  Expander is in place on right chest with contracted scar above expander and below swelling.   she has flatness in between scapula with scapular dyskinesia   Other Surveys  --  lymphedema Life impact scale 47 or 69% impaired      Sensation   Light Touch Not tested     Coordination   Gross Motor Movements are Fluid and Coordinated Yes     Posture/Postural Control   Posture/Postural Control Postural limitations   Postural Limitations Increased lumbar lordosis;Decreased thoracic kyphosis     AROM   Right Shoulder Flexion 136 Degrees   Right Shoulder ABduction 160 Degrees   Right Shoulder Internal Rotation 30 Degrees  in supine    Right Shoulder External Rotation 50 Degrees  in supine   Left Shoulder Flexion --   Left Shoulder ABduction --   Left Shoulder Internal Rotation --   Left Shoulder External Rotation --     Strength   Right/Left Shoulder Right;Left   Right Shoulder Flexion 3-/5   Right Shoulder ABduction 3-/5   Right Shoulder Internal Rotation 3-/5  limited by pain    Right Shoulder  External Rotation 3-/5  limited by pain      Palpation   Palpation comment pitting edema especially in right forearm that is warm to touch, not painful, pt states this has been longstanding. tightness with decreased tissue excursion above expander            LYMPHEDEMA/ONCOLOGY QUESTIONNAIRE - 07/25/16 1407      Type   Cancer Type right breast cancer     Surgeries   Mastectomy Date 09/29/15   Axillary Lymph Node Dissection Date 09/29/15   Number Lymph  Nodes Removed --  pt reports all lymph nodes were removed      Treatment   Past Chemotherapy Treatment Yes   Past Radiation Treatment Yes   Current Hormone Treatment Yes     What other symptoms do you have   Are you Having Heaviness or Tightness Yes   Are you having Pain Yes   Are you having pitting edema Yes   Body Site right forearm and into axilla    Is it Hard or Difficult finding clothes that fit Yes   Do you have infections No   Is there Decreased scar mobility Yes   [place] Sign No     Lymphedema Stage   Stage STAGE 2 SPONTANEOUSLY IRREVERSIBLE     Right Upper Extremity Lymphedema   15 cm Proximal to Olecranon Process 30.7 cm   10 cm Proximal to Olecranon Process 31 cm   Olecranon Process 28 cm   15 cm Proximal to Ulnar Styloid Process 28.5 cm   10 cm Proximal to Ulnar Styloid Process 24.5 cm   Just Proximal to Ulnar Styloid Process 17.5 cm   Across Hand at [employer] 19.5 cm   At [place] of 2nd Digit 5.8 cm     Left Upper Extremity Lymphedema   15 cm Proximal to Olecranon Process 28.5 cm   10 cm Proximal to Olecranon Process 28 cm   Olecranon Process 24 cm   15 cm Proximal to Ulnar Styloid Process 23.4 cm   10 cm Proximal to Ulnar Styloid Process 20.5 cm   Just Proximal to Ulnar Styloid Process 15.6 cm   Across Hand at [employer] 19 cm   At [place] of 2nd Digit 5.5 cm                [facility] Adult PT Treatment/Exercise - 07/25/16 0001      Manual Therapy   Manual Therapy Compression  Bandaging   Compression Bandaging biotone, then thick stockineet, elastomull top fingere 2-4, 1 artiflex, 1 6, 1-8 and 2-10 cm bandages from hand to axilla                    Short Term Clinic Goals - 12/07/15 1200      CC Short Term Goal  #1   Title Pt to demonstrate 100 degrees of right shoulder flexion to allow her to reach items overhead.    Baseline 71, 108 degrees 10/1015   Time 4   Period Weeks   Status Achieved     CC Short Term Goal  #2   Title Pt to demonstrate 52 degrees of right shoulder abduction to allow pt to reach out to sides.   Baseline 52, 90 degrees 10/31/15   Time 4   Period Weeks   Status Achieved     CC Short Term Goal  #3   Title Pt will be independent in stating lymphedema risk reduction practices to decrease risk of lymphedema   Time 4   Period Weeks   Status Achieved             Long Term Clinic Goals - 07/25/16 1422      CC Long Term Goal  #1   Title Pt will reduce circumference of right forearm at 10 cm proximal to ulnar styloid to 22 cm    Baseline 24.6cm   Time 4   Period Weeks   Status New     CC Long Term Goal  #2   Title  Pt will reduce circumference of right forearm at 15 cm proximal to ulnar styloid to 26 cm    Baseline 28.5   Time 4   Period Weeks   Status New     CC Long Term Goal  #3   Title Pt will demonstrate 150 degrees of shoulder flexion to allow pt to reach items off a high shelf    Baseline 136   Time 4   Period Weeks   Status New     CC Long Term Goal  #4   Title Pt will demonstrate at least 50 degrees of right shoulder internal  rotation to all her to return to prior level of function   Baseline 30     CC Long Term Goal  #5   Title Pt will be independent in a home exercise program for continued strengthening and stretching    Time 4   Period Weeks   Status New            Plan - 07/25/16 1415    Clinical Impression Statement Pt has had persistent lymphedema in right arm that has not been  relieved with consistent use of conservative treatment circular knit garment and exercise .  She will need complete decongestive theray with manual lymph drainage,  bandaging, exercise and good skin care in addititon to strengthening and range of motion exercise to right shoulder.  She would be a good candidate for the circaid reduction kit so that she could manage her daily life , family care and back to work while maintaining reduction compression.  She would benefit from Flexitouch compression pump as she is having edema in her trunk also .  Due to cormorbidities and evolving status of lymphedema this is a moderate complex eval    Rehab Potential Good   Clinical Impairments Affecting Rehab Potential  full axillary node dissection, previous chemo and radiation.    PT Frequency 3x / week   PT Duration 4 weeks   PT Treatment/Interventions Manual techniques;Therapeutic exercise;Passive range of motion;Patient/family education;Compression bandaging;Vasopneumatic Device;ADLs/Self Care Home Management;DME Instruction;Therapeutic activities;Orthotic Fit/Training;Manual lymph drainage;Scar mobilization   PT Next Visit Plan Remeasure, perform manual lymph draiange and  compression bandaging.  supine scapular exercise and joint mobilizaion to right shoulder to increase glenohoumeral  rotation    Consulted and Agree with Plan of Care Patient      Patient will benefit from skilled therapeutic intervention in order to improve the following deficits and impairments:  Decreased range of motion, Pain, Impaired UE functional use, Increased fascial restricitons, Decreased strength, Decreased knowledge of use of DME, Decreased knowledge of precautions, Decreased scar mobility, Increased edema  Visit Diagnosis: Stiffness of right shoulder, not elsewhere classified  Chronic right shoulder pain  Postmastectomy lymphedema     Problem List Patient Active Problem List   Diagnosis Date Noted  . Genetic testing  08/16/2015  . Iron deficiency anemia due to chronic blood loss 08/03/2015  . Family history of breast cancer   . Family history of colon cancer   . Family history of prostate cancer   . Breast cancer of lower-outer quadrant of right female breast ([place]) 07/29/2015  . Leiomyoma of uterus, unspecified 08/01/2012  . Irregular menstrual cycle 08/01/2012  . 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  . HTN (hypertension) 12/22/2010  . Anemia 12/22/2010  . Acid reflux 12/22/2010   Donato Heinz. Owens Shark PT  [place] Levo 07/25/2016, 2:28 PM  [place], [place], [address] Phone:  [phone]   Fax:  [phone]  Name: Melanie Moss MRN: [id number] Date of Birth: [birth year]

## 2016-07-25 NOTE — Patient Instructions (Signed)
First of all, check with your insurance company to see if provider is in [facility]                                            [address]  [place], [place] [address] [phone]    Does not file for insurance--- call for appointment with [facility]   (for wigs and compression sleeves / gloves/gauntlets )  [place], [place] [address] [phone]  Will file some insurances --- call for appointment   Second to [facility] (for mastectomy prosthetics and garments) [place], [place] [address] [phone] Will file some insurances --- call for appointment  [facility]  [address]  [place], [place] [address] [phone] Lower extremity garments  [facility] [address] [place], [place]  [address] [place] Sales rep:  Kern [place]:  [phone] [email or web address] Biocompression pumps   [employer]  Sales rep: Donneta Romberg:  [phone] [email or web address] [place] Pulley and Flexitouch pumps    Other Resources: National Lymphedema Network:  www.lymphnet.org www.[email or web address].com for patient articles and purchase a self manual lymph drainage DVD [email or web address] has informative articles.    PLEASE KEEP YOUR BANDAGES ON AS LONG AS POSSIBLE TO GET THE BEST SWELLING REDUCTION. Should your bandages become uncomfortable or feel too tight, follow these steps: 1. Elevate your extremity higher than your heart.  2. Try to move your arm or leg joints against the firmness of the bandage to help with moving the fluid and allow the bandages to loosen a bit.  3. If the bandaging is still is too tight, it is ok to carefully remove the top layer.  There will still be more layers under it that can provide compression to your extremity. 4. Finally, if you STILL have significant pain after trying these steps, it is ok to take the bandage off.   Check your skin carefully for any signs of irritation  5. PLEASE bring ALL bandage materials back to your next appointment as we will reuse what we can TAKE CARE OF YOUR BANDAGES SO THEY WILL LAST LONGER AND STAY IN BETTER CONDITION Washing bandages:  Wash periodically using a mild detergent in warm water.  Do not use fabric softener or bleach.  Place bandages in a mesh lingerie bag or in a tied off pillow case and use the gentle cycle of the washing machine or hand wash. If you hand wash, you may want to put them in the spin cycle of your washer to get the extra water out, but make sure you put them in a mesh bag first. Do not wring or stretch them while they are wet.  Drying bandages: Lay the bandages out smoothly on a towel away from direct sunlight or heating sources that can damage the fabric. Rolling bandages in a towel and gently squeezing the towel to remove excess water before laying them out can speed up the process.  If you use a drying rack, place a towel on top of the rack to lay the bandages on.  If they hang down to dry, they fabric could be stretched out and the bandage will lose its compression.   Or, keep bandages in the mesh bag and dry them in the dryer on the low or no heat cycle. Rolling bandages: Please roll your bandages  after drying them so they are ready for your next treatment. If they are rolled too loose, they will be difficult to apply.  If rolled too tight, they can get stretched out.   TAKE CARE OF YOUR SKIN 1. Apply a low pH moisturizing lotion to your skin daily 2. Avoid scratching your skin 3. Treat skin irritations quickly  4. Know the 5 warning signs of infection: redness, pain, warmth to touch, fever and increased swelling.  Call your physician immediately if you notice any of these signs of a possible infection.

## 2016-07-26 ENCOUNTER — Telehealth: Payer: Self-pay | Admitting: *Deleted

## 2016-07-26 NOTE — Telephone Encounter (Signed)
Pt called wanting to inform Dr. Burr Medico re:  Pt will start PT for lymphedema  On  07/27/16 at [facility] office. Pt's    Phone      [phone].

## 2016-07-27 ENCOUNTER — Encounter: Payer: Self-pay | Admitting: Physical Therapy

## 2016-07-27 ENCOUNTER — Ambulatory Visit: Payer: BLUE CROSS/BLUE SHIELD | Admitting: Physical Therapy

## 2016-07-27 DIAGNOSIS — M25611 Stiffness of right shoulder, not elsewhere classified: Secondary | ICD-10-CM | POA: Diagnosis not present

## 2016-07-27 DIAGNOSIS — I972 Postmastectomy lymphedema syndrome: Secondary | ICD-10-CM

## 2016-07-27 NOTE — Therapy (Signed)
[place], [place], [address] Phone: [phone]   Fax:  [phone]  Physical Therapy Treatment  Patient Details  Name: Melanie Moss MRN: [id number] Date of Birth: [birth year] Referring Provider: Dr. Excell Seltzer   Encounter Date: 07/27/2016      PT End of Session - 07/27/16 0852    Visit Number 2   Number of Visits 13   Date for PT Re-Evaluation 08/31/16   PT Start Time 0802   PT Stop Time 0846   PT Time Calculation (min) 44 min   Activity Tolerance Patient tolerated treatment well   Behavior During Therapy [facility] for tasks assessed/performed      Past Medical History:  Diagnosis Date  . Anemia   . Anxiety   . Breast cancer of lower-outer quadrant of right female breast ([place]) 07/29/2015  . Family history of breast cancer   . Family history of colon cancer   . Family history of prostate cancer   . GERD (gastroesophageal reflux disease)    tums   . Gestational diabetes   . Hyperlipidemia   . Hypertension    not currently on medication  . Preterm labor     Past Surgical History:  Procedure Laterality Date  . AXILLARY LYMPH NODE DISSECTION Right 09/29/2015   Procedure: AXILLARY LYMPH NODE DISSECTION;  Surgeon: Excell Seltzer, MD;  Location: [place];  Service: General;  Laterality: Right;  . BREAST RECONSTRUCTION WITH PLACEMENT OF TISSUE EXPANDER AND FLEX HD (ACELLULAR HYDRATED DERMIS) Right 09/29/2015   Procedure: BREAST RECONSTRUCTION WITH PLACEMENT OF TISSUE EXPANDER AND FLEX HD (ACELLULAR HYDRATED DERMIS);  Surgeon: Loel Lofty Dillingham, DO;  Location: [place];  Service: Plastics;  Laterality: Right;  . CERVICAL CERCLAGE  02/19/2011   Procedure: CERCLAGE CERVICAL;  Surgeon: Agnes Lawrence, MD;  Location: [facility];  Service: Gynecology;  Laterality: N/A;  . DILATION AND CURETTAGE OF UTERUS    . MASTECTOMY WITH AXILLARY LYMPH NODE DISSECTION Right 09/29/2015  . PORTACATH PLACEMENT N/A 09/29/2015   Procedure: INSERTION PORT-A-CATH;  Surgeon: Excell Seltzer, MD;  Location: [place];  Service: General;  Laterality: N/A;  . SIMPLE MASTECTOMY WITH AXILLARY SENTINEL NODE BIOPSY Right 09/29/2015   Procedure: RIGHT TOTAL  MASTECTOMY WITH AXILLARY SENTINEL NODE BIOPSY;  Surgeon: Excell Seltzer, MD;  Location: [place];  Service: General;  Laterality: Right;    There were no vitals filed for this visit.      Subjective Assessment - 07/27/16 0806    Subjective My arm was itching so bad. I had to take the bandages off but then I put them back on. I had to put the compression sleeve on under the bandages to get it to stop itching.    Pertinent History June 8th right side mastectomy and full axillary lymph node dissection, has had chemotherapy and radiation pt currently has an expander on R.  wear compression sleeve and gauntlet to try to control lymphedema   Patient Stated Goals to get swelling under control so she can get back to work    Currently in Pain? No/denies   Pain Score 0-No pain               LYMPHEDEMA/ONCOLOGY QUESTIONNAIRE - 07/27/16 0816      Right Upper Extremity Lymphedema   15 cm Proximal to Olecranon Process 29.2 cm   10 cm Proximal to Olecranon Process 29.5 cm   Olecranon Process 26 cm   15 cm Proximal to Ulnar Styloid Process 24.3  cm   10 cm Proximal to Ulnar Styloid Process 21.8 cm   Just Proximal to Ulnar Styloid Process 16 cm   Across Hand at [employer] 18.8 cm   At [place] of 2nd Digit 6.4 cm                  [facility] Adult PT Treatment/Exercise - 07/27/16 0001      Manual Therapy   Manual Therapy Compression Bandaging   Compression Bandaging Instructed pt in finger and arm bandaging today as follows and had pt demonstrate correct way to don bandages: biotone, thick stockinette from hand to [place], artiflex from hand to axilla, elastomull to fingers 1-5, 1 6cm at hand, 1- 8 cm , and 2 - 10 cm from hand to axilla                         Long Term Clinic Goals - 07/25/16 1422      CC Long Term Goal  #1   Title Pt will reduce circumference of right forearm at 10 cm proximal to ulnar styloid to 22 cm    Baseline 24.6cm   Time 4   Period Weeks   Status New     CC Long Term Goal  #2   Title Pt will reduce circumference of right forearm at 15 cm proximal to ulnar styloid to 26 cm    Baseline 28.5   Time 4   Period Weeks   Status New     CC Long Term Goal  #3   Title Pt will demonstrate 150 degrees of shoulder flexion to allow pt to reach items off a high shelf    Baseline 136   Time 4   Period Weeks   Status New     CC Long Term Goal  #4   Title Pt will demonstrate at least 50 degrees of right shoulder internal  rotation to all her to return to prior level of function   Baseline 30     CC Long Term Goal  #5   Title Pt will be independent in a home exercise program for continued strengthening and stretching    Time 4   Period Weeks   Status New            Plan - 07/27/16 6073    Clinical Impression Statement Pt attempted donning compression garments at home because she had to remove garments due to itching. She put her compression sleeve on under her bandages and stated that helped decrease the itching. Pt was educated that it was applying too much pressure on her arm. Re donned stockinette today and issued pt thin stockinette to try at home if thick stockinette still itches. Pt was instructed in self bandaging technique today and was able to demonstrate correct technique. Compression measurements taken today showed up to a 4 cm decrease in circumference throughout her RUE. Still awaiting arrival signed prescription for compression garments.    Clinical Impairments Affecting Rehab Potential  full axillary node dissection, previous chemo and radiation.    PT Frequency 3x / week   PT Duration 4 weeks   PT Treatment/Interventions Manual techniques;Therapeutic exercise;Passive range of motion;Patient/family  education;Compression bandaging;Vasopneumatic Device;ADLs/Self Care Home Management;DME Instruction;Therapeutic activities;Orthotic Fit/Training;Manual lymph drainage;Scar mobilization   PT Next Visit Plan  perform manual lymph draiange and  compression bandaging.  supine scapular exercise and joint mobilizaion to right shoulder to increase glenohoumeral  rotation    Consulted and  Agree with Plan of Care Patient      Patient will benefit from skilled therapeutic intervention in order to improve the following deficits and impairments:  Decreased range of motion, Pain, Impaired UE functional use, Increased fascial restricitons, Decreased strength, Decreased knowledge of use of DME, Decreased knowledge of precautions, Decreased scar mobility, Increased edema  Visit Diagnosis: Postmastectomy lymphedema     Problem List Patient Active Problem List   Diagnosis Date Noted  . Genetic testing 08/16/2015  . Iron deficiency anemia due to chronic blood loss 08/03/2015  . Family history of breast cancer   . Family history of colon cancer   . Family history of prostate cancer   . Breast cancer of lower-outer quadrant of right female breast ([place]) 07/29/2015  . Leiomyoma of uterus, unspecified 08/01/2012  . Irregular menstrual cycle 08/01/2012  . 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  . HTN (hypertension) 12/22/2010  . Anemia 12/22/2010  . Acid reflux 12/22/2010    Allyson Sabal [facility] 07/27/2016, 8:53 AM  [place], [place], [address] Phone: [phone]   Fax:  [phone]  Name: Melanie Moss MRN: [id number] Date of Birth: Apr 17, 1976  Manus Gunning, PT 07/27/16 8:54 AM

## 2016-07-30 ENCOUNTER — Ambulatory Visit: Payer: BLUE CROSS/BLUE SHIELD | Admitting: Physical Therapy

## 2016-07-30 ENCOUNTER — Encounter: Payer: Self-pay | Admitting: Physical Therapy

## 2016-07-30 DIAGNOSIS — M25611 Stiffness of right shoulder, not elsewhere classified: Secondary | ICD-10-CM | POA: Diagnosis not present

## 2016-07-30 DIAGNOSIS — I972 Postmastectomy lymphedema syndrome: Secondary | ICD-10-CM

## 2016-07-30 DIAGNOSIS — G8929 Other chronic pain: Secondary | ICD-10-CM

## 2016-07-30 DIAGNOSIS — M25511 Pain in right shoulder: Secondary | ICD-10-CM

## 2016-07-30 NOTE — Patient Instructions (Signed)
Wall Push    Stand at arm's length from wall, feet shoulder width apart. Inhale while gently leaning toward wall. Hold 1 count. Exhale while pushing back to starting position. Repeat __10__ times per set. Do __1__ sets per session. Do __1__ sessions per day.  Copyright  VHI. All rights reserved.  (Home) Extension: Isometric / Bilateral Arm Retraction - Sitting    Facing anchor, straps above elbows, neck and spine straight, pull elbows back. Repeat __10__ times per set. Do _1___ sets per session. Do _1___ sessions per week.   Copyright  VHI. All rights reserved.  Scapular Retraction (Standing)    With arms at sides, pinch shoulder blades together. Repeat __10__ times per set. Do __1__ sets per session. Do __1__ sessions per day.  http://orth.exer.us/945   Copyright  VHI. All rights reserved.

## 2016-07-30 NOTE — Therapy (Signed)
[place], [place], [address] Phone: [phone]   Fax:  [phone]  Physical Therapy Treatment  Patient Details  Name: Melanie Moss MRN: [id number] Date of Birth: Feb 22, 1976 Referring Provider: Dr. Excell Seltzer   Encounter Date: 07/30/2016      PT End of Session - 07/30/16 1344    Visit Number 3   Number of Visits 13   Date for PT Re-Evaluation 08/31/16   PT Start Time 2542   PT Stop Time 1345   PT Time Calculation (min) 47 min   Activity Tolerance Patient tolerated treatment well   Behavior During Therapy [facility] for tasks assessed/performed      Past Medical History:  Diagnosis Date  . Anemia   . Anxiety   . Breast cancer of lower-outer quadrant of right female breast ([place]) 07/29/2015  . Family history of breast cancer   . Family history of colon cancer   . Family history of prostate cancer   . GERD (gastroesophageal reflux disease)    tums   . Gestational diabetes   . Hyperlipidemia   . Hypertension    not currently on medication  . Preterm labor     Past Surgical History:  Procedure Laterality Date  . AXILLARY LYMPH NODE DISSECTION Right 09/29/2015   Procedure: AXILLARY LYMPH NODE DISSECTION;  Surgeon: Excell Seltzer, MD;  Location: [place];  Service: General;  Laterality: Right;  . BREAST RECONSTRUCTION WITH PLACEMENT OF TISSUE EXPANDER AND FLEX HD (ACELLULAR HYDRATED DERMIS) Right 09/29/2015   Procedure: BREAST RECONSTRUCTION WITH PLACEMENT OF TISSUE EXPANDER AND FLEX HD (ACELLULAR HYDRATED DERMIS);  Surgeon: Loel Lofty Dillingham, DO;  Location: [place];  Service: Plastics;  Laterality: Right;  . CERVICAL CERCLAGE  02/19/2011   Procedure: CERCLAGE CERVICAL;  Surgeon: Agnes Lawrence, MD;  Location: [facility];  Service: Gynecology;  Laterality: N/A;  . DILATION AND CURETTAGE OF UTERUS    . MASTECTOMY WITH AXILLARY LYMPH NODE DISSECTION Right 09/29/2015  . PORTACATH PLACEMENT N/A 09/29/2015   Procedure: INSERTION PORT-A-CATH;  Surgeon: Excell Seltzer, MD;  Location: [place];  Service: General;  Laterality: N/A;  . SIMPLE MASTECTOMY WITH AXILLARY SENTINEL NODE BIOPSY Right 09/29/2015   Procedure: RIGHT TOTAL  MASTECTOMY WITH AXILLARY SENTINEL NODE BIOPSY;  Surgeon: Excell Seltzer, MD;  Location: [place];  Service: General;  Laterality: Right;    There were no vitals filed for this visit.      Subjective Assessment - 07/30/16 1300    Subjective My arm is less itchy with the stockinette. I only unwrapped my arm when I took a shower. I had to unwrap my hand today for my Look good Feel Good class.   Pertinent History June 8th right side mastectomy and full axillary lymph node dissection, has had chemotherapy and radiation pt currently has an expander on R.  wear compression sleeve and gauntlet to try to control lymphedema   Patient Stated Goals to get swelling under control so she can get back to work    Currently in Pain? No/denies                         [facility] Adult PT Treatment/Exercise - 07/30/16 0001      Manual Therapy   Manual Therapy Manual Lymphatic Drainage (MLD);Compression Bandaging   Manual Lymphatic Drainage (MLD) [place] lymph drainage to right arm in supine: short neck, right inguinal nodes, left axillary nodes, right axillo-inguinal pathway and anterior inter-axillary pathway;  right arm redirecting along pathways.   Compression Bandaging biotone, then thick stockinette, elastomull on all fingers, 1 artiflex, 1-6 cm, 1-8 and 2-10 cm bandages from hand to axilla                 PT Education - 07/30/16 1340    Education provided Yes   Education Details Scapular strengthening   Person(s) Educated Patient   Methods Explanation;Demonstration;Handout   Comprehension Returned demonstration;Verbalized understanding                [facility] Goals - 07/25/16 1422      CC Long Term Goal  #1   Title Pt will reduce circumference of  right forearm at 10 cm proximal to ulnar styloid to 22 cm    Baseline 24.6cm   Time 4   Period Weeks   Status New     CC Long Term Goal  #2   Title Pt will reduce circumference of right forearm at 15 cm proximal to ulnar styloid to 26 cm    Baseline 28.5   Time 4   Period Weeks   Status New     CC Long Term Goal  #3   Title Pt will demonstrate 150 degrees of shoulder flexion to allow pt to reach items off a high shelf    Baseline 136   Time 4   Period Weeks   Status New     CC Long Term Goal  #4   Title Pt will demonstrate at least 50 degrees of right shoulder internal  rotation to all her to return to prior level of function   Baseline 30     CC Long Term Goal  #5   Title Pt will be independent in a home exercise program for continued strengthening and stretching    Time 4   Period Weeks   Status New            Plan - 07/30/16 1344    Clinical Impression Statement Patient reported less itching with new stockinette today. She tolerate manual lymph drainage without complaint of discomfort. No complaints with bandaging. She still has significant swelling present in right arm with reports of heaviness and discomfort with prolonged activity. She will benefit from continued PT 3x/week to furhter reduce swelling and progress scapular strengthening.   Rehab Potential Good   Clinical Impairments Affecting Rehab Potential  full axillary node dissection, previous chemo and radiation.    PT Frequency 3x / week   PT Duration 4 weeks   PT Treatment/Interventions Manual techniques;Therapeutic exercise;Passive range of motion;Patient/family education;Compression bandaging;Vasopneumatic Device;ADLs/Self Care Home Management;DME Instruction;Therapeutic activities;Orthotic Fit/Training;Manual lymph drainage;Scar mobilization   PT Next Visit Plan Continue manual lymph draiange and compression bandaging.  Review HEP given today and supine scapular exercise.   Consulted and Agree with Plan of  Care Patient      Patient will benefit from skilled therapeutic intervention in order to improve the following deficits and impairments:  Decreased range of motion, Pain, Impaired UE functional use, Increased fascial restricitons, Decreased strength, Decreased knowledge of use of DME, Decreased knowledge of precautions, Decreased scar mobility, Increased edema  Visit Diagnosis: Postmastectomy lymphedema  Stiffness of right shoulder, not elsewhere classified  Chronic right shoulder pain     Problem List Patient Active Problem List   Diagnosis Date Noted  . Genetic testing 08/16/2015  . Iron deficiency anemia due to chronic blood loss 08/03/2015  . Family history of breast cancer   . Family  history of colon cancer   . Family history of prostate cancer   . Breast cancer of lower-outer quadrant of right female breast ([place]) 07/29/2015  . Leiomyoma of uterus, unspecified 08/01/2012  . Irregular menstrual cycle 08/01/2012  . 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  . HTN (hypertension) 12/22/2010  . Anemia 12/22/2010  . Acid reflux 12/22/2010    Annia Friendly, PT 07/30/16 1:48 PM   [place], [place], [address] Phone: [phone]   Fax:  [phone]  Name: Melanie Moss MRN: [id number] Date of Birth: May 04, 1975

## 2016-08-01 ENCOUNTER — Ambulatory Visit: Payer: BLUE CROSS/BLUE SHIELD

## 2016-08-01 ENCOUNTER — Other Ambulatory Visit: Payer: Self-pay | Admitting: *Deleted

## 2016-08-01 DIAGNOSIS — M25511 Pain in right shoulder: Secondary | ICD-10-CM

## 2016-08-01 DIAGNOSIS — I972 Postmastectomy lymphedema syndrome: Secondary | ICD-10-CM

## 2016-08-01 DIAGNOSIS — M25611 Stiffness of right shoulder, not elsewhere classified: Secondary | ICD-10-CM | POA: Diagnosis not present

## 2016-08-01 DIAGNOSIS — G8929 Other chronic pain: Secondary | ICD-10-CM

## 2016-08-01 MED ORDER — AMLODIPINE BESYLATE 10 MG PO TABS: 10.0000 mg | ORAL_TABLET | Freq: Every day | ORAL | 3 refills | Status: DC

## 2016-08-01 MED FILL — AMLODIPINE BESYLATE 10 MG T: 10 | 30 days supply | Qty: 30 | Fill #3

## 2016-08-01 NOTE — Therapy (Signed)
[place], [place], [address] Phone: [phone]   Fax:  [phone]  Physical Therapy Treatment  Patient Details  Name: Melanie Moss MRN: [id number] Date of Birth: 03/25/1976 Referring Provider: Dr. Excell Seltzer   Encounter Date: 08/01/2016      PT End of Session - 08/01/16 1055    Visit Number 4   Number of Visits 13   Date for PT Re-Evaluation 08/31/16   PT Start Time 1021   PT Stop Time 1105   PT Time Calculation (min) 44 min   Activity Tolerance Patient tolerated treatment well   Behavior During Therapy [facility] for tasks assessed/performed      Past Medical History:  Diagnosis Date  . Anemia   . Anxiety   . Breast cancer of lower-outer quadrant of right female breast ([place]) 07/29/2015  . Family history of breast cancer   . Family history of colon cancer   . Family history of prostate cancer   . GERD (gastroesophageal reflux disease)    tums   . Gestational diabetes   . Hyperlipidemia   . Hypertension    not currently on medication  . Preterm labor     Past Surgical History:  Procedure Laterality Date  . AXILLARY LYMPH NODE DISSECTION Right 09/29/2015   Procedure: AXILLARY LYMPH NODE DISSECTION;  Surgeon: Excell Seltzer, MD;  Location: [place];  Service: General;  Laterality: Right;  . BREAST RECONSTRUCTION WITH PLACEMENT OF TISSUE EXPANDER AND FLEX HD (ACELLULAR HYDRATED DERMIS) Right 09/29/2015   Procedure: BREAST RECONSTRUCTION WITH PLACEMENT OF TISSUE EXPANDER AND FLEX HD (ACELLULAR HYDRATED DERMIS);  Surgeon: Loel Lofty Dillingham, DO;  Location: [place];  Service: Plastics;  Laterality: Right;  . CERVICAL CERCLAGE  02/19/2011   Procedure: CERCLAGE CERVICAL;  Surgeon: Agnes Lawrence, MD;  Location: [facility];  Service: Gynecology;  Laterality: N/A;  . DILATION AND CURETTAGE OF UTERUS    . MASTECTOMY WITH AXILLARY LYMPH NODE DISSECTION Right 09/29/2015  . PORTACATH PLACEMENT N/A 09/29/2015   Procedure: INSERTION PORT-A-CATH;  Surgeon: Excell Seltzer, MD;  Location: [place];  Service: General;  Laterality: N/A;  . SIMPLE MASTECTOMY WITH AXILLARY SENTINEL NODE BIOPSY Right 09/29/2015   Procedure: RIGHT TOTAL  MASTECTOMY WITH AXILLARY SENTINEL NODE BIOPSY;  Surgeon: Excell Seltzer, MD;  Location: [place];  Service: General;  Laterality: Right;    There were no vitals filed for this visit.      Subjective Assessment - 08/01/16 1026    Subjective Bandages were fine, no problems except the fingers tried to unravel.    Pertinent History June 8th right side mastectomy and full axillary lymph node dissection, has had chemotherapy and radiation pt currently has an expander on R.  wear compression sleeve and gauntlet to try to control lymphedema   Patient Stated Goals to get swelling under control so she can get back to work    Currently in Pain? No/denies               LYMPHEDEMA/ONCOLOGY QUESTIONNAIRE - 08/01/16 1027      Right Upper Extremity Lymphedema   15 cm Proximal to Olecranon Process 28.6 cm   10 cm Proximal to Olecranon Process 28.5 cm   Olecranon Process 26.1 cm   15 cm Proximal to Ulnar Styloid Process 25 cm   10 cm Proximal to Ulnar Styloid Process 22.8 cm   Just Proximal to Ulnar Styloid Process 16.4 cm   Across Hand at [employer] 18.7 cm  At [facility] of 2nd Digit 5.8 cm                  [facility] Adult PT Treatment/Exercise - 08/01/16 0001      Manual Therapy   Manual Therapy Manual Lymphatic Drainage (MLD);Compression Bandaging   Manual Lymphatic Drainage (MLD) Manual lymph drainage to right arm in supine: short neck, 5 diaphragmatic breaths, right inguinal nodes, left axillary nodes, right axillo-inguinal pathway and anterior inter-axillary pathway; right arm redirecting along pathways.   Compression Bandaging biotone, then thick stockinette, elastomull on all fingers, 1 artiflex, 1-6 cm, 1-8 (this one in herring bone fashion from wrist to elbow)  and 2-10 cm bandages from hand to axilla                         [place] - 07/25/16 1422      CC Long Term Goal  #1   Title Pt will reduce circumference of right forearm at 10 cm proximal to ulnar styloid to 22 cm    Baseline 24.6cm   Time 4   Period Weeks   Status New     CC Long Term Goal  #2   Title Pt will reduce circumference of right forearm at 15 cm proximal to ulnar styloid to 26 cm    Baseline 28.5   Time 4   Period Weeks   Status New     CC Long Term Goal  #3   Title Pt will demonstrate 150 degrees of shoulder flexion to allow pt to reach items off a high shelf    Baseline 136   Time 4   Period Weeks   Status New     CC Long Term Goal  #4   Title Pt will demonstrate at least 50 degrees of right shoulder internal  rotation to all her to return to prior level of function   Baseline 30     CC Long Term Goal  #5   Title Pt will be independent in a home exercise program for continued strengthening and stretching    Time 4   Period Weeks   Status New            Plan - 08/01/16 1106    Clinical Impression Statement Pt reported tolerating this bandage well and was able to leave it on until this appointment. Her circumference measurements had increased some since last time measured, especially at forearm, but not nearly as much as they were at her eval. So changed wrapping to increase compression at forearm to try to attain better reductions here. She reported bandage felt good and had no pain at end of session.     Rehab Potential Good   Clinical Impairments Affecting Rehab Potential  full axillary node dissection, previous chemo and radiation.    PT Frequency 3x / week   PT Duration 4 weeks   PT Treatment/Interventions Manual techniques;Therapeutic exercise;Passive range of motion;Patient/family education;Compression bandaging;Vasopneumatic Device;ADLs/Self Care Home Management;DME Instruction;Therapeutic activities;Orthotic  Fit/Training;Manual lymph drainage;Scar mobilization   PT Next Visit Plan Continue manual lymph draiange and compression bandaging.  Review HEP given today and supine scapular exercise.   Consulted and Agree with Plan of Care Patient      Patient will benefit from skilled therapeutic intervention in order to improve the following deficits and impairments:  Decreased range of motion, Pain, Impaired UE functional use, Increased fascial restricitons, Decreased strength, Decreased knowledge of use of DME, Decreased knowledge of  precautions, Decreased scar mobility, Increased edema  Visit Diagnosis: Postmastectomy lymphedema  Chronic right shoulder pain  Stiffness of right shoulder, not elsewhere classified     Problem List Patient Active Problem List   Diagnosis Date Noted  . Genetic testing 08/16/2015  . Iron deficiency anemia due to chronic blood loss 08/03/2015  . Family history of breast cancer   . Family history of colon cancer   . Family history of prostate cancer   . Breast cancer of lower-outer quadrant of right female breast ([place]) 07/29/2015  . Leiomyoma of uterus, unspecified 08/01/2012  . Irregular menstrual cycle 08/01/2012  . 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  . HTN (hypertension) 12/22/2010  . Anemia 12/22/2010  . Acid reflux 12/22/2010    Otelia Limes, PTA 08/01/2016, 11:09 AM  [place], [place], [address] Phone: [phone]   Fax:  [phone]  Name: BEVERLIE KURIHARA MRN: [id number] Date of Birth: [birth year]

## 2016-08-03 ENCOUNTER — Telehealth: Payer: Self-pay | Admitting: Physical Therapy

## 2016-08-03 NOTE — Telephone Encounter (Signed)
Called Nicosha to let her know the prescription for the circaid reduction kit, sleeve and glove is received.  Informed her of where to call to get an appointment to have reduction kit fitted and that she could wear it as a bandaging alternataive and at night.  She should only get measured for sleeve and glove once she is fully reduced.  Pt understands and will pick up the prescription at her appointment on Monday  Melanie Moss, PT

## 2016-08-06 ENCOUNTER — Ambulatory Visit: Payer: BLUE CROSS/BLUE SHIELD

## 2016-08-06 DIAGNOSIS — I972 Postmastectomy lymphedema syndrome: Secondary | ICD-10-CM

## 2016-08-06 DIAGNOSIS — M25611 Stiffness of right shoulder, not elsewhere classified: Secondary | ICD-10-CM | POA: Diagnosis not present

## 2016-08-06 NOTE — Therapy (Signed)
[place], [place], [address] Phone: [phone]   Fax:  [phone]  Physical Therapy Treatment  Patient Details  Name: Melanie Moss MRN: [id number] Date of Birth: 10-07-1975 Referring Provider: Dr. Excell Seltzer   Encounter Date: 08/06/2016      PT End of Session - 08/06/16 1155    Visit Number 5   Number of Visits 13   Date for PT Re-Evaluation 08/31/16   PT Start Time 1104   PT Stop Time 1154   PT Time Calculation (min) 50 min   Activity Tolerance Patient tolerated treatment well   Behavior During Therapy [facility] for tasks assessed/performed      Past Medical History:  Diagnosis Date  . Anemia   . Anxiety   . Breast cancer of lower-outer quadrant of right female breast ([place]) 07/29/2015  . Family history of breast cancer   . Family history of colon cancer   . Family history of prostate cancer   . GERD (gastroesophageal reflux disease)    tums   . Gestational diabetes   . Hyperlipidemia   . Hypertension    not currently on medication  . Preterm labor     Past Surgical History:  Procedure Laterality Date  . AXILLARY LYMPH NODE DISSECTION Right 09/29/2015   Procedure: AXILLARY LYMPH NODE DISSECTION;  Surgeon: Excell Seltzer, MD;  Location: [place];  Service: General;  Laterality: Right;  . BREAST RECONSTRUCTION WITH PLACEMENT OF TISSUE EXPANDER AND FLEX HD (ACELLULAR HYDRATED DERMIS) Right 09/29/2015   Procedure: BREAST RECONSTRUCTION WITH PLACEMENT OF TISSUE EXPANDER AND FLEX HD (ACELLULAR HYDRATED DERMIS);  Surgeon: Loel Lofty Dillingham, DO;  Location: [place];  Service: Plastics;  Laterality: Right;  . CERVICAL CERCLAGE  02/19/2011   Procedure: CERCLAGE CERVICAL;  Surgeon: Agnes Lawrence, MD;  Location: [facility];  Service: Gynecology;  Laterality: N/A;  . DILATION AND CURETTAGE OF UTERUS    . MASTECTOMY WITH AXILLARY LYMPH NODE DISSECTION Right 09/29/2015  . PORTACATH PLACEMENT N/A 09/29/2015   Procedure: INSERTION PORT-A-CATH;  Surgeon: Excell Seltzer, MD;  Location: [place];  Service: General;  Laterality: N/A;  . SIMPLE MASTECTOMY WITH AXILLARY SENTINEL NODE BIOPSY Right 09/29/2015   Procedure: RIGHT TOTAL  MASTECTOMY WITH AXILLARY SENTINEL NODE BIOPSY;  Surgeon: Excell Seltzer, MD;  Location: [place];  Service: General;  Laterality: Right;    There were no vitals filed for this visit.      Subjective Assessment - 08/06/16 1114    Subjective I rewrapped my arm twice this weekend. My hand/arm were hurting some this weekend and now it's more swollen so I'm not sure what the problem is. I'm waiting to hear back about an appt to get measured for a reduction kit.    Pertinent History June 8th right side mastectomy and full axillary lymph node dissection, has had chemotherapy and radiation pt currently has an expander on R.  wear compression sleeve and gauntlet to try to control lymphedema   Patient Stated Goals to get swelling under control so she can get back to work    Currently in Pain? No/denies                         [facility] Adult PT Treatment/Exercise - 08/06/16 0001      Manual Therapy   Manual Therapy Manual Lymphatic Drainage (MLD);Compression Bandaging   Manual Lymphatic Drainage (MLD) Manual lymph drainage to right arm in supine: short neck, 5 diaphragmatic  breaths, right inguinal nodes, left axillary nodes, right axillo-inguinal pathway and anterior inter-axillary pathway; right arm redirecting along pathways.   Compression Bandaging biotone, then thick stockinette, elastomull on fingers 2-5 with 1/2" gray foam at dorsal hand and wrist, 1 artiflex and peach medi foam with small dots to lateral upper forearm , 1-6 cm, 1-8 (this one in herring bone fashion from wrist to elbow) and 2-10 cm bandages from hand to axilla                         [place] - 07/25/16 1422      CC Long Term Goal  #1   Title Pt will reduce  circumference of right forearm at 10 cm proximal to ulnar styloid to 22 cm    Baseline 24.6cm   Time 4   Period Weeks   Status New     CC Long Term Goal  #2   Title Pt will reduce circumference of right forearm at 15 cm proximal to ulnar styloid to 26 cm    Baseline 28.5   Time 4   Period Weeks   Status New     CC Long Term Goal  #3   Title Pt will demonstrate 150 degrees of shoulder flexion to allow pt to reach items off a high shelf    Baseline 136   Time 4   Period Weeks   Status New     CC Long Term Goal  #4   Title Pt will demonstrate at least 50 degrees of right shoulder internal  rotation to all her to return to prior level of function   Baseline 30     CC Long Term Goal  #5   Title Pt will be independent in a home exercise program for continued strengthening and stretching    Time 4   Period Weeks   Status New            Plan - 08/06/16 1155    Clinical Impression Statement Pt continues to tolerate bandaging very well and has been rewrapping at home as bandages come loose not leaving them off for more than 2 hours at a time and sleeping in them as well. Pt did have some visible increase of fluid at her dorsal hand and forearm today (which did soften by end of manual lymph drainage) so added compression foam to bandaging to help decrease this and suggested pt to rewrap 2x/day or when she starts noticing bandages loosening on arm. Pt was given copy of script today for compression garments so she plans to make an appt ASAP for a Circaid reduction kit.    Rehab Potential Good   Clinical Impairments Affecting Rehab Potential  full axillary node dissection, previous chemo and radiation.    PT Frequency 3x / week   PT Duration 4 weeks   PT Treatment/Interventions Manual techniques;Therapeutic exercise;Passive range of motion;Patient/family education;Compression bandaging;Vasopneumatic Device;ADLs/Self Care Home Management;DME Instruction;Therapeutic activities;Orthotic  Fit/Training;Manual lymph drainage;Scar mobilization   PT Next Visit Plan Remeasure circumference. Continue manual lymph drainage and compression bandaging.  Review HEP given today and supine scapular exercise.   Consulted and Agree with Plan of Care Patient      Patient will benefit from skilled therapeutic intervention in order to improve the following deficits and impairments:  Decreased range of motion, Pain, Impaired UE functional use, Increased fascial restricitons, Decreased strength, Decreased knowledge of use of DME, Decreased knowledge of precautions, Decreased scar mobility,  Increased edema  Visit Diagnosis: Postmastectomy lymphedema     Problem List Patient Active Problem List   Diagnosis Date Noted  . Genetic testing 08/16/2015  . Iron deficiency anemia due to chronic blood loss 08/03/2015  . Family history of breast cancer   . Family history of colon cancer   . Family history of prostate cancer   . Breast cancer of lower-outer quadrant of right female breast ([place]) 07/29/2015  . Leiomyoma of uterus, unspecified 08/01/2012  . Irregular menstrual cycle 08/01/2012  . 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  . HTN (hypertension) 12/22/2010  . Anemia 12/22/2010  . Acid reflux 12/22/2010    Otelia Limes, PTA 08/06/2016, 12:00 PM  [place], [place], [address] Phone: [phone]   Fax:  [phone]  Name: Melanie Moss MRN: [id number] Date of Birth: October 02, 1975

## 2016-08-07 ENCOUNTER — Telehealth: Payer: Self-pay | Admitting: Medical Oncology

## 2016-08-07 NOTE — Telephone Encounter (Signed)
She needs a new return to work date of may 13th . Dr Excell Seltzer sent her to PT who recommended PT till may 10. Dr Burr Medico released her to work April 16th. . I told her to bring in paperwork and give to Dr Lewayne Bunting nurse on Thursday when she comes in for tx.

## 2016-08-07 NOTE — Telephone Encounter (Signed)
Called pt and left message on voice mail re:  Asked pt to also contact Dr. Lear Ng office for a letter releasing pt back to work on May 10 - since surgeon recommended PT until that date.

## 2016-08-08 ENCOUNTER — Ambulatory Visit: Payer: BLUE CROSS/BLUE SHIELD | Admitting: Physical Therapy

## 2016-08-08 DIAGNOSIS — M25611 Stiffness of right shoulder, not elsewhere classified: Secondary | ICD-10-CM

## 2016-08-08 DIAGNOSIS — G8929 Other chronic pain: Secondary | ICD-10-CM

## 2016-08-08 DIAGNOSIS — M25511 Pain in right shoulder: Secondary | ICD-10-CM

## 2016-08-08 DIAGNOSIS — I972 Postmastectomy lymphedema syndrome: Secondary | ICD-10-CM

## 2016-08-08 NOTE — Therapy (Signed)
[place], [place], [address] Phone: [phone]   Fax:  [phone]  Physical Therapy Treatment  Patient Details  Name: Melanie Moss MRN: [id number] Date of Birth: May 03, 1975 Referring Provider: Dr. Excell Seltzer   Encounter Date: 08/08/2016      PT End of Session - 08/08/16 1408    Visit Number 6   Number of Visits 13   Date for PT Re-Evaluation 08/31/16   PT Start Time 1105   PT Stop Time 1155   PT Time Calculation (min) 50 min   Activity Tolerance Patient tolerated treatment well   Behavior During Therapy [facility] for tasks assessed/performed      Past Medical History:  Diagnosis Date  . Anemia   . Anxiety   . Breast cancer of lower-outer quadrant of right female breast ([place]) 07/29/2015  . Family history of breast cancer   . Family history of colon cancer   . Family history of prostate cancer   . GERD (gastroesophageal reflux disease)    tums   . Gestational diabetes   . Hyperlipidemia   . Hypertension    not currently on medication  . Preterm labor     Past Surgical History:  Procedure Laterality Date  . AXILLARY LYMPH NODE DISSECTION Right 09/29/2015   Procedure: AXILLARY LYMPH NODE DISSECTION;  Surgeon: Excell Seltzer, MD;  Location: [place];  Service: General;  Laterality: Right;  . BREAST RECONSTRUCTION WITH PLACEMENT OF TISSUE EXPANDER AND FLEX HD (ACELLULAR HYDRATED DERMIS) Right 09/29/2015   Procedure: BREAST RECONSTRUCTION WITH PLACEMENT OF TISSUE EXPANDER AND FLEX HD (ACELLULAR HYDRATED DERMIS);  Surgeon: Loel Lofty Dillingham, DO;  Location: [place];  Service: Plastics;  Laterality: Right;  . CERVICAL CERCLAGE  02/19/2011   Procedure: CERCLAGE CERVICAL;  Surgeon: Agnes Lawrence, MD;  Location: [facility];  Service: Gynecology;  Laterality: N/A;  . DILATION AND CURETTAGE OF UTERUS    . MASTECTOMY WITH AXILLARY LYMPH NODE DISSECTION Right 09/29/2015  . PORTACATH PLACEMENT N/A 09/29/2015   Procedure: INSERTION PORT-A-CATH;  Surgeon: Excell Seltzer, MD;  Location: [place];  Service: General;  Laterality: N/A;  . SIMPLE MASTECTOMY WITH AXILLARY SENTINEL NODE BIOPSY Right 09/29/2015   Procedure: RIGHT TOTAL  MASTECTOMY WITH AXILLARY SENTINEL NODE BIOPSY;  Surgeon: Excell Seltzer, MD;  Location: [place];  Service: General;  Laterality: Right;    There were no vitals filed for this visit.      Subjective Assessment - 08/08/16 1401    Subjective Pt stated  her circaid reduction kit is ordered and should arrive in a day or 2. She took off her bandages and rewrapped her arm. She still is having discomfort and decreased range of motion in her left shoulder.    Pertinent History June 8th right side mastectomy and full axillary lymph node dissection, has had chemotherapy and radiation pt currently has an expander on R.  wear compression sleeve and gauntlet to try to control lymphedema   Limitations Sitting   Patient Stated Goals to get swelling under control so she can get back to work    Currently in Pain? No/denies  except she does have pain with stretching left shoulder                LYMPHEDEMA/ONCOLOGY QUESTIONNAIRE - 08/08/16 1102      Right Upper Extremity Lymphedema   15 cm Proximal to Olecranon Process 30.5 cm   10 cm Proximal to Olecranon Process 31 cm   Olecranon Process  26.5 cm   15 cm Proximal to Ulnar Styloid Process 27.5 cm   10 cm Proximal to Ulnar Styloid Process 24.2 cm   Just Proximal to Ulnar Styloid Process 17.2 cm   Across Hand at [employer] 18.7 cm   At [place] of 2nd Digit 5.8 cm                  [facility] Adult PT Treatment/Exercise - 08/08/16 0001      Shoulder Exercises: Sidelying   ABduction AROM;Right   ABduction Limitations deep breath at the top    Other Sidelying Exercises small controlled circles in each direction.     Shoulder Exercises: Stretch   Table Stretch - Flexion 2 reps;10 seconds   Table Stretch -Flexion  Limitations posterior pelvic tilt and palm up to stretch latts      Manual Therapy   Manual Therapy Manual Lymphatic Drainage (MLD);Compression Bandaging;Passive ROM   Manual Lymphatic Drainage (MLD) Manual lymph drainage to right arm in supine: short neck, 5 diaphragmatic breaths, right inguinal nodes, left axillary nodes, right axillo-inguinal pathway and anterior inter-axillary pathway; right arm redirecting along pathways.   Compression Bandaging biotone, then thick stockinette, elastomull on fingers 2-5 with 1/2" gray foam at dorsal hand and wrist, 1 artiflex and peach medi foam with small dots to lateral upper forearm , 1-6 cm, 1-8 (this one in herring bone fashion from wrist to elbow) and 2-10 cm bandages from hand to axilla    Passive ROM PROM to left shoulder into flexion and abcution within pain limites                         Long Term Clinic Goals - 07/25/16 1422      CC Long Term Goal  #1   Title Pt will reduce circumference of right forearm at 10 cm proximal to ulnar styloid to 22 cm    Baseline 24.6cm   Time 4   Period Weeks   Status New     CC Long Term Goal  #2   Title Pt will reduce circumference of right forearm at 15 cm proximal to ulnar styloid to 26 cm    Baseline 28.5   Time 4   Period Weeks   Status New     CC Long Term Goal  #3   Title Pt will demonstrate 150 degrees of shoulder flexion to allow pt to reach items off a high shelf    Baseline 136   Time 4   Period Weeks   Status New     CC Long Term Goal  #4   Title Pt will demonstrate at least 50 degrees of right shoulder internal  rotation to all her to return to prior level of function   Baseline 30     CC Long Term Goal  #5   Title Pt will be independent in a home exercise program for continued strengthening and stretching    Time 4   Period Weeks   Status New            Plan - 08/08/16 1408    Clinical Impression Statement Pt contines to have lymphedema in her left arm,  especially forearm, that is responds to compression but is fluctuating.  she has not yet received her Flexitouch so email sent to [employer] today.  Pt continues to have tightness  and pain in left shoulder also    Rehab Potential Good   Clinical Impairments  Affecting Rehab Potential  full axillary node dissection, previous chemo and radiation.    PT Frequency 3x / week   PT Duration 4 weeks   PT Treatment/Interventions Manual techniques;Therapeutic exercise;Passive range of motion;Patient/family education;Compression bandaging;Vasopneumatic Device;ADLs/Self Care Home Management;DME Instruction;Therapeutic activities;Orthotic Fit/Training;Manual lymph drainage;Scar mobilization   PT Next Visit Plan add pulleys/wall stretches and focus on exercise once reduction kit arrives.  continue with CDT    Consulted and Agree with Plan of Care Patient      Patient will benefit from skilled therapeutic intervention in order to improve the following deficits and impairments:  Decreased range of motion, Pain, Impaired UE functional use, Increased fascial restricitons, Decreased strength, Decreased knowledge of use of DME, Decreased knowledge of precautions, Decreased scar mobility, Increased edema  Visit Diagnosis: Postmastectomy lymphedema  Chronic right shoulder pain  Stiffness of right shoulder, not elsewhere classified     Problem List Patient Active Problem List   Diagnosis Date Noted  . Genetic testing 08/16/2015  . Iron deficiency anemia due to chronic blood loss 08/03/2015  . Family history of breast cancer   . Family history of colon cancer   . Family history of prostate cancer   . Breast cancer of lower-outer quadrant of right female breast ([place]) 07/29/2015  . Leiomyoma of uterus, unspecified 08/01/2012  . Irregular menstrual cycle 08/01/2012  . 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  . HTN (hypertension) 12/22/2010  . Anemia 12/22/2010  . Acid reflux 12/22/2010   Donato Heinz. Owens Shark PT  Norwood Levo 08/08/2016, 2:14 PM  [place], [place], [address] Phone: [phone]   Fax:  [phone]  Name: Melanie Moss MRN: [id number] Date of Birth: Oct 15, 1975

## 2016-08-09 ENCOUNTER — Encounter: Payer: Self-pay | Admitting: Hematology

## 2016-08-09 ENCOUNTER — Ambulatory Visit: Payer: BLUE CROSS/BLUE SHIELD

## 2016-08-09 ENCOUNTER — Ambulatory Visit (HOSPITAL_BASED_OUTPATIENT_CLINIC_OR_DEPARTMENT_OTHER): Payer: BLUE CROSS/BLUE SHIELD

## 2016-08-09 ENCOUNTER — Other Ambulatory Visit (HOSPITAL_BASED_OUTPATIENT_CLINIC_OR_DEPARTMENT_OTHER): Payer: BLUE CROSS/BLUE SHIELD

## 2016-08-09 ENCOUNTER — Encounter: Payer: Self-pay | Admitting: Pharmacist

## 2016-08-09 VITALS — BP 135/87 | HR 89 | Temp 98.1°F | Resp 18

## 2016-08-09 DIAGNOSIS — Z170 Estrogen receptor positive status [ER+]: Secondary | ICD-10-CM

## 2016-08-09 DIAGNOSIS — C50511 Malignant neoplasm of lower-outer quadrant of right female breast: Secondary | ICD-10-CM | POA: Diagnosis not present

## 2016-08-09 DIAGNOSIS — D500 Iron deficiency anemia secondary to blood loss (chronic): Secondary | ICD-10-CM

## 2016-08-09 DIAGNOSIS — Z5112 Encounter for antineoplastic immunotherapy: Secondary | ICD-10-CM

## 2016-08-09 LAB — CBC WITH DIFFERENTIAL/PLATELET
BASO%: 0.4 % (ref 0.0–2.0)
Basophils Absolute: 0 10*3/uL (ref 0.0–0.1)
EOS%: 1.6 % (ref 0.0–7.0)
Eosinophils Absolute: 0.1 10*3/uL (ref 0.0–0.5)
HCT: 31.6 % — ABNORMAL LOW (ref 34.8–46.6)
HGB: 10.8 g/dL — ABNORMAL LOW (ref 11.6–15.9)
LYMPH%: 35.7 % (ref 14.0–49.7)
MCH: 31.3 pg (ref 25.1–34.0)
MCHC: 34.2 g/dL (ref 31.5–36.0)
MCV: 91.7 fL (ref 79.5–101.0)
MONO#: 0.5 10*3/uL (ref 0.1–0.9)
MONO%: 6.7 % (ref 0.0–14.0)
NEUT#: 4 10*3/uL (ref 1.5–6.5)
NEUT%: 55.6 % (ref 38.4–76.8)
Platelets: 240 10*3/uL (ref 145–400)
RBC: 3.45 10*6/uL — ABNORMAL LOW (ref 3.70–5.45)
RDW: 12.7 % (ref 11.2–14.5)
WBC: 7.2 10*3/uL (ref 3.9–10.3)
lymph#: 2.6 10*3/uL (ref 0.9–3.3)

## 2016-08-09 LAB — COMPREHENSIVE METABOLIC PANEL
ALT: 15 U/L (ref 0–55)
AST: 21 U/L (ref 5–34)
Albumin: 3.8 g/dL (ref 3.5–5.0)
Alkaline Phosphatase: 51 U/L (ref 40–150)
Anion Gap: 9 mEq/L (ref 3–11)
BUN: 13.8 mg/dL (ref 7.0–26.0)
CO2: 25 mEq/L (ref 22–29)
Calcium: 9.4 mg/dL (ref 8.4–10.4)
Chloride: 107 mEq/L (ref 98–109)
Creatinine: 0.8 mg/dL (ref 0.6–1.1)
EGFR: >90 mL/min/{1.73_m2} (ref >90)
Glucose: 100 mg/dl (ref 70–140)
Potassium: 3.6 mEq/L (ref 3.5–5.1)
Sodium: 141 mEq/L (ref 136–145)
Total Bilirubin: 0.25 mg/dL (ref 0.20–1.20)
Total Protein: 7.4 g/dL (ref 6.4–8.3)

## 2016-08-09 LAB — IRON AND TIBC
%SAT: 28 % (ref 21–57)
Iron: 62 ug/dL (ref 41–142)
TIBC: 223 ug/dL — ABNORMAL LOW (ref 236–444)
UIBC: 161 ug/dL (ref 120–384)

## 2016-08-09 LAB — FERRITIN: Ferritin: 221 ng/ml (ref 9–269)

## 2016-08-09 MED ORDER — SODIUM CHLORIDE 0.9 % IV SOLN
Freq: Once | INTRAVENOUS | Status: AC
  Administered 2016-08-09: 11:00:00 via INTRAVENOUS

## 2016-08-09 MED ORDER — HEPARIN SOD (PORK) LOCK FLUSH 100 UNIT/ML IV SOLN
500.0000 [IU] | Freq: Once | INTRAVENOUS | Status: DC | PRN
  Filled 2016-08-09: qty 5

## 2016-08-09 MED ORDER — SODIUM CHLORIDE 0.9% FLUSH
10.0000 mL | INTRAVENOUS | Status: DC | PRN
  Administered 2016-08-09: 10 mL
  Filled 2016-08-09: qty 10

## 2016-08-09 MED ORDER — SODIUM CHLORIDE 0.9 % IV SOLN
420.0000 mg | Freq: Once | INTRAVENOUS | Status: AC
  Administered 2016-08-09: 420 mg via INTRAVENOUS
  Filled 2016-08-09: qty 14

## 2016-08-09 MED ORDER — SODIUM CHLORIDE 0.9 % IJ SOLN
10.0000 mL | INTRAMUSCULAR | Status: DC | PRN
  Administered 2016-08-09: 10 mL via INTRAVENOUS
  Filled 2016-08-09: qty 10

## 2016-08-09 MED ORDER — TRASTUZUMAB CHEMO 150 MG IV SOLR
6.0000 mg/kg | Freq: Once | INTRAVENOUS | Status: AC
  Administered 2016-08-09: 420 mg via INTRAVENOUS
  Filled 2016-08-09: qty 20

## 2016-08-09 MED ORDER — ACETAMINOPHEN 325 MG PO TABS
ORAL_TABLET | ORAL | Status: AC
  Filled 2016-08-09: qty 2

## 2016-08-09 MED ORDER — ACETAMINOPHEN 325 MG PO TABS
650.0000 mg | ORAL_TABLET | Freq: Once | ORAL | Status: AC
  Administered 2016-08-09: 650 mg via ORAL

## 2016-08-09 MED ORDER — DIPHENHYDRAMINE HCL 25 MG PO CAPS
50.0000 mg | ORAL_CAPSULE | Freq: Once | ORAL | Status: AC
  Administered 2016-08-09: 25 mg via ORAL

## 2016-08-09 MED ORDER — DIPHENHYDRAMINE HCL 25 MG PO CAPS
ORAL_CAPSULE | ORAL | Status: AC
  Filled 2016-08-09: qty 1

## 2016-08-09 MED ORDER — HEPARIN SOD (PORK) LOCK FLUSH 100 UNIT/ML IV SOLN
500.0000 [IU] | Freq: Once | INTRAVENOUS | Status: AC | PRN
  Administered 2016-08-09: 500 [IU]
  Filled 2016-08-09: qty 5

## 2016-08-09 NOTE — Patient Instructions (Signed)
[facility] Discharge Instructions for Patients Receiving Chemotherapy  Today you received the following chemotherapy agents Herceptin and Perjeta   To help prevent nausea and vomiting after your treatment, we encourage you to take your nausea medication as directed.    If you develop nausea and vomiting that is not controlled by your nausea medication, call the clinic.   BELOW ARE SYMPTOMS THAT SHOULD BE REPORTED IMMEDIATELY:  *FEVER GREATER THAN 100.5 F  *CHILLS WITH OR WITHOUT FEVER  NAUSEA AND VOMITING THAT IS NOT CONTROLLED WITH YOUR NAUSEA MEDICATION  *UNUSUAL SHORTNESS OF BREATH  *UNUSUAL BRUISING OR BLEEDING  TENDERNESS IN MOUTH AND THROAT WITH OR WITHOUT PRESENCE OF ULCERS  *URINARY PROBLEMS  *BOWEL PROBLEMS  UNUSUAL RASH Items with * indicate a potential emergency and should be followed up as soon as possible.  Feel free to call the clinic you have any questions or concerns. The clinic phone number is [phone].  Please show the CHEMO ALERT CARD at check-in to the Emergency Department and triage nurse.   

## 2016-08-09 NOTE — Progress Notes (Signed)
Consent documentation  Study code: [id number]  Met with patient during her infusion appointment on 08/09/16 at 10:30. Patient was alone for this visit. Provided an overview of the "Pharmacogenetic analysis of toxicities related to administration of taxanes in breast cancer patients" study.  Consent form was reviewed with the patient (reviewed the study purpose, patient's role, possible side effects, cost, risk, information protection) All of the patient's questions were answered and she agreed to participate in the study. A signed copy of the consent form was given to the patient. All eligibility criteria have been met and patient has been enrolled in the study.  Study sample was collected via buccal swab after consent was obtained. Patient was informed that the sample would be sent to the lab for processing after samples were collected from 25 patients and that it would take approximately 2 weeks for the lab to process that sample.   Met with the patient for approximately 15 minutes.  Darl Pikes, PharmD, [place] Clinical Pharmacist- Oncology Pharmacy Resident

## 2016-08-09 NOTE — Patient Instructions (Signed)

## 2016-08-10 ENCOUNTER — Ambulatory Visit: Payer: BLUE CROSS/BLUE SHIELD | Admitting: Physical Therapy

## 2016-08-10 DIAGNOSIS — I972 Postmastectomy lymphedema syndrome: Secondary | ICD-10-CM

## 2016-08-10 DIAGNOSIS — M25511 Pain in right shoulder: Secondary | ICD-10-CM

## 2016-08-10 DIAGNOSIS — M25611 Stiffness of right shoulder, not elsewhere classified: Secondary | ICD-10-CM

## 2016-08-10 DIAGNOSIS — G8929 Other chronic pain: Secondary | ICD-10-CM

## 2016-08-10 NOTE — Therapy (Signed)
[place], [place], [address] Phone: [phone]   Fax:  [phone]  Physical Therapy Treatment  Patient Details  Name: Melanie Moss MRN: [id number] Date of Birth: [birth year] Referring Provider: Dr. Excell Seltzer   Encounter Date: 08/10/2016      PT End of Session - 08/10/16 1157    Visit Number 7   Number of Visits 13   Date for PT Re-Evaluation 08/31/16   PT Start Time 1100   PT Stop Time 1145   PT Time Calculation (min) 45 min   Activity Tolerance Patient tolerated treatment well   Behavior During Therapy [facility] for tasks assessed/performed      Past Medical History:  Diagnosis Date  . Anemia   . Anxiety   . Breast cancer of lower-outer quadrant of right female breast ([place]) 07/29/2015  . Family history of breast cancer   . Family history of colon cancer   . Family history of prostate cancer   . GERD (gastroesophageal reflux disease)    tums   . Gestational diabetes   . Hyperlipidemia   . Hypertension    not currently on medication  . Preterm labor     Past Surgical History:  Procedure Laterality Date  . AXILLARY LYMPH NODE DISSECTION Right 09/29/2015   Procedure: AXILLARY LYMPH NODE DISSECTION;  Surgeon: Excell Seltzer, MD;  Location: [place] [facility];  Service: General;  Laterality: Right;  . BREAST RECONSTRUCTION WITH PLACEMENT OF TISSUE EXPANDER AND FLEX HD (ACELLULAR HYDRATED DERMIS) Right 09/29/2015   Procedure: BREAST RECONSTRUCTION WITH PLACEMENT OF TISSUE EXPANDER AND FLEX HD (ACELLULAR HYDRATED DERMIS);  Surgeon: Loel Lofty Dillingham, DO;  Location: [facility];  Service: Plastics;  Laterality: Right;  . CERVICAL CERCLAGE  02/19/2011   Procedure: CERCLAGE CERVICAL;  Surgeon: Agnes Lawrence, MD;  Location: [facility];  Service: Gynecology;  Laterality: N/A;  . DILATION AND CURETTAGE OF UTERUS    . MASTECTOMY WITH AXILLARY LYMPH NODE DISSECTION Right 09/29/2015  . PORTACATH PLACEMENT N/A 09/29/2015    Procedure: INSERTION PORT-A-CATH;  Surgeon: Excell Seltzer, MD;  Location: [facility];  Service: General;  Laterality: N/A;  . SIMPLE MASTECTOMY WITH AXILLARY SENTINEL NODE BIOPSY Right 09/29/2015   Procedure: RIGHT TOTAL  MASTECTOMY WITH AXILLARY SENTINEL NODE BIOPSY;  Surgeon: Excell Seltzer, MD;  Location: [facility];  Service: General;  Laterality: Right;    There were no vitals filed for this visit.      Subjective Assessment - 08/10/16 1109    Subjective Pt will go and get the reduction kit after this appointment    Pertinent History June 8th right side mastectomy and full axillary lymph node dissection, has had chemotherapy and radiation pt currently has an expander on R.  wear compression sleeve and gauntlet to try to control lymphedema   Patient Stated Goals to get swelling under control so she can get back to work    Currently in Pain? No/denies               LYMPHEDEMA/ONCOLOGY QUESTIONNAIRE - 08/10/16 1110      Right Upper Extremity Lymphedema   15 cm Proximal to Olecranon Process 30 cm   10 cm Proximal to Olecranon Process 30 cm   Olecranon Process 26.5 cm   15 cm Proximal to Ulnar Styloid Process 26.3 cm   10 cm Proximal to Ulnar Styloid Process 24 cm   Just Proximal to Ulnar Styloid Process 16.5 cm   Across Hand at [employer] 18.9  cm   At [facility] of 2nd Digit 5.7 cm                  [facility] Adult PT Treatment/Exercise - 08/10/16 0001      Self-Care   Self-Care Other Self-Care Comments   Other Self-Care Comments  Pt given information about the [employer] Program and yoga exercise offerred through the Cancer Center      Manual Therapy   Manual Lymphatic Drainage (MLD) Spend several minutes on diaphragmatic breathing and importance for pt to do it several times a day.  She will need reinforcement for this.  then did short neck right side, superficial and deep abdominals. to sidelying for right shoulder and back then to supine for right upper arm to hand  with return along pathways.    Passive ROM PROM to left shoulder into flexion and abcution within pain limites                 PT Education - 08/10/16 1206    Education provided Yes   Education Details [employer] program and cancer center exercise programs    Person(s) Educated Patient   Methods Explanation;Handout   Comprehension Verbalized understanding                Long Term Clinic Goals - 08/10/16 1208      CC Long Term Goal  #1   Title Pt will reduce circumference of right forearm at 10 cm proximal to ulnar styloid to 22 cm    Baseline 24.6cm, 24 cm on 08/10/2016   Time 4   Period Weeks   Status On-going     CC Long Term Goal  #2   Title Pt will reduce circumference of right forearm at 15 cm proximal to ulnar styloid to 26 cm    Baseline 28.5 on eval, 26.3 on 08/10/2016   Time 4   Period Weeks   Status On-going     CC Long Term Goal  #3   Title Pt will demonstrate 150 degrees of shoulder flexion to allow pt to reach items off a high shelf    Baseline 136   Period Weeks   Status On-going     CC Long Term Goal  #4   Title Pt will demonstrate at least 50 degrees of right shoulder internal  rotation to all her to return to prior level of function   Baseline 30   Time 8   Status On-going     CC Long Term Goal  #5   Title Pt will be independent in a home exercise program for continued strengthening and stretching    Time 4   Period Weeks   Status On-going            Plan - 08/10/16 1157    Clinical Impression Statement Remeasured pt and she has had reduction since last session with good use of the small dot lymphpads at forearm to soften tissue.  She is ready for her reduction kit, but recommend that she wear that for several days to see if she has further reduction and stablalization before she is measured for her sleeve and glove.  She is scheduled to have Flexitouch demo next Thursday. Gave pt information about [employer] and yoga exercises  as she would benefit from continued exercises.    Rehab Potential Good   Clinical Impairments Affecting Rehab Potential  full axillary node dissection, previous chemo and radiation.    PT Frequency 3x / week  PT Duration 4 weeks   PT Treatment/Interventions Manual techniques;Therapeutic exercise;Passive range of motion;Patient/family education;Compression bandaging;Vasopneumatic Device;ADLs/Self Care Home Management;DME Instruction;Therapeutic activities;Orthotic Fit/Training;Manual lymph drainage;Scar mobilization   PT Next Visit Plan check  reduction kit, reinforce diaphragmatic breathing and chest clearance with self manual lymph drainage. Do shoulder stretching home program  with dowel rod wall stretches, supine scap series progression with low rows for scap retraction.    Consulted and Agree with Plan of Care Patient      Patient will benefit from skilled therapeutic intervention in order to improve the following deficits and impairments:  Decreased range of motion, Pain, Impaired UE functional use, Increased fascial restricitons, Decreased strength, Decreased knowledge of use of DME, Decreased knowledge of precautions, Decreased scar mobility, Increased edema  Visit Diagnosis: Postmastectomy lymphedema  Chronic right shoulder pain  Stiffness of right shoulder, not elsewhere classified     Problem List Patient Active Problem List   Diagnosis Date Noted  . Genetic testing 08/16/2015  . Iron deficiency anemia due to chronic blood loss 08/03/2015  . Family history of breast cancer   . Family history of colon cancer   . Family history of prostate cancer   . Breast cancer of lower-outer quadrant of right female breast ([place]) 07/29/2015  . Leiomyoma of uterus, unspecified 08/01/2012  . Irregular menstrual cycle 08/01/2012  . 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  . HTN (hypertension) 12/22/2010  . Anemia 12/22/2010  . Acid reflux 12/22/2010   Donato Heinz. Owens Shark PT  Norwood Levo 08/10/2016, 12:10 PM  [place], [place], [address] Phone: [phone]   Fax:  [phone]  Name: Melanie Moss MRN: [id number] Date of Birth: [birth year]

## 2016-08-13 ENCOUNTER — Telehealth: Payer: Self-pay | Admitting: Hematology

## 2016-08-13 ENCOUNTER — Ambulatory Visit: Payer: BLUE CROSS/BLUE SHIELD

## 2016-08-13 DIAGNOSIS — M25511 Pain in right shoulder: Secondary | ICD-10-CM

## 2016-08-13 DIAGNOSIS — G8929 Other chronic pain: Secondary | ICD-10-CM

## 2016-08-13 DIAGNOSIS — M25611 Stiffness of right shoulder, not elsewhere classified: Secondary | ICD-10-CM

## 2016-08-13 DIAGNOSIS — I972 Postmastectomy lymphedema syndrome: Secondary | ICD-10-CM

## 2016-08-13 MED FILL — TAMOXIFEN 20 MG TABLET: 20 | 90 days supply | Qty: 90 | Fill #0

## 2016-08-13 NOTE — Telephone Encounter (Signed)
Completed FMLA/Disability forms and faxed to [facility] fax [phone] on 08/10/16. Also called patient to advise that I left a copy at front desk for her to pickup. Scanned copy of completed forms into Epic

## 2016-08-13 NOTE — Therapy (Signed)
[place], [place], [address] Phone: [phone]   Fax:  [phone]  Physical Therapy Treatment  Patient Details  Name: Melanie Moss MRN: [id number] Date of Birth: 07-05-1975 Referring Provider: Dr. Excell Seltzer   Encounter Date: 08/13/2016      PT End of Session - 08/13/16 1203    Visit Number 8   Number of Visits 13   Date for PT Re-Evaluation 08/31/16   PT Start Time 1114   PT Stop Time 1155   PT Time Calculation (min) 41 min   Activity Tolerance Patient tolerated treatment well      Past Medical History:  Diagnosis Date  . Anemia   . Anxiety   . Breast cancer of lower-outer quadrant of right female breast ([place]) 07/29/2015  . Family history of breast cancer   . Family history of colon cancer   . Family history of prostate cancer   . GERD (gastroesophageal reflux disease)    tums   . Gestational diabetes   . Hyperlipidemia   . Hypertension    not currently on medication  . Preterm labor     Past Surgical History:  Procedure Laterality Date  . AXILLARY LYMPH NODE DISSECTION Right 09/29/2015   Procedure: AXILLARY LYMPH NODE DISSECTION;  Surgeon: Excell Seltzer, MD;  Location: [facility];  Service: General;  Laterality: Right;  . BREAST RECONSTRUCTION WITH PLACEMENT OF TISSUE EXPANDER AND FLEX HD (ACELLULAR HYDRATED DERMIS) Right 09/29/2015   Procedure: BREAST RECONSTRUCTION WITH PLACEMENT OF TISSUE EXPANDER AND FLEX HD (ACELLULAR HYDRATED DERMIS);  Surgeon: Loel Lofty Dillingham, DO;  Location: [facility];  Service: Plastics;  Laterality: Right;  . CERVICAL CERCLAGE  02/19/2011   Procedure: CERCLAGE CERVICAL;  Surgeon: Agnes Lawrence, MD;  Location: [facility];  Service: Gynecology;  Laterality: N/A;  . DILATION AND CURETTAGE OF UTERUS    . MASTECTOMY WITH AXILLARY LYMPH NODE DISSECTION Right 09/29/2015  . PORTACATH PLACEMENT N/A 09/29/2015   Procedure: INSERTION PORT-A-CATH;  Surgeon: Excell Seltzer, MD;  Location: [facility];  Service: General;  Laterality: N/A;  . SIMPLE MASTECTOMY WITH AXILLARY SENTINEL NODE BIOPSY Right 09/29/2015   Procedure: RIGHT TOTAL  MASTECTOMY WITH AXILLARY SENTINEL NODE BIOPSY;  Surgeon: Excell Seltzer, MD;  Location: [facility];  Service: General;  Laterality: Right;    There were no vitals filed for this visit.      Subjective Assessment - 08/13/16 1115    Subjective I got the circaid reduction kit Friday and I've been wearing it pretty consistently all weekend. I like it better than the bandages.    Pertinent History June 8th right side mastectomy and full axillary lymph node dissection, has had chemotherapy and radiation pt currently has an expander on R.  wear compression sleeve and gauntlet to try to control lymphedema   Patient Stated Goals to get swelling under control so she can get back to work    Currently in Pain? No/denies               LYMPHEDEMA/ONCOLOGY QUESTIONNAIRE - 08/13/16 1116      Right Upper Extremity Lymphedema   15 cm Proximal to Olecranon Process 29.9 cm   10 cm Proximal to Olecranon Process 30.3 cm   Olecranon Process 26.5 cm   15 cm Proximal to Ulnar Styloid Process 26.9 cm   10 cm Proximal to Ulnar Styloid Process 23.4 cm   Just Proximal to Ulnar Styloid Process 17.1 cm   Across Hand at Thumb  Web Space 19.8 cm   At [place] of 2nd Digit 6 cm                  [facility] Adult PT Treatment/Exercise - 08/13/16 0001      Manual Therapy   Manual Lymphatic Drainage (MLD) In Supine: Short neck, superficial and deep abdominals (continued with instruction of diaphragmatic breathing, pt will cont to need reinforcement of this), Rt inguinal and Lt axillary nodes, Rt axillo-inguinal and anterior inter-axillary anastomosis, and Rt UE from dorsal hand to lateral shoulder working from proximal to distal then retracing all steps.   Passive ROM PROM to left shoulder into flexion and abcution within pain limits, but pt did report  pain at end rane was some improved today.                        [facility] Goals - 08/10/16 1208      CC Long Term Goal  #1   Title Pt will reduce circumference of right forearm at 10 cm proximal to ulnar styloid to 22 cm    Baseline 24.6cm, 24 cm on 08/10/2016   Time 4   Period Weeks   Status On-going     CC Long Term Goal  #2   Title Pt will reduce circumference of right forearm at 15 cm proximal to ulnar styloid to 26 cm    Baseline 28.5 on eval, 26.3 on 08/10/2016   Time 4   Period Weeks   Status On-going     CC Long Term Goal  #3   Title Pt will demonstrate 150 degrees of shoulder flexion to allow pt to reach items off a high shelf    Baseline 136   Period Weeks   Status On-going     CC Long Term Goal  #4   Title Pt will demonstrate at least 50 degrees of right shoulder internal  rotation to all her to return to prior level of function   Baseline 30   Time 8   Status On-going     CC Long Term Goal  #5   Title Pt will be independent in a home exercise program for continued strengthening and stretching    Time 4   Period Weeks   Status On-going            Plan - 08/13/16 1203    Clinical Impression Statement Pt came in wearing her circaid reduction garment on arm and hand today with small dotted foam at her forearm. Remeasured her arm to assess from wear over weekend and she had some increase at her finger/hand but noticeable swelling her and visibly smaller at wrist than hand so when she reapplied garments after session advised her to make wrist a little looser since hand overlaps her wrist her as well. Pt verbalized understanding and applied this properly. Also cut and issued 1/2" gray foam for pt to wear at dorsal hand to help decrease increased swelling here.    Rehab Potential Good   Clinical Impairments Affecting Rehab Potential  full axillary node dissection, previous chemo and radiation.    PT Frequency 3x / week   PT Duration 4 weeks    PT Treatment/Interventions Manual techniques;Therapeutic exercise;Passive range of motion;Patient/family education;Compression bandaging;Vasopneumatic Device;ADLs/Self Care Home Management;DME Instruction;Therapeutic activities;Orthotic Fit/Training;Manual lymph drainage;Scar mobilization   PT Next Visit Plan check  foam in reduction kit, reinforce diaphragmatic breathing and chest clearance with self manual lymph drainage. Do shoulder stretching  home program  with dowel rod wall stretches, supine scap series progression with low rows for scap retraction.    Consulted and Agree with Plan of Care Patient      Patient will benefit from skilled therapeutic intervention in order to improve the following deficits and impairments:  Decreased range of motion, Pain, Impaired UE functional use, Increased fascial restricitons, Decreased strength, Decreased knowledge of use of DME, Decreased knowledge of precautions, Decreased scar mobility, Increased edema  Visit Diagnosis: Postmastectomy lymphedema  Chronic right shoulder pain  Stiffness of right shoulder, not elsewhere classified     Problem List Patient Active Problem List   Diagnosis Date Noted  . Genetic testing 08/16/2015  . Iron deficiency anemia due to chronic blood loss 08/03/2015  . Family history of breast cancer   . Family history of colon cancer   . Family history of prostate cancer   . Breast cancer of lower-outer quadrant of right female breast ([place]) 07/29/2015  . Leiomyoma of uterus, unspecified 08/01/2012  . Irregular menstrual cycle 08/01/2012  . 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  . HTN (hypertension) 12/22/2010  . Anemia 12/22/2010  . Acid reflux 12/22/2010    Otelia Limes, PTA 08/13/2016, 12:19 PM  [place], [place], [address] Phone: [phone]   Fax:  [phone]  Name: SAMAYRA HEBEL MRN: [id number] Date of Birth:  Jan 26, 1976

## 2016-08-15 ENCOUNTER — Ambulatory Visit: Payer: BLUE CROSS/BLUE SHIELD

## 2016-08-15 DIAGNOSIS — M25611 Stiffness of right shoulder, not elsewhere classified: Secondary | ICD-10-CM

## 2016-08-15 DIAGNOSIS — G8929 Other chronic pain: Secondary | ICD-10-CM

## 2016-08-15 DIAGNOSIS — I972 Postmastectomy lymphedema syndrome: Secondary | ICD-10-CM

## 2016-08-15 DIAGNOSIS — M25511 Pain in right shoulder: Secondary | ICD-10-CM

## 2016-08-15 NOTE — Therapy (Signed)
[place], [place], [address] Phone: [phone]   Fax:  [phone]  Physical Therapy Treatment  Patient Details  Name: Melanie Moss MRN: [id number] Date of Birth: 10/12/1975 Referring Provider: Dr. Excell Seltzer   Encounter Date: 08/15/2016      PT End of Session - 08/15/16 1205    Visit Number 9   Number of Visits 13   Date for PT Re-Evaluation 08/31/16   PT Start Time 1104   PT Stop Time 1200   PT Time Calculation (min) 56 min   Activity Tolerance Patient tolerated treatment well   Behavior During Therapy [facility] for tasks assessed/performed      Past Medical History:  Diagnosis Date  . Anemia   . Anxiety   . Breast cancer of lower-outer quadrant of right female breast ([place]) 07/29/2015  . Family history of breast cancer   . Family history of colon cancer   . Family history of prostate cancer   . GERD (gastroesophageal reflux disease)    tums   . Gestational diabetes   . Hyperlipidemia   . Hypertension    not currently on medication  . Preterm labor     Past Surgical History:  Procedure Laterality Date  . AXILLARY LYMPH NODE DISSECTION Right 09/29/2015   Procedure: AXILLARY LYMPH NODE DISSECTION;  Surgeon: Excell Seltzer, MD;  Location: [facility];  Service: General;  Laterality: Right;  . BREAST RECONSTRUCTION WITH PLACEMENT OF TISSUE EXPANDER AND FLEX HD (ACELLULAR HYDRATED DERMIS) Right 09/29/2015   Procedure: BREAST RECONSTRUCTION WITH PLACEMENT OF TISSUE EXPANDER AND FLEX HD (ACELLULAR HYDRATED DERMIS);  Surgeon: Loel Lofty Dillingham, DO;  Location: [facility];  Service: Plastics;  Laterality: Right;  . CERVICAL CERCLAGE  02/19/2011   Procedure: CERCLAGE CERVICAL;  Surgeon: Agnes Lawrence, MD;  Location: [facility];  Service: Gynecology;  Laterality: N/A;  . DILATION AND CURETTAGE OF UTERUS    . MASTECTOMY WITH AXILLARY LYMPH NODE DISSECTION Right 09/29/2015  . PORTACATH PLACEMENT N/A 09/29/2015   Procedure: INSERTION PORT-A-CATH;  Surgeon: Excell Seltzer, MD;  Location: [facility];  Service: General;  Laterality: N/A;  . SIMPLE MASTECTOMY WITH AXILLARY SENTINEL NODE BIOPSY Right 09/29/2015   Procedure: RIGHT TOTAL  MASTECTOMY WITH AXILLARY SENTINEL NODE BIOPSY;  Surgeon: Excell Seltzer, MD;  Location: [facility];  Service: General;  Laterality: Right;    There were no vitals filed for this visit.      Subjective Assessment - 08/15/16 1109    Subjective Had my pump demonstration this morning and I really liked that so I'm going to get it. It felt really good! I am still having trouble with getting the compression from the straps right on my arm. My forearm is pretty swollen now but in a different spot.    Pertinent History June 8th right side mastectomy and full axillary lymph node dissection, has had chemotherapy and radiation pt currently has an expander on R.  wear compression sleeve and gauntlet to try to control lymphedema   Patient Stated Goals to get swelling under control so she can get back to work    Currently in Pain? No/denies            [facility] PT Assessment - 08/15/16 0001      AROM   Right Shoulder Flexion 144 Degrees  Pain at end ROM   Right Shoulder ABduction 132 Degrees  [facility] Adult PT Treatment/Exercise - 08/15/16 0001      Manual Therapy   Manual Therapy Manual Lymphatic Drainage (MLD);Passive ROM;Edema management   Edema Management Worked with pt on once reduction garment properly donned how to measure correct amount of compression at each strap with Medi compression gauge which came with her garment. Also issued 1/2" compression foam for pt to wear at forearm under garment and she reported this felt comfortable after donned.    Manual Lymphatic Drainage (MLD) In Supine: Short neck, superficial and deep abdominals (continued with instruction of diaphragmatic breathing, but pt did show some improvement with this today), Rt inguinal  and Lt axillary nodes, Rt axillo-inguinal and anterior inter-axillary anastomosis, and Rt UE from dorsal hand to lateral shoulder working from proximal to distal then retracing all steps.   Passive ROM PROM to left shoulder into flexion and abduction within pain limits, but pt did report pain at end range was some improved today.                        [facility] Goals - 08/10/16 1208      CC Long Term Goal  #1   Title Pt will reduce circumference of right forearm at 10 cm proximal to ulnar styloid to 22 cm    Baseline 24.6cm, 24 cm on 08/10/2016   Time 4   Period Weeks   Status On-going     CC Long Term Goal  #2   Title Pt will reduce circumference of right forearm at 15 cm proximal to ulnar styloid to 26 cm    Baseline 28.5 on eval, 26.3 on 08/10/2016   Time 4   Period Weeks   Status On-going     CC Long Term Goal  #3   Title Pt will demonstrate 150 degrees of shoulder flexion to allow pt to reach items off a high shelf    Baseline 136   Period Weeks   Status On-going     CC Long Term Goal  #4   Title Pt will demonstrate at least 50 degrees of right shoulder internal  rotation to all her to return to prior level of function   Baseline 30   Time 8   Status On-going     CC Long Term Goal  #5   Title Pt will be independent in a home exercise program for continued strengthening and stretching    Time 4   Period Weeks   Status On-going            Plan - 08/15/16 1206    Clinical Impression Statement Pt came in wearing her Circaid reduction garment again but was reporting difficulty in maintaining her reductions at forearm. When inquired about how pt was measureing consistency of compression she didn't know about compression gague that came with garment so instructed her in use of this today and improtance of doing at each strap. Also issued compression foam for pt to wear at forearm to further assist in helping her achieve and maintain reducitons here.  She felt better after leaving session today about her garment and use of it properly. Her Rt shoulder A/ROM has improved with flexion but decreased and with pain with abduction. Once pt feels she has better control over use and wear of compression garment would like to focus more on Rt shoulder ROM deficits and pt is agreeable to this.    Rehab Potential Good   Clinical Impairments Affecting Rehab  Potential  full axillary node dissection, previous chemo and radiation.    PT Frequency 3x / week   PT Duration 4 weeks   PT Treatment/Interventions Manual techniques;Therapeutic exercise;Passive range of motion;Patient/family education;Compression bandaging;Vasopneumatic Device;ADLs/Self Care Home Management;DME Instruction;Therapeutic activities;Orthotic Fit/Training;Manual lymph drainage;Scar mobilization   PT Next Visit Plan check  foam in reduction kit, reinforce diaphragmatic breathing and chest clearance with self manual lymph drainage. Do shoulder stretching home program  with dowel rod wall stretches, supine scap series progression with low rows for scap retraction.    PT Home Exercise Plan Use Medi compression gauge each time she dons garment.   Consulted and Agree with Plan of Care Patient      Patient will benefit from skilled therapeutic intervention in order to improve the following deficits and impairments:  Decreased range of motion, Pain, Impaired UE functional use, Increased fascial restricitons, Decreased strength, Decreased knowledge of use of DME, Decreased knowledge of precautions, Decreased scar mobility, Increased edema  Visit Diagnosis: Postmastectomy lymphedema  Chronic right shoulder pain  Stiffness of right shoulder, not elsewhere classified     Problem List Patient Active Problem List   Diagnosis Date Noted  . Genetic testing 08/16/2015  . Iron deficiency anemia due to chronic blood loss 08/03/2015  . Family history of breast cancer   . Family history of colon  cancer   . Family history of prostate cancer   . Breast cancer of lower-outer quadrant of right female breast ([place]) 07/29/2015  . Leiomyoma of uterus, unspecified 08/01/2012  . Irregular menstrual cycle 08/01/2012  . 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  . HTN (hypertension) 12/22/2010  . Anemia 12/22/2010  . Acid reflux 12/22/2010    Otelia Limes, PTA 08/15/2016, 12:10 PM  [place], [place], [address] Phone: [phone]   Fax:  [phone]  Name: Melanie Moss MRN: [id number] Date of Birth: [birth year]

## 2016-08-17 ENCOUNTER — Ambulatory Visit: Payer: BLUE CROSS/BLUE SHIELD | Admitting: Physical Therapy

## 2016-08-20 ENCOUNTER — Ambulatory Visit: Payer: BLUE CROSS/BLUE SHIELD

## 2016-08-20 DIAGNOSIS — M25511 Pain in right shoulder: Secondary | ICD-10-CM

## 2016-08-20 DIAGNOSIS — I972 Postmastectomy lymphedema syndrome: Secondary | ICD-10-CM

## 2016-08-20 DIAGNOSIS — M25611 Stiffness of right shoulder, not elsewhere classified: Secondary | ICD-10-CM

## 2016-08-20 DIAGNOSIS — G8929 Other chronic pain: Secondary | ICD-10-CM

## 2016-08-20 NOTE — Therapy (Signed)
[place], [place], [address] Phone: [phone]   Fax:  [phone]  Physical Therapy Treatment  Patient Details  Name: Melanie Moss MRN: [id number] Date of Birth: 09-Nov-1975 Referring Provider: Dr. Excell Seltzer   Encounter Date: 08/20/2016      PT End of Session - 08/20/16 1103    Visit Number 10   Number of Visits 13   Date for PT Re-Evaluation 08/31/16   PT Start Time 0941   PT Stop Time 1022   PT Time Calculation (min) 41 min   Activity Tolerance Patient tolerated treatment well   Behavior During Therapy [facility] for tasks assessed/performed      Past Medical History:  Diagnosis Date  . Anemia   . Anxiety   . Breast cancer of lower-outer quadrant of right female breast ([place]) 07/29/2015  . Family history of breast cancer   . Family history of colon cancer   . Family history of prostate cancer   . GERD (gastroesophageal reflux disease)    tums   . Gestational diabetes   . Hyperlipidemia   . Hypertension    not currently on medication  . Preterm labor     Past Surgical History:  Procedure Laterality Date  . AXILLARY LYMPH NODE DISSECTION Right 09/29/2015   Procedure: AXILLARY LYMPH NODE DISSECTION;  Surgeon: Excell Seltzer, MD;  Location: [place];  Service: General;  Laterality: Right;  . BREAST RECONSTRUCTION WITH PLACEMENT OF TISSUE EXPANDER AND FLEX HD (ACELLULAR HYDRATED DERMIS) Right 09/29/2015   Procedure: BREAST RECONSTRUCTION WITH PLACEMENT OF TISSUE EXPANDER AND FLEX HD (ACELLULAR HYDRATED DERMIS);  Surgeon: Loel Lofty Dillingham, DO;  Location: [place];  Service: Plastics;  Laterality: Right;  . CERVICAL CERCLAGE  02/19/2011   Procedure: CERCLAGE CERVICAL;  Surgeon: Agnes Lawrence, MD;  Location: [facility];  Service: Gynecology;  Laterality: N/A;  . DILATION AND CURETTAGE OF UTERUS    . MASTECTOMY WITH AXILLARY LYMPH NODE DISSECTION Right 09/29/2015  . PORTACATH PLACEMENT N/A 09/29/2015   Procedure: INSERTION PORT-A-CATH;  Surgeon: Excell Seltzer, MD;  Location: [place];  Service: General;  Laterality: N/A;  . SIMPLE MASTECTOMY WITH AXILLARY SENTINEL NODE BIOPSY Right 09/29/2015   Procedure: RIGHT TOTAL  MASTECTOMY WITH AXILLARY SENTINEL NODE BIOPSY;  Surgeon: Excell Seltzer, MD;  Location: [place];  Service: General;  Laterality: Right;    There were no vitals filed for this visit.      Subjective Assessment - 08/20/16 0944    Subjective I'm sorry I had to cancel my appointment Friday but my car wouldn't start. But I feel like I'm getting the hang of wearing the sleeve better and I noticed reductions over the weekend. I've been using the Medi compression gauge to measure the compression as well.    Pertinent History June 8th right side mastectomy and full axillary lymph node dissection, has had chemotherapy and radiation pt currently has an expander on R.  wear compression sleeve and gauntlet to try to control lymphedema   Patient Stated Goals to get swelling under control so she can get back to work    Currently in Pain? No/denies            [facility] PT Assessment - 08/20/16 0001      AROM   Right Shoulder Flexion 142 Degrees   Right Shoulder ABduction 159 Degrees   Left Shoulder Flexion 159 Degrees   Left Shoulder ABduction 162 Degrees  LYMPHEDEMA/ONCOLOGY QUESTIONNAIRE - 08/20/16 0945      Right Upper Extremity Lymphedema   15 cm Proximal to Olecranon Process 29.6 cm   10 cm Proximal to Olecranon Process 30 cm   Olecranon Process 26.8 cm   15 cm Proximal to Ulnar Styloid Process 26.1 cm   10 cm Proximal to Ulnar Styloid Process 23.4 cm   Just Proximal to Ulnar Styloid Process 17.6 cm   Across Hand at [employer] 19.8 cm   At [place] of 2nd Digit 6.1 cm                  [facility] Adult PT Treatment/Exercise - 08/20/16 0001      Shoulder Exercises: Pulleys   Flexion 2 minutes   Flexion Limitations VC to remind pt to decrease scapular  compensation.   ABduction 2 minutes     Shoulder Exercises: Therapy Ball   Flexion 10 reps  With forward lean into end of stretch   ABduction 5 reps  Rt UE with side lean into end of stretch     Manual Therapy   Edema Management Assisted pt with donning Reduction compression garment after session using Medi BPS guide card for consistent compression  at all straps.   Manual Lymphatic Drainage (MLD) In Supine: Short neck, superficial and deep abdominals (continued with instruction of diaphragmatic breathing, but pt did show some improvement with this today), Rt inguinal and Lt axillary nodes, Rt axillo-inguinal and anterior inter-axillary anastomosis, and Rt UE from dorsal hand to lateral shoulder working from proximal to distal then retracing all steps.   Passive ROM --                        Long Term Clinic Goals - 08/10/16 1208      CC Long Term Goal  #1   Title Pt will reduce circumference of right forearm at 10 cm proximal to ulnar styloid to 22 cm    Baseline 24.6cm, 24 cm on 08/10/2016   Time 4   Period Weeks   Status On-going     CC Long Term Goal  #2   Title Pt will reduce circumference of right forearm at 15 cm proximal to ulnar styloid to 26 cm    Baseline 28.5 on eval, 26.3 on 08/10/2016   Time 4   Period Weeks   Status On-going     CC Long Term Goal  #3   Title Pt will demonstrate 150 degrees of shoulder flexion to allow pt to reach items off a high shelf    Baseline 136   Period Weeks   Status On-going     CC Long Term Goal  #4   Title Pt will demonstrate at least 50 degrees of right shoulder internal  rotation to all her to return to prior level of function   Baseline 30   Time 8   Status On-going     CC Long Term Goal  #5   Title Pt will be independent in a home exercise program for continued strengthening and stretching    Time 4   Period Weeks   Status On-going            Plan - 08/20/16 1104    Clinical Impression Statement Pt  continues with wear of her Cricaid reduction kit reporting feeling she is tolerating htis better and using it better. She has been using the Medi included BPS guide to make sure she has consistent  compression at each strap and she reports this made a big difference. Also has been using the gray compression foam at forearm and this has been helping as well. Her Rt UE abduction has improved greatly, flexion about same as last week. Pt will benefit from continued therapy to help her continue to become more independent with wear of her reduction kit until she is ready to be measured for compression sleeve and to help her regain end A/ROM.    Rehab Potential Good   Clinical Impairments Affecting Rehab Potential  full axillary node dissection, previous chemo and radiation.    PT Frequency 3x / week   PT Duration 4 weeks   PT Treatment/Interventions Manual techniques;Therapeutic exercise;Passive range of motion;Patient/family education;Compression bandaging;Vasopneumatic Device;ADLs/Self Care Home Management;DME Instruction;Therapeutic activities;Orthotic Fit/Training;Manual lymph drainage;Scar mobilization   PT Next Visit Plan check  foam in reduction kit, reinforce diaphragmatic breathing and chest clearance with self manual lymph drainage. Cont AA/ROM for Rt shoulder and add supine scap series progression with low rows for scap retraction.    Consulted and Agree with Plan of Care Patient      Patient will benefit from skilled therapeutic intervention in order to improve the following deficits and impairments:  Decreased range of motion, Pain, Impaired UE functional use, Increased fascial restricitons, Decreased strength, Decreased knowledge of use of DME, Decreased knowledge of precautions, Decreased scar mobility, Increased edema  Visit Diagnosis: Postmastectomy lymphedema  Chronic right shoulder pain  Stiffness of right shoulder, not elsewhere classified     Problem List Patient Active Problem  List   Diagnosis Date Noted  . Genetic testing 08/16/2015  . Iron deficiency anemia due to chronic blood loss 08/03/2015  . Family history of breast cancer   . Family history of colon cancer   . Family history of prostate cancer   . Breast cancer of lower-outer quadrant of right female breast ([place]) 07/29/2015  . Leiomyoma of uterus, unspecified 08/01/2012  . Irregular menstrual cycle 08/01/2012  . 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  . HTN (hypertension) 12/22/2010  . Anemia 12/22/2010  . Acid reflux 12/22/2010    Otelia Limes, PTA 08/20/2016, 11:09 AM  [place], [place], [address] Phone: [phone]   Fax:  [phone]  Name: Melanie Moss MRN: [id number] Date of Birth: 1975-12-09

## 2016-08-23 ENCOUNTER — Ambulatory Visit: Payer: BLUE CROSS/BLUE SHIELD

## 2016-08-24 ENCOUNTER — Ambulatory Visit: Payer: BLUE CROSS/BLUE SHIELD | Admitting: Physical Therapy

## 2016-08-24 ENCOUNTER — Encounter: Payer: BLUE CROSS/BLUE SHIELD | Admitting: Physical Therapy

## 2016-08-27 ENCOUNTER — Ambulatory Visit: Payer: BLUE CROSS/BLUE SHIELD | Attending: General Surgery

## 2016-08-27 DIAGNOSIS — M25511 Pain in right shoulder: Secondary | ICD-10-CM | POA: Insufficient documentation

## 2016-08-27 DIAGNOSIS — G8929 Other chronic pain: Secondary | ICD-10-CM | POA: Diagnosis present

## 2016-08-27 DIAGNOSIS — M25611 Stiffness of right shoulder, not elsewhere classified: Secondary | ICD-10-CM | POA: Insufficient documentation

## 2016-08-27 DIAGNOSIS — I972 Postmastectomy lymphedema syndrome: Secondary | ICD-10-CM | POA: Diagnosis present

## 2016-08-27 NOTE — Therapy (Signed)
[place], [place], [address] Phone: [phone]   Fax:  [phone]  Physical Therapy Treatment  Patient Details  Name: Melanie Moss MRN: [id number] Date of Birth: June 19, 1975 Referring Provider: Dr. Excell Seltzer   Encounter Date: 08/27/2016      PT End of Session - 08/27/16 0932    Visit Number 11   Number of Visits 13   Date for PT Re-Evaluation 08/31/16   PT Start Time 0846   PT Stop Time 0931   PT Time Calculation (min) 45 min   Activity Tolerance Patient tolerated treatment well   Behavior During Therapy [facility] for tasks assessed/performed      Past Medical History:  Diagnosis Date  . Anemia   . Anxiety   . Breast cancer of lower-outer quadrant of right female breast ([place]) 07/29/2015  . Family history of breast cancer   . Family history of colon cancer   . Family history of prostate cancer   . GERD (gastroesophageal reflux disease)    tums   . Gestational diabetes   . Hyperlipidemia   . Hypertension    not currently on medication  . Preterm labor     Past Surgical History:  Procedure Laterality Date  . AXILLARY LYMPH NODE DISSECTION Right 09/29/2015   Procedure: AXILLARY LYMPH NODE DISSECTION;  Surgeon: Excell Seltzer, MD;  Location: [place];  Service: General;  Laterality: Right;  . BREAST RECONSTRUCTION WITH PLACEMENT OF TISSUE EXPANDER AND FLEX HD (ACELLULAR HYDRATED DERMIS) Right 09/29/2015   Procedure: BREAST RECONSTRUCTION WITH PLACEMENT OF TISSUE EXPANDER AND FLEX HD (ACELLULAR HYDRATED DERMIS);  Surgeon: Loel Lofty Dillingham, DO;  Location: [place];  Service: Plastics;  Laterality: Right;  . CERVICAL CERCLAGE  02/19/2011   Procedure: CERCLAGE CERVICAL;  Surgeon: Agnes Lawrence, MD;  Location: [facility];  Service: Gynecology;  Laterality: N/A;  . DILATION AND CURETTAGE OF UTERUS    . MASTECTOMY WITH AXILLARY LYMPH NODE DISSECTION Right 09/29/2015  . PORTACATH PLACEMENT N/A 09/29/2015   Procedure: INSERTION PORT-A-CATH;  Surgeon: Excell Seltzer, MD;  Location: [place];  Service: General;  Laterality: N/A;  . SIMPLE MASTECTOMY WITH AXILLARY SENTINEL NODE BIOPSY Right 09/29/2015   Procedure: RIGHT TOTAL  MASTECTOMY WITH AXILLARY SENTINEL NODE BIOPSY;  Surgeon: Excell Seltzer, MD;  Location: [place];  Service: General;  Laterality: Right;    There were no vitals filed for this visit.      Subjective Assessment - 08/27/16 0849    Subjective I feel like I've had more swelling in my arm since starting to use the pump. It came on Thursday and I've used every day since then. My arm feels good right after I'm finished using the pump but feel like it starts to swell up even more after.    Pertinent History June 8th right side mastectomy and full axillary lymph node dissection, has had chemotherapy and radiation pt currently has an expander on R.  wear compression sleeve and gauntlet to try to control lymphedema   Patient Stated Goals to get swelling under control so she can get back to work    Currently in Pain? No/denies                         [facility] Adult PT Treatment/Exercise - 08/27/16 0001      Manual Therapy   Edema Management Assisted pt with donning Reduction compression garment after session using Medi BPS guide card for consistent  compression  at all straps.   Manual Lymphatic Drainage (MLD) In Supine: Short neck, superficial and deep abdominals (continued with instruction of diaphragmatic breathing, but pt did show some improvement with this today), Rt inguinal and Lt axillary nodes, Rt axillo-inguinal and anterior inter-axillary anastomosis, and Rt UE from dorsal hand to lateral shoulder working from proximal to distal then retracing all steps.                PT Education - 08/27/16 0850    Education provided Yes   Education Details Spoke with pt about performing initial sequences of self MLD up to pathways before and after using the pump to see  if this will help her Rt UQ decongest the fluid better decrreasing the backflow she has been seeing.    Person(s) Educated Patient   Methods Explanation;Demonstration   Comprehension Verbalized understanding                Long Term Clinic Goals - 08/10/16 1208      CC Long Term Goal  #1   Title Pt will reduce circumference of right forearm at 10 cm proximal to ulnar styloid to 22 cm    Baseline 24.6cm, 24 cm on 08/10/2016   Time 4   Period Weeks   Status On-going     CC Long Term Goal  #2   Title Pt will reduce circumference of right forearm at 15 cm proximal to ulnar styloid to 26 cm    Baseline 28.5 on eval, 26.3 on 08/10/2016   Time 4   Period Weeks   Status On-going     CC Long Term Goal  #3   Title Pt will demonstrate 150 degrees of shoulder flexion to allow pt to reach items off a high shelf    Baseline 136   Period Weeks   Status On-going     CC Long Term Goal  #4   Title Pt will demonstrate at least 50 degrees of right shoulder internal  rotation to all her to return to prior level of function   Baseline 30   Time 8   Status On-going     CC Long Term Goal  #5   Title Pt will be independent in a home exercise program for continued strengthening and stretching    Time 4   Period Weeks   Status On-going            Plan - 08/27/16 0932    Clinical Impression Statement Pt came in with visibly increased swelling today as she reports she has been noticing using the pump daily since Thursday. So focused on manual lymph drainage today focusing on fibrotic tissues at forearm and back of hand. Tissue was softer by end of treatment. Pt to begin performing initial sequences of self MLD up to pathways before and after compression pump.    Rehab Potential Good   Clinical Impairments Affecting Rehab Potential  full axillary node dissection, previous chemo and radiation.    PT Frequency 3x / week   PT Duration 4 weeks   PT Treatment/Interventions Manual  techniques;Therapeutic exercise;Passive range of motion;Patient/family education;Compression bandaging;Vasopneumatic Device;ADLs/Self Care Home Management;DME Instruction;Therapeutic activities;Orthotic Fit/Training;Manual lymph drainage;Scar mobilization   PT Next Visit Plan Manual lymph drainage prn; begin focusing on AA/ROM for Rt shoulder and add supine scap series progression with low rows for scap retraction.    Consulted and Agree with Plan of Care Patient      Patient will benefit from skilled therapeutic  intervention in order to improve the following deficits and impairments:  Decreased range of motion, Pain, Impaired UE functional use, Increased fascial restricitons, Decreased strength, Decreased knowledge of use of DME, Decreased knowledge of precautions, Decreased scar mobility, Increased edema  Visit Diagnosis: Postmastectomy lymphedema     Problem List Patient Active Problem List   Diagnosis Date Noted  . Genetic testing 08/16/2015  . Iron deficiency anemia due to chronic blood loss 08/03/2015  . Family history of breast cancer   . Family history of colon cancer   . Family history of prostate cancer   . Breast cancer of lower-outer quadrant of right female breast ([place]) 07/29/2015  . Leiomyoma of uterus, unspecified 08/01/2012  . Irregular menstrual cycle 08/01/2012  . 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  . HTN (hypertension) 12/22/2010  . Anemia 12/22/2010  . Acid reflux 12/22/2010    Otelia Limes, PTA 08/27/2016, 9:40 AM  [place], [place], [address] Phone: [phone]   Fax:  [phone]  Name: Melanie Moss MRN: [id number] Date of Birth: May 30, 1975

## 2016-08-28 NOTE — Progress Notes (Signed)
[name]  [phone] [phone]  Clinic Follow Up Note   Patient Care Team: Lucianne Lei, MD as PCP - General (Family Medicine) Excell Seltzer, MD as Consulting Physician (General Surgery) Thea Silversmith, MD (Inactive) as Consulting Physician (Radiation Oncology) 08/30/2016  CHIEF COMPLAINTS:  Follow up right breast cancer  Oncology History   Breast cancer of lower-outer quadrant of right female breast [facility])   Staging form: Breast, AJCC 7th Edition     Clinical stage from 08/03/2015: Stage IA (T1c, N0, M0) - Unsigned     Pathologic stage from 09/29/2015: Stage IIIA (T3(m), N1a, cM0) - Signed by Truitt Merle, MD on 10/17/2015         Breast cancer of lower-outer quadrant of right female breast ([place])   07/25/2015 Mammogram    diagnostic mammogram and ultrasound showed a 11 mm mass in the 8:00 position of the right breast, small irregular lesion in the retroareolar regio of the right breast, contiguous with the dermis      07/26/2015 Initial Diagnosis    Breast cancer of lower-outer quadrant of right female breast ([place])      07/26/2015 Initial Biopsy    right breast needle core biopsy at 8:00 position (1), invasive ductal carcinoma, grade 3, 6:30 o'clock position invasive (2) and in situ ductal carcinoma, grade 1-2, lymphovascular invasion is identified      07/26/2015 Receptors her2    (1) ER 95% positive, PR 95% positive, Ki-67 30%, HER-2 positive with ratio 2.04, copy # 5.20, (2) ER 90% positive, PR 95% positive, Ki-67 10%, HER-2 negative.      08/02/2015 Imaging    breast MRI showed extensive mass  and non-mass enhancement in the lower outer quadrant of the right breast  spanning 13 cm, focal enhancement within the right nipple, at least 3-4 abnormal appearing right axillary lymph node.      09/29/2015 Surgery    right mastectomy and axillar node dissection       09/29/2015 Pathology Results    Right mastectomy and axillary node dissection  showed multifocal mammary carcinoma with ductal and lobular features, tumor size 13 cm, tubal 1 cm, 1.9 cm, 2.5 cm, grade 2-3, anterior margins were positive, 2 nodes with metastasis and one with isolated cells      11/01/2015 - 02/14/2016 Chemotherapy    TCHP every 3 weeks, for 6 cycles, followed by herceptin maintenance therapy for a total of one year       03/06/2016 -  Antibody Plan    Maintenance Herceptin and perjeta, every 3 weeks      03/07/2016 - 04/27/2016 Radiation Therapy    03/07/16-04/27/16 Site/dose: 1) 2fd right chest wall/ 45 Gy in 25 fractions 2) Right mastectomy scar / 16 Gy in 8 fractions      05/21/2016 -  Anti-estrogen oral therapy    Tamoxifen 20 mg       HISTORY OF PRESENTING ILLNESS (08/03/2015):  Melanie MASHA GRUMBINE41y.o. female is here because of Her newly diagnosed right breast cancer. She is accompanied by her fianc, sister and mother to our multidisciplinary breast clinic today.  She felt a breast lump and noticed mild intermittent breast pain 2 weeks ago, no nipple discharge or skin change. She also has moderate fatigue and dyspnea on moderate to heavy exertion for a few months, no weight loss, no other pain or other symptoms.  She had normal mammgram 6 years ago, for a small lump which felt to be benign and no  biopsy was done.    She has iregular period, twice a month, it lasts about 5-7 days, heavy for 3 days, she chanes 6-7 times a day. She was found to be anemic recently, and has started taking oral iron pill a few weeks ago, tolerates well.  GYN HISTORY  Menarchal: 10  LMP: 07/29/2015 Contraceptive: no  HRT: n/a  G4P1: 3 miscarrage, one 13 yo son, no plan to have more children   CURRENT THERAPY: Maintenance Herceptin and perjeta, every 3 weeks, started on 03/06/2016. Tamoxifen 20 mg starting 05/21/16.  INTERIM HISTORY:  Melanie Moss returns for follow-up and maintenance Herceptin treatment. She reports to the clinic today with a compression cast on her  right right arm for lymphedema. She is more tired than normal but still able to move. She forces herself to get up and go. This is recent. She is not yet back to work. She needs to break while moving around the house. She only reports swelling discomfort on the right side. She has not checked her thyroid function today. She is a little anemic lately which may contribute to her tiredness. Crowds or a lot of people bother her and make her feel smothered. She reports mode swings but her hot flashes have improved. She stopped taking effexorr since her hot flashes improved. She took it for a couple of months.      MEDICAL HISTORY:  Past Medical History:  Diagnosis Date  . Anemia   . Anxiety   . Breast cancer of lower-outer quadrant of right female breast ([place]) 07/29/2015  . Family history of breast cancer   . Family history of colon cancer   . Family history of prostate cancer   . GERD (gastroesophageal reflux disease)    tums   . Gestational diabetes   . Hyperlipidemia   . Hypertension    not currently on medication  . Preterm labor     SURGICAL HISTORY: Past Surgical History:  Procedure Laterality Date  . AXILLARY LYMPH NODE DISSECTION Right 09/29/2015   Procedure: AXILLARY LYMPH NODE DISSECTION;  Surgeon: Excell Seltzer, MD;  Location: [place];  Service: General;  Laterality: Right;  . BREAST RECONSTRUCTION WITH PLACEMENT OF TISSUE EXPANDER AND FLEX HD (ACELLULAR HYDRATED DERMIS) Right 09/29/2015   Procedure: BREAST RECONSTRUCTION WITH PLACEMENT OF TISSUE EXPANDER AND FLEX HD (ACELLULAR HYDRATED DERMIS);  Surgeon: Loel Lofty Dillingham, DO;  Location: [place];  Service: Plastics;  Laterality: Right;  . CERVICAL CERCLAGE  02/19/2011   Procedure: CERCLAGE CERVICAL;  Surgeon: Agnes Lawrence, MD;  Location: [facility];  Service: Gynecology;  Laterality: N/A;  . DILATION AND CURETTAGE OF UTERUS    . MASTECTOMY WITH AXILLARY LYMPH NODE DISSECTION Right 09/29/2015  . PORTACATH PLACEMENT N/A 09/29/2015    Procedure: INSERTION PORT-A-CATH;  Surgeon: Excell Seltzer, MD;  Location: [place];  Service: General;  Laterality: N/A;  . SIMPLE MASTECTOMY WITH AXILLARY SENTINEL NODE BIOPSY Right 09/29/2015   Procedure: RIGHT TOTAL  MASTECTOMY WITH AXILLARY SENTINEL NODE BIOPSY;  Surgeon: Excell Seltzer, MD;  Location: [place];  Service: General;  Laterality: Right;    SOCIAL HISTORY: Social History   Social History  . Marital status: Single    Spouse name: N/A  . Number of children: 1  . Years of education: N/A   Occupational History  . Not on file.   Social History Main Topics  . Smoking status: Never Smoker  . Smokeless tobacco: Never Used  . Alcohol use No  . Drug use: No  .  Sexual activity: Yes   Other Topics Concern  . Not on file   Social History Narrative  . No narrative on file    FAMILY HISTORY: Family History  Problem Relation Age of Onset  . Hypertension Mother   . Hypertension Father   . Prostate cancer Father 72  . Breast cancer Maternal Aunt 82  . Breast cancer Paternal Aunt 8  . Cirrhosis Maternal Uncle   . Prostate cancer Paternal Uncle   . Throat cancer Maternal Aunt        non smoker  . Prostate cancer Paternal Uncle   . Colon cancer Cousin        maternal first cousin dx in his 58s  . Lung cancer Cousin        smoker    ALLERGIES:  has No Known Allergies.  MEDICATIONS:  Current Outpatient Prescriptions  Medication Sig Dispense Refill  . amLODipine (NORVASC) 10 MG tablet Take 1 tablet (10 mg total) by mouth at bedtime. 30 tablet 3  . B Complex-C (SUPER B COMPLEX PO) Take 1 tablet by mouth daily.    Water engineer Bandages & Supports (KNEE COMPRESSION SLEEVE/L/XL) MISC   0  . ferrous sulfate 325 (65 FE) MG tablet Take 325 mg by mouth 2 (two) times daily with a meal.    . ibuprofen (ADVIL,MOTRIN) 200 MG tablet Take 200 mg by mouth every 6 (six) hours as needed.    . Multiple Vitamin (MULTIVITAMIN) tablet Take 1 tablet by mouth daily.    . polyethylene  glycol (MIRALAX / GLYCOLAX) packet Take 17 g by mouth daily as needed for mild constipation. 14 each 0  . senna (SENOKOT) 8.6 MG TABS tablet Take 1 tablet (8.6 mg total) by mouth 2 (two) times daily. (Patient taking differently: Take 1 tablet by mouth daily as needed. ) 120 each 0  . tamoxifen (NOLVADEX) 20 MG tablet TAKE 1 TABLET BY MOUTH ONCE DAILY 30 tablet 2  . valsartan-hydrochlorothiazide (DIOVAN-HCT) 160-12.5 MG tablet Take 1 tablet by mouth every morning.  0  . venlafaxine XR (EFFEXOR XR) 37.5 MG 24 hr capsule Take 1 capsule (37.5 mg total) by mouth daily with breakfast. 30 capsule 1  . diphenhydrAMINE (BENADRYL) 12.5 MG/5ML elixir Take 5 mLs (12.5 mg total) by mouth every 6 (six) hours as needed for itching. (Patient not taking: Reported on 08/30/2016) 120 mL 0  . hydrocortisone 2.5 % cream Apply topically 2 (two) times daily. (Patient not taking: Reported on 07/19/2016) 30 g 1  . LORazepam (ATIVAN) 0.5 MG tablet Take 1 tablet (0.5 mg total) by mouth at bedtime as needed for anxiety (nausea). (Patient not taking: Reported on 07/19/2016) 30 tablet 0  . ondansetron (ZOFRAN-ODT) 4 MG disintegrating tablet Take 4 mg by mouth every 8 (eight) hours as needed for nausea or vomiting. Reported on 11/01/2015  0   No current facility-administered medications for this visit.    Facility-Administered Medications Ordered in Other Visits  Medication Dose Route Frequency Provider Last Rate Last Dose  . 0.9 %  sodium chloride infusion   Intravenous Once Truitt Merle, MD      . alteplase (CATHFLO ACTIVASE) injection 2 mg  2 mg Intracatheter Once PRN Truitt Merle, MD      . heparin lock flush 100 unit/mL  250 Units Intracatheter Once PRN Truitt Merle, MD      . heparin lock flush 100 unit/mL  500 Units Intravenous Once PRN Truitt Merle, MD      . sodium chloride  0.9 % injection 10 mL  10 mL Intracatheter PRN Truitt Merle, MD      . sodium chloride 0.9 % injection 10 mL  10 mL Intravenous PRN Truitt Merle, MD      . sodium  chloride 0.9 % injection 3 mL  3 mL Intravenous Once PRN Truitt Merle, MD      . sodium chloride flush (NS) 0.9 % injection 10 mL  10 mL Intravenous PRN Truitt Merle, MD        REVIEW OF SYSTEMS:   Constitutional: Denies fevers, chills  (+) improved Hot flashes, night sweats (+) fatigue and tiredness  Eyes: Denies blurriness of vision, double vision or watery eyes Ears, nose, mouth, throat, and face: Denies mucositis or sore throat Respiratory: Denies cough, dyspnea or wheezes  Cardiovascular: Denies palpitation, chest discomfort or lower extremity swelling Gastrointestinal:  Denies nausea, heartburn or change in bowel habits Skin: Denies abnormal skin rashes Lymphatics: Denies new lymphadenopathy or easy bruising (+) right arm and right abdominal swelling Neurological:Denies numbness, tingling or new weaknesses (+) hands numb and aching  Behavioral/Psych: Mood is stable, no new changes (+) mood swings  All other systems were reviewed with the patient and are negative.  PHYSICAL EXAMINATION: ECOG PERFORMANCE STATUS: 1   Vitals:   08/30/16 1042  BP: (!) 149/92  Pulse: 79  Resp: 18  Temp: 98.5 F (36.9 C)   Filed Weights   08/30/16 1042  Weight: 158 lb (71.7 kg)     GENERAL:alert, no distress and comfortable SKIN: skin color, texture, turgor are normal, Several scattered acne-like rash in her upper front chest.  EYES: normal, conjunctiva are pink and non-injected, sclera clear OROPHARYNX:no exudate, no erythema and lips, buccal mucosa, and tongue normal  NECK: supple, thyroid normal size, non-tender, without nodularity LYMPH:  no palpable lymphadenopathy in the cervical, axillary or inguinal LUNGS: clear to auscultation and percussion with normal breathing effort HEART: regular rate & rhythm and no murmurs and no lower extremity edema ABDOMEN:abdomen soft, non-tender and normal bowel sounds Musculoskeletal:no cyanosis of digits and no clubbing  PSYCH: alert & oriented x 3 with  fluent speech NEURO: no focal motor/sensory deficits Breasts: Breast inspection showed them to be symmetrical with no skin change or nipple discharge.  Status post right mastectomy and tissue expander placement, surgical scar has healed well. (+) Diffuse skin pigmentation of the right breast secondary to radiation, no skin ulcers or discharge. Palpation of the left breast and bilateral axilla showed no palpable mass.  LABORATORY DATA:  I have reviewed the data as listed CBC Latest Ref Rng & Units 08/30/2016 08/09/2016 07/16/2016  WBC 3.9 - 10.3 10e3/uL 6.4 7.2 7.6  Hemoglobin 11.6 - 15.9 g/dL 10.4(L) 10.8(L) 10.4(L)  Hematocrit 34.8 - 46.6 % 30.9(L) 31.6(L) 30.4(L)  Platelets 145 - 400 10e3/uL 188 240 230    CMP Latest Ref Rng & Units 08/30/2016 08/09/2016 07/16/2016  Glucose 70 - 140 mg/dl 104 100 110  BUN 7.0 - 26.0 mg/dL 11.9 13.8 9.9  Creatinine 0.6 - 1.1 mg/dL 0.8 0.8 0.8  Sodium 136 - 145 mEq/L 141 141 142  Potassium 3.5 - 5.1 mEq/L 3.4(L) 3.6 3.4(L)  Chloride 101 - 111 mmol/L - - -  CO2 22 - 29 mEq/L '25 25 26  '$ Calcium 8.4 - 10.4 mg/dL 9.4 9.4 9.4  Total Protein 6.4 - 8.3 g/dL 7.3 7.4 7.2  Total Bilirubin 0.20 - 1.20 mg/dL 0.38 0.25 0.28  Alkaline Phos 40 - 150 U/L 54 51 52  AST  5 - 34 U/L '22 21 22  '$ ALT 0 - 55 U/L '18 15 16    '$ PATHOLOGY: Diagnosis 09/29/2015 1. Lymph node, sentinel, biopsy, Right axillary #1 - ONE BENIGN LYMPH NODE WITH NO TUMOR SEEN (0/1). 2. Lymph node, sentinel, biopsy, Right axillary #2 - ONE LYMPH NODE POSITIVE FOR METASTATIC MAMMARY CARCINOMA (1/1). 3. Lymph node, sentinel, biopsy, Right axillary #4 - ONE BENIGN LYMPH NODE WITH NO TUMOR SEEN (0/1). 4. 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 5. 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 - LARGEST FOCUS OF INVASIVE MAMMARY CARCINOMA IS GRADE 2,  SHOWS ASSOCIATED LOBULAR CARCINOMA IN SITU AND MEASURES 13 CM IN GREATEST DIMENSION. - SECOND LARGEST FOCUS SHOWS INVASIVE GRADE 3 MAMMARY CARCINOMA AND SPANS 2.1 CM IN GREATEST DIMENSION. - THIRD LARGEST FOCUS SHOWS INVASIVE GRADE 2 MAMMARY CARCINOMA WITH LOBULAR CARCINOMA IN SITU AND MEASURES 1.9 CM IN GREATEST DIMENSION. - SMALLEST FOCUS SHOWS INVASIVE GRADE 3 MAMMARY CARCINOMA AND MEASURES 0.5 CM IN GREATEST DIMENSION. - ANTERIOR INFERIOR SOFT TISSUE MARGIN DEMONSTRATES BROAD POSITIVITY FOR INVASIVE MAMMARY CARCINOMA. - OTHER MARGINS ARE NEGATIVE. - SEE ONCOLOGY TEMPLATE. 6. Lymph node, sentinel, biopsy, Right axillary #3 - ONE BENIGN LYMPH NODE WITH NO TUMOR SEEN (0/1). 7. Lymph node, sentinel, biopsy, Right axillary #6 - ONE LYMPH NODE WITH ISOLATED TUMOR CELLS. 8. Lymph nodes, regional resection, Right axillary contents - ELEVEN BENIGN LYMPH NODES WITH NO TUMOR SEEN (0/11). Specimen, including laterality and lymph node sampling (sentinel, non-sentinel): Right breast with right sentinel and non-sentinel lymph nodes. Procedure: Right modified mastectomy with sentinel lymph node biopsy and axillary dissection. Histologic type: Multifocal mammary carcinoma with ductal and lobular features. Largest focus (13 cm): Grade: 2. Tubule formation: 3. Nuclear pleomorphism: 2. Mitotic: 1. Second largest focus (2.1 cm): Grade: 3. Tubule formation: 3. Nuclear pleomorphism: 2. Mitotic: 3. Third largest focus (1.9 cm): Grade: 2. Tubule formation: 3. Nuclear pleomorphism: 2. Mitotic: 1. Smallest focus (0.5 cm): Grade: 3. Tubule formation: 3. Nuclear pleomorphism: 2. Mitotic: 3. Tumor size (gross measurements): 13 cm, 2.1 cm, 1.9 cm and 0.5 cm. Margins: Invasive, distance to closest margin: Invasive mammary carcinoma broadly involves the anterior inferior soft tissue margin. In-situ, distance to closest margin: Margin uninvolved by in situ carcinoma. If margin positive, focally or  broadly: Broadly positive. Lymphovascular invasion: Yes, identified. Ductal carcinoma in situ: Not identified. Grade: Not applicable. Extensive intraductal component: Not applicable. Lobular neoplasia: Yes, lobular carcinoma in situ is present. Tumor focality: Multifocal. Treatment effect: Not applicable. If present, treatment effect in breast tissue, lymph nodes or both: Not applicable. Extent of tumor: Skin: Not involved. Nipple: Not involved. Skeletal muscle: Not received. Lymph nodes: Examined: 6 Sentinel. 11 Non-sentinel. 17 Total. Lymph nodes with metastasis: 2 with metastasis and 1 with isolated tumor cells. Isolated tumor cells (< 0.2 mm): 1. Micrometastasis: (> 0.2 mm and < 2.0 mm): 0. Macrometastasis: (> 2.0 mm): 2. Extracapsular extension: Not identified.  Breast prognostic profile: Performed on previous case, [id number]. Right 8 o'clock biopsy 12 cm from the nipple: Estrogen receptor: 95%, positive. Progesterone receptor: 95%, positive. Her-2 neu: 2.04 ratio, positive. Ki-67: 30%. Right breast needle core biopsy 6:30 o'clock position 1 cm from the nipple: Estrogen receptor: 90%, positive. Progesterone receptor: 95%, positive. Her-2 neu: 1.39 ratio, negative. Ki-67: 10%. Non-neoplastic breast: No significant non-neoplastic breast findings.  TNM: pT3(m), pN1a. Comments: A cytokeratin AE1/AE3 immunohistochemical stain is performed  on eight blocks containing lymph node tissue (eight stains total). The morphology coupled with the staining pattern is consistent with the above findings. An E-cadherin immunohistochemical stain is also performed on four blocks from the right simple mastectomy specimen. The staining pattern coupled with the morphology is consistent with the above findings. As there was Her-2 neu positivity in one of the biopsies and both biopsies were positive for estrogen receptor, a repeat breast prognostic profile will not be performed unless  otherwise requested. Dr. Lyndon Code has seen selected slides 5E, 14F, 5G, 5J, 5N, 5O, and 5P with agreement that the tumor is a mulitfocal invasive mammary carcinoma with a mixed ductal and lobular phenotype. Dr. Tresa Moore has also seen a selected slide (5P) with agreement of the positive anterior inferior margin. (RH:ecj 10/03/2015)   RADIOGRAPHIC STUDIES: I have personally reviewed the radiological images as listed and agreed with the findings in the report. No new scans  CT chest, abdomen and pelvis  10/27/2015 IMPRESSION: Status post right mastectomy and right axillary lymph node dissection.  No findings specific for metastatic disease.  Scattered hepatic cysts measuring up to 12 mm. A single 8 mm hypoenhancing lesion is technically indeterminate, but also likely benign, although motion degraded. Consider attention on follow-up.  Bone scan 10/28/2015 IMPRESSION: Small focus of increased radiotracer uptake within the right lateral skull at the region of the right temporal bone. CT of the head may be considered for further evaluation.  Otherwise no evidence of osseous metastatic disease in the axial skeleton.  CT head with and without contrast 11/07/2015 IMPRESSION: 1. Indeterminate 10-11 mm area of heterogeneous bone mineralization along the right sphenoid wing which may correspond to the recent bone scan finding. This might be physiologic. A Repeat CT head (e.g. in 8-12 weeks, Noncontrast should suffice but with and without contrast could be done if repeat brain staging is desired) may be most valuable to evaluate stability. 2. No CT evidence of metastatic disease to the brain. Mild nonspecific white matter changes.  Echo 04/10/2016 Impressions:  - Normal LV size with EF 65-70%. Strain as above. Normal RV size   and systolic function. No significant valvular abnormalities.   ASSESSMENT & PLAN:  41 y.o. African-American female, premenopausal, presented with a palpable right  breast mass.  1. 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 Her initial biopsy showed 1 triple positive disease, the other was ER/PR positive and HER2 Negative disease. -Her staging scans were negative for distant metastasis. -We previously discussed that HER-2 positive breast cancer are more aggressive, with a high risk for recurrence after surgery, especially in the setting of locally advanced stage. I recommend her to have adjuvant chemotherapy TCHP (docetaxel, carboplatin, Herceptin, pejeta) every 3 weeks for total of 6 cycles, followed by Herceptin maintenance therapy to complete 1 year treatment. -Pejeta has been recently approved for maintenance adjuvant therapy for high-risk HER-2 positive disease. I have added it to Herceptin as maintenance therapy. She tolerated pejeta well in the adjuvant chemotherapy setting. -Her baseline echo was normal, she will follow-up with cardiologist to monitor her heart during her herceptin therapy. Last repeated echo on 07/09/2016 was normal. -She completed adjuvant radiation therapy by Dr. Sondra Come on 04/27/2016. Tolerated well. -She has started adjuvant  antiestrogen therapy with tamoxifen, tolerating well overall, with moderate hot flashes. She could not tolerate Effexor. -I previously discussed alternative adjuvant endocrine therapy with aromatase inhibitor and ovarian suppression with her also, given her young age and due to her locally advanced disease, I strongly encouraged her to consider this. Potential side effects of overexpression and aromatase inhibitor were discussed with her in details. She is interested. She would like to discuss with her  GYN. We will probably start ovarian suppression with Zoladex in a few months when she is stable on tamoxifen. -We discussed after she complete Herceptin and Perjeta maintenance therapy, she could consider a newly FDA approved oral history and body Neratinib -June 21st is the last Herceptin cycle.   -she is scheduled in June for her Echo  -Labs reviewed and adequate for Herceptin, Perjete treatment today.  -Continue breast cancer surveillance. She is clinically doing well, lab and exam are unremarkable, no clinical concern for recurrence. She is due for mammogram now.  2. Iron deficient anemia, and chemo induced anemia  -Her lab tests showed a moderate anemia. -Her anemia previously resolved after IV Feraheme, slightly worse after she started chemotherapy. -I previously encouraged her to continue oral iron pill -Her repeated iron level and a ferritin have normalized.  -She has required blood transfusion in the past -Hemoglobin 10.5 previously, improved from December 2017, we'll continue monitoring. -Iron lab will be done next time, she has felt more tired and fatigued lately. Will monitor for more sings on anemia.   3. Genetic -Given her young age and positive family history, we strongly recommend her to [facility] genetic counseling for genetic testing to ruled out inheritable breast cancer syndrome. She agrees. -Her genetic testing was normal  4.  HTN -She was noticed to have significant elevated blood pressure lately  -She has previously seen her primary care physician, and her blood pressure medication has been adjusted, including HCTZ  5. Right arm lymphedema  -Secondary to breast surgery and lymph node biopsy -improved  -She is using compression cast for her right arm. -She has a pump from PT and she says it swells more on the pump, will F/u with PT for more help  6. Hot flashes and mood swings. -The Effexor made her feel loopy and she has stopped taking it. -I previously started her on  Neurontin for her nocturnal hot flash and peripheral neuropathy, but she only took it once due to feeling loopy. -Hot flashes improved on Effexor and stopped -Will proceed with taking Effexor for mood swings.   Plan -f/u in 6 weeks  -Lab, flash, herceptin and perjete in 3 and 6 weeks.  -mammogram this month  -will discuss ovarian suppression on next visit   I spent 20 minutes counseling the patient face to face. The total time spent in the appointment was 25 minutes and more than 50% was on counseling.    Truitt Merle, MD 08/30/2016    This document serves as a record of services personally performed by Truitt Merle, MD. It was created on her behalf by Joslyn Devon, a trained medical scribe. The creation of this record is based on the scribe's personal observations and the provider's statements to them. This document has been checked and approved by the attending provider.

## 2016-08-29 ENCOUNTER — Ambulatory Visit: Payer: BLUE CROSS/BLUE SHIELD

## 2016-08-29 DIAGNOSIS — M25511 Pain in right shoulder: Secondary | ICD-10-CM

## 2016-08-29 DIAGNOSIS — G8929 Other chronic pain: Secondary | ICD-10-CM

## 2016-08-29 DIAGNOSIS — I972 Postmastectomy lymphedema syndrome: Secondary | ICD-10-CM

## 2016-08-29 DIAGNOSIS — M25611 Stiffness of right shoulder, not elsewhere classified: Secondary | ICD-10-CM

## 2016-08-29 NOTE — Patient Instructions (Signed)

## 2016-08-29 NOTE — Therapy (Signed)
[place], [place], [address] Phone: [phone]   Fax:  [phone]  Physical Therapy Treatment  Patient Details  Name: Melanie Moss MRN: [id number] Date of Birth: Apr 12, 1976 Referring Provider: Dr. Excell Seltzer   Encounter Date: 08/29/2016      PT End of Session - 08/29/16 1214    Visit Number 12   Number of Visits 13   Date for PT Re-Evaluation 08/31/16   PT Start Time 0935   PT Stop Time 1022   PT Time Calculation (min) 47 min   Activity Tolerance Patient tolerated treatment well   Behavior During Therapy [facility] for tasks assessed/performed      Past Medical History:  Diagnosis Date  . Anemia   . Anxiety   . Breast cancer of lower-outer quadrant of right female breast ([place]) 07/29/2015  . Family history of breast cancer   . Family history of colon cancer   . Family history of prostate cancer   . GERD (gastroesophageal reflux disease)    tums   . Gestational diabetes   . Hyperlipidemia   . Hypertension    not currently on medication  . Preterm labor     Past Surgical History:  Procedure Laterality Date  . AXILLARY LYMPH NODE DISSECTION Right 09/29/2015   Procedure: AXILLARY LYMPH NODE DISSECTION;  Surgeon: Excell Seltzer, MD;  Location: [place];  Service: General;  Laterality: Right;  . BREAST RECONSTRUCTION WITH PLACEMENT OF TISSUE EXPANDER AND FLEX HD (ACELLULAR HYDRATED DERMIS) Right 09/29/2015   Procedure: BREAST RECONSTRUCTION WITH PLACEMENT OF TISSUE EXPANDER AND FLEX HD (ACELLULAR HYDRATED DERMIS);  Surgeon: Loel Lofty Dillingham, DO;  Location: [place];  Service: Plastics;  Laterality: Right;  . CERVICAL CERCLAGE  02/19/2011   Procedure: CERCLAGE CERVICAL;  Surgeon: Agnes Lawrence, MD;  Location: [facility];  Service: Gynecology;  Laterality: N/A;  . DILATION AND CURETTAGE OF UTERUS    . MASTECTOMY WITH AXILLARY LYMPH NODE DISSECTION Right 09/29/2015  . PORTACATH PLACEMENT N/A 09/29/2015   Procedure: INSERTION PORT-A-CATH;  Surgeon: Excell Seltzer, MD;  Location: [place];  Service: General;  Laterality: N/A;  . SIMPLE MASTECTOMY WITH AXILLARY SENTINEL NODE BIOPSY Right 09/29/2015   Procedure: RIGHT TOTAL  MASTECTOMY WITH AXILLARY SENTINEL NODE BIOPSY;  Surgeon: Excell Seltzer, MD;  Location: [place];  Service: General;  Laterality: Right;    There were no vitals filed for this visit.      Subjective Assessment - 08/29/16 0937    Subjective Haven't noticed much improvement in the swelling since Monday but I've only gotten to try doing the anastomosis before and after the pump once since I was here last. I think I'm just going to get measured for my sleeve. And I'd like to come maybe 2 more times next week so we can focus on my Rt shoulder.    Pertinent History June 8th right side mastectomy and full axillary lymph node dissection, has had chemotherapy and radiation pt currently has an expander on R.  wear compression sleeve and gauntlet to try to control lymphedema   Patient Stated Goals to get swelling under control so she can get back to work    Currently in Pain? No/denies               LYMPHEDEMA/ONCOLOGY QUESTIONNAIRE - 08/29/16 0951      Right Upper Extremity Lymphedema   15 cm Proximal to Olecranon Process 29.6 cm   10 cm Proximal to Olecranon Process 30.1 cm  Olecranon Process 26 cm   15 cm Proximal to Ulnar Styloid Process 27.2 cm   10 cm Proximal to Ulnar Styloid Process 24.3 cm   Just Proximal to Ulnar Styloid Process 18.3 cm   Across Hand at [employer] 20.2 cm   At [place] of 2nd Digit 6.3 cm                  [facility] Adult PT Treatment/Exercise - 08/29/16 0001      Shoulder Exercises: Standing   External Rotation Strengthening;Right;10 reps;Theraband   Theraband Level (Shoulder External Rotation) Level 1 (Yellow)   Internal Rotation Strengthening;Right;10 reps;Theraband   Theraband Level (Shoulder Internal Rotation) Level 1 (Yellow)    Flexion Strengthening;Right;10 reps;Theraband   Theraband Level (Shoulder Flexion) Level 1 (Yellow)   Extension Strengthening;Right;10 reps;Theraband   Theraband Level (Shoulder Extension) Level 1 (Yellow)     Shoulder Exercises: Pulleys   Flexion 2 minutes   ABduction 2 minutes   ABduction Limitations VC to decrease side trunk lean     Shoulder Exercises: Therapy Ball   Flexion 5 reps  With tactile cuing for correct UE position   ABduction 5 reps  With VC to widen stance to be able to relax shoulder      Manual Therapy   Edema Management Assisted pt with donning Reduction compression garment after session using Medi BPS guide card for consistent compression  at all straps.   Manual Lymphatic Drainage (MLD) Done briefly today: In Supine: Short neck, superficial and deep abdominals (continued with instruction of diaphragmatic breathing, but pt did show some improvement with this today), Rt inguinal and Lt axillary nodes, Rt axillo-inguinal and anterior inter-axillary anastomosis, and Rt UE from dorsal hand to lateral shoulder working from proximal to distal then retracing all steps.   Passive ROM PROM to left shoulder into flexion and abduction within pain limits, but pt did report pain at end range was some improved today.                        [facility] Goals - 08/10/16 1208      CC Long Term Goal  #1   Title Pt will reduce circumference of right forearm at 10 cm proximal to ulnar styloid to 22 cm    Baseline 24.6cm, 24 cm on 08/10/2016   Time 4   Period Weeks   Status On-going     CC Long Term Goal  #2   Title Pt will reduce circumference of right forearm at 15 cm proximal to ulnar styloid to 26 cm    Baseline 28.5 on eval, 26.3 on 08/10/2016   Time 4   Period Weeks   Status On-going     CC Long Term Goal  #3   Title Pt will demonstrate 150 degrees of shoulder flexion to allow pt to reach items off a high shelf    Baseline 136   Period Weeks   Status  On-going     CC Long Term Goal  #4   Title Pt will demonstrate at least 50 degrees of right shoulder internal  rotation to all her to return to prior level of function   Baseline 30   Time 8   Status On-going     CC Long Term Goal  #5   Title Pt will be independent in a home exercise program for continued strengthening and stretching    Time 4   Period Weeks   Status  On-going            Plan - 08/29/16 1219    Clinical Impression Statement Pts circumference was vsibly reduced today from Mondays visit. She reports when she took her garment off yesterday before showering she could see/palpate tendons on anterior forearm due to swelling being so decreased. Overall she is pleased with results and plans to have her arm measured for a daytime compression sleeve next week. She will call [facility] to make an appointment. Focused more on Rt shoulder strengthening today and progressed her HEP to include [place] exercises which pt tolerated well reporting no pain after but could "feel" the muscles working. Pt to come 2x next week and D/C.   Rehab Potential Good   Clinical Impairments Affecting Rehab Potential  full axillary node dissection, previous chemo and radiation.    PT Frequency 3x / week   PT Duration 4 weeks   PT Treatment/Interventions Manual techniques;Therapeutic exercise;Passive range of motion;Patient/family education;Compression bandaging;Vasopneumatic Device;ADLs/Self Care Home Management;DME Instruction;Therapeutic activities;Orthotic Fit/Training;Manual lymph drainage;Scar mobilization   PT Next Visit Plan Manual lymph drainage prn; cont focusing on AA/ROM for Rt shoulder and add low rows for scap retraction.    Consulted and Agree with Plan of Care Patient      Patient will benefit from skilled therapeutic intervention in order to improve the following deficits and impairments:  Decreased range of motion, Pain, Impaired UE functional use, Increased fascial restricitons,  Decreased strength, Decreased knowledge of use of DME, Decreased knowledge of precautions, Decreased scar mobility, Increased edema  Visit Diagnosis: Postmastectomy lymphedema  Chronic right shoulder pain  Stiffness of right shoulder, not elsewhere classified     Problem List Patient Active Problem List   Diagnosis Date Noted  . Genetic testing 08/16/2015  . Iron deficiency anemia due to chronic blood loss 08/03/2015  . Family history of breast cancer   . Family history of colon cancer   . Family history of prostate cancer   . Breast cancer of lower-outer quadrant of right female breast ([place]) 07/29/2015  . Leiomyoma of uterus, unspecified 08/01/2012  . Irregular menstrual cycle 08/01/2012  . 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  . HTN (hypertension) 12/22/2010  . Anemia 12/22/2010  . Acid reflux 12/22/2010    Otelia Limes, PTA 08/29/2016, 12:34 PM  [place], [place], [address] Phone: [phone]   Fax:  [phone]  Name: Melanie Moss MRN: [id number] Date of Birth: 1975-05-16

## 2016-08-30 ENCOUNTER — Other Ambulatory Visit (HOSPITAL_BASED_OUTPATIENT_CLINIC_OR_DEPARTMENT_OTHER): Payer: BLUE CROSS/BLUE SHIELD

## 2016-08-30 ENCOUNTER — Ambulatory Visit (HOSPITAL_BASED_OUTPATIENT_CLINIC_OR_DEPARTMENT_OTHER): Payer: BLUE CROSS/BLUE SHIELD

## 2016-08-30 ENCOUNTER — Telehealth: Payer: Self-pay | Admitting: Hematology

## 2016-08-30 ENCOUNTER — Encounter: Payer: Self-pay | Admitting: Hematology

## 2016-08-30 ENCOUNTER — Ambulatory Visit (HOSPITAL_BASED_OUTPATIENT_CLINIC_OR_DEPARTMENT_OTHER): Payer: BLUE CROSS/BLUE SHIELD | Admitting: Hematology

## 2016-08-30 VITALS — BP 149/92 | HR 79 | Temp 98.5°F | Resp 18 | Ht 63.0 in | Wt 158.0 lb

## 2016-08-30 DIAGNOSIS — D6481 Anemia due to antineoplastic chemotherapy: Secondary | ICD-10-CM | POA: Diagnosis not present

## 2016-08-30 DIAGNOSIS — C50511 Malignant neoplasm of lower-outer quadrant of right female breast: Secondary | ICD-10-CM

## 2016-08-30 DIAGNOSIS — Z170 Estrogen receptor positive status [ER+]: Principal | ICD-10-CM

## 2016-08-30 DIAGNOSIS — Z5112 Encounter for antineoplastic immunotherapy: Secondary | ICD-10-CM

## 2016-08-30 DIAGNOSIS — D500 Iron deficiency anemia secondary to blood loss (chronic): Secondary | ICD-10-CM

## 2016-08-30 DIAGNOSIS — I10 Essential (primary) hypertension: Secondary | ICD-10-CM

## 2016-08-30 LAB — COMPREHENSIVE METABOLIC PANEL
ALT: 18 U/L (ref 0–55)
AST: 22 U/L (ref 5–34)
Albumin: 3.9 g/dL (ref 3.5–5.0)
Alkaline Phosphatase: 54 U/L (ref 40–150)
Anion Gap: 9 mEq/L (ref 3–11)
BUN: 11.9 mg/dL (ref 7.0–26.0)
CO2: 25 mEq/L (ref 22–29)
Calcium: 9.4 mg/dL (ref 8.4–10.4)
Chloride: 108 mEq/L (ref 98–109)
Creatinine: 0.8 mg/dL (ref 0.6–1.1)
EGFR: >90 mL/min/{1.73_m2} (ref >90)
Glucose: 104 mg/dl (ref 70–140)
Potassium: 3.4 mEq/L — ABNORMAL LOW (ref 3.5–5.1)
Sodium: 141 mEq/L (ref 136–145)
Total Bilirubin: 0.38 mg/dL (ref 0.20–1.20)
Total Protein: 7.3 g/dL (ref 6.4–8.3)

## 2016-08-30 LAB — CBC WITH DIFFERENTIAL/PLATELET
BASO%: 0.3 % (ref 0.0–2.0)
Basophils Absolute: 0 10*3/uL (ref 0.0–0.1)
EOS%: 1.1 % (ref 0.0–7.0)
Eosinophils Absolute: 0.1 10*3/uL (ref 0.0–0.5)
HCT: 30.9 % — ABNORMAL LOW (ref 34.8–46.6)
HGB: 10.4 g/dL — ABNORMAL LOW (ref 11.6–15.9)
LYMPH%: 35.4 % (ref 14.0–49.7)
MCH: 31.1 pg (ref 25.1–34.0)
MCHC: 33.7 g/dL (ref 31.5–36.0)
MCV: 92.5 fL (ref 79.5–101.0)
MONO#: 0.4 10*3/uL (ref 0.1–0.9)
MONO%: 6.4 % (ref 0.0–14.0)
NEUT#: 3.7 10*3/uL (ref 1.5–6.5)
NEUT%: 56.8 % (ref 38.4–76.8)
Platelets: 188 10*3/uL (ref 145–400)
RBC: 3.34 10*6/uL — ABNORMAL LOW (ref 3.70–5.45)
RDW: 13.3 % (ref 11.2–14.5)
WBC: 6.4 10*3/uL (ref 3.9–10.3)
lymph#: 2.3 10*3/uL (ref 0.9–3.3)

## 2016-08-30 LAB — FERRITIN: Ferritin: 259 ng/ml (ref 9–269)

## 2016-08-30 LAB — IRON AND TIBC
%SAT: 29 % (ref 21–57)
Iron: 63 ug/dL (ref 41–142)
TIBC: 219 ug/dL — ABNORMAL LOW (ref 236–444)
UIBC: 156 ug/dL (ref 120–384)

## 2016-08-30 MED ORDER — SODIUM CHLORIDE 0.9 % IJ SOLN
10.0000 mL | INTRAMUSCULAR | Status: DC | PRN
  Administered 2016-08-30: 10 mL via INTRAVENOUS
  Filled 2016-08-30: qty 10

## 2016-08-30 MED ORDER — SODIUM CHLORIDE 0.9 % IV SOLN
420.0000 mg | Freq: Once | INTRAVENOUS | Status: AC
  Administered 2016-08-30: 420 mg via INTRAVENOUS
  Filled 2016-08-30: qty 14

## 2016-08-30 MED ORDER — DIPHENHYDRAMINE HCL 25 MG PO CAPS
ORAL_CAPSULE | ORAL | Status: AC
  Filled 2016-08-30: qty 2

## 2016-08-30 MED ORDER — HEPARIN SOD (PORK) LOCK FLUSH 100 UNIT/ML IV SOLN
500.0000 [IU] | Freq: Once | INTRAVENOUS | Status: AC | PRN
  Administered 2016-08-30: 500 [IU]
  Filled 2016-08-30: qty 5

## 2016-08-30 MED ORDER — ACETAMINOPHEN 325 MG PO TABS
ORAL_TABLET | ORAL | Status: AC
  Filled 2016-08-30: qty 2

## 2016-08-30 MED ORDER — SODIUM CHLORIDE 0.9% FLUSH
10.0000 mL | INTRAVENOUS | Status: DC | PRN
  Administered 2016-08-30: 10 mL
  Filled 2016-08-30: qty 10

## 2016-08-30 MED ORDER — DIPHENHYDRAMINE HCL 25 MG PO CAPS
50.0000 mg | ORAL_CAPSULE | Freq: Once | ORAL | Status: AC
  Administered 2016-08-30: 25 mg via ORAL

## 2016-08-30 MED ORDER — SODIUM CHLORIDE 0.9 % IV SOLN
Freq: Once | INTRAVENOUS | Status: AC
  Administered 2016-08-30: 12:00:00 via INTRAVENOUS

## 2016-08-30 MED ORDER — ACETAMINOPHEN 325 MG PO TABS
650.0000 mg | ORAL_TABLET | Freq: Once | ORAL | Status: AC
  Administered 2016-08-30: 650 mg via ORAL

## 2016-08-30 MED ORDER — TRASTUZUMAB CHEMO 150 MG IV SOLR
6.0000 mg/kg | Freq: Once | INTRAVENOUS | Status: AC
  Administered 2016-08-30: 420 mg via INTRAVENOUS
  Filled 2016-08-30: qty 20

## 2016-08-30 NOTE — Patient Instructions (Signed)

## 2016-08-30 NOTE — Patient Instructions (Addendum)
[facility] Discharge Instructions for Patients Receiving Chemotherapy  Today you received the following chemotherapy agents trastuzumab (Herceptin) and pertuzumab (Perjeta).  To help prevent nausea and vomiting after your treatment, we encourage you to take your nausea medication as directed.    If you develop nausea and vomiting that is not controlled by your nausea medication, call the clinic.   BELOW ARE SYMPTOMS THAT SHOULD BE REPORTED IMMEDIATELY:  *FEVER GREATER THAN 100.5 F  *CHILLS WITH OR WITHOUT FEVER  NAUSEA AND VOMITING THAT IS NOT CONTROLLED WITH YOUR NAUSEA MEDICATION  *UNUSUAL SHORTNESS OF BREATH  *UNUSUAL BRUISING OR BLEEDING  TENDERNESS IN MOUTH AND THROAT WITH OR WITHOUT PRESENCE OF ULCERS  *URINARY PROBLEMS  *BOWEL PROBLEMS  UNUSUAL RASH Items with * indicate a potential emergency and should be followed up as soon as possible.  Feel free to call the clinic you have any questions or concerns. The clinic phone number is [phone].  Please show the [facility] at check-in to the Emergency Department and triage nurse.

## 2016-08-30 NOTE — Progress Notes (Signed)
Pt refused to stay 34min post infusion for perjeta observation. Pt asymptomatic upon d/c.

## 2016-08-30 NOTE — Telephone Encounter (Signed)
Appointments scheduled per 5.10.18 LOS. Patient given AVS report and calendars with future scheduled appointments. °

## 2016-08-31 ENCOUNTER — Encounter: Payer: Self-pay | Admitting: Hematology

## 2016-08-31 NOTE — Addendum Note (Signed)
Addended by: Truitt Merle on: 08/31/2016 11:17 PM   Modules accepted: Orders

## 2016-09-03 ENCOUNTER — Ambulatory Visit: Payer: BLUE CROSS/BLUE SHIELD | Admitting: Physical Therapy

## 2016-09-03 ENCOUNTER — Encounter: Payer: Self-pay | Admitting: Physical Therapy

## 2016-09-03 DIAGNOSIS — M25611 Stiffness of right shoulder, not elsewhere classified: Secondary | ICD-10-CM

## 2016-09-03 DIAGNOSIS — G8929 Other chronic pain: Secondary | ICD-10-CM

## 2016-09-03 DIAGNOSIS — I972 Postmastectomy lymphedema syndrome: Secondary | ICD-10-CM

## 2016-09-03 DIAGNOSIS — M25511 Pain in right shoulder: Secondary | ICD-10-CM

## 2016-09-03 NOTE — Therapy (Signed)
[place], [place], [address] Phone: [phone]   Fax:  [phone]  Physical Therapy Treatment  Patient Details  Name: Melanie Moss MRN: [id number] Date of Birth: 15-Jun-1975 Referring Provider: Dr. Excell Seltzer   Encounter Date: 09/03/2016      PT End of Session - 09/03/16 1712    Visit Number 13   Number of Visits 21   Date for PT Re-Evaluation 10/01/16   PT Start Time 6073   PT Stop Time 1345   PT Time Calculation (min) 42 min   Activity Tolerance Patient tolerated treatment well   Behavior During Therapy [facility] for tasks assessed/performed      Past Medical History:  Diagnosis Date  . Anemia   . Anxiety   . Breast cancer of lower-outer quadrant of right female breast ([place]) 07/29/2015  . Family history of breast cancer   . Family history of colon cancer   . Family history of prostate cancer   . GERD (gastroesophageal reflux disease)    tums   . Gestational diabetes   . Hyperlipidemia   . Hypertension    not currently on medication  . Preterm labor     Past Surgical History:  Procedure Laterality Date  . AXILLARY LYMPH NODE DISSECTION Right 09/29/2015   Procedure: AXILLARY LYMPH NODE DISSECTION;  Surgeon: Excell Seltzer, MD;  Location: [place];  Service: General;  Laterality: Right;  . BREAST RECONSTRUCTION WITH PLACEMENT OF TISSUE EXPANDER AND FLEX HD (ACELLULAR HYDRATED DERMIS) Right 09/29/2015   Procedure: BREAST RECONSTRUCTION WITH PLACEMENT OF TISSUE EXPANDER AND FLEX HD (ACELLULAR HYDRATED DERMIS);  Surgeon: Loel Lofty Dillingham, DO;  Location: [place];  Service: Plastics;  Laterality: Right;  . CERVICAL CERCLAGE  02/19/2011   Procedure: CERCLAGE CERVICAL;  Surgeon: Agnes Lawrence, MD;  Location: [facility];  Service: Gynecology;  Laterality: N/A;  . DILATION AND CURETTAGE OF UTERUS    . MASTECTOMY WITH AXILLARY LYMPH NODE DISSECTION Right 09/29/2015  . PORTACATH PLACEMENT N/A 09/29/2015   Procedure: INSERTION PORT-A-CATH;  Surgeon: Excell Seltzer, MD;  Location: [place];  Service: General;  Laterality: N/A;  . SIMPLE MASTECTOMY WITH AXILLARY SENTINEL NODE BIOPSY Right 09/29/2015   Procedure: RIGHT TOTAL  MASTECTOMY WITH AXILLARY SENTINEL NODE BIOPSY;  Surgeon: Excell Seltzer, MD;  Location: [place];  Service: General;  Laterality: Right;    There were no vitals filed for this visit.      Subjective Assessment - 09/03/16 1305    Subjective Pt plans on making an appointment with [facility] this week.  She wants to focus on her right shoulder ROM.    Pertinent History June 8th right side mastectomy and full axillary lymph node dissection, has had chemotherapy and radiation pt currently has an expander on R.  wear compression sleeve and gauntlet to try to control lymphedema   Patient Stated Goals to get swelling under control so she can get back to work    Currently in Pain? Yes   Pain Score 4    Pain Location Axilla   Pain Orientation Right   Pain Descriptors / Indicators Sore   Pain Type Chronic pain   Pain Onset More than a month ago            [facility] PT Assessment - 09/03/16 0001      AROM   Right Shoulder Flexion 149 Degrees   Right Shoulder Internal Rotation 60 Degrees  [facility] Adult PT Treatment/Exercise - 09/03/16 0001      Shoulder Exercises: Standing   External Rotation Strengthening;Right;10 reps;Theraband   Theraband Level (Shoulder External Rotation) Level 1 (Yellow)   Internal Rotation Strengthening;Right;10 reps;Theraband   Theraband Level (Shoulder Internal Rotation) Level 1 (Yellow)   Flexion Strengthening;Right;10 reps;Theraband   Theraband Level (Shoulder Flexion) Level 1 (Yellow)   Extension Strengthening;Right;10 reps;Theraband   Theraband Level (Shoulder Extension) Level 1 (Yellow)     Shoulder Exercises: Pulleys   Flexion 2 minutes   ABduction 2 minutes     Shoulder Exercises: Therapy Ball   Flexion  10 reps   ABduction 10 reps     Manual Therapy   Passive ROM PROM to right shoulder into flexion and abduction, pt did have some pain at end range                        [facility] Goals - 09/03/16 1341      CC Long Term Goal  #1   Title Pt will reduce circumference of right forearm at 10 cm proximal to ulnar styloid to 22 cm    Baseline 24.6cm, 24 cm on 08/10/2016   Time 4   Period Weeks   Status On-going     CC Long Term Goal  #2   Title Pt will reduce circumference of right forearm at 15 cm proximal to ulnar styloid to 26 cm    Baseline 28.5 on eval, 26.3 on 08/10/2016   Time 4   Period Weeks   Status On-going     CC Long Term Goal  #3   Title Pt will demonstrate 150 degrees of shoulder flexion to allow pt to reach items off a high shelf    Baseline 136, 09/03/16-149 degrees   Time 4   Period Weeks   Status On-going     CC Long Term Goal  #4   Title Pt will demonstrate at least 50 degrees of right shoulder internal  rotation to all her to return to prior level of function   Baseline 30, 09/03/16- 60 degrees   Time 8   Period Weeks   Status Achieved     CC Long Term Goal  #5   Title Pt will be independent in a home exercise program for continued strengthening and stretching    Time 4   Period Weeks   Status On-going     CC Long Term Goal  #6   Title Pt will improve [place] impairment percentage to less than or equal to 20 percent for improved functional use of RUE   Baseline 59%, 11/23/15- 6.82% impairment   Time 8   Period Weeks   Status Achieved     CC Long Term Goal  #7   Title Pt to demonstrate a 50% decrease in right axillary tightness to allow improved range of motion and improved comfort.    Time 4   Period Weeks   Status New     Additional Goals   Additional Goals Yes            Plan - 09/03/16 1713    Clinical Impression Statement Recert performed today as pt still is having considerable tightness in right axilla. New  goal was added to address this tightness. Pt has been wearing her velcro compression garment and plans on going this week to get measured for a compression sleeve. Today emphasis was placed on right shoulder stretching and strengthening to  decrease right axillary tightness. Pt would benefit from continued skilled PT services to decrease right axillary tightness and to assess pt's compression garments for fit once it arrives. Will decrease to 2x/wk for 4 wks.    Rehab Potential Good   Clinical Impairments Affecting Rehab Potential  full axillary node dissection, previous chemo and radiation.    PT Frequency 2x / week   PT Duration 4 weeks   PT Treatment/Interventions Manual techniques;Therapeutic exercise;Passive range of motion;Patient/family education;Compression bandaging;Vasopneumatic Device;ADLs/Self Care Home Management;DME Instruction;Therapeutic activities;Orthotic Fit/Training;Manual lymph drainage;Scar mobilization   PT Next Visit Plan  cont focusing on AA/ROM for Rt shoulder and add low rows for scap retraction, ask if she was measured for compression sleeve   PT Home Exercise Plan Use Medi compression gauge each time she dons garment.   Consulted and Agree with Plan of Care Patient      Patient will benefit from skilled therapeutic intervention in order to improve the following deficits and impairments:  Decreased range of motion, Pain, Impaired UE functional use, Increased fascial restricitons, Decreased strength, Decreased knowledge of use of DME, Decreased knowledge of precautions, Decreased scar mobility, Increased edema  Visit Diagnosis: Postmastectomy lymphedema - Plan: PT plan of care cert/re-cert  Stiffness of right shoulder, not elsewhere classified - Plan: PT plan of care cert/re-cert  Chronic right shoulder pain - Plan: PT plan of care cert/re-cert     Problem List Patient Active Problem List   Diagnosis Date Noted  . Genetic testing 08/16/2015  . Iron deficiency  anemia due to chronic blood loss 08/03/2015  . Family history of breast cancer   . Family history of colon cancer   . Family history of prostate cancer   . Breast cancer of lower-outer quadrant of right female breast ([place]) 07/29/2015  . Leiomyoma of uterus, unspecified 08/01/2012  . Irregular menstrual cycle 08/01/2012  . 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  . HTN (hypertension) 12/22/2010  . Anemia 12/22/2010  . Acid reflux 12/22/2010    Allyson Sabal [facility] 09/03/2016, 5:19 PM  [place], [place], [address] Phone: [phone]   Fax:  [phone]  Name: KYNDEL EGGER MRN: [id number] Date of Birth: 1975/08/30  Manus Gunning, PT 09/03/16 5:19 PM

## 2016-09-05 ENCOUNTER — Ambulatory Visit: Payer: BLUE CROSS/BLUE SHIELD

## 2016-09-05 DIAGNOSIS — G8929 Other chronic pain: Secondary | ICD-10-CM

## 2016-09-05 DIAGNOSIS — I972 Postmastectomy lymphedema syndrome: Secondary | ICD-10-CM

## 2016-09-05 DIAGNOSIS — M25611 Stiffness of right shoulder, not elsewhere classified: Secondary | ICD-10-CM

## 2016-09-05 DIAGNOSIS — M25511 Pain in right shoulder: Secondary | ICD-10-CM

## 2016-09-05 NOTE — Therapy (Signed)
[place], [place], [address] Phone: [phone]   Fax:  [phone]  Physical Therapy Treatment  Patient Details  Name: Melanie Moss MRN: [id number] Date of Birth: [birth year] Referring Provider: Dr. Excell Seltzer   Encounter Date: 09/05/2016      PT End of Session - 09/05/16 1223    Visit Number 14   Number of Visits 21   Date for PT Re-Evaluation 10/01/16   PT Start Time 0934   PT Stop Time 1017   PT Time Calculation (min) 43 min   Activity Tolerance Patient tolerated treatment well   Behavior During Therapy [facility] for tasks assessed/performed      Past Medical History:  Diagnosis Date  . Anemia   . Anxiety   . Breast cancer of lower-outer quadrant of right female breast ([place]) 07/29/2015  . Family history of breast cancer   . Family history of colon cancer   . Family history of prostate cancer   . GERD (gastroesophageal reflux disease)    tums   . Gestational diabetes   . Hyperlipidemia   . Hypertension    not currently on medication  . Preterm labor     Past Surgical History:  Procedure Laterality Date  . AXILLARY LYMPH NODE DISSECTION Right 09/29/2015   Procedure: AXILLARY LYMPH NODE DISSECTION;  Surgeon: Excell Seltzer, MD;  Location: [place];  Service: General;  Laterality: Right;  . BREAST RECONSTRUCTION WITH PLACEMENT OF TISSUE EXPANDER AND FLEX HD (ACELLULAR HYDRATED DERMIS) Right 09/29/2015   Procedure: BREAST RECONSTRUCTION WITH PLACEMENT OF TISSUE EXPANDER AND FLEX HD (ACELLULAR HYDRATED DERMIS);  Surgeon: Loel Lofty Dillingham, DO;  Location: [place];  Service: Plastics;  Laterality: Right;  . CERVICAL CERCLAGE  02/19/2011   Procedure: CERCLAGE CERVICAL;  Surgeon: Agnes Lawrence, MD;  Location: [facility];  Service: Gynecology;  Laterality: N/A;  . DILATION AND CURETTAGE OF UTERUS    . MASTECTOMY WITH AXILLARY LYMPH NODE DISSECTION Right 09/29/2015  . PORTACATH PLACEMENT N/A 09/29/2015   Procedure: INSERTION PORT-A-CATH;  Surgeon: Excell Seltzer, MD;  Location: [place];  Service: General;  Laterality: N/A;  . SIMPLE MASTECTOMY WITH AXILLARY SENTINEL NODE BIOPSY Right 09/29/2015   Procedure: RIGHT TOTAL  MASTECTOMY WITH AXILLARY SENTINEL NODE BIOPSY;  Surgeon: Excell Seltzer, MD;  Location: [place];  Service: General;  Laterality: Right;    There were no vitals filed for this visit.      Subjective Assessment - 09/05/16 0938    Subjective I haven't called [facility] yet but plan to do that soon for my sleeve/glove. My Rt arm feels less heavy now than it used too, I can tell a big difference.    Pertinent History June 8th right side mastectomy and full axillary lymph node dissection, has had chemotherapy and radiation pt currently has an expander on R.  wear compression sleeve and gauntlet to try to control lymphedema   Patient Stated Goals to get swelling under control so she can get back to work    Currently in Pain? No/denies                         [facility] Adult PT Treatment/Exercise - 09/05/16 0001      Shoulder Exercises: Pulleys   Flexion 2 minutes   Flexion Limitations VC to remind pt to relax into end range of stretch   ABduction 2 minutes     Shoulder Exercises: Therapy Diona Foley  Flexion 5 reps  With forward lean into end of strech     Manual Therapy   Manual Therapy Myofascial release;Passive ROM   Myofascial Release To Rt axilla during P/ROM   Passive ROM PROM to right shoulder into flexion and abduction, pt did have some pain at end range                        [facility] Goals - 09/03/16 1341      CC Long Term Goal  #1   Title Pt will reduce circumference of right forearm at 10 cm proximal to ulnar styloid to 22 cm    Baseline 24.6cm, 24 cm on 08/10/2016   Time 4   Period Weeks   Status On-going     CC Long Term Goal  #2   Title Pt will reduce circumference of right forearm at 15 cm proximal to ulnar  styloid to 26 cm    Baseline 28.5 on eval, 26.3 on 08/10/2016   Time 4   Period Weeks   Status On-going     CC Long Term Goal  #3   Title Pt will demonstrate 150 degrees of shoulder flexion to allow pt to reach items off a high shelf    Baseline 136, 09/03/16-149 degrees   Time 4   Period Weeks   Status On-going     CC Long Term Goal  #4   Title Pt will demonstrate at least 50 degrees of right shoulder internal  rotation to all her to return to prior level of function   Baseline 30, 09/03/16- 60 degrees   Time 8   Period Weeks   Status Achieved     CC Long Term Goal  #5   Title Pt will be independent in a home exercise program for continued strengthening and stretching    Time 4   Period Weeks   Status On-going     CC Long Term Goal  #6   Title Pt will improve Quick Dash impairment percentage to less than or equal to 20 percent for improved functional use of RUE   Baseline 59%, 11/23/15- 6.82% impairment   Time 8   Period Weeks   Status Achieved     CC Long Term Goal  #7   Title Pt to demonstrate a 50% decrease in right axillary tightness to allow improved range of motion and improved comfort.    Time 4   Period Weeks   Status New     Additional Goals   Additional Goals Yes            Plan - 09/05/16 1225    Clinical Impression Statement Pt tolerated stretching well today and was able to attain slight increases with end range with P/ROM today. Really focused on axillary tightness to incision with myofascial release here and pt reported feeling benefit of this after session.   Rehab Potential Good   Clinical Impairments Affecting Rehab Potential  full axillary node dissection, previous chemo and radiation.    PT Frequency 2x / week   PT Duration 4 weeks   PT Treatment/Interventions Manual techniques;Therapeutic exercise;Passive range of motion;Patient/family education;Compression bandaging;Vasopneumatic Device;ADLs/Self Care Home Management;DME Instruction;Therapeutic  activities;Orthotic Fit/Training;Manual lymph drainage;Scar mobilization   PT Next Visit Plan Cont focusing on AA/ROM for Rt shoulder and add low rows for scap retraction, ask pt if she still has copy of order or needs a new one?? Remind pt to call and make appt  to get measured for compression sleeve and glove ([employer]??).   Consulted and Agree with Plan of Care Patient      Patient will benefit from skilled therapeutic intervention in order to improve the following deficits and impairments:  Decreased range of motion, Pain, Impaired UE functional use, Increased fascial restricitons, Decreased strength, Decreased knowledge of use of DME, Decreased knowledge of precautions, Decreased scar mobility, Increased edema  Visit Diagnosis: Postmastectomy lymphedema  Stiffness of right shoulder, not elsewhere classified  Chronic right shoulder pain     Problem List Patient Active Problem List   Diagnosis Date Noted  . Genetic testing 08/16/2015  . Iron deficiency anemia due to chronic blood loss 08/03/2015  . Family history of breast cancer   . Family history of colon cancer   . Family history of prostate cancer   . Breast cancer of lower-outer quadrant of right female breast ([place]) 07/29/2015  . Leiomyoma of uterus, unspecified 08/01/2012  . Irregular menstrual cycle 08/01/2012  . 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  . HTN (hypertension) 12/22/2010  . Anemia 12/22/2010  . Acid reflux 12/22/2010    Otelia Limes, PTA 09/05/2016, 12:39 PM  [place], [place], [address] Phone: [phone]   Fax:  [phone]  Name: Melanie Moss MRN: [id number] Date of Birth: 28-Apr-1975

## 2016-09-10 ENCOUNTER — Ambulatory Visit: Payer: BLUE CROSS/BLUE SHIELD

## 2016-09-10 DIAGNOSIS — G8929 Other chronic pain: Secondary | ICD-10-CM

## 2016-09-10 DIAGNOSIS — M25511 Pain in right shoulder: Secondary | ICD-10-CM

## 2016-09-10 DIAGNOSIS — I972 Postmastectomy lymphedema syndrome: Secondary | ICD-10-CM

## 2016-09-10 DIAGNOSIS — M25611 Stiffness of right shoulder, not elsewhere classified: Secondary | ICD-10-CM

## 2016-09-10 NOTE — Therapy (Signed)
[place], [place], [address] Phone: [phone]   Fax:  [phone]  Physical Therapy Treatment  Patient Details  Name: Melanie Moss MRN: [id number] Date of Birth: Oct 04, 1975 Referring Provider: Dr. Excell Seltzer   Encounter Date: 09/10/2016      PT End of Session - 09/10/16 1147    Visit Number 15   Number of Visits 21   Date for PT Re-Evaluation 10/01/16   PT Start Time 1105   PT Stop Time 1155   PT Time Calculation (min) 50 min   Activity Tolerance Patient tolerated treatment well   Behavior During Therapy [facility] for tasks assessed/performed      Past Medical History:  Diagnosis Date  . Anemia   . Anxiety   . Breast cancer of lower-outer quadrant of right female breast ([place]) 07/29/2015  . Family history of breast cancer   . Family history of colon cancer   . Family history of prostate cancer   . GERD (gastroesophageal reflux disease)    tums   . Gestational diabetes   . Hyperlipidemia   . Hypertension    not currently on medication  . Preterm labor     Past Surgical History:  Procedure Laterality Date  . AXILLARY LYMPH NODE DISSECTION Right 09/29/2015   Procedure: AXILLARY LYMPH NODE DISSECTION;  Surgeon: Excell Seltzer, MD;  Location: [place];  Service: General;  Laterality: Right;  . BREAST RECONSTRUCTION WITH PLACEMENT OF TISSUE EXPANDER AND FLEX HD (ACELLULAR HYDRATED DERMIS) Right 09/29/2015   Procedure: BREAST RECONSTRUCTION WITH PLACEMENT OF TISSUE EXPANDER AND FLEX HD (ACELLULAR HYDRATED DERMIS);  Surgeon: Loel Lofty Dillingham, DO;  Location: [place];  Service: Plastics;  Laterality: Right;  . CERVICAL CERCLAGE  02/19/2011   Procedure: CERCLAGE CERVICAL;  Surgeon: Agnes Lawrence, MD;  Location: [facility];  Service: Gynecology;  Laterality: N/A;  . DILATION AND CURETTAGE OF UTERUS    . MASTECTOMY WITH AXILLARY LYMPH NODE DISSECTION Right 09/29/2015  . PORTACATH PLACEMENT N/A 09/29/2015    Procedure: INSERTION PORT-A-CATH;  Surgeon: Excell Seltzer, MD;  Location: [place];  Service: General;  Laterality: N/A;  . SIMPLE MASTECTOMY WITH AXILLARY SENTINEL NODE BIOPSY Right 09/29/2015   Procedure: RIGHT TOTAL  MASTECTOMY WITH AXILLARY SENTINEL NODE BIOPSY;  Surgeon: Excell Seltzer, MD;  Location: [place];  Service: General;  Laterality: Right;    There were no vitals filed for this visit.      Subjective Assessment - 09/10/16 1111    Subjective I got my new compression sleeve and gauntelt. I feel like the sleeve is a great fit byt the gauntelt is too big and I wanted a glove. So I'm going to call about that.    Pertinent History June 8th right side mastectomy and full axillary lymph node dissection, has had chemotherapy and radiation pt currently has an expander on R.  wear compression sleeve and gauntlet to try to control lymphedema   Patient Stated Goals to get swelling under control so she can get back to work    Currently in Pain? No/denies            [facility] PT Assessment - 09/10/16 0001      AROM   Right Shoulder Flexion 151 Degrees   Right Shoulder ABduction 162 Degrees   Right Shoulder Internal Rotation 63 Degrees                     [facility] Adult PT Treatment/Exercise - 09/10/16  0001      Shoulder Exercises: Pulleys   Flexion 2 minutes   ABduction 2 minutes     Shoulder Exercises: Therapy Ball   Flexion 10 reps  Woth forward lean into end of stretch   ABduction 10 reps  Rt UE with side lean into end of stretch     Manual Therapy   Manual Therapy Myofascial release;Passive ROM   Manual therapy comments Assessed fit of new compression sleeve and gauntelt. Sleeve is a good fiut but gauntelt is too big and pt does need glove so pt plans to call them but therapist will follow up with a call as well.    Myofascial Release To Rt axilla during P/ROM   Passive ROM PROM to right shoulder into flexion and abduction, pt did have some pain at end range                 PT Education - 09/10/16 1200    Education provided Yes   Education Details Showed pt how to perform myofascial release in doorway with Rt UE in flexion stretch and to then perform myofascial to Rt axilla area   Person(s) Educated Patient   Methods Explanation;Demonstration   Comprehension Verbalized understanding                [place] - 09/03/16 1341      CC Long Term Goal  #1   Title Pt will reduce circumference of right forearm at 10 cm proximal to ulnar styloid to 22 cm    Baseline 24.6cm, 24 cm on 08/10/2016   Time 4   Period Weeks   Status On-going     CC Long Term Goal  #2   Title Pt will reduce circumference of right forearm at 15 cm proximal to ulnar styloid to 26 cm    Baseline 28.5 on eval, 26.3 on 08/10/2016   Time 4   Period Weeks   Status On-going     CC Long Term Goal  #3   Title Pt will demonstrate 150 degrees of shoulder flexion to allow pt to reach items off a high shelf    Baseline 136, 09/03/16-149 degrees   Time 4   Period Weeks   Status On-going     CC Long Term Goal  #4   Title Pt will demonstrate at least 50 degrees of right shoulder internal  rotation to all her to return to prior level of function   Baseline 30, 09/03/16- 60 degrees   Time 8   Period Weeks   Status Achieved     CC Long Term Goal  #5   Title Pt will be independent in a home exercise program for continued strengthening and stretching    Time 4   Period Weeks   Status On-going     CC Long Term Goal  #6   Title Pt will improve Quick Dash impairment percentage to less than or equal to 20 percent for improved functional use of RUE   Baseline 59%, 11/23/15- 6.82% impairment   Time 8   Period Weeks   Status Achieved     CC Long Term Goal  #7   Title Pt to demonstrate a 50% decrease in right axillary tightness to allow improved range of motion and improved comfort.    Time 4   Period Weeks   Status New     Additional Goals    Additional Goals Yes  Plan - 09/10/16 1148    Clinical Impression Statement Pt has received new compression sleeve and gauntlet and is now wearing her Circaid reduction garment at night. Sleeve is a good fit, but gauntlet is too big and pt requires glove instead. She plans to call [facility] and therapist will call as well. She continues to tolerate stetching well though is still very tight in her Rt axilla and is benefitting from myofascial release here. She had good imrpovements with her A/ROM from last weeks measurements. Pt is to have final reconstruction surgery soon to replace expander with implant and pt will benefit from continuing therapy until then to decrease axillary tightness as much as possible to allow for better recovery.   Rehab Potential Good   Clinical Impairments Affecting Rehab Potential  full axillary node dissection, previous chemo and radiation.    PT Frequency 2x / week   PT Duration 4 weeks   PT Treatment/Interventions Manual techniques;Therapeutic exercise;Passive range of motion;Patient/family education;Compression bandaging;Vasopneumatic Device;ADLs/Self Care Home Management;DME Instruction;Therapeutic activities;Orthotic Fit/Training;Manual lymph drainage;Scar mobilization   PT Next Visit Plan Myofascial release to Rt axilla during P/ROM. Cont focusing on AA/ROM for Rt shoulder and add low rows for scap retraction.   Consulted and Agree with Plan of Care Patient      Patient will benefit from skilled therapeutic intervention in order to improve the following deficits and impairments:  Decreased range of motion, Pain, Impaired UE functional use, Increased fascial restricitons, Decreased strength, Decreased knowledge of use of DME, Decreased knowledge of precautions, Decreased scar mobility, Increased edema  Visit Diagnosis: Postmastectomy lymphedema  Stiffness of right shoulder, not elsewhere classified  Chronic right shoulder  pain     Problem List Patient Active Problem List   Diagnosis Date Noted  . Genetic testing 08/16/2015  . Iron deficiency anemia due to chronic blood loss 08/03/2015  . Family history of breast cancer   . Family history of colon cancer   . Family history of prostate cancer   . Breast cancer of lower-outer quadrant of right female breast ([place]) 07/29/2015  . Leiomyoma of uterus, unspecified 08/01/2012  . Irregular menstrual cycle 08/01/2012  . 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  . HTN (hypertension) 12/22/2010  . Anemia 12/22/2010  . Acid reflux 12/22/2010    Otelia Limes, PTA 09/10/2016, 12:05 PM  [place], [place], [address] Phone: [phone]   Fax:  [phone]  Name: KYMBERLYN ECKFORD MRN: [id number] Date of Birth: 1975-12-25

## 2016-09-13 ENCOUNTER — Ambulatory Visit: Payer: BLUE CROSS/BLUE SHIELD

## 2016-09-13 DIAGNOSIS — I972 Postmastectomy lymphedema syndrome: Secondary | ICD-10-CM | POA: Diagnosis not present

## 2016-09-13 DIAGNOSIS — M25611 Stiffness of right shoulder, not elsewhere classified: Secondary | ICD-10-CM

## 2016-09-13 DIAGNOSIS — G8929 Other chronic pain: Secondary | ICD-10-CM

## 2016-09-13 DIAGNOSIS — M25511 Pain in right shoulder: Secondary | ICD-10-CM

## 2016-09-13 NOTE — Therapy (Signed)
[place], [place], [address] Phone: [phone]   Fax:  [phone]  Physical Therapy Treatment  Patient Details  Name: Melanie Moss MRN: [id number] Date of Birth: [birth year] Referring Provider: Dr. Excell Seltzer   Encounter Date: 09/13/2016      PT End of Session - 09/13/16 1220    Visit Number 16   Number of Visits 21   Date for PT Re-Evaluation 10/01/16   PT Start Time 1107   PT Stop Time 1154   PT Time Calculation (min) 47 min   Activity Tolerance Patient tolerated treatment well   Behavior During Therapy [facility] for tasks assessed/performed      Past Medical History:  Diagnosis Date  . Anemia   . Anxiety   . Breast cancer of lower-outer quadrant of right female breast ([place]) 07/29/2015  . Family history of breast cancer   . Family history of colon cancer   . Family history of prostate cancer   . GERD (gastroesophageal reflux disease)    tums   . Gestational diabetes   . Hyperlipidemia   . Hypertension    not currently on medication  . Preterm labor     Past Surgical History:  Procedure Laterality Date  . AXILLARY LYMPH NODE DISSECTION Right 09/29/2015   Procedure: AXILLARY LYMPH NODE DISSECTION;  Surgeon: Excell Seltzer, MD;  Location: [place];  Service: General;  Laterality: Right;  . BREAST RECONSTRUCTION WITH PLACEMENT OF TISSUE EXPANDER AND FLEX HD (ACELLULAR HYDRATED DERMIS) Right 09/29/2015   Procedure: BREAST RECONSTRUCTION WITH PLACEMENT OF TISSUE EXPANDER AND FLEX HD (ACELLULAR HYDRATED DERMIS);  Surgeon: Loel Lofty Dillingham, DO;  Location: [place];  Service: Plastics;  Laterality: Right;  . CERVICAL CERCLAGE  02/19/2011   Procedure: CERCLAGE CERVICAL;  Surgeon: Agnes Lawrence, MD;  Location: [facility];  Service: Gynecology;  Laterality: N/A;  . DILATION AND CURETTAGE OF UTERUS    . MASTECTOMY WITH AXILLARY LYMPH NODE DISSECTION Right 09/29/2015  . PORTACATH PLACEMENT N/A 09/29/2015    Procedure: INSERTION PORT-A-CATH;  Surgeon: Excell Seltzer, MD;  Location: [place];  Service: General;  Laterality: N/A;  . SIMPLE MASTECTOMY WITH AXILLARY SENTINEL NODE BIOPSY Right 09/29/2015   Procedure: RIGHT TOTAL  MASTECTOMY WITH AXILLARY SENTINEL NODE BIOPSY;  Surgeon: Excell Seltzer, MD;  Location: [place];  Service: General;  Laterality: Right;    There were no vitals filed for this visit.      Subjective Assessment - 09/13/16 1122    Subjective Wearing my old gauntlet today with my new sleeve since it fits better. I'll go by [facility] today and see ifthey can measure me for a glove.    Pertinent History June 8th right side mastectomy and full axillary lymph node dissection, has had chemotherapy and radiation pt currently has an expander on R.  wear compression sleeve and gauntlet to try to control lymphedema   Patient Stated Goals to get swelling under control so she can get back to work    Currently in Pain? No/denies                         [facility] Adult PT Treatment/Exercise - 09/13/16 0001      Shoulder Exercises: Pulleys   Flexion 2 minutes   ABduction 2 minutes     Shoulder Exercises: Therapy Ball   Flexion 10 reps  With forward lean into end of stretch   ABduction 10 reps  Rt  UE with side lean into end of stretch     Manual Therapy   Manual Therapy Myofascial release;Passive ROM   Myofascial Release To Rt axilla and incision/lateral border of expander during P/ROM   Passive ROM PROM to right shoulder into flexion, abduction, er and D2 pt no longer with c/o pain at end ROM stretching                        Long Term Clinic Goals - 09/03/16 1341      CC Long Term Goal  #1   Title Pt will reduce circumference of right forearm at 10 cm proximal to ulnar styloid to 22 cm    Baseline 24.6cm, 24 cm on 08/10/2016   Time 4   Period Weeks   Status On-going     CC Long Term Goal  #2   Title Pt will reduce circumference of right  forearm at 15 cm proximal to ulnar styloid to 26 cm    Baseline 28.5 on eval, 26.3 on 08/10/2016   Time 4   Period Weeks   Status On-going     CC Long Term Goal  #3   Title Pt will demonstrate 150 degrees of shoulder flexion to allow pt to reach items off a high shelf    Baseline 136, 09/03/16-149 degrees   Time 4   Period Weeks   Status On-going     CC Long Term Goal  #4   Title Pt will demonstrate at least 50 degrees of right shoulder internal  rotation to all her to return to prior level of function   Baseline 30, 09/03/16- 60 degrees   Time 8   Period Weeks   Status Achieved     CC Long Term Goal  #5   Title Pt will be independent in a home exercise program for continued strengthening and stretching    Time 4   Period Weeks   Status On-going     CC Long Term Goal  #6   Title Pt will improve Quick Dash impairment percentage to less than or equal to 20 percent for improved functional use of RUE   Baseline 59%, 11/23/15- 6.82% impairment   Time 8   Period Weeks   Status Achieved     CC Long Term Goal  #7   Title Pt to demonstrate a 50% decrease in right axillary tightness to allow improved range of motion and improved comfort.    Time 4   Period Weeks   Status New     Additional Goals   Additional Goals Yes            Plan - 09/13/16 1220    Clinical Impression Statement Focused mostly on myofascial release to Rt axilla with P/ROM today and good gaisn felt/noticed by end of stretching and with slow, prolonged holds, especially in abduction. Pt reports feeling like she sees improvement with her ROM each week. She has upcoming appt with her surgeon to discuss when her final reconstruction surgery will be (next week??) to replace expander for implant.  Pt plans to go to [facility] after session today to see if they can measure her for a compression glove.   Rehab Potential Good   Clinical Impairments Affecting Rehab Potential  full axillary node dissection, previous  chemo and radiation.    PT Frequency 2x / week   PT Duration 4 weeks   PT Treatment/Interventions Manual techniques;Therapeutic exercise;Passive range of motion;Patient/family  education;Compression bandaging;Vasopneumatic Device;ADLs/Self Care Home Management;DME Instruction;Therapeutic activities;Orthotic Fit/Training;Manual lymph drainage;Scar mobilization   PT Next Visit Plan Measure ROM for goal assess. Myofascial release to Rt axilla during P/ROM. Cont focusing on AA/ROM for Rt shoulder and add low rows for scap retraction. See if pt got golve or measured for one, to replace gauntlet.    Consulted and Agree with Plan of Care Patient      Patient will benefit from skilled therapeutic intervention in order to improve the following deficits and impairments:  Decreased range of motion, Pain, Impaired UE functional use, Increased fascial restricitons, Decreased strength, Decreased knowledge of use of DME, Decreased knowledge of precautions, Decreased scar mobility, Increased edema  Visit Diagnosis: Postmastectomy lymphedema  Stiffness of right shoulder, not elsewhere classified  Chronic right shoulder pain     Problem List Patient Active Problem List   Diagnosis Date Noted  . Genetic testing 08/16/2015  . Iron deficiency anemia due to chronic blood loss 08/03/2015  . Family history of breast cancer   . Family history of colon cancer   . Family history of prostate cancer   . Breast cancer of lower-outer quadrant of right female breast ([place]) 07/29/2015  . Leiomyoma of uterus, unspecified 08/01/2012  . Irregular menstrual cycle 08/01/2012  . 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  . HTN (hypertension) 12/22/2010  . Anemia 12/22/2010  . Acid reflux 12/22/2010    Otelia Limes, PTA 09/13/2016, 12:27 PM  [place], [place], [address] Phone: [phone]   Fax:  [phone]  Name: Melanie Moss MRN: [id number] Date of Birth: Oct 05, 1975

## 2016-09-18 ENCOUNTER — Ambulatory Visit: Payer: BLUE CROSS/BLUE SHIELD

## 2016-09-18 DIAGNOSIS — M25611 Stiffness of right shoulder, not elsewhere classified: Secondary | ICD-10-CM

## 2016-09-18 DIAGNOSIS — I972 Postmastectomy lymphedema syndrome: Secondary | ICD-10-CM

## 2016-09-18 DIAGNOSIS — M25511 Pain in right shoulder: Secondary | ICD-10-CM

## 2016-09-18 DIAGNOSIS — G8929 Other chronic pain: Secondary | ICD-10-CM

## 2016-09-18 NOTE — Therapy (Signed)
[place], [place], [address] Phone: [phone]   Fax:  [phone]  Physical Therapy Treatment  Patient Details  Name: Melanie Moss MRN: [id number] Date of Birth: 03/07/1976 Referring Provider: Dr. Excell Seltzer   Encounter Date: 09/18/2016      PT End of Session - 09/18/16 1150    Visit Number 17   Number of Visits 21   Date for PT Re-Evaluation 10/01/16   PT Start Time 1102   PT Stop Time 1150   PT Time Calculation (min) 48 min   Activity Tolerance Patient tolerated treatment well   Behavior During Therapy [facility] for tasks assessed/performed      Past Medical History:  Diagnosis Date  . Anemia   . Anxiety   . Breast cancer of lower-outer quadrant of right female breast ([place]) 07/29/2015  . Family history of breast cancer   . Family history of colon cancer   . Family history of prostate cancer   . GERD (gastroesophageal reflux disease)    tums   . Gestational diabetes   . Hyperlipidemia   . Hypertension    not currently on medication  . Preterm labor     Past Surgical History:  Procedure Laterality Date  . AXILLARY LYMPH NODE DISSECTION Right 09/29/2015   Procedure: AXILLARY LYMPH NODE DISSECTION;  Surgeon: Excell Seltzer, MD;  Location: [place];  Service: General;  Laterality: Right;  . BREAST RECONSTRUCTION WITH PLACEMENT OF TISSUE EXPANDER AND FLEX HD (ACELLULAR HYDRATED DERMIS) Right 09/29/2015   Procedure: BREAST RECONSTRUCTION WITH PLACEMENT OF TISSUE EXPANDER AND FLEX HD (ACELLULAR HYDRATED DERMIS);  Surgeon: Loel Lofty Dillingham, DO;  Location: [place];  Service: Plastics;  Laterality: Right;  . CERVICAL CERCLAGE  02/19/2011   Procedure: CERCLAGE CERVICAL;  Surgeon: Agnes Lawrence, MD;  Location: [facility];  Service: Gynecology;  Laterality: N/A;  . DILATION AND CURETTAGE OF UTERUS    . MASTECTOMY WITH AXILLARY LYMPH NODE DISSECTION Right 09/29/2015  . PORTACATH PLACEMENT N/A 09/29/2015    Procedure: INSERTION PORT-A-CATH;  Surgeon: Excell Seltzer, MD;  Location: [place];  Service: General;  Laterality: N/A;  . SIMPLE MASTECTOMY WITH AXILLARY SENTINEL NODE BIOPSY Right 09/29/2015   Procedure: RIGHT TOTAL  MASTECTOMY WITH AXILLARY SENTINEL NODE BIOPSY;  Surgeon: Excell Seltzer, MD;  Location: [place];  Service: General;  Laterality: Right;    There were no vitals filed for this visit.      Subjective Assessment - 09/18/16 1106    Subjective Going to [facility] after I leave here today to get measured for my glove and return the gauntlet that is too big. I just saw Dr. Marla Roe this morning and she wants to wait until Aug/Sept to do my final reconstruction surgery. She did a fill of 30 cc's into my expander this morning and it feels fine, just a little tight.    Pertinent History June 8th right side mastectomy and full axillary lymph node dissection, has had chemotherapy and radiation pt currently has an expander on R.  wear compression sleeve and gauntlet to try to control lymphedema   Patient Stated Goals to get swelling under control so she can get back to work    Currently in Pain? No/denies                         [facility] Adult PT Treatment/Exercise - 09/18/16 0001      Shoulder Exercises: Pulleys   Flexion  2 minutes   ABduction 2 minutes     Shoulder Exercises: Therapy Ball   Flexion 10 reps  With forward lean into end of stretch   ABduction 10 reps  Rt UE with side lean into end of stretch     Manual Therapy   Manual Therapy Myofascial release;Passive ROM   Myofascial Release To Rt axilla and incision/lateral border of expander during P/ROM, though did this gently today as pt just received fill in her expander   Passive ROM PROM to right shoulder into flexion, abduction, er and D2 gently today as pt just had her last fill in expander                        [facility] Goals - 09/03/16 1341      CC Long Term Goal  #1    Title Pt will reduce circumference of right forearm at 10 cm proximal to ulnar styloid to 22 cm    Baseline 24.6cm, 24 cm on 08/10/2016   Time 4   Period Weeks   Status On-going     CC Long Term Goal  #2   Title Pt will reduce circumference of right forearm at 15 cm proximal to ulnar styloid to 26 cm    Baseline 28.5 on eval, 26.3 on 08/10/2016   Time 4   Period Weeks   Status On-going     CC Long Term Goal  #3   Title Pt will demonstrate 150 degrees of shoulder flexion to allow pt to reach items off a high shelf    Baseline 136, 09/03/16-149 degrees   Time 4   Period Weeks   Status On-going     CC Long Term Goal  #4   Title Pt will demonstrate at least 50 degrees of right shoulder internal  rotation to all her to return to prior level of function   Baseline 30, 09/03/16- 60 degrees   Time 8   Period Weeks   Status Achieved     CC Long Term Goal  #5   Title Pt will be independent in a home exercise program for continued strengthening and stretching    Time 4   Period Weeks   Status On-going     CC Long Term Goal  #6   Title Pt will improve Quick Dash impairment percentage to less than or equal to 20 percent for improved functional use of RUE   Baseline 59%, 11/23/15- 6.82% impairment   Time 8   Period Weeks   Status Achieved     CC Long Term Goal  #7   Title Pt to demonstrate a 50% decrease in right axillary tightness to allow improved range of motion and improved comfort.    Time 4   Period Weeks   Status New     Additional Goals   Additional Goals Yes            Plan - 09/18/16 1157    Clinical Impression Statement Pt just had a fill in her expander this morning before therapy (30 cc's) and she reports having some slight feelings of tightness in Rt chest but wanted to be stretched today, so after speaking with Serafina Royals, PT she agreed okay to stetch pt today but not as aggressively as usual. So foused on mostly manual therapy and only stetching to pts  tolerance and she reported feeling good after session. She was to go to [facility] after session  today to get measured for compression glove to replace her ill-fitting gauntlet.    Rehab Potential Good   Clinical Impairments Affecting Rehab Potential  full axillary node dissection, previous chemo and radiation.    PT Frequency 2x / week   PT Duration 4 weeks   PT Treatment/Interventions Manual techniques;Therapeutic exercise;Passive range of motion;Patient/family education;Compression bandaging;Vasopneumatic Device;ADLs/Self Care Home Management;DME Instruction;Therapeutic activities;Orthotic Fit/Training;Manual lymph drainage;Scar mobilization   PT Next Visit Plan Measure ROM for goal assess. See if pt was able to get measured for compression glove. Myofascial release to Rt axilla during P/ROM. Cont focusing on AA/ROM for Rt shoulder and add low rows for scap retraction. See if pt got golve or measured for one, to replace gauntlet.    Consulted and Agree with Plan of Care Patient      Patient will benefit from skilled therapeutic intervention in order to improve the following deficits and impairments:  Decreased range of motion, Pain, Impaired UE functional use, Increased fascial restricitons, Decreased strength, Decreased knowledge of use of DME, Decreased knowledge of precautions, Decreased scar mobility, Increased edema  Visit Diagnosis: Postmastectomy lymphedema  Stiffness of right shoulder, not elsewhere classified  Chronic right shoulder pain     Problem List Patient Active Problem List   Diagnosis Date Noted  . Genetic testing 08/16/2015  . Iron deficiency anemia due to chronic blood loss 08/03/2015  . Family history of breast cancer   . Family history of colon cancer   . Family history of prostate cancer   . Breast cancer of lower-outer quadrant of right female breast ([place]) 07/29/2015  . Leiomyoma of uterus, unspecified 08/01/2012  . Irregular menstrual cycle 08/01/2012   . 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  . HTN (hypertension) 12/22/2010  . Anemia 12/22/2010  . Acid reflux 12/22/2010    Otelia Limes, PTA 09/18/2016, 12:06 PM  [place], [place], [address] Phone: [phone]   Fax:  [phone]  Name: Melanie Moss MRN: [id number] Date of Birth: [birth year]

## 2016-09-20 ENCOUNTER — Other Ambulatory Visit (HOSPITAL_BASED_OUTPATIENT_CLINIC_OR_DEPARTMENT_OTHER): Payer: BLUE CROSS/BLUE SHIELD

## 2016-09-20 ENCOUNTER — Encounter: Payer: Self-pay | Admitting: *Deleted

## 2016-09-20 ENCOUNTER — Ambulatory Visit (HOSPITAL_BASED_OUTPATIENT_CLINIC_OR_DEPARTMENT_OTHER): Payer: BLUE CROSS/BLUE SHIELD

## 2016-09-20 VITALS — BP 141/92 | HR 88 | Temp 98.4°F | Resp 20

## 2016-09-20 DIAGNOSIS — C50511 Malignant neoplasm of lower-outer quadrant of right female breast: Secondary | ICD-10-CM

## 2016-09-20 DIAGNOSIS — Z5112 Encounter for antineoplastic immunotherapy: Secondary | ICD-10-CM

## 2016-09-20 DIAGNOSIS — Z170 Estrogen receptor positive status [ER+]: Principal | ICD-10-CM

## 2016-09-20 LAB — CBC WITH DIFFERENTIAL/PLATELET
BASO%: 0.1 % (ref 0.0–2.0)
Basophils Absolute: 0 10*3/uL (ref 0.0–0.1)
EOS%: 1.3 % (ref 0.0–7.0)
Eosinophils Absolute: 0.1 10*3/uL (ref 0.0–0.5)
HCT: 32.1 % — ABNORMAL LOW (ref 34.8–46.6)
HGB: 10.6 g/dL — ABNORMAL LOW (ref 11.6–15.9)
LYMPH%: 31.3 % (ref 14.0–49.7)
MCH: 30.8 pg (ref 25.1–34.0)
MCHC: 33 g/dL (ref 31.5–36.0)
MCV: 93.3 fL (ref 79.5–101.0)
MONO#: 0.4 10*3/uL (ref 0.1–0.9)
MONO%: 5 % (ref 0.0–14.0)
NEUT#: 5 10*3/uL (ref 1.5–6.5)
NEUT%: 62.3 % (ref 38.4–76.8)
Platelets: 186 10*3/uL (ref 145–400)
RBC: 3.44 10*6/uL — ABNORMAL LOW (ref 3.70–5.45)
RDW: 13 % (ref 11.2–14.5)
WBC: 8 10*3/uL (ref 3.9–10.3)
lymph#: 2.5 10*3/uL (ref 0.9–3.3)

## 2016-09-20 LAB — COMPREHENSIVE METABOLIC PANEL
ALT: 13 U/L (ref 0–55)
AST: 20 U/L (ref 5–34)
Albumin: 3.9 g/dL (ref 3.5–5.0)
Alkaline Phosphatase: 51 U/L (ref 40–150)
Anion Gap: 8 mEq/L (ref 3–11)
BUN: 23.1 mg/dL (ref 7.0–26.0)
CO2: 23 mEq/L (ref 22–29)
Calcium: 9.3 mg/dL (ref 8.4–10.4)
Chloride: 110 mEq/L — ABNORMAL HIGH (ref 98–109)
Creatinine: 0.9 mg/dL (ref 0.6–1.1)
EGFR: >90 mL/min/{1.73_m2} (ref >90)
Glucose: 101 mg/dl (ref 70–140)
Potassium: 3.7 mEq/L (ref 3.5–5.1)
Sodium: 141 mEq/L (ref 136–145)
Total Bilirubin: 0.28 mg/dL (ref 0.20–1.20)
Total Protein: 7.4 g/dL (ref 6.4–8.3)

## 2016-09-20 MED ORDER — HEPARIN SOD (PORK) LOCK FLUSH 100 UNIT/ML IV SOLN
500.0000 [IU] | Freq: Once | INTRAVENOUS | Status: AC | PRN
  Administered 2016-09-20: 500 [IU]
  Filled 2016-09-20: qty 5

## 2016-09-20 MED ORDER — TRASTUZUMAB CHEMO 150 MG IV SOLR
6.0000 mg/kg | Freq: Once | INTRAVENOUS | Status: AC
  Administered 2016-09-20: 420 mg via INTRAVENOUS
  Filled 2016-09-20: qty 20

## 2016-09-20 MED ORDER — SODIUM CHLORIDE 0.9 % IV SOLN
Freq: Once | INTRAVENOUS | Status: AC
  Administered 2016-09-20: 11:00:00 via INTRAVENOUS

## 2016-09-20 MED ORDER — SODIUM CHLORIDE 0.9% FLUSH
10.0000 mL | INTRAVENOUS | Status: DC | PRN
  Administered 2016-09-20: 10 mL
  Filled 2016-09-20: qty 10

## 2016-09-20 MED ORDER — ACETAMINOPHEN 325 MG PO TABS
650.0000 mg | ORAL_TABLET | Freq: Once | ORAL | Status: AC
  Administered 2016-09-20: 650 mg via ORAL

## 2016-09-20 MED ORDER — DIPHENHYDRAMINE HCL 25 MG PO CAPS
ORAL_CAPSULE | ORAL | Status: AC
  Filled 2016-09-20: qty 2

## 2016-09-20 MED ORDER — SODIUM CHLORIDE 0.9 % IV SOLN
420.0000 mg | Freq: Once | INTRAVENOUS | Status: AC
  Administered 2016-09-20: 420 mg via INTRAVENOUS
  Filled 2016-09-20: qty 14

## 2016-09-20 MED ORDER — ACETAMINOPHEN 325 MG PO TABS
ORAL_TABLET | ORAL | Status: AC
  Filled 2016-09-20: qty 2

## 2016-09-20 MED ORDER — DIPHENHYDRAMINE HCL 25 MG PO CAPS
50.0000 mg | ORAL_CAPSULE | Freq: Once | ORAL | Status: AC
  Administered 2016-09-20: 50 mg via ORAL

## 2016-09-20 NOTE — Patient Instructions (Signed)
[facility] Discharge Instructions for Patients Receiving Chemotherapy  Today you received the following chemotherapy agents:  Herceptin, Perjeta  To help prevent nausea and vomiting after your treatment, we encourage you to take your nausea medication as prescribed.   If you develop nausea and vomiting that is not controlled by your nausea medication, call the clinic.   BELOW ARE SYMPTOMS THAT SHOULD BE REPORTED IMMEDIATELY:  *FEVER GREATER THAN 100.5 F  *CHILLS WITH OR WITHOUT FEVER  NAUSEA AND VOMITING THAT IS NOT CONTROLLED WITH YOUR NAUSEA MEDICATION  *UNUSUAL SHORTNESS OF BREATH  *UNUSUAL BRUISING OR BLEEDING  TENDERNESS IN MOUTH AND THROAT WITH OR WITHOUT PRESENCE OF ULCERS  *URINARY PROBLEMS  *BOWEL PROBLEMS  UNUSUAL RASH Items with * indicate a potential emergency and should be followed up as soon as possible.  Feel free to call the clinic you have any questions or concerns. The clinic phone number is [phone].  Please show the CHEMO ALERT CARD at check-in to the Emergency Department and triage nurse.   

## 2016-09-21 ENCOUNTER — Ambulatory Visit: Payer: BLUE CROSS/BLUE SHIELD | Admitting: Physical Therapy

## 2016-09-24 ENCOUNTER — Ambulatory Visit: Payer: BLUE CROSS/BLUE SHIELD | Attending: General Surgery

## 2016-09-24 DIAGNOSIS — M25611 Stiffness of right shoulder, not elsewhere classified: Secondary | ICD-10-CM | POA: Diagnosis present

## 2016-09-24 DIAGNOSIS — M25511 Pain in right shoulder: Secondary | ICD-10-CM | POA: Insufficient documentation

## 2016-09-24 DIAGNOSIS — G8929 Other chronic pain: Secondary | ICD-10-CM

## 2016-09-24 DIAGNOSIS — I972 Postmastectomy lymphedema syndrome: Secondary | ICD-10-CM | POA: Insufficient documentation

## 2016-09-24 NOTE — Therapy (Signed)
[place], [place], [address] Phone: [phone]   Fax:  [phone]  Physical Therapy Treatment  Patient Details  Name: Melanie Moss MRN: [id number] Date of Birth: 05/26/1975 Referring Provider: Dr. Excell Seltzer   Encounter Date: 09/24/2016      PT End of Session - 09/24/16 1200    Visit Number 18   Number of Visits 21   Date for PT Re-Evaluation 10/01/16   PT Start Time 1108   PT Stop Time 1152   PT Time Calculation (min) 44 min   Activity Tolerance Patient tolerated treatment well   Behavior During Therapy [facility] for tasks assessed/performed      Past Medical History:  Diagnosis Date  . Anemia   . Anxiety   . Breast cancer of lower-outer quadrant of right female breast ([place]) 07/29/2015  . Family history of breast cancer   . Family history of colon cancer   . Family history of prostate cancer   . GERD (gastroesophageal reflux disease)    tums   . Gestational diabetes   . Hyperlipidemia   . Hypertension    not currently on medication  . Preterm labor     Past Surgical History:  Procedure Laterality Date  . AXILLARY LYMPH NODE DISSECTION Right 09/29/2015   Procedure: AXILLARY LYMPH NODE DISSECTION;  Surgeon: Excell Seltzer, MD;  Location: [place];  Service: General;  Laterality: Right;  . BREAST RECONSTRUCTION WITH PLACEMENT OF TISSUE EXPANDER AND FLEX HD (ACELLULAR HYDRATED DERMIS) Right 09/29/2015   Procedure: BREAST RECONSTRUCTION WITH PLACEMENT OF TISSUE EXPANDER AND FLEX HD (ACELLULAR HYDRATED DERMIS);  Surgeon: Loel Lofty Dillingham, DO;  Location: [place];  Service: Plastics;  Laterality: Right;  . CERVICAL CERCLAGE  02/19/2011   Procedure: CERCLAGE CERVICAL;  Surgeon: Agnes Lawrence, MD;  Location: [place];  Service: Gynecology;  Laterality: N/A;  . DILATION AND CURETTAGE OF UTERUS    . MASTECTOMY WITH AXILLARY LYMPH NODE DISSECTION Right 09/29/2015  . PORTACATH PLACEMENT N/A 09/29/2015    Procedure: INSERTION PORT-A-CATH;  Surgeon: Excell Seltzer, MD;  Location: [place];  Service: General;  Laterality: N/A;  . SIMPLE MASTECTOMY WITH AXILLARY SENTINEL NODE BIOPSY Right 09/29/2015   Procedure: RIGHT TOTAL  MASTECTOMY WITH AXILLARY SENTINEL NODE BIOPSY;  Surgeon: Excell Seltzer, MD;  Location: [place];  Service: General;  Laterality: Right;    There were no vitals filed for this visit.      Subjective Assessment - 09/24/16 1119    Subjective I went to [facility] and got measured for my glove so that should arrive soon. I don't think my Rt sholder is going to be all the way better but it certainly has improved.    Pertinent History June 8th right side mastectomy and full axillary lymph node dissection, has had chemotherapy and radiation pt currently has an expander on R.  wear compression sleeve and gauntlet to try to control lymphedema   Patient Stated Goals to get swelling under control so she can get back to work    Currently in Pain? No/denies                         [facility] Adult PT Treatment/Exercise - 09/24/16 0001      Shoulder Exercises: Pulleys   Flexion 2 minutes   ABduction 2 minutes     Shoulder Exercises: Therapy Ball   Flexion 10 reps  With forward lean into end of stretch  ABduction 10 reps  Rt UE with side lean into end of stretch     Manual Therapy   Manual Therapy Myofascial release;Passive ROM   Myofascial Release To Rt axilla and incision/lateral border of expander during P/ROM   Passive ROM PROM to right shoulder into flexion, abduction, er and D2                        [facility] Goals - 09/03/16 1341      CC Long Term Goal  #1   Title Pt will reduce circumference of right forearm at 10 cm proximal to ulnar styloid to 22 cm    Baseline 24.6cm, 24 cm on 08/10/2016   Time 4   Period Weeks   Status On-going     CC Long Term Goal  #2   Title Pt will reduce circumference of right forearm at 15 cm  proximal to ulnar styloid to 26 cm    Baseline 28.5 on eval, 26.3 on 08/10/2016   Time 4   Period Weeks   Status On-going     CC Long Term Goal  #3   Title Pt will demonstrate 150 degrees of shoulder flexion to allow pt to reach items off a high shelf    Baseline 136, 09/03/16-149 degrees   Time 4   Period Weeks   Status On-going     CC Long Term Goal  #4   Title Pt will demonstrate at least 50 degrees of right shoulder internal  rotation to all her to return to prior level of function   Baseline 30, 09/03/16- 60 degrees   Time 8   Period Weeks   Status Achieved     CC Long Term Goal  #5   Title Pt will be independent in a home exercise program for continued strengthening and stretching    Time 4   Period Weeks   Status On-going     CC Long Term Goal  #6   Title Pt will improve Quick Dash impairment percentage to less than or equal to 20 percent for improved functional use of RUE   Baseline 59%, 11/23/15- 6.82% impairment   Time 8   Period Weeks   Status Achieved     CC Long Term Goal  #7   Title Pt to demonstrate a 50% decrease in right axillary tightness to allow improved range of motion and improved comfort.    Time 4   Period Weeks   Status New     Additional Goals   Additional Goals Yes            Plan - 09/24/16 1201    Clinical Impression Statement Pts end P/ROM seemed much imrpoved today as with flexion pt was able to mostly rest her arm on pillow near her head for myofascial release to axilla. She has been mesaured for a compression glove to replace the ill-fitting gauntlet she received and this should arrive within the next 1-2 weeks. She reports feeling she is able to maintain her lymphedema well once this arrives and with all the other tools and learning she has gleaned from her time here.    Rehab Potential Good   Clinical Impairments Affecting Rehab Potential  full axillary node dissection, previous chemo and radiation.    PT Frequency 2x / week   PT  Duration 4 weeks   PT Treatment/Interventions Manual techniques;Therapeutic exercise;Passive range of motion;Patient/family education;Compression bandaging;Vasopneumatic Device;ADLs/Self Care Home Management;DME Instruction;Therapeutic activities;Orthotic  Fit/Training;Manual lymph drainage;Scar mobilization   PT Next Visit Plan Measure ROM for goal assess. Myofascial release to Rt axilla during P/ROM. Cont focusing on AA/ROM for Rt shoulder and add low rows for scap retraction.    Consulted and Agree with Plan of Care Patient      Patient will benefit from skilled therapeutic intervention in order to improve the following deficits and impairments:  Decreased range of motion, Pain, Impaired UE functional use, Increased fascial restricitons, Decreased strength, Decreased knowledge of use of DME, Decreased knowledge of precautions, Decreased scar mobility, Increased edema  Visit Diagnosis: Postmastectomy lymphedema  Stiffness of right shoulder, not elsewhere classified  Chronic right shoulder pain     Problem List Patient Active Problem List   Diagnosis Date Noted  . Genetic testing 08/16/2015  . Iron deficiency anemia due to chronic blood loss 08/03/2015  . Family history of breast cancer   . Family history of colon cancer   . Family history of prostate cancer   . Breast cancer of lower-outer quadrant of right female breast ([place]) 07/29/2015  . Leiomyoma of uterus, unspecified 08/01/2012  . Irregular menstrual cycle 08/01/2012  . 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  . HTN (hypertension) 12/22/2010  . Anemia 12/22/2010  . Acid reflux 12/22/2010    Otelia Limes, PTA 09/24/2016, 12:04 PM  [place], [place], [address] Phone: [phone]   Fax:  [phone]  Name: SHALAE BELMONTE MRN: [id number] Date of Birth: 06-Jul-1975

## 2016-09-26 ENCOUNTER — Ambulatory Visit: Payer: BLUE CROSS/BLUE SHIELD

## 2016-09-26 DIAGNOSIS — G8929 Other chronic pain: Secondary | ICD-10-CM

## 2016-09-26 DIAGNOSIS — I972 Postmastectomy lymphedema syndrome: Secondary | ICD-10-CM | POA: Diagnosis not present

## 2016-09-26 DIAGNOSIS — M25611 Stiffness of right shoulder, not elsewhere classified: Secondary | ICD-10-CM

## 2016-09-26 DIAGNOSIS — M25511 Pain in right shoulder: Secondary | ICD-10-CM

## 2016-09-26 NOTE — Therapy (Signed)
[place], [place], [address] Phone: [phone]   Fax:  [phone]  Physical Therapy Treatment  Patient Details  Name: Melanie Moss MRN: [id number] Date of Birth: 1975/08/31 Referring Provider: Dr. Excell Seltzer   Encounter Date: 09/26/2016      PT End of Session - 09/26/16 1105    Visit Number 19   Number of Visits 21   Date for PT Re-Evaluation 10/01/16   PT Start Time 4854   PT Stop Time 1105   PT Time Calculation (min) 42 min   Activity Tolerance Patient tolerated treatment well   Behavior During Therapy [facility] for tasks assessed/performed      Past Medical History:  Diagnosis Date  . Anemia   . Anxiety   . Breast cancer of lower-outer quadrant of right female breast ([place]) 07/29/2015  . Family history of breast cancer   . Family history of colon cancer   . Family history of prostate cancer   . GERD (gastroesophageal reflux disease)    tums   . Gestational diabetes   . Hyperlipidemia   . Hypertension    not currently on medication  . Preterm labor     Past Surgical History:  Procedure Laterality Date  . AXILLARY LYMPH NODE DISSECTION Right 09/29/2015   Procedure: AXILLARY LYMPH NODE DISSECTION;  Surgeon: Excell Seltzer, MD;  Location: [place];  Service: General;  Laterality: Right;  . BREAST RECONSTRUCTION WITH PLACEMENT OF TISSUE EXPANDER AND FLEX HD (ACELLULAR HYDRATED DERMIS) Right 09/29/2015   Procedure: BREAST RECONSTRUCTION WITH PLACEMENT OF TISSUE EXPANDER AND FLEX HD (ACELLULAR HYDRATED DERMIS);  Surgeon: Loel Lofty Dillingham, DO;  Location: [place];  Service: Plastics;  Laterality: Right;  . CERVICAL CERCLAGE  02/19/2011   Procedure: CERCLAGE CERVICAL;  Surgeon: Agnes Lawrence, MD;  Location: [place] [facility];  Service: Gynecology;  Laterality: N/A;  . DILATION AND CURETTAGE OF UTERUS    . MASTECTOMY WITH AXILLARY LYMPH NODE DISSECTION Right 09/29/2015  . PORTACATH PLACEMENT N/A 09/29/2015   Procedure: INSERTION PORT-A-CATH;  Surgeon: Excell Seltzer, MD;  Location: [place];  Service: General;  Laterality: N/A;  . SIMPLE MASTECTOMY WITH AXILLARY SENTINEL NODE BIOPSY Right 09/29/2015   Procedure: RIGHT TOTAL  MASTECTOMY WITH AXILLARY SENTINEL NODE BIOPSY;  Surgeon: Excell Seltzer, MD;  Location: [place];  Service: General;  Laterality: Right;    There were no vitals filed for this visit.      Subjective Assessment - 09/26/16 1026    Subjective Been stretching more in the morning after I get out of the shower. I hope to go back to work end of this month. And I go get my new compression glove and other sleeve I ordered after I leave here today.    Pertinent History June 8th right side mastectomy and full axillary lymph node dissection, has had chemotherapy and radiation pt currently has an expander on R.  wear compression sleeve and gauntlet to try to control lymphedema   Patient Stated Goals to get swelling under control so she can get back to work    Currently in Pain? No/denies            [facility] PT Assessment - 09/26/16 0001      AROM   Right Shoulder Flexion 158 Degrees   Right Shoulder ABduction 171 Degrees   Right Shoulder Internal Rotation 83 Degrees  [facility] Adult PT Treatment/Exercise - 09/26/16 0001      Manual Therapy   Manual Therapy Myofascial release;Passive ROM   Myofascial Release To Rt axilla and incision/lateral border of expander during P/ROM   Passive ROM PROM to right shoulder into flexion, abduction, er and D2                        [facility] Goals - 09/03/16 1341      CC Long Term Goal  #1   Title Pt will reduce circumference of right forearm at 10 cm proximal to ulnar styloid to 22 cm    Baseline 24.6cm, 24 cm on 08/10/2016   Time 4   Period Weeks   Status On-going     CC Long Term Goal  #2   Title Pt will reduce circumference of right forearm at 15 cm proximal to ulnar styloid to 26 cm     Baseline 28.5 on eval, 26.3 on 08/10/2016   Time 4   Period Weeks   Status On-going     CC Long Term Goal  #3   Title Pt will demonstrate 150 degrees of shoulder flexion to allow pt to reach items off a high shelf    Baseline 136, 09/03/16-149 degrees   Time 4   Period Weeks   Status On-going     CC Long Term Goal  #4   Title Pt will demonstrate at least 50 degrees of right shoulder internal  rotation to all her to return to prior level of function   Baseline 30, 09/03/16- 60 degrees   Time 8   Period Weeks   Status Achieved     CC Long Term Goal  #5   Title Pt will be independent in a home exercise program for continued strengthening and stretching    Time 4   Period Weeks   Status On-going     CC Long Term Goal  #6   Title Pt will improve Quick Dash impairment percentage to less than or equal to 20 percent for improved functional use of RUE   Baseline 59%, 11/23/15- 6.82% impairment   Time 8   Period Weeks   Status Achieved     CC Long Term Goal  #7   Title Pt to demonstrate a 50% decrease in right axillary tightness to allow improved range of motion and improved comfort.    Time 4   Period Weeks   Status New     Additional Goals   Additional Goals Yes            Plan - 09/26/16 1105    Clinical Impression Statement Focused on manual therapy today with end ROM stretching and myofascial release and pts A/ROM imporved by end of visit compared to last time measured. Pt continuing to show good progress towards increased ROM.    Rehab Potential Good   Clinical Impairments Affecting Rehab Potential  full axillary node dissection, previous chemo and radiation.    PT Frequency 2x / week   PT Duration 4 weeks   PT Treatment/Interventions Manual techniques;Therapeutic exercise;Passive range of motion;Patient/family education;Compression bandaging;Vasopneumatic Device;ADLs/Self Care Home Management;DME Instruction;Therapeutic activities;Orthotic Fit/Training;Manual lymph  drainage;Scar mobilization   PT Next Visit Plan Myofascial release to Rt axilla during P/ROM. Cont focusing on AA/ROM for Rt shoulder and add low rows for scap retraction. D/C next visit per POC.    Consulted and Agree with Plan of Care Patient  Patient will benefit from skilled therapeutic intervention in order to improve the following deficits and impairments:  Decreased range of motion, Pain, Impaired UE functional use, Increased fascial restricitons, Decreased strength, Decreased knowledge of use of DME, Decreased knowledge of precautions, Decreased scar mobility, Increased edema  Visit Diagnosis: Postmastectomy lymphedema  Stiffness of right shoulder, not elsewhere classified  Chronic right shoulder pain     Problem List Patient Active Problem List   Diagnosis Date Noted  . Genetic testing 08/16/2015  . Iron deficiency anemia due to chronic blood loss 08/03/2015  . Family history of breast cancer   . Family history of colon cancer   . Family history of prostate cancer   . Breast cancer of lower-outer quadrant of right female breast ([place]) 07/29/2015  . Leiomyoma of uterus, unspecified 08/01/2012  . Irregular menstrual cycle 08/01/2012  . 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  . HTN (hypertension) 12/22/2010  . Anemia 12/22/2010  . Acid reflux 12/22/2010    Otelia Limes, PTA 09/26/2016, 11:09 AM  [place], [place], [address] Phone: [phone]   Fax:  [phone]  Name: STEPHIE XU MRN: [id number] Date of Birth: [birth year]

## 2016-10-01 ENCOUNTER — Ambulatory Visit: Payer: BLUE CROSS/BLUE SHIELD | Admitting: Physical Therapy

## 2016-10-03 ENCOUNTER — Ambulatory Visit: Payer: BLUE CROSS/BLUE SHIELD | Admitting: Physical Therapy

## 2016-10-03 DIAGNOSIS — G8929 Other chronic pain: Secondary | ICD-10-CM

## 2016-10-03 DIAGNOSIS — I972 Postmastectomy lymphedema syndrome: Secondary | ICD-10-CM

## 2016-10-03 DIAGNOSIS — M25511 Pain in right shoulder: Secondary | ICD-10-CM

## 2016-10-03 DIAGNOSIS — M25611 Stiffness of right shoulder, not elsewhere classified: Secondary | ICD-10-CM

## 2016-10-03 NOTE — Therapy (Signed)
[facility] [address] [place], [place], [address] Phone: [phone]   Fax:  [phone]  Physical Therapy Treatment  Patient Details  Name: Melanie Moss MRN: [id number] Date of Birth: 1976/03/26 Referring Provider: Dr. Johna Sheriff   Encounter Date: 10/03/2016      PT End of Session - 10/03/16 1215    Visit Number 20   Number of Visits 21   Date for PT Re-Evaluation 10/01/16   PT Start Time 1115  pt did not need full session time    PT Stop Time 1150   PT Time Calculation (min) 35 min   Activity Tolerance Patient tolerated treatment well   Behavior During Therapy [facility] for tasks assessed/performed      Past Medical History:  Diagnosis Date  . Anemia   . Anxiety   . Breast cancer of lower-outer quadrant of right female breast (HCC) 07/29/2015  . Family history of breast cancer   . Family history of colon cancer   . Family history of prostate cancer   . GERD (gastroesophageal reflux disease)    tums   . Gestational diabetes   . Hyperlipidemia   . Hypertension    not currently on medication  . Preterm labor     Past Surgical History:  Procedure Laterality Date  . AXILLARY LYMPH NODE DISSECTION Right 09/29/2015   Procedure: AXILLARY LYMPH NODE DISSECTION;  Surgeon: Glenna Fellows, MD;  Location: [facility] OR;  Service: General;  Laterality: Right;  . BREAST RECONSTRUCTION WITH PLACEMENT OF TISSUE EXPANDER AND FLEX HD (ACELLULAR HYDRATED DERMIS) Right 09/29/2015   Procedure: BREAST RECONSTRUCTION WITH PLACEMENT OF TISSUE EXPANDER AND FLEX HD (ACELLULAR HYDRATED DERMIS);  Surgeon: Alena Bills Dillingham, DO;  Location: [facility] OR;  Service: Plastics;  Laterality: Right;  . CERVICAL CERCLAGE  02/19/2011   Procedure: CERCLAGE CERVICAL;  Surgeon: Roseanna Rainbow, MD;  Location: [facility] ORS;  Service: Gynecology;  Laterality: N/A;  . DILATION AND CURETTAGE OF UTERUS    . MASTECTOMY WITH AXILLARY LYMPH NODE DISSECTION Right 09/29/2015   . PORTACATH PLACEMENT N/A 09/29/2015   Procedure: INSERTION PORT-A-CATH;  Surgeon: Glenna Fellows, MD;  Location: [facility] OR;  Service: General;  Laterality: N/A;  . SIMPLE MASTECTOMY WITH AXILLARY SENTINEL NODE BIOPSY Right 09/29/2015   Procedure: RIGHT TOTAL  MASTECTOMY WITH AXILLARY SENTINEL NODE BIOPSY;  Surgeon: Glenna Fellows, MD;  Location: [facility] OR;  Service: General;  Laterality: Right;    There were no vitals filed for this visit.      Subjective Assessment - 10/03/16 1211    Subjective Pt is doing well and feels she is ready to discharge from PT. She comes in wearing a Lymphediva circular knit sleeve.  She says she has been using her Flexitouch pump and compression sleeves and doing her exercises.  She plans to go back to work on July 1, but she may not have her implant surgery until October.    Pertinent History June 8th right side mastectomy and full axillary lymph node dissection, has had chemotherapy and radiation pt currently has an expander on R.  wear compression sleeve and gauntlet to try to control lymphedema   Patient Stated Goals to get swelling under control so she can get back to work    Currently in Pain? No/denies            [facility] PT Assessment - 10/03/16 0001      Observation/Other Assessments   Observations Pt has bandaid over forearm where she says she sustained  a burn while cooking .  It is not red or wam. pt states she does not want me to remove bandaid  she says it is okay      AROM   Right Shoulder Flexion 150 Degrees           LYMPHEDEMA/ONCOLOGY QUESTIONNAIRE - 10/03/16 1227      Right Upper Extremity Lymphedema   15 cm Proximal to Ulnar Styloid Process 28.4 cm   10 cm Proximal to Ulnar Styloid Process 24 cm                  [facility] Adult PT Treatment/Exercise - 10/03/16 0001      Self-Care   Other Self-Care Comments  Medi rep here and measured her for a flat knit sleeve and glove since she did not get one . Remasured arm and assessed  goals for pt.                         Long Term Clinic Goals - 10/03/16 1221      [facility] Long Term Goal  #1   Title Pt will reduce circumference of right forearm at 10 cm proximal to ulnar styloid to 22 cm    Baseline 24.6cm, 24 cm on 08/10/2016, 24 on 10/03/2016    Status Partially Met     [facility] Long Term Goal  #2   Title Pt will reduce circumference of right forearm at 15 cm proximal to ulnar styloid to 26 cm    Baseline 28.5 on eval, 26.3 on 08/10/2016, 28.4 on 10/04/2011    Status Not Met     [facility] Long Term Goal  #3   Title Pt will demonstrate 150 degrees of shoulder flexion to allow pt to reach items off a high shelf    Baseline 136, 09/03/16-149 degrees, 150 on 10/03/2016   Status Achieved     [facility] Long Term Goal  #4   Title Pt will demonstrate at least 50 degrees of right shoulder internal  rotation to all her to return to prior level of function   Baseline 30, 09/03/16- 60 degrees   Status Achieved     [facility] Long Term Goal  #5   Title Pt will be independent in a home exercise program for continued strengthening and stretching    Status Achieved     [facility] Long Term Goal  #6   Title Pt will improve Quick Dash impairment percentage to less than or equal to 20 percent for improved functional use of RUE   Baseline 59%, 11/23/15- 6.82% impairment   Status Achieved     [facility] Long Term Goal  #7   Title Pt to demonstrate a 50% decrease in right axillary tightness to allow improved range of motion and improved comfort.    Baseline Pt continues to have tightness in axilla    Status Not Met            Plan - 10/03/16 1216    Clinical Impression Statement Pt reports she still has some tightness in her axilla with some visible swelling at top of expander.  Pt will pay attention to keeping compression bra over top of this area and will continue to wear her Circaid reduction kit at night, sleeve, glove, Flexitouch and exercise. She continues to have fullness in her forearm and will benefit  from a flat knit sleeve. It has been measured for her and pt will come to our clinic to make sure  it fits when it arrives. She feels she is able to contiue on her own at home and is ready to return to work.    Rehab Potential Good   Clinical Impairments Affecting Rehab Potential  full axillary node dissection, previous chemo and radiation.    PT Frequency 2x / week   PT Duration 4 weeks   PT Treatment/Interventions Manual techniques;Therapeutic exercise;Passive range of motion;Patient/family education;Compression bandaging;Vasopneumatic Device;ADLs/Self Care Home Management;DME Instruction;Therapeutic activities;Orthotic Fit/Training;Manual lymph drainage;Scar mobilization   PT Next Visit Plan discharge this episode    Consulted and Agree with Plan of Care Patient      Patient will benefit from skilled therapeutic intervention in order to improve the following deficits and impairments:  Decreased range of motion, Pain, Impaired UE functional use, Increased fascial restricitons, Decreased strength, Decreased knowledge of use of DME, Decreased knowledge of precautions, Decreased scar mobility, Increased edema  Visit Diagnosis: Postmastectomy lymphedema  Stiffness of right shoulder, not elsewhere classified  Chronic right shoulder pain     Problem List Patient Active Problem List   Diagnosis Date Noted  . Genetic testing 08/16/2015  . Iron deficiency anemia due to chronic blood loss 08/03/2015  . Family history of breast cancer   . Family history of colon cancer   . Family history of prostate cancer   . Breast cancer of lower-outer quadrant of right female breast ([place]) 07/29/2015  . Leiomyoma of uterus, unspecified 08/01/2012  . Irregular menstrual cycle 08/01/2012  . 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  . HTN (hypertension) 12/22/2010  . Anemia 12/22/2010  . Acid reflux 12/22/2010   PHYSICAL THERAPY DISCHARGE SUMMARY  Visits from Start of Care: 20  Current functional level related  to goals / functional outcomes: Pt feels that she has the tools and knowledge to control her lymphedema at home    Remaining deficits: Lymphedema in right arm, decrease at end range of shoulder flexion    Education / Equipment: Home exercise, use of compression for lymphedema management  Plan: Patient agrees to discharge.  Patient goals were partially met. Patient is being discharged due to                                                     ?????    Donato Heinz. Owens Shark PT  Norwood Levo 10/03/2016, 12:34 PM  [place], [place], [address] Phone: [phone]   Fax:  [phone]  Name: Melanie Moss MRN: [id number] Date of Birth: 1975/05/08

## 2016-10-08 ENCOUNTER — Other Ambulatory Visit: Payer: Self-pay | Admitting: Hematology

## 2016-10-08 ENCOUNTER — Telehealth: Payer: Self-pay | Admitting: *Deleted

## 2016-10-08 DIAGNOSIS — Z853 Personal history of malignant neoplasm of breast: Secondary | ICD-10-CM

## 2016-10-08 DIAGNOSIS — Z9011 Acquired absence of right breast and nipple: Secondary | ICD-10-CM

## 2016-10-08 NOTE — Telephone Encounter (Signed)
Spoke with pt and informed her of Dr. Ernestina Penna instructions.  Pt voiced understanding.

## 2016-10-08 NOTE — Telephone Encounter (Signed)
"  I usually see Dr.Feng every two weeks.  I'd like to see her Thursday to discuss future treatment plan.   I'm scheduled 10-11-2016 for only lab and my last treatment.  Return number [phone]."  Will notify provider of this request.

## 2016-10-08 NOTE — Telephone Encounter (Signed)
Thu,   Please let her know that I am out of office this week, and will see her at her last Herceptin on 7/12, I will send LOS  Truitt Merle MD

## 2016-10-11 ENCOUNTER — Ambulatory Visit: Payer: BLUE CROSS/BLUE SHIELD

## 2016-10-11 ENCOUNTER — Encounter: Payer: Self-pay | Admitting: *Deleted

## 2016-10-11 ENCOUNTER — Ambulatory Visit (HOSPITAL_BASED_OUTPATIENT_CLINIC_OR_DEPARTMENT_OTHER): Payer: BLUE CROSS/BLUE SHIELD

## 2016-10-11 ENCOUNTER — Other Ambulatory Visit (HOSPITAL_BASED_OUTPATIENT_CLINIC_OR_DEPARTMENT_OTHER): Payer: BLUE CROSS/BLUE SHIELD

## 2016-10-11 VITALS — BP 128/79 | HR 93 | Temp 98.4°F | Resp 18

## 2016-10-11 DIAGNOSIS — Z170 Estrogen receptor positive status [ER+]: Principal | ICD-10-CM

## 2016-10-11 DIAGNOSIS — C50511 Malignant neoplasm of lower-outer quadrant of right female breast: Secondary | ICD-10-CM

## 2016-10-11 DIAGNOSIS — Z5112 Encounter for antineoplastic immunotherapy: Secondary | ICD-10-CM

## 2016-10-11 DIAGNOSIS — D6481 Anemia due to antineoplastic chemotherapy: Secondary | ICD-10-CM | POA: Diagnosis not present

## 2016-10-11 DIAGNOSIS — D500 Iron deficiency anemia secondary to blood loss (chronic): Secondary | ICD-10-CM

## 2016-10-11 LAB — CBC WITH DIFFERENTIAL/PLATELET
BASO%: 0.7 % (ref 0.0–2.0)
Basophils Absolute: 0 10*3/uL (ref 0.0–0.1)
EOS%: 1.5 % (ref 0.0–7.0)
Eosinophils Absolute: 0.1 10*3/uL (ref 0.0–0.5)
HCT: 32.9 % — ABNORMAL LOW (ref 34.8–46.6)
HGB: 11.2 g/dL — ABNORMAL LOW (ref 11.6–15.9)
LYMPH%: 40.1 % (ref 14.0–49.7)
MCH: 31.1 pg (ref 25.1–34.0)
MCHC: 33.9 g/dL (ref 31.5–36.0)
MCV: 91.9 fL (ref 79.5–101.0)
MONO#: 0.5 10*3/uL (ref 0.1–0.9)
MONO%: 6.6 % (ref 0.0–14.0)
NEUT#: 3.6 10*3/uL (ref 1.5–6.5)
NEUT%: 51.1 % (ref 38.4–76.8)
Platelets: 243 10*3/uL (ref 145–400)
RBC: 3.59 10*6/uL — ABNORMAL LOW (ref 3.70–5.45)
RDW: 13.1 % (ref 11.2–14.5)
WBC: 7 10*3/uL (ref 3.9–10.3)
lymph#: 2.8 10*3/uL (ref 0.9–3.3)

## 2016-10-11 LAB — IRON AND TIBC
%SAT: 24 % (ref 21–57)
Iron: 53 ug/dL (ref 41–142)
TIBC: 221 ug/dL — ABNORMAL LOW (ref 236–444)
UIBC: 169 ug/dL (ref 120–384)

## 2016-10-11 LAB — COMPREHENSIVE METABOLIC PANEL
ALT: 18 U/L (ref 0–55)
AST: 23 U/L (ref 5–34)
Albumin: 3.9 g/dL (ref 3.5–5.0)
Alkaline Phosphatase: 50 U/L (ref 40–150)
Anion Gap: 9 mEq/L (ref 3–11)
BUN: 15.1 mg/dL (ref 7.0–26.0)
CO2: 23 mEq/L (ref 22–29)
Calcium: 9.5 mg/dL (ref 8.4–10.4)
Chloride: 108 mEq/L (ref 98–109)
Creatinine: 1 mg/dL (ref 0.6–1.1)
EGFR: 82 mL/min/{1.73_m2} — ABNORMAL LOW (ref >90)
Glucose: 100 mg/dl (ref 70–140)
Potassium: 3.7 mEq/L (ref 3.5–5.1)
Sodium: 140 mEq/L (ref 136–145)
Total Bilirubin: 0.31 mg/dL (ref 0.20–1.20)
Total Protein: 7.4 g/dL (ref 6.4–8.3)

## 2016-10-11 LAB — FERRITIN: Ferritin: 228 ng/ml (ref 9–269)

## 2016-10-11 MED ORDER — ACETAMINOPHEN 325 MG PO TABS
ORAL_TABLET | ORAL | Status: AC
  Filled 2016-10-11: qty 2

## 2016-10-11 MED ORDER — DIPHENHYDRAMINE HCL 25 MG PO CAPS
ORAL_CAPSULE | ORAL | Status: AC
  Filled 2016-10-11: qty 2

## 2016-10-11 MED ORDER — SODIUM CHLORIDE 0.9% FLUSH
10.0000 mL | INTRAVENOUS | Status: DC | PRN
  Administered 2016-10-11: 10 mL
  Filled 2016-10-11: qty 10

## 2016-10-11 MED ORDER — PERTUZUMAB CHEMO INJECTION 420 MG/14ML
420.0000 mg | Freq: Once | INTRAVENOUS | Status: AC
  Administered 2016-10-11: 420 mg via INTRAVENOUS
  Filled 2016-10-11: qty 14

## 2016-10-11 MED ORDER — TRASTUZUMAB CHEMO 150 MG IV SOLR
6.0000 mg/kg | Freq: Once | INTRAVENOUS | Status: AC
  Administered 2016-10-11: 420 mg via INTRAVENOUS
  Filled 2016-10-11: qty 20

## 2016-10-11 MED ORDER — SODIUM CHLORIDE 0.9 % IV SOLN
Freq: Once | INTRAVENOUS | Status: AC
  Administered 2016-10-11: 10:00:00 via INTRAVENOUS

## 2016-10-11 MED ORDER — HEPARIN SOD (PORK) LOCK FLUSH 100 UNIT/ML IV SOLN
500.0000 [IU] | Freq: Once | INTRAVENOUS | Status: AC | PRN
  Administered 2016-10-11: 500 [IU]
  Filled 2016-10-11: qty 5

## 2016-10-11 MED ORDER — ACETAMINOPHEN 325 MG PO TABS
650.0000 mg | ORAL_TABLET | Freq: Once | ORAL | Status: AC
  Administered 2016-10-11: 650 mg via ORAL

## 2016-10-11 MED ORDER — DIPHENHYDRAMINE HCL 25 MG PO CAPS
50.0000 mg | ORAL_CAPSULE | Freq: Once | ORAL | Status: AC
  Administered 2016-10-11: 50 mg via ORAL

## 2016-10-11 MED ORDER — SODIUM CHLORIDE 0.9 % IJ SOLN
10.0000 mL | INTRAMUSCULAR | Status: DC | PRN
  Administered 2016-10-11: 10 mL via INTRAVENOUS
  Filled 2016-10-11: qty 10

## 2016-10-11 NOTE — Patient Instructions (Signed)
[facility] Discharge Instructions for Patients Receiving Chemotherapy  Today you received the following chemotherapy agents Herceptin/Perjeta.  To help prevent nausea and vomiting after your treatment, we encourage you to take your nausea medication as directed.    If you develop nausea and vomiting that is not controlled by your nausea medication, call the clinic.   BELOW ARE SYMPTOMS THAT SHOULD BE REPORTED IMMEDIATELY:  *FEVER GREATER THAN 100.5 F  *CHILLS WITH OR WITHOUT FEVER  NAUSEA AND VOMITING THAT IS NOT CONTROLLED WITH YOUR NAUSEA MEDICATION  *UNUSUAL SHORTNESS OF BREATH  *UNUSUAL BRUISING OR BLEEDING  TENDERNESS IN MOUTH AND THROAT WITH OR WITHOUT PRESENCE OF ULCERS  *URINARY PROBLEMS  *BOWEL PROBLEMS  UNUSUAL RASH Items with * indicate a potential emergency and should be followed up as soon as possible.  Feel free to call the clinic you have any questions or concerns. The clinic phone number is [phone].  Please show the CHEMO ALERT CARD at check-in to the Emergency Department and triage nurse.   

## 2016-10-17 ENCOUNTER — Ambulatory Visit
Admission: RE | Admit: 2016-10-17 | Discharge: 2016-10-17 | Disposition: A | Discharge status: Home / Self Care | Payer: BLUE CROSS/BLUE SHIELD | Source: Ambulatory Visit | Attending: Hematology | Admitting: Hematology

## 2016-10-17 DIAGNOSIS — Z853 Personal history of malignant neoplasm of breast: Secondary | ICD-10-CM

## 2016-10-17 DIAGNOSIS — Z9011 Acquired absence of right breast and nipple: Secondary | ICD-10-CM

## 2016-10-17 HISTORY — DX: Personal history of antineoplastic chemotherapy: Z92.21

## 2016-10-17 HISTORY — DX: Personal history of irradiation: Z92.3

## 2016-10-21 HISTORY — PX: PORT-A-CATH REMOVAL: SHX5289

## 2016-10-22 ENCOUNTER — Encounter (HOSPITAL_COMMUNITY): Payer: Self-pay | Admitting: Cardiology

## 2016-10-22 ENCOUNTER — Ambulatory Visit (HOSPITAL_BASED_OUTPATIENT_CLINIC_OR_DEPARTMENT_OTHER)
Admission: RE | Admit: 2016-10-22 | Discharge: 2016-10-22 | Disposition: A | Discharge status: Home / Self Care | Payer: BLUE CROSS/BLUE SHIELD | Source: Ambulatory Visit | Attending: Cardiology | Admitting: Cardiology

## 2016-10-22 ENCOUNTER — Ambulatory Visit (HOSPITAL_COMMUNITY)
Admission: RE | Admit: 2016-10-22 | Discharge: 2016-10-22 | Disposition: A | Discharge status: Home / Self Care | Payer: BLUE CROSS/BLUE SHIELD | Source: Ambulatory Visit | Attending: Internal Medicine | Admitting: Internal Medicine

## 2016-10-22 VITALS — BP 148/92 | HR 77 | Wt 155.2 lb

## 2016-10-22 DIAGNOSIS — I10 Essential (primary) hypertension: Secondary | ICD-10-CM | POA: Diagnosis not present

## 2016-10-22 DIAGNOSIS — C50511 Malignant neoplasm of lower-outer quadrant of right female breast: Secondary | ICD-10-CM | POA: Diagnosis not present

## 2016-10-22 MED ORDER — HYDROCHLOROTHIAZIDE 25 MG PO TABS: 25.0000 mg | ORAL_TABLET | Freq: Every day | ORAL | 6 refills | Status: DC

## 2016-10-22 NOTE — Progress Notes (Signed)
  Echocardiogram 2D Echocardiogram has been performed.  Melanie Moss M 10/22/2016, 10:30 AM

## 2016-10-22 NOTE — Progress Notes (Signed)
Patient ID: Melanie Moss, female   DOB: [birth year], 41 y.o.   MRN: [id number] Oncologist: Dr Burr Medico  41 yo with history of breast cancer presents for cardio-oncology evaluation.  Breast cancer was diagnosed in 4/17, multifocal cancer in right breast.  One biopsied site was ER+/PR+/HER2+.  Other biopsied site was ER+/PR+/HER2-.  She had right mastectomy in 6/17.  She has started chemotherapy with Herceptin to continue x 1 year, has 1 more dose.  She has now completed radiation.   No chest pain, no exertional dyspnea.  BP remains elevated.  .   Labs (4/17): K 4, creatinine 0.8  PMH: 1. Breast cancer: Diagnosed in 4/17, multifocal cancer in right breast.  One biopsied site was ER+/PR+/HER2+.  Other biopsied site was ER+/PR+/HER2-. Right mastectomy in 6/17.  - Echo (4/17): EF 55-60%, GLS -19.9%, lateral s' 13.9 cm/sec - Echo (8/17): EF 65-70%, GLS -19%.  - Echo (12/17): EF 65-70%, GLS -18.2%, normal RV size and systolic function.  - Echo (3/18): EF 51-88%, normal diastolic function, GLS -41.6%, normal RV size and systolic function.  - Echo (7/18): EF 60-65%, GLS -21%.  2. Uterine fibroids 3. GERD 4. HTN  Social History   Social History  . Marital status: Single    Spouse name: N/A  . Number of children: 1  . Years of education: N/A   Occupational History  . Not on file.   Social History Main Topics  . Smoking status: Never Smoker  . Smokeless tobacco: Never Used  . Alcohol use No  . Drug use: No  . Sexual activity: Yes   Other Topics Concern  . Not on file   Social History Narrative  . No narrative on file   Family History  Problem Relation Age of Onset  . Hypertension Mother   . Hypertension Father   . Prostate cancer Father 53  . Breast cancer Maternal Aunt 30  . Breast cancer Paternal Aunt 75  . Cirrhosis Maternal Uncle   . Prostate cancer Paternal Uncle   . Throat cancer Maternal Aunt        non smoker  . Prostate cancer Paternal Uncle   . Colon cancer Cousin        maternal first cousin dx in his 24s  . Lung cancer Cousin        smoker   ROS: All systems reviewed and negative except as per HPI.   BP (!) 148/92   Pulse 77   Wt 155 lb 4 oz (70.4 kg)   LMP 10/07/2016 (Approximate)   SpO2 100%   BMI 27.50 kg/m  General: NAD Neck: JVP not elevated, no thyromegaly or thyroid nodule.  Lungs: CTAB CV: Nondisplaced PMI.  Heart regular S1/S2, no S3/S4, no murmur.  No peripheral edema.  No carotid bruit.  Normal pedal pulses.  Abdomen: Soft, nontender, no hepatosplenomegaly, no distention.  Skin: Intact without lesions or rashes.  Neurologic: Alert and oriented x 3.  Psych: Normal affect. Extremities: No clubbing or cyanosis.  HEENT: Normal.   Assessment/Plan: 1. Breast cancer:  She is getting Herceptin-based therapy for a year, has one more infusion.  I reviewed today's echo.  It was stable compared to prior echos.  No change to therapy.  As she only has 1 dose of Herceptin left, I will not repeat echo unless she develops concerning centuries.   2. HTN: BP continues to run high at times.  Will continue amlodipine 10 mg for now.  Add HCT 25 mg daily with  BMET in 2 wks.   She can followup with [place] prn.   Loralie Champagne 10/22/2016

## 2016-10-22 NOTE — Patient Instructions (Signed)
Start HCTZ (hydrochlorathiazide) 25 mg daily  Labs in 2 weeks  Your physician recommends that you schedule a follow-up appointment in: AS NEEDED

## 2016-10-25 NOTE — Progress Notes (Signed)
[place]  [phone] Fax:([phone]) [phone]  Clinic Follow Up Note   Patient Care Team: Lucianne Lei, MD as PCP - General (Family Medicine) Excell Seltzer, MD as Consulting Physician (General Surgery) Thea Silversmith, MD (Inactive) as Consulting Physician (Radiation Oncology) 11/01/2016  CHIEF COMPLAINTS:  Follow up right breast cancer  Oncology History   Breast cancer of lower-outer quadrant of right female breast [facility])   Staging form: Breast, AJCC 7th Edition     Clinical stage from 08/03/2015: Stage IA (T1c, N0, M0) - Unsigned     Pathologic stage from 09/29/2015: Stage IIIA (T3(m), N1a, cM0) - Signed by Truitt Merle, MD on 10/17/2015         Breast cancer of lower-outer quadrant of right female breast ([place])   07/25/2015 Mammogram    diagnostic mammogram and ultrasound showed a 11 mm mass in the 8:00 position of the right breast, small irregular lesion in the retroareolar regio of the right breast, contiguous with the dermis      07/26/2015 Initial Diagnosis    Breast cancer of lower-outer quadrant of right female breast ([place])      07/26/2015 Initial Biopsy    right breast needle core biopsy at 8:00 position (1), invasive ductal carcinoma, grade 3, 6:30 o'clock position invasive (2) and in situ ductal carcinoma, grade 1-2, lymphovascular invasion is identified      07/26/2015 Receptors her2    (1) ER 95% positive, PR 95% positive, Ki-67 30%, HER-2 positive with ratio 2.04, copy # 5.20, (2) ER 90% positive, PR 95% positive, Ki-67 10%, HER-2 negative.      08/02/2015 Imaging    breast MRI showed extensive mass  and non-mass enhancement in the lower outer quadrant of the right breast  spanning 13 cm, focal enhancement within the right nipple, at least 3-4 abnormal appearing right axillary lymph node.      09/29/2015 Surgery    right mastectomy and axillar node dissection       09/29/2015 Pathology Results    Right mastectomy and axillary node dissection  showed multifocal mammary carcinoma with ductal and lobular features, tumor size 13 cm, 2.1 cm, 1.9 cm, and 0.5 cm, grade 2-3, anterior margins were positive, 2 nodes with metastasis and one with isolated cells      11/01/2015 - 02/14/2016 Chemotherapy    TCHP every 3 weeks, for 6 cycles, followed by herceptin maintenance therapy for a total of one year       03/06/2016 - 11/01/2016 Antibody Plan    Maintenance Herceptin and perjeta, every 3 weeks      03/07/2016 - 04/27/2016 Radiation Therapy    03/07/16-04/27/16 Site/dose: 1) 64fd right chest wall/ 45 Gy in 25 fractions 2) Right mastectomy scar / 16 Gy in 8 fractions      05/21/2016 -  Anti-estrogen oral therapy    Tamoxifen 20 mg      10/17/2016 Mammogram    IMPRESSION: No mammographic evidence of malignancy. A result letter of this screening mammogram will be mailed directly to the patient.      10/22/2016 Echocardiogram    Impressions:  - Normal study. Stable GLPSS at -21%. LVEF 60-65%.       HISTORY OF PRESENTING ILLNESS (08/03/2015):  Melanie MPRINCELLA [id number].o. female is here because of Her newly diagnosed right breast cancer. She is accompanied by her fianc, sister and mother to our multidisciplinary breast clinic today.  She felt a breast lump and noticed mild intermittent breast pain 2 weeks  ago, no nipple discharge or skin change. She also has moderate fatigue and dyspnea on moderate to heavy exertion for a few months, no weight loss, no other pain or other symptoms.  She had normal mammgram 6 years ago, for a small lump which felt to be benign and no biopsy was done.    She has iregular period, twice a month, it lasts about 5-7 days, heavy for 3 days, she chanes 6-7 times a day. She was found to be anemic recently, and has started taking oral iron pill a few weeks ago, tolerates well.  GYN HISTORY  Menarchal: 10  LMP: 07/29/2015 Contraceptive: no  HRT: n/a  G4P1: 3 miscarrage, one 4 yo son, no plan to have more  children   CURRENT THERAPY: Maintenance Herceptin and perjeta, every 3 weeks, started on 03/06/2016 completed on 11/01/2016. Tamoxifen 20 mg started 05/21/16.   INTERIM HISTORY:  Melanie Moss returns for follow-up accompanied by family.  Today is her final Herceptin treatment. She denies any issue with the treatments. She still has some fatigue but is able to function. She has a low appetite. She denies any problems with Tamoxifen including hot flashes. She has gotten her menstrual cycle back. She denies taking any anti acid medications.   She returns to work on Monday.  MEDICAL HISTORY:  Past Medical History:  Diagnosis Date  . Anemia   . Anxiety   . Breast cancer of lower-outer quadrant of right female breast ([place]) 07/29/2015  . Family history of breast cancer   . Family history of colon cancer   . Family history of prostate cancer   . GERD (gastroesophageal reflux disease)    tums   . Gestational diabetes   . Hyperlipidemia   . Hypertension    not currently on medication  . Personal history of chemotherapy   . Personal history of radiation therapy   . Preterm labor     SURGICAL HISTORY: Past Surgical History:  Procedure Laterality Date  . AXILLARY LYMPH NODE DISSECTION Right 09/29/2015   Procedure: AXILLARY LYMPH NODE DISSECTION;  Surgeon: Excell Seltzer, MD;  Location: [place];  Service: General;  Laterality: Right;  . BREAST RECONSTRUCTION WITH PLACEMENT OF TISSUE EXPANDER AND FLEX HD (ACELLULAR HYDRATED DERMIS) Right 09/29/2015   Procedure: BREAST RECONSTRUCTION WITH PLACEMENT OF TISSUE EXPANDER AND FLEX HD (ACELLULAR HYDRATED DERMIS);  Surgeon: Loel Lofty Dillingham, DO;  Location: [place];  Service: Plastics;  Laterality: Right;  . CERVICAL CERCLAGE  02/19/2011   Procedure: CERCLAGE CERVICAL;  Surgeon: Agnes Lawrence, MD;  Location: [facility];  Service: Gynecology;  Laterality: N/A;  . DILATION AND CURETTAGE OF UTERUS    . MASTECTOMY     right 2017  . MASTECTOMY WITH AXILLARY  LYMPH NODE DISSECTION Right 09/29/2015  . PORTACATH PLACEMENT N/A 09/29/2015   Procedure: INSERTION PORT-A-CATH;  Surgeon: Excell Seltzer, MD;  Location: [place];  Service: General;  Laterality: N/A;  . SIMPLE MASTECTOMY WITH AXILLARY SENTINEL NODE BIOPSY Right 09/29/2015   Procedure: RIGHT TOTAL  MASTECTOMY WITH AXILLARY SENTINEL NODE BIOPSY;  Surgeon: Excell Seltzer, MD;  Location: [place];  Service: General;  Laterality: Right;    SOCIAL HISTORY: Social History   Social History  . Marital status: Single    Spouse name: N/A  . Number of children: 1  . Years of education: N/A   Occupational History  . Not on file.   Social History Main Topics  . Smoking status: Never Smoker  . Smokeless tobacco: Never Used  .  Alcohol use No  . Drug use: No  . Sexual activity: Yes   Other Topics Concern  . Not on file   Social History Narrative  . No narrative on file    FAMILY HISTORY: Family History  Problem Relation Age of Onset  . Hypertension Mother   . Hypertension Father   . Prostate cancer Father 31  . Breast cancer Maternal Aunt 67  . Breast cancer Paternal Aunt 38  . Cirrhosis Maternal Uncle   . Prostate cancer Paternal Uncle   . Throat cancer Maternal Aunt        non smoker  . Prostate cancer Paternal Uncle   . Colon cancer Cousin        maternal first cousin dx in his 30s  . Lung cancer Cousin        smoker    ALLERGIES:  has No Known Allergies.  MEDICATIONS:  Current Outpatient Prescriptions  Medication Sig Dispense Refill  . amLODipine (NORVASC) 10 MG tablet Take 1 tablet (10 mg total) by mouth at bedtime. 30 tablet 3  . B Complex-C (SUPER B COMPLEX PO) Take 1 tablet by mouth daily.    . diphenhydrAMINE (BENADRYL) 12.5 MG/5ML elixir Take 5 mLs (12.5 mg total) by mouth every 6 (six) hours as needed for itching. 120 mL 0  . Elastic Bandages & Supports (KNEE COMPRESSION SLEEVE/L/XL) MISC   0  . ferrous sulfate 325 (65 FE) MG tablet Take 325 mg by mouth 2 (two)  times daily with a meal.    . hydrochlorothiazide (HYDRODIURIL) 25 MG tablet Take 1 tablet (25 mg total) by mouth daily. 30 tablet 6  . hydrocortisone 2.5 % cream Apply topically 2 (two) times daily. 30 g 1  . ibuprofen (ADVIL,MOTRIN) 200 MG tablet Take 200 mg by mouth every 6 (six) hours as needed.    Marland Kitchen LORazepam (ATIVAN) 0.5 MG tablet Take 1 tablet (0.5 mg total) by mouth at bedtime as needed for anxiety (nausea). 30 tablet 0  . Multiple Vitamin (MULTIVITAMIN) tablet Take 1 tablet by mouth daily.    . ondansetron (ZOFRAN-ODT) 4 MG disintegrating tablet Take 4 mg by mouth every 8 (eight) hours as needed for nausea or vomiting. Reported on 11/01/2015  0  . polyethylene glycol (MIRALAX / GLYCOLAX) packet Take 17 g by mouth daily as needed for mild constipation. 14 each 0  . senna (SENOKOT) 8.6 MG TABS tablet Take 1 tablet (8.6 mg total) by mouth 2 (two) times daily. (Patient taking differently: Take 1 tablet by mouth daily as needed. ) 120 each 0  . tamoxifen (NOLVADEX) 20 MG tablet Take 1 tablet (20 mg total) by mouth daily. 30 tablet 5  . venlafaxine XR (EFFEXOR XR) 37.5 MG 24 hr capsule Take 1 capsule (37.5 mg total) by mouth daily with breakfast. 30 capsule 1  . potassium chloride SA (K-DUR,KLOR-CON) 20 MEQ tablet Take 1 tablet (20 mEq total) by mouth daily. 20 tablet 0   No current facility-administered medications for this visit.    Facility-Administered Medications Ordered in Other Visits  Medication Dose Route Frequency Provider Last Rate Last Dose  . 0.9 %  sodium chloride infusion   Intravenous Once Truitt Merle, MD      . alteplase (CATHFLO ACTIVASE) injection 2 mg  2 mg Intracatheter Once PRN Truitt Merle, MD      . heparin lock flush 100 unit/mL  250 Units Intracatheter Once PRN Truitt Merle, MD      . heparin lock flush 100  unit/mL  500 Units Intravenous Once PRN Truitt Merle, MD      . sodium chloride 0.9 % injection 10 mL  10 mL Intracatheter PRN Truitt Merle, MD      . sodium chloride 0.9 %  injection 10 mL  10 mL Intravenous PRN Truitt Merle, MD      . sodium chloride 0.9 % injection 3 mL  3 mL Intravenous Once PRN Truitt Merle, MD      . sodium chloride flush (NS) 0.9 % injection 10 mL  10 mL Intravenous PRN Truitt Merle, MD        REVIEW OF SYSTEMS:   Constitutional: Denies fevers, chills  (+) improved Hot flashes, night sweats (+) fatigue and tiredness (+)low appetite Eyes: Denies blurriness of vision, double vision or watery eyes Ears, nose, mouth, throat, and face: Denies mucositis or sore throat Respiratory: Denies cough, dyspnea or wheezes  Cardiovascular: Denies palpitation, chest discomfort or lower extremity swelling Gastrointestinal:  Denies nausea, heartburn or change in bowel habits Skin: Denies abnormal skin rashes Lymphatics: Denies new lymphadenopathy or easy bruising  Neurological:Denies numbness, tingling or new weaknesses  Behavioral/Psych: Mood is stable, no new changes  All other systems were reviewed with the patient and are negative.  PHYSICAL EXAMINATION:  ECOG PERFORMANCE STATUS: 1   Vitals:   11/01/16 0826  BP: 136/87  Pulse: 91  Resp: 20  Temp: 97.9 F (36.6 C)   Filed Weights   11/01/16 0826  Weight: 150 lb 14.4 oz (68.4 kg)     GENERAL:alert, no distress and comfortable SKIN: skin color, texture, turgor are normal, Several scattered acne-like rash in her upper front chest.  EYES: normal, conjunctiva are pink and non-injected, sclera clear OROPHARYNX:no exudate, no erythema and lips, buccal mucosa, and tongue normal  NECK: supple, thyroid normal size, non-tender, without nodularity LYMPH:  no palpable lymphadenopathy in the cervical, axillary or inguinal LUNGS: clear to auscultation and percussion with normal breathing effort HEART: regular rate & rhythm and no murmurs and no lower extremity edema ABDOMEN:abdomen soft, non-tender and normal bowel sounds Musculoskeletal:no cyanosis of digits and no clubbing  PSYCH: alert & oriented x 3 with  fluent speech NEURO: no focal motor/sensory deficits Breasts: Breast inspection showed them to be symmetrical with no skin change or nipple discharge.  Status post right mastectomy and tissue expander placement, surgical scar has healed well. (+) Diffuse skin pigmentation of the right breast secondary to radiation, no skin ulcers or discharge. Palpation of the left breast and bilateral axilla showed no palpable mass.  LABORATORY DATA:  I have reviewed the data as listed CBC Latest Ref Rng & Units 11/01/2016 10/11/2016 09/20/2016  WBC 3.9 - 10.3 10e3/uL 7.5 7.0 8.0  Hemoglobin 11.6 - 15.9 g/dL 11.4(L) 11.2(L) 10.6(L)  Hematocrit 34.8 - 46.6 % 34.1(L) 32.9(L) 32.1(L)  Platelets 145 - 400 10e3/uL 208 243 186    CMP Latest Ref Rng & Units 11/01/2016 10/11/2016 09/20/2016  Glucose 70 - 140 mg/dl 106 100 101  BUN 7.0 - 26.0 mg/dL 14.6 15.1 23.1  Creatinine 0.6 - 1.1 mg/dL 1.0 1.0 0.9  Sodium 136 - 145 mEq/L 140 140 141  Potassium 3.5 - 5.1 mEq/L 3.2(L) 3.7 3.7  Chloride 101 - 111 mmol/L - - -  CO2 22 - 29 mEq/L '27 23 23  '$ Calcium 8.4 - 10.4 mg/dL 9.7 9.5 9.3  Total Protein 6.4 - 8.3 g/dL 7.5 7.4 7.4  Total Bilirubin 0.20 - 1.20 mg/dL 0.48 0.31 0.28  Alkaline Phos 40 -  150 U/L 53 50 51  AST 5 - 34 U/L '24 23 20  '$ ALT 0 - 55 U/L '19 18 13    '$ PATHOLOGY: Diagnosis 09/29/2015 1. Lymph node, sentinel, biopsy, Right axillary #1 - ONE BENIGN LYMPH NODE WITH NO TUMOR SEEN (0/1). 2. Lymph node, sentinel, biopsy, Right axillary #2 - ONE LYMPH NODE POSITIVE FOR METASTATIC MAMMARY CARCINOMA (1/1). 3. Lymph node, sentinel, biopsy, Right axillary #4 - ONE BENIGN LYMPH NODE WITH NO TUMOR SEEN (0/1). 4. 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 5. 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 - LARGEST FOCUS OF INVASIVE MAMMARY CARCINOMA IS GRADE 2, SHOWS  ASSOCIATED LOBULAR CARCINOMA IN SITU AND MEASURES 13 CM IN GREATEST DIMENSION. - SECOND LARGEST FOCUS SHOWS INVASIVE GRADE 3 MAMMARY CARCINOMA AND SPANS 2.1 CM IN GREATEST DIMENSION. - THIRD LARGEST FOCUS SHOWS INVASIVE GRADE 2 MAMMARY CARCINOMA WITH LOBULAR CARCINOMA IN SITU AND MEASURES 1.9 CM IN GREATEST DIMENSION. - SMALLEST FOCUS SHOWS INVASIVE GRADE 3 MAMMARY CARCINOMA AND MEASURES 0.5 CM IN GREATEST DIMENSION. - ANTERIOR INFERIOR SOFT TISSUE MARGIN DEMONSTRATES BROAD POSITIVITY FOR INVASIVE MAMMARY CARCINOMA. - OTHER MARGINS ARE NEGATIVE. - SEE ONCOLOGY TEMPLATE. 6. Lymph node, sentinel, biopsy, Right axillary #3 - ONE BENIGN LYMPH NODE WITH NO TUMOR SEEN (0/1). 7. Lymph node, sentinel, biopsy, Right axillary #6 - ONE LYMPH NODE WITH ISOLATED TUMOR CELLS. 8. Lymph nodes, regional resection, Right axillary contents - ELEVEN BENIGN LYMPH NODES WITH NO TUMOR SEEN (0/11). Specimen, including laterality and lymph node sampling (sentinel, non-sentinel): Right breast with right sentinel and non-sentinel lymph nodes. Procedure: Right modified mastectomy with sentinel lymph node biopsy and axillary dissection. Histologic type: Multifocal mammary carcinoma with ductal and lobular features. Largest focus (13 cm): Grade: 2. Tubule formation: 3. Nuclear pleomorphism: 2. Mitotic: 1. Second largest focus (2.1 cm): Grade: 3. Tubule formation: 3. Nuclear pleomorphism: 2. Mitotic: 3. Third largest focus (1.9 cm): Grade: 2. Tubule formation: 3. Nuclear pleomorphism: 2. Mitotic: 1. Smallest focus (0.5 cm): Grade: 3. Tubule formation: 3. Nuclear pleomorphism: 2. Mitotic: 3. Tumor size (gross measurements): 13 cm, 2.1 cm, 1.9 cm and 0.5 cm. Margins: Invasive, distance to closest margin: Invasive mammary carcinoma broadly involves the anterior inferior soft tissue margin. In-situ, distance to closest margin: Margin uninvolved by in situ carcinoma. If margin positive, focally or  broadly: Broadly positive. Lymphovascular invasion: Yes, identified. Ductal carcinoma in situ: Not identified. Grade: Not applicable. Extensive intraductal component: Not applicable. Lobular neoplasia: Yes, lobular carcinoma in situ is present. Tumor focality: Multifocal. Treatment effect: Not applicable. If present, treatment effect in breast tissue, lymph nodes or both: Not applicable. Extent of tumor: Skin: Not involved. Nipple: Not involved. Skeletal muscle: Not received. Lymph nodes: Examined: 6 Sentinel. 11 Non-sentinel. 17 Total. Lymph nodes with metastasis: 2 with metastasis and 1 with isolated tumor cells. Isolated tumor cells (< 0.2 mm): 1. Micrometastasis: (> 0.2 mm and < 2.0 mm): 0. Macrometastasis: (> 2.0 mm): 2. Extracapsular extension: Not identified.  Breast prognostic profile: Performed on previous case, [id number]. Right 8 o'clock biopsy 12 cm from the nipple: Estrogen receptor: 95%, positive. Progesterone receptor: 95%, positive. Her-2 neu: 2.04 ratio, positive. Ki-67: 30%. Right breast needle core biopsy 6:30 o'clock position 1 cm from the nipple: Estrogen receptor: 90%, positive. Progesterone receptor: 95%, positive. Her-2 neu: 1.39 ratio, negative. Ki-67: 10%. Non-neoplastic breast: No significant non-neoplastic breast findings.  TNM: pT3(m), pN1a. Comments:  A cytokeratin AE1/AE3 immunohistochemical stain is performed on eight blocks containing lymph node tissue (eight stains total). The morphology coupled with the staining pattern is consistent with the above findings. An E-cadherin immunohistochemical stain is also performed on four blocks from the right simple mastectomy specimen. The staining pattern coupled with the morphology is consistent with the above findings. As there was Her-2 neu positivity in one of the biopsies and both biopsies were positive for estrogen receptor, a repeat breast prognostic profile will not be performed unless  otherwise requested. Dr. Lyndon Code has seen selected slides 5E, 11F, 5G, 5J, 5N, 5O, and 5P with agreement that the tumor is a mulitfocal invasive mammary carcinoma with a mixed ductal and lobular phenotype. Dr. Tresa Moore has also seen a selected slide (5P) with agreement of the positive anterior inferior margin. (RH:ecj 10/03/2015)   RADIOGRAPHIC STUDIES: I have personally reviewed the radiological images as listed and agreed with the findings in the report. No new scans  CT chest, abdomen and pelvis  10/27/2015 IMPRESSION: Status post right mastectomy and right axillary lymph node dissection.  No findings specific for metastatic disease.  Scattered hepatic cysts measuring up to 12 mm. A single 8 mm hypoenhancing lesion is technically indeterminate, but also likely benign, although motion degraded. Consider attention on follow-up.  Bone scan 10/28/2015 IMPRESSION: Small focus of increased radiotracer uptake within the right lateral skull at the region of the right temporal bone. CT of the head may be considered for further evaluation.  Otherwise no evidence of osseous metastatic disease in the axial skeleton.  CT head with and without contrast 11/07/2015 IMPRESSION: 1. Indeterminate 10-11 mm area of heterogeneous bone mineralization along the right sphenoid wing which may correspond to the recent bone scan finding. This might be physiologic. A Repeat CT head (e.g. in 8-12 weeks, Noncontrast should suffice but with and without contrast could be done if repeat brain staging is desired) may be most valuable to evaluate stability. 2. No CT evidence of metastatic disease to the brain. Mild nonspecific white matter changes.  Echo 04/10/2016 Impressions:  - Normal LV size with EF 65-70%. Strain as above. Normal RV size   and systolic function. No significant valvular abnormalities.  Mammogram 10/17/2016 IMPRESSION: No mammographic evidence of malignancy. A result letter of  this screening mammogram will be mailed directly to the patient.  ASSESSMENT & PLAN:  41 y.o. African-American female, premenopausal, presented with a palpable right breast mass.  1. 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 Her initial biopsy showed one triple positive disease, the other was ER/PR positive and HER2 Negative disease. -Her staging scans were negative for distant metastasis. -We previously discussed that HER-2 positive breast cancer are more aggressive, with a high risk for recurrence after surgery, especially in the setting of locally advanced stage. I recommend her to have adjuvant chemotherapy TCHP (docetaxel, carboplatin, Herceptin, pejeta) every 3 weeks for total of 6 cycles, followed by Herceptin maintenance therapy to complete 1 year treatment. -She completed adjuvant radiation therapy by Dr. Sondra Come on 04/27/2016. Tolerated well. - she is completing maintenance Herceptin and pejeta today -She has started adjuvant antiestrogen therapy with tamoxifen, tolerating well overall, with moderate hot flashes. She could not tolerate Effexor. - the benefit  of ovarian suppresson is less clear in HER2 positive disease. -We again discussed  a newly FDA approved oral HER2 antibody Neratinib. She agrees to try. I discussed the side effects including diarrhea in the first 2 months. She can start when she receives it. I will follow up in 6 weeks to follow up on her side effects -Labs reviewed and adequate for final Herceptin, Perjete treatment today.  -Continue breast cancer surveillance. She is clinically doing well, lab and exam are unremarkable, no clinical concern for  recurrence. She is tolerating Tamoxifen well, she will continue for up to10 years or until she is post menopause. I refilled for her today. - Her potassium is low today at 3.2. I will prescribe her some potassium supplements and I strongly encouraged her to eat more foods high in potassium.  2. Iron deficient anemia, and chemo induced anemia  -Her lab tests showed a moderate anemia. -Her anemia previously resolved after IV Feraheme, slightly worse after she started chemotherapy. -I previously encouraged her to continue oral iron pill -Her repeated iron level and a ferritin have normalized.  -She has required blood transfusion in the past -Hemoglobin 10.5 previously, improved from December 2017, we'll continue monitoring. -Iron lab will be done next time, she has felt more tired and fatigued lately. Will monitor for more sings on anemia.   - Her menstrual cycle has return. I strongly encouraged her to take her iron pills twice daily for the next 3 months then once daily   3. Genetic -Given her young age and positive family history, we strongly recommend her to [facility] genetic counseling for genetic testing to ruled out inheritable breast cancer syndrome. She agrees. -Her genetic testing was normal  4.  HTN -She was noticed to have significant elevated blood pressure lately  -She has previously seen her primary care physician, and her blood pressure medication has been adjusted, including HCTZ  5. Right arm lymphedema  -Secondary to breast surgery and lymph node biopsy -improved  -She is using compression cast for her right arm. -She has a pump from PT and she says it swells more on the pump, will F/u with PT for more help  6. Hot flashes and mood swings. -The Effexor made her feel loopy and she has stopped taking it. -I previously started her on Neurontin for her nocturnal hot flash and peripheral neuropathy, but she only took it once due to feeling loopy. -Hot flashes improved on Effexor and  stopped -continue Effexor   Plan -last Herceptin and Perjeta today  -message to Dr.Hoxworth about port removal - f/u and lab in 6 weeks - she will start neratinib when she receives it, prescription was sent out to the - order potassium pills -refill Tamoxifen   I spent 20 minutes counseling the patient face to face. The total time spent in the appointment was 25 minutes and more than 50% was on counseling.    Truitt Merle, MD 11/01/2016    This document serves as a record of services personally performed by Truitt Merle, MD. It was created on her behalf by Brandt Loosen, a trained medical scribe. The creation of this record is based on the scribe's personal observations and the provider's statements to them. This document has been checked and approved by the attending provider.

## 2016-11-01 ENCOUNTER — Encounter: Payer: Self-pay | Admitting: *Deleted

## 2016-11-01 ENCOUNTER — Ambulatory Visit (HOSPITAL_BASED_OUTPATIENT_CLINIC_OR_DEPARTMENT_OTHER): Payer: BLUE CROSS/BLUE SHIELD | Admitting: Hematology

## 2016-11-01 ENCOUNTER — Ambulatory Visit: Payer: BLUE CROSS/BLUE SHIELD

## 2016-11-01 ENCOUNTER — Other Ambulatory Visit (HOSPITAL_BASED_OUTPATIENT_CLINIC_OR_DEPARTMENT_OTHER): Payer: BLUE CROSS/BLUE SHIELD

## 2016-11-01 ENCOUNTER — Telehealth: Payer: Self-pay | Admitting: Hematology

## 2016-11-01 ENCOUNTER — Ambulatory Visit (HOSPITAL_BASED_OUTPATIENT_CLINIC_OR_DEPARTMENT_OTHER): Payer: BLUE CROSS/BLUE SHIELD

## 2016-11-01 VITALS — BP 136/87 | HR 91 | Temp 97.9°F | Resp 20 | Ht 63.0 in | Wt 150.9 lb

## 2016-11-01 DIAGNOSIS — I890 Lymphedema, not elsewhere classified: Secondary | ICD-10-CM | POA: Diagnosis not present

## 2016-11-01 DIAGNOSIS — Z170 Estrogen receptor positive status [ER+]: Principal | ICD-10-CM

## 2016-11-01 DIAGNOSIS — D509 Iron deficiency anemia, unspecified: Secondary | ICD-10-CM | POA: Diagnosis not present

## 2016-11-01 DIAGNOSIS — Z5112 Encounter for antineoplastic immunotherapy: Secondary | ICD-10-CM

## 2016-11-01 DIAGNOSIS — C50511 Malignant neoplasm of lower-outer quadrant of right female breast: Secondary | ICD-10-CM | POA: Diagnosis not present

## 2016-11-01 DIAGNOSIS — Z79810 Long term (current) use of selective estrogen receptor modulators (SERMs): Secondary | ICD-10-CM | POA: Diagnosis not present

## 2016-11-01 DIAGNOSIS — D500 Iron deficiency anemia secondary to blood loss (chronic): Secondary | ICD-10-CM

## 2016-11-01 DIAGNOSIS — D6481 Anemia due to antineoplastic chemotherapy: Secondary | ICD-10-CM

## 2016-11-01 DIAGNOSIS — I10 Essential (primary) hypertension: Secondary | ICD-10-CM

## 2016-11-01 LAB — COMPREHENSIVE METABOLIC PANEL
ALT: 19 U/L (ref 0–55)
AST: 24 U/L (ref 5–34)
Albumin: 4.1 g/dL (ref 3.5–5.0)
Alkaline Phosphatase: 53 U/L (ref 40–150)
Anion Gap: 11 mEq/L (ref 3–11)
BUN: 14.6 mg/dL (ref 7.0–26.0)
CO2: 27 mEq/L (ref 22–29)
Calcium: 9.7 mg/dL (ref 8.4–10.4)
Chloride: 103 mEq/L (ref 98–109)
Creatinine: 1 mg/dL (ref 0.6–1.1)
EGFR: 76 mL/min/{1.73_m2} — ABNORMAL LOW (ref >90)
Glucose: 106 mg/dl (ref 70–140)
Potassium: 3.2 mEq/L — ABNORMAL LOW (ref 3.5–5.1)
Sodium: 140 mEq/L (ref 136–145)
Total Bilirubin: 0.48 mg/dL (ref 0.20–1.20)
Total Protein: 7.5 g/dL (ref 6.4–8.3)

## 2016-11-01 LAB — CBC WITH DIFFERENTIAL/PLATELET
BASO%: 0.3 % (ref 0.0–2.0)
Basophils Absolute: 0 10*3/uL (ref 0.0–0.1)
EOS%: 0.9 % (ref 0.0–7.0)
Eosinophils Absolute: 0.1 10*3/uL (ref 0.0–0.5)
HCT: 34.1 % — ABNORMAL LOW (ref 34.8–46.6)
HGB: 11.4 g/dL — ABNORMAL LOW (ref 11.6–15.9)
LYMPH%: 45.5 % (ref 14.0–49.7)
MCH: 30.9 pg (ref 25.1–34.0)
MCHC: 33.4 g/dL (ref 31.5–36.0)
MCV: 92.4 fL (ref 79.5–101.0)
MONO#: 0.5 10*3/uL (ref 0.1–0.9)
MONO%: 7.2 % (ref 0.0–14.0)
NEUT#: 3.5 10*3/uL (ref 1.5–6.5)
NEUT%: 46.1 % (ref 38.4–76.8)
Platelets: 208 10*3/uL (ref 145–400)
RBC: 3.69 10*6/uL — ABNORMAL LOW (ref 3.70–5.45)
RDW: 12.5 % (ref 11.2–14.5)
WBC: 7.5 10*3/uL (ref 3.9–10.3)
lymph#: 3.4 10*3/uL — ABNORMAL HIGH (ref 0.9–3.3)

## 2016-11-01 MED ORDER — TAMOXIFEN CITRATE 20 MG PO TABS: 20.0000 mg | ORAL_TABLET | Freq: Every day | ORAL | 5 refills | Status: DC

## 2016-11-01 MED ORDER — DIPHENHYDRAMINE HCL 25 MG PO CAPS
50.0000 mg | ORAL_CAPSULE | Freq: Once | ORAL | Status: AC
  Administered 2016-11-01: 50 mg via ORAL

## 2016-11-01 MED ORDER — SODIUM CHLORIDE 0.9 % IJ SOLN
10.0000 mL | INTRAMUSCULAR | Status: DC | PRN
  Administered 2016-11-01: 10 mL via INTRAVENOUS
  Filled 2016-11-01: qty 10

## 2016-11-01 MED ORDER — TRASTUZUMAB CHEMO 150 MG IV SOLR
6.0000 mg/kg | Freq: Once | INTRAVENOUS | Status: AC
  Administered 2016-11-01: 420 mg via INTRAVENOUS
  Filled 2016-11-01: qty 20

## 2016-11-01 MED ORDER — SODIUM CHLORIDE 0.9 % IV SOLN
420.0000 mg | Freq: Once | INTRAVENOUS | Status: AC
  Administered 2016-11-01: 420 mg via INTRAVENOUS
  Filled 2016-11-01: qty 14

## 2016-11-01 MED ORDER — POTASSIUM CHLORIDE CRYS ER 20 MEQ PO TBCR: 20.0000 meq | EXTENDED_RELEASE_TABLET | Freq: Every day | ORAL | 0 refills | Status: DC

## 2016-11-01 MED ORDER — NERATINIB MALEATE 40 MG PO TABS: 240.0000 mg | ORAL_TABLET | Freq: Every day | ORAL | 2 refills | Status: DC

## 2016-11-01 MED ORDER — DIPHENHYDRAMINE HCL 25 MG PO CAPS
ORAL_CAPSULE | ORAL | Status: AC
  Filled 2016-11-01: qty 2

## 2016-11-01 MED ORDER — ACETAMINOPHEN 325 MG PO TABS
650.0000 mg | ORAL_TABLET | Freq: Once | ORAL | Status: AC
  Administered 2016-11-01: 650 mg via ORAL

## 2016-11-01 MED ORDER — SODIUM CHLORIDE 0.9 % IV SOLN
Freq: Once | INTRAVENOUS | Status: AC
  Administered 2016-11-01: 10:00:00 via INTRAVENOUS

## 2016-11-01 MED ORDER — ACETAMINOPHEN 325 MG PO TABS
ORAL_TABLET | ORAL | Status: AC
  Filled 2016-11-01: qty 2

## 2016-11-01 MED ORDER — SODIUM CHLORIDE 0.9% FLUSH
10.0000 mL | INTRAVENOUS | Status: DC | PRN
  Administered 2016-11-01: 10 mL
  Filled 2016-11-01: qty 10

## 2016-11-01 MED ORDER — HEPARIN SOD (PORK) LOCK FLUSH 100 UNIT/ML IV SOLN
500.0000 [IU] | Freq: Once | INTRAVENOUS | Status: AC | PRN
  Administered 2016-11-01: 500 [IU]
  Filled 2016-11-01: qty 5

## 2016-11-01 NOTE — Progress Notes (Signed)
Patient declined 23min post observation. Stable upon discharge, no complaints.

## 2016-11-01 NOTE — Patient Instructions (Signed)
[facility] Discharge Instructions for Patients Receiving Chemotherapy  Today you received the following chemotherapy agents Herceptin/Perjeta.  To help prevent nausea and vomiting after your treatment, we encourage you to take your nausea medication as directed.    If you develop nausea and vomiting that is not controlled by your nausea medication, call the clinic.   BELOW ARE SYMPTOMS THAT SHOULD BE REPORTED IMMEDIATELY:  *FEVER GREATER THAN 100.5 F  *CHILLS WITH OR WITHOUT FEVER  NAUSEA AND VOMITING THAT IS NOT CONTROLLED WITH YOUR NAUSEA MEDICATION  *UNUSUAL SHORTNESS OF BREATH  *UNUSUAL BRUISING OR BLEEDING  TENDERNESS IN MOUTH AND THROAT WITH OR WITHOUT PRESENCE OF ULCERS  *URINARY PROBLEMS  *BOWEL PROBLEMS  UNUSUAL RASH Items with * indicate a potential emergency and should be followed up as soon as possible.  Feel free to call the clinic you have any questions or concerns. The clinic phone number is [phone].  Please show the CHEMO ALERT CARD at check-in to the Emergency Department and triage nurse.   

## 2016-11-01 NOTE — Telephone Encounter (Signed)
Gave patient avs report and appointments for August.  °

## 2016-11-01 NOTE — Patient Instructions (Signed)

## 2016-11-02 ENCOUNTER — Ambulatory Visit (HOSPITAL_COMMUNITY)
Admission: RE | Admit: 2016-11-02 | Discharge: 2016-11-02 | Disposition: A | Discharge status: Home / Self Care | Payer: BLUE CROSS/BLUE SHIELD | Source: Ambulatory Visit | Attending: Internal Medicine | Admitting: Internal Medicine

## 2016-11-02 ENCOUNTER — Telehealth: Payer: Self-pay | Admitting: Pharmacist

## 2016-11-02 DIAGNOSIS — I10 Essential (primary) hypertension: Secondary | ICD-10-CM | POA: Diagnosis present

## 2016-11-02 LAB — BASIC METABOLIC PANEL
Anion gap: 7 (ref 5–15)
BUN: 12 mg/dL (ref 6–20)
CO2: 27 mmol/L (ref 22–32)
Calcium: 9.7 mg/dL (ref 8.9–10.3)
Chloride: 101 mmol/L (ref 101–111)
Creatinine, Ser: 1.07 mg/dL — ABNORMAL HIGH (ref 0.44–1.00)
GFR calc Af Amer: >60 mL/min (ref >60)
GFR calc non Af Amer: >60 mL/min (ref >60)
Glucose, Bld: 106 mg/dL — ABNORMAL HIGH (ref 65–99)
Potassium: 3.2 mmol/L — ABNORMAL LOW (ref 3.5–5.1)
Sodium: 135 mmol/L (ref 135–145)

## 2016-11-02 NOTE — Telephone Encounter (Signed)
Oral Chemotherapy Pharmacist Encounter  Received new Nerlynx prescription for the adjuvant maintenance treatment of hormone-receptor positive, Her-2 positive breast cancer after completion of chemotherapy and 1 year of Herceptin.  11/01/16 labs reviewed, OK for treatment  Current medication list in Epic assessed, no significant DDIs with Nerlynx identified  Nerlynx is a limited distribution medication. Also noted patient with [facility]. Nerlynx prescription has been faxed to [facility] in [place], [place] (ph: [phone], option 2).  I spoke with patient for overview of new oral chemotherapy medication: Nerlynx. Pt is doing well. She understands [facility] will be reaching out to her in the next week with more information about Nerlynx acquisition.  Counseled patient on administration, dosing, side effects, safe handling, and monitoring. Patient will take Nerlynx '40mg'$  tablets, 6 tablets by mouth once daily. She will avoid grapefruit and grapefruit juice..  Side effects include but not limited to: diarrhea, decreased appetite, fatigue. Patient has anti-diarrheal on hand. Per MD, patient will not take loperamide anti-diarrheal prophylaxis as her experience is this causes severe constipation. Patient will take loperamide at the first sign of diarrhea and call the office.  Ms. Ansley voiced understanding and appreciation.   All questions answered.  Oral Oncology Clinic will continue to follow for insurance authorization, copayment issues, and start date.  Thank you,  Johny Drilling, PharmD, BCPS, BCOP 11/02/2016  11:58 AM Oral Oncology Clinic [phone]

## 2016-11-04 ENCOUNTER — Encounter: Payer: Self-pay | Admitting: Hematology

## 2016-11-05 ENCOUNTER — Other Ambulatory Visit (HOSPITAL_COMMUNITY): Payer: BLUE CROSS/BLUE SHIELD

## 2016-11-05 ENCOUNTER — Telehealth (HOSPITAL_COMMUNITY): Payer: Self-pay | Admitting: *Deleted

## 2016-11-05 MED ORDER — POTASSIUM CHLORIDE CRYS ER 20 MEQ PO TBCR: 40.0000 meq | EXTENDED_RELEASE_TABLET | Freq: Every day | ORAL | 3 refills | Status: DC

## 2016-11-05 NOTE — Telephone Encounter (Signed)
Basic metabolic panel  Order: [id number]  Status:  Final result Visible to patient:  Yes (MyChart) Dx:  Essential hypertension  Notes recorded by Darron Doom, RN on 11/05/2016 at 10:02 AM EDT Called and spoke with patient, she is agreeable with plan. Medication increased, lab order placed, and appointment scheduled. ------  Notes recorded by Larey Dresser, MD on 11/02/2016 at 9:44 PM EDT Add additional 20 mEq KCl to her daily regimen. BMET 10 days.

## 2016-11-05 NOTE — Telephone Encounter (Signed)
Call from [place] with [facility]. Nerlynx requires prior auth. Pharmacy will initiate this.

## 2016-11-07 ENCOUNTER — Encounter: Payer: Self-pay | Admitting: *Deleted

## 2016-11-15 ENCOUNTER — Ambulatory Visit (HOSPITAL_COMMUNITY)
Admission: RE | Admit: 2016-11-15 | Discharge: 2016-11-15 | Disposition: A | Discharge status: Home / Self Care | Payer: BLUE CROSS/BLUE SHIELD | Source: Ambulatory Visit | Attending: Cardiology | Admitting: Cardiology

## 2016-11-15 DIAGNOSIS — Z170 Estrogen receptor positive status [ER+]: Secondary | ICD-10-CM | POA: Insufficient documentation

## 2016-11-15 DIAGNOSIS — C50511 Malignant neoplasm of lower-outer quadrant of right female breast: Secondary | ICD-10-CM | POA: Diagnosis not present

## 2016-11-15 LAB — BASIC METABOLIC PANEL
Anion gap: 12 (ref 5–15)
BUN: 14 mg/dL (ref 6–20)
CO2: 25 mmol/L (ref 22–32)
Calcium: 9.7 mg/dL (ref 8.9–10.3)
Chloride: 101 mmol/L (ref 101–111)
Creatinine, Ser: 1.18 mg/dL — ABNORMAL HIGH (ref 0.44–1.00)
GFR calc Af Amer: >60 mL/min (ref >60)
GFR calc non Af Amer: 56 mL/min — ABNORMAL LOW (ref >60)
Glucose, Bld: 103 mg/dL — ABNORMAL HIGH (ref 65–99)
Potassium: 3.8 mmol/L (ref 3.5–5.1)
Sodium: 138 mmol/L (ref 135–145)

## 2016-12-04 NOTE — Progress Notes (Signed)
[facility]  [phone] [phone]  Clinic Follow Up Note   Patient Care Team: Lucianne Lei, MD as PCP - General (Family Medicine) Excell Seltzer, MD as Consulting Physician (General Surgery) Thea Silversmith, MD (Inactive) as Consulting Physician (Radiation Oncology) 12/07/2016  CHIEF COMPLAINTS:  Follow up right breast cancer  Oncology History   Breast cancer of lower-outer quadrant of right female breast [facility])   Staging form: Breast, AJCC 7th Edition     Clinical stage from 08/03/2015: Stage IA (T1c, N0, M0) - Unsigned     Pathologic stage from 09/29/2015: Stage IIIA (T3(m), N1a, cM0) - Signed by Truitt Merle, MD on 10/17/2015         Breast cancer of lower-outer quadrant of right female breast ([place])   07/25/2015 Mammogram    diagnostic mammogram and ultrasound showed a 11 mm mass in the 8:00 position of the right breast, small irregular lesion in the retroareolar regio of the right breast, contiguous with the dermis      07/26/2015 Initial Diagnosis    Breast cancer of lower-outer quadrant of right female breast ([place])      07/26/2015 Initial Biopsy    right breast needle core biopsy at 8:00 position (1), invasive ductal carcinoma, grade 3, 6:30 o'clock position invasive (2) and in situ ductal carcinoma, grade 1-2, lymphovascular invasion is identified      07/26/2015 Receptors her2    (1) ER 95% positive, PR 95% positive, Ki-67 30%, HER-2 positive with ratio 2.04, copy # 5.20, (2) ER 90% positive, PR 95% positive, Ki-67 10%, HER-2 negative.      08/02/2015 Imaging    breast MRI showed extensive mass  and non-mass enhancement in the lower outer quadrant of the right breast  spanning 13 cm, focal enhancement within the right nipple, at least 3-4 abnormal appearing right axillary lymph node.      09/29/2015 Surgery    right mastectomy and axillar node dissection       09/29/2015 Pathology Results    Right mastectomy and axillary node dissection  showed multifocal mammary carcinoma with ductal and lobular features, tumor size 13 cm, 2.1 cm, 1.9 cm, and 0.5 cm, grade 2-3, anterior margins were positive, 2 nodes with metastasis and one with isolated cells      11/01/2015 - 02/14/2016 Chemotherapy    TCHP every 3 weeks, for 6 cycles, followed by herceptin maintenance therapy for a total of one year       03/06/2016 - 11/01/2016 Antibody Plan    Maintenance Herceptin and perjeta, every 3 weeks      03/07/2016 - 04/27/2016 Radiation Therapy    03/07/16-04/27/16 Site/dose: 1) 73fd right chest wall/ 45 Gy in 25 fractions 2) Right mastectomy scar / 16 Gy in 8 fractions      05/21/2016 -  Anti-estrogen oral therapy    Tamoxifen 20 mg      10/17/2016 Mammogram    IMPRESSION: No mammographic evidence of malignancy. A result letter of this screening mammogram will be mailed directly to the patient.      10/22/2016 Echocardiogram    Impressions:  - Normal study. Stable GLPSS at -21%. LVEF 60-65%.       HISTORY OF PRESENTING ILLNESS (08/03/2015):  Melanie MBRITTANIA SUDBECK89y.o. female is here because of Her newly diagnosed right breast cancer. She is accompanied by her fianc, sister and mother to our multidisciplinary breast clinic today.  She felt a breast lump and noticed mild intermittent breast pain 2 weeks  ago, no nipple discharge or skin change. She also has moderate fatigue and dyspnea on moderate to heavy exertion for a few months, no weight loss, no other pain or other symptoms.  She had normal mammgram 6 years ago, for a small lump which felt to be benign and no biopsy was done.    She has iregular period, twice a month, it lasts about 5-7 days, heavy for 3 days, she chanes 6-7 times a day. She was found to be anemic recently, and has started taking oral iron pill a few weeks ago, tolerates well.  GYN HISTORY  Menarchal: 10  LMP: 07/29/2015 Contraceptive: no  HRT: n/a  G4P1: 3 miscarrage, one 25 yo son, no plan to have more  children   CURRENT THERAPY:  1.Tamoxifen 20 mg started 05/21/16. 2. Neratinib '240mg'$  daily started on 11/09/2016   INTERIM HISTORY:  Melanie Moss returns for follow-up. Overall, things are going well for her. Last time I saw her I prescribed her Neranitib, it did cause diarrhea but she has still been taking this. She has approximately 5 bowel movements a day and overall they have remained unchanged since she began taking this medications. She has been taking this for approximately one month now, and she reports that her refill should arrive in the mail today. Pt has been taking Immodium twice a day, sometimes up to four times a day, but this has not improved her diarrhea.   She also has been losing weight recently, but she notes that her plastic surgeon wanted her to lose 15lbs any ways. She has lost approximately 10lbs.   She is still taking her K+ pill as prescribed and increasing her potassium rich food intake.   She has overall been doing well otherwise and tolerating Tamoxifen well.   Her lymphedema of the right arm has still been intermittent, she has been using compression sleeves and manual pump at home which has been controlling this well. Her hot flashes have resolved, but she has not been taking her Effexor from her last visit.     MEDICAL HISTORY:  Past Medical History:  Diagnosis Date  . Anemia   . Anxiety   . Breast cancer of lower-outer quadrant of right female breast ([place]) 07/29/2015  . Family history of breast cancer   . Family history of colon cancer   . Family history of prostate cancer   . GERD (gastroesophageal reflux disease)    tums   . Gestational diabetes   . Hyperlipidemia   . Hypertension    not currently on medication  . Personal history of chemotherapy   . Personal history of radiation therapy   . Preterm labor    SURGICAL HISTORY: Past Surgical History:  Procedure Laterality Date  . AXILLARY LYMPH NODE DISSECTION Right 09/29/2015   Procedure: AXILLARY LYMPH  NODE DISSECTION;  Surgeon: Excell Seltzer, MD;  Location: [place];  Service: General;  Laterality: Right;  . BREAST RECONSTRUCTION WITH PLACEMENT OF TISSUE EXPANDER AND FLEX HD (ACELLULAR HYDRATED DERMIS) Right 09/29/2015   Procedure: BREAST RECONSTRUCTION WITH PLACEMENT OF TISSUE EXPANDER AND FLEX HD (ACELLULAR HYDRATED DERMIS);  Surgeon: Loel Lofty Dillingham, DO;  Location: [facility];  Service: Plastics;  Laterality: Right;  . CERVICAL CERCLAGE  02/19/2011   Procedure: CERCLAGE CERVICAL;  Surgeon: Agnes Lawrence, MD;  Location: [facility];  Service: Gynecology;  Laterality: N/A;  . DILATION AND CURETTAGE OF UTERUS    . MASTECTOMY     right 2017  . MASTECTOMY WITH AXILLARY LYMPH  NODE DISSECTION Right 09/29/2015  . PORTACATH PLACEMENT N/A 09/29/2015   Procedure: INSERTION PORT-A-CATH;  Surgeon: Excell Seltzer, MD;  Location: [facility];  Service: General;  Laterality: N/A;  . SIMPLE MASTECTOMY WITH AXILLARY SENTINEL NODE BIOPSY Right 09/29/2015   Procedure: RIGHT TOTAL  MASTECTOMY WITH AXILLARY SENTINEL NODE BIOPSY;  Surgeon: Excell Seltzer, MD;  Location: [place];  Service: General;  Laterality: Right;   SOCIAL HISTORY: Social History   Social History  . Marital status: Single    Spouse name: N/A  . Number of children: 1  . Years of education: N/A   Occupational History  . Not on file.   Social History Main Topics  . Smoking status: Never Smoker  . Smokeless tobacco: Never Used  . Alcohol use No  . Drug use: No  . Sexual activity: Yes   Other Topics Concern  . Not on file   Social History Narrative  . No narrative on file   FAMILY HISTORY: Family History  Problem Relation Age of Onset  . Hypertension Mother   . Hypertension Father   . Prostate cancer Father 35  . Breast cancer Maternal Aunt 16  . Breast cancer Paternal Aunt 67  . Cirrhosis Maternal Uncle   . Prostate cancer Paternal Uncle   . Throat cancer Maternal Aunt        non smoker  . Prostate cancer Paternal Uncle     . Colon cancer Cousin        maternal first cousin dx in his 26s  . Lung cancer Cousin        smoker   ALLERGIES:  has No Known Allergies.  MEDICATIONS:  Current Outpatient Prescriptions  Medication Sig Dispense Refill  . amLODipine (NORVASC) 10 MG tablet Take 1 tablet (10 mg total) by mouth at bedtime. 30 tablet 3  . B Complex-C (SUPER B COMPLEX PO) Take 1 tablet by mouth daily.    . diphenhydrAMINE (BENADRYL) 12.5 MG/5ML elixir Take 5 mLs (12.5 mg total) by mouth every 6 (six) hours as needed for itching. 120 mL 0  . diphenoxylate-atropine (LOMOTIL) 2.5-0.025 MG tablet Take 1-2 tablets by mouth 4 (four) times daily as needed for diarrhea or loose stools. 30 tablet 1  . Elastic Bandages & Supports (KNEE COMPRESSION SLEEVE/L/XL) MISC   0  . ferrous sulfate 325 (65 FE) MG tablet Take 325 mg by mouth 2 (two) times daily with a meal.    . hydrochlorothiazide (HYDRODIURIL) 25 MG tablet Take 1 tablet (25 mg total) by mouth daily. 30 tablet 6  . hydrocortisone 2.5 % cream Apply topically 2 (two) times daily. 30 g 1  . ibuprofen (ADVIL,MOTRIN) 200 MG tablet Take 200 mg by mouth every 6 (six) hours as needed.    . Multiple Vitamin (MULTIVITAMIN) tablet Take 1 tablet by mouth daily.    . Neratinib Maleate (NERLYNX) 40 MG tablet Take 6 tablets (240 mg total) by mouth daily. Take with food. 180 tablet 2  . ondansetron (ZOFRAN-ODT) 4 MG disintegrating tablet Take 4 mg by mouth every 8 (eight) hours as needed for nausea or vomiting. Reported on 11/01/2015  0  . polyethylene glycol (MIRALAX / GLYCOLAX) packet Take 17 g by mouth daily as needed for mild constipation. 14 each 0  . potassium chloride SA (K-DUR,KLOR-CON) 20 MEQ tablet Take 2 tablets (40 mEq total) by mouth daily. 60 tablet 3  . senna (SENOKOT) 8.6 MG TABS tablet Take 1 tablet (8.6 mg total) by mouth 2 (two) times daily. (  Patient taking differently: Take 1 tablet by mouth daily as needed. ) 120 each 0  . tamoxifen (NOLVADEX) 20 MG tablet Take  1 tablet (20 mg total) by mouth daily. 30 tablet 5   No current facility-administered medications for this visit.    Facility-Administered Medications Ordered in Other Visits  Medication Dose Route Frequency Provider Last Rate Last Dose  . 0.9 %  sodium chloride infusion   Intravenous Once Truitt Merle, MD      . alteplase (CATHFLO ACTIVASE) injection 2 mg  2 mg Intracatheter Once PRN Truitt Merle, MD      . heparin lock flush 100 unit/mL  250 Units Intracatheter Once PRN Truitt Merle, MD      . heparin lock flush 100 unit/mL  500 Units Intravenous Once PRN Truitt Merle, MD      . sodium chloride 0.9 % injection 10 mL  10 mL Intracatheter PRN Truitt Merle, MD      . sodium chloride 0.9 % injection 10 mL  10 mL Intravenous PRN Truitt Merle, MD      . sodium chloride 0.9 % injection 3 mL  3 mL Intravenous Once PRN Truitt Merle, MD      . sodium chloride flush (NS) 0.9 % injection 10 mL  10 mL Intravenous PRN Truitt Merle, MD        REVIEW OF SYSTEMS:   Constitutional: Denies fevers, chills  (+) improved Hot flashes  Eyes:Denies blurry vision, double vision or watery eyes Ears, nose, mouth, throat, and face: Denies mucositis or sore throat Respiratory: Denies cough, dyspnea or wheezes  Cardiovascular: Denies palpitation, chest discomfort or lower extremity swelling Gastrointestinal:  Denies nausea, heartburn, (+) diarrhea with loose BM 5 times a day  Skin: Denies abnormal skin rashes Lymphatics: Denies new lymphadenopathy or easy bruising  Neurological:Denies numbness, tingling or new weaknesses  Behavioral/Psych: Mood is stable, no new changes  All other systems were reviewed with the patient and are negative.  PHYSICAL EXAMINATION:  ECOG PERFORMANCE STATUS: 1   Vitals:   12/07/16 1305  BP: 128/88  Pulse: 86  Resp: 20  Temp: 98 F (36.7 C)  SpO2: 100%   Filed Weights   12/07/16 1305  Weight: 146 lb 12.8 oz (66.6 kg)     GENERAL:alert, no distress and comfortable SKIN: skin color, texture, turgor  are normal, Several scattered acne-like rash in her upper front chest.  EYES: normal, conjunctiva are pink and non-injected, sclera clear OROPHARYNX:no exudate, no erythema and lips, buccal mucosa, and tongue normal  NECK: supple, thyroid normal size, non-tender, without nodularity LYMPH:  no palpable lymphadenopathy in the cervical, axillary or inguinal LUNGS: clear to auscultation and percussion with normal breathing effort HEART: regular rate & rhythm and no murmurs and no lower extremity edema ABDOMEN:abdomen soft, non-tender and normal bowel sounds Musculoskeletal:no cyanosis of digits and no clubbing  PSYCH: alert & oriented x 3 with fluent speech NEURO: no focal motor/sensory deficits Breasts: Breast inspection showed them to be symmetrical with no skin change or nipple discharge.  Status post right mastectomy and tissue expander placement, surgical scar has healed well. (+) Diffuse skin pigmentation of the right breast secondary to radiation, no skin ulcers or discharge. Palpation of the left breast and bilateral axilla showed no palpable mass.  LABORATORY DATA:  I have reviewed the data as listed CBC Latest Ref Rng & Units 12/07/2016 11/01/2016 10/11/2016  WBC 3.9 - 10.3 10e3/uL 5.9 7.5 7.0  Hemoglobin 11.6 - 15.9 g/dL 10.1(L) 11.4(L) 11.2(L)  Hematocrit 34.8 - 46.6 % 30.8(L) 34.1(L) 32.9(L)  Platelets 145 - 400 10e3/uL 211 208 243    CMP Latest Ref Rng & Units 12/07/2016 11/15/2016 11/02/2016  Glucose 70 - 140 mg/dl 78 103(H) 106(H)  BUN 7.0 - 26.0 mg/dL 9.'7 14 12  '$ Creatinine 0.6 - 1.1 mg/dL 0.9 1.18(H) 1.07(H)  Sodium 136 - 145 mEq/L 143 138 135  Potassium 3.5 - 5.1 mEq/L 3.7 3.8 3.2(L)  Chloride 101 - 111 mmol/L - 101 101  CO2 22 - 29 mEq/L '29 25 27  '$ Calcium 8.4 - 10.4 mg/dL 9.4 9.7 9.7  Total Protein 6.4 - 8.3 g/dL 6.7 - -  Total Bilirubin 0.20 - 1.20 mg/dL 0.37 - -  Alkaline Phos 40 - 150 U/L 48 - -  AST 5 - 34 U/L 55(H) - -  ALT 0 - 55 U/L 88(H) - -     PATHOLOGY: Diagnosis 09/29/2015 1. Lymph node, sentinel, biopsy, Right axillary #1 - ONE BENIGN LYMPH NODE WITH NO TUMOR SEEN (0/1). 2. Lymph node, sentinel, biopsy, Right axillary #2 - ONE LYMPH NODE POSITIVE FOR METASTATIC MAMMARY CARCINOMA (1/1). 3. Lymph node, sentinel, biopsy, Right axillary #4 - ONE BENIGN LYMPH NODE WITH NO TUMOR SEEN (0/1). 4. 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 5. 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 - LARGEST FOCUS OF INVASIVE MAMMARY CARCINOMA IS GRADE 2, SHOWS ASSOCIATED LOBULAR CARCINOMA IN SITU AND MEASURES 13 CM IN GREATEST DIMENSION. - SECOND LARGEST FOCUS SHOWS INVASIVE GRADE 3 MAMMARY CARCINOMA AND SPANS 2.1 CM IN GREATEST DIMENSION. - THIRD LARGEST FOCUS SHOWS INVASIVE GRADE 2 MAMMARY CARCINOMA WITH LOBULAR CARCINOMA IN SITU AND MEASURES 1.9 CM IN GREATEST DIMENSION. - SMALLEST FOCUS SHOWS INVASIVE GRADE 3 MAMMARY CARCINOMA AND MEASURES 0.5 CM IN GREATEST DIMENSION. - ANTERIOR INFERIOR SOFT TISSUE MARGIN DEMONSTRATES BROAD POSITIVITY FOR INVASIVE MAMMARY CARCINOMA. - OTHER MARGINS ARE NEGATIVE. - SEE ONCOLOGY TEMPLATE. 6. Lymph node, sentinel, biopsy, Right axillary #3 - ONE BENIGN LYMPH NODE WITH NO TUMOR SEEN (0/1). 7. Lymph node, sentinel, biopsy, Right axillary #6 - ONE LYMPH NODE WITH ISOLATED TUMOR CELLS. 8. Lymph nodes, regional resection, Right axillary contents - ELEVEN BENIGN LYMPH NODES WITH NO TUMOR SEEN (0/11). Specimen, including laterality and lymph node sampling (sentinel, non-sentinel): Right breast with right sentinel and non-sentinel lymph nodes. Procedure: Right modified mastectomy with sentinel lymph node biopsy and axillary dissection. Histologic type: Multifocal mammary carcinoma with ductal and lobular features. Largest focus (13 cm): Grade:  2. Tubule formation: 3. Nuclear pleomorphism: 2. Mitotic: 1. Second largest focus (2.1 cm): Grade: 3. Tubule formation: 3. Nuclear pleomorphism: 2. Mitotic: 3. Third largest focus (1.9 cm): Grade: 2. Tubule formation: 3. Nuclear pleomorphism: 2. Mitotic: 1. Smallest focus (0.5 cm): Grade: 3. Tubule formation: 3. Nuclear pleomorphism: 2. Mitotic: 3. Tumor size (gross measurements): 13 cm, 2.1 cm, 1.9 cm and 0.5 cm. Margins: Invasive, distance to closest margin: Invasive mammary carcinoma broadly involves the anterior inferior soft tissue margin. In-situ, distance to closest margin: Margin uninvolved by in situ carcinoma. If margin positive, focally or broadly: Broadly positive. Lymphovascular invasion: Yes, identified. Ductal carcinoma in situ: Not identified. Grade: Not applicable. Extensive intraductal component: Not applicable. Lobular neoplasia: Yes, lobular carcinoma in situ is present. Tumor focality: Multifocal. Treatment effect: Not applicable. If present, treatment effect in breast tissue, lymph nodes or both: Not applicable. Extent of tumor: Skin: Not involved. Nipple:  Not involved. Skeletal muscle: Not received. Lymph nodes: Examined: 6 Sentinel. 11 Non-sentinel. 17 Total. Lymph nodes with metastasis: 2 with metastasis and 1 with isolated tumor cells. Isolated tumor cells (< 0.2 mm): 1. Micrometastasis: (> 0.2 mm and < 2.0 mm): 0. Macrometastasis: (> 2.0 mm): 2. Extracapsular extension: Not identified.  Breast prognostic profile: Performed on previous case, [id number]. Right 8 o'clock biopsy 12 cm from the nipple: Estrogen receptor: 95%, positive. Progesterone receptor: 95%, positive. Her-2 neu: 2.04 ratio, positive. Ki-67: 30%. Right breast needle core biopsy 6:30 o'clock position 1 cm from the nipple: Estrogen receptor: 90%, positive. Progesterone receptor: 95%, positive. Her-2 neu: 1.39 ratio, negative. Ki-67: 10%. Non-neoplastic breast: No  significant non-neoplastic breast findings.  TNM: pT3(m), pN1a. Comments: A cytokeratin AE1/AE3 immunohistochemical stain is performed on eight blocks containing lymph node tissue (eight stains total). The morphology coupled with the staining pattern is consistent with the above findings. An E-cadherin immunohistochemical stain is also performed on four blocks from the right simple mastectomy specimen. The staining pattern coupled with the morphology is consistent with the above findings. As there was Her-2 neu positivity in one of the biopsies and both biopsies were positive for estrogen receptor, a repeat breast prognostic profile will not be performed unless otherwise requested. Dr. Lyndon Code has seen selected slides 5E, 60F, 5G, 5J, 5N, 5O, and 5P with agreement that the tumor is a mulitfocal invasive mammary carcinoma with a mixed ductal and lobular phenotype. Dr. Tresa Moore has also seen a selected slide (5P) with agreement of the positive anterior inferior margin. (RH:ecj 10/03/2015)   RADIOGRAPHIC STUDIES: I have personally reviewed the radiological images as listed and agreed with the findings in the report. No new scans  CT chest, abdomen and pelvis  10/27/2015 IMPRESSION: Status post right mastectomy and right axillary lymph node dissection.  No findings specific for metastatic disease.  Scattered hepatic cysts measuring up to 12 mm. A single 8 mm hypoenhancing lesion is technically indeterminate, but also likely benign, although motion degraded. Consider attention on follow-up.  Bone scan 10/28/2015 IMPRESSION: Small focus of increased radiotracer uptake within the right lateral skull at the region of the right temporal bone. CT of the head may be considered for further evaluation.  Otherwise no evidence of osseous metastatic disease in the axial skeleton.  CT head with and without contrast 11/07/2015 IMPRESSION: 1. Indeterminate 10-11 mm area of heterogeneous bone  mineralization along the right sphenoid wing which may correspond to the recent bone scan finding. This might be physiologic. A Repeat CT head (e.g. in 8-12 weeks, Noncontrast should suffice but with and without contrast could be done if repeat brain staging is desired) may be most valuable to evaluate stability. 2. No CT evidence of metastatic disease to the brain. Mild nonspecific white matter changes.  Echo 04/10/2016 Impressions:  - Normal LV size with EF 65-70%. Strain as above. Normal RV size   and systolic function. No significant valvular abnormalities.  Mammogram 10/17/2016 IMPRESSION: No mammographic evidence of malignancy. A result letter of this screening mammogram will be mailed directly to the patient.  ASSESSMENT & PLAN:  41 y.o. African-American female, premenopausal, presented with a palpable right breast mass.  1. 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  Her initial biopsy showed one triple positive disease, the other was ER/PR positive and HER2 Negative disease. -Her staging scans were negative for distant metastasis. -We previously discussed that HER-2 positive breast cancer are more aggressive, with a high risk for recurrence after surgery, especially in the setting of locally advanced stage.  -she completed adjuvant chemotherapy TCHP (docetaxel, carboplatin, Herceptin, pejeta) every 3 weeks for total of 6 cycles, followed by Herceptin maintenance therapy to complete 1 year treatment. -She completed adjuvant radiation therapy by Dr. Roselind Messier on 04/27/2016. Tolerated well. -She has started adjuvant antiestrogen therapy with tamoxifen, tolerating well  overall, with moderate hot flashes but improved over time. She could not tolerate Effexor. - the benefit of ovarian suppresson is less clear in HER2 positive disease. -she has started Neratinib with main side effect of moderate diarrhea. She has been using Imodium, I called in Lomotil for her as needed for diarrhea. She will continue Neratinib -Continue breast cancer surveillance. She is clinically doing well, lab and exam are unremarkable, no clinical concern for recurrence. She is tolerating Tamoxifen well, she will continue for up to 10 years or until she is post menopause. I refilled for her today. -I previously prescribed her potassium supplements and encouraged her to eat more foods rich in potassium for hypokalemia.  -Her potassium, 12/07/16, has improved. I urged her to continue taking her supplements and continue eating potassium-rich foods.   2. Iron deficient anemia, and anemia of chronic disease  -Her lab tests showed a mild to moderate anemia. -Her anemia previously resolved after IV Feraheme, slightly worse after she started chemotherapy. -I previously encouraged her to continue oral iron pill -Her repeated iron level and a ferritin have normalized.  -She has required blood transfusion in the past - Her menstrual cycle has returned. I previously encouraged her to take her iron pills twice daily for the next 3 months then once daily. -Her menses have been very heavy, she has been taking Iron supplements.  -Today's iron studies showed normal ferritin, low TIBC, consistent with anemia of chronic disease  -will give one dose iv feraheme   3. Genetic -Given her young age and positive family history, we strongly recommend her to [facility] genetic counseling for genetic testing to ruled out inheritable breast cancer syndrome. She agrees. -Her genetic testing was normal  4.  HTN -She was noticed to have significant elevated blood pressure lately  -She has previously seen her primary care  physician, and her blood pressure medication has been adjusted, including HCTZ  5. Right arm lymphedema  -Secondary to breast surgery and lymph node biopsy -improved  -She is using compression cast for her right arm. -She has a pump from PT and she says it swells more on the pump, will F/u with PT for more help -Continue with compression sleeves and manual pump.   6. Hot flashes and mood swings. -The Effexor made her feel loopy and she has stopped taking it. -I previously started her on Neurontin for her nocturnal hot flash and peripheral neuropathy, but she only took it once due to feeling loopy. -Hot flashes improved on Effexor and stopped -Hot flashes have now resolved  7. Diarrhea -Related to Neratinib. I will place her on Lomotil to help aid with this.    Plan -Labs reviewed, will set up iv feraheme once   -continue tamoxifen and neratinib  -Labs and f/u in 2 months -Mammogram next June -Begin Lomotil as needed for diarrhea    I spent 25 minutes counseling  the patient face to face. The total time spent in the appointment was 30 minutes and more than 50% was on counseling.   This document serves as a record of services personally performed by Truitt Merle, MD. It was created on her behalf by Reola Mosher, a trained medical scribe. The creation of this record is based on the scribe's personal observations and the provider's statements to them. This document has been checked and approved by the attending provider.   Truitt Merle, MD 12/07/2016

## 2016-12-07 ENCOUNTER — Telehealth: Payer: Self-pay | Admitting: Hematology

## 2016-12-07 ENCOUNTER — Ambulatory Visit: Payer: BLUE CROSS/BLUE SHIELD

## 2016-12-07 ENCOUNTER — Encounter: Payer: Self-pay | Admitting: Hematology

## 2016-12-07 ENCOUNTER — Ambulatory Visit (HOSPITAL_BASED_OUTPATIENT_CLINIC_OR_DEPARTMENT_OTHER): Payer: BLUE CROSS/BLUE SHIELD | Admitting: Hematology

## 2016-12-07 ENCOUNTER — Other Ambulatory Visit (HOSPITAL_BASED_OUTPATIENT_CLINIC_OR_DEPARTMENT_OTHER): Payer: BLUE CROSS/BLUE SHIELD

## 2016-12-07 VITALS — BP 128/88 | HR 86 | Temp 98.0°F | Resp 20 | Ht 63.0 in | Wt 146.8 lb

## 2016-12-07 DIAGNOSIS — C50511 Malignant neoplasm of lower-outer quadrant of right female breast: Secondary | ICD-10-CM

## 2016-12-07 DIAGNOSIS — Z170 Estrogen receptor positive status [ER+]: Secondary | ICD-10-CM

## 2016-12-07 DIAGNOSIS — D509 Iron deficiency anemia, unspecified: Secondary | ICD-10-CM | POA: Diagnosis not present

## 2016-12-07 DIAGNOSIS — D500 Iron deficiency anemia secondary to blood loss (chronic): Secondary | ICD-10-CM

## 2016-12-07 DIAGNOSIS — I10 Essential (primary) hypertension: Secondary | ICD-10-CM

## 2016-12-07 DIAGNOSIS — C773 Secondary and unspecified malignant neoplasm of axilla and upper limb lymph nodes: Secondary | ICD-10-CM

## 2016-12-07 LAB — CBC WITH DIFFERENTIAL/PLATELET
BASO%: 0.5 % (ref 0.0–2.0)
Basophils Absolute: 0 10e3/uL (ref 0.0–0.1)
EOS%: 3.5 % (ref 0.0–7.0)
Eosinophils Absolute: 0.2 10e3/uL (ref 0.0–0.5)
HCT: 30.8 % — ABNORMAL LOW (ref 34.8–46.6)
HGB: 10.1 g/dL — ABNORMAL LOW (ref 11.6–15.9)
LYMPH%: 35.9 % (ref 14.0–49.7)
MCH: 30.3 pg (ref 25.1–34.0)
MCHC: 32.8 g/dL (ref 31.5–36.0)
MCV: 92.5 fL (ref 79.5–101.0)
MONO#: 0.4 10e3/uL (ref 0.1–0.9)
MONO%: 7.1 % (ref 0.0–14.0)
NEUT#: 3.1 10e3/uL (ref 1.5–6.5)
NEUT%: 53 % (ref 38.4–76.8)
Platelets: 211 10e3/uL (ref 145–400)
RBC: 3.33 10e6/uL — ABNORMAL LOW (ref 3.70–5.45)
RDW: 13.3 % (ref 11.2–14.5)
WBC: 5.9 10e3/uL (ref 3.9–10.3)
lymph#: 2.1 10e3/uL (ref 0.9–3.3)

## 2016-12-07 LAB — IRON AND TIBC
%SAT: 18 % — ABNORMAL LOW (ref 21–57)
Iron: 40 ug/dL — ABNORMAL LOW (ref 41–142)
TIBC: 219 ug/dL — ABNORMAL LOW (ref 236–444)
UIBC: 179 ug/dL (ref 120–384)

## 2016-12-07 LAB — COMPREHENSIVE METABOLIC PANEL
ALT: 88 U/L — ABNORMAL HIGH (ref 0–55)
AST: 55 U/L — ABNORMAL HIGH (ref 5–34)
Albumin: 3.6 g/dL (ref 3.5–5.0)
Alkaline Phosphatase: 48 U/L (ref 40–150)
Anion Gap: 7 mEq/L (ref 3–11)
BUN: 9.7 mg/dL (ref 7.0–26.0)
CO2: 29 mEq/L (ref 22–29)
Calcium: 9.4 mg/dL (ref 8.4–10.4)
Chloride: 106 mEq/L (ref 98–109)
Creatinine: 0.9 mg/dL (ref 0.6–1.1)
EGFR: >90 mL/min/{1.73_m2} (ref >90)
Glucose: 78 mg/dl (ref 70–140)
Potassium: 3.7 mEq/L (ref 3.5–5.1)
Sodium: 143 mEq/L (ref 136–145)
Total Bilirubin: 0.37 mg/dL (ref 0.20–1.20)
Total Protein: 6.7 g/dL (ref 6.4–8.3)

## 2016-12-07 LAB — FERRITIN: Ferritin: 178 ng/mL (ref 9–269)

## 2016-12-07 MED ORDER — DIPHENOXYLATE-ATROPINE 2.5-0.025 MG PO TABS: 1.0000 | ORAL_TABLET | Freq: Four times a day (QID) | ORAL | 1 refills | Status: DC | PRN

## 2016-12-07 MED ORDER — HEPARIN SOD (PORK) LOCK FLUSH 100 UNIT/ML IV SOLN
500.0000 [IU] | Freq: Once | INTRAVENOUS | Status: DC | PRN
  Filled 2016-12-07: qty 5

## 2016-12-07 MED ORDER — SODIUM CHLORIDE 0.9 % IJ SOLN
10.0000 mL | INTRAMUSCULAR | Status: DC | PRN
  Filled 2016-12-07: qty 10

## 2016-12-07 NOTE — Addendum Note (Signed)
Addended by: Truitt Merle on: 12/07/2016 11:18 PM   Modules accepted: Orders

## 2016-12-07 NOTE — Telephone Encounter (Signed)
Scheduled appt per 8/17 los - patient is aware of appt date and time and reminder letter sent in the mail.

## 2016-12-07 NOTE — Progress Notes (Signed)
Patient reported to flush room to have labs drawn as scheduled. However, patient reports port has been removed. Flush appointment cancelled and patient returned to lab for peripheral draw.

## 2016-12-10 ENCOUNTER — Telehealth: Payer: Self-pay | Admitting: Hematology

## 2016-12-10 ENCOUNTER — Telehealth: Payer: Self-pay | Admitting: *Deleted

## 2016-12-10 NOTE — Telephone Encounter (Signed)
-----   Message from Truitt Merle, MD sent at 12/08/2016  8:40 PM EDT ----- Please let pt know and I will set up iv iron once, thanks.   Truitt Merle  12/08/2016

## 2016-12-10 NOTE — Telephone Encounter (Signed)
Spoke with pt and informed pt re:  Iron level results.  Informed pt that Dr. Burr Medico would like for pt to receive IV Iron x 1.  Pt was aware of appt for iron infusion on 12/20/16.

## 2016-12-10 NOTE — Telephone Encounter (Signed)
Spoke with pt regarding the addition of iv iron on 8/30.

## 2016-12-18 ENCOUNTER — Encounter: Payer: Self-pay | Admitting: Pharmacist

## 2016-12-18 DIAGNOSIS — Z170 Estrogen receptor positive status [ER+]: Secondary | ICD-10-CM

## 2016-12-18 DIAGNOSIS — C50511 Malignant neoplasm of lower-outer quadrant of right female breast: Secondary | ICD-10-CM

## 2016-12-18 NOTE — Progress Notes (Signed)
Telephone documentation  Study code: [id number]  Attempted to call patient and review with her the pharmacogenetic information that was obtained for the "Pharmacogenetic analysis of toxicities related to administration of taxanes in breast cancer patients" study. Unable to reach patient. Provided my office number for the patient to call back.  Darl Pikes, PharmD, BCPS Hematology/Oncology Clinical Pharmacist [facility] [phone]

## 2016-12-18 NOTE — Progress Notes (Signed)
Telephone documentation  Study code: [id number]  Spoke with patient over the phone today and reviewed with her the pharmacogenetic information listed below for the four genes of interest of the "Pharmacogenetic analysis of toxicities related to administration of taxanes in breast cancer patients" study. The information below was reviewed with the patient. Offered the patient the option of speaking with a [facility] genetic counselor and she was not interested in making an appointment. Provide my office number to the patient if she had additional questions.  **The buccal swab testing was NOT conducted at a CLIA validated lab. The patient was reminded that the information given was for informational proposes only and should NOT be used to make clinical decisions.   Gene Phenotype  CYP3A4 Normal metabolizer   CYP3A5 Poor metabolizer   SLCO1B1 Normal function  ABCB1 Normal function    Darl Pikes, PharmD, BCPS Hematology/Oncology Clinical Pharmacist [facility] [phone]

## 2016-12-20 ENCOUNTER — Ambulatory Visit (HOSPITAL_BASED_OUTPATIENT_CLINIC_OR_DEPARTMENT_OTHER): Payer: BLUE CROSS/BLUE SHIELD

## 2016-12-20 VITALS — BP 144/94 | HR 84 | Temp 98.6°F

## 2016-12-20 DIAGNOSIS — D509 Iron deficiency anemia, unspecified: Secondary | ICD-10-CM | POA: Diagnosis not present

## 2016-12-20 DIAGNOSIS — D500 Iron deficiency anemia secondary to blood loss (chronic): Secondary | ICD-10-CM

## 2016-12-20 MED ORDER — SODIUM CHLORIDE 0.9 % IV SOLN
Freq: Once | INTRAVENOUS | Status: AC
  Administered 2016-12-20: 10:00:00 via INTRAVENOUS

## 2016-12-20 MED ORDER — SODIUM CHLORIDE 0.9 % IV SOLN
510.0000 mg | Freq: Once | INTRAVENOUS | Status: AC
  Administered 2016-12-20: 510 mg via INTRAVENOUS
  Filled 2016-12-20: qty 17

## 2016-12-20 NOTE — Patient Instructions (Signed)

## 2016-12-20 NOTE — Telephone Encounter (Signed)
Oral Chemotherapy Pharmacist Encounter  Noted Nerlynx start date: 11/09/16  Johny Drilling, PharmD, BCPS, BCOP 12/20/2016  2:10 PM [facility] [phone]

## 2017-01-10 ENCOUNTER — Other Ambulatory Visit: Payer: Self-pay | Admitting: Hematology

## 2017-01-15 ENCOUNTER — Encounter: Payer: Self-pay | Admitting: Oncology

## 2017-01-17 ENCOUNTER — Encounter: Payer: Self-pay | Admitting: Radiation Oncology

## 2017-01-17 ENCOUNTER — Ambulatory Visit
Admission: RE | Admit: 2017-01-17 | Discharge: 2017-01-17 | Disposition: A | Discharge status: Home / Self Care | Payer: BLUE CROSS/BLUE SHIELD | Source: Ambulatory Visit | Attending: Radiation Oncology | Admitting: Radiation Oncology

## 2017-01-17 VITALS — BP 143/99 | HR 77 | Temp 98.5°F | Ht 63.0 in | Wt 141.6 lb

## 2017-01-17 DIAGNOSIS — C50511 Malignant neoplasm of lower-outer quadrant of right female breast: Secondary | ICD-10-CM | POA: Insufficient documentation

## 2017-01-17 DIAGNOSIS — Z170 Estrogen receptor positive status [ER+]: Secondary | ICD-10-CM | POA: Diagnosis not present

## 2017-01-17 DIAGNOSIS — Z923 Personal history of irradiation: Secondary | ICD-10-CM | POA: Diagnosis not present

## 2017-01-17 DIAGNOSIS — Z79899 Other long term (current) drug therapy: Secondary | ICD-10-CM | POA: Insufficient documentation

## 2017-01-17 NOTE — Progress Notes (Signed)
Radiation Oncology         [phone] ________________________________  Name: Melanie Moss MRN: [id number]  Date: 01/17/2017  DOB: [birth year]  Follow-Up Visit Note  CC: Lucianne Lei, MD  Excell Seltzer, MD    ICD-10-CM   1. Malignant neoplasm of lower-outer quadrant of right breast of female, estrogen receptor positive ([place]) C50.511    Z17.0     Diagnosis:   41 y.o. 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  Interval Since Last Radiation:  9 months  03/07/16 - 04/27/16 : Right Chest Wall treated to 45 Gy in 25 fractions, and then Boosted an additional 16 Gy in 8 fractions.  Narrative:  Melanie Moss is here for follow up. She denies having any pain. She continues to have fatigue but thinks it is from her job. She is taking Tamoxifen and Miralex. The skin on her right breast has hyperpigmentation.  She said she does not know when her reconstructive surgery will be yet. She has lost 15 pounds in preparation for upcoming surgery.   ALLERGIES:  has No Known Allergies.  Meds: Current Outpatient Prescriptions  Medication Sig Dispense Refill  . amLODipine (NORVASC) 10 MG tablet Take 1 tablet (10 mg total) by mouth at bedtime. 30 tablet 3  . B Complex-C (SUPER B COMPLEX PO) Take 1 tablet by mouth daily.    . clonazePAM (KLONOPIN) 0.5 MG tablet Take 0.5 mg by mouth 2 (two) times daily.  0  . diphenhydrAMINE (BENADRYL) 12.5 MG/5ML elixir Take 5 mLs (12.5 mg total) by mouth every 6 (six) hours as needed for itching. 120 mL 0  . diphenoxylate-atropine (LOMOTIL) 2.5-0.025 MG tablet TAKE 1 TO 2 TABLETS BY MOUTH 4 TIMES DAILY AS NEEDED FOR DIARRHEA OR LOOSE STOOLS 30 tablet 1  . Elastic Bandages & Supports (KNEE COMPRESSION SLEEVE/L/XL) MISC   0  . ferrous sulfate 325 (65 FE) MG tablet Take 325 mg by mouth 2 (two)  times daily with a meal.    . hydrochlorothiazide (HYDRODIURIL) 25 MG tablet Take 1 tablet (25 mg total) by mouth daily. 30 tablet 6  . ibuprofen (ADVIL,MOTRIN) 200 MG tablet Take 200 mg by mouth every 6 (six) hours as needed.    . Multiple Vitamin (MULTIVITAMIN) tablet Take 1 tablet by mouth daily.    . Neratinib Maleate (NERLYNX) 40 MG tablet Take 6 tablets (240 mg total) by mouth daily. Take with food. 180 tablet 2  . potassium chloride SA (K-DUR,KLOR-CON) 20 MEQ tablet Take 2 tablets (40 mEq total) by mouth daily. 60 tablet 3  . tamoxifen (NOLVADEX) 20 MG tablet Take 1 tablet (20 mg total) by mouth daily. 30 tablet 5  . hydrocortisone 2.5 % cream Apply topically 2 (two) times daily. (Patient not taking: Reported on 01/17/2017) 30 g 1  . irbesartan-hydrochlorothiazide (AVALIDE) 150-12.5 MG tablet Take 1 tablet by mouth daily.  0  . ondansetron (ZOFRAN-ODT) 4 MG disintegrating tablet Take 4 mg by mouth every 8 (eight) hours as needed for nausea or vomiting. Reported on 11/01/2015  0  . polyethylene glycol (MIRALAX / GLYCOLAX) packet Take 17 g by mouth daily as needed for mild constipation. (Patient not taking: Reported on 01/17/2017) 14 each 0  . senna (SENOKOT) 8.6 MG TABS tablet Take 1 tablet (8.6 mg total) by mouth 2 (two) times daily. (Patient not taking: Reported on 01/17/2017) 120 each 0  No current facility-administered medications for this encounter.    Facility-Administered Medications Ordered in Other Encounters  Medication Dose Route Frequency Provider Last Rate Last Dose  . 0.9 %  sodium chloride infusion   Intravenous Once Truitt Merle, MD      . alteplase (CATHFLO ACTIVASE) injection 2 mg  2 mg Intracatheter Once PRN Truitt Merle, MD      . heparin lock flush 100 unit/mL  250 Units Intracatheter Once PRN Truitt Merle, MD      . heparin lock flush 100 unit/mL  500 Units Intravenous Once PRN Truitt Merle, MD      . sodium chloride 0.9 % injection 10 mL  10 mL Intracatheter PRN Truitt Merle, MD        . sodium chloride 0.9 % injection 10 mL  10 mL Intravenous PRN Truitt Merle, MD      . sodium chloride 0.9 % injection 3 mL  3 mL Intravenous Once PRN Truitt Merle, MD      . sodium chloride flush (NS) 0.9 % injection 10 mL  10 mL Intravenous PRN Truitt Merle, MD        Physical Findings: The patient is in no acute distress. Patient is alert and oriented.  height is '5\' 3"'$  (1.6 m) and weight is 141 lb 9.6 oz (64.2 kg). Her oral temperature is 98.5 F (36.9 C). Her blood pressure is 143/99 (abnormal) and her pulse is 77. Her oxygen saturation is 100%. .     Wt Readings from Last 3 Encounters:  01/17/17 141 lb 9.6 oz (64.2 kg)  12/07/16 146 lb 12.8 oz (66.6 kg)  11/01/16 150 lb 14.4 oz (68.4 kg)   Lungs are clear to auscultation bilaterally. Heart has regular rate and rhythm. No palpable cervical, supraclavicular, or axillary adenopathy. Abdomen soft, non-tender, normal bowel sounds.  Left breast no palpable mass or nipple discharge. Right tissue expander in place, hyperpigmentation changes noted. No signs of occurences along the chest wall and mastectomy scar area.   Lab Findings: Lab Results  Component Value Date   WBC 5.9 12/07/2016   HGB 10.1 (L) 12/07/2016   HCT 30.8 (L) 12/07/2016   MCV 92.5 12/07/2016   PLT 211 12/07/2016    Radiographic Findings: No results found.  Impression: No signs of reoccurrence noted in the clinical exam.  Plan:  PRN follow up with Radiation Oncology. She will continue to see Medical Oncology on a regular basis. She will be having her reconstructive breast surgery in the next months.  ____________________________________  Blair Promise, PhD, MD  This document serves as a record of services personally performed by [name] MD. It was created on his behalf by Delton Coombes, a trained medical scribe. The creation of this record is based on the scribe's personal observations and the provider's statements to them. This document has been checked and  approved by the attending provider.

## 2017-01-17 NOTE — Progress Notes (Signed)
Melanie Moss is here for follow up.  She denies having any pain.  She continues to have fatigue but thinks it is from her job.  She is taking Tamoxifen.  The skin on her right breast has hyperpigmentation.  She said she does not know when her reconstructive surgery will be yet.  BP (!) 143/99 (BP Location: Left Arm, Patient Position: Sitting)   Pulse 77   Temp 98.5 F (36.9 C) (Oral)   Ht 5\' 3"  (1.6 m)   Wt 141 lb 9.6 oz (64.2 kg)   SpO2 100%   BMI 25.08 kg/m    Wt Readings from Last 3 Encounters:  01/17/17 141 lb 9.6 oz (64.2 kg)  12/07/16 146 lb 12.8 oz (66.6 kg)  11/01/16 150 lb 14.4 oz (68.4 kg)

## 2017-01-18 NOTE — Addendum Note (Signed)
Encounter addended by: Hess, Karen R, RN on: 01/18/2017 10:01 AM<BR>    Actions taken: Charge Capture section accepted

## 2017-01-30 ENCOUNTER — Telehealth: Payer: Self-pay

## 2017-01-30 NOTE — Telephone Encounter (Signed)
Spoke with patient to remind her of SCP visit appt on 02/04/17 at 2 pm.  She stated she would come to appt.

## 2017-02-04 ENCOUNTER — Encounter: Payer: Self-pay | Admitting: General Practice

## 2017-02-04 ENCOUNTER — Ambulatory Visit (HOSPITAL_BASED_OUTPATIENT_CLINIC_OR_DEPARTMENT_OTHER): Payer: BLUE CROSS/BLUE SHIELD | Admitting: Adult Health

## 2017-02-04 ENCOUNTER — Telehealth: Payer: Self-pay | Admitting: Adult Health

## 2017-02-04 ENCOUNTER — Encounter: Payer: Self-pay | Admitting: Adult Health

## 2017-02-04 VITALS — BP 148/95 | HR 86 | Temp 98.3°F | Resp 18 | Ht 63.0 in | Wt 140.2 lb

## 2017-02-04 DIAGNOSIS — Z79810 Long term (current) use of selective estrogen receptor modulators (SERMs): Secondary | ICD-10-CM

## 2017-02-04 DIAGNOSIS — C50511 Malignant neoplasm of lower-outer quadrant of right female breast: Secondary | ICD-10-CM | POA: Diagnosis not present

## 2017-02-04 DIAGNOSIS — Z170 Estrogen receptor positive status [ER+]: Secondary | ICD-10-CM | POA: Diagnosis not present

## 2017-02-04 NOTE — Progress Notes (Signed)
[facility] Spiritual Care Note  Met with [place] per referral from Lisabeth Register Causey/NP for emotional support due to distress and tearfulness related to survivorship transition.  Normalized feelings, explored threads of meaning, helped with theological reflection and reframing, and provided emotional support.  Tyeasha describes herself as someone who tends not to talk about her feelings, but instead "just pushes through."  At the close of this encounter, she said she feels much better, "Just getting that out helped a lot."  She currently works second shift, which may put Breast Cancer and [place], as well as [employer] (Finding Your New Normal, our survivorship series), out of her reach until she gets a new job--which is a change she is actively working to make.  I will submit an [place] referral for her today per her request, and we plan to f/u by phone next week with the understanding that we can certainly set up in-person appts anytime she would like.   [place], [place], [place] Pager [phone] Voicemail [phone]

## 2017-02-04 NOTE — Telephone Encounter (Signed)
Scheduled appts per 10/15 los - patient did not want avs or calendar.

## 2017-02-04 NOTE — Progress Notes (Signed)
CLINIC:  Survivorship   REASON FOR VISIT:  Routine follow-up post-treatment for a recent history of breast cancer.  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 breast [facility])   Staging form: Breast, AJCC 7th Edition     Clinical stage from 08/03/2015: Stage IA (T1c, N0, M0) - Unsigned     Pathologic stage from 09/29/2015: Stage IIIA (T3(m), N1a, cM0) - Signed by Truitt Merle, MD on 10/17/2015         Breast cancer of lower-outer quadrant of right female breast ([place])   07/25/2015 Mammogram    diagnostic mammogram and ultrasound showed a 11 mm mass in the 8:00 position of the right breast, small irregular lesion in the retroareolar regio of the right breast, contiguous with the dermis      07/26/2015 Initial Diagnosis    Breast cancer 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      08/02/2015 Imaging    breast MRI showed extensive mass  and non-mass enhancement in the lower outer quadrant of the right breast  spanning 13 cm, focal enhancement within the right nipple, at least 3-4 abnormal appearing right axillary lymph node.      09/29/2015 Surgery    right mastectomy and axillar node dissection       09/29/2015 Pathology Results    Right mastectomy and axillary node dissection showed multifocal mammary carcinoma with ductal and lobular features, tumor size 13 cm, 2.1 cm, 1.9 cm, and 0.5 cm, grade 2-3, anterior margins were positive, 2 nodes with metastasis and one with isolated cells      11/01/2015 -  02/14/2016 Chemotherapy    TCHP every 3 weeks, for 6 cycles, followed by herceptin maintenance therapy for a total of one year       03/06/2016 - 11/01/2016 Antibody Plan    Maintenance Herceptin and perjeta, every 3 weeks      03/07/2016 - 04/27/2016 Radiation Therapy    03/07/16-04/27/16 Site/dose: 1) 41fd right chest wall/ 45 Gy in 25 fractions 2) Right mastectomy scar / 16 Gy in 8 fractions      05/21/2016 -  Anti-estrogen oral therapy    Tamoxifen 20 mg      10/17/2016 Mammogram    IMPRESSION: No mammographic evidence of malignancy. A result letter of this screening mammogram will be mailed directly to the patient.      10/22/2016 Echocardiogram    Impressions:  - Normal study. Stable GLPSS at -21%. LVEF 60-65%.      10/2016 Miscellaneous    Neratinib daily       INTERVAL HISTORY:  [facility] to review her survivorship care plan detailing her treatment course for breast cancer, as well as monitoring long-term side effects of that treatment, education regarding health maintenance, screening, and overall wellness and health promotion.     Overall, Ms. BSievertreports feeling quite well.  She is taking Tamoxifen daily. She notes some vaginal  discharge from time to time.  She also is taking Neratinib daily.  She does not like Neratinib.  She has experienced weight loss, diarrhea, decreased appetite.  She reviewed this with Dr. Burr Medico and was prescribed Lomotil.  It is working so far.  She is fatigued as well.      REVIEW OF SYSTEMS:  Review of Systems  Constitutional: Positive for appetite change. Negative for chills, fatigue, fever and unexpected weight change.  HENT:   Negative for hearing loss and lump/mass.   Eyes: Negative for eye problems and icterus.  Respiratory: Negative for chest tightness, cough and shortness of breath.   Cardiovascular: Negative for chest pain, leg swelling and palpitations.    Gastrointestinal: Positive for diarrhea (controlled with lomotil). Negative for abdominal distention, abdominal pain, constipation, nausea and vomiting.  Endocrine: Negative for hot flashes.  Genitourinary: Negative for difficulty urinating and dyspareunia.   Musculoskeletal: Negative for arthralgias.  Skin: Negative for itching and rash.  Neurological: Negative for dizziness.  Hematological: Negative for adenopathy. Does not bruise/bleed easily.  Psychiatric/Behavioral: Negative for depression. The patient is not nervous/anxious.   Breast: Denies any new nodularity, masses, tenderness, nipple changes, or nipple discharge.      ONCOLOGY TREATMENT TEAM:  1. Surgeon:  Dr. Excell Seltzer at [facility] 2. Medical Oncologist: Dr. Burr Medico  3. Radiation Oncologist: Dr. Sondra Come    PAST MEDICAL/SURGICAL HISTORY:  Past Medical History:  Diagnosis Date  . Anemia   . Anxiety   . Breast cancer of lower-outer quadrant of right female breast ([place]) 07/29/2015  . Family history of breast cancer   . Family history of colon cancer   . Family history of prostate cancer   . GERD (gastroesophageal reflux disease)    tums   . Gestational diabetes   . History of radiation therapy 03/07/16-04/27/16   right chest wall 45 Gy in 25 fractions, right mastectomy scar 16 Gy in 8 fractions  . Hyperlipidemia   . Hypertension    not currently on medication  . Personal history of chemotherapy   . Personal history of radiation therapy   . Preterm labor    Past Surgical History:  Procedure Laterality Date  . AXILLARY LYMPH NODE DISSECTION Right 09/29/2015   Procedure: AXILLARY LYMPH NODE DISSECTION;  Surgeon: Excell Seltzer, MD;  Location: [place];  Service: General;  Laterality: Right;  . BREAST RECONSTRUCTION WITH PLACEMENT OF TISSUE EXPANDER AND FLEX HD (ACELLULAR HYDRATED DERMIS) Right 09/29/2015   Procedure: BREAST RECONSTRUCTION WITH PLACEMENT OF TISSUE EXPANDER AND FLEX HD (ACELLULAR HYDRATED DERMIS);   Surgeon: Loel Lofty Dillingham, DO;  Location: [place];  Service: Plastics;  Laterality: Right;  . CERVICAL CERCLAGE  02/19/2011   Procedure: CERCLAGE CERVICAL;  Surgeon: Agnes Lawrence, MD;  Location: [facility];  Service: Gynecology;  Laterality: N/A;  . DILATION AND CURETTAGE OF UTERUS    . MASTECTOMY     right 2017  . MASTECTOMY WITH AXILLARY LYMPH NODE DISSECTION Right 09/29/2015  . PORTACATH PLACEMENT N/A 09/29/2015   Procedure: INSERTION PORT-A-CATH;  Surgeon: Excell Seltzer, MD;  Location: [place];  Service: General;  Laterality: N/A;  . SIMPLE MASTECTOMY WITH AXILLARY SENTINEL NODE BIOPSY Right 09/29/2015   Procedure: RIGHT TOTAL  MASTECTOMY WITH AXILLARY SENTINEL NODE BIOPSY;  Surgeon: Excell Seltzer, MD;  Location: [place];  Service: General;  Laterality: Right;     ALLERGIES:  No Known Allergies   CURRENT MEDICATIONS:  Outpatient Encounter Prescriptions as of 02/04/2017  Medication Sig  . amLODipine (NORVASC)  10 MG tablet Take 1 tablet (10 mg total) by mouth at bedtime.  . B Complex-C (SUPER B COMPLEX PO) Take 1 tablet by mouth daily.  . clonazePAM (KLONOPIN) 0.5 MG tablet Take 0.5 mg by mouth 2 (two) times daily.  . diphenhydrAMINE (BENADRYL) 12.5 MG/5ML elixir Take 5 mLs (12.5 mg total) by mouth every 6 (six) hours as needed for itching.  . diphenoxylate-atropine (LOMOTIL) 2.5-0.025 MG tablet TAKE 1 TO 2 TABLETS BY MOUTH 4 TIMES DAILY AS NEEDED FOR DIARRHEA OR LOOSE STOOLS  . Elastic Bandages & Supports (KNEE COMPRESSION SLEEVE/L/XL) MISC   . ferrous sulfate 325 (65 FE) MG tablet Take 325 mg by mouth 2 (two) times daily with a meal.  . hydrocortisone 2.5 % cream Apply topically 2 (two) times daily.  Marland Kitchen ibuprofen (ADVIL,MOTRIN) 200 MG tablet Take 200 mg by mouth every 6 (six) hours as needed.  . irbesartan-hydrochlorothiazide (AVALIDE) 150-12.5 MG tablet Take 1 tablet by mouth daily.  . Multiple Vitamin (MULTIVITAMIN) tablet Take 1 tablet by mouth daily.  . Neratinib Maleate  (NERLYNX) 40 MG tablet Take 6 tablets (240 mg total) by mouth daily. Take with food.  . ondansetron (ZOFRAN-ODT) 4 MG disintegrating tablet Take 4 mg by mouth every 8 (eight) hours as needed for nausea or vomiting. Reported on 11/01/2015  . polyethylene glycol (MIRALAX / GLYCOLAX) packet Take 17 g by mouth daily as needed for mild constipation.  . potassium chloride SA (K-DUR,KLOR-CON) 20 MEQ tablet Take 2 tablets (40 mEq total) by mouth daily.  Marland Kitchen senna (SENOKOT) 8.6 MG TABS tablet Take 1 tablet (8.6 mg total) by mouth 2 (two) times daily.  . tamoxifen (NOLVADEX) 20 MG tablet Take 1 tablet (20 mg total) by mouth daily.  . hydrochlorothiazide (HYDRODIURIL) 25 MG tablet Take 1 tablet (25 mg total) by mouth daily.   Facility-Administered Encounter Medications as of 02/04/2017  Medication  . 0.9 %  sodium chloride infusion  . alteplase (CATHFLO ACTIVASE) injection 2 mg  . heparin lock flush 100 unit/mL  . heparin lock flush 100 unit/mL  . sodium chloride 0.9 % injection 10 mL  . sodium chloride 0.9 % injection 10 mL  . sodium chloride 0.9 % injection 3 mL  . sodium chloride flush (NS) 0.9 % injection 10 mL     ONCOLOGIC FAMILY HISTORY:  Family History  Problem Relation Age of Onset  . Hypertension Mother   . Hypertension Father   . Prostate cancer Father 78  . Breast cancer Maternal Aunt 46  . Breast cancer Paternal Aunt 35  . Cirrhosis Maternal Uncle   . Prostate cancer Paternal Uncle   . Throat cancer Maternal Aunt        non smoker  . Prostate cancer Paternal Uncle   . Colon cancer Cousin        maternal first cousin dx in his 61s  . Lung cancer Cousin        smoker     GENETIC COUNSELING/TESTING: negative  SOCIAL HISTORY:  THEA HOLSHOUSER is single and lives with her son in [place], [place].    Ms. Fregeau is currently working at [employer].  She denies any current or history of tobacco, alcohol, or illicit drug use.     PHYSICAL EXAMINATION:  Vital Signs:   Vitals:     02/04/17 1416  BP: (!) 148/95  Pulse: 86  Resp: 18  Temp: 98.3 F (36.8 C)  SpO2: 100%   Filed Weights   02/04/17 1416  Weight:  140 lb 3.2 oz (63.6 kg)   General: Well-nourished, well-appearing female in no acute distress.  She is unaccompanied today.   HEENT: Head is normocephalic.  Pupils equal and reactive to light. Conjunctivae clear without exudate.  Sclerae anicteric. Oral mucosa is pink, moist.  Oropharynx is pink without lesions or erythema.  Lymph: No cervical, supraclavicular, or infraclavicular lymphadenopathy noted on palpation.  Cardiovascular: Regular rate and rhythm.Marland Kitchen Respiratory: Clear to auscultation bilaterally. Chest expansion symmetric; breathing non-labored.  GI: Abdomen soft and round; non-tender, non-distended. Bowel sounds normoactive.  GU: Deferred.  Neuro: No focal deficits. Steady gait.  Psych: Mood and affect normal and appropriate for situation.  Extremities: No edema. MSK: No focal spinal tenderness to palpation.  Full range of motion in bilateral upper extremities Skin: Warm and dry.  LABORATORY DATA:  None for this visit.  DIAGNOSTIC IMAGING:  None for this visit.      ASSESSMENT AND PLAN:  Ms.. Moss is a pleasant 41 y.o. 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  She presents to the Survivorship Clinic for our initial meeting and routine follow-up post-completion of treatment for breast cancer.    1. Stage IIIA right breast cancer:  Ms. Mick is continuing to recover from definitive treatment for breast cancer. She will follow-up with her medical oncologist, Dr. Burr Medico in a couple of months with history and physical exam per surveillance protocol.  She will continue her anti-estrogen therapy with  Tamoxifen. Thus far, she is tolerating the Tamoxifen well, with minimal side effects.  She is also taking the Neratinib, see #2. Today, a comprehensive survivorship care plan and treatment summary was reviewed with the patient today detailing her breast cancer diagnosis, treatment course, potential late/long-term effects of treatment, appropriate follow-up care with recommendations for the future, and patient education resources.  A copy of this summary, along with a letter will be sent to the patient's primary care provider via mail/fax/In Basket message after today's visit.    2. Diarrhea/decreased appetite: This is improving with Lomotil, and she will continue this.  I recommended small frequent meals.    3.  Mild Depression: Through discussion today, Hawley expresses that she is depressed.  She usually talks through this with Abby Potash, her Education officer, museum.  I conveyed to [place] that Abby Potash is no longer working here.  We reviewed her depression, and indications for starting medication and I briefly discussed medication such as Effexor.  I also recommended she talk with Lorrin Jackson our chaplain, and she agreed at the end of today's appointment.    4. Bone health:  Given Ms. Hollen's history of breast cancer, she is at slight risk for bone demineralization.  I counseled her that Tamoxifen has a protective effect on her bones and may actually increase her bone density.  She was given education on specific activities to promote bone health.  5. Cancer screening:  Due to Ms. Carboni's history and her age, she should receive screening for skin cancers, colon cancer, and gynecologic cancers.  The information and recommendations are listed on the patient's comprehensive care plan/treatment summary and were reviewed in detail with the patient.    6. Health maintenance and wellness promotion: Ms. Raulerson was encouraged to consume 5-7 servings of fruits and vegetables per day. We reviewed the "Nutrition Rainbow" handout, as  well as the handout "Take Control  of Your Health and Reduce Your Cancer Risk" from the [place].  She was also encouraged to engage in moderate to vigorous exercise for 30 minutes per day most days of the week. We discussed the [employer] fitness program, which is designed for cancer survivors to help them become more physically fit after cancer treatments.  She was instructed to limit her alcohol consumption and continue to abstain from tobacco use.     7. Support services/counseling: It is not uncommon for this period of the patient's cancer care trajectory to be one of many emotions and stressors.  We discussed an opportunity for her to participate in the next session of [facility] ("Finding Your New Normal") support group series designed for patients after they have completed treatment.   Ms. Althaus was encouraged to take advantage of our many other support services programs, support groups, and/or counseling in coping with her new life as a cancer survivor after completing anti-cancer treatment.  She was offered support today through active listening and expressive supportive counseling.  She was given information regarding our available services and encouraged to contact me with any questions or for help enrolling in any of our support group/programs.    Dispo:   -Return to cancer center in 2 months for f/u with Dr. Burr Medico  -Mammogram due in 10/2017 -Follow up with [facility], Dr. Excell Seltzer in 02/2017 -She is welcome to return back to the Survivorship Clinic at any time; no additional follow-up needed at this time.  -Consider referral back to survivorship as a long-term survivor for continued surveillance  A total of (60) minutes of face-to-face time was spent with this patient with greater than 50% of that time in counseling and care-coordination.   Gardenia Phlegm, NP Survivorship Program [facility] [phone]   Note: PRIMARY CARE  PROVIDER Lucianne Lei, [place] [phone]

## 2017-02-06 ENCOUNTER — Telehealth: Payer: Self-pay | Admitting: *Deleted

## 2017-02-06 NOTE — Telephone Encounter (Addendum)
Left message on vm to call back regarding Nerlynx refill & to see how she is doing with addition of lomotil.  Pt called pt this pm & states that she has @ 2 diarrhea stools daily & is taking one lomotil daily.  She reports that this is better than before lomotil when she was going @ 5 x/d.  She reports that she is hungry but eats a little & feels full.  She forces herself to eat & reports eating snacks/Ensure b/t meals.  She is concerned b/c she has lost 18 lbs since being on the Nerlynx, a 2-3 month period.  She would like to ask Dr Burr Medico if she is OK with her getting reconstructive surgery in November. Message to Dr Burr Medico. Did not refill Nerlynx.  Pt has @ 2 wk supply left.

## 2017-02-11 ENCOUNTER — Encounter: Payer: Self-pay | Admitting: General Practice

## 2017-02-11 NOTE — Progress Notes (Signed)
[place] Spiritual Care Note  Followed up briefly with Phebe by phone.  Per pt, she is "doing better" [feeling and coping better] this week.  She welcomes f/u calls and knows to contact Support Team anytime with needs or questions.  Plan to phone her next week to check in with more detail.  Pt's note about her work schedule:  10 or 11 is a good time to catch her before leaving for work Sun-Wed; Th/F she is off and able to take a call more flexibly.   [place], [place], [facility] Pager [phone] Voicemail [phone]

## 2017-02-12 ENCOUNTER — Other Ambulatory Visit: Payer: BLUE CROSS/BLUE SHIELD

## 2017-02-12 ENCOUNTER — Ambulatory Visit: Payer: BLUE CROSS/BLUE SHIELD | Admitting: Hematology

## 2017-02-12 NOTE — Telephone Encounter (Signed)
I called pt back and left a message on her; OK to have reconstruction surgery in Nov. I am concerned about her poor appetite and weight loss, I encouraged her to eat more, but if she loses more weight, I'll probably stop nerlynx. I encouraged her to call back and update her status. I will refill her Nerlynx for one more cycle for now.   Truitt Merle MD

## 2017-02-14 ENCOUNTER — Other Ambulatory Visit: Payer: BLUE CROSS/BLUE SHIELD

## 2017-02-14 ENCOUNTER — Other Ambulatory Visit: Payer: Self-pay

## 2017-02-14 ENCOUNTER — Ambulatory Visit: Payer: BLUE CROSS/BLUE SHIELD | Admitting: Hematology

## 2017-02-14 MED ORDER — NERATINIB MALEATE 40 MG PO TABS: 240.0000 mg | ORAL_TABLET | Freq: Every day | ORAL | 2 refills | Status: DC

## 2017-03-05 ENCOUNTER — Encounter (HOSPITAL_BASED_OUTPATIENT_CLINIC_OR_DEPARTMENT_OTHER): Payer: Self-pay | Admitting: *Deleted

## 2017-03-05 ENCOUNTER — Other Ambulatory Visit: Payer: Self-pay

## 2017-03-05 NOTE — Progress Notes (Signed)
Coming tomorrow for BMET and urine pregnancy. Requested EKG from Dr. Larey Dresser office.

## 2017-03-06 ENCOUNTER — Encounter (HOSPITAL_BASED_OUTPATIENT_CLINIC_OR_DEPARTMENT_OTHER)
Admission: RE | Admit: 2017-03-06 | Discharge: 2017-03-06 | Disposition: A | Discharge status: Home / Self Care | Payer: BLUE CROSS/BLUE SHIELD | Source: Ambulatory Visit | Attending: Plastic Surgery | Admitting: Plastic Surgery

## 2017-03-06 ENCOUNTER — Ambulatory Visit: Payer: Self-pay | Admitting: Plastic Surgery

## 2017-03-06 DIAGNOSIS — Z9011 Acquired absence of right breast and nipple: Secondary | ICD-10-CM

## 2017-03-06 DIAGNOSIS — N651 Disproportion of reconstructed breast: Secondary | ICD-10-CM

## 2017-03-06 DIAGNOSIS — Z01812 Encounter for preprocedural laboratory examination: Secondary | ICD-10-CM | POA: Diagnosis not present

## 2017-03-06 LAB — BASIC METABOLIC PANEL
Anion gap: 8 (ref 5–15)
BUN: 11 mg/dL (ref 6–20)
CO2: 28 mmol/L (ref 22–32)
Calcium: 9.7 mg/dL (ref 8.9–10.3)
Chloride: 104 mmol/L (ref 101–111)
Creatinine, Ser: 1.09 mg/dL — ABNORMAL HIGH (ref 0.44–1.00)
GFR calc Af Amer: >60 mL/min (ref >60)
GFR calc non Af Amer: >60 mL/min (ref >60)
Glucose, Bld: 111 mg/dL — ABNORMAL HIGH (ref 65–99)
Potassium: 3 mmol/L — ABNORMAL LOW (ref 3.5–5.1)
Sodium: 140 mmol/L (ref 135–145)

## 2017-03-06 LAB — POCT PREGNANCY, URINE: Preg Test, Ur: NEGATIVE

## 2017-03-06 NOTE — H&P (Signed)
Melanie Moss is an 41 y.o. female.   Chief Complaint: breast asymmetry and absence HPI: The patient is a 41 yrs old bf here for breast reconstruction. She underwent a right mastectomy with expander and FlexHD placement and is now at 460 cc.  She was able to reduce her weight. She has ptosis on the left breast.  No recent illnesses.  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   She gained 12 pounds.  There is mild edema at the right breast area likely from the radiation.  She would like to have a silicone implant on the right and reduction for symmetry on the left.  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 The MRI showed 3 - 4 abnormal appearing right axillary lymph nodes. She is 5 feet 3 inches tall, weights 143 pounds, preop bra = 34 DDD. She is BRCA negative. She is married and has a son.    Past Medical History:  Diagnosis Date  . Anemia   . Anxiety   . Breast cancer of lower-outer quadrant of right female breast ([facility]) 07/29/2015  . Family history of breast cancer   . Family history of colon cancer   . Family history of prostate cancer   . GERD (gastroesophageal reflux disease)    tums   . History of radiation therapy 03/07/16-04/27/16   right chest wall 45 Gy in 25 fractions, right mastectomy scar 16 Gy in 8 fractions  . Hyperlipidemia   . Hypertension    not currently on medication  . Personal history of chemotherapy   . Personal history of radiation therapy   . Preterm labor     Past Surgical History:  Procedure Laterality Date  . CERVICAL CERCLAGE  02/19/2011   Procedure: CERCLAGE CERVICAL;  Surgeon: Lisa A Jackson-Moore, MD;  Location: [facility];  Service: Gynecology;  Laterality: N/A;  . DILATION AND CURETTAGE OF UTERUS    . MASTECTOMY     right 2017  . MASTECTOMY WITH AXILLARY LYMPH NODE  DISSECTION Right 09/29/2015    Family History  Problem Relation Age of Onset  . Hypertension Mother   . Hypertension Father   . Prostate cancer Father 70  . Breast cancer Maternal Aunt 40  . Breast cancer Paternal Aunt 60  . Cirrhosis Maternal Uncle   . Prostate cancer Paternal Uncle   . Throat cancer Maternal Aunt        non smoker  . Prostate cancer Paternal Uncle   . Colon cancer Cousin        maternal first cousin dx in his 50s  . Lung cancer Cousin        smoker   Social History:  reports that  has never smoked. she has never used smokeless tobacco. She reports that she does not drink alcohol or use drugs.  Allergies: No Known Allergies   (Not in a hospital admission)  No results found for this or any previous visit (from the past 48 hour(s)). No results found.  Review of Systems  Constitutional: Negative.   HENT: Negative.   Eyes: Negative.   Respiratory: Negative.   Cardiovascular: Negative.   Gastrointestinal: Negative.   Genitourinary: Negative.   Musculoskeletal: Negative.   Skin: Negative.   Neurological: Negative.   Psychiatric/Behavioral: Negative.     There were no vitals taken for this visit. Physical   Exam  Constitutional: She is oriented to person, place, and time. She appears well-developed and well-nourished.  HENT:  Head: Normocephalic and atraumatic.  Eyes: Conjunctivae and EOM are normal. Pupils are equal, round, and reactive to light.  Cardiovascular: Normal rate.  Respiratory: Effort normal. No respiratory distress.  GI: Soft. She exhibits no distension. There is no tenderness.  Neurological: She is alert and oriented to person, place, and time.  Skin: Skin is warm.  Psychiatric: She has a normal mood and affect. Her behavior is normal. Judgment and thought content normal.     Assessment/Plan She would like to proceed with RIGHT breast expander removal and placement of the silicone implant.  LEFT mastopexy / reduction for symmetry.   She is aware of the complication risks due to the radiation.  Muscle flap is possible if needed.   Wallace Going, DO 03/06/2017, 8:04 AM

## 2017-03-06 NOTE — Progress Notes (Addendum)
Labs obtained, urine pregnancy test negative.   Lab results reviewed by Dr. Deatra Canter, will proceed with surgery as scheduled.  Pt had Echo done in July 2018- will not need EKG per Dr. Ola Spurr.

## 2017-03-06 NOTE — H&P (View-Only) (Signed)
Melanie Moss is an 41 y.o. female.   Chief Complaint: breast asymmetry and absence HPI: The patient is a 41 yrs old bf here for breast reconstruction. She underwent a right mastectomy with expander and FlexHD placement and is now at 460 cc.  She was able to reduce her weight. She has ptosis on the left breast.  No recent illnesses.  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   She gained 12 pounds.  There is mild edema at the right breast area likely from the radiation.  She would like to have a silicone implant on the right and reduction for symmetry on the left.  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 The MRI showed 3 - 4 abnormal appearing right axillary lymph nodes. She is 5 feet 3 inches tall, weights 143 pounds, preop bra = 34 DDD. She is BRCA negative. She is married and has a son.    Past Medical History:  Diagnosis Date  . Anemia   . Anxiety   . Breast cancer of lower-outer quadrant of right female breast ([place]) 07/29/2015  . Family history of breast cancer   . Family history of colon cancer   . Family history of prostate cancer   . GERD (gastroesophageal reflux disease)    tums   . History of radiation therapy 03/07/16-04/27/16   right chest wall 45 Gy in 25 fractions, right mastectomy scar 16 Gy in 8 fractions  . Hyperlipidemia   . Hypertension    not currently on medication  . Personal history of chemotherapy   . Personal history of radiation therapy   . Preterm labor     Past Surgical History:  Procedure Laterality Date  . CERVICAL CERCLAGE  02/19/2011   Procedure: CERCLAGE CERVICAL;  Surgeon: Agnes Lawrence, MD;  Location: [facility];  Service: Gynecology;  Laterality: N/A;  . DILATION AND CURETTAGE OF UTERUS    . MASTECTOMY     right 2017  . MASTECTOMY WITH AXILLARY LYMPH NODE  DISSECTION Right 09/29/2015    Family History  Problem Relation Age of Onset  . Hypertension Mother   . Hypertension Father   . Prostate cancer Father 20  . Breast cancer Maternal Aunt 7  . Breast cancer Paternal Aunt 78  . Cirrhosis Maternal Uncle   . Prostate cancer Paternal Uncle   . Throat cancer Maternal Aunt        non smoker  . Prostate cancer Paternal Uncle   . Colon cancer Cousin        maternal first cousin dx in his 14s  . Lung cancer Cousin        smoker   Social History:  reports that  has never smoked. she has never used smokeless tobacco. She reports that she does not drink alcohol or use drugs.  Allergies: No Known Allergies   (Not in a hospital admission)  No results found for this or any previous visit (from the past 48 hour(s)). No results found.  Review of Systems  Constitutional: Negative.   HENT: Negative.   Eyes: Negative.   Respiratory: Negative.   Cardiovascular: Negative.   Gastrointestinal: Negative.   Genitourinary: Negative.   Musculoskeletal: Negative.   Skin: Negative.   Neurological: Negative.   Psychiatric/Behavioral: Negative.     There were no vitals taken for this visit. Physical  Exam  Constitutional: She is oriented to person, place, and time. She appears well-developed and well-nourished.  HENT:  Head: Normocephalic and atraumatic.  Eyes: Conjunctivae and EOM are normal. Pupils are equal, round, and reactive to light.  Cardiovascular: Normal rate.  Respiratory: Effort normal. No respiratory distress.  GI: Soft. She exhibits no distension. There is no tenderness.  Neurological: She is alert and oriented to person, place, and time.  Skin: Skin is warm.  Psychiatric: She has a normal mood and affect. Her behavior is normal. Judgment and thought content normal.     Assessment/Plan She would like to proceed with RIGHT breast expander removal and placement of the silicone implant.  LEFT mastopexy / reduction for symmetry.   She is aware of the complication risks due to the radiation.  Muscle flap is possible if needed.   Wallace Going, DO 03/06/2017, 8:04 AM

## 2017-03-08 ENCOUNTER — Ambulatory Visit: Payer: Self-pay | Admitting: Plastic Surgery

## 2017-03-11 ENCOUNTER — Encounter (HOSPITAL_BASED_OUTPATIENT_CLINIC_OR_DEPARTMENT_OTHER)
Admission: RE | Disposition: A | Discharge status: Home Health | Payer: Self-pay | Source: Ambulatory Visit | Attending: Plastic Surgery

## 2017-03-11 ENCOUNTER — Encounter (HOSPITAL_BASED_OUTPATIENT_CLINIC_OR_DEPARTMENT_OTHER): Payer: Self-pay | Admitting: *Deleted

## 2017-03-11 ENCOUNTER — Ambulatory Visit (HOSPITAL_BASED_OUTPATIENT_CLINIC_OR_DEPARTMENT_OTHER)
Admission: RE | Admit: 2017-03-11 | Discharge: 2017-03-11 | Disposition: A | Discharge status: Home Health | Payer: BLUE CROSS/BLUE SHIELD | Source: Ambulatory Visit | Attending: Plastic Surgery | Admitting: Plastic Surgery

## 2017-03-11 ENCOUNTER — Ambulatory Visit (HOSPITAL_BASED_OUTPATIENT_CLINIC_OR_DEPARTMENT_OTHER): Payer: BLUE CROSS/BLUE SHIELD | Admitting: Anesthesiology

## 2017-03-11 ENCOUNTER — Other Ambulatory Visit: Payer: Self-pay

## 2017-03-11 DIAGNOSIS — N6489 Other specified disorders of breast: Secondary | ICD-10-CM | POA: Diagnosis present

## 2017-03-11 DIAGNOSIS — E785 Hyperlipidemia, unspecified: Secondary | ICD-10-CM | POA: Insufficient documentation

## 2017-03-11 DIAGNOSIS — Z853 Personal history of malignant neoplasm of breast: Secondary | ICD-10-CM | POA: Insufficient documentation

## 2017-03-11 DIAGNOSIS — Z9011 Acquired absence of right breast and nipple: Secondary | ICD-10-CM

## 2017-03-11 DIAGNOSIS — K219 Gastro-esophageal reflux disease without esophagitis: Secondary | ICD-10-CM | POA: Diagnosis not present

## 2017-03-11 DIAGNOSIS — F419 Anxiety disorder, unspecified: Secondary | ICD-10-CM | POA: Insufficient documentation

## 2017-03-11 DIAGNOSIS — I10 Essential (primary) hypertension: Secondary | ICD-10-CM | POA: Diagnosis not present

## 2017-03-11 DIAGNOSIS — Z803 Family history of malignant neoplasm of breast: Secondary | ICD-10-CM | POA: Diagnosis not present

## 2017-03-11 DIAGNOSIS — Z923 Personal history of irradiation: Secondary | ICD-10-CM | POA: Diagnosis not present

## 2017-03-11 DIAGNOSIS — N651 Disproportion of reconstructed breast: Secondary | ICD-10-CM

## 2017-03-11 HISTORY — PX: MASTOPEXY: SHX5358

## 2017-03-11 HISTORY — PX: REMOVAL OF BILATERAL TISSUE EXPANDERS WITH PLACEMENT OF BILATERAL BREAST IMPLANTS: SHX6431

## 2017-03-11 LAB — POCT HEMOGLOBIN-HEMACUE: Hemoglobin: 11.9 g/dL — ABNORMAL LOW (ref 12.0–15.0)

## 2017-03-11 SURGERY — REMOVAL, TISSUE EXPANDER, BREAST, BILATERAL, WITH BILATERAL IMPLANT IMPLANT INSERTION
Anesthesia: General | Site: Breast | Laterality: Right

## 2017-03-11 MED ORDER — CEFAZOLIN SODIUM-DEXTROSE 2-4 GM/100ML-% IV SOLN
2.0000 g | INTRAVENOUS | Status: AC
  Administered 2017-03-11: 2 g via INTRAVENOUS

## 2017-03-11 MED ORDER — TRIAMCINOLONE ACETONIDE 40 MG/ML IJ SUSP
INTRAMUSCULAR | Status: AC
  Filled 2017-03-11: qty 5

## 2017-03-11 MED ORDER — PHENYLEPHRINE HCL 10 MG/ML IJ SOLN
INTRAMUSCULAR | Status: DC | PRN
  Administered 2017-03-11 (×2): 80 ug via INTRAVENOUS

## 2017-03-11 MED ORDER — PROMETHAZINE HCL 25 MG/ML IJ SOLN
INTRAMUSCULAR | Status: AC
  Filled 2017-03-11: qty 1

## 2017-03-11 MED ORDER — PROPOFOL 500 MG/50ML IV EMUL
INTRAVENOUS | Status: AC
  Filled 2017-03-11: qty 50

## 2017-03-11 MED ORDER — SUCCINYLCHOLINE CHLORIDE 200 MG/10ML IV SOSY
PREFILLED_SYRINGE | INTRAVENOUS | Status: AC
  Filled 2017-03-11: qty 10

## 2017-03-11 MED ORDER — FENTANYL CITRATE (PF) 100 MCG/2ML IJ SOLN
25.0000 ug | INTRAMUSCULAR | Status: DC | PRN
  Administered 2017-03-11: 50 ug via INTRAVENOUS

## 2017-03-11 MED ORDER — SODIUM CHLORIDE 0.9 % IJ SOLN
INTRAMUSCULAR | Status: AC
  Filled 2017-03-11: qty 30

## 2017-03-11 MED ORDER — PROMETHAZINE HCL 25 MG/ML IJ SOLN
6.2500 mg | INTRAMUSCULAR | Status: DC | PRN
  Administered 2017-03-11: 6.25 mg via INTRAVENOUS

## 2017-03-11 MED ORDER — BUPIVACAINE-EPINEPHRINE (PF) 0.25% -1:200000 IJ SOLN
INTRAMUSCULAR | Status: AC
  Filled 2017-03-11: qty 30

## 2017-03-11 MED ORDER — MIDAZOLAM HCL 2 MG/2ML IJ SOLN
1.0000 mg | INTRAMUSCULAR | Status: DC | PRN
  Administered 2017-03-11: 2 mg via INTRAVENOUS

## 2017-03-11 MED ORDER — LIDOCAINE-EPINEPHRINE 1 %-1:100000 IJ SOLN
INTRAMUSCULAR | Status: AC
  Filled 2017-03-11: qty 1

## 2017-03-11 MED ORDER — DEXAMETHASONE SODIUM PHOSPHATE 10 MG/ML IJ SOLN
INTRAMUSCULAR | Status: AC
  Filled 2017-03-11: qty 1

## 2017-03-11 MED ORDER — SCOPOLAMINE 1 MG/3DAYS TD PT72
1.0000 | MEDICATED_PATCH | Freq: Once | TRANSDERMAL | Status: DC
  Administered 2017-03-11: 1.5 mg via TRANSDERMAL

## 2017-03-11 MED ORDER — BUPIVACAINE-EPINEPHRINE 0.25% -1:200000 IJ SOLN
INTRAMUSCULAR | Status: DC | PRN
  Administered 2017-03-11: 10 mL

## 2017-03-11 MED ORDER — ACETAMINOPHEN 500 MG PO TABS
1000.0000 mg | ORAL_TABLET | Freq: Once | ORAL | Status: AC
  Administered 2017-03-11: 1000 mg via ORAL

## 2017-03-11 MED ORDER — EPHEDRINE 5 MG/ML INJ
INTRAVENOUS | Status: AC
  Filled 2017-03-11: qty 10

## 2017-03-11 MED ORDER — PHENYLEPHRINE HCL 10 MG/ML IJ SOLN
INTRAMUSCULAR | Status: AC
  Filled 2017-03-11: qty 1

## 2017-03-11 MED ORDER — PHENYLEPHRINE 40 MCG/ML (10ML) SYRINGE FOR IV PUSH (FOR BLOOD PRESSURE SUPPORT)
PREFILLED_SYRINGE | INTRAVENOUS | Status: AC
  Filled 2017-03-11: qty 10

## 2017-03-11 MED ORDER — MIDAZOLAM HCL 2 MG/2ML IJ SOLN
INTRAMUSCULAR | Status: AC
  Filled 2017-03-11: qty 2

## 2017-03-11 MED ORDER — ONDANSETRON HCL 4 MG/2ML IJ SOLN
INTRAMUSCULAR | Status: DC | PRN
  Administered 2017-03-11: 4 mg via INTRAVENOUS

## 2017-03-11 MED ORDER — FENTANYL CITRATE (PF) 100 MCG/2ML IJ SOLN
50.0000 ug | INTRAMUSCULAR | Status: AC | PRN
  Administered 2017-03-11 (×4): 50 ug via INTRAVENOUS

## 2017-03-11 MED ORDER — PROPOFOL 10 MG/ML IV BOLUS
INTRAVENOUS | Status: DC | PRN
  Administered 2017-03-11: 150 mg via INTRAVENOUS

## 2017-03-11 MED ORDER — PHENYLEPHRINE HCL 10 MG/ML IJ SOLN
INTRAMUSCULAR | Status: DC | PRN
  Administered 2017-03-11: 40 ug/min via INTRAVENOUS

## 2017-03-11 MED ORDER — SODIUM CHLORIDE 0.9 % IV SOLN
INTRAVENOUS | Status: DC | PRN
  Administered 2017-03-11: 1000 mL

## 2017-03-11 MED ORDER — ONDANSETRON HCL 4 MG/2ML IJ SOLN
INTRAMUSCULAR | Status: AC
  Filled 2017-03-11: qty 2

## 2017-03-11 MED ORDER — MUPIROCIN CALCIUM 2 % EX CREA
TOPICAL_CREAM | CUTANEOUS | Status: AC
  Filled 2017-03-11: qty 15

## 2017-03-11 MED ORDER — MUPIROCIN CALCIUM 2 % EX CREA
TOPICAL_CREAM | CUTANEOUS | Status: DC | PRN
  Administered 2017-03-11: 1 via TOPICAL

## 2017-03-11 MED ORDER — FENTANYL CITRATE (PF) 100 MCG/2ML IJ SOLN
INTRAMUSCULAR | Status: AC
  Filled 2017-03-11: qty 2

## 2017-03-11 MED ORDER — LACTATED RINGERS IV SOLN
INTRAVENOUS | Status: DC
  Administered 2017-03-11 (×3): via INTRAVENOUS

## 2017-03-11 MED ORDER — EPHEDRINE SULFATE 50 MG/ML IJ SOLN
INTRAMUSCULAR | Status: DC | PRN
  Administered 2017-03-11: 10 mg via INTRAVENOUS
  Administered 2017-03-11: 15 mg via INTRAVENOUS

## 2017-03-11 MED ORDER — SCOPOLAMINE 1 MG/3DAYS TD PT72: 1.0000 | MEDICATED_PATCH | Freq: Once | TRANSDERMAL | Status: DC | PRN

## 2017-03-11 MED ORDER — LIDOCAINE 2% (20 MG/ML) 5 ML SYRINGE
INTRAMUSCULAR | Status: AC
  Filled 2017-03-11: qty 5

## 2017-03-11 MED ORDER — ACETAMINOPHEN 500 MG PO TABS
ORAL_TABLET | ORAL | Status: AC
  Filled 2017-03-11: qty 2

## 2017-03-11 MED ORDER — DEXAMETHASONE SODIUM PHOSPHATE 4 MG/ML IJ SOLN
INTRAMUSCULAR | Status: DC | PRN
  Administered 2017-03-11: 10 mg via INTRAVENOUS

## 2017-03-11 MED ORDER — SCOPOLAMINE 1 MG/3DAYS TD PT72
MEDICATED_PATCH | TRANSDERMAL | Status: AC
  Filled 2017-03-11: qty 1

## 2017-03-11 MED ORDER — CEFAZOLIN SODIUM-DEXTROSE 2-4 GM/100ML-% IV SOLN
INTRAVENOUS | Status: AC
  Filled 2017-03-11: qty 100

## 2017-03-11 SURGICAL SUPPLY — 77 items
ADH SKN CLS APL DERMABOND .7 (GAUZE/BANDAGES/DRESSINGS)
BAG DECANTER FOR FLEXI CONT (MISCELLANEOUS) ×6 IMPLANT
BINDER BREAST LRG (GAUZE/BANDAGES/DRESSINGS) IMPLANT
BINDER BREAST MEDIUM (GAUZE/BANDAGES/DRESSINGS) ×2 IMPLANT
BINDER BREAST XLRG (GAUZE/BANDAGES/DRESSINGS) IMPLANT
BINDER BREAST XXLRG (GAUZE/BANDAGES/DRESSINGS) IMPLANT
BIOPATCH RED 1 DISK 7.0 (GAUZE/BANDAGES/DRESSINGS) IMPLANT
BIOPATCH RED 1IN DISK 7.0MM (GAUZE/BANDAGES/DRESSINGS)
BLADE HEX COATED 2.75 (ELECTRODE) ×4 IMPLANT
BLADE SURG 10 STRL SS (BLADE) ×2 IMPLANT
BLADE SURG 15 STRL LF DISP TIS (BLADE) ×4 IMPLANT
BLADE SURG 15 STRL SS (BLADE) ×12
BNDG GAUZE ELAST 4 BULKY (GAUZE/BANDAGES/DRESSINGS) ×8 IMPLANT
CANISTER SUCT 1200ML W/VALVE (MISCELLANEOUS) ×4 IMPLANT
CHLORAPREP W/TINT 26ML (MISCELLANEOUS) ×4 IMPLANT
CLOSURE WOUND 1/2 X4 (GAUZE/BANDAGES/DRESSINGS)
CORD BIPOLAR FORCEPS 12FT (ELECTRODE) IMPLANT
COVER BACK TABLE 60X90IN (DRAPES) ×4 IMPLANT
COVER MAYO STAND STRL (DRAPES) ×4 IMPLANT
DECANTER SPIKE VIAL GLASS SM (MISCELLANEOUS) IMPLANT
DERMABOND ADVANCED (GAUZE/BANDAGES/DRESSINGS)
DERMABOND ADVANCED .7 DNX12 (GAUZE/BANDAGES/DRESSINGS) IMPLANT
DRAIN CHANNEL 19F RND (DRAIN) IMPLANT
DRAPE LAPAROSCOPIC ABDOMINAL (DRAPES) ×4 IMPLANT
DRSG PAD ABDOMINAL 8X10 ST (GAUZE/BANDAGES/DRESSINGS) ×8 IMPLANT
DRSG TEGADERM 2-3/8X2-3/4 SM (GAUZE/BANDAGES/DRESSINGS) IMPLANT
ELECT BLADE 4.0 EZ CLEAN MEGAD (MISCELLANEOUS) ×4
ELECT REM PT RETURN 9FT ADLT (ELECTROSURGICAL) ×4
ELECTRODE BLDE 4.0 EZ CLN MEGD (MISCELLANEOUS) ×2 IMPLANT
ELECTRODE REM PT RTRN 9FT ADLT (ELECTROSURGICAL) ×2 IMPLANT
EVACUATOR SILICONE 100CC (DRAIN) IMPLANT
GAUZE SPONGE 4X4 12PLY STRL (GAUZE/BANDAGES/DRESSINGS) IMPLANT
GAUZE SPONGE 4X4 12PLY STRL LF (GAUZE/BANDAGES/DRESSINGS) IMPLANT
GLOVE BIO SURGEON STRL SZ 6.5 (GLOVE) ×7 IMPLANT
GLOVE BIO SURGEON STRL SZ7 (GLOVE) ×2 IMPLANT
GLOVE BIO SURGEON STRL SZ7.5 (GLOVE) ×2 IMPLANT
GLOVE BIO SURGEONS STRL SZ 6.5 (GLOVE) ×3
GOWN STRL REUS W/ TWL LRG LVL3 (GOWN DISPOSABLE) ×4 IMPLANT
GOWN STRL REUS W/TWL LRG LVL3 (GOWN DISPOSABLE) ×8
IMPL SILICONE 480CC (Breast) IMPLANT
IMPLANT SILICONE 480CC (Breast) ×4 IMPLANT
IV NS 1000ML (IV SOLUTION)
IV NS 1000ML BAXH (IV SOLUTION) IMPLANT
IV NS 500ML (IV SOLUTION)
IV NS 500ML BAXH (IV SOLUTION) IMPLANT
KIT FILL SYSTEM UNIVERSAL (SET/KITS/TRAYS/PACK) IMPLANT
NDL HYPO 25X1 1.5 SAFETY (NEEDLE) ×2 IMPLANT
NDL SAFETY ECLIPSE 18X1.5 (NEEDLE) ×2 IMPLANT
NEEDLE HYPO 18GX1.5 SHARP (NEEDLE) ×4
NEEDLE HYPO 25X1 1.5 SAFETY (NEEDLE) ×4 IMPLANT
NS IRRIG 1000ML POUR BTL (IV SOLUTION) ×4 IMPLANT
PACK BASIN DAY SURGERY FS (CUSTOM PROCEDURE TRAY) ×4 IMPLANT
PENCIL BUTTON HOLSTER BLD 10FT (ELECTRODE) ×4 IMPLANT
PIN SAFETY STERILE (MISCELLANEOUS) IMPLANT
SIZER BREAST GEL REUSE 480CC (SIZER) ×4
SIZER BRST GEL REUSE 480CC (SIZER) IMPLANT
SLEEVE SCD COMPRESS KNEE MED (MISCELLANEOUS) ×4 IMPLANT
SPONGE LAP 18X18 X RAY DECT (DISPOSABLE) ×8 IMPLANT
STRIP CLOSURE SKIN 1/2X4 (GAUZE/BANDAGES/DRESSINGS) ×2 IMPLANT
STRIP SUTURE WOUND CLOSURE 1/2 (SUTURE) IMPLANT
SUT MNCRL AB 4-0 PS2 18 (SUTURE) ×8 IMPLANT
SUT MON AB 3-0 SH 27 (SUTURE) ×12
SUT MON AB 3-0 SH27 (SUTURE) ×2 IMPLANT
SUT MON AB 5-0 PS2 18 (SUTURE) ×8 IMPLANT
SUT PDS AB 2-0 CT2 27 (SUTURE) IMPLANT
SUT SILK 3 0 PS 1 (SUTURE) IMPLANT
SUT VIC AB 3-0 SH 27 (SUTURE)
SUT VIC AB 3-0 SH 27X BRD (SUTURE) IMPLANT
SUT VICRYL 4-0 PS2 18IN ABS (SUTURE) IMPLANT
SYR BULB IRRIGATION 50ML (SYRINGE) ×4 IMPLANT
SYR CONTROL 10ML LL (SYRINGE) ×4 IMPLANT
TAPE MEASURE VINYL STERILE (MISCELLANEOUS) ×2 IMPLANT
TOWEL OR 17X24 6PK STRL BLUE (TOWEL DISPOSABLE) ×8 IMPLANT
TUBE CONNECTING 20'X1/4 (TUBING) ×1
TUBE CONNECTING 20X1/4 (TUBING) ×3 IMPLANT
UNDERPAD 30X30 (UNDERPADS AND DIAPERS) ×8 IMPLANT
YANKAUER SUCT BULB TIP NO VENT (SUCTIONS) ×4 IMPLANT

## 2017-03-11 NOTE — Discharge Instructions (Signed)
May shower tomorrow Continue binder or sports bra No heavy lifting       Post Anesthesia Home Care Instructions  Activity: Get plenty of rest for the remainder of the day. A responsible individual must stay with you for 24 hours following the procedure.  For the next 24 hours, DO NOT: -Drive a car -Paediatric nurse -Drink alcoholic beverages -Take any medication unless instructed by your physician -Make any legal decisions or sign important papers.  Meals: Start with liquid foods such as gelatin or soup. Progress to regular foods as tolerated. Avoid greasy, spicy, heavy foods. If nausea and/or vomiting occur, drink only clear liquids until the nausea and/or vomiting subsides. Call your physician if vomiting continues.  Special Instructions/Symptoms: Your throat may feel dry or sore from the anesthesia or the breathing tube placed in your throat during surgery. If this causes discomfort, gargle with warm salt water. The discomfort should disappear within 24 hours.  If you had a scopolamine patch placed behind your ear for the management of post- operative nausea and/or vomiting:  1. The medication in the patch is effective for 72 hours, after which it should be removed.  Wrap patch in a tissue and discard in the trash. Wash hands thoroughly with soap and water. 2. You may remove the patch earlier than 72 hours if you experience unpleasant side effects which may include dry mouth, dizziness or visual disturbances. 3. Avoid touching the patch. Wash your hands with soap and water after contact with the patch.         Regional Anesthesia Blocks  1. Numbness or the inability to move the "blocked" extremity may last from 3-48 hours after placement. The length of time depends on the medication injected and your individual response to the medication. If the numbness is not going away after 48 hours, call your surgeon.  2. The extremity that is blocked will need to be protected until  the numbness is gone and the  Strength has returned. Because you cannot feel it, you will need to take extra care to avoid injury. Because it may be weak, you may have difficulty moving it or using it. You may not know what position it is in without looking at it while the block is in effect.  3. For blocks in the legs and feet, returning to weight bearing and walking needs to be done carefully. You will need to wait until the numbness is entirely gone and the strength has returned. You should be able to move your leg and foot normally before you try and bear weight or walk. You will need someone to be with you when you first try to ensure you do not fall and possibly risk injury.  4. Bruising and tenderness at the needle site are common side effects and will resolve in a few days.  5. Persistent numbness or new problems with movement should be communicated to the surgeon or the [place] [phone] [place] [phone]).

## 2017-03-11 NOTE — Op Note (Addendum)
Op report    DATE OF OPERATION:  03/11/2017  LOCATION: [place]  SURGICAL DIVISION: Plastic Surgery  PREOPERATIVE DIAGNOSES:  1. History of right breast cancer.  2. Acquired absence of right breast.  3. Left Breast asymmetry after surgery.  POSTOPERATIVE DIAGNOSES:  1. History of right breast cancer.  2. Acquired absence of right breast.  3. Left Breast asymmetry after surgery.  PROCEDURE:  1. Exchange of right tissue expander for implant. 2. Right Capsulotomies for implant respositioning. 3. Left mastopexy / reduction for symmetry; 280 gm removed  SURGEON: Claire Sanger Dillingham, DO  ASSISTANT: Dr. Heber Killen, MD  ANESTHESIA:  General.   COMPLICATIONS: None.   IMPLANTS: [employer] Smooth Round Ultra High Profile Gel 480cc. Ref #[id number].  Serial Number [id number]  INDICATIONS FOR PROCEDURE:  The patient, Melanie Moss, is a 41 y.o. female born on 09/29/1975, is here for treatment for further treatment after a mastectomy and placement of a tissue expander. She now presents for exchange of her expander for an implant.  She requires capsulotomies to better position the implant. MRN: [id number]  CONSENT:  Informed consent was obtained directly from the patient. Risks, benefits and alternatives were fully discussed. Specific risks including but not limited to bleeding, infection, hematoma, seroma, scarring, pain, implant infection, implant extrusion, capsular contracture, asymmetry, wound healing problems, and need for further surgery were all discussed. The patient did have an ample opportunity to have her questions answered to her satisfaction.   DESCRIPTION OF PROCEDURE:  The patient was taken to the operating room. SCDs were placed and IV antibiotics were given. The patient's chest was prepped and draped in a sterile fashion. A time out was performed and the implants to be used were identified.  One percent Xylocaine with epinephrine was used to  infiltrate the area.   Right: The old mastectomy scar was opened and superior mastectomy and inferior mastectomy flaps were re-raised over the pectoralis major muscle. The pectoralis was split to expose the tissue expander which was removed. Inspection of the pocket showed a normal healthy capsule and good integration of the biologic matrix.   Circumferential capsulotomies were performed to allow for breast pocket expansion.  There was a contour dysharmony at the inferior portion of the flap above the inframammary fold.  The capsule was therefore excised for 2 x 6 cm and this resolved the issue.  Measurements were made to confirm adequate pocket size for the implant dimensions.  Hemostasis was ensured with electrocautery.  The pocket was irrigated with antibiotic solution.  New gloves were placed.  The implant was placed in the pocket and oriented appropriately. The pectoralis major muscle and capsule on the anterior surface were re-closed with a 3-0 running Monocryl suture. The remaining skin was closed with 4-0 Monocryl deep dermal and 5-0 Monocryl subcuticular stitches.    Left:  Preoperative markings were confirmed.  Incision lines were injected with 1% Xylocaine with epinephrine.  After waiting for vasoconstriction, the marked lines were incised.  A Wise-pattern superomedial breast reduction was performed by de-epithelializing the pedicle, using bovie to create the superomedial pedicle, and removing breast tissue from the superior, lateral, and inferior portions of the breast.  Care was taken to not undermine the breast pedicle. Hemostasis was achieved.  The nipple was gently rotated into position and the soft tissue was closed with 4-0 Monocryl.  The patient was sat upright and size and shape symmetry was confirmed.  The pocket was irrigated and hemostasis confirmed.  The deep tissues  were approximated with 3-0 monocryl sutures and the skin was closed with deep dermal and subcuticular 4-0 Monocryl  sutures.  Dermabond was applied.  A breast binder and ABDs were placed.  The nipple and skin flaps had good capillary refill at the end of the procedure.  The patient tolerated the procedure well. The patient was allowed to wake from anesthesia and taken to the recovery room in satisfactory condition

## 2017-03-11 NOTE — Transfer of Care (Signed)
Immediate Anesthesia Transfer of Care Note  Patient: Melanie Moss  Procedure(s) Performed: REMOVAL OF RIGHT TISSUE EXPANDERS WITH PLACEMRIGHT SILICONE  BREAST IMPLANTS (Right Breast) MASTOPEXY FOR SYMENTRY (Left Breast)  Patient Location: PACU  Anesthesia Type:General  Level of Consciousness: awake, alert  and drowsy  Airway & Oxygen Therapy: Patient Spontanous Breathing and Patient connected to face mask oxygen  Post-op Assessment: Report given to RN and Post -op Vital signs reviewed and stable  Post vital signs: Reviewed and stable  Last Vitals:  Vitals:   03/11/17 0725  BP: 107/70  Pulse: 89  Resp: 18  Temp: 36.7 C  SpO2: 99%    Last Pain:  Vitals:   03/11/17 0725  TempSrc: Oral      Patients Stated Pain Goal: 0 (71/16/57 [id number])  Complications: No apparent anesthesia complications

## 2017-03-11 NOTE — Anesthesia Postprocedure Evaluation (Signed)
Anesthesia Post Note  Patient: Melanie Moss  Procedure(s) Performed: REMOVAL OF RIGHT TISSUE EXPANDERS WITH PLACEMRIGHT SILICONE  BREAST IMPLANTS (Right Breast) MASTOPEXY FOR SYMENTRY (Left Breast)     Patient location during evaluation: PACU Anesthesia Type: General Level of consciousness: awake and alert Pain management: pain level controlled Vital Signs Assessment: post-procedure vital signs reviewed and stable Respiratory status: spontaneous breathing, nonlabored ventilation, respiratory function stable and patient connected to nasal cannula oxygen Cardiovascular status: blood pressure returned to baseline and stable Postop Assessment: no apparent nausea or vomiting Anesthetic complications: no    Last Vitals:  Vitals:   03/11/17 1130 03/11/17 1145  BP: 112/72 108/70  Pulse: 92 91  Resp: 20 14  Temp:    SpO2: 100% 99%    Last Pain:  Vitals:   03/11/17 1117  TempSrc:   PainSc: Asleep                 Catalina Gravel

## 2017-03-11 NOTE — Anesthesia Procedure Notes (Signed)
Procedure Name: LMA Insertion Date/Time: 03/11/2017 8:41 AM Performed by: Willa Frater, CRNA Pre-anesthesia Checklist: Patient identified, Emergency Drugs available, Suction available and Patient being monitored Patient Re-evaluated:Patient Re-evaluated prior to induction Oxygen Delivery Method: Circle system utilized Preoxygenation: Pre-oxygenation with 100% oxygen Induction Type: IV induction Ventilation: Mask ventilation without difficulty LMA: LMA inserted LMA Size: 3.0 Number of attempts: 1 Airway Equipment and Method: Bite block Placement Confirmation: positive ETCO2 Tube secured with: Tape Dental Injury: Teeth and Oropharynx as per pre-operative assessment

## 2017-03-11 NOTE — Anesthesia Preprocedure Evaluation (Addendum)
Anesthesia Evaluation  Patient identified by MRN, date of birth, ID band Patient awake    Reviewed: Allergy & Precautions, NPO status , Patient's Chart, lab work & pertinent test results  Airway Mallampati: II  TM Distance: >3 FB Neck ROM: Full    Dental  (+) Teeth Intact, Dental Advisory Given   Pulmonary neg pulmonary ROS,    Pulmonary exam normal breath sounds clear to auscultation       Cardiovascular hypertension, Pt. on medications Normal cardiovascular exam Rhythm:Regular Rate:Normal     Neuro/Psych PSYCHIATRIC DISORDERS Anxiety negative neurological ROS     GI/Hepatic Neg liver ROS, GERD  ,  Endo/Other  negative endocrine ROS  Renal/GU negative Renal ROS     Musculoskeletal negative musculoskeletal ROS (+)   Abdominal   Peds  Hematology negative hematology ROS (+)   Anesthesia Other Findings Day of surgery medications reviewed with the patient.  Reproductive/Obstetrics (+) Pregnancy                             Anesthesia Physical Anesthesia Plan  ASA: II  Anesthesia Plan: General   Post-op Pain Management:    Induction: Intravenous  PONV Risk Score and Plan: 3 and Ondansetron, Dexamethasone, Midazolam and Scopolamine patch - Pre-op  Airway Management Planned: LMA  Additional Equipment:   Intra-op Plan:   Post-operative Plan: Extubation in OR  Informed Consent: I have reviewed the patients History and Physical, chart, labs and discussed the procedure including the risks, benefits and alternatives for the proposed anesthesia with the patient or authorized representative who has indicated his/her understanding and acceptance.   Dental advisory given  Plan Discussed with: CRNA  Anesthesia Plan Comments: (Risks/benefits of general anesthesia discussed with patient including risk of damage to teeth, lips, gum, and tongue, nausea/vomiting, allergic reactions to  medications, and the possibility of heart attack, stroke and death.  All patient questions answered.  Patient wishes to proceed.)       Anesthesia Quick Evaluation

## 2017-03-11 NOTE — Interval H&P Note (Signed)
History and Physical Interval Note:  03/11/2017 7:44 AM  [place]  has presented today for surgery, with the diagnosis of ACQUIRED ABSENCE OF RIGHT BREAST AND NIPPLE, BREAST ASSYMETRY FOLLONWING RECONSTRUCTION SURGERY  The various methods of treatment have been discussed with the patient and family. After consideration of risks, benefits and other options for treatment, the patient has consented to  Procedure(s): REMOVAL OF RIGHT TISSUE EXPANDERS WITH PLACEMRIGHT SILICONE  BREAST IMPLANTS (Right) MASTOPEXY FOR SYMENTRY (Left) as a surgical intervention .  The patient's history has been reviewed, patient examined, no change in status, stable for surgery.  I have reviewed the patient's chart and labs.  Questions were answered to the patient's satisfaction.     Loel Lofty Dillingham

## 2017-03-12 ENCOUNTER — Encounter (HOSPITAL_BASED_OUTPATIENT_CLINIC_OR_DEPARTMENT_OTHER): Payer: Self-pay | Admitting: Plastic Surgery

## 2017-04-02 NOTE — Progress Notes (Signed)
Melanie Moss  [phone] [phone]  Clinic Follow Up Note   Patient Care Team: Lucianne Lei, MD as PCP - General (Family Medicine) Excell Seltzer, MD as Consulting Physician (General Surgery) Truitt Merle, MD as Consulting Physician (Hematology) Gardenia Phlegm, NP as Nurse Practitioner (Hematology and Oncology) [name], MD as Consulting Physician (Radiation Oncology)   Date of Service:  04/04/2017  CHIEF COMPLAINTS:  Follow up right breast cancer  Oncology History   Breast cancer of lower-outer quadrant of right female breast [facility])   Staging form: Breast, AJCC 7th Edition     Clinical stage from 08/03/2015: Stage IA (T1c, N0, M0) - Unsigned     Pathologic stage from 09/29/2015: Stage IIIA (T3(m), N1a, cM0) - Signed by Truitt Merle, MD on 10/17/2015         Breast cancer of lower-outer quadrant of right female breast ([place])   07/25/2015 Mammogram    diagnostic mammogram and ultrasound showed a 11 mm mass in the 8:00 position of the right breast, small irregular lesion in the retroareolar regio of the right breast, contiguous with the dermis      07/26/2015 Initial Diagnosis    Breast cancer of lower-outer quadrant of right female breast ([place])      07/26/2015 Initial Biopsy    right breast needle core biopsy at 8:00 position (1), invasive ductal carcinoma, grade 3, 6:30 o'clock position invasive (2) and in situ ductal carcinoma, grade 1-2, lymphovascular invasion is identified      07/26/2015 Receptors her2    (1) ER 95% positive, PR 95% positive, Ki-67 30%, HER-2 positive with ratio 2.04, copy # 5.20, (2) ER 90% positive, PR 95% positive, Ki-67 10%, HER-2 negative.      08/02/2015 Imaging    breast MRI showed extensive mass  and non-mass enhancement in the lower outer quadrant of the right breast  spanning 13 cm, focal enhancement within the right nipple, at least 3-4 abnormal appearing right axillary lymph node.      09/29/2015  Surgery    right mastectomy and axillar node dissection       09/29/2015 Pathology Results    Right mastectomy and axillary node dissection showed multifocal mammary carcinoma with ductal and lobular features, tumor size 13 cm, 2.1 cm, 1.9 cm, and 0.5 cm, grade 2-3, anterior margins were positive, 2 nodes with metastasis and one with isolated cells      11/01/2015 - 02/14/2016 Chemotherapy    TCHP every 3 weeks, for 6 cycles, followed by herceptin maintenance therapy for a total of one year       03/06/2016 - 11/01/2016 Antibody Plan    Maintenance Herceptin and perjeta, every 3 weeks      03/07/2016 - 04/27/2016 Radiation Therapy    03/07/16-04/27/16 Site/dose: 1) 67fd right chest wall/ 45 Gy in 25 fractions 2) Right mastectomy scar / 16 Gy in 8 fractions      05/21/2016 -  Anti-estrogen oral therapy    Tamoxifen 20 mg      10/17/2016 Mammogram    IMPRESSION: No mammographic evidence of malignancy. A result letter of this screening mammogram will be mailed directly to the patient.      10/22/2016 Echocardiogram    Impressions:  - Normal study. Stable GLPSS at -21%. LVEF 60-65%.      11/09/2016 -  Anti-estrogen oral therapy     Neratinib '240mg'$  daily started on 11/09/2016      02/04/2017 Survivorship    [facility]  Charlestine Massed, NP      03/11/2017 Surgery    REMOVAL OF RIGHT TISSUE EXPANDERS WITH PLACEMRIGHT SILICONE  BREAST IMPLANTS and left MASTOPEXY FOR SYMENTRY by Dr. Marla Roe  03/11/17      03/11/2017 Pathology Results    Diagnosis 03/11/17 1. Breast, capsule, right inferior BENIGN FIBROVASCULAR TISSUE 2. Breast, Mammoplasty, left BENIGN BREAST TISSUE NEGATIVE FOR ATYPIA OR MALIGNANCY       HISTORY OF PRESENTING ILLNESS (08/03/2015):  Melanie Moss 41 y.o. female is here because of Her newly diagnosed right breast cancer. She is accompanied by her fianc, sister and mother to our multidisciplinary breast clinic today.  She felt a  breast lump and noticed mild intermittent breast pain 2 weeks ago, no nipple discharge or skin change. She also has moderate fatigue and dyspnea on moderate to heavy exertion for a few months, no weight loss, no other pain or other symptoms.  She had normal mammogram 6 years ago, for a small lump which felt to be benign and no biopsy was done.    She has irregular period, twice a month, it lasts about 5-7 days, heavy for 3 days, she changes 6-7 times a day. She was found to be anemic recently, and has started taking oral iron pill a few weeks ago, tolerates well.  GYN HISTORY  Menarchal: 10  LMP: 07/29/2015 Contraceptive: no  HRT: n/a  G4P1: 3 miscarriage, one 59 yo son, no plan to have more children   CURRENT THERAPY:  1.Tamoxifen 20 mg started 05/21/16. 2. Neratinib '240mg'$  daily started on 11/09/2016   INTERIM HISTORY:  Melanie Moss returns for follow-up post breast reconstruction. She presents to the clinic today noting things are going well for her. She notes the diarrhea with Nerlynx has been controlled to once daily with 4 imodium a day. She notes she loss weight recently due to lack of appetite. She notes she has been drinking ensure. She is willing to moderately increase fat and carbohydrates in her diet. She notes she is tolerating Tamoxifen well. She reports she has always been tired due to her anemia in addition to weight loss. She will continue taking her prenatal multivitamins. She reports a knot on her right axilla. This appeared a month ago. She thinks it may be a boil or scar tissue. She had her breast reconstruction with silicone implant 3 weeks ago. She reports she will have to go back to Dr. Marla Roe for another surgery. She notes her mood will feel down at times. She thinks she her mood is effected by her familial loss, cancer diagnosis and Tamoxifen. She notes her PCP did lab before her reconstruction surgery and showed she had high cholesterol. She is willing to get a flu shot today.  She denies having a period in months. No other bleeding she has noticed.       MEDICAL HISTORY:  Past Medical History:  Diagnosis Date  . Anemia   . Anxiety   . Breast cancer of lower-outer quadrant of right female breast ([place]) 07/29/2015  . Family history of breast cancer   . Family history of colon cancer   . Family history of prostate cancer   . GERD (gastroesophageal reflux disease)    tums   . History of radiation therapy 03/07/16-04/27/16   right chest wall 45 Gy in 25 fractions, right mastectomy scar 16 Gy in 8 fractions  . Hyperlipidemia   . Hypertension    not currently on medication  . Personal history of chemotherapy   .  Personal history of radiation therapy   . Preterm labor    SURGICAL HISTORY: Past Surgical History:  Procedure Laterality Date  . AXILLARY LYMPH NODE DISSECTION Right 09/29/2015   Procedure: AXILLARY LYMPH NODE DISSECTION;  Surgeon: Excell Seltzer, MD;  Location: [place];  Service: General;  Laterality: Right;  . BREAST RECONSTRUCTION WITH PLACEMENT OF TISSUE EXPANDER AND FLEX HD (ACELLULAR HYDRATED DERMIS) Right 09/29/2015   Procedure: BREAST RECONSTRUCTION WITH PLACEMENT OF TISSUE EXPANDER AND FLEX HD (ACELLULAR HYDRATED DERMIS);  Surgeon: Loel Lofty Dillingham, DO;  Location: [place];  Service: Plastics;  Laterality: Right;  . CERVICAL CERCLAGE  02/19/2011   Procedure: CERCLAGE CERVICAL;  Surgeon: Agnes Lawrence, MD;  Location: [facility];  Service: Gynecology;  Laterality: N/A;  . DILATION AND CURETTAGE OF UTERUS    . MASTECTOMY     right 2017  . MASTECTOMY WITH AXILLARY LYMPH NODE DISSECTION Right 09/29/2015  . MASTOPEXY Left 03/11/2017   Procedure: MASTOPEXY FOR SYMENTRY;  Surgeon: Wallace Going, DO;  Location: [place];  Service: Plastics;  Laterality: Left;  . PORTACATH PLACEMENT N/A 09/29/2015   Procedure: INSERTION PORT-A-CATH;  Surgeon: Excell Seltzer, MD;  Location: [place];  Service: General;  Laterality: N/A;  . REMOVAL  OF BILATERAL TISSUE EXPANDERS WITH PLACEMENT OF BILATERAL BREAST IMPLANTS Right 03/11/2017   Procedure: REMOVAL OF RIGHT TISSUE EXPANDERS WITH PLACEMRIGHT SILICONE  BREAST IMPLANTS;  Surgeon: Wallace Going, DO;  Location: [place];  Service: Plastics;  Laterality: Right;  . SIMPLE MASTECTOMY WITH AXILLARY SENTINEL NODE BIOPSY Right 09/29/2015   Procedure: RIGHT TOTAL  MASTECTOMY WITH AXILLARY SENTINEL NODE BIOPSY;  Surgeon: Excell Seltzer, MD;  Location: [place];  Service: General;  Laterality: Right;   SOCIAL HISTORY: Social History   Socioeconomic History  . Marital status: Single    Spouse name: Not on file  . Number of children: 1  . Years of education: Not on file  . Highest education level: Not on file  Social Needs  . Financial resource strain: Not on file  . Food insecurity - worry: Not on file  . Food insecurity - inability: Not on file  . Transportation needs - medical: Not on file  . Transportation needs - non-medical: Not on file  Occupational History  . Not on file  Tobacco Use  . Smoking status: Never Smoker  . Smokeless tobacco: Never Used  Substance and Sexual Activity  . Alcohol use: No  . Drug use: No  . Sexual activity: Yes    Birth control/protection: None  Other Topics Concern  . Not on file  Social History Narrative  . Not on file   FAMILY HISTORY: Family History  Problem Relation Age of Onset  . Hypertension Mother   . Hypertension Father   . Prostate cancer Father 4  . Breast cancer Maternal Aunt 10  . Breast cancer Paternal Aunt 15  . Cirrhosis Maternal Uncle   . Prostate cancer Paternal Uncle   . Throat cancer Maternal Aunt        non smoker  . Prostate cancer Paternal Uncle   . Colon cancer Cousin        maternal first cousin dx in his 26s  . Lung cancer Cousin        smoker   ALLERGIES:  has No Known Allergies.  MEDICATIONS:  Current Outpatient Medications  Medication Sig Dispense Refill  . B Complex-C  (SUPER B COMPLEX PO) Take 1 tablet by mouth daily.    Marland Kitchen  clonazePAM (KLONOPIN) 0.5 MG tablet Take 0.5 mg by mouth 2 (two) times daily.  0  . diazepam (VALIUM) 2 MG tablet Take 2 mg every 6 (six) hours as needed by mouth for anxiety.    Water engineer Bandages & Supports (KNEE COMPRESSION SLEEVE/L/XL) MISC   0  . ferrous sulfate 325 (65 FE) MG tablet Take 325 mg by mouth 2 (two) times daily with a meal.    . irbesartan-hydrochlorothiazide (AVALIDE) 150-12.5 MG tablet Take 1 tablet daily by mouth.    . Multiple Vitamin (MULTIVITAMIN) tablet Take 1 tablet by mouth daily.    . Neratinib Maleate (NERLYNX) 40 MG tablet Take 6 tablets (240 mg total) by mouth daily. Take with food. 180 tablet 3  . ondansetron (ZOFRAN-ODT) 4 MG disintegrating tablet Take 4 mg by mouth every 8 (eight) hours as needed for nausea or vomiting. Reported on 11/01/2015  0  . tamoxifen (NOLVADEX) 20 MG tablet Take 1 tablet (20 mg total) by mouth daily. 90 tablet 3  . diphenoxylate-atropine (LOMOTIL) 2.5-0.025 MG tablet TAKE 1 TO 2 TABLETS BY MOUTH 4 TIMES DAILY AS NEEDED FOR DIARRHEA OR LOOSE STOOLS (Patient not taking: Reported on 04/04/2017) 30 tablet 1  . hydrochlorothiazide (HYDRODIURIL) 25 MG tablet Take 1 tablet (25 mg total) by mouth daily. 30 tablet 6  . mirtazapine (REMERON) 15 MG tablet Take 1 tablet (15 mg total) by mouth at bedtime. 30 tablet 2   No current facility-administered medications for this visit.    Facility-Administered Medications Ordered in Other Visits  Medication Dose Route Frequency Provider Last Rate Last Dose  . 0.9 %  sodium chloride infusion   Intravenous Once Truitt Merle, MD      . alteplase (CATHFLO ACTIVASE) injection 2 mg  2 mg Intracatheter Once PRN Truitt Merle, MD      . heparin lock flush 100 unit/mL  250 Units Intracatheter Once PRN Truitt Merle, MD      . heparin lock flush 100 unit/mL  500 Units Intravenous Once PRN Truitt Merle, MD      . sodium chloride 0.9 % injection 10 mL  10 mL Intracatheter PRN  Truitt Merle, MD      . sodium chloride 0.9 % injection 10 mL  10 mL Intravenous PRN Truitt Merle, MD      . sodium chloride 0.9 % injection 3 mL  3 mL Intravenous Once PRN Truitt Merle, MD      . sodium chloride flush (NS) 0.9 % injection 10 mL  10 mL Intravenous PRN Truitt Merle, MD        REVIEW OF SYSTEMS:   Constitutional: Denies fevers, chills  (+) weight loss (+) loss of appetite  (+) fatigue  Eyes:Denies blurry vision, double vision or watery eyes Ears, nose, mouth, throat, and face: Denies mucositis or sore throat Respiratory: Denies cough, dyspnea or wheezes  Cardiovascular: Denies palpitation, chest discomfort or lower extremity swelling Gastrointestinal:  Denies nausea, heartburn, (+) diarrhea once daily, controlled  Skin: Denies abnormal skin rashes Lymphatics: Denies new lymphadenopathy or easy bruising  Neurological:Denies numbness, tingling or new weaknesses  Behavioral/Psych: Mood is stable, no new changes  All other systems were reviewed with the patient and are negative. Breast: (+) knot in right axilla  PHYSICAL EXAMINATION:  ECOG PERFORMANCE STATUS: 1   Vitals:   04/04/17 0937  BP: 134/83  Pulse: 87  Resp: 20  Temp: 97.9 F (36.6 C)  SpO2: 100%   Filed Weights   04/04/17 0937  Weight: 135  lb 14.4 oz (61.6 kg)     GENERAL:alert, no distress and comfortable SKIN: skin color, texture, turgor are normal, Several scattered acne-like rash in her upper front chest.  EYES: normal, conjunctiva are pink and non-injected, sclera clear OROPHARYNX:no exudate, no erythema and lips, buccal mucosa, and tongue normal  NECK: supple, thyroid normal size, non-tender, without nodularity LYMPH:  no palpable lymphadenopathy in the cervical, axillary or inguinal LUNGS: clear to auscultation and percussion with normal breathing effort HEART: regular rate & rhythm and no murmurs and no lower extremity edema ABDOMEN:abdomen soft, non-tender and normal bowel sounds Musculoskeletal:no  cyanosis of digits and no clubbing  PSYCH: alert & oriented x 3 with fluent speech NEURO: no focal motor/sensory deficits Breasts:  Status post right mastectomy and breast reconstruction, surgical scar has healed well. (+) Diffuse skin pigmentation of the right breast secondary to radiation, no skin ulcers or discharge. Palpation of the left breast and bilateral axilla showed no palpable mass.  LABORATORY DATA:  I have reviewed the data as listed CBC Latest Ref Rng & Units 04/04/2017 03/11/2017 12/07/2016  WBC 3.9 - 10.3 10e3/uL 6.4 - 5.9  Hemoglobin 11.6 - 15.9 g/dL 10.5(L) 11.9(L) 10.1(L)  Hematocrit 34.8 - 46.6 % 31.3(L) - 30.8(L)  Platelets 145 - 400 10e3/uL 291 - 211    CMP Latest Ref Rng & Units 04/04/2017 03/06/2017 12/07/2016  Glucose 70 - 140 mg/dl 92 111(H) 78  BUN 7.0 - 26.0 mg/dL 11.3 11 9.7  Creatinine 0.6 - 1.1 mg/dL 1.0 1.09(H) 0.9  Sodium 136 - 145 mEq/L 141 140 143  Potassium 3.5 - 5.1 mEq/L 3.8 3.0(L) 3.7  Chloride 101 - 111 mmol/L - 104 -  CO2 22 - 29 mEq/L '28 28 29  '$ Calcium 8.4 - 10.4 mg/dL 9.7 9.7 9.4  Total Protein 6.4 - 8.3 g/dL 7.2 - 6.7  Total Bilirubin 0.20 - 1.20 mg/dL 0.39 - 0.37  Alkaline Phos 40 - 150 U/L 56 - 48  AST 5 - 34 U/L 41(H) - 55(H)  ALT 0 - 55 U/L 53 - 88(H)    PATHOLOGY:  Diagnosis 03/11/17 1. Breast, capsule, right inferior BENIGN FIBROVASCULAR TISSUE 2. Breast, Mammoplasty, left BENIGN BREAST TISSUE NEGATIVE FOR ATYPIA OR MALIGNANCY   Diagnosis 09/29/2015 1. Lymph node, sentinel, biopsy, Right axillary #1 - ONE BENIGN LYMPH NODE WITH NO TUMOR SEEN (0/1). 2. Lymph node, sentinel, biopsy, Right axillary #2 - ONE LYMPH NODE POSITIVE FOR METASTATIC MAMMARY CARCINOMA (1/1). 3. Lymph node, sentinel, biopsy, Right axillary #4 - ONE BENIGN LYMPH NODE WITH NO TUMOR SEEN (0/1). 4. 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 5. Breast, simple mastectomy, Right  breast-suture marks axillary tail - MULTIFOCAL MAMMARY CARCINOMA WITH DUCTAL AND LOBULAR FEATURES. - LARGEST FOCUS OF INVASIVE MAMMARY CARCINOMA IS GRADE 2, SHOWS ASSOCIATED LOBULAR CARCINOMA IN SITU AND MEASURES 13 CM IN GREATEST DIMENSION. - SECOND LARGEST FOCUS SHOWS INVASIVE GRADE 3 MAMMARY CARCINOMA AND SPANS 2.1 CM IN GREATEST DIMENSION. - THIRD LARGEST FOCUS SHOWS INVASIVE GRADE 2 MAMMARY CARCINOMA WITH LOBULAR CARCINOMA IN SITU AND MEASURES 1.9 CM IN GREATEST DIMENSION. - SMALLEST FOCUS SHOWS INVASIVE GRADE 3 MAMMARY CARCINOMA AND MEASURES 0.5 CM IN GREATEST DIMENSION. - ANTERIOR INFERIOR SOFT TISSUE MARGIN DEMONSTRATES BROAD POSITIVITY FOR INVASIVE MAMMARY CARCINOMA. - OTHER MARGINS ARE NEGATIVE. - SEE ONCOLOGY TEMPLATE. 6. Lymph node, sentinel, biopsy, Right axillary #3 - ONE BENIGN LYMPH NODE WITH NO TUMOR SEEN (0/1). 7. Lymph node, sentinel, biopsy, Right  axillary #6 - ONE LYMPH NODE WITH ISOLATED TUMOR CELLS. 8. Lymph nodes, regional resection, Right axillary contents - ELEVEN BENIGN LYMPH NODES WITH NO TUMOR SEEN (0/11). Specimen, including laterality and lymph node sampling (sentinel, non-sentinel): Right breast with right sentinel and non-sentinel lymph nodes. Procedure: Right modified mastectomy with sentinel lymph node biopsy and axillary dissection. Histologic type: Multifocal mammary carcinoma with ductal and lobular features. Largest focus (13 cm): Grade: 2. Tubule formation: 3. Nuclear pleomorphism: 2. Mitotic: 1. Second largest focus (2.1 cm): Grade: 3. Tubule formation: 3. Nuclear pleomorphism: 2. Mitotic: 3. Third largest focus (1.9 cm): Grade: 2. Tubule formation: 3. Nuclear pleomorphism: 2. Mitotic: 1. Smallest focus (0.5 cm): Grade: 3. Tubule formation: 3. Nuclear pleomorphism: 2. Mitotic: 3. Tumor size (gross measurements): 13 cm, 2.1 cm, 1.9 cm and 0.5 cm. Margins: Invasive, distance to closest margin: Invasive mammary carcinoma broadly  involves the anterior inferior soft tissue margin. In-situ, distance to closest margin: Margin uninvolved by in situ carcinoma. If margin positive, focally or broadly: Broadly positive. Lymphovascular invasion: Yes, identified. Ductal carcinoma in situ: Not identified. Grade: Not applicable. Extensive intraductal component: Not applicable. Lobular neoplasia: Yes, lobular carcinoma in situ is present. Tumor focality: Multifocal. Treatment effect: Not applicable. If present, treatment effect in breast tissue, lymph nodes or both: Not applicable. Extent of tumor: Skin: Not involved. Nipple: Not involved. Skeletal muscle: Not received. Lymph nodes: Examined: 6 Sentinel. 11 Non-sentinel. 17 Total. Lymph nodes with metastasis: 2 with metastasis and 1 with isolated tumor cells. Isolated tumor cells (< 0.2 mm): 1. Micrometastasis: (> 0.2 mm and < 2.0 mm): 0. Macrometastasis: (> 2.0 mm): 2. Extracapsular extension: Not identified.  Breast prognostic profile: Performed on previous case, [id number]. Right 8 o'clock biopsy 12 cm from the nipple: Estrogen receptor: 95%, positive. Progesterone receptor: 95%, positive. Her-2 neu: 2.04 ratio, positive. Ki-67: 30%. Right breast needle core biopsy 6:30 o'clock position 1 cm from the nipple: Estrogen receptor: 90%, positive. Progesterone receptor: 95%, positive. Her-2 neu: 1.39 ratio, negative. Ki-67: 10%. Non-neoplastic breast: No significant non-neoplastic breast findings.  TNM: pT3(m), pN1a. Comments: A cytokeratin AE1/AE3 immunohistochemical stain is performed on eight blocks containing lymph node tissue (eight stains total). The morphology coupled with the staining pattern is consistent with the above findings. An E-cadherin immunohistochemical stain is also performed on four blocks from the right simple mastectomy specimen. The staining pattern coupled with the morphology is consistent with the above findings. As there was Her-2 neu  positivity in one of the biopsies and both biopsies were positive for estrogen receptor, a repeat breast prognostic profile will not be performed unless otherwise requested. Dr. Lyndon Code has seen selected slides 5E, 58F, 5G, 5J, 5N, 5O, and 5P with agreement that the tumor is a mulitfocal invasive mammary carcinoma with a mixed ductal and lobular phenotype. Dr. Tresa Moore has also seen a selected slide (5P) with agreement of the positive anterior inferior margin. (RH:ecj 10/03/2015)   RADIOGRAPHIC STUDIES: I have personally reviewed the radiological images as listed and agreed with the findings in the report. No new scans  CT chest, abdomen and pelvis  10/27/2015 IMPRESSION: Status post right mastectomy and right axillary lymph node dissection.  No findings specific for metastatic disease.  Scattered hepatic cysts measuring up to 12 mm. A single 8 mm hypoenhancing lesion is technically indeterminate, but also likely benign, although motion degraded. Consider attention on follow-up.  Bone scan 10/28/2015 IMPRESSION: Small focus of increased radiotracer uptake within the right lateral skull at the region of the right temporal  bone. CT of the head may be considered for further evaluation.  Otherwise no evidence of osseous metastatic disease in the axial skeleton.  CT head with and without contrast 11/07/2015 IMPRESSION: 1. Indeterminate 10-11 mm area of heterogeneous bone mineralization along the right sphenoid wing which may correspond to the recent bone scan finding. This might be physiologic. A Repeat CT head (e.g. in 8-12 weeks, Noncontrast should suffice but with and without contrast could be done if repeat brain staging is desired) may be most valuable to evaluate stability. 2. No CT evidence of metastatic disease to the brain. Mild nonspecific white matter changes.  Echo 04/10/2016 Impressions:  - Normal LV size with EF 65-70%. Strain as above. Normal RV size   and systolic  function. No significant valvular abnormalities.  Mammogram 10/17/2016 IMPRESSION: No mammographic evidence of malignancy. A result letter of this screening mammogram will be mailed directly to the patient.  ASSESSMENT & PLAN:  41 y.o. African-American female, premenopausal, presented with a palpable right breast mass.  1. 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 Her initial biopsy showed one triple positive disease, the other was ER/PR positive and HER2 Negative disease. -Her staging scans were negative for distant metastasis. -We previously discussed that HER-2 positive breast cancer are more aggressive, with a high risk for recurrence after surgery, especially in the setting of locally advanced stage.  -she completed adjuvant chemotherapy TCHP (docetaxel, carboplatin, Herceptin, pejeta) every 3 weeks for total of 6 cycles, followed by Herceptin maintenance therapy to complete 1 year treatment. -She underwent adjuvant radiation therapy by Dr. Sondra Come 03/07/16-04/27/2016. Tolerated well. -She has started adjuvant antiestrogen therapy with tamoxifen on 05/21/16, tolerating well overall, with moderate hot flashes that have now resolved. -The benefit of ovarian suppression is less clear in HER2 positive disease. -she has started Neratinib on 11/09/2016 with main side effect of moderate diarrhea, which has much improved lately. She has been using Imodium, not needing Lomotil. -She had breast reconstruction by Dr. Marla Roe on 03/11/17. She is healing well and plans to return for another surgery early next year.  -She is clinically doing well. Lab reviewed, her CBC show mild anemia with  Hg at 10.5 and CMP WNL. Her breast exam post reconstruction shows likely boil in her right axilla area, otherwise unremarkable exam. I encouraged her to monitor this for any infection. There is no clinical concern for recurrence. -Continue breast cancer surveillance. Next mammogram in 09/2017. She is tolerating Tamoxifen and Nerlynx well, she will continue. I refilled for her today. -f/u in 4 months  -I offered the flu shot today and she opted for it today.     2. Iron deficient anemia, and anemia of chronic disease  -Her lab tests showed a mild to moderate anemia. -Her anemia previously resolved after IV Feraheme, slightly worse after she started chemotherapy. -I previously encouraged her to continue oral iron pill -Her repeated iron level and a ferritin have normalized.  -She has required blood transfusion in the past - Her menstrual cycle has returned. I previously encouraged her to take her iron pills twice daily for the next 3 months then once daily. -Her menses have been very heavy, she has been taking Iron supplements.  -12/07/16 iron studies  showed normal ferritin, low TIBC, consistent with anemia of chronic disease    3. Genetic -Given her young age and positive family history, we strongly recommend her to [facility] genetic counseling for genetic testing to ruled out inheritable breast cancer syndrome. She agrees. -Her genetic testing was normal  4.  HTN -She was noticed to have significant elevated blood pressure lately  -She has previously seen her primary care physician, and her blood pressure medication has been adjusted, including HCTZ  5. Right arm lymphedema  -Secondary to breast surgery and lymph node biopsy -improved  -She is using compression cast for her right arm. -She has a pump from PT and she says it swells more on the pump, will F/u with PT for more help -Continue with compression sleeves and manual pump.   6. Hot flashes and mood swings. -The Effexor made her feel  loopy and she has stopped taking it. -I previously started her on Neurontin for her nocturnal hot flash and peripheral neuropathy, but she only took it once due to feeling loopy. -Hot flashes have now resolved -Due to her depression and low appetite, I recommend her to try mirtazapine, will start her on 15 mg at bedtime.  Potential side effects and benefits discussed, she is willing to proceed.  7. Diarrhea -Related to Neratinib. I will place her on Lomotil to help aid with this.  -controlled to one diarrhea episode a day with 4 lomotil daily.   8. Low appetite, weight loss -Given her recent weight loss due to no appetite and overall down mood, I recommend low to moderate dose Mirtazapine. I discussed the side effects with her and she agreed to try it so I called it on 04/04/17    Plan -Flu shot today  -Refill Tamoxifen and Nerlynx today  -Prescribe Mirtazapine today  -Lab and f/u in 4 months    I spent 25 minutes counseling the patient face to face. The total time spent in the appointment was 30 minutes and more than 50% was on counseling.   This document serves as a record of services personally performed by Truitt Merle, MD. It was created on her behalf by Joslyn Devon, a trained medical scribe. The creation of this record is based on the scribe's personal observations and the provider's statements to them.    I have reviewed the above documentation for accuracy and completeness, and I agree with the above.   Truitt Merle, MD 04/04/2017

## 2017-04-04 ENCOUNTER — Other Ambulatory Visit (HOSPITAL_BASED_OUTPATIENT_CLINIC_OR_DEPARTMENT_OTHER): Payer: BLUE CROSS/BLUE SHIELD

## 2017-04-04 ENCOUNTER — Ambulatory Visit: Payer: BLUE CROSS/BLUE SHIELD | Admitting: Hematology

## 2017-04-04 ENCOUNTER — Telehealth: Payer: Self-pay | Admitting: Hematology

## 2017-04-04 ENCOUNTER — Encounter: Payer: Self-pay | Admitting: Hematology

## 2017-04-04 VITALS — BP 134/83 | HR 87 | Temp 97.9°F | Resp 20 | Ht 63.0 in | Wt 135.9 lb

## 2017-04-04 DIAGNOSIS — D509 Iron deficiency anemia, unspecified: Secondary | ICD-10-CM

## 2017-04-04 DIAGNOSIS — N951 Menopausal and female climacteric states: Secondary | ICD-10-CM | POA: Diagnosis not present

## 2017-04-04 DIAGNOSIS — Z170 Estrogen receptor positive status [ER+]: Secondary | ICD-10-CM | POA: Diagnosis not present

## 2017-04-04 DIAGNOSIS — Z23 Encounter for immunization: Secondary | ICD-10-CM

## 2017-04-04 DIAGNOSIS — Z79810 Long term (current) use of selective estrogen receptor modulators (SERMs): Secondary | ICD-10-CM

## 2017-04-04 DIAGNOSIS — D500 Iron deficiency anemia secondary to blood loss (chronic): Secondary | ICD-10-CM

## 2017-04-04 DIAGNOSIS — C50511 Malignant neoplasm of lower-outer quadrant of right female breast: Secondary | ICD-10-CM

## 2017-04-04 DIAGNOSIS — I10 Essential (primary) hypertension: Secondary | ICD-10-CM

## 2017-04-04 LAB — COMPREHENSIVE METABOLIC PANEL
ALT: 53 U/L (ref 0–55)
AST: 41 U/L — ABNORMAL HIGH (ref 5–34)
Albumin: 3.6 g/dL (ref 3.5–5.0)
Alkaline Phosphatase: 56 U/L (ref 40–150)
Anion Gap: 9 mEq/L (ref 3–11)
BUN: 11.3 mg/dL (ref 7.0–26.0)
CO2: 28 mEq/L (ref 22–29)
Calcium: 9.7 mg/dL (ref 8.4–10.4)
Chloride: 104 mEq/L (ref 98–109)
Creatinine: 1 mg/dL (ref 0.6–1.1)
EGFR: >60 mL/min/{1.73_m2} (ref >60)
Glucose: 92 mg/dl (ref 70–140)
Potassium: 3.8 mEq/L (ref 3.5–5.1)
Sodium: 141 mEq/L (ref 136–145)
Total Bilirubin: 0.39 mg/dL (ref 0.20–1.20)
Total Protein: 7.2 g/dL (ref 6.4–8.3)

## 2017-04-04 LAB — CBC WITH DIFFERENTIAL/PLATELET
BASO%: 0.6 % (ref 0.0–2.0)
Basophils Absolute: 0 10*3/uL (ref 0.0–0.1)
EOS%: 6 % (ref 0.0–7.0)
Eosinophils Absolute: 0.4 10*3/uL (ref 0.0–0.5)
HCT: 31.3 % — ABNORMAL LOW (ref 34.8–46.6)
HGB: 10.5 g/dL — ABNORMAL LOW (ref 11.6–15.9)
LYMPH%: 26.7 % (ref 14.0–49.7)
MCH: 30.7 pg (ref 25.1–34.0)
MCHC: 33.4 g/dL (ref 31.5–36.0)
MCV: 92 fL (ref 79.5–101.0)
MONO#: 0.4 10*3/uL (ref 0.1–0.9)
MONO%: 6.6 % (ref 0.0–14.0)
NEUT#: 3.8 10*3/uL (ref 1.5–6.5)
NEUT%: 60.1 % (ref 38.4–76.8)
Platelets: 291 10*3/uL (ref 145–400)
RBC: 3.41 10*6/uL — ABNORMAL LOW (ref 3.70–5.45)
RDW: 13.8 % (ref 11.2–14.5)
WBC: 6.4 10*3/uL (ref 3.9–10.3)
lymph#: 1.7 10*3/uL (ref 0.9–3.3)

## 2017-04-04 LAB — IRON AND TIBC
%SAT: 31 % (ref 21–57)
Iron: 64 ug/dL (ref 41–142)
TIBC: 205 ug/dL — ABNORMAL LOW (ref 236–444)
UIBC: 141 ug/dL (ref 120–384)

## 2017-04-04 LAB — FERRITIN: Ferritin: 543 ng/ml — ABNORMAL HIGH (ref 9–269)

## 2017-04-04 MED ORDER — INFLUENZA VAC SPLIT QUAD 0.5 ML IM SUSY
0.5000 mL | PREFILLED_SYRINGE | Freq: Once | INTRAMUSCULAR | Status: AC
  Administered 2017-04-04: 0.5 mL via INTRAMUSCULAR
  Filled 2017-04-04: qty 0.5

## 2017-04-04 MED ORDER — MIRTAZAPINE 15 MG PO TABS: 15.0000 mg | ORAL_TABLET | Freq: Every day | ORAL | 2 refills | Status: DC

## 2017-04-04 MED ORDER — TAMOXIFEN CITRATE 20 MG PO TABS: 20.0000 mg | ORAL_TABLET | Freq: Every day | ORAL | 3 refills | Status: DC

## 2017-04-04 MED ORDER — NERATINIB MALEATE 40 MG PO TABS: 240.0000 mg | ORAL_TABLET | Freq: Every day | ORAL | 3 refills | Status: DC

## 2017-04-04 NOTE — Telephone Encounter (Signed)
Gave avs and calendar for April 2019 °

## 2017-04-08 ENCOUNTER — Telehealth: Payer: Self-pay | Admitting: *Deleted

## 2017-04-08 NOTE — Telephone Encounter (Signed)
-----   Message from Truitt Merle, MD sent at 04/07/2017 11:13 AM EST ----- Please let pt know her iron level was normal from last week, thanks  Truitt Merle

## 2017-04-08 NOTE — Telephone Encounter (Signed)
Spoke with pt and informed pt of iron level normal as per Dr. Ernestina Penna instructions.  Pt voiced understanding.

## 2017-04-11 ENCOUNTER — Ambulatory Visit: Payer: Self-pay | Admitting: General Surgery

## 2017-04-18 DIAGNOSIS — N6489 Other specified disorders of breast: Secondary | ICD-10-CM | POA: Insufficient documentation

## 2017-04-19 ENCOUNTER — Ambulatory Visit (HOSPITAL_BASED_OUTPATIENT_CLINIC_OR_DEPARTMENT_OTHER)
Admission: RE | Admit: 2017-04-19 | Discharge: 2017-04-19 | Disposition: A | Discharge status: Home / Self Care | Payer: BLUE CROSS/BLUE SHIELD | Source: Ambulatory Visit | Attending: Plastic Surgery | Admitting: Plastic Surgery

## 2017-04-19 ENCOUNTER — Encounter (HOSPITAL_BASED_OUTPATIENT_CLINIC_OR_DEPARTMENT_OTHER)
Admission: RE | Disposition: A | Discharge status: Home / Self Care | Payer: Self-pay | Source: Ambulatory Visit | Attending: Plastic Surgery

## 2017-04-19 ENCOUNTER — Ambulatory Visit: Payer: Self-pay | Admitting: Plastic Surgery

## 2017-04-19 ENCOUNTER — Ambulatory Visit (HOSPITAL_BASED_OUTPATIENT_CLINIC_OR_DEPARTMENT_OTHER): Payer: BLUE CROSS/BLUE SHIELD | Admitting: Anesthesiology

## 2017-04-19 ENCOUNTER — Encounter (HOSPITAL_BASED_OUTPATIENT_CLINIC_OR_DEPARTMENT_OTHER): Payer: Self-pay | Admitting: Certified Registered Nurse Anesthetist

## 2017-04-19 ENCOUNTER — Other Ambulatory Visit: Payer: Self-pay

## 2017-04-19 DIAGNOSIS — I10 Essential (primary) hypertension: Secondary | ICD-10-CM | POA: Insufficient documentation

## 2017-04-19 DIAGNOSIS — Z79810 Long term (current) use of selective estrogen receptor modulators (SERMs): Secondary | ICD-10-CM | POA: Insufficient documentation

## 2017-04-19 DIAGNOSIS — D649 Anemia, unspecified: Secondary | ICD-10-CM | POA: Insufficient documentation

## 2017-04-19 DIAGNOSIS — Z853 Personal history of malignant neoplasm of breast: Secondary | ICD-10-CM | POA: Diagnosis not present

## 2017-04-19 DIAGNOSIS — Z923 Personal history of irradiation: Secondary | ICD-10-CM | POA: Diagnosis not present

## 2017-04-19 DIAGNOSIS — S21001A Unspecified open wound of right breast, initial encounter: Secondary | ICD-10-CM

## 2017-04-19 DIAGNOSIS — E785 Hyperlipidemia, unspecified: Secondary | ICD-10-CM | POA: Insufficient documentation

## 2017-04-19 DIAGNOSIS — Z9221 Personal history of antineoplastic chemotherapy: Secondary | ICD-10-CM | POA: Insufficient documentation

## 2017-04-19 DIAGNOSIS — Z9011 Acquired absence of right breast and nipple: Secondary | ICD-10-CM | POA: Insufficient documentation

## 2017-04-19 DIAGNOSIS — Z79899 Other long term (current) drug therapy: Secondary | ICD-10-CM | POA: Insufficient documentation

## 2017-04-19 DIAGNOSIS — Y838 Other surgical procedures as the cause of abnormal reaction of the patient, or of later complication, without mention of misadventure at the time of the procedure: Secondary | ICD-10-CM | POA: Diagnosis not present

## 2017-04-19 DIAGNOSIS — T8131XA Disruption of external operation (surgical) wound, not elsewhere classified, initial encounter: Secondary | ICD-10-CM | POA: Diagnosis present

## 2017-04-19 HISTORY — PX: INCISION AND DRAINAGE OF WOUND: SHX1803

## 2017-04-19 SURGERY — IRRIGATION AND DEBRIDEMENT WOUND
Anesthesia: General | Site: Breast | Laterality: Right

## 2017-04-19 MED ORDER — PROPOFOL 10 MG/ML IV BOLUS
INTRAVENOUS | Status: DC | PRN
  Administered 2017-04-19: 200 mg via INTRAVENOUS

## 2017-04-19 MED ORDER — ACETAMINOPHEN 325 MG PO TABS: 650.0000 mg | ORAL_TABLET | ORAL | Status: DC | PRN

## 2017-04-19 MED ORDER — OXYCODONE HCL 5 MG PO TABS: 5.0000 mg | ORAL_TABLET | Freq: Once | ORAL | Status: DC | PRN

## 2017-04-19 MED ORDER — EPHEDRINE SULFATE 50 MG/ML IJ SOLN
INTRAMUSCULAR | Status: AC
Start: 2017-04-19 — End: 2017-04-19
  Filled 2017-04-19: qty 1

## 2017-04-19 MED ORDER — MEPERIDINE HCL 25 MG/ML IJ SOLN: 6.2500 mg | INTRAMUSCULAR | Status: DC | PRN

## 2017-04-19 MED ORDER — SODIUM CHLORIDE 0.9 % IV SOLN: 250.0000 mL | INTRAVENOUS | Status: DC | PRN

## 2017-04-19 MED ORDER — HYDROMORPHONE HCL 1 MG/ML IJ SOLN
0.2500 mg | INTRAMUSCULAR | Status: DC | PRN
  Administered 2017-04-19 (×2): 0.5 mg via INTRAVENOUS

## 2017-04-19 MED ORDER — ONDANSETRON HCL 4 MG/2ML IJ SOLN
INTRAMUSCULAR | Status: DC | PRN
  Administered 2017-04-19: 4 mg via INTRAVENOUS

## 2017-04-19 MED ORDER — PROPOFOL 10 MG/ML IV BOLUS
INTRAVENOUS | Status: AC
  Filled 2017-04-19: qty 20

## 2017-04-19 MED ORDER — HYDROMORPHONE HCL 1 MG/ML IJ SOLN
INTRAMUSCULAR | Status: AC
  Filled 2017-04-19: qty 0.5

## 2017-04-19 MED ORDER — MIDAZOLAM HCL 5 MG/5ML IJ SOLN
INTRAMUSCULAR | Status: DC | PRN
  Administered 2017-04-19: 2 mg via INTRAVENOUS

## 2017-04-19 MED ORDER — DEXAMETHASONE SODIUM PHOSPHATE 10 MG/ML IJ SOLN
INTRAMUSCULAR | Status: DC | PRN
  Administered 2017-04-19: 10 mg via INTRAVENOUS

## 2017-04-19 MED ORDER — SCOPOLAMINE 1 MG/3DAYS TD PT72
1.0000 | MEDICATED_PATCH | Freq: Once | TRANSDERMAL | Status: DC | PRN
  Administered 2017-04-19: 1.5 mg via TRANSDERMAL

## 2017-04-19 MED ORDER — OXYCODONE HCL 5 MG/5ML PO SOLN: 5.0000 mg | Freq: Once | ORAL | Status: DC | PRN

## 2017-04-19 MED ORDER — EPHEDRINE SULFATE 50 MG/ML IJ SOLN
INTRAMUSCULAR | Status: DC | PRN
  Administered 2017-04-19 (×3): 10 mg via INTRAVENOUS

## 2017-04-19 MED ORDER — SCOPOLAMINE 1 MG/3DAYS TD PT72
MEDICATED_PATCH | TRANSDERMAL | Status: AC
  Filled 2017-04-19: qty 1

## 2017-04-19 MED ORDER — FENTANYL CITRATE (PF) 100 MCG/2ML IJ SOLN
INTRAMUSCULAR | Status: DC | PRN
  Administered 2017-04-19 (×4): 25 ug via INTRAVENOUS

## 2017-04-19 MED ORDER — ONDANSETRON HCL 4 MG/2ML IJ SOLN
INTRAMUSCULAR | Status: AC
  Filled 2017-04-19: qty 2

## 2017-04-19 MED ORDER — MIDAZOLAM HCL 2 MG/2ML IJ SOLN
INTRAMUSCULAR | Status: AC
  Filled 2017-04-19: qty 2

## 2017-04-19 MED ORDER — PHENYLEPHRINE 40 MCG/ML (10ML) SYRINGE FOR IV PUSH (FOR BLOOD PRESSURE SUPPORT)
PREFILLED_SYRINGE | INTRAVENOUS | Status: DC | PRN
  Administered 2017-04-19 (×5): 80 ug via INTRAVENOUS

## 2017-04-19 MED ORDER — OXYCODONE HCL 5 MG PO TABS: 5.0000 mg | ORAL_TABLET | ORAL | Status: DC | PRN

## 2017-04-19 MED ORDER — LIDOCAINE 2% (20 MG/ML) 5 ML SYRINGE
INTRAMUSCULAR | Status: DC | PRN
  Administered 2017-04-19: 60 mg via INTRAVENOUS

## 2017-04-19 MED ORDER — CEFAZOLIN SODIUM-DEXTROSE 2-4 GM/100ML-% IV SOLN: 2.0000 g | INTRAVENOUS | Status: DC

## 2017-04-19 MED ORDER — SODIUM CHLORIDE 0.9% FLUSH: 3.0000 mL | INTRAVENOUS | Status: DC | PRN

## 2017-04-19 MED ORDER — MIDAZOLAM HCL 2 MG/2ML IJ SOLN: 1.0000 mg | INTRAMUSCULAR | Status: DC | PRN

## 2017-04-19 MED ORDER — PROMETHAZINE HCL 25 MG/ML IJ SOLN: 6.2500 mg | INTRAMUSCULAR | Status: DC | PRN

## 2017-04-19 MED ORDER — SODIUM CHLORIDE 0.9 % IV SOLN
INTRAVENOUS | Status: DC | PRN
  Administered 2017-04-19: 1000 mL

## 2017-04-19 MED ORDER — CIPROFLOXACIN IN D5W 400 MG/200ML IV SOLN
400.0000 mg | INTRAVENOUS | Status: AC
  Administered 2017-04-19: 400 mg via INTRAVENOUS

## 2017-04-19 MED ORDER — PHENYLEPHRINE 40 MCG/ML (10ML) SYRINGE FOR IV PUSH (FOR BLOOD PRESSURE SUPPORT)
PREFILLED_SYRINGE | INTRAVENOUS | Status: AC
  Filled 2017-04-19: qty 10

## 2017-04-19 MED ORDER — SODIUM CHLORIDE 0.9% FLUSH: 3.0000 mL | Freq: Two times a day (BID) | INTRAVENOUS | Status: DC

## 2017-04-19 MED ORDER — FENTANYL CITRATE (PF) 100 MCG/2ML IJ SOLN: 50.0000 ug | INTRAMUSCULAR | Status: DC | PRN

## 2017-04-19 MED ORDER — LACTATED RINGERS IV SOLN
INTRAVENOUS | Status: DC
  Administered 2017-04-19 (×2): via INTRAVENOUS

## 2017-04-19 MED ORDER — LIDOCAINE 2% (20 MG/ML) 5 ML SYRINGE
INTRAMUSCULAR | Status: AC
  Filled 2017-04-19: qty 5

## 2017-04-19 MED ORDER — ONDANSETRON HCL 4 MG/2ML IJ SOLN
4.0000 mg | Freq: Once | INTRAMUSCULAR | Status: AC
  Administered 2017-04-19: 4 mg via INTRAVENOUS

## 2017-04-19 MED ORDER — DEXAMETHASONE SODIUM PHOSPHATE 10 MG/ML IJ SOLN
INTRAMUSCULAR | Status: AC
  Filled 2017-04-19: qty 1

## 2017-04-19 MED ORDER — FENTANYL CITRATE (PF) 100 MCG/2ML IJ SOLN
INTRAMUSCULAR | Status: AC
  Filled 2017-04-19: qty 2

## 2017-04-19 MED ORDER — ACETAMINOPHEN 650 MG RE SUPP: 650.0000 mg | RECTAL | Status: DC | PRN

## 2017-04-19 MED ORDER — CIPROFLOXACIN IN D5W 400 MG/200ML IV SOLN
INTRAVENOUS | Status: AC
  Filled 2017-04-19: qty 200

## 2017-04-19 SURGICAL SUPPLY — 68 items
ADH SKN CLS APL DERMABOND .7 (GAUZE/BANDAGES/DRESSINGS) ×1
APL SKNCLS STERI-STRIP NONHPOA (GAUZE/BANDAGES/DRESSINGS)
BAG DECANTER FOR FLEXI CONT (MISCELLANEOUS) IMPLANT
BENZOIN TINCTURE PRP APPL 2/3 (GAUZE/BANDAGES/DRESSINGS) IMPLANT
BLADE HEX COATED 2.75 (ELECTRODE) IMPLANT
BLADE SURG 10 STRL SS (BLADE) IMPLANT
BLADE SURG 15 STRL LF DISP TIS (BLADE) ×1 IMPLANT
BLADE SURG 15 STRL SS (BLADE) ×2
CANISTER SUCT 1200ML W/VALVE (MISCELLANEOUS) ×1 IMPLANT
CHLORAPREP W/TINT 26ML (MISCELLANEOUS) IMPLANT
COVER BACK TABLE 60X90IN (DRAPES) ×2 IMPLANT
COVER MAYO STAND STRL (DRAPES) ×2 IMPLANT
DECANTER SPIKE VIAL GLASS SM (MISCELLANEOUS) IMPLANT
DERMABOND ADVANCED (GAUZE/BANDAGES/DRESSINGS) ×1
DERMABOND ADVANCED .7 DNX12 (GAUZE/BANDAGES/DRESSINGS) IMPLANT
DRAIN CHANNEL 19F RND (DRAIN) ×1 IMPLANT
DRAIN PENROSE 1/2X12 LTX STRL (WOUND CARE) IMPLANT
DRAPE INCISE IOBAN 66X45 STRL (DRAPES) IMPLANT
DRAPE LAPAROSCOPIC ABDOMINAL (DRAPES) IMPLANT
DRAPE LAPAROTOMY 100X72 PEDS (DRAPES) IMPLANT
DRSG ADAPTIC 3X8 NADH LF (GAUZE/BANDAGES/DRESSINGS) IMPLANT
DRSG EMULSION OIL 3X3 NADH (GAUZE/BANDAGES/DRESSINGS) IMPLANT
DRSG PAD ABDOMINAL 8X10 ST (GAUZE/BANDAGES/DRESSINGS) ×1 IMPLANT
ELECT REM PT RETURN 9FT ADLT (ELECTROSURGICAL) ×2
ELECTRODE REM PT RTRN 9FT ADLT (ELECTROSURGICAL) ×1 IMPLANT
EVACUATOR SILICONE 100CC (DRAIN) ×1 IMPLANT
GAUZE SPONGE 4X4 12PLY STRL (GAUZE/BANDAGES/DRESSINGS) ×2 IMPLANT
GAUZE SPONGE 4X4 12PLY STRL LF (GAUZE/BANDAGES/DRESSINGS) IMPLANT
GAUZE XEROFORM 1X8 LF (GAUZE/BANDAGES/DRESSINGS) IMPLANT
GAUZE XEROFORM 5X9 LF (GAUZE/BANDAGES/DRESSINGS) IMPLANT
GLOVE BIO SURGEON STRL SZ 6.5 (GLOVE) ×4 IMPLANT
GOWN STRL REUS W/ TWL LRG LVL3 (GOWN DISPOSABLE) ×2 IMPLANT
GOWN STRL REUS W/TWL LRG LVL3 (GOWN DISPOSABLE) ×4
IV NS IRRIG 3000ML ARTHROMATIC (IV SOLUTION) IMPLANT
MANIFOLD NEPTUNE II (INSTRUMENTS) IMPLANT
NDL HYPO 25X1 1.5 SAFETY (NEEDLE) IMPLANT
NEEDLE HYPO 25X1 1.5 SAFETY (NEEDLE) IMPLANT
NS IRRIG 1000ML POUR BTL (IV SOLUTION) ×2 IMPLANT
PACK BASIN DAY SURGERY FS (CUSTOM PROCEDURE TRAY) ×2 IMPLANT
PENCIL BUTTON HOLSTER BLD 10FT (ELECTRODE) ×2 IMPLANT
PIN SAFETY STERILE (MISCELLANEOUS) ×1 IMPLANT
SHEET MEDIUM DRAPE 40X70 STRL (DRAPES) IMPLANT
SLEEVE SCD COMPRESS KNEE MED (MISCELLANEOUS) ×1 IMPLANT
SPONGE LAP 18X18 X RAY DECT (DISPOSABLE) ×1 IMPLANT
STAPLER VISISTAT 35W (STAPLE) IMPLANT
STRIP CLOSURE SKIN 1/2X4 (GAUZE/BANDAGES/DRESSINGS) IMPLANT
SUCTION FRAZIER HANDLE 10FR (MISCELLANEOUS)
SUCTION TUBE FRAZIER 10FR DISP (MISCELLANEOUS) IMPLANT
SURGILUBE 2OZ TUBE FLIPTOP (MISCELLANEOUS) IMPLANT
SUT MNCRL AB 4-0 PS2 18 (SUTURE) ×2 IMPLANT
SUT MON AB 3-0 SH 27 (SUTURE)
SUT MON AB 3-0 SH27 (SUTURE) IMPLANT
SUT PDS 3-0 CT2 (SUTURE) ×2
SUT PDS II 3-0 CT2 27 ABS (SUTURE) IMPLANT
SUT SILK 3 0 PS 1 (SUTURE) ×1 IMPLANT
SUT VIC AB 3-0 FS2 27 (SUTURE) IMPLANT
SUT VIC AB 5-0 PS2 18 (SUTURE) IMPLANT
SUT VICRYL 4-0 PS2 18IN ABS (SUTURE) IMPLANT
SWAB COLLECTION DEVICE MRSA (MISCELLANEOUS) ×1 IMPLANT
SWAB CULTURE ESWAB REG 1ML (MISCELLANEOUS) ×1 IMPLANT
SYR BULB IRRIGATION 50ML (SYRINGE) IMPLANT
SYR CONTROL 10ML LL (SYRINGE) IMPLANT
TAPE HYPAFIX 6X30 (GAUZE/BANDAGES/DRESSINGS) IMPLANT
TOWEL OR 17X24 6PK STRL BLUE (TOWEL DISPOSABLE) ×2 IMPLANT
TRAY DSU PREP LF (CUSTOM PROCEDURE TRAY) IMPLANT
TUBE CONNECTING 20X1/4 (TUBING) ×1 IMPLANT
UNDERPAD 30X30 (UNDERPADS AND DIAPERS) ×1 IMPLANT
YANKAUER SUCT BULB TIP NO VENT (SUCTIONS) ×1 IMPLANT

## 2017-04-19 NOTE — Anesthesia Postprocedure Evaluation (Signed)
Anesthesia Post Note  Patient: Melanie Moss  Procedure(s) Performed: IRRIGATION OF RIGHT BREAST POCKET (Right Breast)     Patient location during evaluation: PACU Anesthesia Type: General Level of consciousness: awake and alert Pain management: pain level controlled Vital Signs Assessment: post-procedure vital signs reviewed and stable Respiratory status: spontaneous breathing, nonlabored ventilation and respiratory function stable Cardiovascular status: blood pressure returned to baseline and stable Postop Assessment: no apparent nausea or vomiting Anesthetic complications: no    Last Vitals:  Vitals:   04/19/17 1415 04/19/17 1430  BP: 121/72   Pulse: 87 86  Resp: 20 20  Temp:    SpO2: 98% 100%    Last Pain:  Vitals:   04/19/17 1355  TempSrc:   PainSc: [place]

## 2017-04-19 NOTE — Discharge Instructions (Addendum)
May shower tomorrow. Continue binder or sports bra.    Post Anesthesia Home Care Instructions  Activity: Get plenty of rest for the remainder of the day. A responsible individual must stay with you for 24 hours following the procedure.  For the next 24 hours, DO NOT: -Drive a car -Paediatric nurse -Drink alcoholic beverages -Take any medication unless instructed by your physician -Make any legal decisions or sign important papers.  Meals: Start with liquid foods such as gelatin or soup. Progress to regular foods as tolerated. Avoid greasy, spicy, heavy foods. If nausea and/or vomiting occur, drink only clear liquids until the nausea and/or vomiting subsides. Call your physician if vomiting continues.  Special Instructions/Symptoms: Your throat may feel dry or sore from the anesthesia or the breathing tube placed in your throat during surgery. If this causes discomfort, gargle with warm salt water. The discomfort should disappear within 24 hours.  If you had a scopolamine patch placed behind your ear for the management of post- operative nausea and/or vomiting:  1. The medication in the patch is effective for 72 hours, after which it should be removed.  Wrap patch in a tissue and discard in the trash. Wash hands thoroughly with soap and water. 2. You may remove the patch earlier than 72 hours if you experience unpleasant side effects which may include dry mouth, dizziness or visual disturbances. 3. Avoid touching the patch. Wash your hands with soap and water after contact with the patch.  About my Jackson-Pratt Bulb Drain  What is a Jackson-Pratt bulb? A Jackson-Pratt is a soft, round device used to collect drainage. It is connected to a long, thin drainage catheter, which is held in place by one or two small stiches near your surgical incision site. When the bulb is squeezed, it forms a vacuum, forcing the drainage to empty into the bulb.  Emptying the Jackson-Pratt bulb- To empty  the bulb: 1. Release the plug on the top of the bulb. 2. Pour the bulb's contents into a measuring container which your nurse will provide. 3. Record the time emptied and amount of drainage. Empty the drain(s) as often as your     doctor or nurse recommends.  Date                  Time                    Amount (Drain 1)                 Amount (Drain 2)  _____________________________________________________________________  _____________________________________________________________________  _____________________________________________________________________  _____________________________________________________________________  _____________________________________________________________________  _____________________________________________________________________  _____________________________________________________________________  _____________________________________________________________________  Squeezing the Jackson-Pratt Bulb- To squeeze the bulb: 1. Make sure the plug at the top of the bulb is open. 2. Squeeze the bulb tightly in your fist. You will hear air squeezing from the bulb. 3. Replace the plug while the bulb is squeezed. 4. Use a safety pin to attach the bulb to your clothing. This will keep the catheter from     pulling at the bulb insertion site.  When to call your doctor- Call your doctor if:  Drain site becomes red, swollen or hot.  You have a fever greater than 101 degrees F.  There is oozing at the drain site.  Drain falls out (apply a guaze bandage over the drain hole and secure it with tape).  Drainage increases daily not related to activity patterns. (You will usually have more drainage when you are active  than when you are resting.)  Drainage has a bad odor.

## 2017-04-19 NOTE — Anesthesia Procedure Notes (Signed)
Procedure Name: LMA Insertion Date/Time: 04/19/2017 11:48 AM Performed by: Genelle Bal, CRNA Pre-anesthesia Checklist: Patient identified, Emergency Drugs available, Suction available and Patient being monitored Patient Re-evaluated:Patient Re-evaluated prior to induction Oxygen Delivery Method: Circle system utilized Preoxygenation: Pre-oxygenation with 100% oxygen Induction Type: IV induction Ventilation: Mask ventilation without difficulty LMA: LMA inserted LMA Size: 3.0 Number of attempts: 1 Airway Equipment and Method: Bite block Placement Confirmation: positive ETCO2 Tube secured with: Tape Dental Injury: Teeth and Oropharynx as per pre-operative assessment

## 2017-04-19 NOTE — Op Note (Signed)
DATE OF OPERATION: 04/19/2017  LOCATION: [facility]  PREOPERATIVE DIAGNOSIS: right breast wound  POSTOPERATIVE DIAGNOSIS: Same  PROCEDURE: Excision of right breast wound 1 x 3 cm with primary closure.  Wash out of right breast pocket with drain placement.  SURGEON: Nilaya Bouie Sanger Azara Gemme, DO  EBL: 5 cc  CONDITION: Stable  COMPLICATIONS: None  INDICATION: The patient, Melanie Moss, is a 41 y.o. female born on June 19, 1975, is here for treatment of a right breast wound.   PROCEDURE DETAILS:  The patient was seen prior to surgery and marked.  The IV antibiotics were given. The patient was taken to the operating room and given a general anesthetic. A standard time out was performed and all information was confirmed by those in the room. SCDs were placed.   The right breast was prepped with betadine and draped in the usual sterile fashion.  The #10 blade was used to make an incision at the previous mastectomy incision site.  The bovie was used to dissect to the fascia / muscle layer and release the inferior and superior flaps.  The muscle / flex HD junction was open.  The silicone implant was removed.  Cultures were obtained from the deep breast pocket.  The pocket was then irrigated with antibiotic solution.  There was no purulence noted.  The #15 blade was used to make a stab incision and a drain was placed and secured to the skin with 3-0 Silk.  The implant was then placed back in the pocket.  The muscle / flexHD was closed with the 3-0 PDS.  The right lateral superior wound was irrigated and the edges excised of the 1 x 3 cm wound.  A layered closure was performed with the 3-0 and 4-0 Monocryl.  Dermabond was applied to both incision sites.  abd and gauze was applied with a breast binder.  The patient was allowed to wake up and taken to recovery room in stable condition at the end of the case. The family was notified at the end of the case.

## 2017-04-19 NOTE — Anesthesia Preprocedure Evaluation (Signed)
Anesthesia Evaluation  Patient identified by MRN, date of birth, ID band Patient awake    Reviewed: Allergy & Precautions, NPO status , Patient's Chart, lab work & pertinent test results  Airway Mallampati: II  TM Distance: >3 FB Neck ROM: Full    Dental  (+) Teeth Intact, Dental Advisory Given   Pulmonary neg pulmonary ROS,    Pulmonary exam normal breath sounds clear to auscultation       Cardiovascular hypertension, Pt. on medications Normal cardiovascular exam Rhythm:Regular Rate:Normal     Neuro/Psych PSYCHIATRIC DISORDERS Anxiety negative neurological ROS     GI/Hepatic Neg liver ROS, GERD  ,  Endo/Other  negative endocrine ROS  Renal/GU negative Renal ROS     Musculoskeletal negative musculoskeletal ROS (+)   Abdominal   Peds  Hematology negative hematology ROS (+)   Anesthesia Other Findings Day of surgery medications reviewed with the patient.  Reproductive/Obstetrics                             Anesthesia Physical  Anesthesia Plan  ASA: II  Anesthesia Plan: General   Post-op Pain Management:    Induction: Intravenous  PONV Risk Score and Plan: 3 and Ondansetron, Dexamethasone and Midazolam  Airway Management Planned: LMA  Additional Equipment:   Intra-op Plan:   Post-operative Plan: Extubation in OR  Informed Consent: I have reviewed the patients History and Physical, chart, labs and discussed the procedure including the risks, benefits and alternatives for the proposed anesthesia with the patient or authorized representative who has indicated his/her understanding and acceptance.   Dental advisory given  Plan Discussed with: CRNA  Anesthesia Plan Comments: (Risks/benefits of general anesthesia discussed with patient including risk of damage to teeth, lips, gum, and tongue, nausea/vomiting, allergic reactions to medications, and the possibility of heart attack,  stroke and death.  All patient questions answered.  Patient wishes to proceed.)        Anesthesia Quick Evaluation

## 2017-04-19 NOTE — H&P (Signed)
Melanie Moss is an 41 y.o. female.   Chief Complaint: right breast wound HPI: The patient is a 41 yrs old bf here for treatment of a right breast wound.  She underwent mastectomies and exchange after expander placement.  She had an area on the right breast that was stuck down laterally and this was released.  Unfortunately the skin opened.  She presented with pain, swelling and some redness.    Past Medical History:  Diagnosis Date  . Anemia   . Anxiety   . Breast cancer of lower-outer quadrant of right female breast ([place]) 07/29/2015  . Family history of breast cancer   . Family history of colon cancer   . Family history of prostate cancer   . GERD (gastroesophageal reflux disease)    tums   . History of radiation therapy 03/07/16-04/27/16   right chest wall 45 Gy in 25 fractions, right mastectomy scar 16 Gy in 8 fractions  . Hyperlipidemia   . Hypertension    not currently on medication  . Personal history of chemotherapy   . Personal history of radiation therapy   . Preterm labor     Past Surgical History:  Procedure Laterality Date  . AXILLARY LYMPH NODE DISSECTION Right 09/29/2015   Procedure: AXILLARY LYMPH NODE DISSECTION;  Surgeon: Excell Seltzer, MD;  Location: [facility];  Service: General;  Laterality: Right;  . BREAST RECONSTRUCTION WITH PLACEMENT OF TISSUE EXPANDER AND FLEX HD (ACELLULAR HYDRATED DERMIS) Right 09/29/2015   Procedure: BREAST RECONSTRUCTION WITH PLACEMENT OF TISSUE EXPANDER AND FLEX HD (ACELLULAR HYDRATED DERMIS);  Surgeon: Loel Lofty Dillingham, DO;  Location: [facility];  Service: Plastics;  Laterality: Right;  . CERVICAL CERCLAGE  02/19/2011   Procedure: CERCLAGE CERVICAL;  Surgeon: Agnes Lawrence, MD;  Location: [facility];  Service: Gynecology;  Laterality: N/A;  . DILATION AND CURETTAGE OF UTERUS    . MASTECTOMY     right 2017  . MASTECTOMY WITH AXILLARY LYMPH NODE DISSECTION Right 09/29/2015  . MASTOPEXY Left 03/11/2017   Procedure: MASTOPEXY FOR SYMENTRY;   Surgeon: Wallace Going, DO;  Location: [facility];  Service: Plastics;  Laterality: Left;  . PORTACATH PLACEMENT N/A 09/29/2015   Procedure: INSERTION PORT-A-CATH;  Surgeon: Excell Seltzer, MD;  Location: [facility];  Service: General;  Laterality: N/A;  . REMOVAL OF BILATERAL TISSUE EXPANDERS WITH PLACEMENT OF BILATERAL BREAST IMPLANTS Right 03/11/2017   Procedure: REMOVAL OF RIGHT TISSUE EXPANDERS WITH PLACEMRIGHT SILICONE  BREAST IMPLANTS;  Surgeon: Wallace Going, DO;  Location: [facility];  Service: Plastics;  Laterality: Right;  . SIMPLE MASTECTOMY WITH AXILLARY SENTINEL NODE BIOPSY Right 09/29/2015   Procedure: RIGHT TOTAL  MASTECTOMY WITH AXILLARY SENTINEL NODE BIOPSY;  Surgeon: Excell Seltzer, MD;  Location: [facility];  Service: General;  Laterality: Right;    Family History  Problem Relation Age of Onset  . Hypertension Mother   . Hypertension Father   . Prostate cancer Father 61  . Breast cancer Maternal Aunt 7  . Breast cancer Paternal Aunt 35  . Cirrhosis Maternal Uncle   . Prostate cancer Paternal Uncle   . Throat cancer Maternal Aunt        non smoker  . Prostate cancer Paternal Uncle   . Colon cancer Cousin        maternal first cousin dx in his 24s  . Lung cancer Cousin        smoker   Social History:  reports that  has never  smoked. she has never used smokeless tobacco. She reports that she does not drink alcohol or use drugs.  Allergies: No Known Allergies  Medications Prior to Admission  Medication Sig Dispense Refill  . B Complex-C (SUPER B COMPLEX PO) Take 1 tablet by mouth daily.    Water engineer Bandages & Supports (KNEE COMPRESSION SLEEVE/L/XL) MISC   0  . ferrous sulfate 325 (65 FE) MG tablet Take 325 mg by mouth 2 (two) times daily with a meal.    . irbesartan-hydrochlorothiazide (AVALIDE) 150-12.5 MG tablet Take 1 tablet daily by mouth.    . Multiple Vitamin (MULTIVITAMIN) tablet Take 1 tablet by mouth daily.    .  Neratinib Maleate (NERLYNX) 40 MG tablet Take 6 tablets (240 mg total) by mouth daily. Take with food. 180 tablet 3  . ondansetron (ZOFRAN-ODT) 4 MG disintegrating tablet Take 4 mg by mouth every 8 (eight) hours as needed for nausea or vomiting. Reported on 11/01/2015  0  . tamoxifen (NOLVADEX) 20 MG tablet Take 1 tablet (20 mg total) by mouth daily. 90 tablet 3  . clonazePAM (KLONOPIN) 0.5 MG tablet Take 0.5 mg by mouth 2 (two) times daily.  0  . diazepam (VALIUM) 2 MG tablet Take 2 mg every 6 (six) hours as needed by mouth for anxiety.    . diphenoxylate-atropine (LOMOTIL) 2.5-0.025 MG tablet TAKE 1 TO 2 TABLETS BY MOUTH 4 TIMES DAILY AS NEEDED FOR DIARRHEA OR LOOSE STOOLS (Patient not taking: Reported on 04/04/2017) 30 tablet 1  . hydrochlorothiazide (HYDRODIURIL) 25 MG tablet Take 1 tablet (25 mg total) by mouth daily. 30 tablet 6  . mirtazapine (REMERON) 15 MG tablet Take 1 tablet (15 mg total) by mouth at bedtime. 30 tablet 2    No results found for this or any previous visit (from the past 48 hour(s)). No results found.  Review of Systems  Constitutional: Positive for chills.  HENT: Negative.   Eyes: Negative.   Respiratory: Negative.   Cardiovascular: Negative.   Gastrointestinal: Negative.   Genitourinary: Negative.   Musculoskeletal: Negative.   Neurological: Negative.   Psychiatric/Behavioral: Negative.     There were no vitals taken for this visit. Physical Exam  Constitutional: She is oriented to person, place, and time. She appears well-developed and well-nourished.  HENT:  Head: Normocephalic and atraumatic.  Eyes: EOM are normal. Pupils are equal, round, and reactive to light.  Cardiovascular: Normal rate.  Respiratory: Effort normal. No respiratory distress.    GI: Soft. She exhibits no distension.  Neurological: She is alert and oriented to person, place, and time.  Skin: There is erythema.  Psychiatric: She has a normal mood and affect. Her behavior is normal.  Judgment and thought content normal.     Assessment/Plan Plan for excision of breast wound and possible removal and washout of right breast pocket.  Possible removal of implant.  [place], DO 04/19/2017, 11:19 AM

## 2017-04-19 NOTE — Transfer of Care (Signed)
Immediate Anesthesia Transfer of Care Note  Patient: MEKALA WINGER  Procedure(s) Performed: IRRIGATION OF RIGHT BREAST POCKET (Right Breast)  Patient Location: PACU  Anesthesia Type:General  Level of Consciousness: awake, alert  and oriented  Airway & Oxygen Therapy: Patient Spontanous Breathing and Patient connected to face mask oxygen  Post-op Assessment: Report given to RN and Post -op Vital signs reviewed and stable  Post vital signs: Reviewed and stable  Last Vitals:  Vitals:   04/19/17 1124  BP: 110/68  Pulse: 94  Resp: 16  Temp: 36.6 C  SpO2: 100%    Last Pain:  Vitals:   04/19/17 1124  TempSrc: [place]  PainSc: 6       Patients Stated Pain Goal: 3 (04/15/48 7530)  Complications: No apparent anesthesia complications

## 2017-04-21 ENCOUNTER — Encounter (HOSPITAL_BASED_OUTPATIENT_CLINIC_OR_DEPARTMENT_OTHER): Payer: Self-pay | Admitting: Plastic Surgery

## 2017-04-24 LAB — AEROBIC/ANAEROBIC CULTURE W GRAM STAIN (SURGICAL/DEEP WOUND)

## 2017-05-31 ENCOUNTER — Other Ambulatory Visit: Payer: Self-pay

## 2017-05-31 ENCOUNTER — Ambulatory Visit: Payer: Self-pay | Admitting: Plastic Surgery

## 2017-05-31 ENCOUNTER — Encounter (HOSPITAL_COMMUNITY): Payer: Self-pay | Admitting: *Deleted

## 2017-05-31 DIAGNOSIS — Z9011 Acquired absence of right breast and nipple: Secondary | ICD-10-CM

## 2017-05-31 NOTE — Progress Notes (Signed)
Pt denies SOB, chest pain, and being under the care of a cardiologist. Pt denies having a stress test and cardiac cath. Pt denies having an EKG and chest x ray within the last year. Pt denies recent labs. Pt made aware to stop taking  Aspirin, vitamins, fish oil and herbal medications. Do not take any NSAIDs ie: Ibuprofen, Advil, Naproxen (Aleve), Motrin, BC and Goody Powder. Pt verbalized understanding of all pre-op instructions.

## 2017-06-01 ENCOUNTER — Encounter (HOSPITAL_COMMUNITY): Payer: Self-pay | Admitting: *Deleted

## 2017-06-01 ENCOUNTER — Ambulatory Visit (HOSPITAL_COMMUNITY)
Admission: RE | Admit: 2017-06-01 | Discharge: 2017-06-01 | Disposition: A | Discharge status: Home / Self Care | Payer: BLUE CROSS/BLUE SHIELD | Source: Ambulatory Visit | Attending: Plastic Surgery | Admitting: Plastic Surgery

## 2017-06-01 ENCOUNTER — Ambulatory Visit (HOSPITAL_COMMUNITY): Payer: BLUE CROSS/BLUE SHIELD | Admitting: Certified Registered Nurse Anesthetist

## 2017-06-01 ENCOUNTER — Encounter (HOSPITAL_COMMUNITY)
Admission: RE | Disposition: A | Discharge status: Home / Self Care | Payer: Self-pay | Source: Ambulatory Visit | Attending: Plastic Surgery

## 2017-06-01 DIAGNOSIS — Z79899 Other long term (current) drug therapy: Secondary | ICD-10-CM | POA: Diagnosis not present

## 2017-06-01 DIAGNOSIS — Z853 Personal history of malignant neoplasm of breast: Secondary | ICD-10-CM | POA: Diagnosis not present

## 2017-06-01 DIAGNOSIS — I10 Essential (primary) hypertension: Secondary | ICD-10-CM | POA: Insufficient documentation

## 2017-06-01 DIAGNOSIS — Z79810 Long term (current) use of selective estrogen receptor modulators (SERMs): Secondary | ICD-10-CM | POA: Diagnosis not present

## 2017-06-01 DIAGNOSIS — Z9221 Personal history of antineoplastic chemotherapy: Secondary | ICD-10-CM | POA: Diagnosis not present

## 2017-06-01 DIAGNOSIS — T8549XA Other mechanical complication of breast prosthesis and implant, initial encounter: Secondary | ICD-10-CM | POA: Insufficient documentation

## 2017-06-01 DIAGNOSIS — Z9011 Acquired absence of right breast and nipple: Secondary | ICD-10-CM | POA: Diagnosis not present

## 2017-06-01 DIAGNOSIS — Z803 Family history of malignant neoplasm of breast: Secondary | ICD-10-CM | POA: Insufficient documentation

## 2017-06-01 DIAGNOSIS — Y831 Surgical operation with implant of artificial internal device as the cause of abnormal reaction of the patient, or of later complication, without mention of misadventure at the time of the procedure: Secondary | ICD-10-CM | POA: Diagnosis not present

## 2017-06-01 DIAGNOSIS — L905 Scar conditions and fibrosis of skin: Secondary | ICD-10-CM | POA: Diagnosis not present

## 2017-06-01 DIAGNOSIS — Z923 Personal history of irradiation: Secondary | ICD-10-CM | POA: Diagnosis not present

## 2017-06-01 DIAGNOSIS — D649 Anemia, unspecified: Secondary | ICD-10-CM | POA: Diagnosis not present

## 2017-06-01 HISTORY — PX: BREAST IMPLANT REMOVAL: SHX5361

## 2017-06-01 LAB — CBC
HCT: 30.2 % — ABNORMAL LOW (ref 36.0–46.0)
Hemoglobin: 9.6 g/dL — ABNORMAL LOW (ref 12.0–15.0)
MCH: 30.7 pg (ref 26.0–34.0)
MCHC: 31.8 g/dL (ref 30.0–36.0)
MCV: 96.5 fL (ref 78.0–100.0)
Platelets: 315 10*3/uL (ref 150–400)
RBC: 3.13 MIL/uL — ABNORMAL LOW (ref 3.87–5.11)
RDW: 13.7 % (ref 11.5–15.5)
WBC: 6.6 10*3/uL (ref 4.0–10.5)

## 2017-06-01 LAB — BASIC METABOLIC PANEL
Anion gap: 12 (ref 5–15)
BUN: 7 mg/dL (ref 6–20)
CO2: 22 mmol/L (ref 22–32)
Calcium: 9 mg/dL (ref 8.9–10.3)
Chloride: 106 mmol/L (ref 101–111)
Creatinine, Ser: 0.98 mg/dL (ref 0.44–1.00)
GFR calc Af Amer: >60 mL/min (ref >60)
GFR calc non Af Amer: >60 mL/min (ref >60)
Glucose, Bld: 103 mg/dL — ABNORMAL HIGH (ref 65–99)
Potassium: 3.5 mmol/L (ref 3.5–5.1)
Sodium: 140 mmol/L (ref 135–145)

## 2017-06-01 LAB — POCT PREGNANCY, URINE: Preg Test, Ur: NEGATIVE

## 2017-06-01 SURGERY — REMOVAL, IMPLANT, BREAST
Anesthesia: General | Site: Chest | Laterality: Right

## 2017-06-01 MED ORDER — ONDANSETRON HCL 4 MG PO TABS: 4.0000 mg | ORAL_TABLET | Freq: Every day | ORAL | 1 refills | Status: DC | PRN

## 2017-06-01 MED ORDER — LIDOCAINE HCL (CARDIAC) 20 MG/ML IV SOLN
INTRAVENOUS | Status: DC | PRN
  Administered 2017-06-01: 60 mg via INTRAVENOUS

## 2017-06-01 MED ORDER — FENTANYL CITRATE (PF) 100 MCG/2ML IJ SOLN
INTRAMUSCULAR | Status: AC
  Administered 2017-06-01: 50 ug via INTRAVENOUS
  Filled 2017-06-01: qty 2

## 2017-06-01 MED ORDER — MIDAZOLAM HCL 5 MG/5ML IJ SOLN
INTRAMUSCULAR | Status: DC | PRN
  Administered 2017-06-01: 1 mg via INTRAVENOUS

## 2017-06-01 MED ORDER — OXYCODONE HCL 5 MG PO TABS
5.0000 mg | ORAL_TABLET | Freq: Once | ORAL | Status: AC | PRN
  Administered 2017-06-01: 5 mg via ORAL

## 2017-06-01 MED ORDER — ACETAMINOPHEN 10 MG/ML IV SOLN
1000.0000 mg | Freq: Once | INTRAVENOUS | Status: AC
  Administered 2017-06-01: 1000 mg via INTRAVENOUS

## 2017-06-01 MED ORDER — CEFAZOLIN SODIUM-DEXTROSE 2-4 GM/100ML-% IV SOLN
2.0000 g | INTRAVENOUS | Status: AC
  Administered 2017-06-01: 2 g via INTRAVENOUS
  Filled 2017-06-01: qty 100

## 2017-06-01 MED ORDER — SODIUM CHLORIDE 0.9 % IV SOLN: 250.0000 mL | INTRAVENOUS | Status: DC | PRN

## 2017-06-01 MED ORDER — DEXAMETHASONE SODIUM PHOSPHATE 4 MG/ML IJ SOLN
INTRAMUSCULAR | Status: DC | PRN
  Administered 2017-06-01: 10 mg via INTRAVENOUS

## 2017-06-01 MED ORDER — OXYCODONE HCL 5 MG/5ML PO SOLN: 5.0000 mg | Freq: Once | ORAL | Status: AC | PRN

## 2017-06-01 MED ORDER — PROPOFOL 10 MG/ML IV BOLUS
INTRAVENOUS | Status: AC
  Filled 2017-06-01: qty 20

## 2017-06-01 MED ORDER — 0.9 % SODIUM CHLORIDE (POUR BTL) OPTIME
TOPICAL | Status: DC | PRN
  Administered 2017-06-01: 1000 mL

## 2017-06-01 MED ORDER — MIDAZOLAM HCL 2 MG/2ML IJ SOLN
INTRAMUSCULAR | Status: AC
  Filled 2017-06-01: qty 2

## 2017-06-01 MED ORDER — PROMETHAZINE HCL 25 MG/ML IJ SOLN
6.2500 mg | INTRAMUSCULAR | Status: DC | PRN
  Administered 2017-06-01: 12.5 mg via INTRAVENOUS

## 2017-06-01 MED ORDER — SODIUM CHLORIDE 0.9% FLUSH: 3.0000 mL | INTRAVENOUS | Status: DC | PRN

## 2017-06-01 MED ORDER — PROMETHAZINE HCL 25 MG/ML IJ SOLN
INTRAMUSCULAR | Status: AC
  Filled 2017-06-01: qty 1

## 2017-06-01 MED ORDER — SODIUM CHLORIDE 0.9 % IV SOLN
INTRAVENOUS | Status: DC | PRN
  Administered 2017-06-01: 500 mL

## 2017-06-01 MED ORDER — HYDROCODONE-ACETAMINOPHEN 5-325 MG PO TABS: 1.0000 | ORAL_TABLET | Freq: Three times a day (TID) | ORAL | 0 refills | Status: DC | PRN

## 2017-06-01 MED ORDER — ONDANSETRON HCL 4 MG/2ML IJ SOLN: 4.0000 mg | Freq: Once | INTRAMUSCULAR | Status: DC | PRN

## 2017-06-01 MED ORDER — OXYCODONE HCL 5 MG PO TABS
ORAL_TABLET | ORAL | Status: AC
  Filled 2017-06-01: qty 2

## 2017-06-01 MED ORDER — OXYCODONE HCL 5 MG PO TABS: 5.0000 mg | ORAL_TABLET | ORAL | Status: DC | PRN

## 2017-06-01 MED ORDER — SODIUM CHLORIDE 0.9% FLUSH: 3.0000 mL | Freq: Two times a day (BID) | INTRAVENOUS | Status: DC

## 2017-06-01 MED ORDER — FENTANYL CITRATE (PF) 250 MCG/5ML IJ SOLN
INTRAMUSCULAR | Status: AC
  Filled 2017-06-01: qty 5

## 2017-06-01 MED ORDER — ONDANSETRON HCL 4 MG/2ML IJ SOLN
INTRAMUSCULAR | Status: DC | PRN
  Administered 2017-06-01: 4 mg via INTRAVENOUS

## 2017-06-01 MED ORDER — FENTANYL CITRATE (PF) 100 MCG/2ML IJ SOLN
INTRAMUSCULAR | Status: DC | PRN
  Administered 2017-06-01 (×2): 50 ug via INTRAVENOUS
  Administered 2017-06-01: 100 ug via INTRAVENOUS
  Administered 2017-06-01 (×2): 25 ug via INTRAVENOUS

## 2017-06-01 MED ORDER — LACTATED RINGERS IV SOLN
INTRAVENOUS | Status: DC
  Administered 2017-06-01 (×2): via INTRAVENOUS

## 2017-06-01 MED ORDER — ACETAMINOPHEN 10 MG/ML IV SOLN
INTRAVENOUS | Status: AC
  Administered 2017-06-01: 1000 mg via INTRAVENOUS
  Filled 2017-06-01: qty 100

## 2017-06-01 MED ORDER — PROPOFOL 10 MG/ML IV BOLUS
INTRAVENOUS | Status: DC | PRN
  Administered 2017-06-01: 160 mg via INTRAVENOUS

## 2017-06-01 MED ORDER — FENTANYL CITRATE (PF) 100 MCG/2ML IJ SOLN
25.0000 ug | INTRAMUSCULAR | Status: DC | PRN
  Administered 2017-06-01 (×3): 50 ug via INTRAVENOUS

## 2017-06-01 SURGICAL SUPPLY — 39 items
ADH SKN CLS APL DERMABOND .7 (GAUZE/BANDAGES/DRESSINGS) ×1
BANDAGE ACE 6X5 VEL STRL LF (GAUZE/BANDAGES/DRESSINGS) IMPLANT
BINDER BREAST MEDIUM (GAUZE/BANDAGES/DRESSINGS) ×1 IMPLANT
BIOPATCH RED 1 DISK 7.0 (GAUZE/BANDAGES/DRESSINGS) ×4 IMPLANT
BLADE 10 SAFETY STRL DISP (BLADE) ×1 IMPLANT
CANISTER SUCT 3000ML PPV (MISCELLANEOUS) ×2 IMPLANT
CHLORAPREP W/TINT 26ML (MISCELLANEOUS) ×2 IMPLANT
CONT SPEC 4OZ CLIKSEAL STRL BL (MISCELLANEOUS) ×1 IMPLANT
COVER SURGICAL LIGHT HANDLE (MISCELLANEOUS) ×2 IMPLANT
DERMABOND ADVANCED (GAUZE/BANDAGES/DRESSINGS) ×1
DERMABOND ADVANCED .7 DNX12 (GAUZE/BANDAGES/DRESSINGS) IMPLANT
DRAIN CHANNEL 19F RND (DRAIN) ×1 IMPLANT
DRAPE LAPAROSCOPIC ABDOMINAL (DRAPES) ×2 IMPLANT
DRSG PAD ABDOMINAL 8X10 ST (GAUZE/BANDAGES/DRESSINGS) ×1 IMPLANT
ELECT REM PT RETURN 9FT ADLT (ELECTROSURGICAL) ×2
ELECTRODE REM PT RTRN 9FT ADLT (ELECTROSURGICAL) ×1 IMPLANT
EVACUATOR SILICONE 100CC (DRAIN) ×1 IMPLANT
GAUZE SPONGE 4X4 12PLY STRL (GAUZE/BANDAGES/DRESSINGS) IMPLANT
GAUZE SPONGE 4X4 12PLY STRL LF (GAUZE/BANDAGES/DRESSINGS) ×1 IMPLANT
GLOVE BIO SURGEON STRL SZ 6.5 (GLOVE) ×4 IMPLANT
GOWN STRL REUS W/ TWL LRG LVL3 (GOWN DISPOSABLE) ×3 IMPLANT
GOWN STRL REUS W/TWL LRG LVL3 (GOWN DISPOSABLE) ×6
KIT BASIN OR (CUSTOM PROCEDURE TRAY) ×2 IMPLANT
KIT ROOM TURNOVER OR (KITS) ×2 IMPLANT
MARKER SKIN DUAL TIP RULER LAB (MISCELLANEOUS) ×2 IMPLANT
NS IRRIG 1000ML POUR BTL (IV SOLUTION) ×2 IMPLANT
PACK GENERAL/GYN (CUSTOM PROCEDURE TRAY) ×2 IMPLANT
PAD ABD 8X10 STRL (GAUZE/BANDAGES/DRESSINGS) ×1 IMPLANT
PAD ARMBOARD 7.5X6 YLW CONV (MISCELLANEOUS) ×4 IMPLANT
STAPLER VISISTAT 35W (STAPLE) ×1 IMPLANT
SUT MNCRL AB 3-0 PS2 18 (SUTURE) ×5 IMPLANT
SUT MON AB 4-0 PC3 18 (SUTURE) ×2 IMPLANT
SUT MON AB 5-0 PS2 18 (SUTURE) ×1 IMPLANT
SUT PROLENE 3 0 PS 2 (SUTURE) ×3 IMPLANT
SUT SILK 3 0 SH 30 (SUTURE) ×1 IMPLANT
SUT VIC AB 3-0 SH 18 (SUTURE) ×2 IMPLANT
TOWEL OR 17X24 6PK STRL BLUE (TOWEL DISPOSABLE) ×2 IMPLANT
TOWEL OR 17X26 10 PK STRL BLUE (TOWEL DISPOSABLE) ×2 IMPLANT
WATER STERILE IRR 1000ML POUR (IV SOLUTION) IMPLANT

## 2017-06-01 NOTE — Anesthesia Procedure Notes (Addendum)
Procedure Name: LMA Insertion Date/Time: 06/01/2017 7:42 AM Performed by: Kalani Baray T, CRNA Pre-anesthesia Checklist: Patient identified, Emergency Drugs available, Suction available and Patient being monitored Patient Re-evaluated:Patient Re-evaluated prior to induction Oxygen Delivery Method: Circle system utilized Preoxygenation: Pre-oxygenation with 100% oxygen Induction Type: IV induction Ventilation: Mask ventilation without difficulty LMA: LMA inserted LMA Size: 4.0 Number of attempts: 1 Airway Equipment and Method: Patient positioned with wedge pillow Placement Confirmation: positive ETCO2 and breath sounds checked- equal and bilateral Tube secured with: Tape Dental Injury: Teeth and Oropharynx as per pre-operative assessment

## 2017-06-01 NOTE — Anesthesia Postprocedure Evaluation (Signed)
Anesthesia Post Note  Patient: Melanie Moss  Procedure(s) Performed: EXPLANT OF RIGHT 480 MENTOR BREAST IMPLANT (Right Chest)     Patient location during evaluation: PACU Anesthesia Type: General Level of consciousness: awake and alert Pain management: pain level controlled Vital Signs Assessment: post-procedure vital signs reviewed and stable Respiratory status: spontaneous breathing, nonlabored ventilation, respiratory function stable and patient connected to nasal cannula oxygen Cardiovascular status: blood pressure returned to baseline and stable Postop Assessment: no apparent nausea or vomiting Anesthetic complications: no    Last Vitals:  Vitals:   06/01/17 1030 06/01/17 1032  BP:  (!) 147/91  Pulse: 77 75  Resp: 19 17  Temp:  (!) 36.4 C  SpO2: 100% 100%    Last Pain:  Vitals:   06/01/17 1030  TempSrc:   PainSc: 3                  Neo Yepiz COKER

## 2017-06-01 NOTE — Discharge Instructions (Signed)
Sink bath No heavy lifting Continue binder or sports bra Empty & record drainage out put 2 times a day Call doctor for a temperature higher than 101 F

## 2017-06-01 NOTE — Anesthesia Preprocedure Evaluation (Signed)
Anesthesia Evaluation  Patient identified by MRN, date of birth, ID band Patient awake    Reviewed: Allergy & Precautions, NPO status , Patient's Chart, lab work & pertinent test results  Airway Mallampati: II  TM Distance: >3 FB Neck ROM: Full    Dental  (+) Teeth Intact, Dental Advisory Given   Pulmonary    breath sounds clear to auscultation       Cardiovascular hypertension,  Rhythm:Regular Rate:Normal     Neuro/Psych    GI/Hepatic   Endo/Other    Renal/GU      Musculoskeletal   Abdominal   Peds  Hematology   Anesthesia Other Findings   Reproductive/Obstetrics                             Anesthesia Physical Anesthesia Plan  ASA: III  Anesthesia Plan: General   Post-op Pain Management:    Induction: Intravenous  PONV Risk Score and Plan: Ondansetron and Dexamethasone  Airway Management Planned: LMA  Additional Equipment:   Intra-op Plan:   Post-operative Plan:   Informed Consent: I have reviewed the patients History and Physical, chart, labs and discussed the procedure including the risks, benefits and alternatives for the proposed anesthesia with the patient or authorized representative who has indicated his/her understanding and acceptance.   Dental advisory given  Plan Discussed with: CRNA and Anesthesiologist  Anesthesia Plan Comments:         Anesthesia Quick Evaluation  

## 2017-06-01 NOTE — Op Note (Signed)
DATE OF OPERATION: 06/01/2017  LOCATION: [facility]  SATURDAY CASE  PREOPERATIVE DIAGNOSIS: exposure of a right breast implant  POSTOPERATIVE DIAGNOSIS: Same  PROCEDURE: right breast implant removal and complete capsulectomy and removal of the FlexHD  SURGEON: Davier Tramell Sanger Saren Corkern, DO  EBL: 10 cc  CONDITION: Stable  COMPLICATIONS: None  INDICATION: The patient, Melanie Moss, is a 42 y.o. female born on 05-08-1975, is here for treatment of an exposed right breast implant.   PROCEDURE DETAILS:  The patient was seen prior to surgery and marked.  The IV antibiotics were given. The patient was taken to the operating room and given a general anesthetic. A standard time out was performed and all information was confirmed by those in the room. SCDs were placed.   The chest was prepped and draped.  The area of implant exposure was identified and incorporated into the incision.  The incision was an ellipse with the previous mastectomy scar.  The #10 blade was used to excise the skin.  The silicone 594 cc Mentor implant was removed.  The pocket was irrigated with antibiotic solution and saline.  The capsule was excised completely with the bovie for better tissue closure and reduction of the thickness of the scarring.  The flexHD was also excised.  Hemostasis was achieved with electrocautery. A #15 blade was used to make a slight incision and place the #19 blake drain.  This was secured to the chest with the 3-0 Silk.  The deep layers were closed with the 3-0 Monocryl followed by the 4-0 and 5-0 Monocryl running and some vertical mattress sutures placed.  Derma bond was applied.  A sterile dressing and breast binder was applied.  The patient was allowed to wake up and taken to recovery room in stable condition at the end of the case. The family was notified at the end of the case.

## 2017-06-01 NOTE — H&P (Signed)
Melanie Moss is an 42 y.o. female.   Chief Complaint: right breast wound HPI: Melanie Moss is an 42 y.o. female here for treatment of a right breast wound.  She underwent mastectomies and exchange after expander placement.  She had an area on the right breast that was stuck down laterally and this was released.  Unfortunately the skin opened.  She presented with pain, swelling and some redness.  A washout was done and the area closed.  The skin is too thin and the patient has decided to have the implant removed.  She is not infected at this time.  There was concern that if this is left it will become exposed.  Past Medical History:  Diagnosis Date  . Anemia   . Anxiety   . Breast cancer of lower-outer quadrant of right female breast ([place]) 07/29/2015  . Family history of breast cancer   . Family history of colon cancer   . Family history of prostate cancer   . GERD (gastroesophageal reflux disease)    tums   . History of radiation therapy 03/07/16-04/27/16   right chest wall 45 Gy in 25 fractions, right mastectomy scar 16 Gy in 8 fractions  . Hyperlipidemia   . Hypertension    not currently on medication  . Personal history of chemotherapy   . Personal history of radiation therapy   . Preterm labor     Past Surgical History:  Procedure Laterality Date  . AXILLARY LYMPH NODE DISSECTION Right 09/29/2015   Procedure: AXILLARY LYMPH NODE DISSECTION;  Surgeon: Excell Seltzer, MD;  Location: [facility];  Service: General;  Laterality: Right;  . BREAST RECONSTRUCTION WITH PLACEMENT OF TISSUE EXPANDER AND FLEX HD (ACELLULAR HYDRATED DERMIS) Right 09/29/2015   Procedure: BREAST RECONSTRUCTION WITH PLACEMENT OF TISSUE EXPANDER AND FLEX HD (ACELLULAR HYDRATED DERMIS);  Surgeon: Loel Lofty Shaunak Kreis, DO;  Location: [facility];  Service: Plastics;  Laterality: Right;  . CERVICAL CERCLAGE  02/19/2011   Procedure: CERCLAGE CERVICAL;  Surgeon: Agnes Lawrence, MD;  Location: [facility];  Service: Gynecology;   Laterality: N/A;  . DILATION AND CURETTAGE OF UTERUS    . INCISION AND DRAINAGE OF WOUND Right 04/19/2017   Procedure: IRRIGATION OF RIGHT BREAST POCKET;  Surgeon: Wallace Going, DO;  Location: [facility];  Service: Plastics;  Laterality: Right;  . MASTECTOMY     right 2017  . MASTECTOMY WITH AXILLARY LYMPH NODE DISSECTION Right 09/29/2015  . MASTOPEXY Left 03/11/2017   Procedure: MASTOPEXY FOR SYMENTRY;  Surgeon: Wallace Going, DO;  Location: [facility];  Service: Plastics;  Laterality: Left;  . PORTACATH PLACEMENT N/A 09/29/2015   Procedure: INSERTION PORT-A-CATH;  Surgeon: Excell Seltzer, MD;  Location: [facility];  Service: General;  Laterality: N/A;  . REMOVAL OF BILATERAL TISSUE EXPANDERS WITH PLACEMENT OF BILATERAL BREAST IMPLANTS Right 03/11/2017   Procedure: REMOVAL OF RIGHT TISSUE EXPANDERS WITH PLACEMRIGHT SILICONE  BREAST IMPLANTS;  Surgeon: Wallace Going, DO;  Location: [facility];  Service: Plastics;  Laterality: Right;  . SIMPLE MASTECTOMY WITH AXILLARY SENTINEL NODE BIOPSY Right 09/29/2015   Procedure: RIGHT TOTAL  MASTECTOMY WITH AXILLARY SENTINEL NODE BIOPSY;  Surgeon: Excell Seltzer, MD;  Location: [facility];  Service: General;  Laterality: Right;    Family History  Problem Relation Age of Onset  . Hypertension Mother   . Hypertension Father   . Prostate cancer Father 89  . Breast cancer Maternal Aunt 38  . Breast cancer Paternal  Aunt 60  . Cirrhosis Maternal Uncle   . Prostate cancer Paternal Uncle   . Throat cancer Maternal Aunt        non smoker  . Prostate cancer Paternal Uncle   . Colon cancer Cousin        maternal first cousin dx in his 30s  . Lung cancer Cousin        smoker   Social History:  reports that  has never smoked. she has never used smokeless tobacco. She reports that she does not drink alcohol or use drugs.  Allergies: No Known Allergies  Medications Prior to Admission  Medication  Sig Dispense Refill  . B Complex-C (SUPER B COMPLEX PO) Take 1 tablet by mouth daily.    . ciprofloxacin (CIPRO) 500 MG tablet Take 500 mg by mouth 2 (two) times daily.  0  . ferrous sulfate 325 (65 FE) MG tablet Take 325 mg by mouth daily.     Marland Kitchen HYDROcodone-acetaminophen (NORCO/VICODIN) 5-325 MG tablet Take 1 tablet by mouth every 6 (six) hours as needed for moderate pain.    . Multiple Vitamin (MULTIVITAMIN) tablet Take 1 tablet by mouth daily.    . naproxen sodium (ALEVE) 220 MG tablet Take 440 mg by mouth daily as needed (for pain or headache).    . Neomycin-Bacitracin-Polymyxin (TRIPLE ANTIBIOTIC) [phone] OINT Apply 1 application topically 2 (two) times daily.    . Neratinib Maleate (NERLYNX) 40 MG tablet Take 6 tablets (240 mg total) by mouth daily. Take with food. 180 tablet 3  . tamoxifen (NOLVADEX) 20 MG tablet Take 1 tablet (20 mg total) by mouth daily. 90 tablet 3  . diphenoxylate-atropine (LOMOTIL) 2.5-0.025 MG tablet TAKE 1 TO 2 TABLETS BY MOUTH 4 TIMES DAILY AS NEEDED FOR DIARRHEA OR LOOSE STOOLS (Patient not taking: Reported on 04/04/2017) 30 tablet 1  . hydrochlorothiazide (HYDRODIURIL) 25 MG tablet Take 1 tablet (25 mg total) by mouth daily. 30 tablet 6  . mirtazapine (REMERON) 15 MG tablet Take 1 tablet (15 mg total) by mouth at bedtime. 30 tablet 2    Results for orders placed or performed during the hospital encounter of 06/01/17 (from the past 48 hour(s))  Pregnancy, urine POC     Status: None   Collection Time: 06/01/17  6:22 AM  Result Value Ref Range   Preg Test, Ur NEGATIVE NEGATIVE    Comment:        THE SENSITIVITY OF THIS METHODOLOGY IS >24 mIU/mL    No results found.  Review of Systems  Constitutional: Negative.   HENT: Negative.   Eyes: Negative.   Respiratory: Negative.   Cardiovascular: Negative.   Gastrointestinal: Negative.   Genitourinary: Negative.   Musculoskeletal: Negative.   Skin: Negative.   Neurological: Negative.    Psychiatric/Behavioral: Negative.     There were no vitals taken for this visit. Physical Exam  Constitutional: She is oriented to person, place, and time. She appears well-developed and well-nourished.  HENT:  Head: Normocephalic and atraumatic.  Eyes: Conjunctivae and EOM are normal. Pupils are equal, round, and reactive to light.  Cardiovascular: Normal rate.  Respiratory: Effort normal.  GI: Soft. She exhibits no distension.  Neurological: She is alert and oriented to person, place, and time.  Skin: Skin is warm. No rash noted. No erythema. No pallor.  Psychiatric: She has a normal mood and affect. Her behavior is normal. Judgment and thought content normal.     Assessment/Plan Plan removal of the right breast implant.  Lyndee Leo  S Ferrell Claiborne, DO 06/01/2017, 7:06 AM

## 2017-06-01 NOTE — Transfer of Care (Signed)
Immediate Anesthesia Transfer of Care Note  Patient: Melanie Moss  Procedure(s) Performed: EXPLANT OF RIGHT 480 MENTOR BREAST IMPLANT (Right Chest)  Patient Location: PACU  Anesthesia Type:General  Level of Consciousness: awake, alert  and oriented  Airway & Oxygen Therapy: Patient Spontanous Breathing and Patient connected to nasal cannula oxygen  Post-op Assessment: Report given to RN and Post -op Vital signs reviewed and stable  Post vital signs: Reviewed and stable  Last Vitals:  Vitals:   06/01/17 0657  BP: 136/83  Pulse: 69  Resp: 16  Temp: 37 C  SpO2: 100%    Last Pain:  Vitals:   06/01/17 0657  TempSrc: Oral  PainSc: 5       Patients Stated Pain Goal: 5 (68/03/21 2248)  Complications: No apparent anesthesia complications

## 2017-06-02 ENCOUNTER — Encounter (HOSPITAL_COMMUNITY): Payer: Self-pay | Admitting: Plastic Surgery

## 2017-07-26 ENCOUNTER — Telehealth: Payer: Self-pay | Admitting: Hematology

## 2017-07-26 NOTE — Telephone Encounter (Signed)
Appointments rescheduled per 4/5 sch msg

## 2017-07-30 ENCOUNTER — Ambulatory Visit: Payer: Self-pay | Admitting: Plastic Surgery

## 2017-07-30 DIAGNOSIS — Z9011 Acquired absence of right breast and nipple: Secondary | ICD-10-CM

## 2017-07-31 NOTE — Pre-Procedure Instructions (Signed)
Melanie Moss  07/31/2017      [id number] [place], [address] [place] [address] Phone: [phone] Fax: [phone]  [place], [place] - [address] [place] [address] Phone: [phone] Fax: [place], [place] - [address] [place] [address] Phone: [phone] Fax: [phone]    Your procedure is scheduled on Thurs. April 18  Report to [facility] Admitting at 9:30 A.M.  Call this number if you have problems the morning of surgery:  [phone]   Remember:  Do not eat food or drink liquids after midnight on Wed. April 17   Take these medicines the morning of surgery with A SIP OF WATER : diazepam (valium), hydrocodone if needed, tamoxifen (nolvadex),neratinib maleate (nerlynx)            7 days prior to surgery STOP taking any Aspirin(unless otherwise instructed by your surgeon), Aleve, Naproxen, Ibuprofen, Motrin, Advil, Goody's, BC's, all herbal medications, fish oil, and all vitamins   Do not wear jewelry, make-up or nail polish.  Do not wear lotions, powders, or perfumes, or deodorant.  Do not shave 48 hours prior to surgery.  Men may shave face and neck.  Do not bring valuables to the hospital.  [facility] is not responsible for any belongings or valuables.  Contacts, dentures or bridgework may not be worn into surgery.  Leave your suitcase in the car.  After surgery it may be brought to your room.  For patients admitted to the hospital, discharge time will be determined by your treatment team.  Patients discharged the day of surgery will not be allowed to drive home.    Special instructions:  [facility]- Preparing For Surgery  Before surgery, you can play an important role. Because skin is not sterile, your skin needs to be as  free of germs as possible. You can reduce the number of germs on your skin by washing with CHG (chlorahexidine gluconate) Soap before surgery.  CHG is an antiseptic cleaner which kills germs and bonds with the skin to continue killing germs even after washing.  Please do not use if you have an allergy to CHG or antibacterial soaps. If your skin becomes reddened/irritated stop using the CHG.  Do not shave (including legs and underarms) for at least 48 hours prior to first CHG shower. It is OK to shave your face.  Please follow these instructions carefully.   1. Shower the NIGHT BEFORE SURGERY and the MORNING OF SURGERY with CHG.   2. If you chose to wash your hair, wash your hair first as usual with your normal shampoo.  3. After you shampoo, rinse your hair and body thoroughly to remove the shampoo.  4. Use CHG as you would any other liquid soap. You can apply CHG directly to the skin and wash gently with a scrungie or a clean washcloth.   5. Apply the CHG Soap to your body ONLY FROM THE NECK DOWN.  Do not use on open wounds or open sores. Avoid contact with your eyes, ears, mouth and genitals (private parts). Wash Face and genitals (private parts)  with your normal soap.  6. Wash thoroughly, paying special attention to the area where your surgery will be performed.  7. Thoroughly rinse your body with warm water from the  neck down.  8. DO NOT shower/wash with your normal soap after using and rinsing off the CHG Soap.  9. Pat yourself dry with a CLEAN TOWEL.  10. Wear CLEAN PAJAMAS to bed the night before surgery, wear comfortable clothes the morning of surgery  11. Place CLEAN SHEETS on your bed the night of your first shower and DO NOT SLEEP WITH PETS.    Day of Surgery: Do not apply any deodorants/lotions. Please wear clean clothes to the hospital/surgery center.      Please read over the following fact sheets that you were given. Coughing and Deep Breathing and Surgical Site  Infection Prevention

## 2017-07-31 NOTE — Progress Notes (Signed)
[place]  [phone] Fax:([phone]) [phone]  Clinic Follow Up Note   Patient Care Team: Lucianne Lei, MD as PCP - General (Family Medicine) Excell Seltzer, MD as Consulting Physician (General Surgery) Truitt Merle, MD as Consulting Physician (Hematology) Gardenia Phlegm, NP as Nurse Practitioner (Hematology and Oncology) [name], MD as Consulting Physician (Radiation Oncology)   Date of Service:  08/02/2017  CHIEF COMPLAINTS:  Follow up right breast cancer  Oncology History   Breast cancer of lower-outer quadrant of right female breast [facility])   Staging form: Breast, AJCC 7th Edition     Clinical stage from 08/03/2015: Stage IA (T1c, N0, M0) - Unsigned     Pathologic stage from 09/29/2015: Stage IIIA (T3(m), N1a, cM0) - Signed by Truitt Merle, MD on 10/17/2015         Breast cancer of lower-outer quadrant of right female breast ([place])   07/25/2015 Mammogram    diagnostic mammogram and ultrasound showed a 11 mm mass in the 8:00 position of the right breast, small irregular lesion in the retroareolar regio of the right breast, contiguous with the dermis      07/26/2015 Initial Diagnosis    Breast cancer of lower-outer quadrant of right female breast ([place])      07/26/2015 Initial Biopsy    right breast needle core biopsy at 8:00 position (1), invasive ductal carcinoma, grade 3, 6:30 o'clock position invasive (2) and in situ ductal carcinoma, grade 1-2, lymphovascular invasion is identified      07/26/2015 Receptors her2    (1) ER 95% positive, PR 95% positive, Ki-67 30%, HER-2 positive with ratio 2.04, copy # 5.20, (2) ER 90% positive, PR 95% positive, Ki-67 10%, HER-2 negative.      08/02/2015 Imaging    breast MRI showed extensive mass  and non-mass enhancement in the lower outer quadrant of the right breast  spanning 13 cm, focal enhancement within the right nipple, at least 3-4 abnormal appearing right axillary lymph node.      09/29/2015  Surgery    right mastectomy and axillar node dissection       09/29/2015 Pathology Results    Right mastectomy and axillary node dissection showed multifocal mammary carcinoma with ductal and lobular features, tumor size 13 cm, 2.1 cm, 1.9 cm, and 0.5 cm, grade 2-3, anterior margins were positive, 2 nodes with metastasis and one with isolated cells      11/01/2015 - 02/14/2016 Chemotherapy    TCHP every 3 weeks, for 6 cycles, followed by herceptin maintenance therapy for a total of one year       03/06/2016 - 11/01/2016 Antibody Plan    Maintenance Herceptin and perjeta, every 3 weeks      03/07/2016 - 04/27/2016 Radiation Therapy    03/07/16-04/27/16 Site/dose: 1) 62fd right chest wall/ 45 Gy in 25 fractions 2) Right mastectomy scar / 16 Gy in 8 fractions      05/21/2016 -  Anti-estrogen oral therapy    Tamoxifen 20 mg      10/17/2016 Mammogram    IMPRESSION: No mammographic evidence of malignancy. A result letter of this screening mammogram will be mailed directly to the patient.      10/22/2016 Echocardiogram    Impressions:  - Normal study. Stable GLPSS at -21%. LVEF 60-65%.      11/09/2016 -  Anti-estrogen oral therapy     Neratinib 2440mdaily started on 11/09/2016      02/04/2017 [facility]    [facility]  Charlestine Massed, NP      03/11/2017 Surgery    REMOVAL OF RIGHT TISSUE EXPANDERS WITH PLACEMRIGHT SILICONE  BREAST IMPLANTS and left MASTOPEXY FOR SYMENTRY by Dr. Marla Roe  03/11/17      03/11/2017 Pathology Results    Diagnosis 03/11/17 1. Breast, capsule, right inferior BENIGN FIBROVASCULAR TISSUE 2. Breast, Mammoplasty, left BENIGN BREAST TISSUE NEGATIVE FOR ATYPIA OR MALIGNANCY       HISTORY OF PRESENTING ILLNESS (08/03/2015):  Melanie Moss 42 y.o. female is here because of Her newly diagnosed right breast cancer. She is accompanied by her fianc, sister and mother to our multidisciplinary breast clinic today.  She felt a  breast lump and noticed mild intermittent breast pain 2 weeks ago, no nipple discharge or skin change. She also has moderate fatigue and dyspnea on moderate to heavy exertion for a few months, no weight loss, no other pain or other symptoms.  She had normal mammogram 6 years ago, for a small lump which felt to be benign and no biopsy was done.    She has irregular period, twice a month, it lasts about 5-7 days, heavy for 3 days, she changes 6-7 times a day. She was found to be anemic recently, and has started taking oral iron pill a few weeks ago, tolerates well.  GYN HISTORY  Menarchal: 10  LMP: 07/29/2015 Contraceptive: no  HRT: n/a  G4P1: 3 miscarriage, one 64 yo son, no plan to have more children   CURRENT THERAPY:  1.Tamoxifen 20 mg started 05/21/16. 2. Neratinib 240m daily started on 11/09/2016  INTERIM HISTORY:  PGERLENE GLASSBURNreturns for follow-up. She presents to the clinic today by herself. She reports she is doing well overall. She notes to have loose/watery diarrhea about twice daily. She states she uses imodium once a day. She is compliant with Tamoxifen and Neratinib and reports she has hot flashes at night. She is able to sleep and reports that she does not notice much hot flash during the day. She reports she snacks a lot and has a low appetite at baseline. She notes she is still fatigued and she believes this is do to her anemia. She has not had a period since July 2018. She reports that she had her implants removed due to an infection 6 weeks ago. She is scheduled for reconstruction surgery on 08/08/17.  On review of systems, pt denies abdominal pain, SOB, CP or any other complaints at this time. Pertinent positives are listed and detailed within the above HPI.    MEDICAL HISTORY:  Past Medical History:  Diagnosis Date  . Anemia   . Anxiety   . Breast [place] of [place] breast [place] 07/29/2015  . Family history of breast cancer   . Family history of  colon cancer   . Family history of prostate cancer   . GERD (gastroesophageal reflux disease)    tums   . History of radiation therapy 03/07/16-04/27/16   right chest wall 45 Gy in 25 fractions, right mastectomy scar 16 Gy in 8 fractions  . Hyperlipidemia   . Hypertension   . Lymphedema   . Personal history of chemotherapy   . Personal history of radiation therapy   . Preterm labor    SURGICAL HISTORY: Past Surgical History:  Procedure Laterality Date  . AXILLARY LYMPH NODE DISSECTION Right 09/29/2015   Procedure: AXILLARY LYMPH NODE DISSECTION;  Surgeon: BExcell Seltzer MD;  [place]  Service: General;  Laterality: Right;  .  BREAST IMPLANT REMOVAL Right 06/01/2017   Procedure: EXPLANT OF RIGHT 480 MENTOR BREAST IMPLANT;  Surgeon: Wallace Going, DO;  Location: [place];  Service: Plastics;  Laterality: Right;  . BREAST RECONSTRUCTION WITH PLACEMENT OF TISSUE EXPANDER AND FLEX HD (ACELLULAR HYDRATED DERMIS) Right 09/29/2015   Procedure: BREAST RECONSTRUCTION WITH PLACEMENT OF TISSUE EXPANDER AND FLEX HD (ACELLULAR HYDRATED DERMIS);  Surgeon: Loel Lofty Dillingham, DO;  Location: [place];  Service: Plastics;  Laterality: Right;  . CERVICAL CERCLAGE  02/19/2011   Procedure: CERCLAGE CERVICAL;  Surgeon: Agnes Lawrence, MD;  Location: [facility];  Service: Gynecology;  Laterality: N/A;  . DILATION AND CURETTAGE OF UTERUS    . INCISION AND DRAINAGE OF WOUND Right 04/19/2017   Procedure: IRRIGATION OF RIGHT BREAST POCKET;  Surgeon: Wallace Going, DO;  Location: [place];  Service: Plastics;  Laterality: Right;  . MASTECTOMY     right 2017  . MASTECTOMY WITH AXILLARY LYMPH NODE DISSECTION Right 09/29/2015  . MASTOPEXY Left 03/11/2017   Procedure: MASTOPEXY FOR SYMENTRY;  Surgeon: Wallace Going, DO;  Location: [place];  Service: Plastics;  Laterality: Left;  . PORT-A-CATH REMOVAL  10/2016  . PORTACATH PLACEMENT N/A 09/29/2015   Procedure:  INSERTION PORT-A-CATH;  Surgeon: Excell Seltzer, MD;  Location: [place];  Service: General;  Laterality: N/A;  . REMOVAL OF BILATERAL TISSUE EXPANDERS WITH PLACEMENT OF BILATERAL BREAST IMPLANTS Right 03/11/2017   Procedure: REMOVAL OF RIGHT TISSUE EXPANDERS WITH PLACEMRIGHT SILICONE  BREAST IMPLANTS;  Surgeon: Wallace Going, DO;  Location: [place];  Service: Plastics;  Laterality: Right;  . SIMPLE MASTECTOMY WITH AXILLARY SENTINEL NODE BIOPSY Right 09/29/2015   Procedure: RIGHT TOTAL  MASTECTOMY WITH AXILLARY SENTINEL NODE BIOPSY;  Surgeon: Excell Seltzer, MD;  Location: [place];  Service: General;  Laterality: Right;   SOCIAL HISTORY: Social History   Socioeconomic History  . Marital status: Single    Spouse name: Not on file  . Number of children: 1  . Years of education: Not on file  . Highest education level: Not on file  Occupational History  . Not on file  Social Needs  . Financial resource strain: Not on file  . Food insecurity:    Worry: Not on file    Inability: Not on file  . Transportation needs:    Medical: Not on file    Non-medical: Not on file  Tobacco Use  . Smoking status: Never Smoker  . Smokeless tobacco: Never Used  Substance and Sexual Activity  . Alcohol use: No  . Drug use: No  . Sexual activity: Yes    Birth control/protection: None  Lifestyle  . Physical activity:    Days per week: Not on file    Minutes per session: Not on file  . Stress: Not on file  Relationships  . Social connections:    Talks on phone: Not on file    Gets together: Not on file    Attends religious service: Not on file    Active member of club or organization: Not on file    Attends meetings of clubs or organizations: Not on file    Relationship status: Not on file  . Intimate partner violence:    Fear of current or ex partner: Not on file    Emotionally abused: Not on file    Physically abused: Not on file    Forced sexual activity: Not on file    Other Topics Concern  .  Not on file  Social History Narrative  . Not on file   FAMILY HISTORY: Family History  Problem Relation Age of Onset  . Hypertension Mother   . Hypertension Father   . Prostate cancer Father 26  . Breast cancer Maternal Aunt 62  . Breast cancer Paternal Aunt 37  . Cirrhosis Maternal Uncle   . Prostate cancer Paternal Uncle   . Throat cancer Maternal Aunt        non smoker  . Prostate cancer Paternal Uncle   . Colon cancer Cousin        maternal first cousin dx in his 48s  . Lung cancer Cousin        smoker   ALLERGIES:  has No Known Allergies.  MEDICATIONS:  Current Outpatient Medications  Medication Sig Dispense Refill  . B Complex-C (SUPER B COMPLEX PO) Take 1 tablet by mouth daily.    . diazepam (VALIUM) 2 MG tablet Take 2 mg by mouth 2 (two) times daily as needed for muscle spasms.     . ferrous sulfate 325 (65 FE) MG tablet Take 325 mg by mouth daily.     Marland Kitchen HYDROcodone-acetaminophen (NORCO) 5-325 MG tablet Take 1 tablet by mouth every 8 (eight) hours as needed for moderate pain. 20 tablet 0  . irbesartan-hydrochlorothiazide (AVALIDE) 150-12.5 MG tablet Take 1 tablet by mouth daily.    Marland Kitchen loperamide (IMODIUM A-D) 2 MG tablet Take 2 mg by mouth daily as needed for diarrhea or loose stools.    . naproxen sodium (ALEVE) 220 MG tablet Take 220 mg by mouth daily as needed (headaches).    . Neratinib Maleate (NERLYNX) 40 MG tablet Take 6 tablets (240 mg total) by mouth daily. Take with food. 180 tablet 5  . Prenatal Vit-Fe Fumarate-FA (PRENATAL PO) Take 1 tablet by mouth daily. Iron free prenatal    . tamoxifen (NOLVADEX) 20 MG tablet Take 1 tablet (20 mg total) by mouth daily. 90 tablet 3  . mirtazapine (REMERON) 15 MG tablet Take 1 tablet (15 mg total) by mouth at bedtime. (Patient not taking: Reported on 08/02/2017) 30 tablet 2  . ondansetron (ZOFRAN) 4 MG tablet Take 1 tablet (4 mg total) by mouth daily as needed for nausea or vomiting. (Patient not  taking: Reported on 07/26/2017) 10 tablet 1   No current facility-administered medications for this visit.    Facility-Administered Medications Ordered in Other Visits  Medication Dose Route Frequency Provider Last Rate Last Dose  . 0.9 %  sodium chloride infusion   Intravenous Once Truitt Merle, MD      . alteplase (CATHFLO ACTIVASE) injection 2 mg  2 mg Intracatheter Once PRN Truitt Merle, MD      . heparin lock flush 100 unit/mL  250 Units Intracatheter Once PRN Truitt Merle, MD      . heparin lock flush 100 unit/mL  500 Units Intravenous Once PRN Truitt Merle, MD      . sodium chloride 0.9 % injection 10 mL  10 mL Intracatheter PRN Truitt Merle, MD      . sodium chloride 0.9 % injection 10 mL  10 mL Intravenous PRN Truitt Merle, MD      . sodium chloride 0.9 % injection 3 mL  3 mL Intravenous Once PRN Truitt Merle, MD      . sodium chloride flush (NS) 0.9 % injection 10 mL  10 mL Intravenous PRN Truitt Merle, MD        REVIEW OF SYSTEMS:  Constitutional: Denies fevers, chills  (+) weight loss (+) loss of appetite  (+) fatigue  Eyes:Denies blurry vision, double vision or watery eyes Ears, nose, mouth, throat, and face: Denies mucositis or sore throat Respiratory: Denies cough, dyspnea or wheezes  Cardiovascular: Denies palpitation, chest discomfort or lower extremity swelling Gastrointestinal:  Denies nausea, heartburn, (+) diarrhea once daily, controlled  Skin: Denies abnormal skin rashes Lymphatics: Denies new lymphadenopathy or easy bruising  Neurological:Denies numbness, tingling or new weaknesses  Behavioral/Psych: Mood is stable, no new changes  All other systems were reviewed with the patient and are negative.  PHYSICAL EXAMINATION:  ECOG PERFORMANCE STATUS: 1   Vitals:   08/02/17 0905  BP: 126/80  Pulse: 82  Resp: 18  Temp: 97.7 F (36.5 C)  SpO2: 100%   Filed Weights   08/02/17 0905  Weight: 129 lb 6.4 oz (58.7 kg)     GENERAL:alert, no distress and comfortable SKIN: skin color,  texture, turgor are normal, Several scattered acne-like rash in her upper front chest.  EYES: normal, conjunctiva are pink and non-injected, sclera clear OROPHARYNX:no exudate, no erythema and lips, buccal mucosa, and tongue normal  NECK: supple, thyroid normal size, non-tender, without nodularity LYMPH:  no palpable lymphadenopathy in the cervical, axillary or inguinal LUNGS: clear to auscultation and percussion with normal breathing effort HEART: regular rate & rhythm and no murmurs and no lower extremity edema ABDOMEN:abdomen soft, non-tender and normal bowel sounds Musculoskeletal:no cyanosis of digits and no clubbing  PSYCH: alert & oriented x 3 with fluent speech NEURO: no focal motor/sensory deficits Breasts:  Status post right mastectomy (+) Implant was removed, surgical incison has healed. No erythema, discharge or palpable mass. Palpation of the left breast and bilateral axilla showed no palpable mass.   LABORATORY DATA:  I have reviewed the data as listed CBC Latest Ref Rng & Units 08/02/2017 08/01/2017 06/01/2017  WBC 3.9 - 10.3 K/uL 5.6 6.3 6.6  Hemoglobin 11.6 - 15.9 g/dL 11.3(L) 11.5(L) 9.6(L)  Hematocrit 34.8 - 46.6 % 34.8 35.9(L) 30.2(L)  Platelets 145 - 400 K/uL 197 224 315    CMP Latest Ref Rng & Units 08/02/2017 08/01/2017 06/01/2017  Glucose 70 - 140 mg/dL 94 94 103(H)  BUN 7 - 26 mg/dL _0 Creatinine 0.60 - 1.10 mg/dL 1.03 0.97 0.98  Sodium 136 - 145 mmol/L 143 140 140  Potassium 3.5 - 5.1 mmol/L 3.2(L) 3.7 3.5  Chloride 98 - 109 mmol/L 106 106 106  CO2 22 - 29 mmol/L _1 Calcium 8.4 - 10.4 mg/dL 9.6 9.3 9.0  Total Protein 6.4 - 8.3 g/dL 7.3 - -  Total Bilirubin 0.2 - 1.2 mg/dL 0.4 - -  Alkaline Phos 40 - 150 U/L 49 - -  AST 5 - 34 U/L 26 - -  ALT 0 - 55 U/L 27 - -    PATHOLOGY:  Diagnosis 03/11/17 1. Breast, capsule, right inferior BENIGN FIBROVASCULAR TISSUE 2. Breast, Mammoplasty, left BENIGN BREAST TISSUE NEGATIVE FOR ATYPIA OR  MALIGNANCY   Diagnosis 09/29/2015 1. Lymph node, sentinel, biopsy, Right axillary #1 - ONE BENIGN LYMPH NODE WITH NO TUMOR SEEN (0/1). 2. Lymph node, sentinel, biopsy, Right axillary #2 - ONE LYMPH NODE POSITIVE FOR METASTATIC MAMMARY CARCINOMA (1/1). 3. Lymph node, sentinel, biopsy, Right axillary #4 - ONE BENIGN LYMPH NODE WITH NO TUMOR SEEN (0/1). 4. 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 5. Breast, simple mastectomy, Right breast-suture  marks axillary tail - MULTIFOCAL MAMMARY CARCINOMA WITH DUCTAL AND LOBULAR FEATURES. - LARGEST FOCUS OF INVASIVE MAMMARY CARCINOMA IS GRADE 2, SHOWS ASSOCIATED LOBULAR CARCINOMA IN SITU AND MEASURES 13 CM IN GREATEST DIMENSION. - SECOND LARGEST FOCUS SHOWS INVASIVE GRADE 3 MAMMARY CARCINOMA AND SPANS 2.1 CM IN GREATEST DIMENSION. - THIRD LARGEST FOCUS SHOWS INVASIVE GRADE 2 MAMMARY CARCINOMA WITH LOBULAR CARCINOMA IN SITU AND MEASURES 1.9 CM IN GREATEST DIMENSION. - SMALLEST FOCUS SHOWS INVASIVE GRADE 3 MAMMARY CARCINOMA AND MEASURES 0.5 CM IN GREATEST DIMENSION. - ANTERIOR INFERIOR SOFT TISSUE MARGIN DEMONSTRATES BROAD POSITIVITY FOR INVASIVE MAMMARY CARCINOMA. - OTHER MARGINS ARE NEGATIVE. - SEE ONCOLOGY TEMPLATE. 6. Lymph node, sentinel, biopsy, Right axillary #3 - ONE BENIGN LYMPH NODE WITH NO TUMOR SEEN (0/1). 7. Lymph node, sentinel, biopsy, Right axillary #6 - ONE LYMPH NODE WITH ISOLATED TUMOR CELLS. 8. Lymph nodes, regional resection, Right axillary contents - ELEVEN BENIGN LYMPH NODES WITH NO TUMOR SEEN (0/11). Specimen, including laterality and lymph node sampling (sentinel, non-sentinel): Right breast with right sentinel and non-sentinel lymph nodes. Procedure: Right modified mastectomy with sentinel lymph node biopsy and axillary dissection. Histologic type: Multifocal mammary carcinoma with ductal and lobular features. Largest focus (13 cm): Grade:  2. Tubule formation: 3. Nuclear pleomorphism: 2. Mitotic: 1. Second largest focus (2.1 cm): Grade: 3. Tubule formation: 3. Nuclear pleomorphism: 2. Mitotic: 3. Third largest focus (1.9 cm): Grade: 2. Tubule formation: 3. Nuclear pleomorphism: 2. Mitotic: 1. Smallest focus (0.5 cm): Grade: 3. Tubule formation: 3. Nuclear pleomorphism: 2. Mitotic: 3. Tumor size (gross measurements): 13 cm, 2.1 cm, 1.9 cm and 0.5 cm. Margins: Invasive, distance to closest margin: Invasive mammary carcinoma broadly involves the anterior inferior soft tissue margin. In-situ, distance to closest margin: Margin uninvolved by in situ carcinoma. If margin positive, focally or broadly: Broadly positive. Lymphovascular invasion: Yes, identified. Ductal carcinoma in situ: Not identified. Grade: Not applicable. Extensive intraductal component: Not applicable. Lobular neoplasia: Yes, lobular carcinoma in situ is present. Tumor focality: Multifocal. Treatment effect: Not applicable. If present, treatment effect in breast tissue, lymph nodes or both: Not applicable. Extent of tumor: Skin: Not involved. Nipple: Not involved. Skeletal muscle: Not received. Lymph nodes: Examined: 6 Sentinel. 11 Non-sentinel. 17 Total. Lymph nodes with metastasis: 2 with metastasis and 1 with isolated tumor cells. Isolated tumor cells (< 0.2 mm): 1. Micrometastasis: (> 0.2 mm and < 2.0 mm): 0. Macrometastasis: (> 2.0 mm): 2. Extracapsular extension: Not identified.  Breast prognostic profile: Performed on previous case, [id number]. Right 8 o'clock biopsy 12 cm from the nipple: Estrogen receptor: 95%, positive. Progesterone receptor: 95%, positive. Her-2 neu: 2.04 ratio, positive. Ki-67: 30%. Right breast needle core biopsy 6:30 o'clock position 1 cm from the nipple: Estrogen receptor: 90%, positive. Progesterone receptor: 95%, positive. Her-2 neu: 1.39 ratio, negative. Ki-67: 10%. Non-neoplastic breast: No  significant non-neoplastic breast findings.  TNM: pT3(m), pN1a. Comments: A cytokeratin AE1/AE3 immunohistochemical stain is performed on eight blocks containing lymph node tissue (eight stains total). The morphology coupled with the staining pattern is consistent with the above findings. An E-cadherin immunohistochemical stain is also performed on four blocks from the right simple mastectomy specimen. The staining pattern coupled with the morphology is consistent with the above findings. As there was Her-2 neu positivity in one of the biopsies and both biopsies were positive for estrogen receptor, a repeat breast prognostic profile will not be performed unless otherwise requested. Dr. Lyndon Code has seen selected slides 5E, 20F, 5G, 5J, 5N, 5O, and 5P with agreement that  the tumor is a mulitfocal invasive mammary carcinoma with a mixed ductal and lobular phenotype. Dr. Tresa Moore has also seen a selected slide (5P) with agreement of the positive anterior inferior margin. (RH:ecj 10/03/2015)   RADIOGRAPHIC STUDIES: I have personally reviewed the radiological images as listed and agreed with the findings in the report. No new scans  CT chest, abdomen and pelvis  10/27/2015 IMPRESSION: Status post right mastectomy and right axillary lymph node Dissection. No findings specific for metastatic disease. Scattered hepatic cysts measuring up to 12 mm. A single 8 mm hypoenhancing lesion is technically indeterminate, but also likely benign, although motion degraded. Consider attention on follow-up.  Bone scan 10/28/2015 IMPRESSION: Small focus of increased radiotracer uptake within the right lateral skull at the region of the right temporal bone. CT of the head may be considered for further evaluation. Otherwise no evidence of osseous metastatic disease in the axial skeleton.  CT head with and without contrast 11/07/2015 IMPRESSION: 1. Indeterminate 10-11 mm area of heterogeneous bone mineralization along  the right sphenoid wing which may correspond to the recent bone scan finding. This might be physiologic. A Repeat CT head (e.g. in 8-12 weeks, Noncontrast should suffice but with and without contrast could be done if repeat brain staging is desired) may be most valuable to evaluate stability. 2. No CT evidence of metastatic disease to the brain. Mild nonspecific white matter changes.  Echo 04/10/2016 Impressions: - Normal LV size with EF 65-70%. Strain as above. Normal RV size and systolic function. No significant valvular abnormalities.  Mammogram 10/17/2016 IMPRESSION: No mammographic evidence of malignancy. A result letter of this screening mammogram will be mailed directly to the patient.  ASSESSMENT & PLAN:  42 y.o. African-American female, premenopausal, presented with a palpable right breast mass.  1. 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 Her initial biopsy showed one triple positive disease, the other was ER/PR positive and HER2 Negative disease. -Her staging scans were negative for distant metastasis. -We previously discussed that HER-2 positive breast cancer are more aggressive, with a high risk for recurrence after surgery, especially in the setting of locally advanced stage.  -she completed adjuvant chemotherapy TCHP (docetaxel, carboplatin, Herceptin, pejeta) every 3 weeks for total of 6 cycles, followed by Herceptin maintenance therapy to complete 1 year treatment. -She underwent adjuvant radiation therapy by Dr. Sondra Come 03/07/16-04/27/2016. Tolerated well. -She started adjuvant antiestrogen therapy with tamoxifen on 05/21/16, tolerating well overall, with  moderate hot flashes that have now resolved. -The benefit of ovarian suppression is less clear in HER2 positive disease. -she started Neratinib on 11/09/2016 with main side effect of moderate diarrhea, which has much improved lately. She has been using Imodium, not needing Lomotil. Plan to complete 11/09/2017 -She had breast reconstruction by Dr. Marla Roe on 03/11/17. She is healing well and plans to return for another surgery early next year.  -She is clinically doing well. Lab reviewed, her CBC show mild anemia with Hg at 11.3 and CMP pending. Her breast exam was unremarkable. She had her implant removed 6 weeks ago and it is healing well. There is no clinical concern for recurrence. -She is scheduled for reconstruction surgery on 08/08/17 with Dr. Marla Roe. She opted for reconstruction with muscle flap She  had an infection 6 weeks ago and had her implants removed. She completed oral antibiotics.  -Continue breast cancer surveillance. Next mammogram in 09/2017. She is tolerating Tamoxifen and Nerlynx well, she will continue. -she will complete one year adjuvant Nerlynix in July 2019  -she has not had menstrual period since July 2018, I will check her [facility] level on next visit, to see if she is truly postmenopausal.  I will switch her tamoxifen to aromatase inhibitor if she is postmenopausal. -f/u in 4 months      2. Iron deficient anemia, and anemia of chronic disease  -Her lab tests showed a mild to moderate anemia. -Her anemia previously resolved after IV Feraheme, slightly worse after she started chemotherapy. -I previously encouraged her to continue oral iron pill -Her repeated iron level and a ferritin normalized.  -She has required blood transfusion in the past - Her menstrual cycle has returned. I previously encouraged her to take her iron pills twice daily for the next 3 months then once daily. -Her menses have been very heavy, she has been taking Iron supplements.  -12/07/16 iron studies  showed normal ferritin, low TIBC, consistent with anemia of chronic disease    3. Genetic -Given her young age and positive family history, we strongly recommend her to [facility] genetic counseling for genetic testing to ruled out inheritable breast cancer syndrome. She agrees. -Her genetic testing was normal  4.  HTN -She was noticed to have significant elevated blood pressure previously in clinic -She has previously seen her primary care physician, and her blood pressure medication has been adjusted, including HCTZ  5. Right arm lymphedema  -Secondary to breast surgery and lymph node biopsy -improved  -She is using compression cast for her right arm. -She has a pump from PT and she says it swells more on the pump, will F/u with PT for more help -Continue with compression sleeves and manual pump.   6. Hot flashes and mood swings. -The Effexor made her feel loopy and she has stopped taking it. -I previously started her on Neurontin for her nocturnal hot flash and peripheral neuropathy, but she only took it once due to feeling loopy. -Hot flashes have now resolved -Due to her depression and low appetite, I previously recommend her to try mirtazapine, will start her on 15 mg at bedtime.  Potential side effects and benefits discussed, she is willing to proceed. -Her hot flashes are tolerable, she is able to sleep  7. Diarrhea -Related to Neratinib. I previously advised her to try Lomotil. I prescribed -controlled to one diarrhea episode a day with 4 lomotil daily.  -she uses imodium daily   8. Low appetite, weight loss -Given her recent weight loss due to no appetite and overall down mood, I previously recommend low to moderate dose Mirtazapine. I discussed the side effects with her and she agreed to try it so I called it on 04/04/17  -She states mirtazapine made her sleepy, I advised her she can cut it in half and try it to help stimulate her appetite. -She has ost 5 lbs since last visit in  Oct 2018, I encouraged her to use some protein supplement and to increase her carbs if she can.    Plan -ConinueTamoxifen and Nerlynx -Plan to complete Nerlynx 11/09/2017 -Try 7.5 mg Mirtazapine, add protein supplement and increase carbs if she can, for weight gain  -Breast Reconstruction surgery with Dr. Marla Roe on 08/08/17 -Mammogram in June 2019 -Lab and f/u in 4 months, will  check [place] on next visit    I spent 20 minutes counseling the patient face to face. The total time spent in the appointment was 25 minutes and more than 50% was on counseling.   This document serves as a record of services personally performed by Truitt Merle, MD. It was created on her behalf by Theresia Bough, a trained medical scribe. The creation of this record is based on the scribe's personal observations and the provider's statements to them.   I have reviewed the above documentation for accuracy and completeness, and I agree with the above.    Truitt Merle, MD 08/02/2017

## 2017-08-01 ENCOUNTER — Encounter (HOSPITAL_COMMUNITY): Payer: Self-pay

## 2017-08-01 ENCOUNTER — Other Ambulatory Visit: Payer: Self-pay

## 2017-08-01 ENCOUNTER — Encounter (HOSPITAL_COMMUNITY)
Admission: RE | Admit: 2017-08-01 | Discharge: 2017-08-01 | Disposition: A | Discharge status: Home / Self Care | Payer: BLUE CROSS/BLUE SHIELD | Source: Ambulatory Visit | Attending: Plastic Surgery | Admitting: Plastic Surgery

## 2017-08-01 DIAGNOSIS — Z01812 Encounter for preprocedural laboratory examination: Secondary | ICD-10-CM | POA: Diagnosis present

## 2017-08-01 DIAGNOSIS — Z9011 Acquired absence of right breast and nipple: Secondary | ICD-10-CM | POA: Diagnosis not present

## 2017-08-01 HISTORY — DX: Lymphedema, not elsewhere classified: I89.0

## 2017-08-01 LAB — BASIC METABOLIC PANEL
Anion gap: 10 (ref 5–15)
BUN: 14 mg/dL (ref 6–20)
CO2: 24 mmol/L (ref 22–32)
Calcium: 9.3 mg/dL (ref 8.9–10.3)
Chloride: 106 mmol/L (ref 101–111)
Creatinine, Ser: 0.97 mg/dL (ref 0.44–1.00)
GFR calc Af Amer: >60 mL/min (ref >60)
GFR calc non Af Amer: >60 mL/min (ref >60)
Glucose, Bld: 94 mg/dL (ref 65–99)
Potassium: 3.7 mmol/L (ref 3.5–5.1)
Sodium: 140 mmol/L (ref 135–145)

## 2017-08-01 LAB — CBC
HCT: 35.9 % — ABNORMAL LOW (ref 36.0–46.0)
Hemoglobin: 11.5 g/dL — ABNORMAL LOW (ref 12.0–15.0)
MCH: 29.9 pg (ref 26.0–34.0)
MCHC: 32 g/dL (ref 30.0–36.0)
MCV: 93.5 fL (ref 78.0–100.0)
Platelets: 224 10*3/uL (ref 150–400)
RBC: 3.84 MIL/uL — ABNORMAL LOW (ref 3.87–5.11)
RDW: 13.4 % (ref 11.5–15.5)
WBC: 6.3 10*3/uL (ref 4.0–10.5)

## 2017-08-01 NOTE — Progress Notes (Signed)
PCP: Dr. Criss Rosales Cardiologist: Dr. Hermina Barters saw to establish care as related to oncology treatment  EKG: 06-01-17 ECHO: 10-22-16 Stress Test: denies Cardiac Cath: Denies  Patient denies shortness of breath, fever, cough, and chest pain at PAT appointment.  Patient verbalized understanding of instructions provided today at the PAT appointment.  Patient asked to review instructions at home and day of surgery.   Urine preg DOS

## 2017-08-02 ENCOUNTER — Encounter: Payer: Self-pay | Admitting: Hematology

## 2017-08-02 ENCOUNTER — Other Ambulatory Visit: Payer: BLUE CROSS/BLUE SHIELD

## 2017-08-02 ENCOUNTER — Inpatient Hospital Stay: Payer: BLUE CROSS/BLUE SHIELD | Attending: Hematology

## 2017-08-02 ENCOUNTER — Inpatient Hospital Stay (HOSPITAL_BASED_OUTPATIENT_CLINIC_OR_DEPARTMENT_OTHER): Payer: BLUE CROSS/BLUE SHIELD | Admitting: Hematology

## 2017-08-02 ENCOUNTER — Ambulatory Visit: Payer: BLUE CROSS/BLUE SHIELD | Admitting: Hematology

## 2017-08-02 ENCOUNTER — Telehealth: Payer: Self-pay | Admitting: Hematology

## 2017-08-02 VITALS — BP 126/80 | HR 82 | Temp 97.7°F | Resp 18 | Ht 63.0 in | Wt 129.4 lb

## 2017-08-02 DIAGNOSIS — Z170 Estrogen receptor positive status [ER+]: Secondary | ICD-10-CM

## 2017-08-02 DIAGNOSIS — D509 Iron deficiency anemia, unspecified: Secondary | ICD-10-CM | POA: Insufficient documentation

## 2017-08-02 DIAGNOSIS — I10 Essential (primary) hypertension: Secondary | ICD-10-CM

## 2017-08-02 DIAGNOSIS — Z79810 Long term (current) use of selective estrogen receptor modulators (SERMs): Secondary | ICD-10-CM

## 2017-08-02 DIAGNOSIS — C50511 Malignant neoplasm of lower-outer quadrant of right female breast: Secondary | ICD-10-CM

## 2017-08-02 DIAGNOSIS — R197 Diarrhea, unspecified: Secondary | ICD-10-CM | POA: Diagnosis not present

## 2017-08-02 DIAGNOSIS — R634 Abnormal weight loss: Secondary | ICD-10-CM

## 2017-08-02 DIAGNOSIS — I890 Lymphedema, not elsewhere classified: Secondary | ICD-10-CM | POA: Insufficient documentation

## 2017-08-02 DIAGNOSIS — D500 Iron deficiency anemia secondary to blood loss (chronic): Secondary | ICD-10-CM

## 2017-08-02 LAB — CBC WITH DIFFERENTIAL/PLATELET
Basophils Absolute: 0 10*3/uL (ref 0.0–0.1)
Basophils Relative: 0 %
Eosinophils Absolute: 0.2 10*3/uL (ref 0.0–0.5)
Eosinophils Relative: 4 %
HCT: 34.8 % (ref 34.8–46.6)
Hemoglobin: 11.3 g/dL — ABNORMAL LOW (ref 11.6–15.9)
Lymphocytes Relative: 34 %
Lymphs Abs: 1.9 10*3/uL (ref 0.9–3.3)
MCH: 30.4 pg (ref 25.1–34.0)
MCHC: 32.5 g/dL (ref 31.5–36.0)
MCV: 93.5 fL (ref 79.5–101.0)
Monocytes Absolute: 0.3 10*3/uL (ref 0.1–0.9)
Monocytes Relative: 5 %
Neutro Abs: 3.2 10*3/uL (ref 1.5–6.5)
Neutrophils Relative %: 57 %
Platelets: 197 10*3/uL (ref 145–400)
RBC: 3.72 MIL/uL (ref 3.70–5.45)
RDW: 13.2 % (ref 11.2–14.5)
WBC: 5.6 10*3/uL (ref 3.9–10.3)

## 2017-08-02 LAB — COMPREHENSIVE METABOLIC PANEL
ALT: 27 U/L (ref 0–55)
AST: 26 U/L (ref 5–34)
Albumin: 3.9 g/dL (ref 3.5–5.0)
Alkaline Phosphatase: 49 U/L (ref 40–150)
Anion gap: 9 (ref 3–11)
BUN: 17 mg/dL (ref 7–26)
CO2: 28 mmol/L (ref 22–29)
Calcium: 9.6 mg/dL (ref 8.4–10.4)
Chloride: 106 mmol/L (ref 98–109)
Creatinine, Ser: 1.03 mg/dL (ref 0.60–1.10)
GFR calc Af Amer: >60 mL/min (ref >60)
GFR calc non Af Amer: >60 mL/min (ref >60)
Glucose, Bld: 94 mg/dL (ref 70–140)
Potassium: 3.2 mmol/L — ABNORMAL LOW (ref 3.5–5.1)
Sodium: 143 mmol/L (ref 136–145)
Total Bilirubin: 0.4 mg/dL (ref 0.2–1.2)
Total Protein: 7.3 g/dL (ref 6.4–8.3)

## 2017-08-02 LAB — IRON AND TIBC
Iron: 76 ug/dL (ref 41–142)
Saturation Ratios: 32 % (ref 21–57)
TIBC: 235 ug/dL — ABNORMAL LOW (ref 236–444)
UIBC: 159 ug/dL

## 2017-08-02 LAB — FERRITIN: Ferritin: 250 ng/mL (ref 9–269)

## 2017-08-02 MED ORDER — NERATINIB MALEATE 40 MG PO TABS: 240.0000 mg | ORAL_TABLET | Freq: Every day | ORAL | 5 refills | Status: DC

## 2017-08-02 NOTE — Telephone Encounter (Signed)
Gave patient avs report and appointments for August.  °

## 2017-08-02 NOTE — Telephone Encounter (Signed)
Patient will arrange annual mammo appointment.

## 2017-08-06 ENCOUNTER — Other Ambulatory Visit: Payer: Self-pay | Admitting: *Deleted

## 2017-08-06 ENCOUNTER — Telehealth: Payer: Self-pay | Admitting: *Deleted

## 2017-08-06 DIAGNOSIS — E876 Hypokalemia: Secondary | ICD-10-CM

## 2017-08-06 MED ORDER — POTASSIUM CHLORIDE CRYS ER 20 MEQ PO TBCR: 20.0000 meq | EXTENDED_RELEASE_TABLET | Freq: Every day | ORAL | 0 refills | Status: DC

## 2017-08-06 NOTE — Telephone Encounter (Signed)
Called pt and left message on voice mail requesting a call back to nurse.

## 2017-08-06 NOTE — Telephone Encounter (Signed)
-----   Message from Truitt Merle, MD sent at 08/03/2017 11:11 AM EDT ----- Please let her know that iron study was normal, her K was slightly low, please call in KCL 53meq daily for 7 days if she is not on, and eat K rich food. Thanks  Truitt Merle  08/03/2017

## 2017-08-06 NOTE — Telephone Encounter (Signed)
Spoke with pt and informed pt of lab results.  Instructed pt to start Kdur 20 mEq daily for 7 days as per Dr. Ernestina Penna instructions.  Recommended foods high in potassium for pt.  Pt voiced understanding.

## 2017-08-08 ENCOUNTER — Encounter (HOSPITAL_COMMUNITY): Payer: Self-pay | Admitting: Urology

## 2017-08-08 ENCOUNTER — Inpatient Hospital Stay (HOSPITAL_COMMUNITY): Payer: BLUE CROSS/BLUE SHIELD | Admitting: Certified Registered Nurse Anesthetist

## 2017-08-08 ENCOUNTER — Encounter (HOSPITAL_COMMUNITY)
Admission: RE | Disposition: A | Discharge status: Home / Self Care | Payer: Self-pay | Source: Ambulatory Visit | Attending: Plastic Surgery

## 2017-08-08 ENCOUNTER — Inpatient Hospital Stay (HOSPITAL_COMMUNITY)
Admission: RE | Admit: 2017-08-08 | Discharge: 2017-08-10 | DRG: 585 | Disposition: A | Discharge status: Home / Self Care | Payer: BLUE CROSS/BLUE SHIELD | Source: Ambulatory Visit | Attending: Plastic Surgery | Admitting: Plastic Surgery

## 2017-08-08 DIAGNOSIS — L905 Scar conditions and fibrosis of skin: Secondary | ICD-10-CM | POA: Diagnosis present

## 2017-08-08 DIAGNOSIS — E785 Hyperlipidemia, unspecified: Secondary | ICD-10-CM | POA: Diagnosis present

## 2017-08-08 DIAGNOSIS — D649 Anemia, unspecified: Secondary | ICD-10-CM | POA: Diagnosis present

## 2017-08-08 DIAGNOSIS — I10 Essential (primary) hypertension: Secondary | ICD-10-CM | POA: Diagnosis present

## 2017-08-08 DIAGNOSIS — Z853 Personal history of malignant neoplasm of breast: Secondary | ICD-10-CM

## 2017-08-08 DIAGNOSIS — Z79899 Other long term (current) drug therapy: Secondary | ICD-10-CM | POA: Diagnosis not present

## 2017-08-08 DIAGNOSIS — K219 Gastro-esophageal reflux disease without esophagitis: Secondary | ICD-10-CM | POA: Diagnosis present

## 2017-08-08 DIAGNOSIS — N650 Deformity of reconstructed breast: Principal | ICD-10-CM | POA: Diagnosis present

## 2017-08-08 DIAGNOSIS — Z923 Personal history of irradiation: Secondary | ICD-10-CM | POA: Diagnosis not present

## 2017-08-08 DIAGNOSIS — Z9011 Acquired absence of right breast and nipple: Secondary | ICD-10-CM | POA: Diagnosis not present

## 2017-08-08 DIAGNOSIS — Z9221 Personal history of antineoplastic chemotherapy: Secondary | ICD-10-CM

## 2017-08-08 DIAGNOSIS — F419 Anxiety disorder, unspecified: Secondary | ICD-10-CM | POA: Diagnosis present

## 2017-08-08 HISTORY — PX: BREAST RECONSTRUCTION: SHX9

## 2017-08-08 HISTORY — PX: LATISSIMUS FLAP TO BREAST: SHX5357

## 2017-08-08 LAB — POCT PREGNANCY, URINE: Preg Test, Ur: NEGATIVE

## 2017-08-08 SURGERY — RECONSTRUCTION, BREAST
Anesthesia: General | Site: Back | Laterality: Right

## 2017-08-08 MED ORDER — HYDROMORPHONE HCL 2 MG/ML IJ SOLN
INTRAMUSCULAR | Status: AC
  Administered 2017-08-08: 0.5 mg via INTRAVENOUS
  Filled 2017-08-08: qty 1

## 2017-08-08 MED ORDER — METOPROLOL TARTRATE 5 MG/5ML IV SOLN
INTRAVENOUS | Status: DC | PRN
  Administered 2017-08-08 (×2): 2 mg via INTRAVENOUS

## 2017-08-08 MED ORDER — PHENYLEPHRINE 40 MCG/ML (10ML) SYRINGE FOR IV PUSH (FOR BLOOD PRESSURE SUPPORT)
PREFILLED_SYRINGE | INTRAVENOUS | Status: AC
  Filled 2017-08-08: qty 10

## 2017-08-08 MED ORDER — ONDANSETRON 4 MG PO TBDP
4.0000 mg | ORAL_TABLET | Freq: Four times a day (QID) | ORAL | Status: DC | PRN
  Administered 2017-08-10: 4 mg via ORAL
  Filled 2017-08-08: qty 1

## 2017-08-08 MED ORDER — OXYCODONE HCL 5 MG PO TABS: 5.0000 mg | ORAL_TABLET | Freq: Once | ORAL | Status: DC | PRN

## 2017-08-08 MED ORDER — HYDROMORPHONE HCL 2 MG/ML IJ SOLN
0.3000 mg | INTRAMUSCULAR | Status: DC | PRN
  Administered 2017-08-08 (×4): 0.5 mg via INTRAVENOUS

## 2017-08-08 MED ORDER — ROCURONIUM BROMIDE 100 MG/10ML IV SOLN
INTRAVENOUS | Status: DC | PRN
  Administered 2017-08-08: 40 mg via INTRAVENOUS
  Administered 2017-08-08: 20 mg via INTRAVENOUS

## 2017-08-08 MED ORDER — FENTANYL CITRATE (PF) 250 MCG/5ML IJ SOLN
INTRAMUSCULAR | Status: DC | PRN
  Administered 2017-08-08: 100 ug via INTRAVENOUS
  Administered 2017-08-08 (×2): 50 ug via INTRAVENOUS
  Administered 2017-08-08: 100 ug via INTRAVENOUS
  Administered 2017-08-08 (×2): 50 ug via INTRAVENOUS

## 2017-08-08 MED ORDER — PROPOFOL 1000 MG/100ML IV EMUL
INTRAVENOUS | Status: AC
  Filled 2017-08-08: qty 100

## 2017-08-08 MED ORDER — CEFAZOLIN SODIUM-DEXTROSE 2-4 GM/100ML-% IV SOLN
2.0000 g | INTRAVENOUS | Status: AC
  Administered 2017-08-08: 2 g via INTRAVENOUS

## 2017-08-08 MED ORDER — PHENYLEPHRINE HCL 10 MG/ML IJ SOLN
INTRAVENOUS | Status: DC | PRN
  Administered 2017-08-08: 25 ug/min via INTRAVENOUS

## 2017-08-08 MED ORDER — PHENYLEPHRINE HCL 10 MG/ML IJ SOLN
INTRAMUSCULAR | Status: DC | PRN
  Administered 2017-08-08 (×4): 80 ug via INTRAVENOUS

## 2017-08-08 MED ORDER — GLYCOPYRROLATE 0.2 MG/ML IJ SOLN
INTRAMUSCULAR | Status: DC | PRN
  Administered 2017-08-08: 0.2 mg via INTRAVENOUS

## 2017-08-08 MED ORDER — ROCURONIUM BROMIDE 10 MG/ML (PF) SYRINGE
PREFILLED_SYRINGE | INTRAVENOUS | Status: AC
  Filled 2017-08-08: qty 5

## 2017-08-08 MED ORDER — DIPHENHYDRAMINE HCL 12.5 MG/5ML PO ELIX: 12.5000 mg | ORAL_SOLUTION | Freq: Four times a day (QID) | ORAL | Status: DC | PRN

## 2017-08-08 MED ORDER — DEXAMETHASONE SODIUM PHOSPHATE 10 MG/ML IJ SOLN
INTRAMUSCULAR | Status: DC | PRN
  Administered 2017-08-08: 10 mg via INTRAVENOUS

## 2017-08-08 MED ORDER — FENTANYL CITRATE (PF) 250 MCG/5ML IJ SOLN
INTRAMUSCULAR | Status: AC
  Filled 2017-08-08: qty 5

## 2017-08-08 MED ORDER — BUPIVACAINE-EPINEPHRINE (PF) 0.25% -1:200000 IJ SOLN
INTRAMUSCULAR | Status: AC
  Filled 2017-08-08: qty 30

## 2017-08-08 MED ORDER — PROPOFOL 500 MG/50ML IV EMUL
INTRAVENOUS | Status: DC | PRN
  Administered 2017-08-08: 25 ug/kg/min via INTRAVENOUS

## 2017-08-08 MED ORDER — MIDAZOLAM HCL 2 MG/2ML IJ SOLN
INTRAMUSCULAR | Status: AC
  Filled 2017-08-08: qty 2

## 2017-08-08 MED ORDER — ONDANSETRON HCL 4 MG/2ML IJ SOLN: 4.0000 mg | Freq: Four times a day (QID) | INTRAMUSCULAR | Status: DC | PRN

## 2017-08-08 MED ORDER — OXYCODONE HCL ER 15 MG PO T12A
15.0000 mg | EXTENDED_RELEASE_TABLET | Freq: Two times a day (BID) | ORAL | Status: DC
  Administered 2017-08-08 – 2017-08-10 (×4): 15 mg via ORAL
  Filled 2017-08-08 (×5): qty 1

## 2017-08-08 MED ORDER — LACTATED RINGERS IV SOLN
INTRAVENOUS | Status: DC
  Administered 2017-08-08 (×3): via INTRAVENOUS

## 2017-08-08 MED ORDER — HYDROMORPHONE HCL 2 MG/ML IJ SOLN: 0.3000 mg | INTRAMUSCULAR | Status: DC | PRN

## 2017-08-08 MED ORDER — PROPOFOL 10 MG/ML IV BOLUS
INTRAVENOUS | Status: DC | PRN
  Administered 2017-08-08: 150 mg via INTRAVENOUS

## 2017-08-08 MED ORDER — DIPHENHYDRAMINE HCL 50 MG/ML IJ SOLN: 12.5000 mg | Freq: Four times a day (QID) | INTRAMUSCULAR | Status: DC | PRN

## 2017-08-08 MED ORDER — ACETAMINOPHEN 325 MG PO TABS
325.0000 mg | ORAL_TABLET | Freq: Four times a day (QID) | ORAL | Status: DC
  Administered 2017-08-08 – 2017-08-10 (×7): 325 mg via ORAL
  Filled 2017-08-08 (×7): qty 1

## 2017-08-08 MED ORDER — EVICEL 5 ML EX KIT
PACK | CUTANEOUS | Status: DC | PRN
  Administered 2017-08-08: 1

## 2017-08-08 MED ORDER — ONDANSETRON HCL 4 MG/2ML IJ SOLN
INTRAMUSCULAR | Status: DC | PRN
  Administered 2017-08-08: 4 mg via INTRAVENOUS

## 2017-08-08 MED ORDER — ALBUMIN HUMAN 5 % IV SOLN
INTRAVENOUS | Status: DC | PRN
  Administered 2017-08-08: 14:00:00 via INTRAVENOUS

## 2017-08-08 MED ORDER — CEFAZOLIN SODIUM-DEXTROSE 2-4 GM/100ML-% IV SOLN
2.0000 g | Freq: Three times a day (TID) | INTRAVENOUS | Status: DC
  Administered 2017-08-08 – 2017-08-10 (×5): 2 g via INTRAVENOUS
  Filled 2017-08-08 (×6): qty 100

## 2017-08-08 MED ORDER — SUGAMMADEX SODIUM 200 MG/2ML IV SOLN
INTRAVENOUS | Status: AC
  Filled 2017-08-08: qty 2

## 2017-08-08 MED ORDER — ARTIFICIAL TEARS OPHTHALMIC OINT
TOPICAL_OINTMENT | OPHTHALMIC | Status: AC
  Filled 2017-08-08: qty 3.5

## 2017-08-08 MED ORDER — NAPROXEN 250 MG PO TABS
500.0000 mg | ORAL_TABLET | Freq: Two times a day (BID) | ORAL | Status: DC | PRN
  Administered 2017-08-09: 500 mg via ORAL
  Filled 2017-08-08: qty 2

## 2017-08-08 MED ORDER — LIDOCAINE HCL (CARDIAC) PF 100 MG/5ML IV SOSY
PREFILLED_SYRINGE | INTRAVENOUS | Status: DC | PRN
  Administered 2017-08-08: 100 mg via INTRATRACHEAL

## 2017-08-08 MED ORDER — SODIUM CHLORIDE 0.9 % IV SOLN
INTRAVENOUS | Status: DC | PRN
  Administered 2017-08-08: 500 mL

## 2017-08-08 MED ORDER — KCL IN DEXTROSE-NACL 20-5-0.45 MEQ/L-%-% IV SOLN
INTRAVENOUS | Status: DC
  Administered 2017-08-08 – 2017-08-09 (×2): via INTRAVENOUS
  Filled 2017-08-08 (×3): qty 1000

## 2017-08-08 MED ORDER — SCOPOLAMINE 1 MG/3DAYS TD PT72
MEDICATED_PATCH | TRANSDERMAL | Status: DC | PRN
  Administered 2017-08-08: 1 via TRANSDERMAL

## 2017-08-08 MED ORDER — MIDAZOLAM HCL 2 MG/2ML IJ SOLN
INTRAMUSCULAR | Status: DC | PRN
  Administered 2017-08-08 (×2): 1 mg via INTRAVENOUS

## 2017-08-08 MED ORDER — DEXAMETHASONE SODIUM PHOSPHATE 10 MG/ML IJ SOLN
INTRAMUSCULAR | Status: AC
  Filled 2017-08-08: qty 1

## 2017-08-08 MED ORDER — OXYCODONE HCL 5 MG/5ML PO SOLN: 5.0000 mg | Freq: Once | ORAL | Status: DC | PRN

## 2017-08-08 MED ORDER — BUPIVACAINE-EPINEPHRINE 0.25% -1:200000 IJ SOLN
INTRAMUSCULAR | Status: DC | PRN
  Administered 2017-08-08: 5 mL

## 2017-08-08 MED ORDER — DIAZEPAM 2 MG PO TABS: 2.0000 mg | ORAL_TABLET | Freq: Two times a day (BID) | ORAL | Status: DC | PRN

## 2017-08-08 MED ORDER — ONDANSETRON HCL 4 MG/2ML IJ SOLN
4.0000 mg | Freq: Four times a day (QID) | INTRAMUSCULAR | Status: DC | PRN
  Filled 2017-08-08: qty 2

## 2017-08-08 MED ORDER — ONDANSETRON HCL 4 MG/2ML IJ SOLN
INTRAMUSCULAR | Status: AC
  Filled 2017-08-08: qty 2

## 2017-08-08 MED ORDER — PROPOFOL 10 MG/ML IV BOLUS
INTRAVENOUS | Status: AC
  Filled 2017-08-08: qty 20

## 2017-08-08 MED ORDER — SENNA 8.6 MG PO TABS
1.0000 | ORAL_TABLET | Freq: Two times a day (BID) | ORAL | Status: DC
  Administered 2017-08-08 – 2017-08-10 (×4): 8.6 mg via ORAL
  Filled 2017-08-08 (×4): qty 1

## 2017-08-08 MED ORDER — 0.9 % SODIUM CHLORIDE (POUR BTL) OPTIME
TOPICAL | Status: DC | PRN
  Administered 2017-08-08 (×2): 1000 mL

## 2017-08-08 MED ORDER — LIDOCAINE 2% (20 MG/ML) 5 ML SYRINGE
INTRAMUSCULAR | Status: AC
  Filled 2017-08-08: qty 5

## 2017-08-08 MED ORDER — POLYETHYLENE GLYCOL 3350 17 G PO PACK: 17.0000 g | PACK | Freq: Every day | ORAL | Status: DC | PRN

## 2017-08-08 MED ORDER — SUGAMMADEX SODIUM 200 MG/2ML IV SOLN
INTRAVENOUS | Status: DC | PRN
  Administered 2017-08-08: 120 mg via INTRAVENOUS

## 2017-08-08 MED ORDER — HYDROMORPHONE HCL 2 MG/ML IJ SOLN
1.0000 mg | INTRAMUSCULAR | Status: DC | PRN
  Administered 2017-08-09 (×2): 1 mg via INTRAVENOUS
  Filled 2017-08-08 (×2): qty 1

## 2017-08-08 MED ORDER — POLYMYXIN B SULFATE 500000 UNITS IJ SOLR
INTRAMUSCULAR | Status: AC
  Filled 2017-08-08: qty 500000

## 2017-08-08 MED ORDER — ARTIFICIAL TEARS OPHTHALMIC OINT
TOPICAL_OINTMENT | OPHTHALMIC | Status: DC | PRN
  Administered 2017-08-08: 1 via OPHTHALMIC

## 2017-08-08 SURGICAL SUPPLY — 89 items
ADH SKN CLS APL DERMABOND .7 (GAUZE/BANDAGES/DRESSINGS) ×4
APPLIER CLIP 9.375 MED OPEN (MISCELLANEOUS) ×3
APR CLP MED 9.3 20 MLT OPN (MISCELLANEOUS) ×2
BAG DECANTER FOR FLEXI CONT (MISCELLANEOUS) ×3 IMPLANT
BINDER BREAST LRG (GAUZE/BANDAGES/DRESSINGS) ×1 IMPLANT
BINDER BREAST XLRG (GAUZE/BANDAGES/DRESSINGS) IMPLANT
BIOPATCH RED 1 DISK 7.0 (GAUZE/BANDAGES/DRESSINGS) ×5 IMPLANT
BLADE 10 SAFETY STRL DISP (BLADE) ×3 IMPLANT
BLADE SURG 10 STRL SS (BLADE) ×3 IMPLANT
BLADE SURG 15 STRL LF DISP TIS (BLADE) ×2 IMPLANT
BLADE SURG 15 STRL SS (BLADE) ×3
BNDG COHESIVE 4X5 TAN STRL (GAUZE/BANDAGES/DRESSINGS) IMPLANT
CANISTER SUCT 3000ML PPV (MISCELLANEOUS) ×3 IMPLANT
CHLORAPREP W/TINT 26ML (MISCELLANEOUS) ×3 IMPLANT
CLIP APPLIE 9.375 MED OPEN (MISCELLANEOUS) ×2 IMPLANT
CONNECTOR 5 IN 1 STRAIGHT STRL (MISCELLANEOUS) ×2 IMPLANT
COVER SURGICAL LIGHT HANDLE (MISCELLANEOUS) ×3 IMPLANT
DECANTER SPIKE VIAL GLASS SM (MISCELLANEOUS) ×3 IMPLANT
DERMABOND ADVANCED (GAUZE/BANDAGES/DRESSINGS) ×2
DERMABOND ADVANCED .7 DNX12 (GAUZE/BANDAGES/DRESSINGS) ×2 IMPLANT
DRAIN CHANNEL 19F RND (DRAIN) ×7 IMPLANT
DRAPE HALF SHEET 40X57 (DRAPES) ×6 IMPLANT
DRAPE INCISE 23X17 IOBAN STRL (DRAPES)
DRAPE INCISE 23X17 STRL (DRAPES) IMPLANT
DRAPE INCISE IOBAN 23X17 STRL (DRAPES) IMPLANT
DRAPE INCISE IOBAN 85X60 (DRAPES) IMPLANT
DRAPE ORTHO SPLIT 77X108 STRL (DRAPES) ×12
DRAPE SURG 17X23 STRL (DRAPES) ×12 IMPLANT
DRAPE SURG ORHT 6 SPLT 77X108 (DRAPES) ×4 IMPLANT
DRAPE WARM FLUID 44X44 (DRAPE) ×3 IMPLANT
DRSG MEPILEX BORDER 4X8 (GAUZE/BANDAGES/DRESSINGS) ×3 IMPLANT
DRSG PAD ABDOMINAL 8X10 ST (GAUZE/BANDAGES/DRESSINGS) ×6 IMPLANT
ELECT BLADE 4.0 EZ CLEAN MEGAD (MISCELLANEOUS) ×3
ELECT BLADE 6.5 EXT (BLADE) IMPLANT
ELECT CAUTERY BLADE 6.4 (BLADE) ×8 IMPLANT
ELECT REM PT RETURN 9FT ADLT (ELECTROSURGICAL) ×3
ELECTRODE BLDE 4.0 EZ CLN MEGD (MISCELLANEOUS) ×2 IMPLANT
ELECTRODE REM PT RTRN 9FT ADLT (ELECTROSURGICAL) ×2 IMPLANT
EVACUATOR SILICONE 100CC (DRAIN) ×7 IMPLANT
GAUZE SPONGE 4X4 12PLY STRL (GAUZE/BANDAGES/DRESSINGS) ×4 IMPLANT
GAUZE SPONGE 4X4 12PLY STRL LF (GAUZE/BANDAGES/DRESSINGS) ×1 IMPLANT
GAUZE XEROFORM 5X9 LF (GAUZE/BANDAGES/DRESSINGS) ×2 IMPLANT
GLOVE BIO SURGEON STRL SZ 6.5 (GLOVE) ×8 IMPLANT
GOWN STRL REUS W/ TWL LRG LVL3 (GOWN DISPOSABLE) ×8 IMPLANT
GOWN STRL REUS W/TWL LRG LVL3 (GOWN DISPOSABLE) ×12
IMPL BREAST 300CC (Breast) IMPLANT
IMPLANT BREAST 300CC (Breast) ×3 IMPLANT
KIT BASIN OR (CUSTOM PROCEDURE TRAY) ×3 IMPLANT
KIT FILL SYSTEM UNIVERSAL (SET/KITS/TRAYS/PACK) ×1 IMPLANT
KIT TURNOVER KIT B (KITS) ×3 IMPLANT
NDL 21 GA WING INFUSION (NEEDLE) IMPLANT
NEEDLE 21 GA WING INFUSION (NEEDLE) ×3 IMPLANT
NEEDLE 22X1 1/2 (OR ONLY) (NEEDLE) ×3 IMPLANT
NS IRRIG 1000ML POUR BTL (IV SOLUTION) ×6 IMPLANT
PACK GENERAL/GYN (CUSTOM PROCEDURE TRAY) ×3 IMPLANT
PAD ABD 8X10 STRL (GAUZE/BANDAGES/DRESSINGS) ×1 IMPLANT
PAD ARMBOARD 7.5X6 YLW CONV (MISCELLANEOUS) ×9 IMPLANT
PENCIL BUTTON HOLSTER BLD 10FT (ELECTRODE) ×3 IMPLANT
PIN SAFETY STERILE (MISCELLANEOUS) ×3 IMPLANT
SET ASEPTIC TRANSFER (MISCELLANEOUS) IMPLANT
SPONGE LAP 18X18 X RAY DECT (DISPOSABLE) IMPLANT
STAPLER VISISTAT 35W (STAPLE) ×3 IMPLANT
STOCKINETTE IMPERVIOUS 9X36 MD (GAUZE/BANDAGES/DRESSINGS) ×1 IMPLANT
STOPCOCK 4 WAY LG BORE MALE ST (IV SETS) IMPLANT
STRIP CLOSURE SKIN 1/2X4 (GAUZE/BANDAGES/DRESSINGS) IMPLANT
SUT ETHILON 2 0 FS 18 (SUTURE) ×7 IMPLANT
SUT MNCRL AB 3-0 PS2 18 (SUTURE) ×11 IMPLANT
SUT MNCRL AB 4-0 PS2 18 (SUTURE) ×6 IMPLANT
SUT MON AB 5-0 PS2 18 (SUTURE) ×8 IMPLANT
SUT PDS AB 2-0 CT1 27 (SUTURE) IMPLANT
SUT PDS AB 3-0 SH 27 (SUTURE) ×1 IMPLANT
SUT SILK 3 0 SH 30 (SUTURE) ×2 IMPLANT
SUT SILK 4 0 PS 2 (SUTURE) ×3 IMPLANT
SUT VIC AB 3-0 PS2 18 (SUTURE)
SUT VIC AB 3-0 PS2 18XBRD (SUTURE) ×8 IMPLANT
SUT VIC AB 3-0 SH 27 (SUTURE)
SUT VIC AB 3-0 SH 27X BRD (SUTURE) ×6 IMPLANT
SUT VIC AB 3-0 SH 8-18 (SUTURE) ×3 IMPLANT
SUT VICRYL 4-0 PS2 18IN ABS (SUTURE) ×4 IMPLANT
SYR BULB IRRIGATION 50ML (SYRINGE) ×3 IMPLANT
SYR CONTROL 10ML LL (SYRINGE) ×3 IMPLANT
TOWEL GREEN STERILE (TOWEL DISPOSABLE) ×3 IMPLANT
TOWEL GREEN STERILE FF (TOWEL DISPOSABLE) ×3 IMPLANT
TOWEL OR 17X24 6PK STRL BLUE (TOWEL DISPOSABLE) ×3 IMPLANT
TOWEL OR 17X26 10 PK STRL BLUE (TOWEL DISPOSABLE) ×3 IMPLANT
TRAY CATH 16FR W/PLASTIC CATH (SET/KITS/TRAYS/PACK) ×1 IMPLANT
TRAY FOLEY W/METER SILVER 14FR (SET/KITS/TRAYS/PACK) IMPLANT
TUBE CONNECTING 12X1/4 (SUCTIONS) ×4 IMPLANT
YANKAUER SUCT BULB TIP NO VENT (SUCTIONS) ×3 IMPLANT

## 2017-08-08 NOTE — Transfer of Care (Signed)
Immediate Anesthesia Transfer of Care Note  Patient: JAYEL SCADUTO  Procedure(s) Performed: RIGHT BREAST RECONSTRUCTION WITH LATISSMA MYOCUTANEOUS FLAP AND EXPANDER PLACEMENT (Right Back) LATISSIMUS FLAP TO BREAST (Right )  Patient Location: PACU  Anesthesia Type:General  Level of Consciousness: awake, alert  and oriented  Airway & Oxygen Therapy: Patient Spontanous Breathing  Post-op Assessment: Report given to RN, Post -op Vital signs reviewed and stable and Patient moving all extremities X 4  Post vital signs: Reviewed and stable  Last Vitals:  Vitals Value Taken Time  BP 127/79 08/08/2017  4:41 PM  Temp 36.1 C 08/08/2017  4:39 PM  Pulse 71 08/08/2017  4:41 PM  Resp 14 08/08/2017  4:41 PM  SpO2 93 % 08/08/2017  4:41 PM  Vitals shown include unvalidated device data.  Last Pain:  Vitals:   08/08/17 0949  TempSrc:   PainSc: 0-No pain         Complications: No apparent anesthesia complications

## 2017-08-08 NOTE — H&P (Signed)
Melanie Moss is an 42 y.o. female.   Chief Complaint: right breast cancer / acquired absence HPI: The patient is a 42 yrs old bf here for right breast reconstruction.  She underwent a right mastectomy with expander and flex HD placement.  She was switched to an implant and had an opening of the skin.  She has healed and done well from the removal but has scaring to the chest wall.  The decision was made for reconstruction and she needs autologous tissue. No recent illnesses and is otherwise in good health.  Past Medical History:  Diagnosis Date  . Anemia   . Anxiety   . Breast cancer of lower-outer quadrant of right female breast ([place]) 07/29/2015  . Family history of breast cancer   . Family history of colon cancer   . Family history of prostate cancer   . GERD (gastroesophageal reflux disease)    tums   . History of radiation therapy 03/07/16-04/27/16   right chest wall 45 Gy in 25 fractions, right mastectomy scar 16 Gy in 8 fractions  . Hyperlipidemia   . Hypertension   . Lymphedema   . Personal history of chemotherapy   . Personal history of radiation therapy   . Preterm labor     Past Surgical History:  Procedure Laterality Date  . AXILLARY LYMPH NODE DISSECTION Right 09/29/2015   Procedure: AXILLARY LYMPH NODE DISSECTION;  Surgeon: Excell Seltzer, MD;  Location: [place];  Service: General;  Laterality: Right;  . BREAST IMPLANT REMOVAL Right 06/01/2017   Procedure: EXPLANT OF RIGHT 480 MENTOR BREAST IMPLANT;  Surgeon: Wallace Going, DO;  Location: [place];  Service: Plastics;  Laterality: Right;  . BREAST RECONSTRUCTION WITH PLACEMENT OF TISSUE EXPANDER AND FLEX HD (ACELLULAR HYDRATED DERMIS) Right 09/29/2015   Procedure: BREAST RECONSTRUCTION WITH PLACEMENT OF TISSUE EXPANDER AND FLEX HD (ACELLULAR HYDRATED DERMIS);  Surgeon: Loel Lofty Adlai Nieblas, DO;  Location: [place];  Service: Plastics;  Laterality: Right;  . CERVICAL CERCLAGE  02/19/2011   Procedure: CERCLAGE CERVICAL;  Surgeon:  Agnes Lawrence, MD;  Location: [facility];  Service: Gynecology;  Laterality: N/A;  . DILATION AND CURETTAGE OF UTERUS    . INCISION AND DRAINAGE OF WOUND Right 04/19/2017   Procedure: IRRIGATION OF RIGHT BREAST POCKET;  Surgeon: Wallace Going, DO;  Location: [place];  Service: Plastics;  Laterality: Right;  . MASTECTOMY     right 2017  . MASTECTOMY WITH AXILLARY LYMPH NODE DISSECTION Right 09/29/2015  . MASTOPEXY Left 03/11/2017   Procedure: MASTOPEXY FOR SYMENTRY;  Surgeon: Wallace Going, DO;  Location: [place];  Service: Plastics;  Laterality: Left;  . PORT-A-CATH REMOVAL  10/2016  . PORTACATH PLACEMENT N/A 09/29/2015   Procedure: INSERTION PORT-A-CATH;  Surgeon: Excell Seltzer, MD;  Location: [place];  Service: General;  Laterality: N/A;  . REMOVAL OF BILATERAL TISSUE EXPANDERS WITH PLACEMENT OF BILATERAL BREAST IMPLANTS Right 03/11/2017   Procedure: REMOVAL OF RIGHT TISSUE EXPANDERS WITH PLACEMRIGHT SILICONE  BREAST IMPLANTS;  Surgeon: Wallace Going, DO;  Location: [place];  Service: Plastics;  Laterality: Right;  . SIMPLE MASTECTOMY WITH AXILLARY SENTINEL NODE BIOPSY Right 09/29/2015   Procedure: RIGHT TOTAL  MASTECTOMY WITH AXILLARY SENTINEL NODE BIOPSY;  Surgeon: Excell Seltzer, MD;  Location: [place];  Service: General;  Laterality: Right;    Family History  Problem Relation Age of Onset  . Hypertension Mother   . Hypertension Father   . Prostate cancer  Father 35  . Breast cancer Maternal Aunt 5  . Breast cancer Paternal Aunt 42  . Cirrhosis Maternal Uncle   . Prostate cancer Paternal Uncle   . Throat cancer Maternal Aunt        non smoker  . Prostate cancer Paternal Uncle   . Colon cancer Cousin        maternal first cousin dx in his 89s  . Lung cancer Cousin        smoker   Social History:  reports that she has never smoked. She has never used smokeless tobacco. She reports that she does not  drink alcohol or use drugs.  Allergies: No Known Allergies  Medications Prior to Admission  Medication Sig Dispense Refill  . B Complex-C (SUPER B COMPLEX PO) Take 1 tablet by mouth daily.    . ferrous sulfate 325 (65 FE) MG tablet Take 325 mg by mouth daily.     . irbesartan-hydrochlorothiazide (AVALIDE) 150-12.5 MG tablet Take 1 tablet by mouth daily.    Marland Kitchen loperamide (IMODIUM A-D) 2 MG tablet Take 2 mg by mouth daily as needed for diarrhea or loose stools.    . naproxen sodium (ALEVE) 220 MG tablet Take 220 mg by mouth daily as needed (headaches).    . Neratinib Maleate (NERLYNX) 40 MG tablet Take 6 tablets (240 mg total) by mouth daily. Take with food. 180 tablet 5  . potassium chloride SA (K-DUR,KLOR-CON) 20 MEQ tablet Take 1 tablet (20 mEq total) by mouth daily. Take 1 tab ( 20 mEq ) daily  For  7  Days. 30 tablet 0  . Prenatal Vit-Fe Fumarate-FA (PRENATAL PO) Take 1 tablet by mouth daily. Iron free prenatal    . tamoxifen (NOLVADEX) 20 MG tablet Take 1 tablet (20 mg total) by mouth daily. 90 tablet 3  . diazepam (VALIUM) 2 MG tablet Take 2 mg by mouth 2 (two) times daily as needed for muscle spasms.     Marland Kitchen HYDROcodone-acetaminophen (NORCO) 5-325 MG tablet Take 1 tablet by mouth every 8 (eight) hours as needed for moderate pain. 20 tablet 0  . mirtazapine (REMERON) 15 MG tablet Take 1 tablet (15 mg total) by mouth at bedtime. (Patient not taking: Reported on 08/02/2017) 30 tablet 2  . ondansetron (ZOFRAN) 4 MG tablet Take 1 tablet (4 mg total) by mouth daily as needed for nausea or vomiting. (Patient not taking: Reported on 07/26/2017) 10 tablet 1    Results for orders placed or performed during the hospital encounter of 08/08/17 (from the past 48 hour(s))  Pregnancy, urine POC     Status: None   Collection Time: 08/08/17 10:03 AM  Result Value Ref Range   Preg Test, Ur NEGATIVE NEGATIVE    Comment:        THE SENSITIVITY OF THIS METHODOLOGY IS >24 mIU/mL    No results  found.  Review of Systems  Constitutional: Negative.   HENT: Negative.   Eyes: Negative.   Respiratory: Negative.   Cardiovascular: Negative.   Gastrointestinal: Negative.   Genitourinary: Negative.   Musculoskeletal: Negative.   Skin: Negative.   Neurological: Negative.   Psychiatric/Behavioral: Negative.     Blood pressure 137/84, pulse 80, temperature (!) 97.5 F (36.4 C), temperature source Oral, resp. rate 20, SpO2 100 %. Physical Exam  Constitutional: She is oriented to person, place, and time. She appears well-developed and well-nourished.  HENT:  Head: Normocephalic and atraumatic.  Eyes: Pupils are equal, round, and reactive to light. Conjunctivae and  EOM are normal.  Cardiovascular: Normal rate.  Respiratory: Effort normal.  GI: Soft. She exhibits no distension. There is no tenderness.  Neurological: She is alert and oriented to person, place, and time.  Skin: Skin is warm. No erythema.  Psychiatric: She has a normal mood and affect. Her behavior is normal. Judgment and thought content normal.     Assessment/Plan Plan for latissimus myocutaneous muscle flap for right breast reconstruction and expander placement.  [place], DO 08/08/2017, 12:04 PM

## 2017-08-08 NOTE — Anesthesia Postprocedure Evaluation (Signed)
Anesthesia Post Note  Patient: Melanie Moss  Procedure(s) Performed: RIGHT BREAST RECONSTRUCTION WITH LATISSMA MYOCUTANEOUS FLAP AND EXPANDER PLACEMENT (Right Back) LATISSIMUS FLAP TO BREAST (Right )     Patient location during evaluation: PACU Anesthesia Type: General Level of consciousness: awake and alert Pain management: pain level controlled Vital Signs Assessment: post-procedure vital signs reviewed and stable Respiratory status: spontaneous breathing, nonlabored ventilation, respiratory function stable and patient connected to nasal cannula oxygen Cardiovascular status: blood pressure returned to baseline and stable Postop Assessment: no apparent nausea or vomiting Anesthetic complications: no    Last Vitals:  Vitals:   08/08/17 1743 08/08/17 1745  BP: 138/86   Pulse: 65 66  Resp: (!) 33 14  Temp:    SpO2: 98% 99%    Last Pain:  Vitals:   08/08/17 1743  TempSrc:   PainSc: 5                  [name]

## 2017-08-08 NOTE — Anesthesia Preprocedure Evaluation (Signed)
Anesthesia Evaluation  Patient identified by MRN, date of birth, ID band Patient awake    Reviewed: Allergy & Precautions, NPO status , Patient's Chart, lab work & pertinent test results  History of Anesthesia Complications Negative for: history of anesthetic complications  Airway Mallampati: II  TM Distance: >3 FB Neck ROM: Full    Dental  (+) Teeth Intact, Dental Advisory Given   Pulmonary neg pulmonary ROS,    breath sounds clear to auscultation       Cardiovascular hypertension, Pt. on medications (-) angina Rhythm:Regular Rate:Normal     Neuro/Psych PSYCHIATRIC DISORDERS Anxiety negative neurological ROS     GI/Hepatic GERD  Controlled,  Endo/Other    Renal/GU      Musculoskeletal   Abdominal   Peds  Hematology   Anesthesia Other Findings   Reproductive/Obstetrics                             Anesthesia Physical Anesthesia Plan  ASA: II  Anesthesia Plan: General   Post-op Pain Management:    Induction: Intravenous  PONV Risk Score and Plan: 3 and Ondansetron, Dexamethasone and Scopolamine patch - Pre-op  Airway Management Planned: Oral ETT  Additional Equipment: None  Intra-op Plan:   Post-operative Plan: Extubation in OR  Informed Consent: I have reviewed the patients History and Physical, chart, labs and discussed the procedure including the risks, benefits and alternatives for the proposed anesthesia with the patient or authorized representative who has indicated his/her understanding and acceptance.   Dental advisory given  Plan Discussed with: CRNA and Surgeon  Anesthesia Plan Comments:         Anesthesia Quick Evaluation

## 2017-08-08 NOTE — Anesthesia Procedure Notes (Signed)
Procedure Name: Intubation Date/Time: 08/08/2017 12:59 PM Performed by: Lyndle Herrlich, MD Pre-anesthesia Checklist: Patient identified, Emergency Drugs available, Suction available and Patient being monitored Patient Re-evaluated:Patient Re-evaluated prior to induction Oxygen Delivery Method: Circle system utilized Preoxygenation: Pre-oxygenation with 100% oxygen Induction Type: IV induction Ventilation: Mask ventilation without difficulty Laryngoscope Size: Mac and 3 Grade View: Grade II Tube type: Oral Tube size: 7.0 mm Number of attempts: 1 Airway Equipment and Method: Stylet Placement Confirmation: ETT inserted through vocal cords under direct vision,  positive ETCO2 and breath sounds checked- equal and bilateral Secured at: 22 cm Tube secured with: Tape Dental Injury: Teeth and Oropharynx as per pre-operative assessment and Injury to lip

## 2017-08-08 NOTE — Op Note (Signed)
DATE OF OPERATION: 08/08/2017  LOCATION: [facility]  PREOPERATIVE DIAGNOSIS: Breast Cancer s/p right mastectomies   POSTOPERATIVE DIAGNOSIS: Same  PROCEDURE: 1. Latissimus myocutaneous flap  to reconstruct the right breast CPT 19361 2. Tissue expander placement CPT 19357 (70 cc of saline placed)  SURGEON: Neomi Laidler [place] Joselinne Lawal, DO  ASSISTANT: Shawn Rayburn, PA  EBL: 150 cc  SPECIMEN: None  DRAINS: three total 19 blake round drains  CONDITION: Stable  COMPLICATIONS: None  INDICATION: The patient, Melanie Moss, is a 42 y.o. female born on 04-04-1976, is here for treatment after a mastectomy.  She underwent a right mastectomy with expander and flex HD placement.  She then went to implant.  There were complications related to the radiation.  The implant was removed and the tissue has healed but is scared tight to the chest wall.    PROCEDURE DETAILS:  The patient was seen on the morning of her surgery and marked out for her flap.  She was given an IV and IV antibiotics. She was then taken to the operating room and given a general anesthetic. A standard time out was performed and all information was confirmed by those in the room. She has SCD's and a foley catheter placed. She was placed into the left lateral decubitus position with all key points padded. She was then prepped and draped in the standard sterile fashion using a chloroprep. The paddle design and position was confirmed. The procedure began by incising the margins of the paddle and dissecting out until all four margins of the muscle were identified. The muscle was then released inferiorly and anteriorly and care was taken not to pick up the serratus anteriorly or the paraspinous muscles posteriorly. The flap was then raised to the scapula and released and rotated medially. Care was taken to protect the vascular pedicle throughout this portion of the procedure. The paddle and muscle looked healthy  throughout the case.  The old mastectomy scar was excised and the flaps on top of the pectoralis muscle were raised.  The posterior and anterior pockets were then connected in the plane above the muscle. The muscle from the back and the skin paddle were then rotated into the chest pocket and covered with an ioban dressing. The flap pedicle was inspected and there was no tension. The back pocket was hemostased and Evicel placed. Two #19 blake round drains were place and secured with 3-0 Silk. The back incision was closed with buried 3-0 Vicryl, followed by 4-0 Monocryl and 5-0 Monocryl.  Dermabond and a protective dressing was applied.   The patient was then repositioned onto her back and the chest was prepped and draped with a betadine. The breast pocket was inspected and hemostases was achieved with electrocautery. The muscle was then secured superiorly to the pectoralis muscle with 3-0 Monocryl. A 300 cc expander was chosen. It was soaked in triple antibiotic solution and evacuated of air and then filled with 70 cc of sterile saline. The inferior portion of the muscle was tacked to the inframammary fold with 3-0 Vicryl.  One drain was placed on this side and secured with 4-0 Silk. The flap was then closed with 3-0 Monocryl deep, followed by 4-0 Monocryl and the skin closed with 5-0 Monocryl.  Dermabond, ABDs and a breast binder was applied.    The patient was allowed to wake up and taken to recovery room in stable condition at the end of the case. The family was notified at the end of  the case.

## 2017-08-09 ENCOUNTER — Other Ambulatory Visit: Payer: Self-pay

## 2017-08-09 ENCOUNTER — Encounter (HOSPITAL_COMMUNITY): Payer: Self-pay | Admitting: Plastic Surgery

## 2017-08-09 LAB — BASIC METABOLIC PANEL
Anion gap: 8 (ref 5–15)
BUN: 9 mg/dL (ref 6–20)
CO2: 23 mmol/L (ref 22–32)
Calcium: 8.3 mg/dL — ABNORMAL LOW (ref 8.9–10.3)
Chloride: 106 mmol/L (ref 101–111)
Creatinine, Ser: 0.94 mg/dL (ref 0.44–1.00)
GFR calc Af Amer: >60 mL/min (ref >60)
GFR calc non Af Amer: >60 mL/min (ref >60)
Glucose, Bld: 151 mg/dL — ABNORMAL HIGH (ref 65–99)
Potassium: 3.8 mmol/L (ref 3.5–5.1)
Sodium: 137 mmol/L (ref 135–145)

## 2017-08-09 LAB — CBC
HCT: 26.2 % — ABNORMAL LOW (ref 36.0–46.0)
Hemoglobin: 8.5 g/dL — ABNORMAL LOW (ref 12.0–15.0)
MCH: 30.5 pg (ref 26.0–34.0)
MCHC: 32.4 g/dL (ref 30.0–36.0)
MCV: 93.9 fL (ref 78.0–100.0)
Platelets: 187 10*3/uL (ref 150–400)
RBC: 2.79 MIL/uL — ABNORMAL LOW (ref 3.87–5.11)
RDW: 13.4 % (ref 11.5–15.5)
WBC: 11.2 10*3/uL — ABNORMAL HIGH (ref 4.0–10.5)

## 2017-08-09 LAB — GLUCOSE, CAPILLARY: Glucose-Capillary: 143 mg/dL — ABNORMAL HIGH (ref 65–99)

## 2017-08-09 NOTE — Progress Notes (Signed)
Subjective: In bed. No distress.  Feeling well after surgery yesterday.  Objective: Vital signs in last 24 hours: Temp:  [97 F (36.1 C)-98.5 F (36.9 C)] 98.5 F (36.9 C) (04/19 0627) Pulse Rate:  [46-91] 77 (04/19 0627) Resp:  [10-33] 16 (04/19 0627) BP: (114-143)/(74-93) 114/74 (04/19 0627) SpO2:  [94 %-100 %] 100 % (04/19 0627) Weight:  [64.2 kg (141 lb 8.6 oz)] 64.2 kg (141 lb 8.6 oz) (04/18 1803) Weight change:  Last BM Date: 08/08/17  Intake/Output from previous day: 04/18 0701 - 04/19 0700 In: 3631.7 [P.O.:240; I.V.:3041.7; IV Piggyback:350] Out: 9528 [Urine:1050; Drains:410; Blood:150] Intake/Output this shift: No intake/output data recorded.  General appearance: alert, cooperative and no distress Breasts: normal appearance, no masses or tenderness Incision/Wound: drains working.  Flap with good color and refill.  Lab Results: Recent Labs    08/09/17 0505  WBC 11.2*  HGB 8.5*  HCT 26.2*  PLT 187   BMET Recent Labs    08/09/17 0728  NA 137  K 3.8  CL 106  CO2 23  GLUCOSE 151*  BUN 9  CREATININE 0.94  CALCIUM 8.3*    Studies/Results: No results found.  Medications: I have reviewed the patient's current medications.  Assessment/Plan: Plan discharge for tomorrow.  Forms filled out if patient wants to leave early tomorrow morning. Follow up in clinic next week.  LOS: 1 day    Wallace Going 08/09/2017

## 2017-08-09 NOTE — Progress Notes (Signed)
1 Day Post-Op   Subjective/Chief Complaint: Did well overnight. Requiring some IV pain medication for back pain.  Right breast/LD flap viable and without signs of hematoma  JP drains x 3 serosanguinous drainage and volumes as expected. Back incision intact.    Objective: Vital signs in last 24 hours: Temp:  [97 F (36.1 C)-98.5 F (36.9 C)] 98.5 F (36.9 C) (04/19 0627) Pulse Rate:  [46-91] 77 (04/19 0627) Resp:  [10-33] 16 (04/19 0627) BP: (114-143)/(74-93) 114/74 (04/19 0627) SpO2:  [94 %-100 %] 100 % (04/19 0627) Weight:  [64.2 kg (141 lb 8.6 oz)] 64.2 kg (141 lb 8.6 oz) (04/18 1803) Last BM Date: 08/08/17  Intake/Output from previous day: 04/18 0701 - 04/19 0700 In: 3631.7 [P.O.:240; I.V.:3041.7; IV Piggyback:350] Out: 0370 [Urine:1050; Drains:410; Blood:150] Intake/Output this shift: No intake/output data recorded.    Lab Results:  Recent Labs    08/09/17 0505  WBC 11.2*  HGB 8.5*  HCT 26.2*  PLT 187   BMET Recent Labs    08/09/17 0728  NA 137  K 3.8  CL 106  CO2 23  GLUCOSE 151*  BUN 9  CREATININE 0.94  CALCIUM 8.3*   PT/INR No results for input(s): LABPROT, INR in the last 72 hours. ABG No results for input(s): PHART, HCO3 in the last 72 hours.  Invalid input(s): PCO2, PO2  Studies/Results: No results found.  Anti-infectives: Anti-infectives (From admission, onward)   Start     Dose/Rate Route Frequency Ordered Stop   08/08/17 2100  ceFAZolin (ANCEF) IVPB 2g/100 mL premix     2 g 200 mL/hr over 30 Minutes Intravenous Every 8 hours 08/08/17 1806     08/08/17 1323  polymyxin B 500,000 Units, bacitracin 50,000 Units in sodium chloride 0.9 % 500 mL irrigation  Status:  Discontinued       As needed 08/08/17 1323 08/08/17 1638   08/08/17 0936  ceFAZolin (ANCEF) IVPB 2g/100 mL premix     2 g 200 mL/hr over 30 Minutes Intravenous On call to O.R. 08/08/17 0936 08/08/17 1302      Assessment/Plan: s/p Procedure(s): RIGHT BREAST RECONSTRUCTION  WITH LATISSMA MYOCUTANEOUS FLAP AND EXPANDER PLACEMENT (Right) LATISSIMUS FLAP TO BREAST (Right) Mobilize OOB more today.   Discharge once more mobile and pain under control with po medications.   LOS: 1 day    [facility] [phone]

## 2017-08-09 NOTE — Discharge Summary (Signed)
Physician Discharge Summary  Patient ID: Melanie Moss MRN: [id number] DOB/AGE: 06-03-1975 42 y.o.  Admit date: 08/08/2017 Discharge date: 08/09/2017  Admission Diagnoses:  Discharge Diagnoses:  Active Problems:   Acquired absence of right breast   Discharged Condition: good  Hospital Course: The patient underwent a right latissimus myocutaneous flap for right breast reconstruction.  She had mild pain in the back as expected.  She was managed on the surgery unit postop.  She was eating, walking and had pain controlled with oral medication.  She will follow up in one week.  Consults: none  Significant Diagnostic Studies: none  Treatments: surgery  Discharge Exam: Blood pressure 114/74, pulse 77, temperature 98.5 F (36.9 C), temperature source Oral, resp. rate 16, height 5\' 3"  (1.6 m), weight 64.2 kg (141 lb 8.6 oz), SpO2 100 %. General appearance: alert, cooperative and no distress Incision/Wound: drains working.  Disposition:  Home with family and follow up in one week.   Follow-up Information    Dillingham, Loel Lofty, DO In 1 week.   Specialty:  Plastic Surgery Contact information: [place] [address] [phone]           Signed: Wallace Going 08/09/2017, 9:54 AM

## 2017-08-09 NOTE — Discharge Instructions (Signed)
No heavy lifting. Continue binder. Sink bath while drains in place.

## 2017-08-10 LAB — HIV ANTIBODY (ROUTINE TESTING W REFLEX): HIV Screen 4th Generation wRfx: NONREACTIVE

## 2017-08-10 NOTE — Progress Notes (Signed)
POD# 2 right LD flap, TE placement  No events overnight, feeling good.  Temp:  [98.2 F (36.8 C)-99.3 F (37.4 C)] 98.3 F (36.8 C) (04/20 0529) Pulse Rate:  [72-93] 80 (04/20 0529) Resp:  [16-17] 17 (04/20 0529) BP: (114-126)/(70-78) 114/76 (04/20 0529) SpO2:  [98 %-100 %] 98 % (04/20 0529)   JP back 80/50 breast 110  PE:  NAD Chest soft flat dry incisions intact Back flat incision cdi JPs serosanguinous  A/P Home today. Has all Rx at home. F/u arranged.  Irene Limbo, MD [facility] Plastic & Reconstructive Surgery [phone], pin [phone]

## 2017-08-12 NOTE — Op Note (Signed)
First Assist Op Note: [facility] MA[facility] OR I assisted the Surgeon(s) ______Dr. Lyndee Leo Dillingham___ on the procedure(s): _____Right breast latissimus dorsi myocutaneous flap reconstruction with tissue expander placement_______on Date ______4/18/2019___  I provided my assistance on this case as follows:  I was present and acted as first Environmental consultant during this operation. I was present during the patient transport into the operative suite and assisted the OR staff with transferring and positioning of the patient. All extremities were checked and properly cushioned and safety straps in place. I was involved in the prepping and placement of sterile drapes. A time out was performed and all information confirmed to be correct.  I first assisted during the case including retraction for exposure, assisting with closure of surgical wounds and application of sterile dressings. I provided assistance with application of post operative garments/splinting and assisted with patient transfer back to the stretcher as needed.   Mardee Clune,PA-C Plastic Surgery [phone]

## 2017-09-06 ENCOUNTER — Other Ambulatory Visit: Payer: Self-pay | Admitting: *Deleted

## 2017-09-06 DIAGNOSIS — Z170 Estrogen receptor positive status [ER+]: Principal | ICD-10-CM

## 2017-09-06 DIAGNOSIS — C50511 Malignant neoplasm of lower-outer quadrant of right female breast: Secondary | ICD-10-CM

## 2017-09-06 MED ORDER — NERATINIB MALEATE 40 MG PO TABS: 240.0000 mg | ORAL_TABLET | Freq: Every day | ORAL | 2 refills | Status: DC

## 2017-10-18 ENCOUNTER — Other Ambulatory Visit: Payer: Self-pay

## 2017-10-18 ENCOUNTER — Ambulatory Visit: Payer: Self-pay | Admitting: Plastic Surgery

## 2017-10-18 ENCOUNTER — Encounter (HOSPITAL_COMMUNITY): Payer: Self-pay | Admitting: *Deleted

## 2017-10-18 DIAGNOSIS — N6489 Other specified disorders of breast: Secondary | ICD-10-CM

## 2017-10-18 NOTE — Progress Notes (Signed)
Pt denies SOB, chest pain, and being under the care of a cardiologist. Pt denies having a stress test and cardiac cath. Pt denies having a chest x ray within the last year. Pt denies recent labs. Pt made aware to stop taking  Aspirin, vitamins, fish oil and herbal medications. Do not take any NSAIDs ie: Ibuprofen, Advil, Naproxen (Aleve), Motrin, BC and Goody Powder. Pt verbalized understanding of all pre-op instructions

## 2017-10-18 NOTE — H&P (Signed)
Melanie Moss is an 42 y.o. female.   Chief Complaint: right breast seroma HPI: The patient is a 42 y.o. yrs old bf here for history and physical for removal of her right breast expander.  She underwent a right mastectomy with expander placement June 2017.  She completed chemo several months later and then radiation by November 2017.  The implant was placed November 2018 but had issues and was removed February 2019.  She underwent a latissimus and was doing very well but had fluid collection and required antibiotics.  The fluid does not seem to be resolving.      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  She is 5 feet 3 inches tall, weights 133 pounds, preop bra = 34 DDD. She is BRCA negative.    Past Medical History:  Diagnosis Date  . Anemia   . Anxiety   . Breast cancer of lower-outer quadrant of right female breast ([place]) 07/29/2015  . Family history of breast cancer   . Family history of colon cancer   . Family history of prostate cancer   . GERD (gastroesophageal reflux disease)    tums   . History of radiation therapy 03/07/16-04/27/16   right chest wall 45 Gy in 25 fractions, right mastectomy scar 16 Gy in 8 fractions  . Hyperlipidemia   . Hypertension   . Lymphedema   . Personal history of chemotherapy   . Personal history of radiation therapy   . Preterm labor     Past Surgical History:  Procedure Laterality Date  . AXILLARY LYMPH NODE DISSECTION Right 09/29/2015   Procedure: AXILLARY LYMPH NODE DISSECTION;  Surgeon: Excell Seltzer, MD;  Location: [place];  Service: General;  Laterality: Right;  . BREAST IMPLANT REMOVAL Right 06/01/2017   Procedure: EXPLANT OF RIGHT 480 MENTOR BREAST IMPLANT;  Surgeon: Wallace Going, DO;  Location: [place];  Service: Plastics;  Laterality: Right;  . BREAST RECONSTRUCTION Right 08/08/2017   Procedure: RIGHT BREAST RECONSTRUCTION WITH LATISSMA MYOCUTANEOUS FLAP AND EXPANDER PLACEMENT;  Surgeon: Wallace Going, DO;  Location: [place];  Service: Plastics;  Laterality: Right;  . BREAST RECONSTRUCTION WITH PLACEMENT OF TISSUE EXPANDER AND FLEX HD (ACELLULAR HYDRATED DERMIS) Right 09/29/2015   Procedure: BREAST RECONSTRUCTION WITH PLACEMENT OF TISSUE EXPANDER AND FLEX HD (ACELLULAR HYDRATED DERMIS);  Surgeon: Loel Lofty Felita Bump, DO;  Location: [place];  Service: Plastics;  Laterality: Right;  . CERVICAL CERCLAGE  02/19/2011   Procedure: CERCLAGE CERVICAL;  Surgeon: Agnes Lawrence, MD;  Location: [facility];  Service: Gynecology;  Laterality: N/A;  . DILATION AND CURETTAGE OF UTERUS    . INCISION AND DRAINAGE OF WOUND Right 04/19/2017   Procedure: IRRIGATION OF RIGHT BREAST POCKET;  Surgeon: Wallace Going, DO;  Location: [place];  Service: Plastics;  Laterality: Right;  . LATISSIMUS FLAP TO BREAST Right 08/08/2017   Procedure: LATISSIMUS FLAP TO BREAST;  Surgeon: Wallace Going, DO;  Location: [place];  Service: Plastics;  Laterality: Right;  . MASTECTOMY     right 2017  . MASTECTOMY WITH AXILLARY LYMPH NODE DISSECTION Right 09/29/2015  . MASTOPEXY Left 03/11/2017   Procedure: MASTOPEXY FOR SYMENTRY;  Surgeon: Wallace Going, DO;  Location: [place];  Service: Plastics;  Laterality: Left;  .  PORT-A-CATH REMOVAL  10/2016  . PORTACATH PLACEMENT N/A 09/29/2015   Procedure: INSERTION PORT-A-CATH;  Surgeon: Excell Seltzer, MD;  Location: [place];  Service: General;  Laterality: N/A;  . REMOVAL OF BILATERAL TISSUE EXPANDERS WITH PLACEMENT OF BILATERAL BREAST IMPLANTS Right 03/11/2017   Procedure: REMOVAL OF RIGHT TISSUE EXPANDERS WITH PLACEMRIGHT SILICONE  BREAST IMPLANTS;  Surgeon: Wallace Going, DO;  Location: [place];  Service:  Plastics;  Laterality: Right;  . SIMPLE MASTECTOMY WITH AXILLARY SENTINEL NODE BIOPSY Right 09/29/2015   Procedure: RIGHT TOTAL  MASTECTOMY WITH AXILLARY SENTINEL NODE BIOPSY;  Surgeon: Excell Seltzer, MD;  Location: [place];  Service: General;  Laterality: Right;    Family History  Problem Relation Age of Onset  . Hypertension Mother   . Hypertension Father   . Prostate cancer Father 9  . Breast cancer Maternal Aunt 52  . Breast cancer Paternal Aunt 76  . Cirrhosis Maternal Uncle   . Prostate cancer Paternal Uncle   . Throat cancer Maternal Aunt        non smoker  . Prostate cancer Paternal Uncle   . Colon cancer Cousin        maternal first cousin dx in his 31s  . Lung cancer Cousin        smoker   Social History:  reports that she has never smoked. She has never used smokeless tobacco. She reports that she does not drink alcohol or use drugs.  Allergies: No Known Allergies   (Not in a hospital admission)  No results found for this or any previous visit (from the past 48 hour(s)). No results found.  Review of Systems  Constitutional: Negative.   HENT: Negative.   Eyes: Negative.   Respiratory: Negative.   Cardiovascular: Negative.   Gastrointestinal: Negative.   Genitourinary: Negative.   Musculoskeletal: Negative.   Skin: Negative.   Psychiatric/Behavioral: Negative.     There were no vitals taken for this visit. Physical Exam  Constitutional: She is oriented to person, place, and time. She appears well-developed and well-nourished.  HENT:  Head: Normocephalic and atraumatic.  Eyes: Pupils are equal, round, and reactive to light. EOM are normal.  Cardiovascular: Normal rate.  Respiratory: Effort normal.  Neurological: She is alert and oriented to person, place, and time.  Skin: Skin is warm.  Psychiatric: Her behavior is normal. Judgment and thought content normal.     Assessment/Plan Plan for removal of right expander.  Arrington,  DO 10/18/2017, 2:06 PM

## 2017-10-18 NOTE — H&P (View-Only) (Signed)
Melanie Moss is an 42 y.o. female.   Chief Complaint: right breast seroma HPI: The patient is a 42 y.o. yrs old bf here for history and physical for removal of her right breast expander.  She underwent a right mastectomy with expander placement June 2017.  She completed chemo several months later and then radiation by November 2017.  The implant was placed November 2018 but had issues and was removed February 2019.  She underwent a latissimus and was doing very well but had fluid collection and required antibiotics.  The fluid does not seem to be resolving.      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  She is 5 feet 3 inches tall, weights 133 pounds, preop bra = 34 DDD. She is BRCA negative.    Past Medical History:  Diagnosis Date  . Anemia   . Anxiety   . Breast cancer of lower-outer quadrant of right female breast ([place]) 07/29/2015  . Family history of breast cancer   . Family history of colon cancer   . Family history of prostate cancer   . GERD (gastroesophageal reflux disease)    tums   . History of radiation therapy 03/07/16-04/27/16   right chest wall 45 Gy in 25 fractions, right mastectomy scar 16 Gy in 8 fractions  . Hyperlipidemia   . Hypertension   . Lymphedema   . Personal history of chemotherapy   . Personal history of radiation therapy   . Preterm labor     Past Surgical History:  Procedure Laterality Date  . AXILLARY LYMPH NODE DISSECTION Right 09/29/2015   Procedure: AXILLARY LYMPH NODE DISSECTION;  Surgeon: Excell Seltzer, MD;  Location: [place];  Service: General;  Laterality: Right;  . BREAST IMPLANT REMOVAL Right 06/01/2017   Procedure: EXPLANT OF RIGHT 480 MENTOR BREAST IMPLANT;  Surgeon: Wallace Going, DO;  Location: [place];  Service: Plastics;  Laterality: Right;  . BREAST RECONSTRUCTION Right 08/08/2017   Procedure: RIGHT BREAST RECONSTRUCTION WITH LATISSMA MYOCUTANEOUS FLAP AND EXPANDER PLACEMENT;  Surgeon: Wallace Going, DO;  Location: [place];  Service: Plastics;  Laterality: Right;  . BREAST RECONSTRUCTION WITH PLACEMENT OF TISSUE EXPANDER AND FLEX HD (ACELLULAR HYDRATED DERMIS) Right 09/29/2015   Procedure: BREAST RECONSTRUCTION WITH PLACEMENT OF TISSUE EXPANDER AND FLEX HD (ACELLULAR HYDRATED DERMIS);  Surgeon: Loel Lofty Dillingham, DO;  Location: [place];  Service: Plastics;  Laterality: Right;  . CERVICAL CERCLAGE  02/19/2011   Procedure: CERCLAGE CERVICAL;  Surgeon: Agnes Lawrence, MD;  Location: [facility];  Service: Gynecology;  Laterality: N/A;  . DILATION AND CURETTAGE OF UTERUS    . INCISION AND DRAINAGE OF WOUND Right 04/19/2017   Procedure: IRRIGATION OF RIGHT BREAST POCKET;  Surgeon: Wallace Going, DO;  Location: [place];  Service: Plastics;  Laterality: Right;  . LATISSIMUS FLAP TO BREAST Right 08/08/2017   Procedure: LATISSIMUS FLAP TO BREAST;  Surgeon: Wallace Going, DO;  Location: [place];  Service: Plastics;  Laterality: Right;  . MASTECTOMY     right 2017  . MASTECTOMY WITH AXILLARY LYMPH NODE DISSECTION Right 09/29/2015  . MASTOPEXY Left 03/11/2017   Procedure: MASTOPEXY FOR SYMENTRY;  Surgeon: Wallace Going, DO;  Location: [place];  Service: Plastics;  Laterality: Left;  .  PORT-A-CATH REMOVAL  10/2016  . PORTACATH PLACEMENT N/A 09/29/2015   Procedure: INSERTION PORT-A-CATH;  Surgeon: Excell Seltzer, MD;  Location: [place];  Service: General;  Laterality: N/A;  . REMOVAL OF BILATERAL TISSUE EXPANDERS WITH PLACEMENT OF BILATERAL BREAST IMPLANTS Right 03/11/2017   Procedure: REMOVAL OF RIGHT TISSUE EXPANDERS WITH PLACEMRIGHT SILICONE  BREAST IMPLANTS;  Surgeon: Wallace Going, DO;  Location: [place];  Service:  Plastics;  Laterality: Right;  . SIMPLE MASTECTOMY WITH AXILLARY SENTINEL NODE BIOPSY Right 09/29/2015   Procedure: RIGHT TOTAL  MASTECTOMY WITH AXILLARY SENTINEL NODE BIOPSY;  Surgeon: Excell Seltzer, MD;  Location: [place];  Service: General;  Laterality: Right;    Family History  Problem Relation Age of Onset  . Hypertension Mother   . Hypertension Father   . Prostate cancer Father 9  . Breast cancer Maternal Aunt 52  . Breast cancer Paternal Aunt 76  . Cirrhosis Maternal Uncle   . Prostate cancer Paternal Uncle   . Throat cancer Maternal Aunt        non smoker  . Prostate cancer Paternal Uncle   . Colon cancer Cousin        maternal first cousin dx in his 31s  . Lung cancer Cousin        smoker   Social History:  reports that she has never smoked. She has never used smokeless tobacco. She reports that she does not drink alcohol or use drugs.  Allergies: No Known Allergies   (Not in a hospital admission)  No results found for this or any previous visit (from the past 48 hour(s)). No results found.  Review of Systems  Constitutional: Negative.   HENT: Negative.   Eyes: Negative.   Respiratory: Negative.   Cardiovascular: Negative.   Gastrointestinal: Negative.   Genitourinary: Negative.   Musculoskeletal: Negative.   Skin: Negative.   Psychiatric/Behavioral: Negative.     There were no vitals taken for this visit. Physical Exam  Constitutional: She is oriented to person, place, and time. She appears well-developed and well-nourished.  HENT:  Head: Normocephalic and atraumatic.  Eyes: Pupils are equal, round, and reactive to light. EOM are normal.  Cardiovascular: Normal rate.  Respiratory: Effort normal.  Neurological: She is alert and oriented to person, place, and time.  Skin: Skin is warm.  Psychiatric: Her behavior is normal. Judgment and thought content normal.     Assessment/Plan Plan for removal of right expander.  Arrington,  DO 10/18/2017, 2:06 PM

## 2017-10-19 ENCOUNTER — Ambulatory Visit (HOSPITAL_COMMUNITY)
Admission: RE | Admit: 2017-10-19 | Discharge: 2017-10-19 | Disposition: A | Discharge status: Home / Self Care | Payer: BLUE CROSS/BLUE SHIELD | Source: Ambulatory Visit | Attending: Plastic Surgery | Admitting: Plastic Surgery

## 2017-10-19 ENCOUNTER — Ambulatory Visit (HOSPITAL_COMMUNITY): Payer: BLUE CROSS/BLUE SHIELD | Admitting: Certified Registered"

## 2017-10-19 ENCOUNTER — Encounter (HOSPITAL_COMMUNITY)
Admission: RE | Disposition: A | Discharge status: Home / Self Care | Payer: Self-pay | Source: Ambulatory Visit | Attending: Plastic Surgery

## 2017-10-19 ENCOUNTER — Encounter (HOSPITAL_COMMUNITY): Payer: Self-pay | Admitting: *Deleted

## 2017-10-19 DIAGNOSIS — I10 Essential (primary) hypertension: Secondary | ICD-10-CM | POA: Diagnosis not present

## 2017-10-19 DIAGNOSIS — Z9221 Personal history of antineoplastic chemotherapy: Secondary | ICD-10-CM | POA: Insufficient documentation

## 2017-10-19 DIAGNOSIS — N6489 Other specified disorders of breast: Secondary | ICD-10-CM | POA: Diagnosis not present

## 2017-10-19 DIAGNOSIS — F419 Anxiety disorder, unspecified: Secondary | ICD-10-CM | POA: Diagnosis not present

## 2017-10-19 DIAGNOSIS — Z79899 Other long term (current) drug therapy: Secondary | ICD-10-CM | POA: Insufficient documentation

## 2017-10-19 DIAGNOSIS — E785 Hyperlipidemia, unspecified: Secondary | ICD-10-CM | POA: Diagnosis not present

## 2017-10-19 DIAGNOSIS — Z923 Personal history of irradiation: Secondary | ICD-10-CM | POA: Insufficient documentation

## 2017-10-19 DIAGNOSIS — K219 Gastro-esophageal reflux disease without esophagitis: Secondary | ICD-10-CM | POA: Diagnosis not present

## 2017-10-19 DIAGNOSIS — Z853 Personal history of malignant neoplasm of breast: Secondary | ICD-10-CM | POA: Diagnosis not present

## 2017-10-19 HISTORY — PX: REMOVAL OF TISSUE EXPANDER AND PLACEMENT OF IMPLANT: SHX6457

## 2017-10-19 LAB — BASIC METABOLIC PANEL
Anion gap: 8 (ref 5–15)
BUN: 14 mg/dL (ref 6–20)
CO2: 27 mmol/L (ref 22–32)
Calcium: 9.4 mg/dL (ref 8.9–10.3)
Chloride: 105 mmol/L (ref 98–111)
Creatinine, Ser: 1 mg/dL (ref 0.44–1.00)
GFR calc Af Amer: >60 mL/min (ref >60)
GFR calc non Af Amer: >60 mL/min (ref >60)
Glucose, Bld: 90 mg/dL (ref 70–99)
Potassium: 4.2 mmol/L (ref 3.5–5.1)
Sodium: 140 mmol/L (ref 135–145)

## 2017-10-19 LAB — CBC
HCT: 35.7 % — ABNORMAL LOW (ref 36.0–46.0)
Hemoglobin: 11.1 g/dL — ABNORMAL LOW (ref 12.0–15.0)
MCH: 29.8 pg (ref 26.0–34.0)
MCHC: 31.1 g/dL (ref 30.0–36.0)
MCV: 96 fL (ref 78.0–100.0)
Platelets: 300 10*3/uL (ref 150–400)
RBC: 3.72 MIL/uL — ABNORMAL LOW (ref 3.87–5.11)
RDW: 15 % (ref 11.5–15.5)
WBC: 6.6 10*3/uL (ref 4.0–10.5)

## 2017-10-19 LAB — POCT PREGNANCY, URINE: Preg Test, Ur: NEGATIVE

## 2017-10-19 SURGERY — REMOVAL, TISSUE EXPANDER, BREAST, WITH IMPLANT INSERTION
Anesthesia: General | Site: Breast | Laterality: Right

## 2017-10-19 MED ORDER — FENTANYL CITRATE (PF) 100 MCG/2ML IJ SOLN
25.0000 ug | INTRAMUSCULAR | Status: DC | PRN
  Administered 2017-10-19 (×2): 25 ug via INTRAVENOUS

## 2017-10-19 MED ORDER — CEFAZOLIN SODIUM-DEXTROSE 2-4 GM/100ML-% IV SOLN
2.0000 g | INTRAVENOUS | Status: AC
  Administered 2017-10-19: 2 g via INTRAVENOUS
  Filled 2017-10-19: qty 100

## 2017-10-19 MED ORDER — PROPOFOL 10 MG/ML IV BOLUS
INTRAVENOUS | Status: AC
  Filled 2017-10-19: qty 20

## 2017-10-19 MED ORDER — DEXAMETHASONE SODIUM PHOSPHATE 10 MG/ML IJ SOLN
INTRAMUSCULAR | Status: DC | PRN
  Administered 2017-10-19: 10 mg via INTRAVENOUS

## 2017-10-19 MED ORDER — SODIUM CHLORIDE 0.9% FLUSH: 3.0000 mL | INTRAVENOUS | Status: DC | PRN

## 2017-10-19 MED ORDER — VANCOMYCIN HCL IN DEXTROSE 1-5 GM/200ML-% IV SOLN
1000.0000 mg | INTRAVENOUS | Status: AC
  Administered 2017-10-19: 1000 mg via INTRAVENOUS
  Filled 2017-10-19: qty 200

## 2017-10-19 MED ORDER — BUPIVACAINE-EPINEPHRINE 0.25% -1:200000 IJ SOLN
INTRAMUSCULAR | Status: DC | PRN
  Administered 2017-10-19: 15 mL

## 2017-10-19 MED ORDER — OXYCODONE HCL 5 MG PO TABS
ORAL_TABLET | ORAL | Status: DC
Start: 2017-10-19 — End: 2017-10-19
  Filled 2017-10-19: qty 1

## 2017-10-19 MED ORDER — PROMETHAZINE HCL 25 MG/ML IJ SOLN
6.2500 mg | INTRAMUSCULAR | Status: DC | PRN
  Administered 2017-10-19: 12.5 mg via INTRAVENOUS

## 2017-10-19 MED ORDER — GLYCOPYRROLATE PF 0.2 MG/ML IJ SOSY
PREFILLED_SYRINGE | INTRAMUSCULAR | Status: DC | PRN
  Administered 2017-10-19 (×2): .1 mg via INTRAVENOUS

## 2017-10-19 MED ORDER — ONDANSETRON HCL 4 MG/2ML IJ SOLN
INTRAMUSCULAR | Status: DC | PRN
  Administered 2017-10-19: 4 mg via INTRAVENOUS

## 2017-10-19 MED ORDER — METOCLOPRAMIDE HCL 5 MG/ML IJ SOLN
INTRAMUSCULAR | Status: DC | PRN
  Administered 2017-10-19: 10 mg via INTRAVENOUS

## 2017-10-19 MED ORDER — FENTANYL CITRATE (PF) 100 MCG/2ML IJ SOLN
INTRAMUSCULAR | Status: AC
  Filled 2017-10-19: qty 2

## 2017-10-19 MED ORDER — PROPOFOL 10 MG/ML IV BOLUS
INTRAVENOUS | Status: DC | PRN
  Administered 2017-10-19: 20 mg via INTRAVENOUS
  Administered 2017-10-19: 130 mg via INTRAVENOUS

## 2017-10-19 MED ORDER — MIDAZOLAM HCL 5 MG/5ML IJ SOLN
INTRAMUSCULAR | Status: DC | PRN
  Administered 2017-10-19 (×2): 1 mg via INTRAVENOUS

## 2017-10-19 MED ORDER — SODIUM CHLORIDE 0.9 % IV SOLN: 250.0000 mL | INTRAVENOUS | Status: DC | PRN

## 2017-10-19 MED ORDER — ACETAMINOPHEN 650 MG RE SUPP: 650.0000 mg | RECTAL | Status: DC | PRN

## 2017-10-19 MED ORDER — SODIUM CHLORIDE 0.9 % IV SOLN
INTRAVENOUS | Status: AC
  Filled 2017-10-19: qty 500000

## 2017-10-19 MED ORDER — MIDAZOLAM HCL 2 MG/2ML IJ SOLN
INTRAMUSCULAR | Status: AC
  Filled 2017-10-19: qty 2

## 2017-10-19 MED ORDER — OXYCODONE HCL 5 MG PO TABS: 5.0000 mg | ORAL_TABLET | ORAL | Status: DC | PRN

## 2017-10-19 MED ORDER — OXYCODONE HCL 5 MG PO TABS
5.0000 mg | ORAL_TABLET | Freq: Once | ORAL | Status: AC | PRN
  Administered 2017-10-19: 5 mg via ORAL

## 2017-10-19 MED ORDER — BUPIVACAINE-EPINEPHRINE (PF) 0.25% -1:200000 IJ SOLN
INTRAMUSCULAR | Status: AC
  Filled 2017-10-19: qty 30

## 2017-10-19 MED ORDER — FENTANYL CITRATE (PF) 250 MCG/5ML IJ SOLN
INTRAMUSCULAR | Status: AC
  Filled 2017-10-19: qty 5

## 2017-10-19 MED ORDER — PROMETHAZINE HCL 25 MG/ML IJ SOLN
INTRAMUSCULAR | Status: AC
  Filled 2017-10-19: qty 1

## 2017-10-19 MED ORDER — SCOPOLAMINE 1 MG/3DAYS TD PT72
MEDICATED_PATCH | TRANSDERMAL | Status: DC | PRN
  Administered 2017-10-19: 1 via TRANSDERMAL

## 2017-10-19 MED ORDER — LIDOCAINE 2% (20 MG/ML) 5 ML SYRINGE
INTRAMUSCULAR | Status: DC | PRN
  Administered 2017-10-19: 40 mg via INTRAVENOUS

## 2017-10-19 MED ORDER — FENTANYL CITRATE (PF) 250 MCG/5ML IJ SOLN
INTRAMUSCULAR | Status: DC | PRN
  Administered 2017-10-19 (×3): 25 ug via INTRAVENOUS
  Administered 2017-10-19 (×3): 50 ug via INTRAVENOUS
  Administered 2017-10-19: 25 ug via INTRAVENOUS

## 2017-10-19 MED ORDER — ACETAMINOPHEN 325 MG PO TABS
650.0000 mg | ORAL_TABLET | ORAL | Status: DC | PRN
Start: 2017-10-19 — End: 2017-10-19

## 2017-10-19 MED ORDER — SODIUM CHLORIDE 0.9% FLUSH: 3.0000 mL | Freq: Two times a day (BID) | INTRAVENOUS | Status: DC

## 2017-10-19 MED ORDER — PHENYLEPHRINE HCL 10 MG/ML IJ SOLN
INTRAMUSCULAR | Status: DC | PRN
  Administered 2017-10-19 (×4): 80 ug via INTRAVENOUS

## 2017-10-19 MED ORDER — POLYMYXIN B SULFATE 500000 UNITS IJ SOLR
INTRAMUSCULAR | Status: DC | PRN
  Administered 2017-10-19: 500 mL

## 2017-10-19 MED ORDER — LACTATED RINGERS IV SOLN
INTRAVENOUS | Status: DC
  Administered 2017-10-19: 07:00:00 via INTRAVENOUS

## 2017-10-19 MED ORDER — OXYCODONE HCL 5 MG/5ML PO SOLN: 5.0000 mg | Freq: Once | ORAL | Status: AC | PRN

## 2017-10-19 MED ORDER — 0.9 % SODIUM CHLORIDE (POUR BTL) OPTIME
TOPICAL | Status: DC | PRN
  Administered 2017-10-19: 1000 mL

## 2017-10-19 SURGICAL SUPPLY — 47 items
ADH SKN CLS APL DERMABOND .7 (GAUZE/BANDAGES/DRESSINGS) ×1
BANDAGE ACE 6X5 VEL STRL LF (GAUZE/BANDAGES/DRESSINGS) IMPLANT
BINDER BREAST MEDIUM (GAUZE/BANDAGES/DRESSINGS) ×1 IMPLANT
BIOPATCH RED 1 DISK 7.0 (GAUZE/BANDAGES/DRESSINGS) ×4 IMPLANT
BLADE 10 SAFETY STRL DISP (BLADE) ×1 IMPLANT
CANISTER SUCT 3000ML PPV (MISCELLANEOUS) ×2 IMPLANT
CHLORAPREP W/TINT 26ML (MISCELLANEOUS) ×2 IMPLANT
COVER SURGICAL LIGHT HANDLE (MISCELLANEOUS) ×2 IMPLANT
DERMABOND ADVANCED (GAUZE/BANDAGES/DRESSINGS) ×1
DERMABOND ADVANCED .7 DNX12 (GAUZE/BANDAGES/DRESSINGS) IMPLANT
DRAIN CHANNEL 19F RND (DRAIN) IMPLANT
DRAIN WOUND SNY 15 RND (WOUND CARE) ×1 IMPLANT
DRAPE LAPAROSCOPIC ABDOMINAL (DRAPES) ×2 IMPLANT
DRSG PAD ABDOMINAL 8X10 ST (GAUZE/BANDAGES/DRESSINGS) IMPLANT
ELECT BLADE 4.0 EZ CLEAN MEGAD (MISCELLANEOUS) ×2
ELECT REM PT RETURN 9FT ADLT (ELECTROSURGICAL) ×2
ELECTRODE BLDE 4.0 EZ CLN MEGD (MISCELLANEOUS) IMPLANT
ELECTRODE REM PT RTRN 9FT ADLT (ELECTROSURGICAL) ×1 IMPLANT
EVACUATOR SILICONE 100CC (DRAIN) ×1 IMPLANT
GAUZE SPONGE 4X4 12PLY STRL (GAUZE/BANDAGES/DRESSINGS) IMPLANT
GAUZE SPONGE 4X4 12PLY STRL LF (GAUZE/BANDAGES/DRESSINGS) ×1 IMPLANT
GLOVE BIO SURGEON STRL SZ 6.5 (GLOVE) ×4 IMPLANT
GOWN STRL REUS W/ TWL LRG LVL3 (GOWN DISPOSABLE) ×3 IMPLANT
GOWN STRL REUS W/TWL LRG LVL3 (GOWN DISPOSABLE) ×4
KIT BASIN OR (CUSTOM PROCEDURE TRAY) ×2 IMPLANT
KIT TURNOVER KIT B (KITS) ×2 IMPLANT
MARKER SKIN DUAL TIP RULER LAB (MISCELLANEOUS) ×1 IMPLANT
NEEDLE 22X1 1/2 (OR ONLY) (NEEDLE) ×1 IMPLANT
NS IRRIG 1000ML POUR BTL (IV SOLUTION) ×2 IMPLANT
PACK GENERAL/GYN (CUSTOM PROCEDURE TRAY) ×2 IMPLANT
PAD ABD 8X10 STRL (GAUZE/BANDAGES/DRESSINGS) ×1 IMPLANT
PAD ARMBOARD 7.5X6 YLW CONV (MISCELLANEOUS) ×4 IMPLANT
STAPLER VISISTAT 35W (STAPLE) ×1 IMPLANT
SUT MNCRL AB 3-0 PS2 18 (SUTURE) ×4 IMPLANT
SUT MNCRL AB 4-0 PS2 18 (SUTURE) ×1 IMPLANT
SUT MON AB 3-0 SH 27 (SUTURE) ×2
SUT MON AB 3-0 SH27 (SUTURE) IMPLANT
SUT MON AB 5-0 PS2 18 (SUTURE) ×1 IMPLANT
SUT PROLENE 3 0 PS 2 (SUTURE) ×2 IMPLANT
SUT SILK 3 0 SH 30 (SUTURE) ×1 IMPLANT
SUT VIC AB 3-0 SH 18 (SUTURE) ×2 IMPLANT
SWAB COLLECTION DEVICE MRSA (MISCELLANEOUS) ×1 IMPLANT
SWAB CULTURE ESWAB REG 1ML (MISCELLANEOUS) ×1 IMPLANT
SYR CONTROL 10ML LL (SYRINGE) ×1 IMPLANT
TOWEL OR 17X24 6PK STRL BLUE (TOWEL DISPOSABLE) ×2 IMPLANT
TOWEL OR 17X26 10 PK STRL BLUE (TOWEL DISPOSABLE) ×2 IMPLANT
WATER STERILE IRR 1000ML POUR (IV SOLUTION) IMPLANT

## 2017-10-19 NOTE — Interval H&P Note (Signed)
History and Physical Interval Note:  10/19/2017 6:55 AM  Melanie Moss  has presented today for surgery, with the diagnosis of Malignant neoplasm of lower-outer quadrant of right breast of female, estrogen receptor positive, Acquired absence of right breast and nipple  The various methods of treatment have been discussed with the patient and family. After consideration of risks, benefits and other options for treatment, the patient has consented to  Procedure(s): REMOVAL OF TISSUE EXPANDER (Right) as a surgical intervention .  The patient's history has been reviewed, patient examined, no change in status, stable for surgery.  I have reviewed the patient's chart and labs.  Questions were answered to the patient's satisfaction.     Loel Lofty Dillingham

## 2017-10-19 NOTE — Op Note (Signed)
DATE OF OPERATION: 10/19/2017  LOCATION: [facility] Operating Room  PREOPERATIVE DIAGNOSIS: Seroma of right breast  POSTOPERATIVE DIAGNOSIS: Same  PROCEDURE: Removal of right breast expander and seroma.  SURGEON: Karrie Fluellen Sanger Tifani Dack, DO  EBL: 10 cc  CONDITION: Stable  COMPLICATIONS: None  INDICATION: The patient, Melanie Moss, is a 42 y.o. female born on 02-22-1976, is here for treatment of a right breast seroma.   PROCEDURE DETAILS:  The patient was seen prior to surgery and marked.  The IV antibiotics were given. The patient was taken to the operating room and given a general anesthetic. A standard time out was performed and all information was confirmed by those in the room. SCDs were placed.   The chest was prepped and draped.  The local was injected at the inferior latissimus incision.  The #15 blade was used to make an incision.  The bovie was used to dissect through the fat over the muscle to the inframammary fold.  The muscle was then separated from the fold.  The expander was removed.  Cultures done of the fluid.  The pocket was irrigated with saline and antibiotic solution.  The bovie was used to obtain hemostasis.  There was some cloudy fluid noted.  A drain was placed and secured to the skin with a 3-0 Silk.  The latissimus muscle was secured to the inframammary fold with the 3-0 Monocryl.  The deep layers were then closed with the 4-0 Monocryl and the skin with the 5-0 Monocryl.  Derma bond was applied.  Sterile dressing applied and a breast binder.  The patient was allowed to wake up and taken to recovery room in stable condition at the end of the case. The family was notified at the end of the case.

## 2017-10-19 NOTE — Transfer of Care (Signed)
Immediate Anesthesia Transfer of Care Note  Patient: Melanie Moss  Procedure(s) Performed: REMOVAL OF TISSUE EXPANDER (Right Breast)  Patient Location: PACU  Anesthesia Type:General  Level of Consciousness: awake, alert , oriented and patient cooperative  Airway & Oxygen Therapy: Patient Spontanous Breathing and Patient connected to face mask oxygen  Post-op Assessment: Report given to RN and Post -op Vital signs reviewed and stable  Post vital signs: Reviewed and stable  Last Vitals:  Vitals Value Taken Time  BP 143/82 10/19/2017  8:56 AM  Temp 36.5 C 10/19/2017  8:54 AM  Pulse 103 10/19/2017  8:58 AM  Resp 16 10/19/2017  8:58 AM  SpO2 100 % 10/19/2017  8:58 AM  Vitals shown include unvalidated device data.  Last Pain:  Vitals:   10/19/17 5374  TempSrc: Oral  PainSc: 5       Patients Stated Pain Goal: 5 (82/70/78 6754)  Complications: No apparent anesthesia complications

## 2017-10-19 NOTE — Anesthesia Preprocedure Evaluation (Signed)
Anesthesia Evaluation  Patient identified by MRN, date of birth, ID band Patient awake    Reviewed: Allergy & Precautions, NPO status , Patient's Chart, lab work & pertinent test results  History of Anesthesia Complications Negative for: history of anesthetic complications  Airway Mallampati: II  TM Distance: >3 FB Neck ROM: Full    Dental  (+) Teeth Intact, Dental Advisory Given   Pulmonary neg pulmonary ROS,    breath sounds clear to auscultation       Cardiovascular hypertension, Pt. on medications (-) angina Rhythm:Regular Rate:Normal     Neuro/Psych PSYCHIATRIC DISORDERS Anxiety negative neurological ROS     GI/Hepatic Neg liver ROS, GERD  Controlled,  Endo/Other  negative endocrine ROS  Renal/GU negative Renal ROS     Musculoskeletal   Abdominal   Peds  Hematology  (+) anemia ,   Anesthesia Other Findings   Reproductive/Obstetrics                             Anesthesia Physical Anesthesia Plan  ASA: II  Anesthesia Plan: General   Post-op Pain Management:    Induction: Intravenous  PONV Risk Score and Plan: 3 and Ondansetron, Dexamethasone and Midazolam  Airway Management Planned: LMA  Additional Equipment: None  Intra-op Plan:   Post-operative Plan: Extubation in OR  Informed Consent: I have reviewed the patients History and Physical, chart, labs and discussed the procedure including the risks, benefits and alternatives for the proposed anesthesia with the patient or authorized representative who has indicated his/her understanding and acceptance.   Dental advisory given  Plan Discussed with: CRNA and Surgeon  Anesthesia Plan Comments:         Anesthesia Quick Evaluation

## 2017-10-19 NOTE — Anesthesia Postprocedure Evaluation (Signed)
Anesthesia Post Note  Patient: Melanie Moss  Procedure(s) Performed: REMOVAL OF TISSUE EXPANDER (Right Breast)     Patient location during evaluation: PACU Anesthesia Type: General Level of consciousness: awake and alert Pain management: pain level controlled Vital Signs Assessment: post-procedure vital signs reviewed and stable Respiratory status: spontaneous breathing, nonlabored ventilation, respiratory function stable and patient connected to nasal cannula oxygen Cardiovascular status: blood pressure returned to baseline and stable Postop Assessment: no apparent nausea or vomiting Anesthetic complications: no    Last Vitals:  Vitals:   10/19/17 0930 10/19/17 0950  BP: (!) 147/85 (!) 143/90  Pulse: 95 93  Resp: 20 (!) 29  Temp: 36.5 C   SpO2: 99% 100%    Last Pain:  Vitals:   10/19/17 0950  TempSrc:   PainSc: 5                  Lorisa Scheid

## 2017-10-19 NOTE — Anesthesia Procedure Notes (Addendum)
Procedure Name: LMA Insertion Date/Time: 10/19/2017 7:43 AM Performed by: Cleda Daub, CRNA Pre-anesthesia Checklist: Patient identified, Emergency Drugs available, Suction available and Patient being monitored Patient Re-evaluated:Patient Re-evaluated prior to induction Oxygen Delivery Method: Circle system utilized Preoxygenation: Pre-oxygenation with 100% oxygen Induction Type: IV induction LMA: LMA inserted LMA Size: 4.0 Number of attempts: 1 Placement Confirmation: positive ETCO2 and breath sounds checked- equal and bilateral Tube secured with: Tape Dental Injury: Teeth and Oropharynx as per pre-operative assessment

## 2017-10-19 NOTE — Discharge Instructions (Signed)
Continue binder or sports bra. May shower on Tuesday. No heavy lifting. May drive when not taking any pain meds.

## 2017-10-20 ENCOUNTER — Encounter (HOSPITAL_COMMUNITY): Payer: Self-pay | Admitting: Plastic Surgery

## 2017-10-24 LAB — AEROBIC/ANAEROBIC CULTURE W GRAM STAIN (SURGICAL/DEEP WOUND)

## 2017-11-11 ENCOUNTER — Other Ambulatory Visit: Payer: Self-pay | Admitting: Hematology

## 2017-11-11 DIAGNOSIS — Z1231 Encounter for screening mammogram for malignant neoplasm of breast: Secondary | ICD-10-CM

## 2017-11-11 DIAGNOSIS — Z923 Personal history of irradiation: Secondary | ICD-10-CM | POA: Insufficient documentation

## 2017-11-14 ENCOUNTER — Ambulatory Visit
Admission: RE | Admit: 2017-11-14 | Discharge: 2017-11-14 | Disposition: A | Discharge status: Home / Self Care | Payer: BLUE CROSS/BLUE SHIELD | Source: Ambulatory Visit | Attending: Hematology | Admitting: Hematology

## 2017-11-14 DIAGNOSIS — Z1231 Encounter for screening mammogram for malignant neoplasm of breast: Secondary | ICD-10-CM

## 2017-11-27 NOTE — Progress Notes (Signed)
[place]  Telephone:([phone]) [phone] Fax:(336) [phone]  Clinic Follow Up Note   Patient Care Team: Lucianne Lei, MD as PCP - General (Family Medicine) Excell Seltzer, MD as Consulting Physician (General Surgery) Truitt Merle, MD as Consulting Physician (Hematology) Gardenia Phlegm, NP as Nurse Practitioner (Hematology and Oncology) [name], MD as Consulting Physician (Radiation Oncology)   Date of Service:  11/29/2017  CHIEF COMPLAINTS:  Follow up right breast cancer  Oncology History   Breast cancer of lower-outer quadrant of right female breast [facility])   Staging form: Breast, AJCC 7th Edition     Clinical stage from 08/03/2015: Stage IA (T1c, N0, M0) - Unsigned     Pathologic stage from 09/29/2015: Stage IIIA (T3(m), N1a, cM0) - Signed by Truitt Merle, MD on 10/17/2015         Breast cancer of lower-outer quadrant of right female breast ([place])   07/25/2015 Mammogram    diagnostic mammogram and ultrasound showed a 11 mm mass in the 8:00 position of the right breast, small irregular lesion in the retroareolar regio of the right breast, contiguous with the dermis    07/26/2015 Initial Diagnosis    Breast cancer of lower-outer quadrant of right female breast ([place])    07/26/2015 Initial Biopsy    right breast needle core biopsy at 8:00 position (1), invasive ductal carcinoma, grade 3, 6:30 o'clock position invasive (2) and in situ ductal carcinoma, grade 1-2, lymphovascular invasion is identified    07/26/2015 Receptors her2    (1) ER 95% positive, PR 95% positive, Ki-67 30%, HER-2 positive with ratio 2.04, copy # 5.20, (2) ER 90% positive, PR 95% positive, Ki-67 10%, HER-2 negative.    08/02/2015 Imaging    breast MRI showed extensive mass  and non-mass enhancement in the lower outer quadrant of the right breast  spanning 13 cm, focal enhancement within the right nipple, at least 3-4 abnormal appearing right axillary lymph node.    09/29/2015 Surgery    right  mastectomy and axillar node dissection     09/29/2015 Pathology Results    Right mastectomy and axillary node dissection showed multifocal mammary carcinoma with ductal and lobular features, tumor size 13 cm, 2.1 cm, 1.9 cm, and 0.5 cm, grade 2-3, anterior margins were positive, 2 nodes with metastasis and one with isolated cells    11/01/2015 - 02/14/2016 Chemotherapy    TCHP every 3 weeks, for 6 cycles, followed by herceptin maintenance therapy for a total of one year     03/06/2016 - 11/01/2016 Antibody Plan    Maintenance Herceptin and perjeta, every 3 weeks    03/07/2016 - 04/27/2016 Radiation Therapy    03/07/16-04/27/16 Site/dose: 1) 17fd right chest wall/ 45 Gy in 25 fractions 2) Right mastectomy scar / 16 Gy in 8 fractions    05/21/2016 -  Anti-estrogen oral therapy    Tamoxifen 20 mg    10/17/2016 Mammogram    IMPRESSION: No mammographic evidence of malignancy. A result letter of this screening mammogram will be mailed directly to the patient.    10/22/2016 Echocardiogram    Impressions:  - Normal study. Stable GLPSS at -21%. LVEF 60-65%.    11/09/2016 - 11/09/2017 Anti-estrogen oral therapy     Neratinib '240mg'$  daily started on 11/09/2016. Completed in one year    02/04/2017 Survivorship    Survivorship Clinic with CGardenia Phlegm NP    03/11/2017 Surgery    REMOVAL OF RIGHT TISSUE EXPANDERS WITH PLACEMRIGHT SILICONE  BREAST IMPLANTS and left  MASTOPEXY FOR SYMENTRY by Dr. Marla Roe  03/11/17    03/11/2017 Pathology Results    Diagnosis 03/11/17 1. Breast, capsule, right inferior BENIGN FIBROVASCULAR TISSUE 2. Breast, Mammoplasty, left BENIGN BREAST TISSUE NEGATIVE FOR ATYPIA OR MALIGNANCY     HISTORY OF PRESENTING ILLNESS (08/03/2015):  Melanie Moss 42 y.o. female is here because of Her newly diagnosed right breast cancer. She is accompanied by her fianc, sister and mother to our multidisciplinary breast clinic today.  She felt a breast lump and noticed  mild intermittent breast pain 2 weeks ago, no nipple discharge or skin change. She also has moderate fatigue and dyspnea on moderate to heavy exertion for a few months, no weight loss, no other pain or other symptoms.  She had normal mammogram 6 years ago, for a small lump which felt to be benign and no biopsy was done.    She has irregular period, twice a month, it lasts about 5-7 days, heavy for 3 days, she changes 6-7 times a day. She was found to be anemic recently, and has started taking oral iron pill a few weeks ago, tolerates well.  GYN HISTORY  Menarchal: 10  LMP: 07/29/2015 Contraceptive: no  HRT: n/a  G4P1: 3 miscarriage, one 64 yo son, no plan to have more children   CURRENT THERAPY: adjuvant  tamoxifen 20 mg started 05/21/16.   INTERIM HISTORY:  LETINA LUCKETT returns for follow-up. She is here alone. She completed one year adjuvant therapy with Neratinib on 10/2017. She is feeling well and tolerated treatment nicely. She had tissue expander surgery, but had to remove the implant due to infection in June 2019. She has had no menses for a year now. Her hot flashes are very mild and tolerable. She has gained 5Ibs since last visit and her appetite is improving.     MEDICAL HISTORY:  Past Medical History:  Diagnosis Date  . Anemia   . Anxiety   . Breast cancer of lower-outer quadrant of right female breast ([place]) 07/29/2015  . Family history of breast cancer   . Family history of colon cancer   . Family history of prostate cancer   . GERD (gastroesophageal reflux disease)    tums   . History of radiation therapy 03/07/16-04/27/16   right chest wall 45 Gy in 25 fractions, right mastectomy scar 16 Gy in 8 fractions  . Hyperlipidemia   . Hypertension   . Lymphedema   . Personal history of chemotherapy   . Personal history of radiation therapy   . Preterm labor    SURGICAL HISTORY: Past Surgical History:  Procedure Laterality Date  . AXILLARY LYMPH NODE DISSECTION Right  09/29/2015   Procedure: AXILLARY LYMPH NODE DISSECTION;  Surgeon: Excell Seltzer, MD;  Location: [place];  Service: General;  Laterality: Right;  . BREAST IMPLANT REMOVAL Right 06/01/2017   Procedure: EXPLANT OF RIGHT 480 MENTOR BREAST IMPLANT;  Surgeon: Wallace Going, DO;  Location: [place];  Service: Plastics;  Laterality: Right;  . BREAST RECONSTRUCTION Right 08/08/2017   Procedure: RIGHT BREAST RECONSTRUCTION WITH LATISSMA MYOCUTANEOUS FLAP AND EXPANDER PLACEMENT;  Surgeon: Wallace Going, DO;  Location: [place];  Service: Plastics;  Laterality: Right;  . BREAST RECONSTRUCTION WITH PLACEMENT OF TISSUE EXPANDER AND FLEX HD (ACELLULAR HYDRATED DERMIS) Right 09/29/2015   Procedure: BREAST RECONSTRUCTION WITH PLACEMENT OF TISSUE EXPANDER AND FLEX HD (ACELLULAR HYDRATED DERMIS);  Surgeon: Loel Lofty Dillingham, DO;  Location: [place];  Service: Plastics;  Laterality: Right;  . CERVICAL  CERCLAGE  02/19/2011   Procedure: CERCLAGE CERVICAL;  Surgeon: Agnes Lawrence, MD;  Location: [facility];  Service: Gynecology;  Laterality: N/A;  . DILATION AND CURETTAGE OF UTERUS    . INCISION AND DRAINAGE OF WOUND Right 04/19/2017   Procedure: IRRIGATION OF RIGHT BREAST POCKET;  Surgeon: Wallace Going, DO;  Location: [place];  Service: Plastics;  Laterality: Right;  . LATISSIMUS FLAP TO BREAST Right 08/08/2017   Procedure: LATISSIMUS FLAP TO BREAST;  Surgeon: Wallace Going, DO;  Location: [place];  Service: Plastics;  Laterality: Right;  . MASTECTOMY Right 2017   right 2017  . MASTECTOMY WITH AXILLARY LYMPH NODE DISSECTION Right 09/29/2015  . MASTOPEXY Left 03/11/2017   Procedure: MASTOPEXY FOR SYMENTRY;  Surgeon: Wallace Going, DO;  Location: [place];  Service: Plastics;  Laterality: Left;  . PORT-A-CATH REMOVAL  10/2016  . PORTACATH PLACEMENT N/A 09/29/2015   Procedure: INSERTION PORT-A-CATH;  Surgeon: Excell Seltzer, MD;  Location: [place];  Service:  General;  Laterality: N/A;  . REMOVAL OF BILATERAL TISSUE EXPANDERS WITH PLACEMENT OF BILATERAL BREAST IMPLANTS Right 03/11/2017   Procedure: REMOVAL OF RIGHT TISSUE EXPANDERS WITH PLACEMRIGHT SILICONE  BREAST IMPLANTS;  Surgeon: Wallace Going, DO;  Location: [place];  Service: Plastics;  Laterality: Right;  . REMOVAL OF TISSUE EXPANDER AND PLACEMENT OF IMPLANT Right 10/19/2017   Procedure: REMOVAL OF TISSUE EXPANDER;  Surgeon: Wallace Going, DO;  Location: [place];  Service: Plastics;  Laterality: Right;  . SIMPLE MASTECTOMY WITH AXILLARY SENTINEL NODE BIOPSY Right 09/29/2015   Procedure: RIGHT TOTAL  MASTECTOMY WITH AXILLARY SENTINEL NODE BIOPSY;  Surgeon: Excell Seltzer, MD;  Location: [place];  Service: General;  Laterality: Right;   SOCIAL HISTORY: Social History   Socioeconomic History  . Marital status: Single    Spouse name: Not on file  . Number of children: 1  . Years of education: Not on file  . Highest education level: Not on file  Occupational History  . Not on file  Social Needs  . Financial resource strain: Not on file  . Food insecurity:    Worry: Not on file    Inability: Not on file  . Transportation needs:    Medical: Not on file    Non-medical: Not on file  Tobacco Use  . Smoking status: Never Smoker  . Smokeless tobacco: Never Used  Substance and Sexual Activity  . Alcohol use: No  . Drug use: No  . Sexual activity: Yes    Birth control/protection: None  Lifestyle  . Physical activity:    Days per week: Not on file    Minutes per session: Not on file  . Stress: Not on file  Relationships  . Social connections:    Talks on phone: Not on file    Gets together: Not on file    Attends religious service: Not on file    Active member of club or organization: Not on file    Attends meetings of clubs or organizations: Not on file    Relationship status: Not on file  . Intimate partner violence:    Fear of current or ex partner:  Not on file    Emotionally abused: Not on file    Physically abused: Not on file    Forced sexual activity: Not on file  Other Topics Concern  . Not on file  Social History Narrative  . Not on file   FAMILY HISTORY:  Family History  Problem Relation Age of Onset  . Hypertension Mother   . Hypertension Father   . Prostate cancer Father 64  . Breast cancer Maternal Aunt 39  . Breast cancer Paternal Aunt 87  . Cirrhosis Maternal Uncle   . Prostate cancer Paternal Uncle   . Throat cancer Maternal Aunt        non smoker  . Prostate cancer Paternal Uncle   . Colon cancer Cousin        maternal first cousin dx in his 14s  . Lung cancer Cousin        smoker   ALLERGIES:  has No Known Allergies.  MEDICATIONS:  Current Outpatient Medications  Medication Sig Dispense Refill  . B Complex-C (SUPER B COMPLEX PO) Take 1 tablet by mouth daily.    . diazepam (VALIUM) 2 MG tablet Take 2 mg by mouth 2 (two) times daily as needed for muscle spasms.     Mar[place] Kitchen HYDROcodone-acetaminophen (NORCO) 5-325 MG tablet Take 1 tablet by mouth every 8 (eight) hours as needed for moderate pain. 20 tablet 0  . irbesartan-hydrochlorothiazide (AVALIDE) 150-12.5 MG tablet Take 1 tablet by mouth daily.    . Iron-FA-B Cmp-C-Biot-Probiotic (FUSION PLUS PO) Take 1 tablet by mouth daily.    . ondansetron (ZOFRAN) 4 MG tablet Take 1 tablet (4 mg total) by mouth daily as needed for nausea or vomiting. 10 tablet 1  . tamoxifen (NOLVADEX) 20 MG tablet Take 1 tablet (20 mg total) by mouth daily. 90 tablet 3  . potassium chloride SA (K-DUR,KLOR-CON) 20 MEQ tablet Take 1 tablet (20 mEq total) by mouth daily. Take 1 tab ( 20 mEq ) daily  For  7  Days. (Patient not taking: Reported on 11/29/2017) 30 tablet 0   No current facility-administered medications for this visit.    Facility-Administered Medications Ordered in Other Visits  Medication Dose Route Frequency Provider Last Rate Last Dose  . 0.9 %  sodium chloride infusion    Intravenous Once Truitt Merle, MD      . alteplase (CATHFLO ACTIVASE) injection 2 mg  2 mg Intracatheter Once PRN Truitt Merle, MD      . heparin lock flush 100 unit/mL  250 Units Intracatheter Once PRN Truitt Merle, MD      . heparin lock flush 100 unit/mL  500 Units Intravenous Once PRN Truitt Merle, MD      . sodium chloride 0.9 % injection 10 mL  10 mL Intracatheter PRN Truitt Merle, MD      . sodium chloride 0.9 % injection 10 mL  10 mL Intravenous PRN Truitt Merle, MD      . sodium chloride 0.9 % injection 3 mL  3 mL Intravenous Once PRN Truitt Merle, MD      . sodium chloride flush (NS) 0.9 % injection 10 mL  10 mL Intravenous PRN Truitt Merle, MD        REVIEW OF SYSTEMS:  Constitutional: Denies fevers, chills (+) weight gain and better appetite  Eyes:Denies blurry vision, double vision or watery eyes Ears, nose, mouth, throat, and face: Denies mucositis or sore throat Respiratory: Denies cough, dyspnea or wheezes  Cardiovascular: Denies palpitation, chest discomfort or lower extremity swelling Gastrointestinal:  Denies nausea, heartburn  Skin: Denies abnormal skin rashes Lymphatics: Denies new lymphadenopathy or easy bruising  Neurological:Denies numbness, tingling or new weaknesses  Behavioral/Psych: Mood is stable, no new changes  Breast: (+) removed implant in June 2019 due to infection All other systems were  reviewed with the patient and are negative.  PHYSICAL EXAMINATION:  ECOG PERFORMANCE STATUS: 1   Vitals:   11/29/17 0925  BP: 121/72  Pulse: 79  Resp: 18  Temp: 98.8 F (37.1 C)  SpO2: 100%   Filed Weights   11/29/17 0925  Weight: 134 lb 8 oz (61 kg)     GENERAL:alert, no distress and comfortable SKIN: skin color, texture, turgor are normal, Several scattered acne-like rash in her upper front chest.  EYES: normal, conjunctiva are pink and non-injected, sclera clear OROPHARYNX:no exudate, no erythema and lips, buccal mucosa, and tongue normal  NECK: supple, thyroid normal size,  non-tender, without nodularity LYMPH:  no palpable lymphadenopathy in the cervical, axillary or inguinal LUNGS: clear to auscultation and percussion with normal breathing effort HEART: regular rate & rhythm and no murmurs and no lower extremity edema ABDOMEN:abdomen soft, non-tender and normal bowel sounds Musculoskeletal:no cyanosis of digits and no clubbing  PSYCH: alert & oriented x 3 with fluent speech NEURO: no focal motor/sensory deficits Breasts:  Status post right mastectomy (+) Implant was removed, surgical incison has healed. No erythema, discharge or palpable mass. Palpation of the left breast and bilateral axilla showed no palpable mass. (+) mild numbness at site of incision    LABORATORY DATA:  I have reviewed the data as listed CBC Latest Ref Rng & Units 11/29/2017 10/19/2017 08/09/2017  WBC 3.9 - 10.3 K/uL 5.7 6.6 11.2(H)  Hemoglobin 11.6 - 15.9 g/dL 11.9 11.1(L) 8.5(L)  Hematocrit 34.8 - 46.6 % 37.1 35.7(L) 26.2(L)  Platelets 145 - 400 K/uL 234 300 187    CMP Latest Ref Rng & Units 11/29/2017 10/19/2017 08/09/2017  Glucose 70 - 99 mg/dL 119(H) 90 151(H)  BUN 6 - 20 mg/dL '16 14 9  '$ Creatinine 0.44 - 1.00 mg/dL 1.03(H) 1.00 0.94  Sodium 135 - 145 mmol/L 140 140 137  Potassium 3.5 - 5.1 mmol/L 3.9 4.2 3.8  Chloride 98 - 111 mmol/L 108 105 106  CO2 22 - 32 mmol/L '23 27 23  '$ Calcium 8.9 - 10.3 mg/dL 9.4 9.4 8.3(L)  Total Protein 6.5 - 8.1 g/dL 7.8 - -  Total Bilirubin 0.3 - 1.2 mg/dL 0.2(L) - -  Alkaline Phos 38 - 126 U/L 53 - -  AST 15 - 41 U/L 22 - -  ALT 0 - 44 U/L 15 - -    PATHOLOGY:  Diagnosis 03/11/17 1. Breast, capsule, right inferior BENIGN FIBROVASCULAR TISSUE 2. Breast, Mammoplasty, left BENIGN BREAST TISSUE NEGATIVE FOR ATYPIA OR MALIGNANCY  Diagnosis 09/29/2015 1. Lymph node, sentinel, biopsy, Right axillary #1 - ONE BENIGN LYMPH NODE WITH NO TUMOR SEEN (0/1). 2. Lymph node, sentinel, biopsy, Right axillary #2 - ONE LYMPH NODE POSITIVE FOR METASTATIC  MAMMARY CARCINOMA (1/1). 3. Lymph node, sentinel, biopsy, Right axillary #4 - ONE BENIGN LYMPH NODE WITH NO TUMOR SEEN (0/1). 4. 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 5. 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 - LARGEST FOCUS OF INVASIVE MAMMARY CARCINOMA IS GRADE 2, SHOWS ASSOCIATED LOBULAR CARCINOMA IN SITU AND MEASURES 13 CM IN GREATEST DIMENSION. - SECOND LARGEST FOCUS SHOWS INVASIVE GRADE 3 MAMMARY CARCINOMA AND SPANS 2.1 CM IN GREATEST DIMENSION. - THIRD LARGEST FOCUS SHOWS INVASIVE GRADE 2 MAMMARY CARCINOMA WITH LOBULAR CARCINOMA IN SITU AND MEASURES 1.9 CM IN GREATEST DIMENSION. - SMALLEST FOCUS SHOWS INVASIVE GRADE 3 MAMMARY CARCINOMA AND MEASURES 0.5 CM  IN GREATEST DIMENSION. - ANTERIOR INFERIOR SOFT TISSUE MARGIN DEMONSTRATES BROAD POSITIVITY FOR INVASIVE MAMMARY CARCINOMA. - OTHER MARGINS ARE NEGATIVE. - SEE ONCOLOGY TEMPLATE. 6. Lymph node, sentinel, biopsy, Right axillary #3 - ONE BENIGN LYMPH NODE WITH NO TUMOR SEEN (0/1). 7. Lymph node, sentinel, biopsy, Right axillary #6 - ONE LYMPH NODE WITH ISOLATED TUMOR CELLS. 8. Lymph nodes, regional resection, Right axillary contents - ELEVEN BENIGN LYMPH NODES WITH NO TUMOR SEEN (0/11). Specimen, including laterality and lymph node sampling (sentinel, non-sentinel): Right breast with right sentinel and non-sentinel lymph nodes. Procedure: Right modified mastectomy with sentinel lymph node biopsy and axillary dissection. Histologic type: Multifocal mammary carcinoma with ductal and lobular features. Largest focus (13 cm): Grade: 2. Tubule formation: 3. Nuclear pleomorphism: 2. Mitotic: 1. Second largest focus (2.1 cm): Grade: 3. Tubule formation: 3. Nuclear pleomorphism: 2. Mitotic: 3. Third largest focus (1.9 cm): Grade: 2. Tubule formation: 3. Nuclear  pleomorphism: 2. Mitotic: 1. Smallest focus (0.5 cm): Grade: 3. Tubule formation: 3. Nuclear pleomorphism: 2. Mitotic: 3. Tumor size (gross measurements): 13 cm, 2.1 cm, 1.9 cm and 0.5 cm. Margins: Invasive, distance to closest margin: Invasive mammary carcinoma broadly involves the anterior inferior soft tissue margin. In-situ, distance to closest margin: Margin uninvolved by in situ carcinoma. If margin positive, focally or broadly: Broadly positive. Lymphovascular invasion: Yes, identified. Ductal carcinoma in situ: Not identified. Grade: Not applicable. Extensive intraductal component: Not applicable. Lobular neoplasia: Yes, lobular carcinoma in situ is present. Tumor focality: Multifocal. Treatment effect: Not applicable. If present, treatment effect in breast tissue, lymph nodes or both: Not applicable. Extent of tumor: Skin: Not involved. Nipple: Not involved. Skeletal muscle: Not received. Lymph nodes: Examined: 6 Sentinel. 11 Non-sentinel. 17 Total. Lymph nodes with metastasis: 2 with metastasis and 1 with isolated tumor cells. Isolated tumor cells (< 0.2 mm): 1. Micrometastasis: (> 0.2 mm and < 2.0 mm): 0. Macrometastasis: (> 2.0 mm): 2. Extracapsular extension: Not identified.  Breast prognostic profile: Performed on previous case, [id number]. Right 8 o'clock biopsy 12 cm from the nipple: Estrogen receptor: 95%, positive. Progesterone receptor: 95%, positive. Her-2 neu: 2.04 ratio, positive. Ki-67: 30%. Right breast needle core biopsy 6:30 o'clock position 1 cm from the nipple: Estrogen receptor: 90%, positive. Progesterone receptor: 95%, positive. Her-2 neu: 1.39 ratio, negative. Ki-67: 10%. Non-neoplastic breast: No significant non-neoplastic breast findings.  TNM: pT3(m), pN1a. Comments: A cytokeratin AE1/AE3 immunohistochemical stain is performed on eight blocks containing lymph node tissue (eight stains total). The morphology coupled with the  staining pattern is consistent with the above findings. An E-cadherin immunohistochemical stain is also performed on four blocks from the right simple mastectomy specimen. The staining pattern coupled with the morphology is consistent with the above findings. As there was Her-2 neu positivity in one of the biopsies and both biopsies were positive for estrogen receptor, a repeat breast prognostic profile will not be performed unless otherwise requested. Dr. Lyndon Code has seen selected slides 5E, 54F, 5G, 5J, 5N, 5O, and 5P with agreement that the tumor is a mulitfocal invasive mammary carcinoma with a mixed ductal and lobular phenotype. Dr. Tresa Moore has also seen a selected slide (5P) with agreement of the positive anterior inferior margin. (RH:ecj 10/03/2015)   RADIOGRAPHIC STUDIES: I have personally reviewed the radiological images as listed and agreed with the findings in the report. No new scans  11/14/2017 Mammogram IMPRESSION: No mammographic evidence of malignancy. A result letter of this screening mammogram will be mailed directly to the patient.  CT chest, abdomen and pelvis  10/27/2015 IMPRESSION: Status post right mastectomy and right axillary lymph node Dissection. No findings specific for metastatic disease. Scattered hepatic cysts measuring up to 12 mm. A single 8 mm hypoenhancing lesion is technically indeterminate, but also likely benign, although motion degraded. Consider attention on follow-up.  Bone scan 10/28/2015 IMPRESSION: Small focus of increased radiotracer uptake within the right lateral skull at the region of the right temporal bone. CT of the head may be considered for further evaluation. Otherwise no evidence of osseous metastatic disease in the axial skeleton.  CT head with and without contrast 11/07/2015 IMPRESSION: 1. Indeterminate 10-11 mm area of heterogeneous bone mineralization along the right sphenoid wing which may correspond to the recent bone scan finding.  This might be physiologic. A Repeat CT head (e.g. in 8-12 weeks, Noncontrast should suffice but with and without contrast could be done if repeat brain staging is desired) may be most valuable to evaluate stability. 2. No CT evidence of metastatic disease to the brain. Mild nonspecific white matter changes.  Echo 04/10/2016 Impressions: - Normal LV size with EF 65-70%. Strain as above. Normal RV size and systolic function. No significant valvular abnormalities.  Mammogram 10/17/2016 IMPRESSION: No mammographic evidence of malignancy. A result letter of this screening mammogram will be mailed directly to the patient.  ASSESSMENT & PLAN:  42 y.o. African-American female, premenopausal, presented with a palpable right breast mass.  1. 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 Her initial biopsy showed one triple positive disease, the other was ER/PR positive and HER2 Negative disease. -Her staging scans were negative for distant metastasis. -We previously discussed that HER-2 positive breast cancer are more aggressive, with a high risk for recurrence after surgery, especially in the setting of locally advanced stage.  -she completed adjuvant chemotherapy TCHP (docetaxel, carboplatin, Herceptin, pejeta) every 3 weeks for total of 6 cycles, followed by Herceptin maintenance therapy to complete 1 year treatment. -She underwent adjuvant radiation therapy by Dr. Sondra Come 03/07/16-04/27/2016. Tolerated well. -She started adjuvant antiestrogen therapy with tamoxifen on 05/21/16, tolerating well overall, with moderate hot flashes that have now resolved. -The benefit of ovarian suppression is  less clear in HER2 positive disease. -she started Neratinib on 11/09/2016 with main side effect of moderate diarrhea, which has much improved lately. She has been using Imodium, not needing Lomotil. Completed on 11/09/2017 -She had breast reconstruction by Dr. Marla Roe on 03/11/17. She is healing well and plans to return for another surgery early next year.  -She was scheduled for reconstruction surgery on 08/08/17 with Dr. Marla Roe. She opted for reconstruction with TRAM flap. Her implant was finally removed due to multiple infection. - Mammogram on 09/2017 was benign -Continue breast cancer surveillance. She is tolerating Tamoxifen and Nerlynx well, she will continue. -she has completed one year adjuvant Nerlynix in July 2019  -she has not had menstrual period since July 2018, I checked her [facility] level to see if she is truly postmenopausal today.  I will switch her tamoxifen to aromatase inhibitor if she is postmenopausal. -If she is still premenopausal, she is interested in ovarian suppression.  We discussed the data and the benefit, and potential side effect from ovarian suppression.  She is open to start Zoladex injection first. -She  is clinically doing well. Lab reviewed, her CBC show mild anemia with Hg at 11.9 and CMP pending. Her breast exam was unremarkable. She had her implant removed 6 weeks ago and it is healing well. There is no clinical concern for recurrence. -f/u in 4 months     2. Iron deficient anemia, and anemia of chronic disease  -Her lab tests showed a mild to moderate anemia. -Her anemia previously resolved after IV Feraheme, slightly worse after she started chemotherapy. -I previously encouraged her to continue oral iron pill -Her repeated iron level and a ferritin normalized.  -She has required blood transfusion in the past - Her menstrual cycle has returned. I previously encouraged her to take her iron pills twice daily for the next 3 months then once daily. -She has not  had a menstrual.  For over a year now. -12/07/16 iron studies showed normal ferritin, low TIBC, consistent with anemia of chronic disease  -Anemia resolved now, repeat iron study today was normal.   3. Genetic -Given her young age and positive family history, we strongly recommend her to [facility] genetic counseling for genetic testing to ruled out inheritable breast cancer syndrome. She agrees. -Her genetic testing was normal  4.  HTN -She was noticed to have significant elevated blood pressure previously in clinic -She has previously seen her primary care physician, and her blood pressure medication has been adjusted, including HCTZ  5. Right arm lymphedema  -Secondary to breast surgery and lymph node biopsy -improved  -She is using compression cast for her right arm. -She has a pump from PT and she says it swells more on the pump, will F/u with PT for more help -Continue with compression sleeves and manual pump.   6. Hot flashes and mood swings. -The Effexor made her feel loopy and she has stopped taking it. -I previously started her on Neurontin for her nocturnal hot flash and peripheral neuropathy, but she only took it once due to feeling loopy. -Hot flashes have now resolved -Due to her depression and low appetite, I previously recommend her to try mirtazapine, will start her on 15 mg at bedtime.  Potential side effects and benefits discussed, she is willing to proceed. -Her hot flashes are tolerable, she is able to sleep  8. Low appetite, weight loss -Given her recent weight loss due to no appetite and overall down mood, I previously recommend low to moderate dose Mirtazapine. I discussed the side effects with her and she agreed to try it so I called it on 04/04/17  -She states mirtazapine made her sleepy, I advised her she can cut it in half and try it to help stimulate her appetite. -Her appetite has normalized after she completed Neratinib    Plan -Continue Tamoxifen -I will  check [place] levels today and consider changing to aromatase inhibitor is she's truly menopausal, or start Zoladex injection if she is premenopausal. -next Mammogram in 10/2018 -Lab and f/u in 4 months, or sooner if needed    I spent 20 minutes counseling the patient face to face. The total time spent in the appointment was 25 minutes and more than 50% was on counseling.   Dierdre Searles Dweik am acting as scribe for Dr. Truitt Merle.  I have reviewed the above documentation for accuracy and completeness, and I agree with the above.    Truitt Merle, MD 11/29/2017

## 2017-11-29 ENCOUNTER — Inpatient Hospital Stay: Payer: BLUE CROSS/BLUE SHIELD

## 2017-11-29 ENCOUNTER — Telehealth: Payer: Self-pay

## 2017-11-29 ENCOUNTER — Inpatient Hospital Stay: Payer: BLUE CROSS/BLUE SHIELD | Attending: Hematology | Admitting: Hematology

## 2017-11-29 ENCOUNTER — Encounter: Payer: Self-pay | Admitting: Hematology

## 2017-11-29 VITALS — BP 121/72 | HR 79 | Temp 98.8°F | Resp 18 | Ht 63.0 in | Wt 134.5 lb

## 2017-11-29 DIAGNOSIS — C50511 Malignant neoplasm of lower-outer quadrant of right female breast: Secondary | ICD-10-CM | POA: Insufficient documentation

## 2017-11-29 DIAGNOSIS — Z923 Personal history of irradiation: Secondary | ICD-10-CM | POA: Diagnosis not present

## 2017-11-29 DIAGNOSIS — Z9011 Acquired absence of right breast and nipple: Secondary | ICD-10-CM | POA: Insufficient documentation

## 2017-11-29 DIAGNOSIS — Z862 Personal history of diseases of the blood and blood-forming organs and certain disorders involving the immune mechanism: Secondary | ICD-10-CM | POA: Diagnosis not present

## 2017-11-29 DIAGNOSIS — Z79899 Other long term (current) drug therapy: Secondary | ICD-10-CM | POA: Insufficient documentation

## 2017-11-29 DIAGNOSIS — F329 Major depressive disorder, single episode, unspecified: Secondary | ICD-10-CM | POA: Insufficient documentation

## 2017-11-29 DIAGNOSIS — I10 Essential (primary) hypertension: Secondary | ICD-10-CM | POA: Insufficient documentation

## 2017-11-29 DIAGNOSIS — F5089 Other specified eating disorder: Secondary | ICD-10-CM | POA: Diagnosis not present

## 2017-11-29 DIAGNOSIS — I972 Postmastectomy lymphedema syndrome: Secondary | ICD-10-CM | POA: Diagnosis not present

## 2017-11-29 DIAGNOSIS — D500 Iron deficiency anemia secondary to blood loss (chronic): Secondary | ICD-10-CM

## 2017-11-29 DIAGNOSIS — Z170 Estrogen receptor positive status [ER+]: Principal | ICD-10-CM

## 2017-11-29 DIAGNOSIS — Z79810 Long term (current) use of selective estrogen receptor modulators (SERMs): Secondary | ICD-10-CM | POA: Diagnosis not present

## 2017-11-29 DIAGNOSIS — Z9221 Personal history of antineoplastic chemotherapy: Secondary | ICD-10-CM | POA: Insufficient documentation

## 2017-11-29 LAB — IRON AND TIBC
Iron: 77 ug/dL (ref 41–142)
Saturation Ratios: 29 % (ref 21–57)
TIBC: 263 ug/dL (ref 236–444)
UIBC: 186 ug/dL

## 2017-11-29 LAB — COMPREHENSIVE METABOLIC PANEL
ALT: 15 U/L (ref 0–44)
AST: 22 U/L (ref 15–41)
Albumin: 4.2 g/dL (ref 3.5–5.0)
Alkaline Phosphatase: 53 U/L (ref 38–126)
Anion gap: 9 (ref 5–15)
BUN: 16 mg/dL (ref 6–20)
CO2: 23 mmol/L (ref 22–32)
Calcium: 9.4 mg/dL (ref 8.9–10.3)
Chloride: 108 mmol/L (ref 98–111)
Creatinine, Ser: 1.03 mg/dL — ABNORMAL HIGH (ref 0.44–1.00)
GFR calc Af Amer: >60 mL/min (ref >60)
GFR calc non Af Amer: >60 mL/min (ref >60)
Glucose, Bld: 119 mg/dL — ABNORMAL HIGH (ref 70–99)
Potassium: 3.9 mmol/L (ref 3.5–5.1)
Sodium: 140 mmol/L (ref 135–145)
Total Bilirubin: 0.2 mg/dL — ABNORMAL LOW (ref 0.3–1.2)
Total Protein: 7.8 g/dL (ref 6.5–8.1)

## 2017-11-29 LAB — CBC WITH DIFFERENTIAL/PLATELET
Basophils Absolute: 0 10*3/uL (ref 0.0–0.1)
Basophils Relative: 1 %
Eosinophils Absolute: 0.3 10*3/uL (ref 0.0–0.5)
Eosinophils Relative: 5 %
HCT: 37.1 % (ref 34.8–46.6)
Hemoglobin: 11.9 g/dL (ref 11.6–15.9)
Lymphocytes Relative: 38 %
Lymphs Abs: 2.2 10*3/uL (ref 0.9–3.3)
MCH: 30.1 pg (ref 25.1–34.0)
MCHC: 32.1 g/dL (ref 31.5–36.0)
MCV: 93.7 fL (ref 79.5–101.0)
Monocytes Absolute: 0.3 10*3/uL (ref 0.1–0.9)
Monocytes Relative: 6 %
Neutro Abs: 2.9 10*3/uL (ref 1.5–6.5)
Neutrophils Relative %: 50 %
Platelets: 234 10*3/uL (ref 145–400)
RBC: 3.96 MIL/uL (ref 3.70–5.45)
RDW: 14.1 % (ref 11.2–14.5)
WBC: 5.7 10*3/uL (ref 3.9–10.3)

## 2017-11-29 LAB — FERRITIN: Ferritin: 149 ng/mL (ref 11–307)

## 2017-11-29 NOTE — Telephone Encounter (Signed)
Printed avs and calender of upcoming appointment. Per 8/9 los 

## 2017-11-30 ENCOUNTER — Other Ambulatory Visit: Payer: Self-pay | Admitting: Hematology

## 2017-11-30 LAB — FOLLICLE STIMULATING HORMONE: FSH: 4 m[IU]/mL

## 2017-12-02 ENCOUNTER — Telehealth: Payer: Self-pay

## 2017-12-02 NOTE — Telephone Encounter (Signed)
Per Dr. Burr Medico spoke with patient informed her [facility] level shows she is still pre-menopausal.  Dr. Burr Medico is arranging for Zoladex injection monthly and we will see her back after second injection to discuss switching Tamoxifen to letrozole.  Patient verbalized an understanding.  Scheduling message sent.

## 2017-12-02 NOTE — Telephone Encounter (Signed)
-----   Message from Truitt Merle, MD sent at 11/30/2017 12:12 PM EDT ----- Please let pt know her [place] level shows she is still pre-menopausal. I will arrange Zoladex injection monthly and see her back with second injection to discuss switching Taxomifen to letrozole. Thanks   Truitt Merle  11/30/2017

## 2017-12-03 ENCOUNTER — Telehealth: Payer: Self-pay | Admitting: Hematology

## 2017-12-03 NOTE — Telephone Encounter (Signed)
Spoke with patient about appointments being added to her schedule/ letter/calendar mailed per 8/10 sch msg

## 2017-12-16 ENCOUNTER — Inpatient Hospital Stay: Payer: BLUE CROSS/BLUE SHIELD

## 2017-12-16 VITALS — BP 109/78 | HR 89 | Temp 98.3°F | Resp 18

## 2017-12-16 DIAGNOSIS — D500 Iron deficiency anemia secondary to blood loss (chronic): Secondary | ICD-10-CM

## 2017-12-16 DIAGNOSIS — C50511 Malignant neoplasm of lower-outer quadrant of right female breast: Secondary | ICD-10-CM

## 2017-12-16 LAB — CBC WITH DIFFERENTIAL/PLATELET
Basophils Absolute: 0 10*3/uL (ref 0.0–0.1)
Basophils Relative: 1 %
Eosinophils Absolute: 0.2 10*3/uL (ref 0.0–0.5)
Eosinophils Relative: 3 %
HCT: 36.8 % (ref 34.8–46.6)
Hemoglobin: 12.2 g/dL (ref 11.6–15.9)
Lymphocytes Relative: 43 %
Lymphs Abs: 3.2 10*3/uL (ref 0.9–3.3)
MCH: 30.3 pg (ref 25.1–34.0)
MCHC: 33.1 g/dL (ref 31.5–36.0)
MCV: 91.5 fL (ref 79.5–101.0)
Monocytes Absolute: 0.4 10*3/uL (ref 0.1–0.9)
Monocytes Relative: 6 %
Neutro Abs: 3.5 10*3/uL (ref 1.5–6.5)
Neutrophils Relative %: 47 %
Platelets: 256 10*3/uL (ref 145–400)
RBC: 4.02 MIL/uL (ref 3.70–5.45)
RDW: 13.8 % (ref 11.2–14.5)
WBC: 7.3 10*3/uL (ref 3.9–10.3)

## 2017-12-16 LAB — COMPREHENSIVE METABOLIC PANEL
ALT: 12 U/L (ref 0–44)
AST: 22 U/L (ref 15–41)
Albumin: 4.2 g/dL (ref 3.5–5.0)
Alkaline Phosphatase: 46 U/L (ref 38–126)
Anion gap: 8 (ref 5–15)
BUN: 14 mg/dL (ref 6–20)
CO2: 29 mmol/L (ref 22–32)
Calcium: 10.3 mg/dL (ref 8.9–10.3)
Chloride: 103 mmol/L (ref 98–111)
Creatinine, Ser: 0.92 mg/dL (ref 0.44–1.00)
GFR calc Af Amer: >60 mL/min (ref >60)
GFR calc non Af Amer: >60 mL/min (ref >60)
Glucose, Bld: 99 mg/dL (ref 70–99)
Potassium: 3.8 mmol/L (ref 3.5–5.1)
Sodium: 140 mmol/L (ref 135–145)
Total Bilirubin: 0.3 mg/dL (ref 0.3–1.2)
Total Protein: 7.7 g/dL (ref 6.5–8.1)

## 2017-12-16 MED ORDER — GOSERELIN ACETATE 3.6 MG ~~LOC~~ IMPL
3.6000 mg | DRUG_IMPLANT | Freq: Once | SUBCUTANEOUS | Status: AC
  Administered 2017-12-16: 3.6 mg via SUBCUTANEOUS

## 2018-01-15 NOTE — Progress Notes (Signed)
[place]  [phone] [phone]  Clinic Follow Up Note   Patient Care Team: Lucianne Lei, MD as PCP - General (Family Medicine) Excell Seltzer, MD as Consulting Physician (General Surgery) Truitt Merle, MD as Consulting Physician (Hematology) Gardenia Phlegm, NP as Nurse Practitioner (Hematology and Oncology) [name], MD as Consulting Physician (Radiation Oncology)   Date of Service:  01/16/2018  CHIEF COMPLAINTS:  Follow up right breast cancer  Oncology History   Breast cancer of lower-outer quadrant of right female breast [facility])   Staging form: Breast, AJCC 7th Edition     Clinical stage from 08/03/2015: Stage IA (T1c, N0, M0) - Unsigned     Pathologic stage from 09/29/2015: Stage IIIA (T3(m), N1a, cM0) - Signed by Truitt Merle, MD on 10/17/2015         Breast cancer of lower-outer quadrant of right female breast ([place])   07/25/2015 Mammogram    diagnostic mammogram and ultrasound showed a 11 mm mass in the 8:00 position of the right breast, small irregular lesion in the retroareolar regio of the right breast, contiguous with the dermis    07/26/2015 Initial Diagnosis    Breast cancer of lower-outer quadrant of right female breast ([place])    07/26/2015 Initial Biopsy    right breast needle core biopsy at 8:00 position (1), invasive ductal carcinoma, grade 3, 6:30 o'clock position invasive (2) and in situ ductal carcinoma, grade 1-2, lymphovascular invasion is identified    07/26/2015 Receptors her2    (1) ER 95% positive, PR 95% positive, Ki-67 30%, HER-2 positive with ratio 2.04, copy # 5.20, (2) ER 90% positive, PR 95% positive, Ki-67 10%, HER-2 negative.    08/02/2015 Imaging    breast MRI showed extensive mass  and non-mass enhancement in the lower outer quadrant of the right breast  spanning 13 cm, focal enhancement within the right nipple, at least 3-4 abnormal appearing right axillary lymph node.    09/29/2015 Surgery    right  mastectomy and axillar node dissection     09/29/2015 Pathology Results    Right mastectomy and axillary node dissection showed multifocal mammary carcinoma with ductal and lobular features, tumor size 13 cm, 2.1 cm, 1.9 cm, and 0.5 cm, grade 2-3, anterior margins were positive, 2 nodes with metastasis and one with isolated cells    11/01/2015 - 02/14/2016 Chemotherapy    TCHP every 3 weeks, for 6 cycles, followed by herceptin maintenance therapy for a total of one year     03/06/2016 - 11/01/2016 Antibody Plan    Maintenance Herceptin and perjeta, every 3 weeks    03/07/2016 - 04/27/2016 Radiation Therapy    03/07/16-04/27/16 Site/dose: 1) 63fd right chest wall/ 45 Gy in 25 fractions 2) Right mastectomy scar / 16 Gy in 8 fractions    05/21/2016 -  Anti-estrogen oral therapy    Tamoxifen 20 mg    10/17/2016 Mammogram    IMPRESSION: No mammographic evidence of malignancy. A result letter of this screening mammogram will be mailed directly to the patient.    10/22/2016 Echocardiogram    Impressions:  - Normal study. Stable GLPSS at -21%. LVEF 60-65%.    11/09/2016 - 11/09/2017 Anti-estrogen oral therapy     Neratinib 2455mdaily started on 11/09/2016. Completed in one year    02/04/2017 Survivorship    Survivorship Clinic with CaGardenia PhlegmNP    03/11/2017 Surgery    REMOVAL OF RIGHT TISSUE EXPANDERS WITH PLACEMRIGHT SILICONE  BREAST IMPLANTS and left  MASTOPEXY FOR SYMENTRY by Dr. Marla Roe  03/11/17    03/11/2017 Pathology Results    Diagnosis 03/11/17 1. Breast, capsule, right inferior BENIGN FIBROVASCULAR TISSUE 2. Breast, Mammoplasty, left BENIGN BREAST TISSUE NEGATIVE FOR ATYPIA OR MALIGNANCY     HISTORY OF PRESENTING ILLNESS (08/03/2015):  Melanie Moss 42 y.o. female is here because of Her newly diagnosed right breast cancer. She is accompanied by her fianc, sister and mother to our multidisciplinary breast clinic today.  She felt a breast lump and noticed  mild intermittent breast pain 2 weeks ago, no nipple discharge or skin change. She also has moderate fatigue and dyspnea on moderate to heavy exertion for a few months, no weight loss, no other pain or other symptoms.  She had normal mammogram 6 years ago, for a small lump which felt to be benign and no biopsy was done.    She has irregular period, twice a month, it lasts about 5-7 days, heavy for 3 days, she changes 6-7 times a day. She was found to be anemic recently, and has started taking oral iron pill a few weeks ago, tolerates well.  GYN HISTORY  Menarchal: 10  LMP: 07/29/2015 Contraceptive: no  HRT: n/a  G4P1: 3 miscarriage, one 54 yo son, no plan to have more children   CURRENT THERAPY: adjuvant  tamoxifen 20 mg started 05/21/16. Switched to Letrozole on 9/27//2019; Zoladex monthly injection started on December 16, 2017   INTERIM HISTORY:  Melanie Moss returns for follow-up. She started Zoladex injection last month, tolerated very well, no noticeable side effects.  Today, she is here alone at the clinic.  She is doing well overall, denies any significant pain or new symptoms. Her BMs are regular and normal. Her appetite is good and she is eating well.     MEDICAL HISTORY:  Past Medical History:  Diagnosis Date  . Anemia   . Anxiety   . Breast cancer of lower-outer quadrant of right female breast ([place]) 07/29/2015  . Family history of breast cancer   . Family history of colon cancer   . Family history of prostate cancer   . GERD (gastroesophageal reflux disease)    tums   . History of radiation therapy 03/07/16-04/27/16   right chest wall 45 Gy in 25 fractions, right mastectomy scar 16 Gy in 8 fractions  . Hyperlipidemia   . Hypertension   . Lymphedema   . Personal history of chemotherapy   . Personal history of radiation therapy   . Preterm labor    SURGICAL HISTORY: Past Surgical History:  Procedure Laterality Date  . AXILLARY LYMPH NODE DISSECTION Right 09/29/2015    Procedure: AXILLARY LYMPH NODE DISSECTION;  Surgeon: Excell Seltzer, MD;  Location: [place];  Service: General;  Laterality: Right;  . BREAST IMPLANT REMOVAL Right 06/01/2017   Procedure: EXPLANT OF RIGHT 480 MENTOR BREAST IMPLANT;  Surgeon: Wallace Going, DO;  Location: [place];  Service: Plastics;  Laterality: Right;  . BREAST RECONSTRUCTION Right 08/08/2017   Procedure: RIGHT BREAST RECONSTRUCTION WITH LATISSMA MYOCUTANEOUS FLAP AND EXPANDER PLACEMENT;  Surgeon: Wallace Going, DO;  Location: [place];  Service: Plastics;  Laterality: Right;  . BREAST RECONSTRUCTION WITH PLACEMENT OF TISSUE EXPANDER AND FLEX HD (ACELLULAR HYDRATED DERMIS) Right 09/29/2015   Procedure: BREAST RECONSTRUCTION WITH PLACEMENT OF TISSUE EXPANDER AND FLEX HD (ACELLULAR HYDRATED DERMIS);  Surgeon: Loel Lofty Dillingham, DO;  Location: [place];  Service: Plastics;  Laterality: Right;  . CERVICAL CERCLAGE  02/19/2011  Procedure: CERCLAGE CERVICAL;  Surgeon: Agnes Lawrence, MD;  Location: [facility];  Service: Gynecology;  Laterality: N/A;  . DILATION AND CURETTAGE OF UTERUS    . INCISION AND DRAINAGE OF WOUND Right 04/19/2017   Procedure: IRRIGATION OF RIGHT BREAST POCKET;  Surgeon: Wallace Going, DO;  Location: [place];  Service: Plastics;  Laterality: Right;  . LATISSIMUS FLAP TO BREAST Right 08/08/2017   Procedure: LATISSIMUS FLAP TO BREAST;  Surgeon: Wallace Going, DO;  Location: [place];  Service: Plastics;  Laterality: Right;  . MASTECTOMY Right 2017   right 2017  . MASTECTOMY WITH AXILLARY LYMPH NODE DISSECTION Right 09/29/2015  . MASTOPEXY Left 03/11/2017   Procedure: MASTOPEXY FOR SYMENTRY;  Surgeon: Wallace Going, DO;  Location: [place];  Service: Plastics;  Laterality: Left;  . PORT-A-CATH REMOVAL  10/2016  . PORTACATH PLACEMENT N/A 09/29/2015   Procedure: INSERTION PORT-A-CATH;  Surgeon: Excell Seltzer, MD;  Location: [place];  Service: General;   Laterality: N/A;  . REMOVAL OF BILATERAL TISSUE EXPANDERS WITH PLACEMENT OF BILATERAL BREAST IMPLANTS Right 03/11/2017   Procedure: REMOVAL OF RIGHT TISSUE EXPANDERS WITH PLACEMRIGHT SILICONE  BREAST IMPLANTS;  Surgeon: Wallace Going, DO;  Location: [place];  Service: Plastics;  Laterality: Right;  . REMOVAL OF TISSUE EXPANDER AND PLACEMENT OF IMPLANT Right 10/19/2017   Procedure: REMOVAL OF TISSUE EXPANDER;  Surgeon: Wallace Going, DO;  Location: [place];  Service: Plastics;  Laterality: Right;  . SIMPLE MASTECTOMY WITH AXILLARY SENTINEL NODE BIOPSY Right 09/29/2015   Procedure: RIGHT TOTAL  MASTECTOMY WITH AXILLARY SENTINEL NODE BIOPSY;  Surgeon: Excell Seltzer, MD;  Location: [place];  Service: General;  Laterality: Right;   SOCIAL HISTORY: Social History   Socioeconomic History  . Marital status: Single    Spouse name: Not on file  . Number of children: 1  . Years of education: Not on file  . Highest education level: Not on file  Occupational History  . Not on file  Social Needs  . Financial resource strain: Not on file  . Food insecurity:    Worry: Not on file    Inability: Not on file  . Transportation needs:    Medical: Not on file    Non-medical: Not on file  Tobacco Use  . Smoking status: Never Smoker  . Smokeless tobacco: Never Used  Substance and Sexual Activity  . Alcohol use: No  . Drug use: No  . Sexual activity: Yes    Birth control/protection: None  Lifestyle  . Physical activity:    Days per week: Not on file    Minutes per session: Not on file  . Stress: Not on file  Relationships  . Social connections:    Talks on phone: Not on file    Gets together: Not on file    Attends religious service: Not on file    Active member of club or organization: Not on file    Attends meetings of clubs or organizations: Not on file    Relationship status: Not on file  . Intimate partner violence:    Fear of current or ex partner: Not on  file    Emotionally abused: Not on file    Physically abused: Not on file    Forced sexual activity: Not on file  Other Topics Concern  . Not on file  Social History Narrative  . Not on file   FAMILY HISTORY: Family History  Problem Relation  Age of Onset  . Hypertension Mother   . Hypertension Father   . Prostate cancer Father 26  . Breast cancer Maternal Aunt 72  . Breast cancer Paternal Aunt 31  . Cirrhosis Maternal Uncle   . Prostate cancer Paternal Uncle   . Throat cancer Maternal Aunt        non smoker  . Prostate cancer Paternal Uncle   . Colon cancer Cousin        maternal first cousin dx in his 52s  . Lung cancer Cousin        smoker   ALLERGIES:  has No Known Allergies.  MEDICATIONS:  Current Outpatient Medications  Medication Sig Dispense Refill  . B Complex-C (SUPER B COMPLEX PO) Take 1 tablet by mouth daily.    . diazepam (VALIUM) 2 MG tablet Take 2 mg by mouth 2 (two) times daily as needed for muscle spasms.     Marland Kitchen HYDROcodone-acetaminophen (NORCO) 5-325 MG tablet Take 1 tablet by mouth every 8 (eight) hours as needed for moderate pain. 20 tablet 0  . irbesartan-hydrochlorothiazide (AVALIDE) 150-12.5 MG tablet Take 1 tablet by mouth daily.    . Iron-FA-B Cmp-C-Biot-Probiotic (FUSION PLUS PO) Take 1 tablet by mouth daily.    . ondansetron (ZOFRAN) 4 MG tablet Take 1 tablet (4 mg total) by mouth daily as needed for nausea or vomiting. 10 tablet 1  . potassium chloride SA (K-DUR,KLOR-CON) 20 MEQ tablet Take 1 tablet (20 mEq total) by mouth daily. Take 1 tab ( 20 mEq ) daily  For  7  Days. 30 tablet 0  . tamoxifen (NOLVADEX) 20 MG tablet Take 1 tablet (20 mg total) by mouth daily. 90 tablet 3   No current facility-administered medications for this visit.    Facility-Administered Medications Ordered in Other Visits  Medication Dose Route Frequency Provider Last Rate Last Dose  . 0.9 %  sodium chloride infusion   Intravenous Once Truitt Merle, MD      . alteplase  (CATHFLO ACTIVASE) injection 2 mg  2 mg Intracatheter Once PRN Truitt Merle, MD      . heparin lock flush 100 unit/mL  250 Units Intracatheter Once PRN Truitt Merle, MD      . heparin lock flush 100 unit/mL  500 Units Intravenous Once PRN Truitt Merle, MD      . sodium chloride 0.9 % injection 10 mL  10 mL Intracatheter PRN Truitt Merle, MD      . sodium chloride 0.9 % injection 10 mL  10 mL Intravenous PRN Truitt Merle, MD      . sodium chloride 0.9 % injection 3 mL  3 mL Intravenous Once PRN Truitt Merle, MD      . sodium chloride flush (NS) 0.9 % injection 10 mL  10 mL Intravenous PRN Truitt Merle, MD        REVIEW OF SYSTEMS:  Constitutional: Denies fevers, chills (+) better appetite  Eyes:Denies blurry vision, double vision or watery eyes Ears, nose, mouth, throat, and face: Denies mucositis or sore throat Respiratory: Denies cough, dyspnea or wheezes  Cardiovascular: Denies palpitation, chest discomfort or lower extremity swelling Gastrointestinal:  Denies nausea, heartburn  Skin: Denies abnormal skin rashes Lymphatics: Denies new lymphadenopathy or easy bruising  Neurological:Denies numbness, tingling or new weaknesses  Behavioral/Psych: Mood is stable, no new changes  Breast: No new masses, nipple retraction or discharge All other systems were reviewed with the patient and are negative.  PHYSICAL EXAMINATION:  ECOG PERFORMANCE STATUS: 0  Vitals:   01/16/18 1421  BP: 115/83  Pulse: 91  Resp: 18  Temp: 98.2 F (36.8 C)  SpO2: 100%   Filed Weights   01/16/18 1421  Weight: 129 lb 6.4 oz (58.7 kg)     GENERAL:alert, no distress and comfortable SKIN: skin color, texture, turgor are normal, Several scattered acne-like rash in her upper front chest.  EYES: normal, conjunctiva are pink and non-injected, sclera clear OROPHARYNX:no exudate, no erythema and lips, buccal mucosa, and tongue normal  NECK: supple, thyroid normal size, non-tender, without nodularity LYMPH:  no palpable  lymphadenopathy in the cervical, axillary or inguinal LUNGS: clear to auscultation and percussion with normal breathing effort HEART: regular rate & rhythm and no murmurs and no lower extremity edema ABDOMEN:abdomen soft, non-tender and normal bowel sounds Musculoskeletal:no cyanosis of digits and no clubbing  PSYCH: alert & oriented x 3 with fluent speech NEURO: no focal motor/sensory deficits Breasts:  Status post right mastectomy (+) surgical incison has healed. No erythema, discharge or palpable mass. Palpation of the left breast and bilateral axilla showed no palpable mass.    LABORATORY DATA:  I have reviewed the data as listed CBC Latest Ref Rng & Units 01/16/2018 12/16/2017 11/29/2017  WBC 3.9 - 10.3 K/uL 5.5 7.3 5.7  Hemoglobin 11.6 - 15.9 g/dL 12.4 12.2 11.9  Hematocrit 34.8 - 46.6 % 36.6 36.8 37.1  Platelets 145 - 400 K/uL 266 256 234    CMP Latest Ref Rng & Units 12/16/2017 11/29/2017 10/19/2017  Glucose 70 - 99 mg/dL 99 119(H) 90  BUN 6 - 20 mg/dL _0 Creatinine 0.44 - 1.00 mg/dL 0.92 1.03(H) 1.00  Sodium 135 - 145 mmol/L 140 140 140  Potassium 3.5 - 5.1 mmol/L 3.8 3.9 4.2  Chloride 98 - 111 mmol/L 103 108 105  CO2 22 - 32 mmol/L _1 Calcium 8.9 - 10.3 mg/dL 10.3 9.4 9.4  Total Protein 6.5 - 8.1 g/dL 7.7 7.8 -  Total Bilirubin 0.3 - 1.2 mg/dL 0.3 0.2(L) -  Alkaline Phos 38 - 126 U/L 46 53 -  AST 15 - 41 U/L 22 22 -  ALT 0 - 44 U/L 12 15 -    PATHOLOGY:  Diagnosis 03/11/17 1. Breast, capsule, right inferior BENIGN FIBROVASCULAR TISSUE 2. Breast, Mammoplasty, left BENIGN BREAST TISSUE NEGATIVE FOR ATYPIA OR MALIGNANCY  Diagnosis 09/29/2015 1. Lymph node, sentinel, biopsy, Right axillary #1 - ONE BENIGN LYMPH NODE WITH NO TUMOR SEEN (0/1). 2. Lymph node, sentinel, biopsy, Right axillary #2 - ONE LYMPH NODE POSITIVE FOR METASTATIC MAMMARY CARCINOMA (1/1). 3. Lymph node, sentinel, biopsy, Right axillary #4 - ONE BENIGN LYMPH NODE WITH NO TUMOR SEEN  (0/1). 4. 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 5. 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 - LARGEST FOCUS OF INVASIVE MAMMARY CARCINOMA IS GRADE 2, SHOWS ASSOCIATED LOBULAR CARCINOMA IN SITU AND MEASURES 13 CM IN GREATEST DIMENSION. - SECOND LARGEST FOCUS SHOWS INVASIVE GRADE 3 MAMMARY CARCINOMA AND SPANS 2.1 CM IN GREATEST DIMENSION. - THIRD LARGEST FOCUS SHOWS INVASIVE GRADE 2 MAMMARY CARCINOMA WITH LOBULAR CARCINOMA IN SITU AND MEASURES 1.9 CM IN GREATEST DIMENSION. - SMALLEST FOCUS SHOWS INVASIVE GRADE 3 MAMMARY CARCINOMA AND MEASURES 0.5 CM IN GREATEST DIMENSION. - ANTERIOR INFERIOR SOFT TISSUE MARGIN DEMONSTRATES BROAD POSITIVITY FOR INVASIVE MAMMARY CARCINOMA. - OTHER MARGINS ARE NEGATIVE. - SEE ONCOLOGY TEMPLATE. 6. Lymph  node, sentinel, biopsy, Right axillary #3 - ONE BENIGN LYMPH NODE WITH NO TUMOR SEEN (0/1). 7. Lymph node, sentinel, biopsy, Right axillary #6 - ONE LYMPH NODE WITH ISOLATED TUMOR CELLS. 8. Lymph nodes, regional resection, Right axillary contents - ELEVEN BENIGN LYMPH NODES WITH NO TUMOR SEEN (0/11). Specimen, including laterality and lymph node sampling (sentinel, non-sentinel): Right breast with right sentinel and non-sentinel lymph nodes. Procedure: Right modified mastectomy with sentinel lymph node biopsy and axillary dissection. Histologic type: Multifocal mammary carcinoma with ductal and lobular features. Largest focus (13 cm): Grade: 2. Tubule formation: 3. Nuclear pleomorphism: 2. Mitotic: 1. Second largest focus (2.1 cm): Grade: 3. Tubule formation: 3. Nuclear pleomorphism: 2. Mitotic: 3. Third largest focus (1.9 cm): Grade: 2. Tubule formation: 3. Nuclear pleomorphism: 2. Mitotic: 1. Smallest focus (0.5 cm): Grade: 3. Tubule formation: 3. Nuclear pleomorphism:  2. Mitotic: 3. Tumor size (gross measurements): 13 cm, 2.1 cm, 1.9 cm and 0.5 cm. Margins: Invasive, distance to closest margin: Invasive mammary carcinoma broadly involves the anterior inferior soft tissue margin. In-situ, distance to closest margin: Margin uninvolved by in situ carcinoma. If margin positive, focally or broadly: Broadly positive. Lymphovascular invasion: Yes, identified. Ductal carcinoma in situ: Not identified. Grade: Not applicable. Extensive intraductal component: Not applicable. Lobular neoplasia: Yes, lobular carcinoma in situ is present. Tumor focality: Multifocal. Treatment effect: Not applicable. If present, treatment effect in breast tissue, lymph nodes or both: Not applicable. Extent of tumor: Skin: Not involved. Nipple: Not involved. Skeletal muscle: Not received. Lymph nodes: Examined: 6 Sentinel. 11 Non-sentinel. 17 Total. Lymph nodes with metastasis: 2 with metastasis and 1 with isolated tumor cells. Isolated tumor cells (< 0.2 mm): 1. Micrometastasis: (> 0.2 mm and < 2.0 mm): 0. Macrometastasis: (> 2.0 mm): 2. Extracapsular extension: Not identified.  Breast prognostic profile: Performed on previous case, [id number]. Right 8 o'clock biopsy 12 cm from the nipple: Estrogen receptor: 95%, positive. Progesterone receptor: 95%, positive. Her-2 neu: 2.04 ratio, positive. Ki-67: 30%. Right breast needle core biopsy 6:30 o'clock position 1 cm from the nipple: Estrogen receptor: 90%, positive. Progesterone receptor: 95%, positive. Her-2 neu: 1.39 ratio, negative. Ki-67: 10%. Non-neoplastic breast: No significant non-neoplastic breast findings.  TNM: pT3(m), pN1a. Comments: A cytokeratin AE1/AE3 immunohistochemical stain is performed on eight blocks containing lymph node tissue (eight stains total). The morphology coupled with the staining pattern is consistent with the above findings. An E-cadherin immunohistochemical stain is also performed on  four blocks from the right simple mastectomy specimen. The staining pattern coupled with the morphology is consistent with the above findings. As there was Her-2 neu positivity in one of the biopsies and both biopsies were positive for estrogen receptor, a repeat breast prognostic profile will not be performed unless otherwise requested. Dr. Lyndon Code has seen selected slides 5E, 70F, 5G, 5J, 5N, 5O, and 5P with agreement that the tumor is a mulitfocal invasive mammary carcinoma with a mixed ductal and lobular phenotype. Dr. Tresa Moore has also seen a selected slide (5P) with agreement of the positive anterior inferior margin. (RH:ecj 10/03/2015)   RADIOGRAPHIC STUDIES: I have personally reviewed the radiological images as listed and agreed with the findings in the report. No new scans  11/14/2017 Mammogram IMPRESSION: No mammographic evidence of malignancy. A result letter of this screening mammogram will be mailed directly to the patient.  CT chest, abdomen and pelvis  10/27/2015 IMPRESSION: Status post right mastectomy and right axillary lymph node Dissection. No findings specific for metastatic disease. Scattered hepatic cysts measuring up to 12 mm.  A single 8 mm hypoenhancing lesion is technically indeterminate, but also likely benign, although motion degraded. Consider attention on follow-up.  Bone scan 10/28/2015 IMPRESSION: Small focus of increased radiotracer uptake within the right lateral skull at the region of the right temporal bone. CT of the head may be considered for further evaluation. Otherwise no evidence of osseous metastatic disease in the axial skeleton.  CT head with and without contrast 11/07/2015 IMPRESSION: 1. Indeterminate 10-11 mm area of heterogeneous bone mineralization along the right sphenoid wing which may correspond to the recent bone scan finding. This might be physiologic. A Repeat CT head (e.g. in 8-12 weeks, Noncontrast should suffice but with and without  contrast could be done if repeat brain staging is desired) may be most valuable to evaluate stability. 2. No CT evidence of metastatic disease to the brain. Mild nonspecific white matter changes.  Echo 04/10/2016 Impressions: - Normal LV size with EF 65-70%. Strain as above. Normal RV size and systolic function. No significant valvular abnormalities.  Mammogram 10/17/2016 IMPRESSION: No mammographic evidence of malignancy. A result letter of this screening mammogram will be mailed directly to the patient.  ASSESSMENT & PLAN:  42 y.o. African-American female, premenopausal, presented with a palpable right breast mass.  1. 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 Her initial biopsy showed one triple positive disease, the other was ER/PR positive and HER2 Negative disease. -Her staging scans were negative for distant metastasis. -We previously discussed that HER-2 positive breast cancer are more aggressive, with a high risk for recurrence after surgery, especially in the setting of locally advanced stage.  -she completed adjuvant chemotherapy TCHP (docetaxel, carboplatin, Herceptin, pejeta) every 3 weeks for total of 6 cycles, followed by Herceptin maintenance therapy to complete 1 year treatment. -She underwent adjuvant radiation therapy by Dr. Sondra Come 03/07/16-04/27/2016. Tolerated well. -She started adjuvant antiestrogen therapy with tamoxifen on 05/21/16, tolerating well overall, with moderate hot flashes that have now resolved. -she completed one year adjuvant Neratinib on 11/09/2017 -She had breast reconstruction by Dr. Marla Roe on 03/11/17. She is healing well and plans to  return for another surgery early next year.  -She was scheduled for reconstruction surgery on 08/08/17 with Dr. Marla Roe. She opted for reconstruction with TRAM flap. Her implant was finally removed due to multiple infection. - Mammogram on 09/2017 was benign -Her [place] level shows she is still pre-menopausal, I recommended ovarian suppression.  She started Zoladex injection in 11/2017 and tolerated well  -I discussed switching Tamoxifen to Letrozole today and discussed side effects, especially osteoarthritis and osteoporosis.  He voiced good understanding, and agrees to proceed. -She is clinically doing well. Lab reviewed, her CBC WNLs CMP pending. Her breast exam was unremarkable. There is no clinical concern for recurrence. -She has attended survivorship clinic. -f/u in 3 months     2. Iron deficient anemia, and anemia of chronic disease  -Her lab tests showed a mild to moderate anemia. -Her anemia previously resolved after IV Feraheme, slightly worse after she started chemotherapy. -I previously encouraged her to continue oral iron pill -Her repeated iron level and a ferritin normalized.  -She has required blood transfusion in the past - Her menstrual cycle has returned. I previously encouraged her to take  her iron pills twice daily for the next 3 months then once daily. -She has not had a menstrual.  For over a year now. -12/07/16 iron studies showed normal ferritin, low TIBC, consistent with anemia of chronic disease  -Anemia resolved now, repeat iron study was normal previously.   3. Genetic -Given her young age and positive family history, we strongly recommend her to [facility] genetic counseling for genetic testing to ruled out inheritable breast cancer syndrome. She agrees. -Her genetic testing was normal  4.  HTN -She was noticed to have significant elevated blood pressure previously in clinic -She has previously seen her primary care physician, and her blood pressure medication has been  adjusted, including HCTZ  5. Right arm lymphedema  -Secondary to breast surgery and lymph node biopsy -improved  -She is using compression cast for her right arm. -She has a pump from PT and she says it swells more on the pump, will F/u with PT for more help -Continue with compression sleeves and manual pump.   6. Hot flashes and mood swings. -The Effexor made her feel loopy and she has stopped taking it. -I previously started her on Neurontin for her nocturnal hot flash and peripheral neuropathy, but she only took it once due to feeling loopy. -Hot flashes have near resolved    Plan -Prescribed Letrozole today. Will start next week -Zoladex injection today and continue monthly -flu shot today  -f/u in 3 months   I spent 20 minutes counseling the patient face to face. The total time spent in the appointment was 25 minutes and more than 50% was on counseling.   Dierdre Searles Dweik am acting as scribe for Dr. Truitt Merle.  I have reviewed the above documentation for accuracy and completeness, and I agree with the above.    Truitt Merle, MD 01/16/2018

## 2018-01-16 ENCOUNTER — Inpatient Hospital Stay: Payer: BLUE CROSS/BLUE SHIELD

## 2018-01-16 ENCOUNTER — Inpatient Hospital Stay (HOSPITAL_BASED_OUTPATIENT_CLINIC_OR_DEPARTMENT_OTHER): Payer: BLUE CROSS/BLUE SHIELD | Admitting: Hematology

## 2018-01-16 ENCOUNTER — Inpatient Hospital Stay: Payer: BLUE CROSS/BLUE SHIELD | Attending: Hematology

## 2018-01-16 ENCOUNTER — Telehealth: Payer: Self-pay

## 2018-01-16 ENCOUNTER — Encounter: Payer: Self-pay | Admitting: Hematology

## 2018-01-16 VITALS — BP 115/83 | HR 91 | Temp 98.2°F | Resp 18 | Ht 63.0 in | Wt 129.4 lb

## 2018-01-16 VITALS — BP 120/83 | HR 74 | Temp 97.9°F | Resp 18

## 2018-01-16 DIAGNOSIS — Z23 Encounter for immunization: Secondary | ICD-10-CM

## 2018-01-16 DIAGNOSIS — Z5111 Encounter for antineoplastic chemotherapy: Secondary | ICD-10-CM | POA: Diagnosis not present

## 2018-01-16 DIAGNOSIS — I10 Essential (primary) hypertension: Secondary | ICD-10-CM | POA: Insufficient documentation

## 2018-01-16 DIAGNOSIS — D509 Iron deficiency anemia, unspecified: Secondary | ICD-10-CM

## 2018-01-16 DIAGNOSIS — Z170 Estrogen receptor positive status [ER+]: Secondary | ICD-10-CM | POA: Insufficient documentation

## 2018-01-16 DIAGNOSIS — D500 Iron deficiency anemia secondary to blood loss (chronic): Secondary | ICD-10-CM

## 2018-01-16 DIAGNOSIS — C50511 Malignant neoplasm of lower-outer quadrant of right female breast: Secondary | ICD-10-CM | POA: Diagnosis present

## 2018-01-16 DIAGNOSIS — Z79810 Long term (current) use of selective estrogen receptor modulators (SERMs): Secondary | ICD-10-CM | POA: Diagnosis not present

## 2018-01-16 LAB — CBC WITH DIFFERENTIAL/PLATELET
Basophils Absolute: 0 10*3/uL (ref 0.0–0.1)
Basophils Relative: 1 %
Eosinophils Absolute: 0.2 10*3/uL (ref 0.0–0.5)
Eosinophils Relative: 4 %
HCT: 36.6 % (ref 34.8–46.6)
Hemoglobin: 12.4 g/dL (ref 11.6–15.9)
Lymphocytes Relative: 45 %
Lymphs Abs: 2.5 10*3/uL (ref 0.9–3.3)
MCH: 30.7 pg (ref 25.1–34.0)
MCHC: 33.9 g/dL (ref 31.5–36.0)
MCV: 90.7 fL (ref 79.5–101.0)
Monocytes Absolute: 0.4 10*3/uL (ref 0.1–0.9)
Monocytes Relative: 8 %
Neutro Abs: 2.3 10*3/uL (ref 1.5–6.5)
Neutrophils Relative %: 42 %
Platelets: 266 10*3/uL (ref 145–400)
RBC: 4.04 MIL/uL (ref 3.70–5.45)
RDW: 12.6 % (ref 11.2–14.5)
WBC: 5.5 10*3/uL (ref 3.9–10.3)

## 2018-01-16 LAB — COMPREHENSIVE METABOLIC PANEL
ALT: 14 U/L (ref 0–44)
AST: 23 U/L (ref 15–41)
Albumin: 4.4 g/dL (ref 3.5–5.0)
Alkaline Phosphatase: 46 U/L (ref 38–126)
Anion gap: 8 (ref 5–15)
BUN: 14 mg/dL (ref 6–20)
CO2: 31 mmol/L (ref 22–32)
Calcium: 10.2 mg/dL (ref 8.9–10.3)
Chloride: 101 mmol/L (ref 98–111)
Creatinine, Ser: 0.98 mg/dL (ref 0.44–1.00)
GFR calc Af Amer: >60 mL/min (ref >60)
GFR calc non Af Amer: >60 mL/min (ref >60)
Glucose, Bld: 101 mg/dL — ABNORMAL HIGH (ref 70–99)
Potassium: 3.3 mmol/L — ABNORMAL LOW (ref 3.5–5.1)
Sodium: 140 mmol/L (ref 135–145)
Total Bilirubin: 0.5 mg/dL (ref 0.3–1.2)
Total Protein: 7.9 g/dL (ref 6.5–8.1)

## 2018-01-16 MED ORDER — LETROZOLE 2.5 MG PO TABS: 2.5000 mg | ORAL_TABLET | Freq: Every day | ORAL | 3 refills | Status: DC

## 2018-01-16 MED ORDER — INFLUENZA VAC SPLIT QUAD 0.5 ML IM SUSY
0.5000 mL | PREFILLED_SYRINGE | Freq: Once | INTRAMUSCULAR | Status: AC
  Administered 2018-01-16: 0.5 mL via INTRAMUSCULAR

## 2018-01-16 MED ORDER — GOSERELIN ACETATE 3.6 MG ~~LOC~~ IMPL
DRUG_IMPLANT | SUBCUTANEOUS | Status: AC
  Filled 2018-01-16: qty 3.6

## 2018-01-16 MED ORDER — INFLUENZA VAC SPLIT QUAD 0.5 ML IM SUSY
PREFILLED_SYRINGE | INTRAMUSCULAR | Status: AC
  Filled 2018-01-16: qty 0.5

## 2018-01-16 MED ORDER — GOSERELIN ACETATE 3.6 MG ~~LOC~~ IMPL
3.6000 mg | DRUG_IMPLANT | Freq: Once | SUBCUTANEOUS | Status: AC
  Administered 2018-01-16: 3.6 mg via SUBCUTANEOUS

## 2018-01-16 NOTE — Telephone Encounter (Signed)
Printed avs and calender of upcoming appointment. Per 9/26 los 

## 2018-01-16 NOTE — Patient Instructions (Signed)
Goserelin injection What is this medicine? GOSERELIN (GOE se rel in) is similar to a hormone found in the body. It lowers the amount of sex hormones that the body makes. Men will have lower testosterone levels and women will have lower estrogen levels while taking this medicine. In men, this medicine is used to treat prostate cancer; the injection is either given once per month or once every 12 weeks. A once per month injection (only) is used to treat women with endometriosis, dysfunctional uterine bleeding, or advanced breast cancer. This medicine may be used for other purposes; ask your health care provider or pharmacist if you have questions. COMMON BRAND NAME(S): Zoladex What should I tell my health care provider before I take this medicine? They need to know if you have any of these conditions (some only apply to women): -diabetes -heart disease or previous heart attack -high blood pressure -high cholesterol -kidney disease -osteoporosis or low bone density -problems passing urine -spinal cord injury -stroke -tobacco smoker -an unusual or allergic reaction to goserelin, hormone therapy, other medicines, foods, dyes, or preservatives -pregnant or trying to get pregnant -breast-feeding How should I use this medicine? This medicine is for injection under the skin. It is given by a health care professional in a hospital or clinic setting. Men receive this injection once every 4 weeks or once every 12 weeks. Women will only receive the once every 4 weeks injection. Talk to your pediatrician regarding the use of this medicine in children. Special care may be needed. Overdosage: If you think you have taken too much of this medicine contact a poison control center or emergency room at once. NOTE: This medicine is only for you. Do not share this medicine with others. What if I miss a dose? It is important not to miss your dose. Call your doctor or health care professional if you are unable to  keep an appointment. What may interact with this medicine? -female hormones like estrogen -herbal or dietary supplements like black cohosh, chasteberry, or DHEA -female hormones like testosterone -prasterone This list may not describe all possible interactions. Give your health care provider a list of all the medicines, herbs, non-prescription drugs, or dietary supplements you use. Also tell them if you smoke, drink alcohol, or use illegal drugs. Some items may interact with your medicine. What should I watch for while using this medicine? Visit your doctor or health care professional for regular checks on your progress. Your symptoms may appear to get worse during the first weeks of this therapy. Tell your doctor or healthcare professional if your symptoms do not start to get better or if they get worse after this time. Your bones may get weaker if you take this medicine for a long time. If you smoke or frequently drink alcohol you may increase your risk of bone loss. A family history of osteoporosis, chronic use of drugs for seizures (convulsions), or corticosteroids can also increase your risk of bone loss. Talk to your doctor about how to keep your bones strong. This medicine should stop regular monthly menstration in women. Tell your doctor if you continue to menstrate. Women should not become pregnant while taking this medicine or for 12 weeks after stopping this medicine. Women should inform their doctor if they wish to become pregnant or think they might be pregnant. There is a potential for serious side effects to an unborn child. Talk to your health care professional or pharmacist for more information. Do not breast-feed an infant while taking   this medicine. Men should inform their doctors if they wish to father a child. This medicine may lower sperm counts. Talk to your health care professional or pharmacist for more information. What side effects may I notice from receiving this  medicine? Side effects that you should report to your doctor or health care professional as soon as possible: -allergic reactions like skin rash, itching or hives, swelling of the face, lips, or tongue -bone pain -breathing problems -changes in vision -chest pain -feeling faint or lightheaded, falls -fever, chills -pain, swelling, warmth in the leg -pain, tingling, numbness in the hands or feet -signs and symptoms of low blood pressure like dizziness; feeling faint or lightheaded, falls; unusually weak or tired -stomach pain -swelling of the ankles, feet, hands -trouble passing urine or change in the amount of urine -unusually high or low blood pressure -unusually weak or tired Side effects that usually do not require medical attention (report to your doctor or health care professional if they continue or are bothersome): -change in sex drive or performance -changes in breast size in both males and females -changes in emotions or moods -headache -hot flashes -irritation at site where injected -loss of appetite -skin problems like acne, dry skin -vaginal dryness This list may not describe all possible side effects. Call your doctor for medical advice about side effects. You may report side effects to FDA at 1-800-FDA-1088. Where should I keep my medicine? This drug is given in a hospital or clinic and will not be stored at home. NOTE: This sheet is a summary. It may not cover all possible information. If you have questions about this medicine, talk to your doctor, pharmacist, or health care provider.  2018 [employer]vier/Gold Standard (2013-06-16 11:10:35)  

## 2018-01-17 ENCOUNTER — Encounter: Payer: Self-pay | Admitting: Hematology

## 2018-01-18 ENCOUNTER — Other Ambulatory Visit: Payer: Self-pay | Admitting: Hematology

## 2018-01-18 DIAGNOSIS — E876 Hypokalemia: Secondary | ICD-10-CM

## 2018-01-18 MED ORDER — POTASSIUM CHLORIDE CRYS ER 20 MEQ PO TBCR: 20.0000 meq | EXTENDED_RELEASE_TABLET | Freq: Every day | ORAL | 0 refills | Status: DC

## 2018-01-20 ENCOUNTER — Telehealth: Payer: Self-pay

## 2018-01-20 NOTE — Telephone Encounter (Signed)
Left voice message for patient that her potassium is a little low, Dr. Burr Medico refilled KCL 20 meq take one daily for 7 days and eat foods rich in potassium.

## 2018-01-20 NOTE — Telephone Encounter (Signed)
-----   Message from Truitt Merle, MD sent at 01/18/2018  6:46 PM EDT ----- Please let her know her K was slightly low, and I have refilled KCL 39meq daily for 7days, and have K rich food, thanks  Truitt Merle  01/18/2018

## 2018-01-23 IMAGING — CT CT CHEST W/ CM
2 of 5 series · 15 of 36 positions shown, 18 images · IV contrast (ISOVUE 300)
Comparison: None.

CLINICAL DATA: History of right breast cancer diagnosed [DATE],
status post mastectomy, chemotherapy to start

EXAM:
CT CHEST, ABDOMEN, AND PELVIS WITH CONTRAST
TECHNIQUE: Multidetector CT imaging of the chest, abdomen and pelvis was
performed following the standard protocol during bolus
administration of intravenous contrast.
CONTRAST:  100mL SO3EGD-IHH IOPAMIDOL (SO3EGD-IHH) INJECTION 61%

[Series 2: cap w · axial · 0.67mm/px · z∈[-580,-35]mm · 12 of 125 slices shown, 15 images]
[im 8/125  mediastinal]
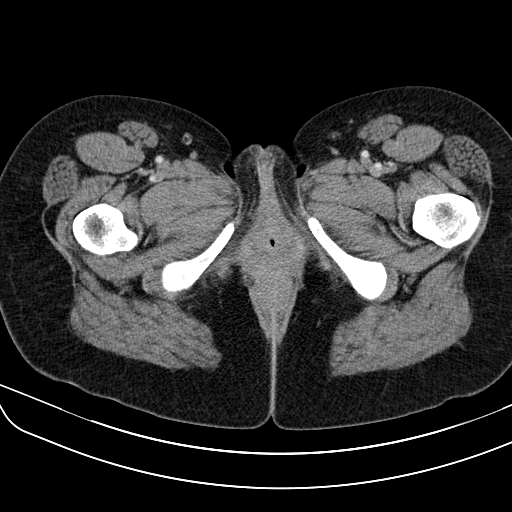
[im 8/125  lung]
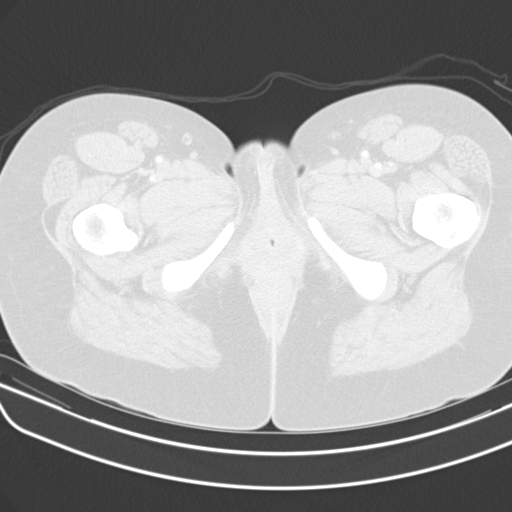
[im 16/125  lung]
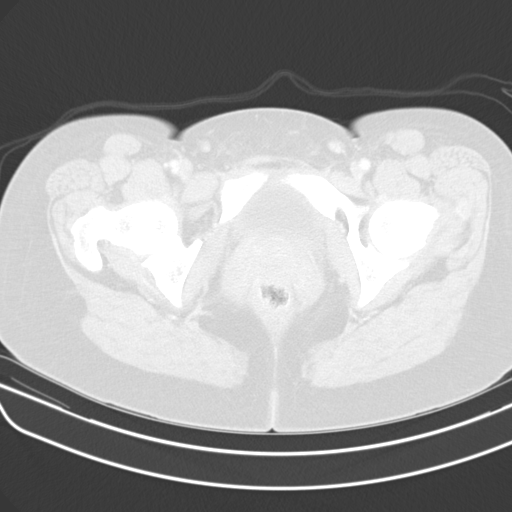
[im 32/125  lung]
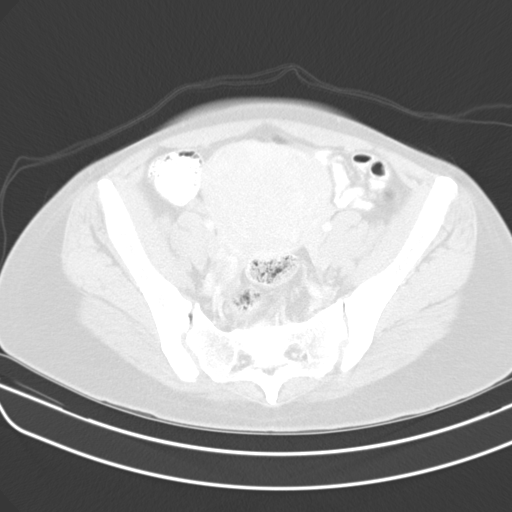
[im 39/125  lung]
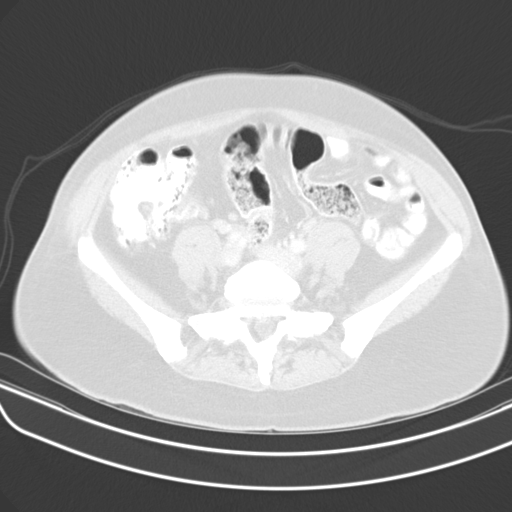
[im 47/125  mediastinal]
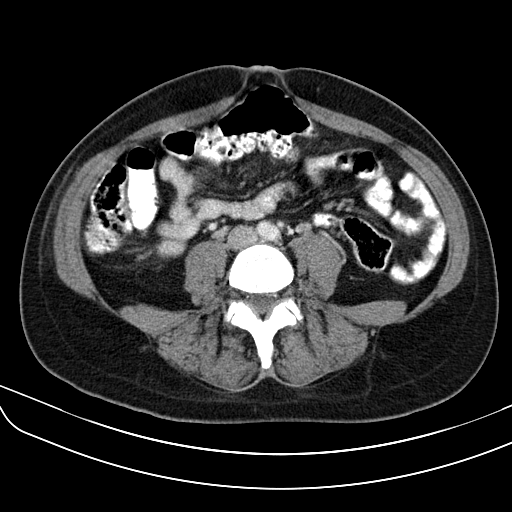
[im 47/125  lung]
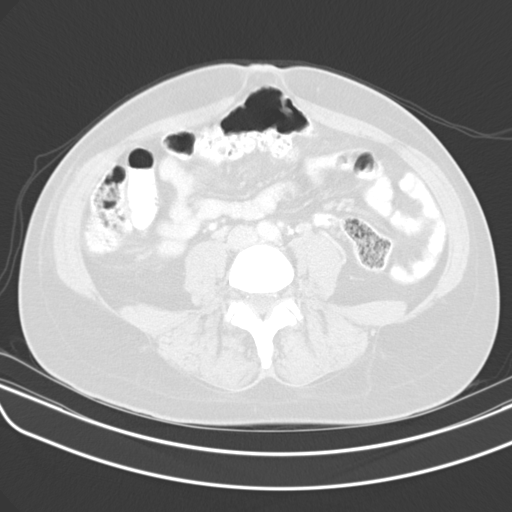
[im 55/125  lung]
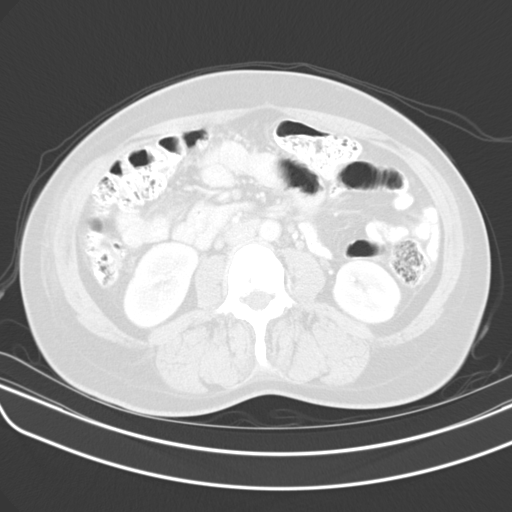
[im 70/125  lung]
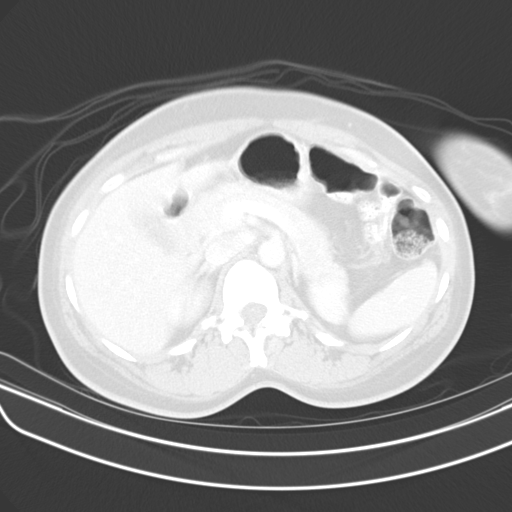
[im 78/125  lung]
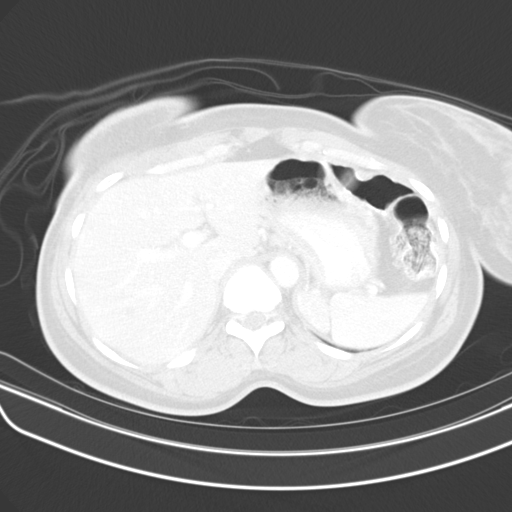
[im 86/125  mediastinal]
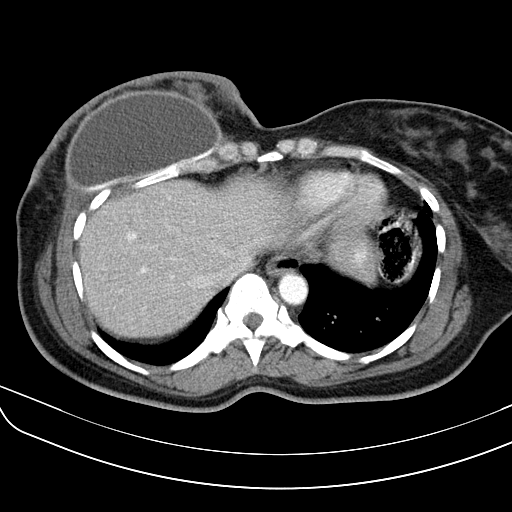
[im 86/125  lung]
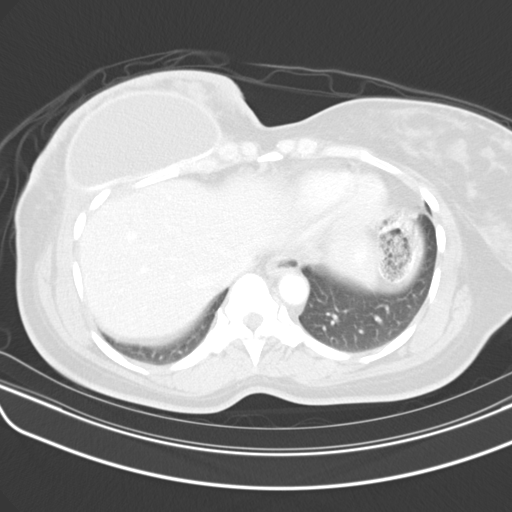
[im 94/125  lung]
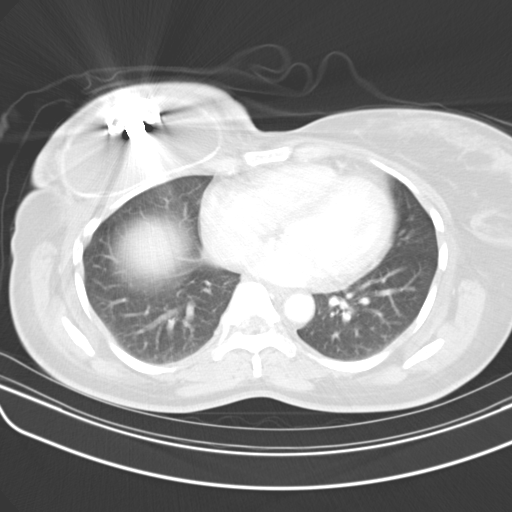
[im 109/125  lung]
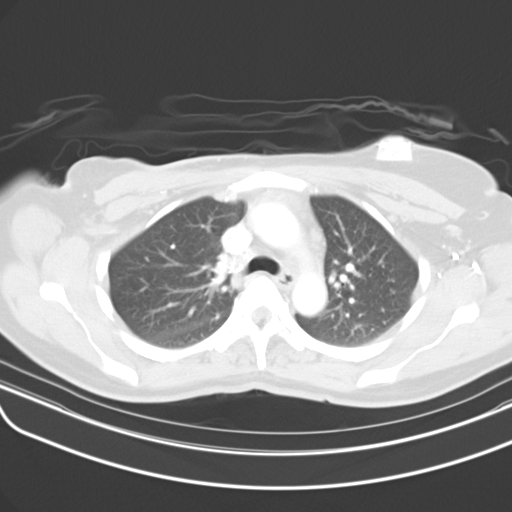
[im 117/125  lung]
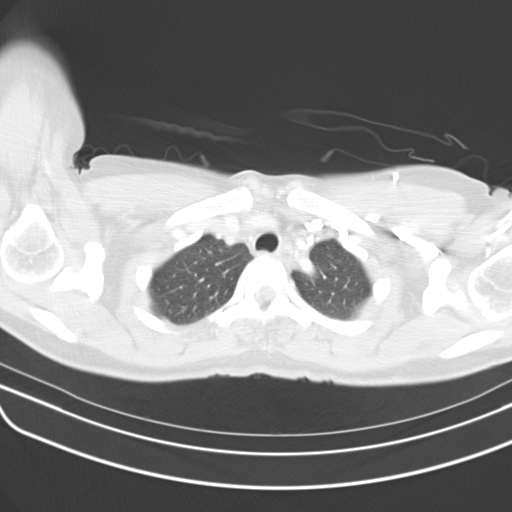

[Series 603: <mpr thick range> · coronal · 1.22mm/px · 3 of 115 slices shown]
[im 23/115  lung]
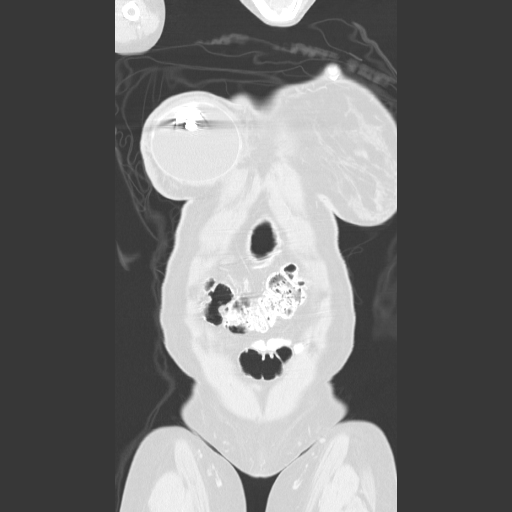
[im 46/115  lung]
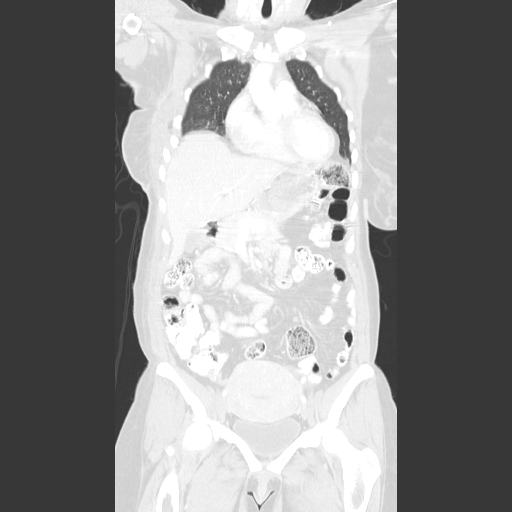
[im 69/115  lung]
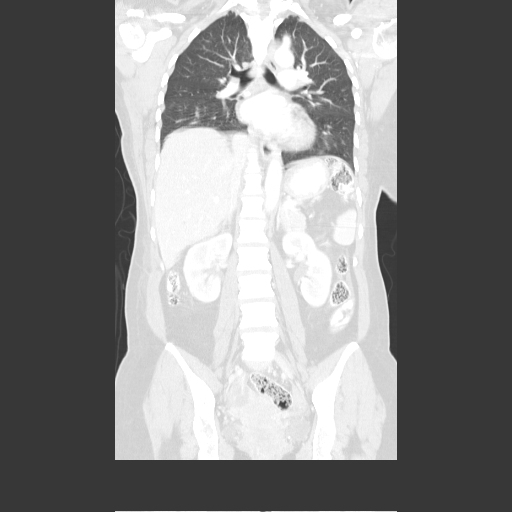

[15 of 36 positions shown; findings below may reference images not displayed]

FINDINGS: Motion degraded images.

CT CHEST FINDINGS

Cardiovascular: Heart is normal in size.  No pericardial effusion.

No evidence of thoracic aortic aneurysm.

Mediastinum/Nodes: No suspicious mediastinal lymphadenopathy.

Status post right axillary lymph node dissection.

Left chest port, terminating in the lower SVC.

Lungs/Pleura: No suspicious pulmonary nodules.

No focal consolidation.

No pleural effusion or pneumothorax.

Musculoskeletal: Status post right mastectomy with soft tissue
expander.

Skin thickening overlying the inferior right breast (series 2/ image
41).

No focal osseous lesions.

CT ABDOMEN PELVIS FINDINGS

Hepatobiliary: 8 mm hypoenhancing lesion in segment 8 (series
2/image 39), indeterminate. Additional scattered probable cysts,
including a 12 mm cyst in the posterior right hepatic dome (series
2/ image 34) and a 9 mm cyst in segment 7 (series 2/ image 47).

Gallbladder is unremarkable. No intrahepatic or extrahepatic ductal
dilatation.

Pancreas: Within normal limits.

Spleen: Within normal limits.

Adrenals/Urinary Tract: Adrenal glands are within normal limits.

Kidneys are within normal limits.  No hydronephrosis.

Bladder is within normal limits.

Stomach/Bowel: Stomach is within normal limits.

No evidence of bowel obstruction.

Normal appendix (series 2/image 91).

Vascular/Lymphatic: No evidence of abdominal aortic aneurysm.

No suspicious abdominopelvic lymphadenopathy. Small metallic nodes
measuring up to 7 mm.

Reproductive: Uterus is mildly enlarged/ heterogeneous, possibly
reflecting adenomyosis, less likely uterine fibroids.

Bilateral ovaries are within normal limits, noting a right corpus
luteal cyst (series 2/ image 101).

Other: Small volume right pelvic ascites (series 2/ image 102),
likely physiologic.

Musculoskeletal: No focal osseous lesions.
IMPRESSION: Status post right mastectomy and right axillary lymph node
dissection.

No findings specific for metastatic disease.

Scattered hepatic cysts measuring up to 12 mm. A single 8 mm
hypoenhancing lesion is technically indeterminate, but also likely
benign, although motion degraded. Consider attention on follow-up.

## 2018-02-01 ENCOUNTER — Other Ambulatory Visit: Payer: Self-pay | Admitting: Nurse Practitioner

## 2018-02-13 ENCOUNTER — Inpatient Hospital Stay: Payer: BLUE CROSS/BLUE SHIELD | Attending: Hematology

## 2018-02-13 VITALS — BP 110/80 | HR 75 | Temp 98.4°F | Resp 16

## 2018-02-13 DIAGNOSIS — D500 Iron deficiency anemia secondary to blood loss (chronic): Secondary | ICD-10-CM

## 2018-02-13 DIAGNOSIS — Z5111 Encounter for antineoplastic chemotherapy: Secondary | ICD-10-CM | POA: Diagnosis not present

## 2018-02-13 DIAGNOSIS — C50511 Malignant neoplasm of lower-outer quadrant of right female breast: Secondary | ICD-10-CM | POA: Insufficient documentation

## 2018-02-13 MED ORDER — GOSERELIN ACETATE 3.6 MG ~~LOC~~ IMPL
DRUG_IMPLANT | SUBCUTANEOUS | Status: AC
  Filled 2018-02-13: qty 3.6

## 2018-02-13 MED ORDER — GOSERELIN ACETATE 3.6 MG ~~LOC~~ IMPL
3.6000 mg | DRUG_IMPLANT | Freq: Once | SUBCUTANEOUS | Status: AC
  Administered 2018-02-13: 3.6 mg via SUBCUTANEOUS

## 2018-02-13 NOTE — Patient Instructions (Signed)
Goserelin injection What is this medicine? GOSERELIN (GOE se rel in) is similar to a hormone found in the body. It lowers the amount of sex hormones that the body makes. Men will have lower testosterone levels and women will have lower estrogen levels while taking this medicine. In men, this medicine is used to treat prostate cancer; the injection is either given once per month or once every 12 weeks. A once per month injection (only) is used to treat women with endometriosis, dysfunctional uterine bleeding, or advanced breast cancer. This medicine may be used for other purposes; ask your health care provider or pharmacist if you have questions. COMMON BRAND NAME(S): Zoladex What should I tell my health care provider before I take this medicine? They need to know if you have any of these conditions (some only apply to women): -diabetes -heart disease or previous heart attack -high blood pressure -high cholesterol -kidney disease -osteoporosis or low bone density -problems passing urine -spinal cord injury -stroke -tobacco smoker -an unusual or allergic reaction to goserelin, hormone therapy, other medicines, foods, dyes, or preservatives -pregnant or trying to get pregnant -breast-feeding How should I use this medicine? This medicine is for injection under the skin. It is given by a health care professional in a hospital or clinic setting. Men receive this injection once every 4 weeks or once every 12 weeks. Women will only receive the once every 4 weeks injection. Talk to your pediatrician regarding the use of this medicine in children. Special care may be needed. Overdosage: If you think you have taken too much of this medicine contact a poison control center or emergency room at once. NOTE: This medicine is only for you. Do not share this medicine with others. What if I miss a dose? It is important not to miss your dose. Call your doctor or health care professional if you are unable to  keep an appointment. What may interact with this medicine? -female hormones like estrogen -herbal or dietary supplements like black cohosh, chasteberry, or DHEA -female hormones like testosterone -prasterone This list may not describe all possible interactions. Give your health care provider a list of all the medicines, herbs, non-prescription drugs, or dietary supplements you use. Also tell them if you smoke, drink alcohol, or use illegal drugs. Some items may interact with your medicine. What should I watch for while using this medicine? Visit your doctor or health care professional for regular checks on your progress. Your symptoms may appear to get worse during the first weeks of this therapy. Tell your doctor or healthcare professional if your symptoms do not start to get better or if they get worse after this time. Your bones may get weaker if you take this medicine for a long time. If you smoke or frequently drink alcohol you may increase your risk of bone loss. A family history of osteoporosis, chronic use of drugs for seizures (convulsions), or corticosteroids can also increase your risk of bone loss. Talk to your doctor about how to keep your bones strong. This medicine should stop regular monthly menstration in women. Tell your doctor if you continue to menstrate. Women should not become pregnant while taking this medicine or for 12 weeks after stopping this medicine. Women should inform their doctor if they wish to become pregnant or think they might be pregnant. There is a potential for serious side effects to an unborn child. Talk to your health care professional or pharmacist for more information. Do not breast-feed an infant while taking   this medicine. Men should inform their doctors if they wish to father a child. This medicine may lower sperm counts. Talk to your health care professional or pharmacist for more information. What side effects may I notice from receiving this  medicine? Side effects that you should report to your doctor or health care professional as soon as possible: -allergic reactions like skin rash, itching or hives, swelling of the face, lips, or tongue -bone pain -breathing problems -changes in vision -chest pain -feeling faint or lightheaded, falls -fever, chills -pain, swelling, warmth in the leg -pain, tingling, numbness in the hands or feet -signs and symptoms of low blood pressure like dizziness; feeling faint or lightheaded, falls; unusually weak or tired -stomach pain -swelling of the ankles, feet, hands -trouble passing urine or change in the amount of urine -unusually high or low blood pressure -unusually weak or tired Side effects that usually do not require medical attention (report to your doctor or health care professional if they continue or are bothersome): -change in sex drive or performance -changes in breast size in both males and females -changes in emotions or moods -headache -hot flashes -irritation at site where injected -loss of appetite -skin problems like acne, dry skin -vaginal dryness This list may not describe all possible side effects. Call your doctor for medical advice about side effects. You may report side effects to FDA at 1-800-FDA-1088. Where should I keep my medicine? This drug is given in a hospital or clinic and will not be stored at home. NOTE: This sheet is a summary. It may not cover all possible information. If you have questions about this medicine, talk to your doctor, pharmacist, or health care provider.  2018 [employer]vier/Gold Standard (2013-06-16 11:10:35)  

## 2018-02-19 ENCOUNTER — Other Ambulatory Visit: Payer: Self-pay | Admitting: Hematology

## 2018-02-19 ENCOUNTER — Telehealth: Payer: Self-pay

## 2018-02-19 MED ORDER — CITALOPRAM HYDROBROMIDE 10 MG PO TABS: 10.0000 mg | ORAL_TABLET | Freq: Every day | ORAL | 2 refills | Status: DC

## 2018-02-19 NOTE — Telephone Encounter (Signed)
Left voice message for patient that Dr. Burr Medico sent in Celexa 10 mg take daily preferably near bedtime for hot flashes.

## 2018-02-19 NOTE — Telephone Encounter (Signed)
Patient calls requesting a medication for hot flashes, this was briefly discussed in her last visit.  Send into [facility]

## 2018-02-19 NOTE — Telephone Encounter (Signed)
She previously has tried Effexor and Neurontin but could not tolerate well. I called in celexa 10mg  daily for her.   Truitt Merle MD

## 2018-03-13 ENCOUNTER — Inpatient Hospital Stay: Payer: BLUE CROSS/BLUE SHIELD | Attending: Hematology

## 2018-03-13 DIAGNOSIS — Z5111 Encounter for antineoplastic chemotherapy: Secondary | ICD-10-CM | POA: Diagnosis not present

## 2018-03-13 DIAGNOSIS — C50511 Malignant neoplasm of lower-outer quadrant of right female breast: Secondary | ICD-10-CM | POA: Insufficient documentation

## 2018-03-13 DIAGNOSIS — D500 Iron deficiency anemia secondary to blood loss (chronic): Secondary | ICD-10-CM

## 2018-03-13 MED ORDER — GOSERELIN ACETATE 3.6 MG ~~LOC~~ IMPL
DRUG_IMPLANT | SUBCUTANEOUS | Status: AC
  Filled 2018-03-13: qty 3.6

## 2018-03-13 MED ORDER — GOSERELIN ACETATE 3.6 MG ~~LOC~~ IMPL
3.6000 mg | DRUG_IMPLANT | Freq: Once | SUBCUTANEOUS | Status: AC
  Administered 2018-03-13: 3.6 mg via SUBCUTANEOUS

## 2018-03-28 ENCOUNTER — Ambulatory Visit: Payer: BLUE CROSS/BLUE SHIELD | Admitting: Hematology

## 2018-03-28 ENCOUNTER — Other Ambulatory Visit: Payer: BLUE CROSS/BLUE SHIELD

## 2018-04-04 ENCOUNTER — Ambulatory Visit
Admission: RE | Admit: 2018-04-04 | Discharge: 2018-04-04 | Disposition: A | Discharge status: Home / Self Care | Payer: BLUE CROSS/BLUE SHIELD | Source: Ambulatory Visit | Attending: Family Medicine | Admitting: Family Medicine

## 2018-04-04 ENCOUNTER — Other Ambulatory Visit: Payer: Self-pay | Admitting: Family Medicine

## 2018-04-04 DIAGNOSIS — R05 Cough: Secondary | ICD-10-CM

## 2018-04-04 DIAGNOSIS — R059 Cough, unspecified: Secondary | ICD-10-CM

## 2018-04-14 NOTE — Progress Notes (Signed)
[place]   [phone] [phone]   Clinic Follow up Note   Patient Care Team: Lucianne Lei, MD as PCP - General (Family Medicine) Excell Seltzer, MD as Consulting Physician (General Surgery) Truitt Merle, MD as Consulting Physician (Hematology) Gardenia Phlegm, NP as Nurse Practitioner (Hematology and Oncology) [name], MD as Consulting Physician (Radiation Oncology) 04/17/2018  CHIEF COMPLAINT: F/u on right breast cancer   SUMMARY OF ONCOLOGIC HISTORY: Oncology History   Breast cancer of lower-outer quadrant of right female breast [facility])   Staging form: Breast, AJCC 7th Edition     Clinical stage from 08/03/2015: Stage IA (T1c, N0, M0) - Unsigned     Pathologic stage from 09/29/2015: Stage IIIA (T3(m), N1a, cM0) - Signed by Truitt Merle, MD on 10/17/2015         Breast cancer of lower-outer quadrant of right female breast ([place])   07/25/2015 Mammogram    diagnostic mammogram and ultrasound showed a 11 mm mass in the 8:00 position of the right breast, small irregular lesion in the retroareolar regio of the right breast, contiguous with the dermis    07/26/2015 Initial Diagnosis    Breast cancer of lower-outer quadrant of right female breast ([place])    07/26/2015 Initial Biopsy    right breast needle core biopsy at 8:00 position (1), invasive ductal carcinoma, grade 3, 6:30 o'clock position invasive (2) and in situ ductal carcinoma, grade 1-2, lymphovascular invasion is identified    07/26/2015 Receptors her2    (1) ER 95% positive, PR 95% positive, Ki-67 30%, HER-2 positive with ratio 2.04, copy # 5.20, (2) ER 90% positive, PR 95% positive, Ki-67 10%, HER-2 negative.    08/02/2015 Imaging    breast MRI showed extensive mass  and non-mass enhancement in the lower outer quadrant of the right breast  spanning 13 cm, focal enhancement within the right nipple, at least 3-4 abnormal appearing right axillary lymph node.    09/29/2015 Surgery   right mastectomy and axillar node dissection     09/29/2015 Pathology Results    Right mastectomy and axillary node dissection showed multifocal mammary carcinoma with ductal and lobular features, tumor size 13 cm, 2.1 cm, 1.9 cm, and 0.5 cm, grade 2-3, anterior margins were positive, 2 nodes with metastasis and one with isolated cells    11/01/2015 - 02/14/2016 Chemotherapy    TCHP every 3 weeks, for 6 cycles, followed by herceptin maintenance therapy for a total of one year     03/06/2016 - 11/01/2016 Antibody Plan    Maintenance Herceptin and perjeta, every 3 weeks    03/07/2016 - 04/27/2016 Radiation Therapy    03/07/16-04/27/16 Site/dose: 1) 23fd right chest wall/ 45 Gy in 25 fractions 2) Right mastectomy scar / 16 Gy in 8 fractions    05/21/2016 -  Anti-estrogen oral therapy    Tamoxifen 20 mg    10/17/2016 Mammogram    IMPRESSION: No mammographic evidence of malignancy. A result letter of this screening mammogram will be mailed directly to the patient.    10/22/2016 Echocardiogram    Impressions:  - Normal study. Stable GLPSS at -21%. LVEF 60-65%.    11/09/2016 - 11/09/2017 Anti-estrogen oral therapy     Neratinib 2437mdaily started on 11/09/2016. Completed in one year    02/04/2017 Survivorship    Survivorship Clinic with CaGardenia PhlegmNP    03/11/2017 Surgery    REMOVAL OF RIGHT TISSUE EXPANDERS WITH PLACEMRIGHT SILICONE  BREAST IMPLANTS and left MASTOPEXY FOR  SYMENTRY by Dr. Marla Roe  03/11/17    03/11/2017 Pathology Results    Diagnosis 03/11/17 1. Breast, capsule, right inferior BENIGN FIBROVASCULAR TISSUE 2. Breast, Mammoplasty, left BENIGN BREAST TISSUE NEGATIVE FOR ATYPIA OR MALIGNANCY     CURRENT THERAPY Letrozole started on 9/27//2019; Zoladex monthly injection started on December 16, 2017   INTERVAL HISTORY: Melanie Moss is a 42 y.o. female who is here for follow-up. Today, she is here alone. She is doing well and denies new complaints. She  still has hot flashes that are tolerable. She sometimes wakes up at night from the hot flashes, but says that this is tolerable. She is starting a new job soon and will not be able to take time off for treatment. She will have some Fridays off, but she is not sure when.    Pertinent positives and negatives of review of systems are listed and detailed within the above HPI.  REVIEW OF SYSTEMS:   Constitutional: Denies fevers, chills or abnormal weight loss (+) hot flashes  Eyes: Denies blurriness of vision Ears, nose, mouth, throat, and face: Denies mucositis or sore throat Respiratory: Denies cough, dyspnea or wheezes Cardiovascular: Denies palpitation, chest discomfort or lower extremity swelling Gastrointestinal:  Denies nausea, heartburn or change in bowel habits Skin: Denies abnormal skin rashes Lymphatics: Denies new lymphadenopathy or easy bruising Neurological:Denies numbness, tingling or new weaknesses Behavioral/Psych: Mood is stable, no new changes  All other systems were reviewed with the patient and are negative.  MEDICAL HISTORY:  Past Medical History:  Diagnosis Date  . Anemia   . Anxiety   . Breast cancer of lower-outer quadrant of right female breast ([place]) 07/29/2015  . Family history of breast cancer   . Family history of colon cancer   . Family history of prostate cancer   . GERD (gastroesophageal reflux disease)    tums   . History of radiation therapy 03/07/16-04/27/16   right chest wall 45 Gy in 25 fractions, right mastectomy scar 16 Gy in 8 fractions  . Hyperlipidemia   . Hypertension   . Lymphedema   . Personal history of chemotherapy   . Personal history of radiation therapy   . Preterm labor     SURGICAL HISTORY: Past Surgical History:  Procedure Laterality Date  . AXILLARY LYMPH NODE DISSECTION Right 09/29/2015   Procedure: AXILLARY LYMPH NODE DISSECTION;  Surgeon: Excell Seltzer, MD;  Location: [facility];  Service: General;  Laterality: Right;  . BREAST  IMPLANT REMOVAL Right 06/01/2017   Procedure: EXPLANT OF RIGHT 480 MENTOR BREAST IMPLANT;  Surgeon: Wallace Going, DO;  Location: [place];  Service: Plastics;  Laterality: Right;  . BREAST RECONSTRUCTION Right 08/08/2017   Procedure: RIGHT BREAST RECONSTRUCTION WITH LATISSMA MYOCUTANEOUS FLAP AND EXPANDER PLACEMENT;  Surgeon: Wallace Going, DO;  Location: [place];  Service: Plastics;  Laterality: Right;  . BREAST RECONSTRUCTION WITH PLACEMENT OF TISSUE EXPANDER AND FLEX HD (ACELLULAR HYDRATED DERMIS) Right 09/29/2015   Procedure: BREAST RECONSTRUCTION WITH PLACEMENT OF TISSUE EXPANDER AND FLEX HD (ACELLULAR HYDRATED DERMIS);  Surgeon: Loel Lofty Dillingham, DO;  Location: [place];  Service: Plastics;  Laterality: Right;  . CERVICAL CERCLAGE  02/19/2011   Procedure: CERCLAGE CERVICAL;  Surgeon: Agnes Lawrence, MD;  Location: [facility];  Service: Gynecology;  Laterality: N/A;  . DILATION AND CURETTAGE OF UTERUS    . INCISION AND DRAINAGE OF WOUND Right 04/19/2017   Procedure: IRRIGATION OF RIGHT BREAST POCKET;  Surgeon: Wallace Going, DO;  Location: [facility];  Service: Plastics;  Laterality: Right;  . LATISSIMUS FLAP TO BREAST Right 08/08/2017   Procedure: LATISSIMUS FLAP TO BREAST;  Surgeon: Wallace Going, DO;  Location: [place];  Service: Plastics;  Laterality: Right;  . MASTECTOMY Right 2017   right 2017  . MASTECTOMY WITH AXILLARY LYMPH NODE DISSECTION Right 09/29/2015  . MASTOPEXY Left 03/11/2017   Procedure: MASTOPEXY FOR SYMENTRY;  Surgeon: Wallace Going, DO;  Location: [place];  Service: Plastics;  Laterality: Left;  . PORT-A-CATH REMOVAL  10/2016  . PORTACATH PLACEMENT N/A 09/29/2015   Procedure: INSERTION PORT-A-CATH;  Surgeon: Excell Seltzer, MD;  Location: [place];  Service: General;  Laterality: N/A;  . REMOVAL OF BILATERAL TISSUE EXPANDERS WITH PLACEMENT OF BILATERAL BREAST IMPLANTS Right 03/11/2017   Procedure: REMOVAL OF  RIGHT TISSUE EXPANDERS WITH PLACEMRIGHT SILICONE  BREAST IMPLANTS;  Surgeon: Wallace Going, DO;  Location: [place];  Service: Plastics;  Laterality: Right;  . REMOVAL OF TISSUE EXPANDER AND PLACEMENT OF IMPLANT Right 10/19/2017   Procedure: REMOVAL OF TISSUE EXPANDER;  Surgeon: Wallace Going, DO;  Location: [place];  Service: Plastics;  Laterality: Right;  . SIMPLE MASTECTOMY WITH AXILLARY SENTINEL NODE BIOPSY Right 09/29/2015   Procedure: RIGHT TOTAL  MASTECTOMY WITH AXILLARY SENTINEL NODE BIOPSY;  Surgeon: Excell Seltzer, MD;  Location: [place];  Service: General;  Laterality: Right;    I have reviewed the social history and family history with the patient and they are unchanged from previous note.  ALLERGIES:  has No Known Allergies.  MEDICATIONS:  Current Outpatient Medications  Medication Sig Dispense Refill  . B Complex-C (SUPER B COMPLEX PO) Take 1 tablet by mouth daily.    . citalopram (CELEXA) 10 MG tablet Take 1 tablet (10 mg total) by mouth daily. 30 tablet 2  . irbesartan-hydrochlorothiazide (AVALIDE) 150-12.5 MG tablet Take 1 tablet by mouth daily.    . Iron-FA-B Cmp-C-Biot-Probiotic (FUSION PLUS PO) Take 1 tablet by mouth daily.    Marland Kitchen letrozole (FEMARA) 2.5 MG tablet Take 1 tablet (2.5 mg total) by mouth daily. 30 tablet 3   No current facility-administered medications for this visit.    Facility-Administered Medications Ordered in Other Visits  Medication Dose Route Frequency Provider Last Rate Last Dose  . 0.9 %  sodium chloride infusion   Intravenous Once Truitt Merle, MD      . alteplase (CATHFLO ACTIVASE) injection 2 mg  2 mg Intracatheter Once PRN Truitt Merle, MD      . heparin lock flush 100 unit/mL  250 Units Intracatheter Once PRN Truitt Merle, MD      . heparin lock flush 100 unit/mL  500 Units Intravenous Once PRN Truitt Merle, MD      . sodium chloride 0.9 % injection 10 mL  10 mL Intracatheter PRN Truitt Merle, MD      . sodium chloride 0.9 %  injection 10 mL  10 mL Intravenous PRN Truitt Merle, MD      . sodium chloride 0.9 % injection 3 mL  3 mL Intravenous Once PRN Truitt Merle, MD      . sodium chloride flush (NS) 0.9 % injection 10 mL  10 mL Intravenous PRN Truitt Merle, MD        PHYSICAL EXAMINATION: ECOG PERFORMANCE STATUS: 0 - Asymptomatic  Vitals:   04/17/18 1308  BP: 126/85  Pulse: 86  Resp: 17  Temp: (!) 97.1 F (36.2 C)  SpO2: 100%   Filed  Weights   04/17/18 1308  Weight: 138 lb 4.8 oz (62.7 kg)    GENERAL:alert, no distress and comfortable SKIN: skin color, texture, turgor are normal, no rashes or significant lesions EYES: normal, Conjunctiva are pink and non-injected, sclera clear OROPHARYNX:no exudate, no erythema and lips, buccal mucosa, and tongue normal  NECK: supple, thyroid normal size, non-tender, without nodularity LYMPH:  no palpable lymphadenopathy in the cervical, axillary or inguinal LUNGS: clear to auscultation and percussion with normal breathing effort HEART: regular rate & rhythm and no murmurs and no lower extremity edema ABDOMEN:abdomen soft, non-tender and normal bowel sounds. No organomegaly Musculoskeletal:no cyanosis of digits and no clubbing  NEURO: alert & oriented x 3 with fluent speech, no focal motor/sensory deficits Breasts: No new palpable masses, skin or nipple changes. (+) mild numbness at site if incision (+) lumpy scar tissue at incision site  (+) s/p right mastectomy   LABORATORY DATA:  I have reviewed the data as listed CBC Latest Ref Rng & Units 04/17/2018 01/16/2018 12/16/2017  WBC 4.0 - 10.5 K/uL 5.6 5.5 7.3  Hemoglobin 12.0 - 15.0 g/dL 11.7(L) 12.4 12.2  Hematocrit 36.0 - 46.0 % 36.1 36.6 36.8  Platelets 150 - 400 K/uL 212 266 256     CMP Latest Ref Rng & Units 04/17/2018 01/16/2018 12/16/2017  Glucose 70 - 99 mg/dL 113(H) 101(H) 99  BUN 6 - 20 mg/dL _0 Creatinine 0.44 - 1.00 mg/dL 0.98 0.98 0.92  Sodium 135 - 145 mmol/L 143 140 140  Potassium 3.5 - 5.1  mmol/L 4.0 3.3(L) 3.8  Chloride 98 - 111 mmol/L 104 101 103  CO2 22 - 32 mmol/L 32 31 29  Calcium 8.9 - 10.3 mg/dL 10.0 10.2 10.3  Total Protein 6.5 - 8.1 g/dL 7.4 7.9 7.7  Total Bilirubin 0.3 - 1.2 mg/dL 0.4 0.5 0.3  Alkaline Phos 38 - 126 U/L 54 46 46  AST 15 - 41 U/L _1 ALT 0 - 44 U/L _2 RADIOGRAPHIC STUDIES: I have personally reviewed the radiological images as listed and agreed with the findings in the report. No results found.   ASSESSMENT & PLAN:  Melanie Moss is a 42 y.o. female with history of  1. 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 Treated with right mastectomy, radiation, adjuvant chemo and HER2 antibodies. Tamoxifen was switched to Letrozole on 01/17/2018. She also currently receives Zoladex injections. She is tolerating well  -Labs reviewed, CBC showed Hg 11.7 CMP is WNLs. Iron studies pending.  -She is tolerating Letrozole well with manageable hot flashes.  -She agree with BSO but doesn't plan to have any surgeries any time soon. She doesn't want more children. She will let me know when she is ready -Continue monthly Zoladex injection continue BSO -f/u in 4 months -Left breast mammogram in 09/2018  2. Iron deficient anemia, and anemia of chronic disease  -Labs reviewed, CBC showed Hg 11.7. Iron studies pending.  -Currently on oral iron. Continue.   3.  HTN -f/u with PCP  4. Right arm lymphedema -Currently uses compression cast on her right arm, and a pump from PT. -Continue and f/u with PT  5. Hot flashes and mood swings. -I previously prescribed Celexa 10 mg.  Continue.  Plan  -f/u in 4 months -continue Letrozole. I refilled  today  -Labs reviewed, good to proceed with Zoladex injection today and continue monthly.  -Left breast mammogram in  09/2018  No problem-specific Assessment & Plan notes found for this encounter.   No orders of the defined types were placed in this encounter.  All questions were answered. The patient knows to call the clinic with any problems, questions or concerns. No barriers to learning was detected. I spent 20 minutes counseling the patient face to face. The total time spent in the appointment was 25 minutes and more than 50% was on counseling and review of test results  I, Noor Dweik am acting as scribe for Dr. Truitt Merle.  I have reviewed the above documentation for accuracy and completeness, and I agree with the above.     Truitt Merle, MD 04/17/2018

## 2018-04-17 ENCOUNTER — Inpatient Hospital Stay (HOSPITAL_BASED_OUTPATIENT_CLINIC_OR_DEPARTMENT_OTHER): Payer: BLUE CROSS/BLUE SHIELD | Admitting: Hematology

## 2018-04-17 ENCOUNTER — Inpatient Hospital Stay: Payer: BLUE CROSS/BLUE SHIELD | Attending: Hematology

## 2018-04-17 ENCOUNTER — Telehealth: Payer: Self-pay | Admitting: Hematology

## 2018-04-17 ENCOUNTER — Inpatient Hospital Stay: Payer: BLUE CROSS/BLUE SHIELD

## 2018-04-17 VITALS — BP 126/85 | HR 86 | Temp 97.1°F | Resp 17 | Ht 63.0 in | Wt 138.3 lb

## 2018-04-17 DIAGNOSIS — F39 Unspecified mood [affective] disorder: Secondary | ICD-10-CM

## 2018-04-17 DIAGNOSIS — D500 Iron deficiency anemia secondary to blood loss (chronic): Secondary | ICD-10-CM

## 2018-04-17 DIAGNOSIS — C50511 Malignant neoplasm of lower-outer quadrant of right female breast: Secondary | ICD-10-CM | POA: Insufficient documentation

## 2018-04-17 DIAGNOSIS — Z923 Personal history of irradiation: Secondary | ICD-10-CM | POA: Insufficient documentation

## 2018-04-17 DIAGNOSIS — D509 Iron deficiency anemia, unspecified: Secondary | ICD-10-CM

## 2018-04-17 DIAGNOSIS — Z5111 Encounter for antineoplastic chemotherapy: Secondary | ICD-10-CM | POA: Insufficient documentation

## 2018-04-17 DIAGNOSIS — Z79811 Long term (current) use of aromatase inhibitors: Secondary | ICD-10-CM | POA: Diagnosis not present

## 2018-04-17 DIAGNOSIS — N951 Menopausal and female climacteric states: Secondary | ICD-10-CM | POA: Insufficient documentation

## 2018-04-17 DIAGNOSIS — Z170 Estrogen receptor positive status [ER+]: Secondary | ICD-10-CM | POA: Diagnosis not present

## 2018-04-17 DIAGNOSIS — I890 Lymphedema, not elsewhere classified: Secondary | ICD-10-CM | POA: Diagnosis not present

## 2018-04-17 DIAGNOSIS — Z9221 Personal history of antineoplastic chemotherapy: Secondary | ICD-10-CM | POA: Diagnosis not present

## 2018-04-17 DIAGNOSIS — I10 Essential (primary) hypertension: Secondary | ICD-10-CM | POA: Diagnosis not present

## 2018-04-17 DIAGNOSIS — Z9011 Acquired absence of right breast and nipple: Secondary | ICD-10-CM | POA: Diagnosis not present

## 2018-04-17 LAB — COMPREHENSIVE METABOLIC PANEL
ALT: 16 U/L (ref 0–44)
AST: 23 U/L (ref 15–41)
Albumin: 4.1 g/dL (ref 3.5–5.0)
Alkaline Phosphatase: 54 U/L (ref 38–126)
Anion gap: 7 (ref 5–15)
BUN: 13 mg/dL (ref 6–20)
CO2: 32 mmol/L (ref 22–32)
Calcium: 10 mg/dL (ref 8.9–10.3)
Chloride: 104 mmol/L (ref 98–111)
Creatinine, Ser: 0.98 mg/dL (ref 0.44–1.00)
GFR calc Af Amer: >60 mL/min (ref >60)
GFR calc non Af Amer: >60 mL/min (ref >60)
Glucose, Bld: 113 mg/dL — ABNORMAL HIGH (ref 70–99)
Potassium: 4 mmol/L (ref 3.5–5.1)
Sodium: 143 mmol/L (ref 135–145)
Total Bilirubin: 0.4 mg/dL (ref 0.3–1.2)
Total Protein: 7.4 g/dL (ref 6.5–8.1)

## 2018-04-17 LAB — CBC WITH DIFFERENTIAL/PLATELET
Abs Immature Granulocytes: 0.01 10*3/uL (ref 0.00–0.07)
Basophils Absolute: 0 10*3/uL (ref 0.0–0.1)
Basophils Relative: 1 %
Eosinophils Absolute: 0.2 10*3/uL (ref 0.0–0.5)
Eosinophils Relative: 4 %
HCT: 36.1 % (ref 36.0–46.0)
Hemoglobin: 11.7 g/dL — ABNORMAL LOW (ref 12.0–15.0)
Immature Granulocytes: 0 %
Lymphocytes Relative: 46 %
Lymphs Abs: 2.6 10*3/uL (ref 0.7–4.0)
MCH: 30.8 pg (ref 26.0–34.0)
MCHC: 32.4 g/dL (ref 30.0–36.0)
MCV: 95 fL (ref 80.0–100.0)
Monocytes Absolute: 0.3 10*3/uL (ref 0.1–1.0)
Monocytes Relative: 5 %
Neutro Abs: 2.5 10*3/uL (ref 1.7–7.7)
Neutrophils Relative %: 44 %
Platelets: 212 10*3/uL (ref 150–400)
RBC: 3.8 MIL/uL — ABNORMAL LOW (ref 3.87–5.11)
RDW: 11.9 % (ref 11.5–15.5)
WBC: 5.6 10*3/uL (ref 4.0–10.5)
nRBC: 0 % (ref 0.0–0.2)

## 2018-04-17 LAB — IRON AND TIBC
Iron: 75 ug/dL (ref 41–142)
Saturation Ratios: 29 % (ref 21–57)
TIBC: 255 ug/dL (ref 236–444)
UIBC: 180 ug/dL (ref 120–384)

## 2018-04-17 LAB — FERRITIN: Ferritin: 131 ng/mL (ref 11–307)

## 2018-04-17 MED ORDER — GOSERELIN ACETATE 3.6 MG ~~LOC~~ IMPL
3.6000 mg | DRUG_IMPLANT | Freq: Once | SUBCUTANEOUS | Status: AC
  Administered 2018-04-17: 3.6 mg via SUBCUTANEOUS

## 2018-04-17 MED ORDER — LETROZOLE 2.5 MG PO TABS: 2.5000 mg | ORAL_TABLET | Freq: Every day | ORAL | 3 refills | Status: DC

## 2018-04-17 MED ORDER — GOSERELIN ACETATE 3.6 MG ~~LOC~~ IMPL
DRUG_IMPLANT | SUBCUTANEOUS | Status: AC
  Filled 2018-04-17: qty 3.6

## 2018-04-17 NOTE — Patient Instructions (Signed)
Goserelin injection What is this medicine? GOSERELIN (GOE se rel in) is similar to a hormone found in the body. It lowers the amount of sex hormones that the body makes. Men will have lower testosterone levels and women will have lower estrogen levels while taking this medicine. In men, this medicine is used to treat prostate cancer; the injection is either given once per month or once every 12 weeks. A once per month injection (only) is used to treat women with endometriosis, dysfunctional uterine bleeding, or advanced breast cancer. This medicine may be used for other purposes; ask your health care provider or pharmacist if you have questions. COMMON BRAND NAME(S): Zoladex What should I tell my health care provider before I take this medicine? They need to know if you have any of these conditions (some only apply to women): -diabetes -heart disease or previous heart attack -high blood pressure -high cholesterol -kidney disease -osteoporosis or low bone density -problems passing urine -spinal cord injury -stroke -tobacco smoker -an unusual or allergic reaction to goserelin, hormone therapy, other medicines, foods, dyes, or preservatives -pregnant or trying to get pregnant -breast-feeding How should I use this medicine? This medicine is for injection under the skin. It is given by a health care professional in a hospital or clinic setting. Men receive this injection once every 4 weeks or once every 12 weeks. Women will only receive the once every 4 weeks injection. Talk to your pediatrician regarding the use of this medicine in children. Special care may be needed. Overdosage: If you think you have taken too much of this medicine contact a poison control center or emergency room at once. NOTE: This medicine is only for you. Do not share this medicine with others. What if I miss a dose? It is important not to miss your dose. Call your doctor or health care professional if you are unable to  keep an appointment. What may interact with this medicine? -female hormones like estrogen -herbal or dietary supplements like black cohosh, chasteberry, or DHEA -female hormones like testosterone -prasterone This list may not describe all possible interactions. Give your health care provider a list of all the medicines, herbs, non-prescription drugs, or dietary supplements you use. Also tell them if you smoke, drink alcohol, or use illegal drugs. Some items may interact with your medicine. What should I watch for while using this medicine? Visit your doctor or health care professional for regular checks on your progress. Your symptoms may appear to get worse during the first weeks of this therapy. Tell your doctor or healthcare professional if your symptoms do not start to get better or if they get worse after this time. Your bones may get weaker if you take this medicine for a long time. If you smoke or frequently drink alcohol you may increase your risk of bone loss. A family history of osteoporosis, chronic use of drugs for seizures (convulsions), or corticosteroids can also increase your risk of bone loss. Talk to your doctor about how to keep your bones strong. This medicine should stop regular monthly menstration in women. Tell your doctor if you continue to menstrate. Women should not become pregnant while taking this medicine or for 12 weeks after stopping this medicine. Women should inform their doctor if they wish to become pregnant or think they might be pregnant. There is a potential for serious side effects to an unborn child. Talk to your health care professional or pharmacist for more information. Do not breast-feed an infant while taking   this medicine. Men should inform their doctors if they wish to father a child. This medicine may lower sperm counts. Talk to your health care professional or pharmacist for more information. What side effects may I notice from receiving this  medicine? Side effects that you should report to your doctor or health care professional as soon as possible: -allergic reactions like skin rash, itching or hives, swelling of the face, lips, or tongue -bone pain -breathing problems -changes in vision -chest pain -feeling faint or lightheaded, falls -fever, chills -pain, swelling, warmth in the leg -pain, tingling, numbness in the hands or feet -signs and symptoms of low blood pressure like dizziness; feeling faint or lightheaded, falls; unusually weak or tired -stomach pain -swelling of the ankles, feet, hands -trouble passing urine or change in the amount of urine -unusually high or low blood pressure -unusually weak or tired Side effects that usually do not require medical attention (report to your doctor or health care professional if they continue or are bothersome): -change in sex drive or performance -changes in breast size in both males and females -changes in emotions or moods -headache -hot flashes -irritation at site where injected -loss of appetite -skin problems like acne, dry skin -vaginal dryness This list may not describe all possible side effects. Call your doctor for medical advice about side effects. You may report side effects to FDA at 1-800-FDA-1088. Where should I keep my medicine? This drug is given in a hospital or clinic and will not be stored at home. NOTE: This sheet is a summary. It may not cover all possible information. If you have questions about this medicine, talk to your doctor, pharmacist, or health care provider.  2018 [employer]vier/Gold Standard (2013-06-16 11:10:35)  

## 2018-04-17 NOTE — Telephone Encounter (Signed)
Printed calendar and avs. °

## 2018-04-18 ENCOUNTER — Encounter: Payer: Self-pay | Admitting: Hematology

## 2018-04-30 ENCOUNTER — Telehealth: Payer: Self-pay

## 2018-04-30 NOTE — Telephone Encounter (Signed)
Spoke with patient informed sent scheduling a message to move her injection to 1/14, instructed her to meet with Rob in Pharmacy when she comes to see if there is patient assistance available to help cover her medication.

## 2018-04-30 NOTE — Telephone Encounter (Signed)
Patient calls stating she is scheduled to get Zoladex on 1/25 however she will be without insurance after 1/15 and wants to know if she can get it around 1/14.  Explained I will check with Dr. Burr Medico and call her back at [phone]

## 2018-05-01 ENCOUNTER — Telehealth: Payer: Self-pay | Admitting: Hematology

## 2018-05-01 NOTE — Telephone Encounter (Signed)
Called patient per 1/8 sch message - pt is aware of appt date and time

## 2018-05-06 ENCOUNTER — Inpatient Hospital Stay: Payer: BLUE CROSS/BLUE SHIELD | Attending: Hematology

## 2018-05-06 VITALS — BP 144/87 | HR 70 | Temp 97.9°F | Resp 18

## 2018-05-06 DIAGNOSIS — D500 Iron deficiency anemia secondary to blood loss (chronic): Secondary | ICD-10-CM

## 2018-05-06 DIAGNOSIS — C50511 Malignant neoplasm of lower-outer quadrant of right female breast: Secondary | ICD-10-CM | POA: Insufficient documentation

## 2018-05-06 DIAGNOSIS — Z5111 Encounter for antineoplastic chemotherapy: Secondary | ICD-10-CM | POA: Insufficient documentation

## 2018-05-06 MED ORDER — GOSERELIN ACETATE 3.6 MG ~~LOC~~ IMPL
DRUG_IMPLANT | SUBCUTANEOUS | Status: AC
  Filled 2018-05-06: qty 3.6

## 2018-05-06 MED ORDER — GOSERELIN ACETATE 3.6 MG ~~LOC~~ IMPL
3.6000 mg | DRUG_IMPLANT | Freq: Once | SUBCUTANEOUS | Status: AC
  Administered 2018-05-06: 3.6 mg via SUBCUTANEOUS

## 2018-05-06 NOTE — Progress Notes (Signed)
Pt can receive injection early per Dr. Burr Medico due to insurance. Porsche Cates LPN

## 2018-05-17 ENCOUNTER — Ambulatory Visit: Payer: BLUE CROSS/BLUE SHIELD

## 2018-05-28 NOTE — Progress Notes (Signed)
[place]   Telephone:(336) [phone] Fax:(336) [phone]   Clinic Follow up Note   Patient Care Team: Lucianne Lei, MD as PCP - General (Family Medicine) Excell Seltzer, MD as Consulting Physician (General Surgery) Truitt Merle, MD as Consulting Physician (Hematology) Gardenia Phlegm, NP as Nurse Practitioner (Hematology and Oncology) [name], MD as Consulting Physician (Radiation Oncology)  Date of Service:  05/30/2018  CHIEF COMPLAINT: F/u on right breast cancer  SUMMARY OF ONCOLOGIC HISTORY: Oncology History   Breast cancer of lower-outer quadrant of right female breast [facility])   Staging form: Breast, AJCC 7th Edition     Clinical stage from 08/03/2015: Stage IA (T1c, N0, M0) - Unsigned     Pathologic stage from 09/29/2015: Stage IIIA (T3(m), N1a, cM0) - Signed by Truitt Merle, MD on 10/17/2015         Breast cancer of lower-outer quadrant of right female breast ([place])   07/25/2015 Mammogram    diagnostic mammogram and ultrasound showed a 11 mm mass in the 8:00 position of the right breast, small irregular lesion in the retroareolar regio of the right breast, contiguous with the dermis    07/26/2015 Initial Diagnosis    Breast cancer of lower-outer quadrant of right female breast ([place])    07/26/2015 Initial Biopsy    right breast needle core biopsy at 8:00 position (1), invasive ductal carcinoma, grade 3, 6:30 o'clock position invasive (2) and in situ ductal carcinoma, grade 1-2, lymphovascular invasion is identified    07/26/2015 Receptors her2    (1) ER 95% positive, PR 95% positive, Ki-67 30%, HER-2 positive with ratio 2.04, copy # 5.20, (2) ER 90% positive, PR 95% positive, Ki-67 10%, HER-2 negative.    08/02/2015 Imaging    breast MRI showed extensive mass  and non-mass enhancement in the lower outer quadrant of the right breast  spanning 13 cm, focal enhancement within the right nipple, at least 3-4 abnormal appearing right axillary lymph node.    09/29/2015 Surgery    right mastectomy and axillar node dissection     09/29/2015 Pathology Results    Right mastectomy and axillary node dissection showed multifocal mammary carcinoma with ductal and lobular features, tumor size 13 cm, 2.1 cm, 1.9 cm, and 0.5 cm, grade 2-3, anterior margins were positive, 2 nodes with metastasis and one with isolated cells    11/01/2015 - 02/14/2016 Chemotherapy    TCHP every 3 weeks, for 6 cycles, followed by herceptin maintenance therapy for a total of one year     03/06/2016 - 11/01/2016 Antibody Plan    Maintenance Herceptin and perjeta, every 3 weeks    03/07/2016 - 04/27/2016 Radiation Therapy    03/07/16-04/27/16 Site/dose: 1) 59fd right chest wall/ 45 Gy in 25 fractions 2) Right mastectomy scar / 16 Gy in 8 fractions    05/21/2016 -  Anti-estrogen oral therapy    Tamoxifen 20 mg daily starting 05/21/16 Switched to Letrozole 2.'5mg'$  daily on 97/65/46  Started monthyl Zolodex injections on 12/16/17    10/17/2016 Mammogram    IMPRESSION: No mammographic evidence of malignancy. A result letter of this screening mammogram will be mailed directly to the patient.    10/22/2016 Echocardiogram    Impressions:  - Normal study. Stable GLPSS at -21%. LVEF 60-65%.    11/09/2016 - 11/09/2017 Anti-estrogen oral therapy     Neratinib '240mg'$  daily started on 11/09/2016. Completed in one year    02/04/2017 Survivorship    Survivorship Clinic with CDelice Bison LCharlestine Massed NP  03/11/2017 Surgery    REMOVAL OF RIGHT TISSUE EXPANDERS WITH PLACEMRIGHT SILICONE  BREAST IMPLANTS and left MASTOPEXY FOR SYMENTRY by Dr. Marla Roe  03/11/17    03/11/2017 Pathology Results    Diagnosis 03/11/17 1. Breast, capsule, right inferior BENIGN FIBROVASCULAR TISSUE 2. Breast, Mammoplasty, left BENIGN BREAST TISSUE NEGATIVE FOR ATYPIA OR MALIGNANCY      CURRENT THERAPY:  Letrozole 2.'5mg'$  daily started on 9/27//2019 Zoladexmonthly injection started on December 16, 2017  INTERVAL HISTORY:  Melanie Moss is here for a follow up of right breast cancer. She presents to the clinic today by herself. She notes she is doing well. She is tolerating Letrozole and Zoladex well with bearable hot flashes and joint stiffness. She notes she plans to do breast reconstruction later this year due to recurrent infections. She plans to have BSO in 2 months. She notes muscles are from surgery site occasionally, but this is manageable.  She notes her stress is down with better schedule at work in insurance and has more time to take care of herself.     REVIEW OF SYSTEMS:   Constitutional: Denies fevers, chills or abnormal weight loss (+) Hot flashes, bearable  Eyes: Denies blurriness of vision Ears, nose, mouth, throat, and face: Denies mucositis or sore throat Respiratory: Denies cough, dyspnea or wheezes Cardiovascular: Denies palpitation, chest discomfort or lower extremity swelling Gastrointestinal:  Denies nausea, heartburn or change in bowel habits Skin: Denies abnormal skin rashes MSK: (+) Joint stiffness (+) Muscle aches Lymphatics: Denies new lymphadenopathy or easy bruising Neurological:Denies numbness, tingling or new weaknesses Behavioral/Psych: Mood is stable, no new changes  All other systems were reviewed with the patient and are negative.  MEDICAL HISTORY:  Past Medical History:  Diagnosis Date  . Anemia   . Anxiety   . Breast cancer of lower-outer quadrant of right female breast ([place]) 07/29/2015  . Family history of breast cancer   . Family history of colon cancer   . Family history of prostate cancer   . GERD (gastroesophageal reflux disease)    tums   . History of radiation therapy 03/07/16-04/27/16   right chest wall 45 Gy in 25 fractions, right mastectomy scar 16 Gy in 8 fractions  . Hyperlipidemia   . Hypertension   . Lymphedema   . Personal history of chemotherapy   . Personal history of radiation therapy   . Preterm labor     SURGICAL  HISTORY: Past Surgical History:  Procedure Laterality Date  . AXILLARY LYMPH NODE DISSECTION Right 09/29/2015   Procedure: AXILLARY LYMPH NODE DISSECTION;  Surgeon: Excell Seltzer, MD;  Location: [place];  Service: General;  Laterality: Right;  . BREAST IMPLANT REMOVAL Right 06/01/2017   Procedure: EXPLANT OF RIGHT 480 MENTOR BREAST IMPLANT;  Surgeon: Wallace Going, DO;  Location: [place];  Service: Plastics;  Laterality: Right;  . BREAST RECONSTRUCTION Right 08/08/2017   Procedure: RIGHT BREAST RECONSTRUCTION WITH LATISSMA MYOCUTANEOUS FLAP AND EXPANDER PLACEMENT;  Surgeon: Wallace Going, DO;  Location: [place];  Service: Plastics;  Laterality: Right;  . BREAST RECONSTRUCTION WITH PLACEMENT OF TISSUE EXPANDER AND FLEX HD (ACELLULAR HYDRATED DERMIS) Right 09/29/2015   Procedure: BREAST RECONSTRUCTION WITH PLACEMENT OF TISSUE EXPANDER AND FLEX HD (ACELLULAR HYDRATED DERMIS);  Surgeon: Loel Lofty Dillingham, DO;  Location: [place];  Service: Plastics;  Laterality: Right;  . CERVICAL CERCLAGE  02/19/2011   Procedure: CERCLAGE CERVICAL;  Surgeon: Agnes Lawrence, MD;  Location: [facility];  Service: Gynecology;  Laterality: N/A;  .  DILATION AND CURETTAGE OF UTERUS    . INCISION AND DRAINAGE OF WOUND Right 04/19/2017   Procedure: IRRIGATION OF RIGHT BREAST POCKET;  Surgeon: Wallace Going, DO;  Location: [place];  Service: Plastics;  Laterality: Right;  . LATISSIMUS FLAP TO BREAST Right 08/08/2017   Procedure: LATISSIMUS FLAP TO BREAST;  Surgeon: Wallace Going, DO;  Location: [place];  Service: Plastics;  Laterality: Right;  . MASTECTOMY Right 2017   right 2017  . MASTECTOMY WITH AXILLARY LYMPH NODE DISSECTION Right 09/29/2015  . MASTOPEXY Left 03/11/2017   Procedure: MASTOPEXY FOR SYMENTRY;  Surgeon: Wallace Going, DO;  Location: [place];  Service: Plastics;  Laterality: Left;  . PORT-A-CATH REMOVAL  10/2016  . PORTACATH PLACEMENT N/A 09/29/2015    Procedure: INSERTION PORT-A-CATH;  Surgeon: Excell Seltzer, MD;  Location: [place];  Service: General;  Laterality: N/A;  . REMOVAL OF BILATERAL TISSUE EXPANDERS WITH PLACEMENT OF BILATERAL BREAST IMPLANTS Right 03/11/2017   Procedure: REMOVAL OF RIGHT TISSUE EXPANDERS WITH PLACEMRIGHT SILICONE  BREAST IMPLANTS;  Surgeon: Wallace Going, DO;  Location: [place];  Service: Plastics;  Laterality: Right;  . REMOVAL OF TISSUE EXPANDER AND PLACEMENT OF IMPLANT Right 10/19/2017   Procedure: REMOVAL OF TISSUE EXPANDER;  Surgeon: Wallace Going, DO;  Location: [place];  Service: Plastics;  Laterality: Right;  . SIMPLE MASTECTOMY WITH AXILLARY SENTINEL NODE BIOPSY Right 09/29/2015   Procedure: RIGHT TOTAL  MASTECTOMY WITH AXILLARY SENTINEL NODE BIOPSY;  Surgeon: Excell Seltzer, MD;  Location: [place];  Service: General;  Laterality: Right;    I have reviewed the social history and family history with the patient and they are unchanged from previous note.  ALLERGIES:  has No Known Allergies.  MEDICATIONS:  Current Outpatient Medications  Medication Sig Dispense Refill  . B Complex-C (SUPER B COMPLEX PO) Take 1 tablet by mouth daily.    . citalopram (CELEXA) 10 MG tablet Take 1 tablet (10 mg total) by mouth daily. 30 tablet 2  . irbesartan-hydrochlorothiazide (AVALIDE) 150-12.5 MG tablet Take 1 tablet by mouth daily.    . Iron-FA-B Cmp-C-Biot-Probiotic (FUSION PLUS PO) Take 1 tablet by mouth daily.    Marland Kitchen letrozole (FEMARA) 2.5 MG tablet Take 1 tablet (2.5 mg total) by mouth daily. 90 tablet 3   No current facility-administered medications for this visit.    Facility-Administered Medications Ordered in Other Visits  Medication Dose Route Frequency Provider Last Rate Last Dose  . 0.9 %  sodium chloride infusion   Intravenous Once Truitt Merle, MD      . alteplase (CATHFLO ACTIVASE) injection 2 mg  2 mg Intracatheter Once PRN Truitt Merle, MD      . goserelin (ZOLADEX) injection  3.6 mg  3.6 mg Subcutaneous Once Truitt Merle, MD      . heparin lock flush 100 unit/mL  250 Units Intracatheter Once PRN Truitt Merle, MD      . heparin lock flush 100 unit/mL  500 Units Intravenous Once PRN Truitt Merle, MD      . sodium chloride 0.9 % injection 10 mL  10 mL Intracatheter PRN Truitt Merle, MD      . sodium chloride 0.9 % injection 10 mL  10 mL Intravenous PRN Truitt Merle, MD      . sodium chloride 0.9 % injection 3 mL  3 mL Intravenous Once PRN Truitt Merle, MD      . sodium chloride flush (NS) 0.9 % injection 10  mL  10 mL Intravenous PRN Truitt Merle, MD        PHYSICAL EXAMINATION: ECOG PERFORMANCE STATUS: 0 - Asymptomatic  Vitals:   05/30/18 0839  BP: 127/83  Pulse: 88  Resp: 18  Temp: 98.1 F (36.7 C)  SpO2: 99%   Filed Weights   05/30/18 0839  Weight: 146 lb 8 oz (66.5 kg)    GENERAL:alert, no distress and comfortable SKIN: skin color, texture, turgor are normal, no rashes or significant lesions EYES: normal, Conjunctiva are pink and non-injected, sclera clear OROPHARYNX:no exudate, no erythema and lips, buccal mucosa, and tongue normal  NECK: supple, thyroid normal size, non-tender, without nodularity LYMPH:  no palpable lymphadenopathy in the cervical, axillary or inguinal LUNGS: clear to auscultation and percussion with normal breathing effort HEART: regular rate & rhythm and no murmurs and no lower extremity edema ABDOMEN:abdomen soft, non-tender and normal bowel sounds Musculoskeletal:no cyanosis of digits and no clubbing  NEURO: alert & oriented x 3 with fluent speech, no focal motor/sensory deficits BREAST: S/p right mastectomy: surgical incision healed well, no palpable mass or adenopathy in either breasts   LABORATORY DATA:  I have reviewed the data as listed CBC Latest Ref Rng & Units 04/17/2018 01/16/2018 12/16/2017  WBC 4.0 - 10.5 K/uL 5.6 5.5 7.3  Hemoglobin 12.0 - 15.0 g/dL 11.7(L) 12.4 12.2  Hematocrit 36.0 - 46.0 % 36.1 36.6 36.8  Platelets 150 - 400  K/uL 212 266 256     CMP Latest Ref Rng & Units 04/17/2018 01/16/2018 12/16/2017  Glucose 70 - 99 mg/dL 113(H) 101(H) 99  BUN 6 - 20 mg/dL '13 14 14  '$ Creatinine 0.44 - 1.00 mg/dL 0.98 0.98 0.92  Sodium 135 - 145 mmol/L 143 140 140  Potassium 3.5 - 5.1 mmol/L 4.0 3.3(L) 3.8  Chloride 98 - 111 mmol/L 104 101 103  CO2 22 - 32 mmol/L 32 31 29  Calcium 8.9 - 10.3 mg/dL 10.0 10.2 10.3  Total Protein 6.5 - 8.1 g/dL 7.4 7.9 7.7  Total Bilirubin 0.3 - 1.2 mg/dL 0.4 0.5 0.3  Alkaline Phos 38 - 126 U/L 54 46 46  AST 15 - 41 U/L '23 23 22  '$ ALT 0 - 44 U/L '16 14 12      '$ RADIOGRAPHIC STUDIES: I have personally reviewed the radiological images as listed and agreed with the findings in the report. No results found.   ASSESSMENT & PLAN:  Melanie Moss is a 43 y.o. female with   1. 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 Treated with right mastectomy, radiation, adjuvant chemo and HER2 antibodies.  -She started antiestrogen therapy with Tamoxifen in 04/2016 and was switched to Letrozole on 01/17/2018 with ovarian suppression. She currently receives Zoladex injections monthly since 11/2017. She is tolerating well with manageable hot flashes and mild joint stiffness.  -Plan to have BSO in 2 months and breast reconstruction later this year. Once she completes BSO she will stop Zoladex injections.  -Labs from last month showed Hg at 11.7, BG at 113, otherwise CBC, CMP and iron panel WNL.  -Physical exam today unremarkable.  -Left mammogram in 10/2018 -Continue Letrozole daily and Zoladex monthly until BSO.  -I recommend she start multivitamin -f/u in 6 months   2. Iron deficient anemia, and anemia of chronic disease  -Latest lab show Hg at 11.7, iron panel normal. (04/17/18) -  Currently on oral iron. Continue.    -will monitor   3. HTN -f/u with PCP  4. Right arm lymphedema  -Currently uses compression cast on her right arm, and a pump from PT. -stable   5. Hot flashes and mood swings. -On Celexa 10 mg, will continue.  -tolerable  Plan  -Continue Letrozole  -Lab and f/u in 6 months  -Monthly Zoladex injection X5, or until BSO -Left mammogram at [facility] in 10/2018    No problem-specific Assessment & Plan notes found for this encounter.   Orders Placed This Encounter  Procedures  . MM DIAG BREAST TOMO UNI LEFT    Standing Status:   Future    Standing Expiration Date:   05/31/2019    Order Specific Question:   Reason for Exam (SYMPTOM  OR DIAGNOSIS REQUIRED)    Answer:   screening    Order Specific Question:   Is the patient pregnant?    Answer:   No    Order Specific Question:   Preferred imaging location?    Answer:   [facility]   All questions were answered. The patient knows to call the clinic with any problems, questions or concerns. No barriers to learning was detected. I spent 15 minutes counseling the patient face to face. The total time spent in the appointment was 20 minutes and more than 50% was on counseling and review of test results     Truitt Merle, MD 05/30/2018   I, Joslyn Devon, am acting as scribe for Truitt Merle, MD.   I have reviewed the above documentation for accuracy and completeness, and I agree with the above.

## 2018-05-30 ENCOUNTER — Telehealth: Payer: Self-pay

## 2018-05-30 ENCOUNTER — Encounter: Payer: Self-pay | Admitting: Hematology

## 2018-05-30 ENCOUNTER — Inpatient Hospital Stay: Payer: BLUE CROSS/BLUE SHIELD

## 2018-05-30 ENCOUNTER — Inpatient Hospital Stay: Payer: BLUE CROSS/BLUE SHIELD | Attending: Hematology | Admitting: Hematology

## 2018-05-30 VITALS — BP 127/83 | HR 88 | Temp 98.1°F | Resp 18 | Ht 63.0 in | Wt 146.5 lb

## 2018-05-30 DIAGNOSIS — Z79811 Long term (current) use of aromatase inhibitors: Secondary | ICD-10-CM | POA: Insufficient documentation

## 2018-05-30 DIAGNOSIS — C773 Secondary and unspecified malignant neoplasm of axilla and upper limb lymph nodes: Secondary | ICD-10-CM | POA: Diagnosis not present

## 2018-05-30 DIAGNOSIS — Z5111 Encounter for antineoplastic chemotherapy: Secondary | ICD-10-CM | POA: Diagnosis present

## 2018-05-30 DIAGNOSIS — C50511 Malignant neoplasm of lower-outer quadrant of right female breast: Secondary | ICD-10-CM | POA: Diagnosis not present

## 2018-05-30 DIAGNOSIS — D500 Iron deficiency anemia secondary to blood loss (chronic): Secondary | ICD-10-CM

## 2018-05-30 DIAGNOSIS — D509 Iron deficiency anemia, unspecified: Secondary | ICD-10-CM | POA: Insufficient documentation

## 2018-05-30 DIAGNOSIS — N951 Menopausal and female climacteric states: Secondary | ICD-10-CM | POA: Insufficient documentation

## 2018-05-30 DIAGNOSIS — Z170 Estrogen receptor positive status [ER+]: Secondary | ICD-10-CM | POA: Diagnosis not present

## 2018-05-30 DIAGNOSIS — I10 Essential (primary) hypertension: Secondary | ICD-10-CM | POA: Diagnosis not present

## 2018-05-30 MED ORDER — GOSERELIN ACETATE 3.6 MG ~~LOC~~ IMPL
3.6000 mg | DRUG_IMPLANT | Freq: Once | SUBCUTANEOUS | Status: AC
  Administered 2018-05-30: 3.6 mg via SUBCUTANEOUS

## 2018-05-30 MED ORDER — GOSERELIN ACETATE 3.6 MG ~~LOC~~ IMPL
DRUG_IMPLANT | SUBCUTANEOUS | Status: AC
  Filled 2018-05-30: qty 3.6

## 2018-05-30 NOTE — Patient Instructions (Signed)
Goserelin injection What is this medicine? GOSERELIN (GOE se rel in) is similar to a hormone found in the body. It lowers the amount of sex hormones that the body makes. Men will have lower testosterone levels and women will have lower estrogen levels while taking this medicine. In men, this medicine is used to treat prostate cancer; the injection is either given once per month or once every 12 weeks. A once per month injection (only) is used to treat women with endometriosis, dysfunctional uterine bleeding, or advanced breast cancer. This medicine may be used for other purposes; ask your health care provider or pharmacist if you have questions. COMMON BRAND NAME(S): Zoladex What should I tell my health care provider before I take this medicine? They need to know if you have any of these conditions (some only apply to women): -diabetes -heart disease or previous heart attack -high blood pressure -high cholesterol -kidney disease -osteoporosis or low bone density -problems passing urine -spinal cord injury -stroke -tobacco smoker -an unusual or allergic reaction to goserelin, hormone therapy, other medicines, foods, dyes, or preservatives -pregnant or trying to get pregnant -breast-feeding How should I use this medicine? This medicine is for injection under the skin. It is given by a health care professional in a hospital or clinic setting. Men receive this injection once every 4 weeks or once every 12 weeks. Women will only receive the once every 4 weeks injection. Talk to your pediatrician regarding the use of this medicine in children. Special care may be needed. Overdosage: If you think you have taken too much of this medicine contact a poison control center or emergency room at once. NOTE: This medicine is only for you. Do not share this medicine with others. What if I miss a dose? It is important not to miss your dose. Call your doctor or health care professional if you are unable to  keep an appointment. What may interact with this medicine? -female hormones like estrogen -herbal or dietary supplements like black cohosh, chasteberry, or DHEA -female hormones like testosterone -prasterone This list may not describe all possible interactions. Give your health care provider a list of all the medicines, herbs, non-prescription drugs, or dietary supplements you use. Also tell them if you smoke, drink alcohol, or use illegal drugs. Some items may interact with your medicine. What should I watch for while using this medicine? Visit your doctor or health care professional for regular checks on your progress. Your symptoms may appear to get worse during the first weeks of this therapy. Tell your doctor or healthcare professional if your symptoms do not start to get better or if they get worse after this time. Your bones may get weaker if you take this medicine for a long time. If you smoke or frequently drink alcohol you may increase your risk of bone loss. A family history of osteoporosis, chronic use of drugs for seizures (convulsions), or corticosteroids can also increase your risk of bone loss. Talk to your doctor about how to keep your bones strong. This medicine should stop regular monthly menstration in women. Tell your doctor if you continue to menstrate. Women should not become pregnant while taking this medicine or for 12 weeks after stopping this medicine. Women should inform their doctor if they wish to become pregnant or think they might be pregnant. There is a potential for serious side effects to an unborn child. Talk to your health care professional or pharmacist for more information. Do not breast-feed an infant while taking   this medicine. Men should inform their doctors if they wish to father a child. This medicine may lower sperm counts. Talk to your health care professional or pharmacist for more information. What side effects may I notice from receiving this  medicine? Side effects that you should report to your doctor or health care professional as soon as possible: -allergic reactions like skin rash, itching or hives, swelling of the face, lips, or tongue -bone pain -breathing problems -changes in vision -chest pain -feeling faint or lightheaded, falls -fever, chills -pain, swelling, warmth in the leg -pain, tingling, numbness in the hands or feet -signs and symptoms of low blood pressure like dizziness; feeling faint or lightheaded, falls; unusually weak or tired -stomach pain -swelling of the ankles, feet, hands -trouble passing urine or change in the amount of urine -unusually high or low blood pressure -unusually weak or tired Side effects that usually do not require medical attention (report to your doctor or health care professional if they continue or are bothersome): -change in sex drive or performance -changes in breast size in both males and females -changes in emotions or moods -headache -hot flashes -irritation at site where injected -loss of appetite -skin problems like acne, dry skin -vaginal dryness This list may not describe all possible side effects. Call your doctor for medical advice about side effects. You may report side effects to FDA at 1-800-FDA-1088. Where should I keep my medicine? This drug is given in a hospital or clinic and will not be stored at home. NOTE: This sheet is a summary. It may not cover all possible information. If you have questions about this medicine, talk to your doctor, pharmacist, or health care provider.  2019 Elsevier/Gold Standard (2013-06-16 11:10:35)  

## 2018-05-30 NOTE — Telephone Encounter (Signed)
Printed avs and calender of upcoming appointment. Per 2/7 los 

## 2018-06-03 ENCOUNTER — Ambulatory Visit: Payer: BLUE CROSS/BLUE SHIELD | Admitting: Hematology

## 2018-06-03 ENCOUNTER — Ambulatory Visit: Payer: BLUE CROSS/BLUE SHIELD

## 2018-06-14 ENCOUNTER — Ambulatory Visit: Payer: BLUE CROSS/BLUE SHIELD

## 2018-06-27 ENCOUNTER — Other Ambulatory Visit: Payer: Self-pay | Admitting: *Deleted

## 2018-06-28 ENCOUNTER — Inpatient Hospital Stay: Payer: BLUE CROSS/BLUE SHIELD | Attending: Hematology

## 2018-06-28 VITALS — BP 138/84 | HR 92 | Temp 98.0°F | Resp 17

## 2018-06-28 DIAGNOSIS — C50511 Malignant neoplasm of lower-outer quadrant of right female breast: Secondary | ICD-10-CM | POA: Insufficient documentation

## 2018-06-28 DIAGNOSIS — D500 Iron deficiency anemia secondary to blood loss (chronic): Secondary | ICD-10-CM

## 2018-06-28 DIAGNOSIS — Z5111 Encounter for antineoplastic chemotherapy: Secondary | ICD-10-CM | POA: Insufficient documentation

## 2018-06-28 MED ORDER — GOSERELIN ACETATE 3.6 MG ~~LOC~~ IMPL: 3.6000 mg | DRUG_IMPLANT | Freq: Once | SUBCUTANEOUS | Status: DC

## 2018-06-28 MED ORDER — PEGFILGRASTIM INJECTION 6 MG/0.6ML ~~LOC~~
PREFILLED_SYRINGE | SUBCUTANEOUS | Status: AC
  Filled 2018-06-28: qty 0.6

## 2018-06-30 ENCOUNTER — Inpatient Hospital Stay: Payer: BLUE CROSS/BLUE SHIELD

## 2018-06-30 DIAGNOSIS — Z5111 Encounter for antineoplastic chemotherapy: Secondary | ICD-10-CM | POA: Diagnosis present

## 2018-06-30 DIAGNOSIS — C50511 Malignant neoplasm of lower-outer quadrant of right female breast: Secondary | ICD-10-CM | POA: Diagnosis present

## 2018-06-30 DIAGNOSIS — D500 Iron deficiency anemia secondary to blood loss (chronic): Secondary | ICD-10-CM

## 2018-06-30 MED ORDER — GOSERELIN ACETATE 3.6 MG ~~LOC~~ IMPL
DRUG_IMPLANT | SUBCUTANEOUS | Status: AC
  Filled 2018-06-30: qty 3.6

## 2018-06-30 MED ORDER — GOSERELIN ACETATE 3.6 MG ~~LOC~~ IMPL
3.6000 mg | DRUG_IMPLANT | Freq: Once | SUBCUTANEOUS | Status: AC
  Administered 2018-06-30: 3.6 mg via SUBCUTANEOUS

## 2018-06-30 NOTE — Patient Instructions (Signed)
Goserelin injection What is this medicine? GOSERELIN (GOE se rel in) is similar to a hormone found in the body. It lowers the amount of sex hormones that the body makes. Men will have lower testosterone levels and women will have lower estrogen levels while taking this medicine. In men, this medicine is used to treat prostate cancer; the injection is either given once per month or once every 12 weeks. A once per month injection (only) is used to treat women with endometriosis, dysfunctional uterine bleeding, or advanced breast cancer. This medicine may be used for other purposes; ask your health care provider or pharmacist if you have questions. COMMON BRAND NAME(S): Zoladex What should I tell my health care provider before I take this medicine? They need to know if you have any of these conditions (some only apply to women): -diabetes -heart disease or previous heart attack -high blood pressure -high cholesterol -kidney disease -osteoporosis or low bone density -problems passing urine -spinal cord injury -stroke -tobacco smoker -an unusual or allergic reaction to goserelin, hormone therapy, other medicines, foods, dyes, or preservatives -pregnant or trying to get pregnant -breast-feeding How should I use this medicine? This medicine is for injection under the skin. It is given by a health care professional in a hospital or clinic setting. Men receive this injection once every 4 weeks or once every 12 weeks. Women will only receive the once every 4 weeks injection. Talk to your pediatrician regarding the use of this medicine in children. Special care may be needed. Overdosage: If you think you have taken too much of this medicine contact a poison control center or emergency room at once. NOTE: This medicine is only for you. Do not share this medicine with others. What if I miss a dose? It is important not to miss your dose. Call your doctor or health care professional if you are unable to  keep an appointment. What may interact with this medicine? -female hormones like estrogen -herbal or dietary supplements like black cohosh, chasteberry, or DHEA -female hormones like testosterone -prasterone This list may not describe all possible interactions. Give your health care provider a list of all the medicines, herbs, non-prescription drugs, or dietary supplements you use. Also tell them if you smoke, drink alcohol, or use illegal drugs. Some items may interact with your medicine. What should I watch for while using this medicine? Visit your doctor or health care professional for regular checks on your progress. Your symptoms may appear to get worse during the first weeks of this therapy. Tell your doctor or healthcare professional if your symptoms do not start to get better or if they get worse after this time. Your bones may get weaker if you take this medicine for a long time. If you smoke or frequently drink alcohol you may increase your risk of bone loss. A family history of osteoporosis, chronic use of drugs for seizures (convulsions), or corticosteroids can also increase your risk of bone loss. Talk to your doctor about how to keep your bones strong. This medicine should stop regular monthly menstration in women. Tell your doctor if you continue to menstrate. Women should not become pregnant while taking this medicine or for 12 weeks after stopping this medicine. Women should inform their doctor if they wish to become pregnant or think they might be pregnant. There is a potential for serious side effects to an unborn child. Talk to your health care professional or pharmacist for more information. Do not breast-feed an infant while taking   this medicine. Men should inform their doctors if they wish to father a child. This medicine may lower sperm counts. Talk to your health care professional or pharmacist for more information. What side effects may I notice from receiving this  medicine? Side effects that you should report to your doctor or health care professional as soon as possible: -allergic reactions like skin rash, itching or hives, swelling of the face, lips, or tongue -bone pain -breathing problems -changes in vision -chest pain -feeling faint or lightheaded, falls -fever, chills -pain, swelling, warmth in the leg -pain, tingling, numbness in the hands or feet -signs and symptoms of low blood pressure like dizziness; feeling faint or lightheaded, falls; unusually weak or tired -stomach pain -swelling of the ankles, feet, hands -trouble passing urine or change in the amount of urine -unusually high or low blood pressure -unusually weak or tired Side effects that usually do not require medical attention (report to your doctor or health care professional if they continue or are bothersome): -change in sex drive or performance -changes in breast size in both males and females -changes in emotions or moods -headache -hot flashes -irritation at site where injected -loss of appetite -skin problems like acne, dry skin -vaginal dryness This list may not describe all possible side effects. Call your doctor for medical advice about side effects. You may report side effects to FDA at 1-800-FDA-1088. Where should I keep my medicine? This drug is given in a hospital or clinic and will not be stored at home. NOTE: This sheet is a summary. It may not cover all possible information. If you have questions about this medicine, talk to your doctor, pharmacist, or health care provider.  2019 Elsevier/Gold Standard (2013-06-16 11:10:35)  

## 2018-07-12 ENCOUNTER — Ambulatory Visit: Payer: BLUE CROSS/BLUE SHIELD

## 2018-07-26 ENCOUNTER — Inpatient Hospital Stay: Payer: BLUE CROSS/BLUE SHIELD | Attending: Hematology

## 2018-07-26 ENCOUNTER — Other Ambulatory Visit: Payer: Self-pay

## 2018-07-26 VITALS — BP 126/95 | HR 83 | Temp 98.2°F | Resp 17

## 2018-07-26 DIAGNOSIS — Z5111 Encounter for antineoplastic chemotherapy: Secondary | ICD-10-CM | POA: Insufficient documentation

## 2018-07-26 DIAGNOSIS — C50511 Malignant neoplasm of lower-outer quadrant of right female breast: Secondary | ICD-10-CM | POA: Diagnosis present

## 2018-07-26 DIAGNOSIS — D500 Iron deficiency anemia secondary to blood loss (chronic): Secondary | ICD-10-CM

## 2018-07-26 MED ORDER — GOSERELIN ACETATE 3.6 MG ~~LOC~~ IMPL
DRUG_IMPLANT | SUBCUTANEOUS | Status: AC
  Filled 2018-07-26: qty 3.6

## 2018-07-26 MED ORDER — GOSERELIN ACETATE 3.6 MG ~~LOC~~ IMPL
3.6000 mg | DRUG_IMPLANT | Freq: Once | SUBCUTANEOUS | Status: AC
  Administered 2018-07-26: 3.6 mg via SUBCUTANEOUS

## 2018-07-26 NOTE — Patient Instructions (Signed)
Goserelin injection What is this medicine? GOSERELIN (GOE se rel in) is similar to a hormone found in the body. It lowers the amount of sex hormones that the body makes. Men will have lower testosterone levels and women will have lower estrogen levels while taking this medicine. In men, this medicine is used to treat prostate cancer; the injection is either given once per month or once every 12 weeks. A once per month injection (only) is used to treat women with endometriosis, dysfunctional uterine bleeding, or advanced breast cancer. This medicine may be used for other purposes; ask your health care provider or pharmacist if you have questions. COMMON BRAND NAME(S): Zoladex What should I tell my health care provider before I take this medicine? They need to know if you have any of these conditions (some only apply to women): -diabetes -heart disease or previous heart attack -high blood pressure -high cholesterol -kidney disease -osteoporosis or low bone density -problems passing urine -spinal cord injury -stroke -tobacco smoker -an unusual or allergic reaction to goserelin, hormone therapy, other medicines, foods, dyes, or preservatives -pregnant or trying to get pregnant -breast-feeding How should I use this medicine? This medicine is for injection under the skin. It is given by a health care professional in a hospital or clinic setting. Men receive this injection once every 4 weeks or once every 12 weeks. Women will only receive the once every 4 weeks injection. Talk to your pediatrician regarding the use of this medicine in children. Special care may be needed. Overdosage: If you think you have taken too much of this medicine contact a poison control center or emergency room at once. NOTE: This medicine is only for you. Do not share this medicine with others. What if I miss a dose? It is important not to miss your dose. Call your doctor or health care professional if you are unable to  keep an appointment. What may interact with this medicine? -female hormones like estrogen -herbal or dietary supplements like black cohosh, chasteberry, or DHEA -female hormones like testosterone -prasterone This list may not describe all possible interactions. Give your health care provider a list of all the medicines, herbs, non-prescription drugs, or dietary supplements you use. Also tell them if you smoke, drink alcohol, or use illegal drugs. Some items may interact with your medicine. What should I watch for while using this medicine? Visit your doctor or health care professional for regular checks on your progress. Your symptoms may appear to get worse during the first weeks of this therapy. Tell your doctor or healthcare professional if your symptoms do not start to get better or if they get worse after this time. Your bones may get weaker if you take this medicine for a long time. If you smoke or frequently drink alcohol you may increase your risk of bone loss. A family history of osteoporosis, chronic use of drugs for seizures (convulsions), or corticosteroids can also increase your risk of bone loss. Talk to your doctor about how to keep your bones strong. This medicine should stop regular monthly menstration in women. Tell your doctor if you continue to menstrate. Women should not become pregnant while taking this medicine or for 12 weeks after stopping this medicine. Women should inform their doctor if they wish to become pregnant or think they might be pregnant. There is a potential for serious side effects to an unborn child. Talk to your health care professional or pharmacist for more information. Do not breast-feed an infant while taking   this medicine. Men should inform their doctors if they wish to father a child. This medicine may lower sperm counts. Talk to your health care professional or pharmacist for more information. What side effects may I notice from receiving this  medicine? Side effects that you should report to your doctor or health care professional as soon as possible: -allergic reactions like skin rash, itching or hives, swelling of the face, lips, or tongue -bone pain -breathing problems -changes in vision -chest pain -feeling faint or lightheaded, falls -fever, chills -pain, swelling, warmth in the leg -pain, tingling, numbness in the hands or feet -signs and symptoms of low blood pressure like dizziness; feeling faint or lightheaded, falls; unusually weak or tired -stomach pain -swelling of the ankles, feet, hands -trouble passing urine or change in the amount of urine -unusually high or low blood pressure -unusually weak or tired Side effects that usually do not require medical attention (report to your doctor or health care professional if they continue or are bothersome): -change in sex drive or performance -changes in breast size in both males and females -changes in emotions or moods -headache -hot flashes -irritation at site where injected -loss of appetite -skin problems like acne, dry skin -vaginal dryness This list may not describe all possible side effects. Call your doctor for medical advice about side effects. You may report side effects to FDA at 1-800-FDA-1088. Where should I keep my medicine? This drug is given in a hospital or clinic and will not be stored at home. NOTE: This sheet is a summary. It may not cover all possible information. If you have questions about this medicine, talk to your doctor, pharmacist, or health care provider.  2019 Elsevier/Gold Standard (2013-06-16 11:10:35)  

## 2018-08-07 ENCOUNTER — Ambulatory Visit: Payer: BLUE CROSS/BLUE SHIELD | Admitting: Hematology

## 2018-08-07 ENCOUNTER — Other Ambulatory Visit: Payer: BLUE CROSS/BLUE SHIELD

## 2018-08-09 ENCOUNTER — Ambulatory Visit: Payer: BLUE CROSS/BLUE SHIELD

## 2018-08-29 ENCOUNTER — Inpatient Hospital Stay: Payer: BC Managed Care – PPO | Attending: Hematology

## 2018-08-29 ENCOUNTER — Other Ambulatory Visit: Payer: Self-pay

## 2018-08-29 DIAGNOSIS — Z9221 Personal history of antineoplastic chemotherapy: Secondary | ICD-10-CM | POA: Insufficient documentation

## 2018-08-29 DIAGNOSIS — D638 Anemia in other chronic diseases classified elsewhere: Secondary | ICD-10-CM | POA: Insufficient documentation

## 2018-08-29 DIAGNOSIS — I10 Essential (primary) hypertension: Secondary | ICD-10-CM | POA: Insufficient documentation

## 2018-08-29 DIAGNOSIS — Z79811 Long term (current) use of aromatase inhibitors: Secondary | ICD-10-CM | POA: Insufficient documentation

## 2018-08-29 DIAGNOSIS — C50511 Malignant neoplasm of lower-outer quadrant of right female breast: Secondary | ICD-10-CM | POA: Diagnosis present

## 2018-08-29 DIAGNOSIS — R232 Flushing: Secondary | ICD-10-CM | POA: Insufficient documentation

## 2018-08-29 DIAGNOSIS — F39 Unspecified mood [affective] disorder: Secondary | ICD-10-CM | POA: Insufficient documentation

## 2018-08-29 DIAGNOSIS — Z170 Estrogen receptor positive status [ER+]: Secondary | ICD-10-CM | POA: Diagnosis not present

## 2018-08-29 DIAGNOSIS — Z923 Personal history of irradiation: Secondary | ICD-10-CM | POA: Insufficient documentation

## 2018-08-29 DIAGNOSIS — Z9011 Acquired absence of right breast and nipple: Secondary | ICD-10-CM | POA: Diagnosis not present

## 2018-08-29 DIAGNOSIS — D500 Iron deficiency anemia secondary to blood loss (chronic): Secondary | ICD-10-CM

## 2018-08-29 DIAGNOSIS — I890 Lymphedema, not elsewhere classified: Secondary | ICD-10-CM | POA: Insufficient documentation

## 2018-08-29 DIAGNOSIS — Z5111 Encounter for antineoplastic chemotherapy: Secondary | ICD-10-CM | POA: Diagnosis present

## 2018-08-29 MED ORDER — GOSERELIN ACETATE 3.6 MG ~~LOC~~ IMPL
3.6000 mg | DRUG_IMPLANT | Freq: Once | SUBCUTANEOUS | Status: AC
  Administered 2018-08-29: 3.6 mg via SUBCUTANEOUS

## 2018-08-29 NOTE — Patient Instructions (Signed)
Goserelin injection What is this medicine? GOSERELIN (GOE se rel in) is similar to a hormone found in the body. It lowers the amount of sex hormones that the body makes. Men will have lower testosterone levels and women will have lower estrogen levels while taking this medicine. In men, this medicine is used to treat prostate cancer; the injection is either given once per month or once every 12 weeks. A once per month injection (only) is used to treat women with endometriosis, dysfunctional uterine bleeding, or advanced breast cancer. This medicine may be used for other purposes; ask your health care provider or pharmacist if you have questions. COMMON BRAND NAME(S): Zoladex What should I tell my health care provider before I take this medicine? They need to know if you have any of these conditions (some only apply to women): -diabetes -heart disease or previous heart attack -high blood pressure -high cholesterol -kidney disease -osteoporosis or low bone density -problems passing urine -spinal cord injury -stroke -tobacco smoker -an unusual or allergic reaction to goserelin, hormone therapy, other medicines, foods, dyes, or preservatives -pregnant or trying to get pregnant -breast-feeding How should I use this medicine? This medicine is for injection under the skin. It is given by a health care professional in a hospital or clinic setting. Men receive this injection once every 4 weeks or once every 12 weeks. Women will only receive the once every 4 weeks injection. Talk to your pediatrician regarding the use of this medicine in children. Special care may be needed. Overdosage: If you think you have taken too much of this medicine contact a poison control center or emergency room at once. NOTE: This medicine is only for you. Do not share this medicine with others. What if I miss a dose? It is important not to miss your dose. Call your doctor or health care professional if you are unable to  keep an appointment. What may interact with this medicine? -female hormones like estrogen -herbal or dietary supplements like black cohosh, chasteberry, or DHEA -female hormones like testosterone -prasterone This list may not describe all possible interactions. Give your health care provider a list of all the medicines, herbs, non-prescription drugs, or dietary supplements you use. Also tell them if you smoke, drink alcohol, or use illegal drugs. Some items may interact with your medicine. What should I watch for while using this medicine? Visit your doctor or health care professional for regular checks on your progress. Your symptoms may appear to get worse during the first weeks of this therapy. Tell your doctor or healthcare professional if your symptoms do not start to get better or if they get worse after this time. Your bones may get weaker if you take this medicine for a long time. If you smoke or frequently drink alcohol you may increase your risk of bone loss. A family history of osteoporosis, chronic use of drugs for seizures (convulsions), or corticosteroids can also increase your risk of bone loss. Talk to your doctor about how to keep your bones strong. This medicine should stop regular monthly menstration in women. Tell your doctor if you continue to menstrate. Women should not become pregnant while taking this medicine or for 12 weeks after stopping this medicine. Women should inform their doctor if they wish to become pregnant or think they might be pregnant. There is a potential for serious side effects to an unborn child. Talk to your health care professional or pharmacist for more information. Do not breast-feed an infant while taking   this medicine. Men should inform their doctors if they wish to father a child. This medicine may lower sperm counts. Talk to your health care professional or pharmacist for more information. What side effects may I notice from receiving this  medicine? Side effects that you should report to your doctor or health care professional as soon as possible: -allergic reactions like skin rash, itching or hives, swelling of the face, lips, or tongue -bone pain -breathing problems -changes in vision -chest pain -feeling faint or lightheaded, falls -fever, chills -pain, swelling, warmth in the leg -pain, tingling, numbness in the hands or feet -signs and symptoms of low blood pressure like dizziness; feeling faint or lightheaded, falls; unusually weak or tired -stomach pain -swelling of the ankles, feet, hands -trouble passing urine or change in the amount of urine -unusually high or low blood pressure -unusually weak or tired Side effects that usually do not require medical attention (report to your doctor or health care professional if they continue or are bothersome): -change in sex drive or performance -changes in breast size in both males and females -changes in emotions or moods -headache -hot flashes -irritation at site where injected -loss of appetite -skin problems like acne, dry skin -vaginal dryness This list may not describe all possible side effects. Call your doctor for medical advice about side effects. You may report side effects to FDA at 1-800-FDA-1088. Where should I keep my medicine? This drug is given in a hospital or clinic and will not be stored at home. NOTE: This sheet is a summary. It may not cover all possible information. If you have questions about this medicine, talk to your doctor, pharmacist, or health care provider.  2019 Elsevier/Gold Standard (2013-06-16 11:10:35)  

## 2018-08-30 ENCOUNTER — Inpatient Hospital Stay: Payer: BC Managed Care – PPO

## 2018-09-27 ENCOUNTER — Other Ambulatory Visit: Payer: Self-pay

## 2018-09-27 ENCOUNTER — Inpatient Hospital Stay: Payer: BC Managed Care – PPO | Attending: Hematology

## 2018-09-27 VITALS — BP 142/98 | HR 93 | Temp 99.1°F | Resp 18

## 2018-09-27 DIAGNOSIS — D500 Iron deficiency anemia secondary to blood loss (chronic): Secondary | ICD-10-CM

## 2018-09-27 DIAGNOSIS — C50511 Malignant neoplasm of lower-outer quadrant of right female breast: Secondary | ICD-10-CM | POA: Insufficient documentation

## 2018-09-27 DIAGNOSIS — Z5111 Encounter for antineoplastic chemotherapy: Secondary | ICD-10-CM | POA: Diagnosis not present

## 2018-09-27 MED ORDER — GOSERELIN ACETATE 3.6 MG ~~LOC~~ IMPL
3.6000 mg | DRUG_IMPLANT | Freq: Once | SUBCUTANEOUS | Status: AC
  Administered 2018-09-27: 3.6 mg via SUBCUTANEOUS

## 2018-09-27 MED ORDER — GOSERELIN ACETATE 3.6 MG ~~LOC~~ IMPL
DRUG_IMPLANT | SUBCUTANEOUS | Status: AC
  Filled 2018-09-27: qty 3.6

## 2018-09-27 NOTE — Patient Instructions (Signed)
Goserelin injection What is this medicine? GOSERELIN (GOE se rel in) is similar to a hormone found in the body. It lowers the amount of sex hormones that the body makes. Men will have lower testosterone levels and women will have lower estrogen levels while taking this medicine. In men, this medicine is used to treat prostate cancer; the injection is either given once per month or once every 12 weeks. A once per month injection (only) is used to treat women with endometriosis, dysfunctional uterine bleeding, or advanced breast cancer. This medicine may be used for other purposes; ask your health care provider or pharmacist if you have questions. COMMON BRAND NAME(S): Zoladex What should I tell my health care provider before I take this medicine? They need to know if you have any of these conditions (some only apply to women): -diabetes -heart disease or previous heart attack -high blood pressure -high cholesterol -kidney disease -osteoporosis or low bone density -problems passing urine -spinal cord injury -stroke -tobacco smoker -an unusual or allergic reaction to goserelin, hormone therapy, other medicines, foods, dyes, or preservatives -pregnant or trying to get pregnant -breast-feeding How should I use this medicine? This medicine is for injection under the skin. It is given by a health care professional in a hospital or clinic setting. Men receive this injection once every 4 weeks or once every 12 weeks. Women will only receive the once every 4 weeks injection. Talk to your pediatrician regarding the use of this medicine in children. Special care may be needed. Overdosage: If you think you have taken too much of this medicine contact a poison control center or emergency room at once. NOTE: This medicine is only for you. Do not share this medicine with others. What if I miss a dose? It is important not to miss your dose. Call your doctor or health care professional if you are unable to  keep an appointment. What may interact with this medicine? -female hormones like estrogen -herbal or dietary supplements like black cohosh, chasteberry, or DHEA -female hormones like testosterone -prasterone This list may not describe all possible interactions. Give your health care provider a list of all the medicines, herbs, non-prescription drugs, or dietary supplements you use. Also tell them if you smoke, drink alcohol, or use illegal drugs. Some items may interact with your medicine. What should I watch for while using this medicine? Visit your doctor or health care professional for regular checks on your progress. Your symptoms may appear to get worse during the first weeks of this therapy. Tell your doctor or healthcare professional if your symptoms do not start to get better or if they get worse after this time. Your bones may get weaker if you take this medicine for a long time. If you smoke or frequently drink alcohol you may increase your risk of bone loss. A family history of osteoporosis, chronic use of drugs for seizures (convulsions), or corticosteroids can also increase your risk of bone loss. Talk to your doctor about how to keep your bones strong. This medicine should stop regular monthly menstration in women. Tell your doctor if you continue to menstrate. Women should not become pregnant while taking this medicine or for 12 weeks after stopping this medicine. Women should inform their doctor if they wish to become pregnant or think they might be pregnant. There is a potential for serious side effects to an unborn child. Talk to your health care professional or pharmacist for more information. Do not breast-feed an infant while taking   this medicine. Men should inform their doctors if they wish to father a child. This medicine may lower sperm counts. Talk to your health care professional or pharmacist for more information. What side effects may I notice from receiving this  medicine? Side effects that you should report to your doctor or health care professional as soon as possible: -allergic reactions like skin rash, itching or hives, swelling of the face, lips, or tongue -bone pain -breathing problems -changes in vision -chest pain -feeling faint or lightheaded, falls -fever, chills -pain, swelling, warmth in the leg -pain, tingling, numbness in the hands or feet -signs and symptoms of low blood pressure like dizziness; feeling faint or lightheaded, falls; unusually weak or tired -stomach pain -swelling of the ankles, feet, hands -trouble passing urine or change in the amount of urine -unusually high or low blood pressure -unusually weak or tired Side effects that usually do not require medical attention (report to your doctor or health care professional if they continue or are bothersome): -change in sex drive or performance -changes in breast size in both males and females -changes in emotions or moods -headache -hot flashes -irritation at site where injected -loss of appetite -skin problems like acne, dry skin -vaginal dryness This list may not describe all possible side effects. Call your doctor for medical advice about side effects. You may report side effects to FDA at 1-800-FDA-1088. Where should I keep my medicine? This drug is given in a hospital or clinic and will not be stored at home. NOTE: This sheet is a summary. It may not cover all possible information. If you have questions about this medicine, talk to your doctor, pharmacist, or health care provider.  2019 Elsevier/Gold Standard (2013-06-16 11:10:35)  

## 2018-11-01 ENCOUNTER — Ambulatory Visit: Payer: BLUE CROSS/BLUE SHIELD

## 2018-11-01 ENCOUNTER — Inpatient Hospital Stay: Payer: BC Managed Care – PPO | Attending: Hematology

## 2018-11-01 ENCOUNTER — Other Ambulatory Visit: Payer: Self-pay

## 2018-11-01 VITALS — BP 129/93 | HR 90 | Temp 98.0°F | Resp 18

## 2018-11-01 DIAGNOSIS — Z5111 Encounter for antineoplastic chemotherapy: Secondary | ICD-10-CM | POA: Insufficient documentation

## 2018-11-01 DIAGNOSIS — D500 Iron deficiency anemia secondary to blood loss (chronic): Secondary | ICD-10-CM

## 2018-11-01 DIAGNOSIS — C50511 Malignant neoplasm of lower-outer quadrant of right female breast: Secondary | ICD-10-CM | POA: Diagnosis present

## 2018-11-01 MED ORDER — GOSERELIN ACETATE 3.6 MG ~~LOC~~ IMPL
3.6000 mg | DRUG_IMPLANT | Freq: Once | SUBCUTANEOUS | Status: AC
  Administered 2018-11-01: 3.6 mg via SUBCUTANEOUS

## 2018-11-01 MED ORDER — GOSERELIN ACETATE 3.6 MG ~~LOC~~ IMPL
DRUG_IMPLANT | SUBCUTANEOUS | Status: AC
  Filled 2018-11-01: qty 3.6

## 2018-11-01 NOTE — Patient Instructions (Signed)

## 2018-11-17 ENCOUNTER — Other Ambulatory Visit: Payer: Self-pay

## 2018-11-17 ENCOUNTER — Ambulatory Visit
Admission: RE | Admit: 2018-11-17 | Discharge: 2018-11-17 | Disposition: A | Discharge status: Home / Self Care | Payer: 59 | Source: Ambulatory Visit | Attending: Hematology | Admitting: Hematology

## 2018-11-17 DIAGNOSIS — C50511 Malignant neoplasm of lower-outer quadrant of right female breast: Secondary | ICD-10-CM

## 2018-11-17 DIAGNOSIS — Z170 Estrogen receptor positive status [ER+]: Secondary | ICD-10-CM

## 2018-11-26 NOTE — Progress Notes (Signed)
[place]   [phone] [phone]   Clinic Follow up Note   Patient Care Team: Lucianne Lei, MD as PCP - General (Family Medicine) Excell Seltzer, MD as Consulting Physician (General Surgery) Truitt Merle, MD as Consulting Physician (Hematology) Gardenia Phlegm, NP as Nurse Practitioner (Hematology and Oncology) [name], MD as Consulting Physician (Radiation Oncology)  Date of Service:  11/28/2018  CHIEF COMPLAINT: F/u on right breast cancer  SUMMARY OF ONCOLOGIC HISTORY: Oncology History Overview Note  Breast cancer of lower-outer quadrant of right female breast [facility])   Staging form: Breast, AJCC 7th Edition     Clinical stage from 08/03/2015: Stage IA (T1c, N0, M0) - Unsigned     Pathologic stage from 09/29/2015: Stage IIIA (T3(m), N1a, cM0) - Signed by Truitt Merle, MD on 10/17/2015       Breast cancer of lower-outer quadrant of right female breast ([place])  07/25/2015 Mammogram   diagnostic mammogram and ultrasound showed a 11 mm mass in the 8:00 position of the right breast, small irregular lesion in the retroareolar regio of the right breast, contiguous with the dermis   07/26/2015 Initial Diagnosis   Breast cancer of lower-outer quadrant of right female breast ([place])   07/26/2015 Initial Biopsy   right breast needle core biopsy at 8:00 position (1), invasive ductal carcinoma, grade 3, 6:30 o'clock position invasive (2) and in situ ductal carcinoma, grade 1-2, lymphovascular invasion is identified   07/26/2015 Receptors her2   (1) ER 95% positive, PR 95% positive, Ki-67 30%, HER-2 positive with ratio 2.04, copy # 5.20, (2) ER 90% positive, PR 95% positive, Ki-67 10%, HER-2 negative.   08/02/2015 Imaging   breast MRI showed extensive mass  and non-mass enhancement in the lower outer quadrant of the right breast  spanning 13 cm, focal enhancement within the right nipple, at least 3-4 abnormal appearing right axillary lymph node.    09/29/2015 Surgery   right mastectomy and axillar node dissection    09/29/2015 Pathology Results   Right mastectomy and axillary node dissection showed multifocal mammary carcinoma with ductal and lobular features, tumor size 13 cm, 2.1 cm, 1.9 cm, and 0.5 cm, grade 2-3, anterior margins were positive, 2 nodes with metastasis and one with isolated cells   11/01/2015 - 02/14/2016 Chemotherapy   TCHP every 3 weeks, for 6 cycles, followed by herceptin maintenance therapy for a total of one year    03/06/2016 - 11/01/2016 Antibody Plan   Maintenance Herceptin and perjeta, every 3 weeks   03/07/2016 - 04/27/2016 Radiation Therapy   03/07/16-04/27/16 Site/dose: 1) 55fd right chest wall/ 45 Gy in 25 fractions 2) Right mastectomy scar / 16 Gy in 8 fractions   05/21/2016 -  Anti-estrogen oral therapy   Tamoxifen 20 mg daily starting 05/21/16 Switched to Letrozole 2.'5mg'$  daily on 01/17/18.  Started monthyl Zolodex injections on 12/16/17   10/17/2016 Mammogram   IMPRESSION: No mammographic evidence of malignancy. A result letter of this screening mammogram will be mailed directly to the patient.   10/22/2016 Echocardiogram   Impressions:  - Normal study. Stable GLPSS at -21%. LVEF 60-65%.   11/09/2016 - 11/09/2017 Anti-estrogen oral therapy    Neratinib '240mg'$  daily started on 11/09/2016. Completed in one year   02/04/2017 Survivorship   Survivorship Clinic with CGardenia Phlegm NP   03/11/2017 Surgery   REMOVAL OF RIGHT TISSUE EXPANDERS WITH PLACEMRIGHT SILICONE  BREAST IMPLANTS and left MASTOPEXY FOR SYMENTRY by Dr. DMarla Roe 03/11/17   03/11/2017 Pathology  Results   Diagnosis 03/11/17 1. Breast, capsule, right inferior BENIGN FIBROVASCULAR TISSUE 2. Breast, Mammoplasty, left BENIGN BREAST TISSUE NEGATIVE FOR ATYPIA OR MALIGNANCY      CURRENT THERAPY:  Letrozole2.'5mg'$  daily startedon 9/27//2019 Zoladexmonthly injection started on December 16, 2017  INTERVAL HISTORY:   Melanie Moss is here for a follow up right breast cancer. She was last seen by me 6 months ago. She presents to the clinic alone. She notes she is doing well. She is still working from home. She is taking letrozole. She has joint pain in the morning and legs and feet hurt during the day (2-3/10). This improves when she is active. She has not needed to take medication for this and does not limit what she does. She notes she has significant hot flashes which is during the day and night. It does impact her sleep. She denies mood changes. She has Celexa but does not use it because it makes her very drowsy.  She plans to see her Gyn in 12/2018. She plans to proceed with BSO and reconstruction after COVID.  She notes a right thigh rash of her skin.    REVIEW OF SYSTEMS:   Constitutional: Denies fevers, chills or abnormal weight loss (+) heavy hot flashes  Eyes: Denies blurriness of vision Ears, nose, mouth, throat, and face: Denies mucositis or sore throat Respiratory: Denies cough, dyspnea or wheezes Cardiovascular: Denies palpitation, chest discomfort or lower extremity swelling Gastrointestinal:  Denies nausea, heartburn or change in bowel habits Skin: (+) right thigh rash MSK: (+) joint stiffness and pain (2-3/10) Lymphatics: Denies new lymphadenopathy or easy bruising Neurological:Denies numbness, tingling or new weaknesses Behavioral/Psych: Mood is stable, no new changes  All other systems were reviewed with the patient and are negative.  MEDICAL HISTORY:  Past Medical History:  Diagnosis Date  . Anemia   . Anxiety   . Breast cancer of lower-outer quadrant of right female breast ([place]) 07/29/2015  . Family history of breast cancer   . Family history of colon cancer   . Family history of prostate cancer   . GERD (gastroesophageal reflux disease)    tums   . History of radiation therapy 03/07/16-04/27/16   right chest wall 45 Gy in 25 fractions, right mastectomy scar 16 Gy in 8 fractions   . Hyperlipidemia   . Hypertension   . Lymphedema   . Personal history of chemotherapy   . Personal history of radiation therapy   . Preterm labor     SURGICAL HISTORY: Past Surgical History:  Procedure Laterality Date  . AXILLARY LYMPH NODE DISSECTION Right 09/29/2015   Procedure: AXILLARY LYMPH NODE DISSECTION;  Surgeon: Excell Seltzer, MD;  Location: [place];  Service: General;  Laterality: Right;  . BREAST IMPLANT REMOVAL Right 06/01/2017   Procedure: EXPLANT OF RIGHT 480 MENTOR BREAST IMPLANT;  Surgeon: Wallace Going, DO;  Location: [place];  Service: Plastics;  Laterality: Right;  . BREAST RECONSTRUCTION Right 08/08/2017   Procedure: RIGHT BREAST RECONSTRUCTION WITH LATISSMA MYOCUTANEOUS FLAP AND EXPANDER PLACEMENT;  Surgeon: Wallace Going, DO;  Location: [place];  Service: Plastics;  Laterality: Right;  . BREAST RECONSTRUCTION WITH PLACEMENT OF TISSUE EXPANDER AND FLEX HD (ACELLULAR HYDRATED DERMIS) Right 09/29/2015   Procedure: BREAST RECONSTRUCTION WITH PLACEMENT OF TISSUE EXPANDER AND FLEX HD (ACELLULAR HYDRATED DERMIS);  Surgeon: Loel Lofty Dillingham, DO;  Location: [place];  Service: Plastics;  Laterality: Right;  . CERVICAL CERCLAGE  02/19/2011   Procedure: CERCLAGE CERVICAL;  Surgeon: Kathie Dike  Delsa Sale, MD;  Location: [facility];  Service: Gynecology;  Laterality: N/A;  . DILATION AND CURETTAGE OF UTERUS    . INCISION AND DRAINAGE OF WOUND Right 04/19/2017   Procedure: IRRIGATION OF RIGHT BREAST POCKET;  Surgeon: Wallace Going, DO;  Location: [place];  Service: Plastics;  Laterality: Right;  . LATISSIMUS FLAP TO BREAST Right 08/08/2017   Procedure: LATISSIMUS FLAP TO BREAST;  Surgeon: Wallace Going, DO;  Location: [place];  Service: Plastics;  Laterality: Right;  . MASTECTOMY Right 2017   right 2017  . MASTECTOMY WITH AXILLARY LYMPH NODE DISSECTION Right 09/29/2015  . MASTOPEXY Left 03/11/2017   Procedure: MASTOPEXY FOR SYMENTRY;  Surgeon:  Wallace Going, DO;  Location: [place];  Service: Plastics;  Laterality: Left;  . PORT-A-CATH REMOVAL  10/2016  . PORTACATH PLACEMENT N/A 09/29/2015   Procedure: INSERTION PORT-A-CATH;  Surgeon: Excell Seltzer, MD;  Location: [place];  Service: General;  Laterality: N/A;  . REMOVAL OF BILATERAL TISSUE EXPANDERS WITH PLACEMENT OF BILATERAL BREAST IMPLANTS Right 03/11/2017   Procedure: REMOVAL OF RIGHT TISSUE EXPANDERS WITH PLACEMRIGHT SILICONE  BREAST IMPLANTS;  Surgeon: Wallace Going, DO;  Location: [place];  Service: Plastics;  Laterality: Right;  . REMOVAL OF TISSUE EXPANDER AND PLACEMENT OF IMPLANT Right 10/19/2017   Procedure: REMOVAL OF TISSUE EXPANDER;  Surgeon: Wallace Going, DO;  Location: [place];  Service: Plastics;  Laterality: Right;  . SIMPLE MASTECTOMY WITH AXILLARY SENTINEL NODE BIOPSY Right 09/29/2015   Procedure: RIGHT TOTAL  MASTECTOMY WITH AXILLARY SENTINEL NODE BIOPSY;  Surgeon: Excell Seltzer, MD;  Location: [place];  Service: General;  Laterality: Right;    I have reviewed the social history and family history with the patient and they are unchanged from previous note.  ALLERGIES:  has No Known Allergies.  MEDICATIONS:  Current Outpatient Medications  Medication Sig Dispense Refill  . B Complex-C (SUPER B COMPLEX PO) Take 1 tablet by mouth daily.    . irbesartan-hydrochlorothiazide (AVALIDE) 150-12.5 MG tablet Take 1 tablet by mouth daily.    . Iron-FA-B Cmp-C-Biot-Probiotic (FUSION PLUS PO) Take 1 tablet by mouth daily.    Marland Kitchen letrozole (FEMARA) 2.5 MG tablet Take 1 tablet (2.5 mg total) by mouth daily. 90 tablet 3   No current facility-administered medications for this visit.    Facility-Administered Medications Ordered in Other Visits  Medication Dose Route Frequency Provider Last Rate Last Dose  . 0.9 %  sodium chloride infusion   Intravenous Once Truitt Merle, MD      . alteplase (CATHFLO ACTIVASE) injection 2 mg  2  mg Intracatheter Once PRN Truitt Merle, MD      . heparin lock flush 100 unit/mL  250 Units Intracatheter Once PRN Truitt Merle, MD      . heparin lock flush 100 unit/mL  500 Units Intravenous Once PRN Truitt Merle, MD      . sodium chloride 0.9 % injection 10 mL  10 mL Intracatheter PRN Truitt Merle, MD      . sodium chloride 0.9 % injection 10 mL  10 mL Intravenous PRN Truitt Merle, MD      . sodium chloride 0.9 % injection 3 mL  3 mL Intravenous Once PRN Truitt Merle, MD      . sodium chloride flush (NS) 0.9 % injection 10 mL  10 mL Intravenous PRN Truitt Merle, MD        PHYSICAL EXAMINATION: ECOG PERFORMANCE STATUS: 0 - Asymptomatic  Vitals:   11/28/18 0828  BP: (!) 127/93  Pulse: 84  Resp: 18  Temp: 98.3 F (36.8 C)  SpO2: 100%   Filed Weights   11/28/18 0828  Weight: 157 lb (71.2 kg)    GENERAL:alert, no distress and comfortable SKIN: skin color, texture, turgor are normal, no rashes or significant lesions EYES: normal, Conjunctiva are pink and non-injected, sclera clear  NECK: supple, thyroid normal size, non-tender, without nodularity LYMPH:  no palpable lymphadenopathy in the cervical, axillary  LUNGS: clear to auscultation and percussion with normal breathing effort HEART: regular rate & rhythm and no murmurs and no lower extremity edema ABDOMEN:abdomen soft, non-tender and normal bowel sounds Musculoskeletal:no cyanosis of digits and no clubbing  NEURO: alert & oriented x 3 with fluent speech, no focal motor/sensory deficits BREAST: S/p right mastectomy: Surgical incision healed well. No palpable mass, nodules or adenopathy bilaterally. Left breast exam benign.   LABORATORY DATA:  I have reviewed the data as listed CBC Latest Ref Rng & Units 11/28/2018 04/17/2018 01/16/2018  WBC 4.0 - 10.5 K/uL 5.9 5.6 5.5  Hemoglobin 12.0 - 15.0 g/dL 12.0 11.7(L) 12.4  Hematocrit 36.0 - 46.0 % 36.3 36.1 36.6  Platelets 150 - 400 K/uL 279 212 266     CMP Latest Ref Rng & Units 11/28/2018 04/17/2018  01/16/2018  Glucose 70 - 99 mg/dL 98 113(H) 101(H)  BUN 6 - 20 mg/dL '12 13 14  '$ Creatinine 0.44 - 1.00 mg/dL 0.98 0.98 0.98  Sodium 135 - 145 mmol/L 142 143 140  Potassium 3.5 - 5.1 mmol/L 3.6 4.0 3.3(L)  Chloride 98 - 111 mmol/L 102 104 101  CO2 22 - 32 mmol/L 27 32 31  Calcium 8.9 - 10.3 mg/dL 10.8(H) 10.0 10.2  Total Protein 6.5 - 8.1 g/dL 7.8 7.4 7.9  Total Bilirubin 0.3 - 1.2 mg/dL 0.6 0.4 0.5  Alkaline Phos 38 - 126 U/L 87 54 46  AST 15 - 41 U/L '26 23 23  '$ ALT 0 - 44 U/L '17 16 14      '$ RADIOGRAPHIC STUDIES: I have personally reviewed the radiological images as listed and agreed with the findings in the report. No results found.   ASSESSMENT & PLAN:  CASSANDRE OLEKSY is a 43 y.o. female with   1. 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 Treated with right mastectomy, radiation,adjuvant chemo and HER2 antibodies.  -She started antiestrogen therapy with Tamoxifen in 04/2016 and was switched to Letrozole on 01/17/2018 with ovarian suppression. She currently receivesZoladex injections monthly since 11/2017.She is tolerating moderately well with joint pain but hot flashes now significant.  -Due to COVID-19 her BSO and breast reconstruction was post-poned. Once she completes BSO she will stop Zoladex injections.  -She is clinically doing well. Lab reviewed, her CBC and CMP are within normal limits. Iron panel still pending. Her physical exam and her 11/17/18 mammogram were unremarkable. There is no clinical concern for recurrence. -Continue Surveillance. Next mammogram in 10/2019 -Continue Letrozole daily and Zoladex monthly until BSO.  -f/u in 6 months  -For her recent skin rash of right thigh, I suggest a OTC benadryl and hydrocortisone cream if it is itching.   2. Iron deficient anemia, and anemia  of chronic disease -Anemia resolved. Her iron panel is still pending. (11/28/18) -She no longer has menstrual period on Zoladex injection and AI.  -If her  iron panel today is normal, I will stop checking it.  -Currently on oral iron. Continue.   3. HTN -f/u with PCP -BP 127/93 today (11/28/18)  4. Right arm lymphedema  -Currently uses compression cast on her right arm, and a pump from PT. -stable   5. Hot flashes, joint stiffness and pain -Secondary to AI -Joint pain is tolerable.  -She has tried Gabapentin and Celexa but they made her too drowsy and Effexor made her loopy, so she stopped.  -She can take OTC join pain if needed. She has not needed any pain medication so far. If not tolerable may switch her to Exemestane.  -I encouraged her to use Melatonin to help her sleep. I discussed over time her hot flashes and joint pain will improve.    Plan -She is clinically doing well -Continue Letrozole  -Lab and f/u in 6 months  -Monthly Zoladex injection until BSO   No problem-specific Assessment & Plan notes found for this encounter.   No orders of the defined types were placed in this encounter.  All questions were answered. The patient knows to call the clinic with any problems, questions or concerns. No barriers to learning was detected. I spent 15 minutes counseling the patient face to face. The total time spent in the appointment was 20 minutes and more than 50% was on counseling and review of test results     Truitt Merle, MD 11/28/2018   I, Joslyn Devon, am acting as scribe for Truitt Merle, MD.   I have reviewed the above documentation for accuracy and completeness, and I agree with the above.

## 2018-11-28 ENCOUNTER — Telehealth: Payer: Self-pay | Admitting: Hematology

## 2018-11-28 ENCOUNTER — Other Ambulatory Visit: Payer: Self-pay

## 2018-11-28 ENCOUNTER — Ambulatory Visit: Payer: BLUE CROSS/BLUE SHIELD

## 2018-11-28 ENCOUNTER — Inpatient Hospital Stay: Payer: 59

## 2018-11-28 ENCOUNTER — Inpatient Hospital Stay (HOSPITAL_BASED_OUTPATIENT_CLINIC_OR_DEPARTMENT_OTHER): Payer: 59 | Admitting: Hematology

## 2018-11-28 ENCOUNTER — Inpatient Hospital Stay: Payer: 59 | Attending: Hematology

## 2018-11-28 VITALS — BP 127/93 | HR 84 | Temp 98.3°F | Resp 18 | Ht 63.0 in | Wt 157.0 lb

## 2018-11-28 DIAGNOSIS — Z170 Estrogen receptor positive status [ER+]: Secondary | ICD-10-CM

## 2018-11-28 DIAGNOSIS — D500 Iron deficiency anemia secondary to blood loss (chronic): Secondary | ICD-10-CM

## 2018-11-28 DIAGNOSIS — Z79811 Long term (current) use of aromatase inhibitors: Secondary | ICD-10-CM | POA: Insufficient documentation

## 2018-11-28 DIAGNOSIS — R21 Rash and other nonspecific skin eruption: Secondary | ICD-10-CM | POA: Diagnosis not present

## 2018-11-28 DIAGNOSIS — I10 Essential (primary) hypertension: Secondary | ICD-10-CM

## 2018-11-28 DIAGNOSIS — I890 Lymphedema, not elsewhere classified: Secondary | ICD-10-CM | POA: Diagnosis not present

## 2018-11-28 DIAGNOSIS — C50511 Malignant neoplasm of lower-outer quadrant of right female breast: Secondary | ICD-10-CM | POA: Diagnosis present

## 2018-11-28 DIAGNOSIS — D509 Iron deficiency anemia, unspecified: Secondary | ICD-10-CM | POA: Insufficient documentation

## 2018-11-28 DIAGNOSIS — Z5111 Encounter for antineoplastic chemotherapy: Secondary | ICD-10-CM | POA: Diagnosis not present

## 2018-11-28 DIAGNOSIS — N951 Menopausal and female climacteric states: Secondary | ICD-10-CM | POA: Diagnosis not present

## 2018-11-28 LAB — COMPREHENSIVE METABOLIC PANEL
ALT: 17 U/L (ref 0–44)
AST: 26 U/L (ref 15–41)
Albumin: 4.4 g/dL (ref 3.5–5.0)
Alkaline Phosphatase: 87 U/L (ref 38–126)
Anion gap: 13 (ref 5–15)
BUN: 12 mg/dL (ref 6–20)
CO2: 27 mmol/L (ref 22–32)
Calcium: 10.8 mg/dL — ABNORMAL HIGH (ref 8.9–10.3)
Chloride: 102 mmol/L (ref 98–111)
Creatinine, Ser: 0.98 mg/dL (ref 0.44–1.00)
GFR calc Af Amer: >60 mL/min (ref >60)
GFR calc non Af Amer: >60 mL/min (ref >60)
Glucose, Bld: 98 mg/dL (ref 70–99)
Potassium: 3.6 mmol/L (ref 3.5–5.1)
Sodium: 142 mmol/L (ref 135–145)
Total Bilirubin: 0.6 mg/dL (ref 0.3–1.2)
Total Protein: 7.8 g/dL (ref 6.5–8.1)

## 2018-11-28 LAB — CBC WITH DIFFERENTIAL/PLATELET
Abs Immature Granulocytes: 0.01 10*3/uL (ref 0.00–0.07)
Basophils Absolute: 0 10*3/uL (ref 0.0–0.1)
Basophils Relative: 0 %
Eosinophils Absolute: 0.3 10*3/uL (ref 0.0–0.5)
Eosinophils Relative: 5 %
HCT: 36.3 % (ref 36.0–46.0)
Hemoglobin: 12 g/dL (ref 12.0–15.0)
Immature Granulocytes: 0 %
Lymphocytes Relative: 52 %
Lymphs Abs: 3 10*3/uL (ref 0.7–4.0)
MCH: 29.4 pg (ref 26.0–34.0)
MCHC: 33.1 g/dL (ref 30.0–36.0)
MCV: 89 fL (ref 80.0–100.0)
Monocytes Absolute: 0.4 10*3/uL (ref 0.1–1.0)
Monocytes Relative: 7 %
Neutro Abs: 2.2 10*3/uL (ref 1.7–7.7)
Neutrophils Relative %: 36 %
Platelets: 279 10*3/uL (ref 150–400)
RBC: 4.08 MIL/uL (ref 3.87–5.11)
RDW: 12.1 % (ref 11.5–15.5)
WBC: 5.9 10*3/uL (ref 4.0–10.5)
nRBC: 0 % (ref 0.0–0.2)

## 2018-11-28 LAB — IRON AND TIBC
Iron: 66 ug/dL (ref 41–142)
Saturation Ratios: 24 % (ref 21–57)
TIBC: 275 ug/dL (ref 236–444)
UIBC: 209 ug/dL (ref 120–384)

## 2018-11-28 LAB — FERRITIN: Ferritin: 255 ng/mL (ref 11–307)

## 2018-11-28 MED ORDER — GOSERELIN ACETATE 3.6 MG ~~LOC~~ IMPL
3.6000 mg | DRUG_IMPLANT | Freq: Once | SUBCUTANEOUS | Status: AC
  Administered 2018-11-28: 3.6 mg via SUBCUTANEOUS

## 2018-11-28 MED ORDER — GOSERELIN ACETATE 3.6 MG ~~LOC~~ IMPL
DRUG_IMPLANT | SUBCUTANEOUS | Status: AC
  Filled 2018-11-28: qty 3.6

## 2018-11-28 NOTE — Telephone Encounter (Signed)
Scheduled per 08/07 los, patient only wanted me to schedule up to two months worth of injections. Patient did not want avs or calender printed.

## 2018-11-28 NOTE — Telephone Encounter (Signed)
Scheduling injection appointments up to two months is notified and approved by provider.

## 2018-11-29 ENCOUNTER — Encounter: Payer: Self-pay | Admitting: Hematology

## 2018-12-27 ENCOUNTER — Other Ambulatory Visit: Payer: Self-pay

## 2018-12-27 ENCOUNTER — Inpatient Hospital Stay: Payer: No Typology Code available for payment source | Attending: Hematology

## 2018-12-27 VITALS — BP 135/97 | HR 89 | Temp 98.9°F | Resp 18

## 2018-12-27 DIAGNOSIS — Z5111 Encounter for antineoplastic chemotherapy: Secondary | ICD-10-CM | POA: Insufficient documentation

## 2018-12-27 DIAGNOSIS — C50511 Malignant neoplasm of lower-outer quadrant of right female breast: Secondary | ICD-10-CM | POA: Diagnosis present

## 2018-12-27 DIAGNOSIS — D500 Iron deficiency anemia secondary to blood loss (chronic): Secondary | ICD-10-CM

## 2018-12-27 MED ORDER — GOSERELIN ACETATE 3.6 MG ~~LOC~~ IMPL
3.6000 mg | DRUG_IMPLANT | Freq: Once | SUBCUTANEOUS | Status: AC
  Administered 2018-12-27: 3.6 mg via SUBCUTANEOUS

## 2018-12-27 NOTE — Patient Instructions (Signed)

## 2019-01-31 ENCOUNTER — Inpatient Hospital Stay: Payer: No Typology Code available for payment source | Attending: Hematology

## 2019-01-31 ENCOUNTER — Other Ambulatory Visit: Payer: Self-pay

## 2019-01-31 VITALS — BP 116/85 | HR 80 | Temp 98.9°F | Resp 16

## 2019-01-31 DIAGNOSIS — C50511 Malignant neoplasm of lower-outer quadrant of right female breast: Secondary | ICD-10-CM | POA: Insufficient documentation

## 2019-01-31 DIAGNOSIS — Z5111 Encounter for antineoplastic chemotherapy: Secondary | ICD-10-CM | POA: Diagnosis present

## 2019-01-31 DIAGNOSIS — D500 Iron deficiency anemia secondary to blood loss (chronic): Secondary | ICD-10-CM

## 2019-01-31 MED ORDER — GOSERELIN ACETATE 3.6 MG ~~LOC~~ IMPL
3.6000 mg | DRUG_IMPLANT | Freq: Once | SUBCUTANEOUS | Status: AC
  Administered 2019-01-31: 3.6 mg via SUBCUTANEOUS

## 2019-02-02 ENCOUNTER — Telehealth: Payer: Self-pay | Admitting: Hematology

## 2019-02-02 NOTE — Telephone Encounter (Signed)
Returned patient's phone call regarding scheduling monthly injections, per patient's request and 08/07 los appointments have been scheduled.

## 2019-02-28 ENCOUNTER — Inpatient Hospital Stay: Payer: 59 | Attending: Hematology

## 2019-02-28 VITALS — BP 140/98 | HR 75 | Temp 98.7°F | Resp 18

## 2019-02-28 DIAGNOSIS — Z5111 Encounter for antineoplastic chemotherapy: Secondary | ICD-10-CM | POA: Insufficient documentation

## 2019-02-28 DIAGNOSIS — C50511 Malignant neoplasm of lower-outer quadrant of right female breast: Secondary | ICD-10-CM | POA: Diagnosis present

## 2019-02-28 DIAGNOSIS — D500 Iron deficiency anemia secondary to blood loss (chronic): Secondary | ICD-10-CM

## 2019-02-28 MED ORDER — GOSERELIN ACETATE 3.6 MG ~~LOC~~ IMPL
3.6000 mg | DRUG_IMPLANT | Freq: Once | SUBCUTANEOUS | Status: AC
  Administered 2019-02-28: 3.6 mg via SUBCUTANEOUS

## 2019-02-28 NOTE — Patient Instructions (Signed)

## 2019-03-28 ENCOUNTER — Other Ambulatory Visit: Payer: Self-pay

## 2019-03-28 ENCOUNTER — Inpatient Hospital Stay: Payer: 59 | Attending: Hematology

## 2019-03-28 VITALS — BP 136/98 | HR 85 | Temp 98.1°F | Resp 16

## 2019-03-28 DIAGNOSIS — Z5111 Encounter for antineoplastic chemotherapy: Secondary | ICD-10-CM | POA: Insufficient documentation

## 2019-03-28 DIAGNOSIS — C50511 Malignant neoplasm of lower-outer quadrant of right female breast: Secondary | ICD-10-CM | POA: Insufficient documentation

## 2019-03-28 DIAGNOSIS — D500 Iron deficiency anemia secondary to blood loss (chronic): Secondary | ICD-10-CM

## 2019-03-28 MED ORDER — GOSERELIN ACETATE 3.6 MG ~~LOC~~ IMPL
3.6000 mg | DRUG_IMPLANT | Freq: Once | SUBCUTANEOUS | Status: AC
  Administered 2019-03-28: 3.6 mg via SUBCUTANEOUS

## 2019-04-22 ENCOUNTER — Other Ambulatory Visit: Payer: Self-pay

## 2019-04-22 DIAGNOSIS — D500 Iron deficiency anemia secondary to blood loss (chronic): Secondary | ICD-10-CM

## 2019-04-22 MED ORDER — LETROZOLE 2.5 MG PO TABS: 2.5000 mg | ORAL_TABLET | Freq: Every day | ORAL | 3 refills | Status: DC

## 2019-04-25 ENCOUNTER — Other Ambulatory Visit: Payer: Self-pay

## 2019-04-25 ENCOUNTER — Inpatient Hospital Stay: Payer: No Typology Code available for payment source | Attending: Hematology

## 2019-04-25 VITALS — BP 112/86 | HR 85 | Temp 98.2°F | Resp 18

## 2019-04-25 DIAGNOSIS — C50511 Malignant neoplasm of lower-outer quadrant of right female breast: Secondary | ICD-10-CM | POA: Insufficient documentation

## 2019-04-25 DIAGNOSIS — Z5111 Encounter for antineoplastic chemotherapy: Secondary | ICD-10-CM | POA: Insufficient documentation

## 2019-04-25 DIAGNOSIS — D500 Iron deficiency anemia secondary to blood loss (chronic): Secondary | ICD-10-CM

## 2019-04-25 MED ORDER — GOSERELIN ACETATE 3.6 MG ~~LOC~~ IMPL
3.6000 mg | DRUG_IMPLANT | Freq: Once | SUBCUTANEOUS | Status: AC
  Administered 2019-04-25: 3.6 mg via SUBCUTANEOUS

## 2019-04-25 NOTE — Patient Instructions (Signed)

## 2019-05-23 ENCOUNTER — Inpatient Hospital Stay: Payer: No Typology Code available for payment source

## 2019-05-23 ENCOUNTER — Other Ambulatory Visit: Payer: Self-pay

## 2019-05-23 VITALS — BP 134/89 | HR 91 | Temp 98.1°F | Resp 18

## 2019-05-23 DIAGNOSIS — D500 Iron deficiency anemia secondary to blood loss (chronic): Secondary | ICD-10-CM

## 2019-05-23 DIAGNOSIS — Z5111 Encounter for antineoplastic chemotherapy: Secondary | ICD-10-CM | POA: Diagnosis not present

## 2019-05-23 MED ORDER — GOSERELIN ACETATE 3.6 MG ~~LOC~~ IMPL
3.6000 mg | DRUG_IMPLANT | Freq: Once | SUBCUTANEOUS | Status: AC
  Administered 2019-05-23: 3.6 mg via SUBCUTANEOUS

## 2019-05-23 NOTE — Patient Instructions (Signed)
Goserelin injection What is this medicine? GOSERELIN (GOE se rel in) is similar to a hormone found in the body. It lowers the amount of sex hormones that the body makes. Men will have lower testosterone levels and women will have lower estrogen levels while taking this medicine. In men, this medicine is used to treat prostate cancer; the injection is either given once per month or once every 12 weeks. A once per month injection (only) is used to treat women with endometriosis, dysfunctional uterine bleeding, or advanced breast cancer. This medicine may be used for other purposes; ask your health care provider or pharmacist if you have questions. COMMON BRAND NAME(S): Zoladex What should I tell my health care provider before I take this medicine? They need to know if you have any of these conditions:  bone problems  diabetes  heart disease  history of irregular heartbeat  an unusual or allergic reaction to goserelin, other medicines, foods, dyes, or preservatives  pregnant or trying to get pregnant  breast-feeding How should I use this medicine? This medicine is for injection under the skin. It is given by a health care professional in a hospital or clinic setting. Talk to your pediatrician regarding the use of this medicine in children. Special care may be needed. Overdosage: If you think you have taken too much of this medicine contact a poison control center or emergency room at once. NOTE: This medicine is only for you. Do not share this medicine with others. What if I miss a dose? It is important not to miss your dose. Call your doctor or health care professional if you are unable to keep an appointment. What may interact with this medicine? Do not take this medicine with any of the following medications:  cisapride  dronedarone  pimozide  thioridazine This medicine may also interact with the following medications:  other medicines that prolong the QT interval (an abnormal  heart rhythm) This list may not describe all possible interactions. Give your health care provider a list of all the medicines, herbs, non-prescription drugs, or dietary supplements you use. Also tell them if you smoke, drink alcohol, or use illegal drugs. Some items may interact with your medicine. What should I watch for while using this medicine? Visit your doctor or health care provider for regular checks on your progress. Your symptoms may appear to get worse during the first weeks of this therapy. Tell your doctor or healthcare provider if your symptoms do not start to get better or if they get worse after this time. Your bones may get weaker if you take this medicine for a long time. If you smoke or frequently drink alcohol you may increase your risk of bone loss. A family history of osteoporosis, chronic use of drugs for seizures (convulsions), or corticosteroids can also increase your risk of bone loss. Talk to your doctor about how to keep your bones strong. This medicine should stop regular monthly menstruation in women. Tell your doctor if you continue to menstruate. Women should not become pregnant while taking this medicine or for 12 weeks after stopping this medicine. Women should inform their doctor if they wish to become pregnant or think they might be pregnant. There is a potential for serious side effects to an unborn child. Talk to your health care professional or pharmacist for more information. Do not breast-feed an infant while taking this medicine. Men should inform their doctors if they wish to father a child. This medicine may lower sperm counts. Talk   to your health care professional or pharmacist for more information. This medicine may increase blood sugar. Ask your healthcare provider if changes in diet or medicines are needed if you have diabetes. What side effects may I notice from receiving this medicine? Side effects that you should report to your doctor or health care  professional as soon as possible:  allergic reactions like skin rash, itching or hives, swelling of the face, lips, or tongue  bone pain  breathing problems  changes in vision  chest pain  feeling faint or lightheaded, falls  fever, chills  pain, swelling, warmth in the leg  pain, tingling, numbness in the hands or feet  signs and symptoms of high blood sugar such as being more thirsty or hungry or having to urinate more than normal. You may also feel very tired or have blurry vision  signs and symptoms of low blood pressure like dizziness; feeling faint or lightheaded, falls; unusually weak or tired  stomach pain  swelling of the ankles, feet, hands  trouble passing urine or change in the amount of urine  unusually high or low blood pressure  unusually weak or tired Side effects that usually do not require medical attention (report to your doctor or health care professional if they continue or are bothersome):  change in sex drive or performance  changes in breast size in both males and females  changes in emotions or moods  headache  hot flashes  irritation at site where injected  loss of appetite  skin problems like acne, dry skin  vaginal dryness This list may not describe all possible side effects. Call your doctor for medical advice about side effects. You may report side effects to FDA at 1-800-FDA-1088. Where should I keep my medicine? This drug is given in a hospital or clinic and will not be stored at home. NOTE: This sheet is a summary. It may not cover all possible information. If you have questions about this medicine, talk to your doctor, pharmacist, or health care provider.  2020 Elsevier/Gold Standard (2018-07-28 14:05:56)  Coronavirus (COVID-19) Are you at risk?  Are you at risk for the Coronavirus (COVID-19)?  To be considered HIGH RISK for Coronavirus (COVID-19), you have to meet the following criteria:  . Traveled to [place], [place],  [place], [place] or [place]; or in the [place] to [place], [place], [place], or [place]; and have fever, cough, and shortness of breath within the last 2 weeks of travel OR . Been in close contact with a person diagnosed with COVID-19 within the last 2 weeks and have fever, cough, and shortness of breath . IF YOU DO NOT MEET THESE CRITERIA, YOU ARE CONSIDERED LOW RISK FOR COVID-19.  What to do if you are HIGH RISK for COVID-19?  Marland Kitchen If you are having a medical emergency, call 911. . Seek medical care right away. Before you go to a doctor's office, urgent care or emergency department, call ahead and tell them about your recent travel, contact with someone diagnosed with COVID-19, and your symptoms. You should receive instructions from your physician's office regarding next steps of care.  . When you arrive at healthcare provider, tell the healthcare staff immediately you have returned from visiting [place], [place], [place], [place] or [place]; or traveled in the [place] to [place], [place], [place], or [place]; in the last two weeks or you have been in close contact with a person diagnosed with COVID-19 in the last 2 weeks.   Marland Kitchen  Tell the health care staff about your symptoms: fever, cough and shortness of breath. . After you have been seen by a medical provider, you will be either: o Tested for (COVID-19) and discharged home on quarantine except to seek medical care if symptoms worsen, and asked to  - Stay home and avoid contact with others until you get your results (4-5 days)  - Avoid travel on public transportation if possible (such as bus, train, or airplane) or o Sent to the Emergency Department by EMS for evaluation, COVID-19 testing, and possible admission depending on your condition and test results.  What to do if you are LOW RISK for COVID-19?  Reduce your risk of any infection by using the same precautions used for avoiding the common cold or flu:  Marland Kitchen Wash your  hands often with soap and warm water for at least 20 seconds.  If soap and water are not readily available, use an alcohol-based hand sanitizer with at least 60% alcohol.  . If coughing or sneezing, cover your mouth and nose by coughing or sneezing into the elbow areas of your shirt or coat, into a tissue or into your sleeve (not your hands). . Avoid shaking hands with others and consider head nods or verbal greetings only. . Avoid touching your eyes, nose, or mouth with unwashed hands.  . Avoid close contact with people who are sick. . Avoid places or events with large numbers of people in one location, like concerts or sporting events. . Carefully consider travel plans you have or are making. . If you are planning any travel outside or inside the [place], visit the CDC's Travelers' Health webpage for the latest health notices. . If you have some symptoms but not all symptoms, continue to monitor at home and seek medical attention if your symptoms worsen. . If you are having a medical emergency, call 911.   [place] / e-Visit: [email or web address]         [facility]: [facility]: [id number]                   [facility]: [phone]

## 2019-05-29 NOTE — Progress Notes (Signed)
[facility]   [phone] [phone]) [phone]   Clinic Follow up Note   Patient Care Team: Lucianne Lei, MD as PCP - General (Family Medicine) Excell Seltzer, MD (Inactive) as Consulting Physician (General Surgery) Truitt Merle, MD as Consulting Physician (Hematology) Gardenia Phlegm, NP as Nurse Practitioner (Hematology and Oncology) [name], MD as Consulting Physician (Radiation Oncology)  Date of Service:  06/05/2019  CHIEF COMPLAINT: F/u on right breast cancer  SUMMARY OF ONCOLOGIC HISTORY: Oncology History Overview Note  Breast cancer of lower-outer quadrant of right female breast [facility])   Staging form: Breast, AJCC 7th Edition     Clinical stage from 08/03/2015: Stage IA (T1c, N0, M0) - Unsigned     Pathologic stage from 09/29/2015: Stage IIIA (T3(m), N1a, cM0) - Signed by Truitt Merle, MD on 10/17/2015       Breast cancer of lower-outer quadrant of right female breast ([place])  07/25/2015 Mammogram   diagnostic mammogram and ultrasound showed a 11 mm mass in the 8:00 position of the right breast, small irregular lesion in the retroareolar regio of the right breast, contiguous with the dermis   07/26/2015 Initial Diagnosis   Breast cancer of lower-outer quadrant of right female breast ([place])   07/26/2015 Initial Biopsy   right breast needle core biopsy at 8:00 position (1), invasive ductal carcinoma, grade 3, 6:30 o'clock position invasive (2) and in situ ductal carcinoma, grade 1-2, lymphovascular invasion is identified   07/26/2015 Receptors her2   (1) ER 95% positive, PR 95% positive, Ki-67 30%, HER-2 positive with ratio 2.04, copy # 5.20, (2) ER 90% positive, PR 95% positive, Ki-67 10%, HER-2 negative.   08/02/2015 Imaging   breast MRI showed extensive mass  and non-mass enhancement in the lower outer quadrant of the right breast  spanning 13 cm, focal enhancement within the right nipple, at least 3-4 abnormal appearing right axillary lymph node.    09/29/2015 Surgery   right mastectomy and axillar node dissection    09/29/2015 Pathology Results   Right mastectomy and axillary node dissection showed multifocal mammary carcinoma with ductal and lobular features, tumor size 13 cm, 2.1 cm, 1.9 cm, and 0.5 cm, grade 2-3, anterior margins were positive, 2 nodes with metastasis and one with isolated cells   11/01/2015 - 02/14/2016 Chemotherapy   TCHP every 3 weeks, for 6 cycles, followed by herceptin maintenance therapy for a total of one year    03/06/2016 - 11/01/2016 Antibody Plan   Maintenance Herceptin and perjeta, every 3 weeks   03/07/2016 - 04/27/2016 Radiation Therapy   03/07/16-04/27/16 Site/dose: 1) 61fd right chest wall/ 45 Gy in 25 fractions 2) Right mastectomy scar / 16 Gy in 8 fractions   05/21/2016 -  Anti-estrogen oral therapy   Tamoxifen 20 mg daily starting 05/21/16 Switched to Letrozole 2.'5mg'$  daily on 01/17/18.  Started monthyl Zolodex injections on 12/16/17   10/17/2016 Mammogram   IMPRESSION: No mammographic evidence of malignancy. A result letter of this screening mammogram will be mailed directly to the patient.   10/22/2016 Echocardiogram   Impressions:  - Normal study. Stable GLPSS at -21%. LVEF 60-65%.   11/09/2016 - 11/09/2017 Anti-estrogen oral therapy    Neratinib '240mg'$  daily started on 11/09/2016. Completed in one year   02/04/2017 Survivorship   Survivorship Clinic with CGardenia Phlegm NP   03/11/2017 Surgery   REMOVAL OF RIGHT TISSUE EXPANDERS WITH PLACEMRIGHT SILICONE  BREAST IMPLANTS and left MASTOPEXY FOR SYMENTRY by Dr. DMarla Roe 03/11/17   03/11/2017  Pathology Results   Diagnosis 03/11/17 1. Breast, capsule, right inferior BENIGN FIBROVASCULAR TISSUE 2. Breast, Mammoplasty, left BENIGN BREAST TISSUE NEGATIVE FOR ATYPIA OR MALIGNANCY      CURRENT THERAPY:  Letrozole2.'5mg'$  dailystartedon 9/27//2019 Zoladexmonthly injection started on December 16, 2017  INTERVAL HISTORY:   Melanie Moss is here for a follow up of right breast cancer. She presents to the clinic alone.  She is clinically doing well overall.  She has manageable hot flash and a mild arthralgia, which is tolerable.  She also noticed mild memory loss in the past few months and she has to write down things to remind herself, but no serious consequences or accidents, she is functioning well.  She has good appetite and energy level, weight is stable, review of system otherwise negative.  MEDICAL HISTORY:  Past Medical History:  Diagnosis Date  . Anemia   . Anxiety   . Breast cancer of lower-outer quadrant of right female breast ([place]) 07/29/2015  . Family history of breast cancer   . Family history of colon cancer   . Family history of prostate cancer   . GERD (gastroesophageal reflux disease)    tums   . History of radiation therapy 03/07/16-04/27/16   right chest wall 45 Gy in 25 fractions, right mastectomy scar 16 Gy in 8 fractions  . Hyperlipidemia   . Hypertension   . Lymphedema   . Personal history of chemotherapy   . Personal history of radiation therapy   . Preterm labor     SURGICAL HISTORY: Past Surgical History:  Procedure Laterality Date  . AXILLARY LYMPH NODE DISSECTION Right 09/29/2015   Procedure: AXILLARY LYMPH NODE DISSECTION;  Surgeon: Excell Seltzer, MD;  Location: [place];  Service: General;  Laterality: Right;  . BREAST IMPLANT REMOVAL Right 06/01/2017   Procedure: EXPLANT OF RIGHT 480 MENTOR BREAST IMPLANT;  Surgeon: Wallace Going, DO;  Location: [place];  Service: Plastics;  Laterality: Right;  . BREAST RECONSTRUCTION Right 08/08/2017   Procedure: RIGHT BREAST RECONSTRUCTION WITH LATISSMA MYOCUTANEOUS FLAP AND EXPANDER PLACEMENT;  Surgeon: Wallace Going, DO;  Location: [place];  Service: Plastics;  Laterality: Right;  . BREAST RECONSTRUCTION WITH PLACEMENT OF TISSUE EXPANDER AND FLEX HD (ACELLULAR HYDRATED DERMIS) Right 09/29/2015   Procedure: BREAST RECONSTRUCTION WITH  PLACEMENT OF TISSUE EXPANDER AND FLEX HD (ACELLULAR HYDRATED DERMIS);  Surgeon: Loel Lofty Dillingham, DO;  Location: [place];  Service: Plastics;  Laterality: Right;  . CERVICAL CERCLAGE  02/19/2011   Procedure: CERCLAGE CERVICAL;  Surgeon: Agnes Lawrence, MD;  Location: [facility];  Service: Gynecology;  Laterality: N/A;  . DILATION AND CURETTAGE OF UTERUS    . INCISION AND DRAINAGE OF WOUND Right 04/19/2017   Procedure: IRRIGATION OF RIGHT BREAST POCKET;  Surgeon: Wallace Going, DO;  Location: [place];  Service: Plastics;  Laterality: Right;  . LATISSIMUS FLAP TO BREAST Right 08/08/2017   Procedure: LATISSIMUS FLAP TO BREAST;  Surgeon: Wallace Going, DO;  Location: [place];  Service: Plastics;  Laterality: Right;  . MASTECTOMY Right 2017   right 2017  . MASTECTOMY WITH AXILLARY LYMPH NODE DISSECTION Right 09/29/2015  . MASTOPEXY Left 03/11/2017   Procedure: MASTOPEXY FOR SYMENTRY;  Surgeon: Wallace Going, DO;  Location: [place];  Service: Plastics;  Laterality: Left;  . PORT-A-CATH REMOVAL  10/2016  . PORTACATH PLACEMENT N/A 09/29/2015   Procedure: INSERTION PORT-A-CATH;  Surgeon: Excell Seltzer, MD;  Location: [place];  Service: General;  Laterality:  N/A;  . REMOVAL OF BILATERAL TISSUE EXPANDERS WITH PLACEMENT OF BILATERAL BREAST IMPLANTS Right 03/11/2017   Procedure: REMOVAL OF RIGHT TISSUE EXPANDERS WITH PLACEMRIGHT SILICONE  BREAST IMPLANTS;  Surgeon: Wallace Going, DO;  Location: [place];  Service: Plastics;  Laterality: Right;  . REMOVAL OF TISSUE EXPANDER AND PLACEMENT OF IMPLANT Right 10/19/2017   Procedure: REMOVAL OF TISSUE EXPANDER;  Surgeon: Wallace Going, DO;  Location: [place];  Service: Plastics;  Laterality: Right;  . SIMPLE MASTECTOMY WITH AXILLARY SENTINEL NODE BIOPSY Right 09/29/2015   Procedure: RIGHT TOTAL  MASTECTOMY WITH AXILLARY SENTINEL NODE BIOPSY;  Surgeon: Excell Seltzer, MD;   Location: [place];  Service: General;  Laterality: Right;    I have reviewed the social history and family history with the patient and they are unchanged from previous note.  ALLERGIES:  has No Known Allergies.  MEDICATIONS:  Current Outpatient Medications  Medication Sig Dispense Refill  . B Complex-C (SUPER B COMPLEX PO) Take 1 tablet by mouth daily.    Marland Kitchen ezetimibe (ZETIA) 10 MG tablet Take 10 mg by mouth at bedtime.    . ferrous sulfate 325 (65 FE) MG tablet ferrous sulfate 325 mg (65 mg iron) tablet  Take 1 tablet twice a day by oral route with meals for 30 days.  You may take colace over the counter as stool softener if you get constipation from the iron tablets.    . irbesartan-hydrochlorothiazide (AVALIDE) 150-12.5 MG tablet Take 1 tablet by mouth daily.    . Iron-FA-B Cmp-C-Biot-Probiotic (FUSION PLUS PO) Take 1 tablet by mouth daily.    Marland Kitchen letrozole (FEMARA) 2.5 MG tablet Take 1 tablet (2.5 mg total) by mouth daily. 90 tablet 3   No current facility-administered medications for this visit.   Facility-Administered Medications Ordered in Other Visits  Medication Dose Route Frequency Provider Last Rate Last Admin  . 0.9 %  sodium chloride infusion   Intravenous Once Truitt Merle, MD      . alteplase (CATHFLO ACTIVASE) injection 2 mg  2 mg Intracatheter Once PRN Truitt Merle, MD      . heparin lock flush 100 unit/mL  250 Units Intracatheter Once PRN Truitt Merle, MD      . heparin lock flush 100 unit/mL  500 Units Intravenous Once PRN Truitt Merle, MD      . sodium chloride 0.9 % injection 10 mL  10 mL Intracatheter PRN Truitt Merle, MD      . sodium chloride 0.9 % injection 10 mL  10 mL Intravenous PRN Truitt Merle, MD      . sodium chloride 0.9 % injection 3 mL  3 mL Intravenous Once PRN Truitt Merle, MD      . sodium chloride flush (NS) 0.9 % injection 10 mL  10 mL Intravenous PRN Truitt Merle, MD        PHYSICAL EXAMINATION: ECOG PERFORMANCE STATUS: 0 - Asymptomatic  Vitals:   06/05/19 0840  BP:  133/90  Pulse: 98  Resp: 18  Temp: 98 F (36.7 C)  SpO2: 97%   Filed Weights   06/05/19 0840  Weight: 155 lb (70.3 kg)    GENERAL:alert, no distress and comfortable SKIN: skin color, texture, turgor are normal, no rashes or significant lesions EYES: normal, Conjunctiva are pink and non-injected, sclera clear NECK: supple, thyroid normal size, non-tender, without nodularity LYMPH:  no palpable lymphadenopathy in the cervical, axillary  LUNGS: clear to auscultation and percussion with normal breathing effort HEART: regular rate &  rhythm and no murmurs and no lower extremity edema ABDOMEN:abdomen soft, non-tender and normal bowel sounds Musculoskeletal:no cyanosis of digits and no clubbing  NEURO: alert & oriented x 3 with fluent speech, no focal motor/sensory deficits  LABORATORY DATA:  I have reviewed the data as listed CBC Latest Ref Rng & Units 06/05/2019 11/28/2018 04/17/2018  WBC 4.0 - 10.5 K/uL 6.8 5.9 5.6  Hemoglobin 12.0 - 15.0 g/dL 12.6 12.0 11.7(L)  Hematocrit 36.0 - 46.0 % 37.9 36.3 36.1  Platelets 150 - 400 K/uL 333 279 212     CMP Latest Ref Rng & Units 06/05/2019 11/28/2018 04/17/2018  Glucose 70 - 99 mg/dL 112(H) 98 113(H)  BUN 6 - 20 mg/dL '15 12 13  '$ Creatinine 0.44 - 1.00 mg/dL 0.96 0.98 0.98  Sodium 135 - 145 mmol/L 142 142 143  Potassium 3.5 - 5.1 mmol/L 3.5 3.6 4.0  Chloride 98 - 111 mmol/L 103 102 104  CO2 22 - 32 mmol/L 28 27 32  Calcium 8.9 - 10.3 mg/dL 10.4(H) 10.8(H) 10.0  Total Protein 6.5 - 8.1 g/dL 8.2(H) 7.8 7.4  Total Bilirubin 0.3 - 1.2 mg/dL 0.4 0.6 0.4  Alkaline Phos 38 - 126 U/L 78 87 54  AST 15 - 41 U/L '24 26 23  '$ ALT 0 - 44 U/L '15 17 16      '$ RADIOGRAPHIC STUDIES: I have personally reviewed the radiological images as listed and agreed with the findings in the report. No results found.   ASSESSMENT & PLAN:  KAYSI OURADA is a 44 y.o. female with    1. 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 Treated with right mastectomy, radiation,adjuvant chemo and HER2 antibodies. -She started antiestrogen therapy withTamoxifenin 04/2016 andwas switched to Letrozole on 9/27/2019with ovarian suppression. She currently receivesZoladex injectionsmonthly since 11/2017.She is tolerating moderately well with joint pain but hot flashes now significant.  -Due to COVID-19 her BSO and breast reconstruction was post-poned. Once she completes BSO she will stop Zoladex injections.  -she plans to have BSO surgery done in the next month -She is clinically doing well, tolerating letrozole well with manageable hot flash and arthralgia -She has noticed mild memory loss late, could be still related to her previous chemotherapy -Breast cancer surveillance, next mammogram in July  -She plans to have right breast [facility] in the near future   2. Iron deficient anemia, and anemia of chronic disease -resolved   3. HTN -f/u with PCP   4. Right arm lymphedema  -Currently uses compression cast on her right arm, and a pump from PT. -stable  5. Hot flashes, joint stiffness and pain -Secondary to AI -Joint pain is tolerable.  -She has tried Gabapentin and Celexa but they made her too drowsy and Effexor made her loopy, so she stopped.  -overall better, manageable    Plan -She is clinically doing well -Continue Letrozole  -Lab and f/u in 6 months -MonthlyZoladexinjection until BSO, next in 2 weeks -she plan to have BSO and right breast reconstruction in near future    No problem-specific Assessment & Plan notes found for this encounter.   Orders Placed This Encounter  Procedures  . MM DIAG BREAST TOMO UNI LEFT    Standing Status:   Future    Standing Expiration Date:   06/04/2020    Order Specific Question:    Reason  for Exam (SYMPTOM  OR DIAGNOSIS REQUIRED)    Answer:   screening    Order Specific Question:   Is the patient pregnant?    Answer:   No    Order Specific Question:   Preferred imaging location?    Answer:   [facility]   All questions were answered. The patient knows to call the clinic with any problems, questions or concerns. No barriers to learning was detected. The total time spent in the appointment was 30 minutes.     Truitt Merle, MD 06/05/2019   I, Joslyn Devon, am acting as scribe for Truitt Merle, MD.   I have reviewed the above documentation for accuracy and completeness, and I agree with the above.

## 2019-06-05 ENCOUNTER — Inpatient Hospital Stay (HOSPITAL_BASED_OUTPATIENT_CLINIC_OR_DEPARTMENT_OTHER): Payer: 59 | Admitting: Hematology

## 2019-06-05 ENCOUNTER — Other Ambulatory Visit: Payer: Self-pay

## 2019-06-05 ENCOUNTER — Encounter: Payer: Self-pay | Admitting: Hematology

## 2019-06-05 ENCOUNTER — Inpatient Hospital Stay: Payer: 59 | Attending: Hematology

## 2019-06-05 VITALS — BP 133/90 | HR 98 | Temp 98.0°F | Resp 18 | Ht 63.0 in | Wt 155.0 lb

## 2019-06-05 DIAGNOSIS — I10 Essential (primary) hypertension: Secondary | ICD-10-CM | POA: Diagnosis not present

## 2019-06-05 DIAGNOSIS — N951 Menopausal and female climacteric states: Secondary | ICD-10-CM | POA: Insufficient documentation

## 2019-06-05 DIAGNOSIS — D500 Iron deficiency anemia secondary to blood loss (chronic): Secondary | ICD-10-CM

## 2019-06-05 DIAGNOSIS — C50511 Malignant neoplasm of lower-outer quadrant of right female breast: Secondary | ICD-10-CM | POA: Diagnosis present

## 2019-06-05 DIAGNOSIS — Z79811 Long term (current) use of aromatase inhibitors: Secondary | ICD-10-CM | POA: Diagnosis not present

## 2019-06-05 DIAGNOSIS — Z170 Estrogen receptor positive status [ER+]: Secondary | ICD-10-CM

## 2019-06-05 DIAGNOSIS — D638 Anemia in other chronic diseases classified elsewhere: Secondary | ICD-10-CM | POA: Diagnosis not present

## 2019-06-05 DIAGNOSIS — Z5111 Encounter for antineoplastic chemotherapy: Secondary | ICD-10-CM | POA: Insufficient documentation

## 2019-06-05 LAB — CBC WITH DIFFERENTIAL/PLATELET
Abs Immature Granulocytes: 0.01 10*3/uL (ref 0.00–0.07)
Basophils Absolute: 0 10*3/uL (ref 0.0–0.1)
Basophils Relative: 1 %
Eosinophils Absolute: 0.1 10*3/uL (ref 0.0–0.5)
Eosinophils Relative: 2 %
HCT: 37.9 % (ref 36.0–46.0)
Hemoglobin: 12.6 g/dL (ref 12.0–15.0)
Immature Granulocytes: 0 %
Lymphocytes Relative: 50 %
Lymphs Abs: 3.4 10*3/uL (ref 0.7–4.0)
MCH: 29.2 pg (ref 26.0–34.0)
MCHC: 33.2 g/dL (ref 30.0–36.0)
MCV: 87.9 fL (ref 80.0–100.0)
Monocytes Absolute: 0.4 10*3/uL (ref 0.1–1.0)
Monocytes Relative: 6 %
Neutro Abs: 2.8 10*3/uL (ref 1.7–7.7)
Neutrophils Relative %: 41 %
Platelets: 333 10*3/uL (ref 150–400)
RBC: 4.31 MIL/uL (ref 3.87–5.11)
RDW: 12.6 % (ref 11.5–15.5)
WBC: 6.8 10*3/uL (ref 4.0–10.5)
nRBC: 0 % (ref 0.0–0.2)

## 2019-06-05 LAB — COMPREHENSIVE METABOLIC PANEL
ALT: 15 U/L (ref 0–44)
AST: 24 U/L (ref 15–41)
Albumin: 4.6 g/dL (ref 3.5–5.0)
Alkaline Phosphatase: 78 U/L (ref 38–126)
Anion gap: 11 (ref 5–15)
BUN: 15 mg/dL (ref 6–20)
CO2: 28 mmol/L (ref 22–32)
Calcium: 10.4 mg/dL — ABNORMAL HIGH (ref 8.9–10.3)
Chloride: 103 mmol/L (ref 98–111)
Creatinine, Ser: 0.96 mg/dL (ref 0.44–1.00)
GFR calc Af Amer: >60 mL/min (ref >60)
GFR calc non Af Amer: >60 mL/min (ref >60)
Glucose, Bld: 112 mg/dL — ABNORMAL HIGH (ref 70–99)
Potassium: 3.5 mmol/L (ref 3.5–5.1)
Sodium: 142 mmol/L (ref 135–145)
Total Bilirubin: 0.4 mg/dL (ref 0.3–1.2)
Total Protein: 8.2 g/dL — ABNORMAL HIGH (ref 6.5–8.1)

## 2019-06-05 LAB — IRON AND TIBC
Iron: 51 ug/dL (ref 41–142)
Saturation Ratios: 17 % — ABNORMAL LOW (ref 21–57)
TIBC: 309 ug/dL (ref 236–444)
UIBC: 257 ug/dL (ref 120–384)

## 2019-06-05 LAB — FERRITIN: Ferritin: 198 ng/mL (ref 11–307)

## 2019-06-08 ENCOUNTER — Telehealth: Payer: Self-pay | Admitting: Hematology

## 2019-06-08 NOTE — Telephone Encounter (Signed)
Scheduled appt per 2/12 los.  Left a VM of the appt date and time.

## 2019-06-20 ENCOUNTER — Other Ambulatory Visit: Payer: Self-pay

## 2019-06-20 ENCOUNTER — Inpatient Hospital Stay: Payer: 59

## 2019-06-20 VITALS — BP 126/96 | HR 84 | Temp 98.3°F | Resp 18

## 2019-06-20 DIAGNOSIS — D500 Iron deficiency anemia secondary to blood loss (chronic): Secondary | ICD-10-CM

## 2019-06-20 DIAGNOSIS — Z5111 Encounter for antineoplastic chemotherapy: Secondary | ICD-10-CM | POA: Diagnosis not present

## 2019-06-20 MED ORDER — GOSERELIN ACETATE 3.6 MG ~~LOC~~ IMPL
3.6000 mg | DRUG_IMPLANT | Freq: Once | SUBCUTANEOUS | Status: AC
  Administered 2019-06-20: 11:00:00 3.6 mg via SUBCUTANEOUS

## 2019-07-15 ENCOUNTER — Telehealth: Payer: Self-pay

## 2019-07-15 NOTE — Telephone Encounter (Signed)
Ms Hendrich left vm requesting this sat appt be moved to Thursday as she is working on Saturday.  She also stated she needs 2 more appt as her surgery has not been scheduled.  She is requesting the name of a new gyn.  Scheduling message sent.

## 2019-07-16 ENCOUNTER — Other Ambulatory Visit: Payer: Self-pay

## 2019-07-16 ENCOUNTER — Inpatient Hospital Stay: Payer: 59 | Attending: Hematology

## 2019-07-16 VITALS — BP 128/78 | HR 78 | Temp 98.2°F | Resp 18

## 2019-07-16 DIAGNOSIS — Z5111 Encounter for antineoplastic chemotherapy: Secondary | ICD-10-CM | POA: Insufficient documentation

## 2019-07-16 DIAGNOSIS — Z9011 Acquired absence of right breast and nipple: Secondary | ICD-10-CM | POA: Insufficient documentation

## 2019-07-16 DIAGNOSIS — Z170 Estrogen receptor positive status [ER+]: Secondary | ICD-10-CM | POA: Insufficient documentation

## 2019-07-16 DIAGNOSIS — C50511 Malignant neoplasm of lower-outer quadrant of right female breast: Secondary | ICD-10-CM | POA: Insufficient documentation

## 2019-07-16 DIAGNOSIS — D500 Iron deficiency anemia secondary to blood loss (chronic): Secondary | ICD-10-CM

## 2019-07-16 MED ORDER — GOSERELIN ACETATE 3.6 MG ~~LOC~~ IMPL
DRUG_IMPLANT | SUBCUTANEOUS | Status: AC
  Filled 2019-07-16: qty 3.6

## 2019-07-16 MED ORDER — GOSERELIN ACETATE 3.6 MG ~~LOC~~ IMPL
3.6000 mg | DRUG_IMPLANT | Freq: Once | SUBCUTANEOUS | Status: AC
  Administered 2019-07-16: 3.6 mg via SUBCUTANEOUS

## 2019-07-16 NOTE — Patient Instructions (Signed)
Goserelin injection What is this medicine? GOSERELIN (GOE se rel in) is similar to a hormone found in the body. It lowers the amount of sex hormones that the body makes. Men will have lower testosterone levels and women will have lower estrogen levels while taking this medicine. In men, this medicine is used to treat prostate cancer; the injection is either given once per month or once every 12 weeks. A once per month injection (only) is used to treat women with endometriosis, dysfunctional uterine bleeding, or advanced breast cancer. This medicine may be used for other purposes; ask your health care provider or pharmacist if you have questions. COMMON BRAND NAME(S): Zoladex What should I tell my health care provider before I take this medicine? They need to know if you have any of these conditions (some only apply to women): -diabetes -heart disease or previous heart attack -high blood pressure -high cholesterol -kidney disease -osteoporosis or low bone density -problems passing urine -spinal cord injury -stroke -tobacco smoker -an unusual or allergic reaction to goserelin, hormone therapy, other medicines, foods, dyes, or preservatives -pregnant or trying to get pregnant -breast-feeding How should I use this medicine? This medicine is for injection under the skin. It is given by a health care professional in a hospital or clinic setting. Men receive this injection once every 4 weeks or once every 12 weeks. Women will only receive the once every 4 weeks injection. Talk to your pediatrician regarding the use of this medicine in children. Special care may be needed. Overdosage: If you think you have taken too much of this medicine contact a poison control center or emergency room at once. NOTE: This medicine is only for you. Do not share this medicine with others. What if I miss a dose? It is important not to miss your dose. Call your doctor or health care professional if you are unable to  keep an appointment. What may interact with this medicine? -female hormones like estrogen -herbal or dietary supplements like black cohosh, chasteberry, or DHEA -female hormones like testosterone -prasterone This list may not describe all possible interactions. Give your health care provider a list of all the medicines, herbs, non-prescription drugs, or dietary supplements you use. Also tell them if you smoke, drink alcohol, or use illegal drugs. Some items may interact with your medicine. What should I watch for while using this medicine? Visit your doctor or health care professional for regular checks on your progress. Your symptoms may appear to get worse during the first weeks of this therapy. Tell your doctor or healthcare professional if your symptoms do not start to get better or if they get worse after this time. Your bones may get weaker if you take this medicine for a long time. If you smoke or frequently drink alcohol you may increase your risk of bone loss. A family history of osteoporosis, chronic use of drugs for seizures (convulsions), or corticosteroids can also increase your risk of bone loss. Talk to your doctor about how to keep your bones strong. This medicine should stop regular monthly menstration in women. Tell your doctor if you continue to menstrate. Women should not become pregnant while taking this medicine or for 12 weeks after stopping this medicine. Women should inform their doctor if they wish to become pregnant or think they might be pregnant. There is a potential for serious side effects to an unborn child. Talk to your health care professional or pharmacist for more information. Do not breast-feed an infant while taking   this medicine. Men should inform their doctors if they wish to father a child. This medicine may lower sperm counts. Talk to your health care professional or pharmacist for more information. What side effects may I notice from receiving this  medicine? Side effects that you should report to your doctor or health care professional as soon as possible: -allergic reactions like skin rash, itching or hives, swelling of the face, lips, or tongue -bone pain -breathing problems -changes in vision -chest pain -feeling faint or lightheaded, falls -fever, chills -pain, swelling, warmth in the leg -pain, tingling, numbness in the hands or feet -signs and symptoms of low blood pressure like dizziness; feeling faint or lightheaded, falls; unusually weak or tired -stomach pain -swelling of the ankles, feet, hands -trouble passing urine or change in the amount of urine -unusually high or low blood pressure -unusually weak or tired Side effects that usually do not require medical attention (report to your doctor or health care professional if they continue or are bothersome): -change in sex drive or performance -changes in breast size in both males and females -changes in emotions or moods -headache -hot flashes -irritation at site where injected -loss of appetite -skin problems like acne, dry skin -vaginal dryness This list may not describe all possible side effects. Call your doctor for medical advice about side effects. You may report side effects to FDA at 1-800-FDA-1088. Where should I keep my medicine? This drug is given in a hospital or clinic and will not be stored at home. NOTE: This sheet is a summary. It may not cover all possible information. If you have questions about this medicine, talk to your doctor, pharmacist, or health care provider.  2019 Elsevier/Gold Standard (2013-06-16 11:10:35)  

## 2019-07-18 ENCOUNTER — Ambulatory Visit: Payer: 59

## 2019-08-15 ENCOUNTER — Other Ambulatory Visit: Payer: Self-pay

## 2019-08-15 ENCOUNTER — Inpatient Hospital Stay: Payer: 59 | Attending: Hematology

## 2019-08-15 VITALS — BP 118/83 | HR 84 | Temp 98.9°F | Resp 16

## 2019-08-15 DIAGNOSIS — C50511 Malignant neoplasm of lower-outer quadrant of right female breast: Secondary | ICD-10-CM | POA: Diagnosis present

## 2019-08-15 DIAGNOSIS — D500 Iron deficiency anemia secondary to blood loss (chronic): Secondary | ICD-10-CM

## 2019-08-15 DIAGNOSIS — Z5111 Encounter for antineoplastic chemotherapy: Secondary | ICD-10-CM | POA: Insufficient documentation

## 2019-08-15 MED ORDER — GOSERELIN ACETATE 3.6 MG ~~LOC~~ IMPL
3.6000 mg | DRUG_IMPLANT | Freq: Once | SUBCUTANEOUS | Status: AC
  Administered 2019-08-15: 3.6 mg via SUBCUTANEOUS

## 2019-09-01 ENCOUNTER — Other Ambulatory Visit (HOSPITAL_COMMUNITY)
Admission: RE | Admit: 2019-09-01 | Discharge: 2019-09-01 | Disposition: A | Discharge status: Home / Self Care | Payer: 59 | Source: Ambulatory Visit | Attending: Obstetrics and Gynecology | Admitting: Obstetrics and Gynecology

## 2019-09-01 ENCOUNTER — Other Ambulatory Visit: Payer: Self-pay

## 2019-09-01 ENCOUNTER — Encounter: Payer: Self-pay | Admitting: Obstetrics and Gynecology

## 2019-09-01 ENCOUNTER — Ambulatory Visit (INDEPENDENT_AMBULATORY_CARE_PROVIDER_SITE_OTHER): Payer: 59 | Admitting: Obstetrics and Gynecology

## 2019-09-01 VITALS — BP 134/87 | HR 87 | Temp 98.1°F | Ht 63.0 in | Wt 154.0 lb

## 2019-09-01 DIAGNOSIS — Z1272 Encounter for screening for malignant neoplasm of vagina: Secondary | ICD-10-CM | POA: Diagnosis not present

## 2019-09-01 DIAGNOSIS — Z01419 Encounter for gynecological examination (general) (routine) without abnormal findings: Secondary | ICD-10-CM | POA: Diagnosis present

## 2019-09-01 NOTE — Progress Notes (Signed)
Subjective:     Melanie Moss is a 44 y.o. female P42 (34 year old son) with BMI 27 who is here for a comprehensive physical exam. The patient reports no problems. Patient also desires consultation for hysterectomy with BSO. 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 Patient is currently receiving monthly hormonal treatment as part of suppressive therapy and it was recommended by her oncologist that she underwent a BSO. Patient reports amenorrhea for over a year with the presence of vasomotor symptoms. Patient is not sexually active. She denies any pelvic pain or abnormal discharge.  Past Medical History:  Diagnosis Date  . Anemia   . Anxiety   . Breast cancer of lower-outer quadrant of right female breast ([place]) 07/29/2015  . Family history of breast cancer   . Family history of colon cancer   . Family history of prostate cancer   . GERD (gastroesophageal reflux disease)    tums   . History of radiation therapy 03/07/16-04/27/16   right chest wall 45 Gy in 25 fractions, right mastectomy scar 16 Gy in 8 fractions  . Hyperlipidemia   . Hypertension   . Lymphedema   . Personal history of chemotherapy   . Personal history of radiation therapy   . Preterm labor    Past Surgical History:  Procedure Laterality Date  . AXILLARY LYMPH NODE DISSECTION Right 09/29/2015   Procedure: AXILLARY LYMPH NODE DISSECTION;  Surgeon: Excell Seltzer, MD;  Location: [place];  Service: General;  Laterality: Right;  . BREAST IMPLANT REMOVAL Right 06/01/2017   Procedure: EXPLANT OF RIGHT 480 MENTOR BREAST IMPLANT;  Surgeon: Wallace Going, DO;  Location: [place];  Service: Plastics;  Laterality: Right;  . BREAST RECONSTRUCTION Right 08/08/2017   Procedure: RIGHT BREAST RECONSTRUCTION WITH LATISSMA MYOCUTANEOUS FLAP AND EXPANDER PLACEMENT;  Surgeon: Wallace Going, DO;  Location: [place];  Service: Plastics;  Laterality: Right;  . BREAST  RECONSTRUCTION WITH PLACEMENT OF TISSUE EXPANDER AND FLEX HD (ACELLULAR HYDRATED DERMIS) Right 09/29/2015   Procedure: BREAST RECONSTRUCTION WITH PLACEMENT OF TISSUE EXPANDER AND FLEX HD (ACELLULAR HYDRATED DERMIS);  Surgeon: Loel Lofty Dillingham, DO;  Location: [place];  Service: Plastics;  Laterality: Right;  . CERVICAL CERCLAGE  02/19/2011   Procedure: CERCLAGE CERVICAL;  Surgeon: Agnes Lawrence, MD;  Location: [facility];  Service: Gynecology;  Laterality: N/A;  . DILATION AND CURETTAGE OF UTERUS    . INCISION AND DRAINAGE OF WOUND Right 04/19/2017   Procedure: IRRIGATION OF RIGHT BREAST POCKET;  Surgeon: Wallace Going, DO;  Location: [place];  Service: Plastics;  Laterality: Right;  . LATISSIMUS FLAP TO BREAST Right 08/08/2017   Procedure: LATISSIMUS FLAP TO BREAST;  Surgeon: Wallace Going, DO;  Location: [place];  Service: Plastics;  Laterality: Right;  . MASTECTOMY Right 2017   right 2017  . MASTECTOMY WITH AXILLARY LYMPH NODE DISSECTION Right 09/29/2015  . MASTOPEXY Left 03/11/2017   Procedure: MASTOPEXY FOR SYMENTRY;  Surgeon: Wallace Going, DO;  Location: [place];  Service: Plastics;  Laterality: Left;  . PORT-A-CATH REMOVAL  10/2016  . PORTACATH PLACEMENT N/A 09/29/2015   Procedure: INSERTION PORT-A-CATH;  Surgeon: Excell Seltzer, MD;  Location: [place];  Service: General;  Laterality: N/A;  . REMOVAL OF BILATERAL TISSUE EXPANDERS WITH PLACEMENT OF BILATERAL BREAST IMPLANTS Right 03/11/2017   Procedure: REMOVAL OF RIGHT TISSUE EXPANDERS WITH PLACEMRIGHT SILICONE  BREAST IMPLANTS;  Surgeon: Audelia Hives  S, DO;  Location: [place];  Service: Plastics;  Laterality: Right;  . REMOVAL OF TISSUE EXPANDER AND PLACEMENT OF IMPLANT Right 10/19/2017   Procedure: REMOVAL OF TISSUE EXPANDER;  Surgeon: Wallace Going, DO;  Location: [place];  Service: Plastics;  Laterality: Right;  . SIMPLE MASTECTOMY WITH AXILLARY  SENTINEL NODE BIOPSY Right 09/29/2015   Procedure: RIGHT TOTAL  MASTECTOMY WITH AXILLARY SENTINEL NODE BIOPSY;  Surgeon: Excell Seltzer, MD;  Location: [place];  Service: General;  Laterality: Right;   Family History  Problem Relation Age of Onset  . Hypertension Mother   . Hypertension Father   . Prostate cancer Father 20  . Breast cancer Maternal Aunt 34  . Breast cancer Paternal Aunt 64  . Cirrhosis Maternal Uncle   . Prostate cancer Paternal Uncle   . Throat cancer Maternal Aunt        non smoker  . Prostate cancer Paternal Uncle   . Colon cancer Cousin        maternal first cousin dx in his 68s  . Lung cancer Cousin        smoker    Social History   Socioeconomic History  . Marital status: Single    Spouse name: Not on file  . Number of children: 1  . Years of education: Not on file  . Highest education level: Not on file  Occupational History  . Not on file  Tobacco Use  . Smoking status: Never Smoker  . Smokeless tobacco: Never Used  Substance and Sexual Activity  . Alcohol use: No  . Drug use: No  . Sexual activity: Yes    Birth control/protection: None  Other Topics Concern  . Not on file  Social History Narrative  . Not on file   Social Determinants of Health   Financial Resource Strain:   . Difficulty of Paying Living Expenses:   Food Insecurity:   . Worried About Charity fundraiser in the Last Year:   . Arboriculturist in the Last Year:   Transportation Needs:   . Film/video editor (Medical):   Marland Kitchen Lack of Transportation (Non-Medical):   Physical Activity:   . Days of Exercise per Week:   . Minutes of Exercise per Session:   Stress:   . Feeling of Stress :   Social Connections:   . Frequency of Communication with Friends and Family:   . Frequency of Social Gatherings with Friends and Family:   . Attends Religious Services:   . Active Member of Clubs or Organizations:   . Attends Archivist Meetings:   Marland Kitchen Marital Status:    Intimate Partner Violence:   . Fear of Current or Ex-Partner:   . Emotionally Abused:   Marland Kitchen Physically Abused:   . Sexually Abused:    Health Maintenance  Topic Date Due  . COVID-19 Vaccine (1) Never done  . PAP SMEAR-Modifier  08/02/2015  . MAMMOGRAM  11/17/2019  . INFLUENZA VACCINE  11/22/2019  . TETANUS/TDAP  06/11/2021  . HIV Screening  Completed       Review of Systems Pertinent items noted in HPI and remainder of comprehensive ROS otherwise negative.   Objective:      GENERAL: Well-developed, well-nourished female in no acute distress.  HEENT: Normocephalic, atraumatic. Sclerae anicteric.  NECK: Supple. Normal thyroid.  LUNGS: Clear to auscultation bilaterally.  HEART: Regular rate and rhythm. BREASTS: Surgically absent right breast. No palpable masses or lymphadenopathy, skin changes, or  nipple drainage on left breast. ABDOMEN: Soft, nontender, nondistended. No organomegaly. PELVIC: Normal external female genitalia. Vagina is pink and rugated.  Normal discharge. Normal appearing cervix. Uterus is normal in size. No adnexal mass or tenderness. EXTREMITIES: No cyanosis, clubbing, or edema, 2+ distal pulses.    Assessment:    Healthy female exam.      Plan:    Pap smear collected Patient desires hysterectomy with BSO. Discussed possibility of robotic assisted hysterectomy with BSO to facilitate recovery. Patient is interested. Patient will be referred to see Dr. Ihor Dow for [facility] consultation. Patient will be contacted with abnormal results Patient to follow up as scheduled with oncologist with follow up testing scheduled in July See After Visit Summary for Counseling Recommendations

## 2019-09-01 NOTE — Progress Notes (Signed)
New GYN/PAP.  Referred for Hysterectomy

## 2019-09-03 LAB — CYTOLOGY - PAP
Comment: NEGATIVE
Diagnosis: NEGATIVE
Diagnosis: REACTIVE
High risk HPV: NEGATIVE

## 2019-09-04 ENCOUNTER — Other Ambulatory Visit: Payer: Self-pay

## 2019-09-04 ENCOUNTER — Encounter: Payer: Self-pay | Admitting: Obstetrics & Gynecology

## 2019-09-04 ENCOUNTER — Ambulatory Visit (INDEPENDENT_AMBULATORY_CARE_PROVIDER_SITE_OTHER): Payer: 59 | Admitting: Obstetrics & Gynecology

## 2019-09-04 VITALS — BP 124/88 | HR 75 | Ht 63.0 in | Wt 153.1 lb

## 2019-09-04 DIAGNOSIS — N951 Menopausal and female climacteric states: Secondary | ICD-10-CM

## 2019-09-04 DIAGNOSIS — C50511 Malignant neoplasm of lower-outer quadrant of right female breast: Secondary | ICD-10-CM

## 2019-09-04 DIAGNOSIS — D250 Submucous leiomyoma of uterus: Secondary | ICD-10-CM

## 2019-09-04 MED ORDER — PAROXETINE HCL 10 MG PO TABS: 10.0000 mg | ORAL_TABLET | Freq: Every day | ORAL | 3 refills | Status: DC

## 2019-09-04 NOTE — Progress Notes (Signed)
History:  44 y.o. [id number] here today for consults for [place] with BSO. Pt is requesting risk reducing surgery for h/o breast cancer. 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 Patient is currently receiving monthly hormonal treatment as part of suppressive therapy and it was recommended by her oncologist that she underwent a BSO. Patient reports amenorrhea for over a year with the presence of vasomotor symptoms. Patient is not sexually active. She denies any pelvic pain or abnormal discharge.   The following portions of the patient's history were reviewed and updated as appropriate: allergies, current medications, past family history, past medical history, past social history, past surgical history and problem list.  Review of Systems:  Pertinent items are noted in HPI.    Objective:  Physical Exam Blood pressure 124/88, pulse 75, height 5\' 3"  (1.6 m), weight 153 lb 1.3 oz (69.4 kg).  CONSTITUTIONAL: Well-developed, well-nourished female in no acute distress.  HENT:  Normocephalic, atraumatic EYES: Conjunctivae and EOM are normal. No scleral icterus.  NECK: Normal range of motion SKIN: Skin is warm and dry. No rash noted. Not diaphoretic.No pallor. [place]: Alert and oriented to person, place, and time. Normal coordination.  Abd: Soft, nontender and nondistended Pelvic: Normal appearing external genitalia; normal appearing vaginal mucosa and cervix.  Normal discharge.  Small uterus, no other palpable masses, no uterine or adnexal tenderness   Assessment & Plan:  Risk reducing surgery after breast cancer:  Patient desires surgical management with RATH with BSO.  The risks of surgery were discussed in detail with the patient including but not limited to: bleeding which may require transfusion or reoperation; infection which may require prolonged hospitalization or re-hospitalization and antibiotic therapy; injury to  bowel, bladder, ureters and major vessels or other surrounding organs; need for additional procedures including laparotomy; thromboembolic phenomenon, incisional problems and other postoperative or anesthesia complications.  Patient was told that the likelihood that her condition and symptoms will be treated effectively with this surgical management was very high; the postoperative expectations were also discussed in detail. The patient also understands the alternative treatment options which were discussed in full. All questions were answered.  She was told that she will be contacted by our surgical scheduler regarding the time and date of her surgery; routine preoperative instructions of having nothing to eat or drink after midnight on the day prior to surgery and also coming to the hospital 1 1/2 hours prior to her time of surgery were also emphasized.  She was told she may be called for a preoperative appointment about a week prior to surgery and will be given further preoperative instructions at that visit. Printed patient education handouts about the procedure were given to the patient to review at home.  Sx menopausal state due to chemo - incapacitating hot flusehs.   Reviewed options  Paxil 10mg  po q day  F/u in 4 weeks or sooner prn  Total face-to-face time with patient was 35 min.  Greater than 50% was spent in counseling and coordination of care with the patient. Full review of her PMH and records were completed.   Jakwan Sally L. Harraway-Smith, M.D., Cherlynn June

## 2019-09-04 NOTE — Patient Instructions (Signed)
Total Laparoscopic Hysterectomy °A total laparoscopic hysterectomy is a minimally invasive surgery to remove the uterus and cervix. The fallopian tubes and ovaries can also be removed (bilateral salpingo-oophorectomy) during this surgery, if necessary. This procedure may be done to treat problems such as: °· Noncancerous growths in the uterus (uterine fibroids) that cause symptoms. °· A condition that causes the lining of the uterus (endometrium) to grow in other areas (endometriosis). °· Problems with pelvic support. This is caused by weakened muscles of the pelvis following vaginal childbirth or menopause. °· Cancer of the cervix, ovaries, uterus, or endometrium. °· Excessive (dysfunctional) uterine bleeding. °This surgery is performed by inserting a thin, lighted tube (laparoscope) and surgical instruments into small incisions in the abdomen. The laparoscope sends images to a monitor. The images help the health care provider perform the procedure. After this procedure, you will no longer be able to have a baby, and you will no longer have a menstrual period. °Tell a health care provider about: °· Any allergies you have. °· All medicines you are taking, including vitamins, herbs, eye drops, creams, and over-the-counter medicines. °· Any problems you or family members have had with anesthetic medicines. °· Any blood disorders you have. °· Any surgeries you have had. °· Any medical conditions you have. °· Whether you are pregnant or may be pregnant. °What are the risks? °Generally, this is a safe procedure. However, problems may occur, including: °· Infection. °· Bleeding. °· Blood clots in the legs or lungs. °· Allergic reactions to medicines. °· Damage to other structures or organs. °· The risk that the surgery may have to be switched to the regular one in which a large incision is made in the abdomen (abdominal hysterectomy). °What happens before the procedure? °Staying hydrated °Follow instructions from your  health care provider about hydration, which may include: °· Up to 2 hours before the procedure - you may continue to drink clear liquids, such as water, clear fruit juice, black coffee, and plain tea °Eating and drinking restrictions °Follow instructions from your health care provider about eating and drinking, which may include: °· 8 hours before the procedure - stop eating heavy meals or foods such as meat, fried foods, or fatty foods. °· 6 hours before the procedure - stop eating light meals or foods, such as toast or cereal. °· 6 hours before the procedure - stop drinking milk or drinks that contain milk. °· 2 hours before the procedure - stop drinking clear liquids. °Medicines °· Ask your health care provider about: °? Changing or stopping your regular medicines. This is especially important if you are taking diabetes medicines or blood thinners. °? Taking over-the-counter medicines, vitamins, herbs, and supplements. °? Taking medicines such as aspirin and ibuprofen. These medicines can thin your blood. Do not take these medicines unless your health care provider tells you to take them. °· You may be given antibiotic medicine to help prevent infection. °· You may be asked to take laxatives. °· You may be given medicines to help prevent nausea and vomiting after the procedure. °General instructions °· Ask your health care provider how your surgical site will be marked or identified. °· You may be asked to shower with a germ-killing soap. °· Do not use any products that contain nicotine or tobacco, such as cigarettes and e-cigarettes. If you need help quitting, ask your health care provider. °· You may have an exam or testing, such as an ultrasound to determine the size and shape of your pelvic organs. °·   You may have a blood or urine sample taken. °· This procedure can affect the way you feel about yourself. Talk with your health care provider about the physical and emotional changes hysterectomy may  cause. °· Plan to have someone take you home from the hospital or clinic. °· Plan to have a responsible adult care for you for at least 24 hours after you leave the hospital or clinic. This is important. °What happens during the procedure? °· To lower your risk of infection: °? Your health care team will wash or sanitize their hands. °? Your skin will be washed with soap. °? Hair may be removed from the surgical area. °· An IV will be inserted into one of your veins. °· You will be given one or more of the following: °? A medicine to help you relax (sedative). °? A medicine to make you fall asleep (general anesthetic). °· You will be given antibiotic medicine through your IV. °· A tube may be inserted down your throat to help you breathe during the procedure. °· A gas (carbon dioxide) will be used to inflate your abdomen to allow your surgeon to see inside of your abdomen. °· Three or four small incisions will be made in your abdomen. °· A laparoscope will be inserted into one of your incisions. Surgical instruments will be inserted through the other incisions in order to perform the procedure. °· Your uterus and cervix may be removed through your vagina or cut into small pieces and removed through the small incisions. Any other organs that need to be removed will also be removed this way. °· Carbon dioxide will be released from inside of your abdomen. °· Your incisions will be closed with stitches (sutures). °· A bandage (dressing) may be placed over your incisions. °The procedure may vary among health care providers and hospitals. °What happens after the procedure? °· Your blood pressure, heart rate, breathing rate, and blood oxygen level will be monitored until the medicines you were given have worn off. °· You will be given medicine for pain and nausea as needed. °· Do not drive for 24 hours if you received a sedative. °Summary °· Total Laparoscopic hysterectomy is a procedure to remove your uterus, cervix and  sometimes the fallopian tubes and ovaries. °· This procedure can affect the way you feel about yourself. Talk with your health care provider about the physical and emotional changes hysterectomy may cause. °· After this procedure, you will no longer be able to have a baby, and you will no longer have a menstrual period. °· You will be given pain medicine to control discomfort after this procedure. °This information is not intended to replace advice given to you by your health care provider. Make sure you discuss any questions you have with your health care provider. °Document Revised: 03/22/2017 Document Reviewed: 06/20/2016 °Elsevier Patient Education © 2020 [employer] °Total Laparoscopic Hysterectomy, Care After °This sheet gives you information about how to care for yourself after your procedure. Your health care provider may also give you more specific instructions. If you have problems or questions, contact your health care provider. °What can I expect after the procedure? °After the procedure, it is common to have: °· Pain and bruising around your incisions. °· A sore throat, if a breathing tube was used during surgery. °· Fatigue. °· Poor appetite. °· Less interest in sex. °If your ovaries were also removed, it is also common to have symptoms of menopause such as hot flashes, night sweats,   and lack of sleep (insomnia). °Follow these instructions at home: °Bathing °· Do not take baths, swim, or use a hot tub until your health care provider approves. You may need to only take showers for 2-3 weeks. °· Keep your bandage (dressing) dry until your health care provider says it can be removed. °Incision care ° °· Follow instructions from your health care provider about how to take care of your incisions. Make sure you: °? Wash your hands with soap and water before you change your dressing. If soap and water are not available, use hand sanitizer. °? Change your dressing as told by your health care  provider. °? Leave stitches (sutures), skin glue, or adhesive strips in place. These skin closures may need to stay in place for 2 weeks or longer. If adhesive strip edges start to loosen and curl up, you may trim the loose edges. Do not remove adhesive strips completely unless your health care provider tells you to do that. °· Check your incision area every day for signs of infection. Check for: °? Redness, swelling, or pain. °? Fluid or blood. °? Warmth. °? Pus or a bad smell. °Activity °· Get plenty of rest and sleep. °· Do not lift anything that is heavier than 10 lbs (4.5 kg) for one month after surgery, or as long as told by your health care provider. °· Do not drive or use heavy machinery while taking prescription pain medicine. °· Do not drive for 24 hours if you were given a medicine to help you relax (sedative). °· Return to your normal activities as told by your health care provider. Ask your health care provider what activities are safe for you. °Lifestyle ° °· Do not use any products that contain nicotine or tobacco, such as cigarettes and e-cigarettes. These can delay healing. If you need help quitting, ask your health care provider. °· Do not drink alcohol until your health care provider approves. °General instructions °· Do not douche, use tampons, or have sex for at least 6 weeks, or as told by your health care provider. °· Take over-the-counter and prescription medicines only as told by your health care provider. °· To monitor yourself for a fever, take your temperature at least once a day during recovery. °· If you struggle with physical or emotional changes after your procedure, speak with your health care provider or a therapist. °· To prevent or treat constipation while you are taking prescription pain medicine, your health care provider may recommend that you: °? Drink enough fluid to keep your urine clear or pale yellow. °? Take over-the-counter or prescription medicines. °? Eat foods that  are high in fiber, such as fresh fruits and vegetables, whole grains, and beans. °? Limit foods that are high in fat and processed sugars, such as fried and sweet foods. °· Keep all follow-up visits as told by your health care provider. This is important. °Contact a health care provider if: °· You have chills or a fever. °· You have redness, swelling, or pain around an incision. °· You have fluid or blood coming from an incision. °· Your incision feels warm to the touch. °· You have pus or a bad smell coming from an incision. °· An incision breaks open. °· You feel dizzy or light-headed. °· You have pain or bleeding when you urinate. °· You have diarrhea, nausea, or vomiting that does not go away. °· You have abnormal vaginal discharge. °· You have a rash. °· You have pain that does   not get better with medicine. °Get help right away if: °· You have a fever and your symptoms suddenly get worse. °· You have severe abdominal pain. °· You have chest pain. °· You have shortness of breath. °· You faint. °· You have pain, swelling, or redness on your leg. °· You have heavy vaginal bleeding with blood clots. °Summary °· After the procedure it is common to have abdominal pain. Your provider will give you medication for this. °· Do not take baths, swim, or use a hot tub until your health care provider approves. °· Do not lift anything that is heavier than 10 lbs (4.5 kg) for one month after surgery, or as long as told by your health care provider. °· Notify your provider if you have any signs or symptoms of infection after the procedure. °This information is not intended to replace advice given to you by your health care provider. Make sure you discuss any questions you have with your health care provider. °Document Revised: 03/22/2017 Document Reviewed: 06/20/2016 °Elsevier Patient Education © 2020 [employer] Inc. ° °

## 2019-09-07 ENCOUNTER — Other Ambulatory Visit: Payer: Self-pay | Admitting: Obstetrics & Gynecology

## 2019-09-07 NOTE — Addendum Note (Signed)
Addended by: Lavonia Drafts on: 09/07/2019 10:39 AM   Modules accepted: Orders

## 2019-09-10 ENCOUNTER — Inpatient Hospital Stay: Payer: No Typology Code available for payment source | Attending: Hematology

## 2019-09-10 ENCOUNTER — Other Ambulatory Visit: Payer: Self-pay

## 2019-09-10 VITALS — BP 128/72 | HR 72 | Temp 98.2°F | Resp 18

## 2019-09-10 DIAGNOSIS — C50511 Malignant neoplasm of lower-outer quadrant of right female breast: Secondary | ICD-10-CM | POA: Diagnosis present

## 2019-09-10 DIAGNOSIS — D500 Iron deficiency anemia secondary to blood loss (chronic): Secondary | ICD-10-CM

## 2019-09-10 DIAGNOSIS — Z5111 Encounter for antineoplastic chemotherapy: Secondary | ICD-10-CM | POA: Diagnosis present

## 2019-09-10 MED ORDER — GOSERELIN ACETATE 3.6 MG ~~LOC~~ IMPL
3.6000 mg | DRUG_IMPLANT | Freq: Once | SUBCUTANEOUS | Status: AC
  Administered 2019-09-10: 3.6 mg via SUBCUTANEOUS

## 2019-09-10 MED ORDER — GOSERELIN ACETATE 3.6 MG ~~LOC~~ IMPL
DRUG_IMPLANT | SUBCUTANEOUS | Status: AC
  Filled 2019-09-10: qty 3.6

## 2019-09-10 NOTE — Patient Instructions (Signed)
Goserelin injection What is this medicine? GOSERELIN (GOE se rel in) is similar to a hormone found in the body. It lowers the amount of sex hormones that the body makes. Men will have lower testosterone levels and women will have lower estrogen levels while taking this medicine. In men, this medicine is used to treat prostate cancer; the injection is either given once per month or once every 12 weeks. A once per month injection (only) is used to treat women with endometriosis, dysfunctional uterine bleeding, or advanced breast cancer. This medicine may be used for other purposes; ask your health care provider or pharmacist if you have questions. COMMON BRAND NAME(S): Zoladex What should I tell my health care provider before I take this medicine? They need to know if you have any of these conditions (some only apply to women): -diabetes -heart disease or previous heart attack -high blood pressure -high cholesterol -kidney disease -osteoporosis or low bone density -problems passing urine -spinal cord injury -stroke -tobacco smoker -an unusual or allergic reaction to goserelin, hormone therapy, other medicines, foods, dyes, or preservatives -pregnant or trying to get pregnant -breast-feeding How should I use this medicine? This medicine is for injection under the skin. It is given by a health care professional in a hospital or clinic setting. Men receive this injection once every 4 weeks or once every 12 weeks. Women will only receive the once every 4 weeks injection. Talk to your pediatrician regarding the use of this medicine in children. Special care may be needed. Overdosage: If you think you have taken too much of this medicine contact a poison control center or emergency room at once. NOTE: This medicine is only for you. Do not share this medicine with others. What if I miss a dose? It is important not to miss your dose. Call your doctor or health care professional if you are unable to  keep an appointment. What may interact with this medicine? -female hormones like estrogen -herbal or dietary supplements like black cohosh, chasteberry, or DHEA -female hormones like testosterone -prasterone This list may not describe all possible interactions. Give your health care provider a list of all the medicines, herbs, non-prescription drugs, or dietary supplements you use. Also tell them if you smoke, drink alcohol, or use illegal drugs. Some items may interact with your medicine. What should I watch for while using this medicine? Visit your doctor or health care professional for regular checks on your progress. Your symptoms may appear to get worse during the first weeks of this therapy. Tell your doctor or healthcare professional if your symptoms do not start to get better or if they get worse after this time. Your bones may get weaker if you take this medicine for a long time. If you smoke or frequently drink alcohol you may increase your risk of bone loss. A family history of osteoporosis, chronic use of drugs for seizures (convulsions), or corticosteroids can also increase your risk of bone loss. Talk to your doctor about how to keep your bones strong. This medicine should stop regular monthly menstration in women. Tell your doctor if you continue to menstrate. Women should not become pregnant while taking this medicine or for 12 weeks after stopping this medicine. Women should inform their doctor if they wish to become pregnant or think they might be pregnant. There is a potential for serious side effects to an unborn child. Talk to your health care professional or pharmacist for more information. Do not breast-feed an infant while taking   this medicine. Men should inform their doctors if they wish to father a child. This medicine may lower sperm counts. Talk to your health care professional or pharmacist for more information. What side effects may I notice from receiving this  medicine? Side effects that you should report to your doctor or health care professional as soon as possible: -allergic reactions like skin rash, itching or hives, swelling of the face, lips, or tongue -bone pain -breathing problems -changes in vision -chest pain -feeling faint or lightheaded, falls -fever, chills -pain, swelling, warmth in the leg -pain, tingling, numbness in the hands or feet -signs and symptoms of low blood pressure like dizziness; feeling faint or lightheaded, falls; unusually weak or tired -stomach pain -swelling of the ankles, feet, hands -trouble passing urine or change in the amount of urine -unusually high or low blood pressure -unusually weak or tired Side effects that usually do not require medical attention (report to your doctor or health care professional if they continue or are bothersome): -change in sex drive or performance -changes in breast size in both males and females -changes in emotions or moods -headache -hot flashes -irritation at site where injected -loss of appetite -skin problems like acne, dry skin -vaginal dryness This list may not describe all possible side effects. Call your doctor for medical advice about side effects. You may report side effects to FDA at 1-800-FDA-1088. Where should I keep my medicine? This drug is given in a hospital or clinic and will not be stored at home. NOTE: This sheet is a summary. It may not cover all possible information. If you have questions about this medicine, talk to your doctor, pharmacist, or health care provider.  2019 Elsevier/Gold Standard (2013-06-16 11:10:35)  

## 2019-09-12 ENCOUNTER — Ambulatory Visit: Payer: 59

## 2019-09-29 ENCOUNTER — Telehealth: Payer: Self-pay

## 2019-09-29 NOTE — Telephone Encounter (Signed)
Melanie Moss called.  She is having BSO on 7/6 and wanted to know if she needs her Zoladex injection this month?

## 2019-09-29 NOTE — Telephone Encounter (Signed)
Yes, she would have the last injection this month, OK to cancel future injections. Thanks   Truitt Merle MD

## 2019-09-30 ENCOUNTER — Encounter: Payer: Self-pay | Admitting: Obstetrics & Gynecology

## 2019-09-30 ENCOUNTER — Other Ambulatory Visit: Payer: Self-pay

## 2019-09-30 ENCOUNTER — Ambulatory Visit (INDEPENDENT_AMBULATORY_CARE_PROVIDER_SITE_OTHER): Payer: No Typology Code available for payment source | Admitting: Obstetrics & Gynecology

## 2019-09-30 VITALS — BP 123/82 | HR 83 | Ht 63.0 in | Wt 153.0 lb

## 2019-09-30 DIAGNOSIS — N951 Menopausal and female climacteric states: Secondary | ICD-10-CM

## 2019-09-30 MED ORDER — PAROXETINE HCL ER 12.5 MG PO TB24: 12.5000 mg | ORAL_TABLET | Freq: Every day | ORAL | 2 refills | Status: DC

## 2019-09-30 NOTE — Progress Notes (Signed)
History:  44 y.o. [id number] here today for f/u of post chemo hot flushes. Pt reports that her sx have improved on the Paxil but, she is still not sleeping at night due to hot flushes. THey have gone from a 10/10 in intensity to a 7/10.   The following portions of the patient's history were reviewed and updated as appropriate: allergies, current medications, past family history, past medical history, past social history, past surgical history and problem list.  Review of Systems:  Pertinent items are noted in HPI.    Objective:  Physical Exam Blood pressure 123/82, pulse 83, height 5\' 3"  (1.6 m), weight 153 lb (69.4 kg).  CONSTITUTIONAL: Well-developed, well-nourished female in no acute distress.  HENT:  Normocephalic, atraumatic EYES: Conjunctivae and EOM are normal. No scleral icterus.  NECK: Normal range of motion SKIN: Skin is warm and dry. No rash noted. Not diaphoretic.No pallor. [place]: Alert and oriented to person, place, and time. Normal coordination.   Assessment & Plan:  Post chemo menopausla hot flushes  Increase Paxil from 10mg  to 12.5CR daily  Melatonin qHS. Begin with 1 mg and increase q 2-3 days prn  Total face-to-face time with patient was 20 min.  Greater than 50% was spent in counseling and coordination of care with the patient.   Loomis Anacker L. Harraway-Smith, M.D., Cherlynn June

## 2019-09-30 NOTE — Patient Instructions (Signed)
Melatonin oral solid dosage forms What is this medicine? MELATONIN (mel uh TOH nin) is a dietary supplement. It is mostly promoted to help maintain normal sleep patterns. The FDA has not approved this supplement for any medical use. This supplement may be used for other purposes; ask your health care provider or pharmacist if you have questions. This medicine may be used for other purposes; ask your health care provider or pharmacist if you have questions. COMMON BRAND NAME(S): Melatonex What should I tell my health care provider before I take this medicine? They need to know if you have any of these conditions:  cancer  depression or mental illness  diabetes  hormone problems  if you often drink alcohol  immune system problems  liver disease  lung or breathing disease, like asthma  organ transplant  seizure disorder  an unusual or allergic reaction to melatonin, other medicines, foods, dyes, or preservatives  pregnant or trying to get pregnant  breast-feeding How should I use this medicine? Take this supplement by mouth with a glass of water. Do not take with food. This supplement is usually taken 1 or 2 hours before bedtime. After taking this supplement, limit your activities to those needed to prepare for bed. Some products may be chewed or dissolved in the mouth before swallowing. Some tablets or capsules must be swallowed whole; do not cut, crush or chew. Follow the directions on the package labeling, or take as directed by your health care professional. Do not take this supplement more often than directed. Talk to your pediatrician regarding the use of this supplement in children. Special care may be needed. This supplement is not recommended for use in children without a prescription. Overdosage: If you think you have taken too much of this medicine contact a poison control center or emergency room at once. NOTE: This medicine is only for you. Do not share this medicine  with others. What if I miss a dose? If you miss taking your dose at the usual time, skip that dose. If it is almost time for your next dose, take only that dose. Do not take double or extra doses. What may interact with this medicine? Do not take this medicine with any of the following medications:  fluvoxamine  ramelteon  tasimelteon This medicine may also interact with the following medications:  alcohol  caffeine  carbamazepine  certain antibiotics like ciprofloxacin  certain medicines for depression, anxiety, or psychotic disturbances  cimetidine  female hormones, like estrogens and birth control pills, patches, rings, or injections  methoxsalen  nifedipine  other medications for sleep  other herbal or dietary supplements  phenobarbital  rifampin  smoking tobacco  tamoxifen  warfarin This list may not describe all possible interactions. Give your health care provider a list of all the medicines, herbs, non-prescription drugs, or dietary supplements you use. Also tell them if you smoke, drink alcohol, or use illegal drugs. Some items may interact with your medicine. What should I watch for while using this medicine? See your doctor if your symptoms do not get better or if they get worse. Do not take this supplement for more than 2 weeks unless your doctor tells you to. You may get drowsy or dizzy. Do not drive, use machinery, or do anything that needs mental alertness until you know how this medicine affects you. Do not stand or sit up quickly, especially if you are an older patient. This reduces the risk of dizzy or fainting spells. Alcohol may interfere with   the effect of this medicine. Avoid alcoholic drinks. After taking this medicine, you may get up out of bed and do an activity that you do not know you are doing. The next morning, you may have no memory of this. Activities include driving a car ("sleep-driving"), making and eating food, talking on the phone,  sexual activity, and sleep-walking. Serious injuries have occurred. Call your doctor right away if you find out you have done any of these activities. Do not take this medicine if you have used alcohol that evening. Do not take it if you have taken another medicine for sleep. The risk of doing these sleep-related activities is higher. Talk to your doctor before you use this supplement if you are currently being treated for an emotional, mental, or sleep problem. This medicine may interfere with your treatment. Herbal or dietary supplements are not regulated like medicines. Rigid quality control standards are not required for dietary supplements. The purity and strength of these products can vary. The safety and effect of this dietary supplement for a certain disease or illness is not well known. This product is not intended to diagnose, treat, cure or prevent any disease. The Food and Drug Administration suggests the following to help consumers protect themselves:  Always read product labels and follow directions.  Natural does not mean a product is safe for humans to take.  Look for products that include USP after the ingredient name. This means that the manufacturer followed the standards of the [place] Pharmacopoeia.  Supplements made or sold by a nationally known food or drug company are more likely to be made under tight controls. You can write to the company for more information about how the product was made. What side effects may I notice from receiving this medicine? Side effects that you should report to your doctor or health care professional as soon as possible:  allergic reactions like skin rash, itching or hives, swelling of the face, lips, or tongue  breathing problems  confusion  depressed mood, irritable, or other changes in moods or behaviors  feeling faint or lightheaded, falls  increased blood pressure  irregular or missed menstrual periods  signs and symptoms of liver  injury like dark yellow or brown urine; general ill feeling or flu-like symptoms; light-colored stools; loss of appetite; nausea; right upper belly pain; unusually weak or tired; yellowing of the eyes or skin  trouble staying awake or alert during the day  unusual activities while you are still asleep like driving, eating, making phone calls  unusual bleeding or bruising Side effects that usually do not require medical attention (report to your doctor or health care professional if they continue or are bothersome):  dizziness  drowsiness  headache  hot flashes  nausea  tiredness  unusual dreams or nightmares  upset stomach This list may not describe all possible side effects. Call your doctor for medical advice about side effects. You may report side effects to FDA at 1-800-FDA-1088. Where should I keep my medicine? Keep out of the reach of children. Store at room temperature or as directed on the package label. Protect from moisture. Throw away any unused supplement after the expiration date. NOTE: This sheet is a summary. It may not cover all possible information. If you have questions about this medicine, talk to your doctor, pharmacist, or health care provider.  2020 Elsevier/Gold Standard (2017-10-04 12:11:58)  

## 2019-10-01 ENCOUNTER — Telehealth: Payer: Self-pay | Admitting: Hematology

## 2019-10-01 NOTE — Telephone Encounter (Signed)
Scheduled appt per 6/10 sch message- pt is aware of appt

## 2019-10-01 NOTE — Telephone Encounter (Signed)
I spoke with Ms Ellithorpe and relayed Dr. Ernestina Penna comments and recommendations. She verbalized understanding.  Scheduling message sent to schedule zoladex injection on 10/17/2019 per patient's request.

## 2019-10-13 ENCOUNTER — Other Ambulatory Visit: Payer: Self-pay | Admitting: Obstetrics & Gynecology

## 2019-10-13 DIAGNOSIS — N951 Menopausal and female climacteric states: Secondary | ICD-10-CM

## 2019-10-13 MED ORDER — PAROXETINE HCL ER 25 MG PO TB24: 25.0000 mg | ORAL_TABLET | Freq: Every day | ORAL | 3 refills | Status: DC

## 2019-10-14 NOTE — Patient Instructions (Addendum)
YOU ARE SCHEDULED FOR A COVID TEST 10-23-19@ 3:05 PM. THIS TEST MUST BE DONE BEFORE SURGERY. GO TO  [address], [place], [address] AND REMAIN IN YOUR CAR, THIS IS A DRIVE UP TEST. ONCE YOUR COVID TEST IS DONE PLEASE FOLLOW ALL THE QUARANTINE  INSTRUCTIONS GIVEN IN YOUR HANDOUT.      Your procedure is scheduled on 10-27-19   Report to [facility] AT 7:50 A. M.   Call this number if you have problems the morning of surgery  :[phone].   OUR ADDRESS IS [place].  WE ARE LOCATED IN [facility].                                     REMEMBER:  AFTER MIDNIGHT, YOU MAY HAVE A CLEAR LIQUID DIET UNTIL 6:50 AM. AFTER 6:50 AM, NOTHING UNTIL AFTER SURGERY .   CLEAR LIQUID DIET   Foods Allowed                                                                     Foods Excluded  Coffee and tea, regular and decaf                             liquids that you cannot  Plain Jell-O any favor except red or purple                                           see through such as: Fruit ices (not with fruit pulp)                                     milk, soups, orange juice  Iced Popsicles                                    All solid food Carbonated beverages, regular and diet                                    Cranberry, grape and apple juices Sports drinks like Gatorade Lightly seasoned clear broth or consume(fat free) Sugar, honey syrup   _____________________________________________________________________       YOU MAY  BRUSH YOUR TEETH MORNING OF SURGERY AND RINSE YOUR MOUTH OUT, NO CHEWING GUM CANDY OR MINTS.   TAKE THESE MEDICATIONS MORNING OF SURGERY WITH A SIP OF WATER:  Citalopram (Celexa), and Paxil (Paroxetine)  IF YOU ARE SPENDING THE NIGHT AFTER SURGERY PLEASE BRING ALL YOUR PRESCRIPTION MEDICATIONS IN THEIR ORIGINAL BOTTLES.  1 VISITOR IS ALLOWED IN WAITING ROOM ONLY DAY OF SURGERY.   NO VISITOR MAY SPEND THE NIGHT. VISITOR ARE  ALLOWED TO STAY UNTIL 8:00 PM.  DO NOT WEAR JEWERLY, MAKE UP, OR NAIL POLISH ON FINGERNAILS.  DO NOT WEAR LOTIONS, POWDERS, PERFUMES OR DEODORANT.  DO NOT SHAVE FOR 24 HOURS PRIOR TO DAY OF SURGERY.   CONTACTS, GLASSES, OR DENTURES MAY NOT BE WORN TO SURGERY.                                    [facility] IS NOT RESPONSIBLE  FOR ANY BELONGINGS.                                                                    Marland Kitchen                                                                                                    [facility] - Preparing for Surgery Before surgery, you can play an important role.  Because skin is not sterile, your skin needs to be as free of germs as possible.  You can reduce the number of germs on your skin by washing with CHG (chlorahexidine gluconate) soap before surgery.  CHG is an antiseptic cleaner which kills germs and bonds with the skin to continue killing germs even after washing. Please DO NOT use if you have an allergy to CHG or antibacterial soaps.  If your skin becomes reddened/irritated stop using the CHG and inform your nurse when you arrive at Short Stay. Do not shave (including legs and underarms) for at least 48 hours prior to the first CHG shower.  You may shave your face/neck. Please follow these instructions carefully:  1.  Shower with CHG Soap the night before surgery and the  morning of Surgery.  2.  If you choose to wash your hair, wash your hair first as usual with your  normal  shampoo.  3.  After you shampoo, rinse your hair and body thoroughly to remove the  shampoo.                           4.  Use CHG as you would any other liquid soap.  You can apply chg directly  to the skin and wash                       Gently with a scrungie or clean washcloth.  5.  Apply the CHG Soap to your body ONLY FROM THE NECK DOWN.   Do not use on face/ open                           Wound or open sores. Avoid contact with eyes, ears  mouth and genitals (private parts).  Wash face,  Genitals (private parts) with your normal soap.             6.  Wash thoroughly, paying special attention to the area where your surgery  will be performed.  7.  Thoroughly rinse your body with warm water from the neck down.  8.  DO NOT shower/wash with your normal soap after using and rinsing off  the CHG Soap.                9.  Pat yourself dry with a clean towel.            10.  Wear clean pajamas.            11.  Place clean sheets on your bed the night of your first shower and do not  sleep with pets. Day of Surgery : Do not apply any lotions/deodorants the morning of surgery.  Please wear clean clothes to the hospital/surgery center.  FAILURE TO FOLLOW THESE INSTRUCTIONS MAY RESULT IN THE CANCELLATION OF YOUR SURGERY PATIENT SIGNATURE_________________________________  NURSE SIGNATURE__________________________________  ________________________________________________________________________

## 2019-10-14 NOTE — Progress Notes (Signed)
Please place surgery orders. Thank you.

## 2019-10-14 NOTE — Progress Notes (Addendum)
COVID Vaccine Completed: x2  Date COVID Vaccine completed: COVID vaccine manufacturer: [employer]   PCP - Lucianne Lei, MD No back stimulator Cardiologist - n/A  Chest x-ray -  EKG -  Stress Test -  ECHO -  Cardiac Cath -   Sleep Study -  CPAP -   Fasting Blood Sugar -  Checks Blood Sugar _____ times a day  Blood Thinner Instructions: Aspirin Instructions: Last Dose:  Anesthesia review:   Perform house activities w/o SOB  Patient denies shortness of breath, fever, cough and chest pain at PAT appointment   Patient verbalized understanding of instructions that were given to them at the PAT appointment. Patient was also instructed that they will need to review over the PAT instructions again at home before surgery.

## 2019-10-16 ENCOUNTER — Other Ambulatory Visit: Payer: Self-pay

## 2019-10-16 ENCOUNTER — Encounter (HOSPITAL_COMMUNITY)
Admission: RE | Admit: 2019-10-16 | Discharge: 2019-10-16 | Disposition: A | Discharge status: Home / Self Care | Payer: No Typology Code available for payment source | Source: Ambulatory Visit | Attending: Obstetrics & Gynecology | Admitting: Obstetrics & Gynecology

## 2019-10-16 ENCOUNTER — Encounter (HOSPITAL_COMMUNITY): Payer: Self-pay

## 2019-10-16 DIAGNOSIS — Z01818 Encounter for other preprocedural examination: Secondary | ICD-10-CM | POA: Insufficient documentation

## 2019-10-16 LAB — TYPE AND SCREEN
ABO/RH(D): O POS
Antibody Screen: NEGATIVE

## 2019-10-16 LAB — CBC
HCT: 37.6 % (ref 36.0–46.0)
Hemoglobin: 12.1 g/dL (ref 12.0–15.0)
MCH: 28.6 pg (ref 26.0–34.0)
MCHC: 32.2 g/dL (ref 30.0–36.0)
MCV: 88.9 fL (ref 80.0–100.0)
Platelets: 298 10*3/uL (ref 150–400)
RBC: 4.23 MIL/uL (ref 3.87–5.11)
RDW: 12.5 % (ref 11.5–15.5)
WBC: 6.5 10*3/uL (ref 4.0–10.5)
nRBC: 0 % (ref 0.0–0.2)

## 2019-10-17 ENCOUNTER — Inpatient Hospital Stay: Payer: No Typology Code available for payment source | Attending: Hematology

## 2019-10-17 ENCOUNTER — Other Ambulatory Visit: Payer: Self-pay

## 2019-10-17 VITALS — BP 119/92 | HR 84 | Temp 98.6°F | Resp 16

## 2019-10-17 DIAGNOSIS — C50511 Malignant neoplasm of lower-outer quadrant of right female breast: Secondary | ICD-10-CM | POA: Insufficient documentation

## 2019-10-17 DIAGNOSIS — Z5111 Encounter for antineoplastic chemotherapy: Secondary | ICD-10-CM | POA: Insufficient documentation

## 2019-10-17 DIAGNOSIS — D500 Iron deficiency anemia secondary to blood loss (chronic): Secondary | ICD-10-CM

## 2019-10-17 MED ORDER — GOSERELIN ACETATE 3.6 MG ~~LOC~~ IMPL
3.6000 mg | DRUG_IMPLANT | Freq: Once | SUBCUTANEOUS | Status: AC
  Administered 2019-10-17: 3.6 mg via SUBCUTANEOUS

## 2019-10-17 NOTE — Patient Instructions (Signed)

## 2019-10-23 ENCOUNTER — Other Ambulatory Visit (HOSPITAL_COMMUNITY)
Admission: RE | Admit: 2019-10-23 | Discharge: 2019-10-23 | Disposition: A | Discharge status: Home / Self Care | Payer: No Typology Code available for payment source | Source: Ambulatory Visit | Attending: Obstetrics & Gynecology | Admitting: Obstetrics & Gynecology

## 2019-10-23 DIAGNOSIS — Z20822 Contact with and (suspected) exposure to covid-19: Secondary | ICD-10-CM | POA: Diagnosis not present

## 2019-10-23 DIAGNOSIS — Z01812 Encounter for preprocedural laboratory examination: Secondary | ICD-10-CM | POA: Insufficient documentation

## 2019-10-23 LAB — SARS CORONAVIRUS 2 (TAT 6-24 HRS): SARS Coronavirus 2: NEGATIVE

## 2019-10-26 ENCOUNTER — Encounter (HOSPITAL_BASED_OUTPATIENT_CLINIC_OR_DEPARTMENT_OTHER): Payer: Self-pay | Admitting: Obstetrics & Gynecology

## 2019-10-26 NOTE — Anesthesia Preprocedure Evaluation (Addendum)
Anesthesia Evaluation  Patient identified by MRN, date of birth, ID band Patient awake    Reviewed: Allergy & Precautions, NPO status , Patient's Chart, lab work & pertinent test results  Airway Mallampati: II  TM Distance: >3 FB Neck ROM: Full    Dental no notable dental hx. (+) Caps, Teeth Intact   Pulmonary neg pulmonary ROS,    Pulmonary exam normal breath sounds clear to auscultation       Cardiovascular hypertension, Pt. on medications Normal cardiovascular exam Rhythm:Regular Rate:Normal     Neuro/Psych Anxiety Depression negative neurological ROS     GI/Hepatic Neg liver ROS, GERD  Medicated and Controlled,  Endo/Other  Right Breast Ca- S/P mastectomy, ChemoRx and RT Hy(erlipidemia  Renal/GU negative Renal ROS  negative genitourinary   Musculoskeletal negative musculoskeletal ROS (+)   Abdominal   Peds  Hematology  (+) anemia ,   Anesthesia Other Findings   Reproductive/Obstetrics                            Anesthesia Physical Anesthesia Plan  ASA: II  Anesthesia Plan: General   Post-op Pain Management:    Induction: Intravenous and Cricoid pressure planned  PONV Risk Score and Plan: 4 or greater and Midazolam, Scopolamine patch - Pre-op, Ondansetron and Treatment may vary due to age or medical condition  Airway Management Planned: Oral ETT  Additional Equipment:   Intra-op Plan:   Post-operative Plan: Extubation in OR  Informed Consent: I have reviewed the patients History and Physical, chart, labs and discussed the procedure including the risks, benefits and alternatives for the proposed anesthesia with the patient or authorized representative who has indicated his/her understanding and acceptance.     Dental advisory given  Plan Discussed with: CRNA and Surgeon  Anesthesia Plan Comments:        Anesthesia Quick Evaluation

## 2019-10-27 ENCOUNTER — Other Ambulatory Visit: Payer: Self-pay

## 2019-10-27 ENCOUNTER — Ambulatory Visit (HOSPITAL_BASED_OUTPATIENT_CLINIC_OR_DEPARTMENT_OTHER)
Admission: RE | Admit: 2019-10-27 | Discharge: 2019-10-27 | Disposition: A | Discharge status: Home / Self Care | Payer: No Typology Code available for payment source | Source: Other Acute Inpatient Hospital | Attending: Obstetrics & Gynecology | Admitting: Obstetrics & Gynecology

## 2019-10-27 ENCOUNTER — Encounter (HOSPITAL_BASED_OUTPATIENT_CLINIC_OR_DEPARTMENT_OTHER)
Admission: RE | Disposition: A | Discharge status: Home / Self Care | Payer: Self-pay | Source: Other Acute Inpatient Hospital | Attending: Obstetrics & Gynecology

## 2019-10-27 ENCOUNTER — Ambulatory Visit (HOSPITAL_BASED_OUTPATIENT_CLINIC_OR_DEPARTMENT_OTHER): Payer: No Typology Code available for payment source | Admitting: Anesthesiology

## 2019-10-27 ENCOUNTER — Encounter (HOSPITAL_BASED_OUTPATIENT_CLINIC_OR_DEPARTMENT_OTHER): Payer: Self-pay | Admitting: Obstetrics & Gynecology

## 2019-10-27 DIAGNOSIS — D250 Submucous leiomyoma of uterus: Secondary | ICD-10-CM

## 2019-10-27 DIAGNOSIS — Z923 Personal history of irradiation: Secondary | ICD-10-CM | POA: Insufficient documentation

## 2019-10-27 DIAGNOSIS — Z9221 Personal history of antineoplastic chemotherapy: Secondary | ICD-10-CM | POA: Insufficient documentation

## 2019-10-27 DIAGNOSIS — K219 Gastro-esophageal reflux disease without esophagitis: Secondary | ICD-10-CM | POA: Diagnosis not present

## 2019-10-27 DIAGNOSIS — F419 Anxiety disorder, unspecified: Secondary | ICD-10-CM | POA: Insufficient documentation

## 2019-10-27 DIAGNOSIS — F329 Major depressive disorder, single episode, unspecified: Secondary | ICD-10-CM | POA: Insufficient documentation

## 2019-10-27 DIAGNOSIS — Z79899 Other long term (current) drug therapy: Secondary | ICD-10-CM | POA: Diagnosis not present

## 2019-10-27 DIAGNOSIS — Z4002 Encounter for prophylactic removal of ovary: Secondary | ICD-10-CM | POA: Insufficient documentation

## 2019-10-27 DIAGNOSIS — Z9011 Acquired absence of right breast and nipple: Secondary | ICD-10-CM

## 2019-10-27 DIAGNOSIS — D259 Leiomyoma of uterus, unspecified: Secondary | ICD-10-CM | POA: Insufficient documentation

## 2019-10-27 DIAGNOSIS — C50511 Malignant neoplasm of lower-outer quadrant of right female breast: Secondary | ICD-10-CM | POA: Diagnosis not present

## 2019-10-27 DIAGNOSIS — I10 Essential (primary) hypertension: Secondary | ICD-10-CM | POA: Insufficient documentation

## 2019-10-27 DIAGNOSIS — D251 Intramural leiomyoma of uterus: Secondary | ICD-10-CM | POA: Diagnosis not present

## 2019-10-27 DIAGNOSIS — Z9889 Other specified postprocedural states: Secondary | ICD-10-CM

## 2019-10-27 DIAGNOSIS — Z170 Estrogen receptor positive status [ER+]: Secondary | ICD-10-CM | POA: Diagnosis not present

## 2019-10-27 DIAGNOSIS — Z9882 Breast implant status: Secondary | ICD-10-CM | POA: Insufficient documentation

## 2019-10-27 DIAGNOSIS — E785 Hyperlipidemia, unspecified: Secondary | ICD-10-CM | POA: Diagnosis not present

## 2019-10-27 DIAGNOSIS — Z79811 Long term (current) use of aromatase inhibitors: Secondary | ICD-10-CM | POA: Insufficient documentation

## 2019-10-27 DIAGNOSIS — Z803 Family history of malignant neoplasm of breast: Secondary | ICD-10-CM

## 2019-10-27 HISTORY — PX: ROBOTIC ASSISTED TOTAL HYSTERECTOMY WITH BILATERAL SALPINGO OOPHERECTOMY: SHX6086

## 2019-10-27 LAB — POCT PREGNANCY, URINE: Preg Test, Ur: NEGATIVE

## 2019-10-27 SURGERY — HYSTERECTOMY, TOTAL, ROBOT-ASSISTED, LAPAROSCOPIC, WITH BILATERAL SALPINGO-OOPHORECTOMY
Anesthesia: General | Site: Abdomen | Laterality: Bilateral

## 2019-10-27 MED ORDER — PHENYLEPHRINE HCL-NACL 10-0.9 MG/250ML-% IV SOLN: INTRAVENOUS | Status: DC | PRN

## 2019-10-27 MED ORDER — PAROXETINE HCL ER 25 MG PO TB24: 25.0000 mg | ORAL_TABLET | Freq: Every day | ORAL | Status: DC

## 2019-10-27 MED ORDER — FENTANYL CITRATE (PF) 250 MCG/5ML IJ SOLN
INTRAMUSCULAR | Status: AC
  Filled 2019-10-27: qty 5

## 2019-10-27 MED ORDER — ONDANSETRON HCL 4 MG/2ML IJ SOLN
INTRAMUSCULAR | Status: DC | PRN
  Administered 2019-10-27 (×2): 4 mg via INTRAVENOUS

## 2019-10-27 MED ORDER — SOD CITRATE-CITRIC ACID 500-334 MG/5ML PO SOLN: 30.0000 mL | ORAL | Status: DC

## 2019-10-27 MED ORDER — MENTHOL 3 MG MT LOZG: 1.0000 | LOZENGE | OROMUCOSAL | Status: DC | PRN

## 2019-10-27 MED ORDER — LIDOCAINE 2% (20 MG/ML) 5 ML SYRINGE
INTRAMUSCULAR | Status: AC
  Filled 2019-10-27: qty 5

## 2019-10-27 MED ORDER — TRAMADOL HCL 50 MG PO TABS: 50.0000 mg | ORAL_TABLET | Freq: Four times a day (QID) | ORAL | Status: DC | PRN

## 2019-10-27 MED ORDER — HYDROMORPHONE HCL 1 MG/ML IJ SOLN
INTRAMUSCULAR | Status: AC
  Filled 2019-10-27: qty 1

## 2019-10-27 MED ORDER — SODIUM CHLORIDE 0.9 % IR SOLN
Status: DC | PRN
  Administered 2019-10-27: 3000 mL

## 2019-10-27 MED ORDER — LACTATED RINGERS IV SOLN: INTRAVENOUS | Status: DC

## 2019-10-27 MED ORDER — EPHEDRINE SULFATE-NACL 50-0.9 MG/10ML-% IV SOSY
PREFILLED_SYRINGE | INTRAVENOUS | Status: DC | PRN
  Administered 2019-10-27: 7.5 mg via INTRAVENOUS

## 2019-10-27 MED ORDER — HYDROMORPHONE HCL 1 MG/ML IJ SOLN: 0.2500 mg | INTRAMUSCULAR | Status: DC | PRN

## 2019-10-27 MED ORDER — EPHEDRINE 5 MG/ML INJ
INTRAVENOUS | Status: AC
  Filled 2019-10-27: qty 10

## 2019-10-27 MED ORDER — ONDANSETRON HCL 4 MG/2ML IJ SOLN
INTRAMUSCULAR | Status: AC
  Filled 2019-10-27: qty 2

## 2019-10-27 MED ORDER — SCOPOLAMINE 1 MG/3DAYS TD PT72
1.0000 | MEDICATED_PATCH | TRANSDERMAL | Status: DC
  Administered 2019-10-27: 1.5 mg via TRANSDERMAL

## 2019-10-27 MED ORDER — PROPOFOL 10 MG/ML IV BOLUS
INTRAVENOUS | Status: DC | PRN
  Administered 2019-10-27: 200 mg via INTRAVENOUS

## 2019-10-27 MED ORDER — BISACODYL 10 MG RE SUPP: 10.0000 mg | Freq: Every day | RECTAL | Status: DC | PRN

## 2019-10-27 MED ORDER — METOCLOPRAMIDE HCL 5 MG/ML IJ SOLN: 10.0000 mg | Freq: Once | INTRAMUSCULAR | Status: DC | PRN

## 2019-10-27 MED ORDER — ONDANSETRON HCL 4 MG PO TABS: 4.0000 mg | ORAL_TABLET | Freq: Four times a day (QID) | ORAL | Status: DC | PRN

## 2019-10-27 MED ORDER — GABAPENTIN 300 MG PO CAPS
ORAL_CAPSULE | ORAL | Status: AC
  Filled 2019-10-27: qty 1

## 2019-10-27 MED ORDER — SIMETHICONE 80 MG PO CHEW: 80.0000 mg | CHEWABLE_TABLET | Freq: Four times a day (QID) | ORAL | Status: DC | PRN

## 2019-10-27 MED ORDER — DEXAMETHASONE SODIUM PHOSPHATE 10 MG/ML IJ SOLN
INTRAMUSCULAR | Status: DC | PRN
  Administered 2019-10-27: 10 mg via INTRAVENOUS

## 2019-10-27 MED ORDER — MIDAZOLAM HCL 5 MG/5ML IJ SOLN
INTRAMUSCULAR | Status: DC | PRN
  Administered 2019-10-27: 2 mg via INTRAVENOUS

## 2019-10-27 MED ORDER — HYDROMORPHONE HCL 1 MG/ML IJ SOLN
0.2500 mg | INTRAMUSCULAR | Status: DC | PRN
  Administered 2019-10-27: 0.5 mg via INTRAVENOUS

## 2019-10-27 MED ORDER — CEFAZOLIN SODIUM-DEXTROSE 2-4 GM/100ML-% IV SOLN
INTRAVENOUS | Status: AC
  Filled 2019-10-27: qty 100

## 2019-10-27 MED ORDER — KETOROLAC TROMETHAMINE 30 MG/ML IJ SOLN
INTRAMUSCULAR | Status: AC
  Filled 2019-10-27: qty 1

## 2019-10-27 MED ORDER — CEFAZOLIN SODIUM-DEXTROSE 2-4 GM/100ML-% IV SOLN
2.0000 g | INTRAVENOUS | Status: AC
  Administered 2019-10-27: 2 g via INTRAVENOUS

## 2019-10-27 MED ORDER — BUPIVACAINE HCL (PF) 0.5 % IJ SOLN
INTRAMUSCULAR | Status: DC | PRN
  Administered 2019-10-27: 30 mL

## 2019-10-27 MED ORDER — KETOROLAC TROMETHAMINE 30 MG/ML IJ SOLN
INTRAMUSCULAR | Status: DC | PRN
  Administered 2019-10-27: 30 mg via INTRAVENOUS

## 2019-10-27 MED ORDER — MIDAZOLAM HCL 2 MG/2ML IJ SOLN
INTRAMUSCULAR | Status: AC
  Filled 2019-10-27: qty 2

## 2019-10-27 MED ORDER — ZOLPIDEM TARTRATE 5 MG PO TABS: 5.0000 mg | ORAL_TABLET | Freq: Every evening | ORAL | Status: DC | PRN

## 2019-10-27 MED ORDER — ACETAMINOPHEN 500 MG PO TABS
1000.0000 mg | ORAL_TABLET | ORAL | Status: AC
  Administered 2019-10-27: 1000 mg via ORAL

## 2019-10-27 MED ORDER — OXYCODONE HCL 5 MG PO TABS: 5.0000 mg | ORAL_TABLET | Freq: Once | ORAL | Status: DC | PRN

## 2019-10-27 MED ORDER — POLYETHYLENE GLYCOL 3350 17 G PO PACK: 17.0000 g | PACK | Freq: Every day | ORAL | Status: DC | PRN

## 2019-10-27 MED ORDER — ORAL CARE MOUTH RINSE: 15.0000 mL | Freq: Once | OROMUCOSAL | Status: DC

## 2019-10-27 MED ORDER — MEPERIDINE HCL 25 MG/ML IJ SOLN: 6.2500 mg | INTRAMUSCULAR | Status: DC | PRN

## 2019-10-27 MED ORDER — ROCURONIUM BROMIDE 10 MG/ML (PF) SYRINGE
PREFILLED_SYRINGE | INTRAVENOUS | Status: AC
  Filled 2019-10-27: qty 10

## 2019-10-27 MED ORDER — OXYCODONE-ACETAMINOPHEN 5-325 MG PO TABS: 1.0000 | ORAL_TABLET | ORAL | 0 refills | Status: DC | PRN

## 2019-10-27 MED ORDER — ONDANSETRON HCL 4 MG/2ML IJ SOLN: 4.0000 mg | Freq: Four times a day (QID) | INTRAMUSCULAR | Status: DC | PRN

## 2019-10-27 MED ORDER — SCOPOLAMINE 1 MG/3DAYS TD PT72
MEDICATED_PATCH | TRANSDERMAL | Status: AC
  Filled 2019-10-27: qty 1

## 2019-10-27 MED ORDER — HYDROMORPHONE HCL 1 MG/ML IJ SOLN
0.2000 mg | INTRAMUSCULAR | Status: DC | PRN
  Administered 2019-10-27: 0.5 mg via INTRAVENOUS

## 2019-10-27 MED ORDER — ONDANSETRON HCL 4 MG/2ML IJ SOLN: 4.0000 mg | Freq: Once | INTRAMUSCULAR | Status: DC | PRN

## 2019-10-27 MED ORDER — PANTOPRAZOLE SODIUM 40 MG PO TBEC
40.0000 mg | DELAYED_RELEASE_TABLET | Freq: Every day | ORAL | Status: DC
  Administered 2019-10-27: 40 mg via ORAL

## 2019-10-27 MED ORDER — IBUPROFEN 800 MG PO TABS: 800.0000 mg | ORAL_TABLET | Freq: Four times a day (QID) | ORAL | Status: DC

## 2019-10-27 MED ORDER — ROCURONIUM BROMIDE 100 MG/10ML IV SOLN
INTRAVENOUS | Status: DC | PRN
  Administered 2019-10-27: 50 mg via INTRAVENOUS

## 2019-10-27 MED ORDER — GABAPENTIN 300 MG PO CAPS
300.0000 mg | ORAL_CAPSULE | ORAL | Status: AC
  Administered 2019-10-27: 300 mg via ORAL

## 2019-10-27 MED ORDER — FENTANYL CITRATE (PF) 100 MCG/2ML IJ SOLN
INTRAMUSCULAR | Status: DC | PRN
  Administered 2019-10-27: 25 ug via INTRAVENOUS
  Administered 2019-10-27 (×2): 50 ug via INTRAVENOUS
  Administered 2019-10-27: 25 ug via INTRAVENOUS

## 2019-10-27 MED ORDER — PHENYLEPHRINE 40 MCG/ML (10ML) SYRINGE FOR IV PUSH (FOR BLOOD PRESSURE SUPPORT)
PREFILLED_SYRINGE | INTRAVENOUS | Status: DC | PRN
  Administered 2019-10-27: 80 ug via INTRAVENOUS

## 2019-10-27 MED ORDER — DOCUSATE SODIUM 100 MG PO CAPS
100.0000 mg | ORAL_CAPSULE | Freq: Two times a day (BID) | ORAL | Status: DC
  Administered 2019-10-27: 100 mg via ORAL

## 2019-10-27 MED ORDER — DOCUSATE SODIUM 100 MG PO CAPS
100.0000 mg | ORAL_CAPSULE | Freq: Two times a day (BID) | ORAL | 0 refills | Status: AC
Start: 2019-10-27 — End: 2019-11-01

## 2019-10-27 MED ORDER — KETOROLAC TROMETHAMINE 30 MG/ML IJ SOLN: 30.0000 mg | Freq: Four times a day (QID) | INTRAMUSCULAR | Status: DC

## 2019-10-27 MED ORDER — IBUPROFEN 600 MG PO TABS
600.0000 mg | ORAL_TABLET | Freq: Four times a day (QID) | ORAL | 1 refills | Status: DC | PRN
Start: 2019-10-27 — End: 2019-11-04

## 2019-10-27 MED ORDER — OXYCODONE HCL 5 MG/5ML PO SOLN: 5.0000 mg | Freq: Once | ORAL | Status: DC | PRN

## 2019-10-27 MED ORDER — OXYCODONE-ACETAMINOPHEN 5-325 MG PO TABS
ORAL_TABLET | ORAL | Status: AC
  Filled 2019-10-27: qty 1

## 2019-10-27 MED ORDER — CHLORHEXIDINE GLUCONATE 0.12 % MT SOLN: 15.0000 mL | Freq: Once | OROMUCOSAL | Status: DC

## 2019-10-27 MED ORDER — POVIDONE-IODINE 10 % EX SWAB: 2.0000 "application " | Freq: Once | CUTANEOUS | Status: DC

## 2019-10-27 MED ORDER — OXYCODONE-ACETAMINOPHEN 5-325 MG PO TABS
1.0000 | ORAL_TABLET | ORAL | Status: DC | PRN
  Administered 2019-10-27 (×2): 1 via ORAL

## 2019-10-27 MED ORDER — DEXTROSE-NACL 5-0.45 % IV SOLN: INTRAVENOUS | Status: AC

## 2019-10-27 MED ORDER — SUGAMMADEX SODIUM 200 MG/2ML IV SOLN
INTRAVENOUS | Status: DC | PRN
  Administered 2019-10-27: 150 mg via INTRAVENOUS

## 2019-10-27 MED ORDER — PROPOFOL 500 MG/50ML IV EMUL
INTRAVENOUS | Status: DC | PRN
  Administered 2019-10-27: 25 ug/kg/min via INTRAVENOUS

## 2019-10-27 MED ORDER — DOCUSATE SODIUM 100 MG PO CAPS
ORAL_CAPSULE | ORAL | Status: AC
  Filled 2019-10-27: qty 1

## 2019-10-27 MED ORDER — DEXAMETHASONE SODIUM PHOSPHATE 10 MG/ML IJ SOLN
INTRAMUSCULAR | Status: AC
  Filled 2019-10-27: qty 1

## 2019-10-27 MED ORDER — PANTOPRAZOLE SODIUM 40 MG PO TBEC
DELAYED_RELEASE_TABLET | ORAL | Status: AC
  Filled 2019-10-27: qty 1

## 2019-10-27 MED ORDER — PHENYLEPHRINE 40 MCG/ML (10ML) SYRINGE FOR IV PUSH (FOR BLOOD PRESSURE SUPPORT)
PREFILLED_SYRINGE | INTRAVENOUS | Status: AC
  Filled 2019-10-27: qty 10

## 2019-10-27 MED ORDER — ACETAMINOPHEN 500 MG PO TABS
ORAL_TABLET | ORAL | Status: AC
  Filled 2019-10-27: qty 2

## 2019-10-27 MED ORDER — PROPOFOL 10 MG/ML IV BOLUS
INTRAVENOUS | Status: AC
  Filled 2019-10-27: qty 20

## 2019-10-27 MED ORDER — KETOROLAC TROMETHAMINE 30 MG/ML IJ SOLN: 30.0000 mg | Freq: Once | INTRAMUSCULAR | Status: DC

## 2019-10-27 MED ORDER — LIDOCAINE 2% (20 MG/ML) 5 ML SYRINGE
INTRAMUSCULAR | Status: DC | PRN
  Administered 2019-10-27: 80 mg via INTRAVENOUS

## 2019-10-27 SURGICAL SUPPLY — 53 items
ADH SKN CLS APL DERMABOND .7 (GAUZE/BANDAGES/DRESSINGS) ×1
CATH FOLEY 3WAY  5CC 16FR (CATHETERS) ×2
CATH FOLEY 3WAY 5CC 16FR (CATHETERS) ×1 IMPLANT
COVER BACK TABLE 60X90IN (DRAPES) ×2 IMPLANT
COVER TIP SHEARS 8 DVNC (MISCELLANEOUS) ×1 IMPLANT
COVER TIP SHEARS 8MM DA VINCI (MISCELLANEOUS) ×2
DECANTER SPIKE VIAL GLASS SM (MISCELLANEOUS) ×2 IMPLANT
DEFOGGER SCOPE WARMER CLEARIFY (MISCELLANEOUS) ×2 IMPLANT
DERMABOND ADVANCED (GAUZE/BANDAGES/DRESSINGS) ×1
DERMABOND ADVANCED .7 DNX12 (GAUZE/BANDAGES/DRESSINGS) ×1 IMPLANT
DRAPE ARM DVNC X/XI (DISPOSABLE) ×3 IMPLANT
DRAPE COLUMN DVNC XI (DISPOSABLE) ×1 IMPLANT
DRAPE DA VINCI XI ARM (DISPOSABLE) ×8
DRAPE DA VINCI XI COLUMN (DISPOSABLE) ×2
DURAPREP 26ML APPLICATOR (WOUND CARE) ×2 IMPLANT
ELECT REM PT RETURN 9FT ADLT (ELECTROSURGICAL) ×2
ELECTRODE REM PT RTRN 9FT ADLT (ELECTROSURGICAL) ×1 IMPLANT
GLOVE BIO SURGEON STRL SZ7 (GLOVE) ×8 IMPLANT
GLOVE BIOGEL PI IND STRL 6.5 (GLOVE) IMPLANT
GLOVE BIOGEL PI IND STRL 7.0 (GLOVE) ×3 IMPLANT
GLOVE BIOGEL PI INDICATOR 6.5 (GLOVE) ×2
GLOVE BIOGEL PI INDICATOR 7.0 (GLOVE) ×7
GLOVE ECLIPSE 6.5 STRL STRAW (GLOVE) ×2 IMPLANT
IRRIG SUCT STRYKERFLOW 2 WTIP (MISCELLANEOUS) ×2
IRRIGATION SUCT STRKRFLW 2 WTP (MISCELLANEOUS) ×1 IMPLANT
LEGGING LITHOTOMY PAIR STRL (DRAPES) ×2 IMPLANT
MANIFOLD NEPTUNE II (INSTRUMENTS) ×1 IMPLANT
NEEDLE INSUFFLATION 120MM (ENDOMECHANICALS) ×2 IMPLANT
OBTURATOR OPTICAL STANDARD 8MM (TROCAR) ×2
OBTURATOR OPTICAL STND 8 DVNC (TROCAR) ×1
OBTURATOR OPTICALSTD 8 DVNC (TROCAR) ×1 IMPLANT
OCCLUDER COLPOPNEUMO (BALLOONS) ×2 IMPLANT
PACK ROBOT WH (CUSTOM PROCEDURE TRAY) ×2 IMPLANT
PACK ROBOTIC GOWN (GOWN DISPOSABLE) ×2 IMPLANT
PACK TRENDGUARD 450 HYBRID PRO (MISCELLANEOUS) IMPLANT
PAD PREP 24X48 CUFFED NSTRL (MISCELLANEOUS) ×2 IMPLANT
PROTECTOR NERVE ULNAR (MISCELLANEOUS) ×4 IMPLANT
SEAL CANN UNIV 5-8 DVNC XI (MISCELLANEOUS) ×3 IMPLANT
SEAL XI 5MM-8MM UNIVERSAL (MISCELLANEOUS) ×6
SEALER VESSEL DA VINCI XI (MISCELLANEOUS) ×4
SEALER VESSEL EXT DVNC XI (MISCELLANEOUS) ×1 IMPLANT
SET IRRIG Y TYPE TUR BLADDER L (SET/KITS/TRAYS/PACK) ×2 IMPLANT
SET TRI-LUMEN FLTR TB AIRSEAL (TUBING) ×2 IMPLANT
SUT VIC AB 0 CT1 27 (SUTURE) ×4
SUT VIC AB 0 CT1 27XBRD ANBCTR (SUTURE) ×2 IMPLANT
SUT VICRYL 4-0 PS2 18IN ABS (SUTURE) ×4 IMPLANT
SUT VLOC 180 0 9IN  GS21 (SUTURE) ×2
SUT VLOC 180 0 9IN GS21 (SUTURE) ×1 IMPLANT
TIP UTERINE 5.1X6CM LAV DISP (MISCELLANEOUS) ×1 IMPLANT
TOWEL OR 17X26 10 PK STRL BLUE (TOWEL DISPOSABLE) ×2 IMPLANT
TRENDGUARD 450 HYBRID PRO PACK (MISCELLANEOUS) ×2
TROCAR PORT AIRSEAL 5X120 (TROCAR) ×2 IMPLANT
WATER STERILE IRR 1000ML POUR (IV SOLUTION) ×2 IMPLANT

## 2019-10-27 NOTE — Discharge Instructions (Signed)
Total Laparoscopic Hysterectomy, Care After This sheet gives you information about how to care for yourself after your procedure. Your health care provider may also give you more specific instructions. If you have problems or questions, contact your health care provider. What can I expect after the procedure? After the procedure, it is common to have:  Pain and bruising around your incisions.  A sore throat, if a breathing tube was used during surgery.  Fatigue.  Poor appetite.  Less interest in sex. If your ovaries were also removed, it is also common to have symptoms of menopause such as hot flashes, night sweats, and lack of sleep (insomnia). Follow these instructions at home: Bathing  Do not take baths, swim, or use a hot tub until your health care provider approves. You may need to only take showers for 2-3 weeks.  Keep your bandage (dressing) dry until your health care provider says it can be removed. Incision care   Follow instructions from your health care provider about how to take care of your incisions. Make sure you: ? Wash your hands with soap and water before you change your dressing. If soap and water are not available, use hand sanitizer. ? Change your dressing as told by your health care provider. ? Leave stitches (sutures), skin glue, or adhesive strips in place. These skin closures may need to stay in place for 2 weeks or longer. If adhesive strip edges start to loosen and curl up, you may trim the loose edges. Do not remove adhesive strips completely unless your health care provider tells you to do that.  Check your incision area every day for signs of infection. Check for: ? Redness, swelling, or pain. ? Fluid or blood. ? Warmth. ? Pus or a bad smell. Activity  Get plenty of rest and sleep.  Do not lift anything that is heavier than 10 lbs (4.5 kg) for one month after surgery, or as long as told by your health care provider.  Do not drive or use heavy  machinery while taking prescription pain medicine.  Do not drive for 24 hours if you were given a medicine to help you relax (sedative).  Return to your normal activities as told by your health care provider. Ask your health care provider what activities are safe for you. Lifestyle   Do not use any products that contain nicotine or tobacco, such as cigarettes and e-cigarettes. These can delay healing. If you need help quitting, ask your health care provider.  Do not drink alcohol until your health care provider approves. General instructions  Do not douche, use tampons, or have sex for at least 6 weeks, or as told by your health care provider.  Take over-the-counter and prescription medicines only as told by your health care provider.  To monitor yourself for a fever, take your temperature at least once a day during recovery.  If you struggle with physical or emotional changes after your procedure, speak with your health care provider or a therapist.  To prevent or treat constipation while you are taking prescription pain medicine, your health care provider may recommend that you: ? Drink enough fluid to keep your urine clear or pale yellow. ? Take over-the-counter or prescription medicines. ? Eat foods that are high in fiber, such as fresh fruits and vegetables, whole grains, and beans. ? Limit foods that are high in fat and processed sugars, such as fried and sweet foods.  Keep all follow-up visits as told by your health care provider.   This is important. Contact a health care provider if:  You have chills or a fever.  You have redness, swelling, or pain around an incision.  You have fluid or blood coming from an incision.  Your incision feels warm to the touch.  You have pus or a bad smell coming from an incision.  An incision breaks open.  You feel dizzy or light-headed.  You have pain or bleeding when you urinate.  You have diarrhea, nausea, or vomiting that does not  go away.  You have abnormal vaginal discharge.  You have a rash.  You have pain that does not get better with medicine. Get help right away if:  You have a fever and your symptoms suddenly get worse.  You have severe abdominal pain.  You have chest pain.  You have shortness of breath.  You faint.  You have pain, swelling, or redness on your leg.  You have heavy vaginal bleeding with blood clots. Summary  After the procedure it is common to have abdominal pain. Your provider will give you medication for this.  Do not take baths, swim, or use a hot tub until your health care provider approves.  Do not lift anything that is heavier than 10 lbs (4.5 kg) for one month after surgery, or as long as told by your health care provider.  Notify your provider if you have any signs or symptoms of infection after the procedure. This information is not intended to replace advice given to you by your health care provider. Make sure you discuss any questions you have with your health care provider. Document Revised: 03/22/2017 Document Reviewed: 06/20/2016 Elsevier Patient Education  2020 Elsevier Inc.  

## 2019-10-27 NOTE — Brief Op Note (Signed)
10/27/2019  11:36 AM  PATIENT:  Melanie Moss  44 y.o. 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   DISPOSITION OF SPECIMEN:  PATHOLOGY  COUNTS:  YES  TOURNIQUET:  * No tourniquets in log *  DICTATION: .Note written in [place]: extended observation than discharge to home in 6-8 hours.   PATIENT DISPOSITION:  PACU - hemodynamically stable.   Delay start of Pharmacological VTE agent (>24hrs) due to surgical blood loss or risk of bleeding: not applicable  Complications: none immediate.   Carolyn L. Harraway-Smith, M.D., Cherlynn June

## 2019-10-27 NOTE — Anesthesia Procedure Notes (Signed)
Procedure Name: Intubation Date/Time: 10/27/2019 9:43 AM Performed by: Gwyndolyn Saxon, CRNA Pre-anesthesia Checklist: Patient identified, Emergency Drugs available, Suction available and Patient being monitored Patient Re-evaluated:Patient Re-evaluated prior to induction Oxygen Delivery Method: Circle system utilized Preoxygenation: Pre-oxygenation with 100% oxygen Induction Type: IV induction Ventilation: Mask ventilation without difficulty Laryngoscope Size: Miller and 2 Grade View: Grade I Tube type: Oral Tube size: 7.0 mm Number of attempts: 1 Airway Equipment and Method: Stylet Placement Confirmation: ETT inserted through vocal cords under direct vision,  positive ETCO2 and breath sounds checked- equal and bilateral Tube secured with: Tape Dental Injury: Teeth and Oropharynx as per pre-operative assessment

## 2019-10-27 NOTE — Transfer of Care (Signed)
Immediate Anesthesia Transfer of Care Note  Patient: Melanie Moss  Procedure(s) Performed: XI ROBOTIC ASSISTED TOTAL HYSTERECTOMY WITH BILATERAL SALPINGO OOPHORECTOMY (Bilateral Abdomen)  Patient Location: PACU  Anesthesia Type:General  Level of Consciousness: drowsy and patient cooperative  Airway & Oxygen Therapy: Patient Spontanous Breathing and Patient connected to nasal cannula oxygen  Post-op Assessment: Report given to RN and Post -op Vital signs reviewed and stable  Post vital signs: Reviewed and stable  Last Vitals:  Vitals Value Taken Time  BP 123/77 10/27/19 1131  Temp    Pulse 86 10/27/19 1136  Resp 18 10/27/19 1136  SpO2 96 % 10/27/19 1136  Vitals shown include unvalidated device data.  Last Pain:  Vitals:   10/27/19 0806  TempSrc: Oral         Complications: No complications documented.

## 2019-10-27 NOTE — Op Note (Signed)
10/27/2019  11:36 AM  PATIENT:  Melanie Moss  44 y.o. 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   DISPOSITION OF SPECIMEN:  PATHOLOGY  COUNTS:  YES  TOURNIQUET:  * No tourniquets in log *  DICTATION: .Note written in [place]: extended observation than discharge to home in 6-8 hours.   PATIENT DISPOSITION:  PACU - hemodynamically stable.   Delay start of Pharmacological VTE agent (>24hrs) due to surgical blood loss or risk of bleeding: not applicable  Complications: none immediate.  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 She is s/p chemo/radiation treatment. Patient is currently receiving monthly hormonal treatment as part of suppressive therapy and it was recommended by her oncologist that she undergo a BSO. Pt opted for hysterectomy due to h/o AUB and fibroids. The risks, benefits, and alternatives of surgery were explained, understood, and accepted. Consents were signed. All questions were answered.   Findings: uterus with fibroids; normal fallopian tubes and ovaries bilaterally.    Patient was taken to the operating room and general anesthesia was applied without complication. She was placed in the dorsal lithotomy position and her abdomen and vagina were prepped and draped after she had been carefully positioned on the table. A bimanual exam revealed a  8 week size uterus that was mobile. Her adnexa were not enlarged. The cervix was measured and the uterus was sounded to 10 cm. A Rumi uterine manipulator was placed without difficulty. A Foley catheter was placed and it drained clear throughout the case. Gloves were changed and attention was turned to the abdomen. A 19mm incision was made in the umbilicus and a Veress needle was placed intraperitoneally. CO2 was used to insufflate the abdomen to approximately 4 L. After good pneumoperitoneum was established, a 8 mm trocar was placed in the umbilicus.  Laparoscopy confirmed correct placement. She was placed in Trendelenburg position and ports were placed in appropriate positions on her abdomen to allow maximum exposure during the robotic case. Specifically there was an 83mm assistant port placed in the left upper quadrant under direct laproscopic visualization. Two 8 mm ports were placed 8cm lateral to the midline port.  These were all placed under direct laparoscopic visualization. The robot was docked and I proceeded with a robotic portion of the case.  The ureters and the infundibulopelvic ligaments were identified. I excised the fallopian tubes bilaterally. The round ligament on each side was cauterized and cut. The Vessel sealer instrument was used for this portion. The round ligaments were identified, cauterized and ligated, a bladder flap was created anteriorly. The uterine vessels were identified and cauterized and then cut.The bladder was pushed out of the operative site and an anterior colpotomy was made. The colpotomy incision was extended circumferentially, following the blue outline of the Rumi manipulator. All pedicles were hemostatic.  The uterus was removed from the  vagina with the fallopian tube segments. The vaginal cuff was closed with v-lock suture.  Excellent hemostasis was noted throughout. The pelvis was irrigated. The intraabdominal pressure was lowered assess hemostasis. After determining  excellent hemostasis, the robot was undocked. At this point I performed cystoscopy. The cystoscopy revealed blue ejection from both ureters.  The midline fascial incision was closed with 0 vicryl.  The skin from all of the ports was closed with 3-0 vicryl. 30cc of 0.5% Marcaine was injected into the port sites.  The patient was then extubated and taken to recovery in stable condition.   An experienced assistant was required given the standard of surgical care given the complexity of the case.  This assistant was needed for exposure, dissection, suctioning, retraction, instrument exchange, and for overall help during the procedure.  Sponge, lap and needle counts were correct x 2.  Mallorey Odonell L. Harraway-Smith, M.D., [facility]

## 2019-10-27 NOTE — H&P (Signed)
Preoperative History and Physical  Melanie Moss is a 44 y.o. [phone] here for surgical management of breast cancer prophylaxis and h/o AUB.     Proposed surgery: Robot assisted total lap hysterectomy with bilateral salpingo-oophorectomy.   Past Medical History:  Diagnosis Date  . Anemia   . Anxiety   . Breast cancer of lower-outer quadrant of right female breast ([place]) 07/29/2015  . Family history of breast cancer   . Family history of colon cancer   . Family history of prostate cancer   . GERD (gastroesophageal reflux disease)    tums   . History of radiation therapy 03/07/16-04/27/16   right chest wall 45 Gy in 25 fractions, right mastectomy scar 16 Gy in 8 fractions  . Hyperlipidemia   . Hypertension   . Lymphedema   . Personal history of chemotherapy   . Personal history of radiation therapy   . Preterm labor    Past Surgical History:  Procedure Laterality Date  . AXILLARY LYMPH NODE DISSECTION Right 09/29/2015   Procedure: AXILLARY LYMPH NODE DISSECTION;  Surgeon: Excell Seltzer, MD;  Location: [facility];  Service: General;  Laterality: Right;  . BREAST IMPLANT REMOVAL Right 06/01/2017   Procedure: EXPLANT OF RIGHT 480 MENTOR BREAST IMPLANT;  Surgeon: Wallace Going, DO;  Location: [facility];  Service: Plastics;  Laterality: Right;  . BREAST RECONSTRUCTION Right 08/08/2017   Procedure: RIGHT BREAST RECONSTRUCTION WITH LATISSMA MYOCUTANEOUS FLAP AND EXPANDER PLACEMENT;  Surgeon: Wallace Going, DO;  Location: [facility];  Service: Plastics;  Laterality: Right;  . BREAST RECONSTRUCTION WITH PLACEMENT OF TISSUE EXPANDER AND FLEX HD (ACELLULAR HYDRATED DERMIS) Right 09/29/2015   Procedure: BREAST RECONSTRUCTION WITH PLACEMENT OF TISSUE EXPANDER AND FLEX HD (ACELLULAR HYDRATED DERMIS);  Surgeon: Loel Lofty Dillingham, DO;  Location: [facility];  Service: Plastics;  Laterality: Right;  . CERVICAL CERCLAGE  02/19/2011   Procedure: CERCLAGE CERVICAL;  Surgeon: Agnes Lawrence, MD;  Location:  [facility];  Service: Gynecology;  Laterality: N/A;  . DILATION AND CURETTAGE OF UTERUS    . INCISION AND DRAINAGE OF WOUND Right 04/19/2017   Procedure: IRRIGATION OF RIGHT BREAST POCKET;  Surgeon: Wallace Going, DO;  Location: [facility];  Service: Plastics;  Laterality: Right;  . LATISSIMUS FLAP TO BREAST Right 08/08/2017   Procedure: LATISSIMUS FLAP TO BREAST;  Surgeon: Wallace Going, DO;  Location: [facility];  Service: Plastics;  Laterality: Right;  . MASTECTOMY Right 2017   right 2017  . MASTECTOMY WITH AXILLARY LYMPH NODE DISSECTION Right 09/29/2015  . MASTOPEXY Left 03/11/2017   Procedure: MASTOPEXY FOR SYMENTRY;  Surgeon: Wallace Going, DO;  Location: [facility];  Service: Plastics;  Laterality: Left;  . PORT-A-CATH REMOVAL  10/2016  . PORTACATH PLACEMENT N/A 09/29/2015   Procedure: INSERTION PORT-A-CATH;  Surgeon: Excell Seltzer, MD;  Location: [facility];  Service: General;  Laterality: N/A;  . REMOVAL OF BILATERAL TISSUE EXPANDERS WITH PLACEMENT OF BILATERAL BREAST IMPLANTS Right 03/11/2017   Procedure: REMOVAL OF RIGHT TISSUE EXPANDERS WITH PLACEMRIGHT SILICONE  BREAST IMPLANTS;  Surgeon: Wallace Going, DO;  Location: [facility];  Service: Plastics;  Laterality: Right;  . REMOVAL OF TISSUE EXPANDER AND PLACEMENT OF IMPLANT Right 10/19/2017   Procedure: REMOVAL OF TISSUE EXPANDER;  Surgeon: Wallace Going, DO;  Location: [facility];  Service: Plastics;  Laterality: Right;  . SIMPLE MASTECTOMY WITH AXILLARY SENTINEL NODE BIOPSY Right 09/29/2015   Procedure: RIGHT TOTAL  MASTECTOMY WITH AXILLARY  SENTINEL NODE BIOPSY;  Surgeon: Excell Seltzer, MD;  Location: [facility];  Service: General;  Laterality: Right;   OB History    Gravida  6   Para  4   Term  0   Preterm  4   AB  1   Living  1     SAB  1   TAB  0   Ectopic  0   Multiple  0   Live Births  1          Patient denies any cervical dysplasia or  STIs. Medications Prior to Admission  Medication Sig Dispense Refill Last Dose  . B Complex-C (SUPER B COMPLEX PO) Take 1 tablet by mouth daily.   Past Week at Unknown time  . citalopram (CELEXA) 20 MG tablet Take 20 mg by mouth daily.   10/26/2019 at Unknown time  . ezetimibe (ZETIA) 10 MG tablet Take 10 mg by mouth at bedtime.   10/26/2019 at Unknown time  . irbesartan-hydrochlorothiazide (AVALIDE) 150-12.5 MG tablet Take 1 tablet by mouth daily.   10/26/2019 at Unknown time  . letrozole (FEMARA) 2.5 MG tablet Take 1 tablet (2.5 mg total) by mouth daily. 90 tablet 3 10/26/2019 at Unknown time  . PARoxetine (PAXIL CR) 25 MG 24 hr tablet Take 1 tablet (25 mg total) by mouth daily. 30 tablet 3 10/26/2019 at Unknown time  . Aspirin-Acetaminophen-Caffeine (GOODY HEADACHE PO) Take 1 Package by mouth daily as needed (Joint pain if don't have Aleve).   Unknown at Unknown time  . melatonin 1 MG TABS tablet Take 1 mg by mouth at bedtime as needed (sleep).   Unknown at Unknown time  . naproxen sodium (ALEVE) 220 MG tablet Take 440 mg by mouth daily as needed (Joint Pain).   Unknown at Unknown time  . Vitamin D3 (VITAMIN D) 25 MCG tablet Take 1,000 Units by mouth daily.   Unknown at Unknown time    No Known Allergies Social History:   reports that she has never smoked. She has never used smokeless tobacco. She reports that she does not drink alcohol and does not use drugs. Family History  Problem Relation Age of Onset  . Hypertension Mother   . Hypertension Father   . Prostate cancer Father 109  . Breast cancer Maternal Aunt 103  . Breast cancer Paternal Aunt 78  . Cirrhosis Maternal Uncle   . Prostate cancer Paternal Uncle   . Throat cancer Maternal Aunt        non smoker  . Prostate cancer Paternal Uncle   . Colon cancer Cousin        maternal first cousin dx in his 19s  . Lung cancer Cousin        smoker    Review of Systems: Noncontributory  PHYSICAL EXAM: Blood pressure (!) 127/94, pulse 85,  temperature 98.4 F (36.9 C), temperature source Oral, resp. rate 16, height 5\' 3"  (1.6 m), weight 70.2 kg, SpO2 100 %. General appearance - alert, well appearing, and in no distress Chest - clear to auscultation, no wheezes, rales or rhonchi, symmetric air entry Heart - normal rate and regular rhythm Abdomen - soft, nontender, nondistended, no masses or organomegaly Pelvic - examination not indicated Extremities - peripheral pulses normal, no pedal edema, no clubbing or cyanosis  Labs: Results for orders placed or performed during the hospital encounter of 10/27/19 (from the past 336 hour(s))  Pregnancy, urine POC   Collection Time: 10/27/19  8:04 AM  Result Value  Ref Range   Preg Test, Ur NEGATIVE NEGATIVE  Results for orders placed or performed during the hospital encounter of 10/23/19 (from the past 336 hour(s))  SARS CORONAVIRUS 2 (TAT 6-24 HRS) Nasopharyngeal Nasopharyngeal Swab   Collection Time: 10/23/19  3:06 PM   Specimen: Nasopharyngeal Swab  Result Value Ref Range   SARS Coronavirus 2 NEGATIVE NEGATIVE  Results for orders placed or performed during the hospital encounter of 10/16/19 (from the past 336 hour(s))  CBC   Collection Time: 10/16/19 11:09 AM  Result Value Ref Range   WBC 6.5 4.0 - 10.5 K/uL   RBC 4.23 3.87 - 5.11 MIL/uL   Hemoglobin 12.1 12.0 - 15.0 g/dL   HCT 37.6 36 - 46 %   MCV 88.9 80.0 - 100.0 fL   MCH 28.6 26.0 - 34.0 pg   MCHC 32.2 30.0 - 36.0 g/dL   RDW 12.5 11.5 - 15.5 %   Platelets 298 150 - 400 K/uL   nRBC 0.0 0.0 - 0.2 %  Type and screen [place]   Collection Time: 10/16/19 11:09 AM  Result Value Ref Range   ABO/RH(D) O POS    Antibody Screen NEG    Sample Expiration 10/30/2019,2359    Extend sample reason      NO TRANSFUSIONS OR PREGNANCY IN THE PAST 3 MONTHS Performed at [facility], [place] [address] [place], [place] [address]     Imaging Studies: No results found.  Assessment: Patient Active  Problem List   Diagnosis Date Noted  . Acquired absence of right breast 08/08/2017  . Genetic testing 08/16/2015  . Iron deficiency anemia due to chronic blood loss 08/03/2015  . Family history of breast cancer   . Family history of colon cancer   . Family history of prostate cancer   . Breast cancer of lower-outer quadrant of right female breast ([place]) 07/29/2015  . Leiomyoma of uterus, unspecified 08/01/2012  . Irregular menstrual cycle 08/01/2012  . 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  . HTN (hypertension) 12/22/2010  . Anemia 12/22/2010  . Acid reflux 12/22/2010    Plan: Patient will undergo surgical management with Robot assisted total lap hysterectomy with bilateral salpingo-oophorectomy.  The risks of surgery were discussed in detail with the patient including but not limited to: bleeding which may require transfusion or reoperation; infection which may require antibiotics; injury to surrounding organs which may involve bowel, bladder, ureters ; need for additional procedures including laparoscopy or laparotomy; thromboembolic phenomenon, surgical site problems and other postoperative/anesthesia complications. Likelihood of success in alleviating the patient's condition was discussed. Routine postoperative instructions will be reviewed with the patient and her family in detail after surgery.  The patient concurred with the proposed plan, giving informed written consent for the surgery.  Patient has been NPO since last night she will remain NPO for procedure.  Anesthesia and OR aware.  Preoperative prophylactic antibiotics and SCDs ordered on call to the OR.  To OR when ready.  Addelynn Batte L. Harraway-Smith, M.D., [facility] 10/27/2019 9:02 AM

## 2019-10-27 NOTE — Anesthesia Postprocedure Evaluation (Signed)
Anesthesia Post Note  Patient: Melanie Moss  Procedure(s) Performed: XI ROBOTIC ASSISTED TOTAL HYSTERECTOMY WITH BILATERAL SALPINGO OOPHORECTOMY (Bilateral Abdomen)     Patient location during evaluation: PACU Anesthesia Type: General Level of consciousness: awake and alert and oriented Pain management: pain level controlled Vital Signs Assessment: post-procedure vital signs reviewed and stable Respiratory status: spontaneous breathing, nonlabored ventilation and respiratory function stable Cardiovascular status: blood pressure returned to baseline and stable Postop Assessment: no apparent nausea or vomiting Anesthetic complications: no   No complications documented.  Last Vitals:  Vitals:   10/27/19 1230 10/27/19 1245  BP:    Pulse: 95 90  Resp: 16   Temp:  36.6 C  SpO2: 95% 94%    Last Pain:  Vitals:   10/27/19 1230  TempSrc:   PainSc: 4                  Kayron Kalmar A.

## 2019-10-28 ENCOUNTER — Telehealth: Payer: Self-pay | Admitting: Obstetrics & Gynecology

## 2019-10-28 ENCOUNTER — Encounter (HOSPITAL_BASED_OUTPATIENT_CLINIC_OR_DEPARTMENT_OTHER): Payer: Self-pay | Admitting: Obstetrics & Gynecology

## 2019-10-28 LAB — SURGICAL PATHOLOGY

## 2019-10-28 NOTE — Telephone Encounter (Signed)
TC to check on pt post op.  Pt reports feeling 'sore' but, the meds are helping with the pain. She is taking Motrin regularly and the Percocet when needed. Pt is tolerating po well. She is passing flatus. Walking around without difficulty.  No complaints. All questions answered.   Reinaldo Helt L. Harraway-Smith, M.D., [facility]

## 2019-10-28 NOTE — Discharge Summary (Signed)
Physician Discharge Summary  Patient ID: Melanie Moss MRN: [id number] DOB/AGE: December 17, 1975 44 y.o.  Admit date: 10/27/2019 Discharge date: 10/28/2019  Admission Diagnoses: risk reduction surgery after breast cancer  Discharge Diagnoses:  Active Problems:   Leiomyoma of uterus, unspecified   Breast cancer of lower-outer quadrant of right female breast [facility])   Family history of breast cancer   Acquired absence of right breast   Postoperative state   Post-operative state   Discharged Condition: good  Hospital Course: Patient had an uncomplicated surgery; for further details of this surgery, please refer to the operative note. Furthermore, the patient had an uncomplicated postoperative course.  By time of discharge, her pain was controlled on oral pain medications; she was ambulating, voiding without difficulty, tolerating regular diet and passing flatus.  She was deemed stable for discharge to home.    Consults: None  Significant Diagnostic Studies: labs: CBC and BMP and ECG  Treatments: surgery: Robot assisted total lap hysterectomy with BSO   Discharge Exam: Blood pressure 135/86, pulse 80, temperature 98.3 F (36.8 C), resp. rate 16, height 5\' 3"  (1.6 m), weight 70.2 kg, SpO2 96 %. General appearance: alert and no distress. Sitting up in bed talking. Somewhat groggy. Husband at bedside.    Disposition: Discharge disposition: 01-Home or Self Care       Discharge Instructions    Call MD for:  difficulty breathing, headache or visual disturbances   Complete by: As directed    Call MD for:  hives   Complete by: As directed    Call MD for:  persistant dizziness or light-headedness   Complete by: As directed    Call MD for:  persistant nausea and vomiting   Complete by: As directed    Call MD for:  redness, tenderness, or signs of infection (pain, swelling, redness, odor or green/yellow discharge around incision site)   Complete by: As directed    Call MD for:  severe  uncontrolled pain   Complete by: As directed    Call MD for:  temperature >100.4   Complete by: As directed    Diet - low sodium heart healthy   Complete by: As directed    Discharge wound care:   Complete by: As directed    Keep port sites clean and dry   Driving Restrictions   Complete by: As directed    No driving for 2 weeks   Increase activity slowly   Complete by: As directed    Lifting restrictions   Complete by: As directed    No heavy lifting for 6 weeks.   Sexual Activity Restrictions   Complete by: As directed    No sexual intercourse for 8 weeks     Allergies as of 10/27/2019   No Known Allergies     Medication List    STOP taking these medications   GOODY HEADACHE PO   naproxen sodium 220 MG tablet Commonly known as: ALEVE     TAKE these medications   citalopram 20 MG tablet Commonly known as: CELEXA Take 20 mg by mouth daily.   docusate sodium 100 MG capsule Commonly known as: COLACE Take 1 capsule (100 mg total) by mouth 2 (two) times daily for 5 days.   ezetimibe 10 MG tablet Commonly known as: ZETIA Take 10 mg by mouth at bedtime.   ibuprofen 600 MG tablet Commonly known as: ADVIL Take 1 tablet (600 mg total) by mouth every 6 (six) hours as needed for moderate pain or  cramping.   irbesartan-hydrochlorothiazide 150-12.5 MG tablet Commonly known as: AVALIDE Take 1 tablet by mouth daily.   letrozole 2.5 MG tablet Commonly known as: FEMARA Take 1 tablet (2.5 mg total) by mouth daily.   melatonin 1 MG Tabs tablet Take 1 mg by mouth at bedtime as needed (sleep).   oxyCODONE-acetaminophen 5-325 MG tablet Commonly known as: PERCOCET/ROXICET Take 1 tablet by mouth every 4 (four) hours as needed for severe pain.   PARoxetine 25 MG 24 hr tablet Commonly known as: Paxil CR Take 1 tablet (25 mg total) by mouth daily.   SUPER B COMPLEX PO Take 1 tablet by mouth daily.   Vitamin D3 25 MCG tablet Commonly known as: Vitamin D Take 1,000 Units  by mouth daily.            Discharge Care Instructions  (From admission, onward)         Start     Ordered   10/27/19 0000  Discharge wound care:       Comments: Keep port sites clean and dry   10/27/19 1201          Follow-up [facility] In 2 weeks.   Specialty: Internal Medicine Contact information: [address], [place] [phone]              Signed: Lavonia Drafts 10/28/2019, 1:17 PM

## 2019-11-04 ENCOUNTER — Other Ambulatory Visit: Payer: Self-pay

## 2019-11-04 DIAGNOSIS — N951 Menopausal and female climacteric states: Secondary | ICD-10-CM

## 2019-11-04 MED ORDER — PAROXETINE HCL ER 25 MG PO TB24: 25.0000 mg | ORAL_TABLET | Freq: Every day | ORAL | 0 refills | Status: DC

## 2019-11-04 NOTE — Progress Notes (Signed)
Patient request a 90 day supply of her paroxetine. Kathrene Alu RN

## 2019-11-12 ENCOUNTER — Encounter: Payer: Self-pay | Admitting: Obstetrics & Gynecology

## 2019-11-12 ENCOUNTER — Other Ambulatory Visit: Payer: Self-pay

## 2019-11-12 ENCOUNTER — Ambulatory Visit (INDEPENDENT_AMBULATORY_CARE_PROVIDER_SITE_OTHER): Payer: No Typology Code available for payment source | Admitting: Obstetrics & Gynecology

## 2019-11-12 VITALS — BP 135/87 | HR 83 | Ht 63.0 in | Wt 154.0 lb

## 2019-11-12 DIAGNOSIS — Z9889 Other specified postprocedural states: Secondary | ICD-10-CM

## 2019-11-12 NOTE — Progress Notes (Signed)
History:  44 y.o.LMP here today for 2 week post op check.Pt is s/p RATH with bilateral salpingectomy on 10/27/2019.  Pt reports that she is doing well. She is eating and passing stols without difficulty.   Her hot flushes are well managed now with the Paxil.   The following portions of the patient's history were reviewed and updated as appropriate: allergies, current medications, past family history, past medical history, past social history, past surgical history and problem list.  Review of Systems:  Pertinent items are noted in HPI.    Objective:  Physical Exam BP (!) 135/87   Pulse 83   Ht 5\' 3"  (1.6 m)   Wt 154 lb (69.9 kg)   LMP 04/23/2018   BMI 27.28 kg/m   CONSTITUTIONAL: Well-developed, well-nourished female in no acute distress.  HENT:  Normocephalic, atraumatic EYES: Conjunctivae and EOM are normal. No scleral icterus.  NECK: Normal range of motion SKIN: Skin is warm and dry. No rash noted. Not diaphoretic.No pallor. [place]: Alert and oriented to person, place, and time. Normal coordination.  Abd: Soft, nontender and nondistended; her port sites are healing well. .  Pelvic: deferred  Labs and Imaging Surg path 10/27/2019 FINAL MICROSCOPIC DIAGNOSIS:   A. UTERUS, CERVIX, BILATERAL TUBES AND OVARIES:  - Uterus with leiomyomata, largest measuring 2.2 cm  - Benign inactive endometrium  - Adenomyosis  - Benign unremarkable cervix  - Benign unremarkable bilateral fallopian tubes and ovaries  - No evidence of malignancy    Assessment & Plan:  2 week post op check following RATH with bilateral salpingectomy.   Doing well  Reviewed her surg path.   Reviewed post op instructions and activities  Gradual increase in activities  F/u in 4 weeks or sooner prn  Reviewed no intercourse for 8 weeks post op  All questions answered.   Titan Karner L. Harraway-Smith, M.D., [facility]

## 2019-11-13 ENCOUNTER — Encounter: Payer: Self-pay | Admitting: Obstetrics & Gynecology

## 2019-11-18 ENCOUNTER — Other Ambulatory Visit: Payer: Self-pay

## 2019-11-18 ENCOUNTER — Ambulatory Visit
Admission: RE | Admit: 2019-11-18 | Discharge: 2019-11-18 | Disposition: A | Discharge status: Home / Self Care | Payer: No Typology Code available for payment source | Source: Ambulatory Visit | Attending: Hematology | Admitting: Hematology

## 2019-11-18 DIAGNOSIS — C50511 Malignant neoplasm of lower-outer quadrant of right female breast: Secondary | ICD-10-CM

## 2019-11-25 ENCOUNTER — Ambulatory Visit: Payer: No Typology Code available for payment source | Admitting: Obstetrics & Gynecology

## 2019-12-04 ENCOUNTER — Ambulatory Visit (INDEPENDENT_AMBULATORY_CARE_PROVIDER_SITE_OTHER): Payer: No Typology Code available for payment source | Admitting: Obstetrics & Gynecology

## 2019-12-04 ENCOUNTER — Encounter: Payer: Self-pay | Admitting: Obstetrics & Gynecology

## 2019-12-04 ENCOUNTER — Other Ambulatory Visit: Payer: Self-pay

## 2019-12-04 VITALS — BP 121/88 | HR 91 | Temp 98.6°F | Ht 63.0 in

## 2019-12-04 DIAGNOSIS — Z9889 Other specified postprocedural states: Secondary | ICD-10-CM

## 2019-12-04 DIAGNOSIS — N951 Menopausal and female climacteric states: Secondary | ICD-10-CM

## 2019-12-04 NOTE — Progress Notes (Signed)
History:  44 y.o.LMP here today for 2 week post op check.Pt is s/p RATH with bilateral salpingectomy on 10/27/2019.  Pt reports that she is doing well. She is eating and passing stols without difficulty. Pt has an episode of light bleeding. None currently.  Her hot flashes are well controlled with Paxil.   The following portions of the patient's history were reviewed and updated as appropriate: allergies, current medications, past family history, past medical history, past social history, past surgical history and problem list.  Review of Systems:  Pertinent items are noted in HPI.    Objective:  Physical Exam BP 121/88   Pulse 91   Ht 5\' 3"  (1.6 m)   LMP 04/23/2018 Comment: Hysterectomy 10/28/19  BMI 27.28 kg/m   CONSTITUTIONAL: Well-developed, well-nourished female in no acute distress.  HENT:  Normocephalic, atraumatic EYES: Conjunctivae and EOM are normal. No scleral icterus.  NECK: Normal range of motion SKIN: Skin is warm and dry. No rash noted. Not diaphoretic.No pallor. [place]: Alert and oriented to person, place, and time. Normal coordination.  Abd: Soft, nontender and nondistended; her port sites are healing well. .  Pelvic: cuff healing well. No blood in vault.   Labs and Imaging Surg path 10/27/2019 UTERUS, CERVIX, BILATERAL TUBES AND OVARIES:  - Uterus with leiomyomata, largest measuring 2.2 cm  - Benign inactive endometrium  - Adenomyosis  - Benign unremarkable cervix  - Benign unremarkable bilateral fallopian tubes and ovaries  - No evidence of malignancy    Assessment & Plan:  6 week post op check following [place] with bilateral salpingectomy.   Doing well  Reviewed her surg path.   Reviewed post op instructions and activities  Gradual increase in activities  F/u in 3 months or sooner prn  Reviewed no intercourse for 8 weeks post op  All questions answered.   Enrika Aguado L. Harraway-Smith, M.D., Cherlynn June

## 2019-12-04 NOTE — Progress Notes (Signed)
Patient is six week post op. Kathrene Alu RN

## 2019-12-04 NOTE — Progress Notes (Signed)
[place]   [phone] [phone]   Clinic Follow up Note   Patient Care Team: Lucianne Lei, MD as PCP - General (Family Medicine) Excell Seltzer, MD (Inactive) as Consulting Physician (General Surgery) Truitt Merle, MD as Consulting Physician (Hematology) Gardenia Phlegm, NP as Nurse Practitioner (Hematology and Oncology) [name], MD as Consulting Physician (Radiation Oncology)  Date of Service:  12/07/2019  CHIEF COMPLAINT: F/u on right breast cancer  SUMMARY OF ONCOLOGIC HISTORY: Oncology History Overview Note  Breast cancer of lower-outer quadrant of right female breast [facility])   Staging form: Breast, AJCC 7th Edition     Clinical stage from 08/03/2015: Stage IA (T1c, N0, M0) - Unsigned     Pathologic stage from 09/29/2015: Stage IIIA (T3(m), N1a, cM0) - Signed by Truitt Merle, MD on 10/17/2015       Breast cancer of lower-outer quadrant of right female breast ([place])  07/25/2015 Mammogram   diagnostic mammogram and ultrasound showed a 11 mm mass in the 8:00 position of the right breast, small irregular lesion in the retroareolar regio of the right breast, contiguous with the dermis   07/26/2015 Initial Diagnosis   Breast cancer of lower-outer quadrant of right female breast ([place])   07/26/2015 Initial Biopsy   right breast needle core biopsy at 8:00 position (1), invasive ductal carcinoma, grade 3, 6:30 o'clock position invasive (2) and in situ ductal carcinoma, grade 1-2, lymphovascular invasion is identified   07/26/2015 Receptors her2   (1) ER 95% positive, PR 95% positive, Ki-67 30%, HER-2 positive with ratio 2.04, copy # 5.20, (2) ER 90% positive, PR 95% positive, Ki-67 10%, HER-2 negative.   08/02/2015 Imaging   breast MRI showed extensive mass  and non-mass enhancement in the lower outer quadrant of the right breast  spanning 13 cm, focal enhancement within the right nipple, at least 3-4 abnormal appearing right axillary lymph  node.   09/29/2015 Surgery   right mastectomy and axillar node dissection    09/29/2015 Pathology Results   Right mastectomy and axillary node dissection showed multifocal mammary carcinoma with ductal and lobular features, tumor size 13 cm, 2.1 cm, 1.9 cm, and 0.5 cm, grade 2-3, anterior margins were positive, 2 nodes with metastasis and one with isolated cells   11/01/2015 - 02/14/2016 Chemotherapy   TCHP every 3 weeks, for 6 cycles, followed by herceptin maintenance therapy for a total of one year    03/06/2016 - 11/01/2016 Antibody Plan   Maintenance Herceptin and perjeta, every 3 weeks   03/07/2016 - 04/27/2016 Radiation Therapy   03/07/16-04/27/16 Site/dose: 1) 38fd right chest wall/ 45 Gy in 25 fractions 2) Right mastectomy scar / 16 Gy in 8 fractions   05/21/2016 -  Anti-estrogen oral therapy   Tamoxifen 20 mg daily starting 05/21/16 Switched to Letrozole 2.'5mg'$  daily on 01/17/18.  Started monthyl Zolodex injections on 12/16/17. Last on 10/17/19 as she is s/p BSO on 10/27/19.    10/17/2016 Mammogram   IMPRESSION: No mammographic evidence of malignancy. A result letter of this screening mammogram will be mailed directly to the patient.   10/22/2016 Echocardiogram   Impressions:  - Normal study. Stable GLPSS at -21%. LVEF 60-65%.   11/09/2016 - 11/09/2017 Anti-estrogen oral therapy    Neratinib '240mg'$  daily started on 11/09/2016. Completed in one year   02/04/2017 Survivorship   Survivorship Clinic with CGardenia Phlegm NP   03/11/2017 Surgery   REMOVAL OF RIGHT TISSUE EXPANDERS WITH PLACEMRIGHT SILICONE  BREAST IMPLANTS and left  MASTOPEXY FOR SYMENTRY by Dr. Marla Roe  03/11/17   03/11/2017 Pathology Results   Diagnosis 03/11/17 1. Breast, capsule, right inferior BENIGN FIBROVASCULAR TISSUE 2. Breast, Mammoplasty, left BENIGN BREAST TISSUE NEGATIVE FOR ATYPIA OR MALIGNANCY   10/27/2019 Surgery   XI ROBOTIC ASSISTED TOTAL HYSTERECTOMY WITH BILATERAL SALPINGO OOPHORECTOMY  by Dr Ihor Dow and Dr Harolyn Rutherford   FINAL MICROSCOPIC DIAGNOSIS:   A. UTERUS, CERVIX, BILATERAL TUBES AND OVARIES:  - Uterus with leiomyomata, largest measuring 2.2 cm  - Benign inactive endometrium  - Adenomyosis  - Benign unremarkable cervix  - Benign unremarkable bilateral fallopian tubes and ovaries  - No evidence of malignancy       CURRENT THERAPY:  Letrozole2.'5mg'$  dailystartedon 9/27//2019   INTERVAL HISTORY:  [name] is here for a follow up of right breast cancer. She was last seen by me 6 months ago. He presents to the clinic alone. She notes her BSO went well. She has recovered well. She notes her fatigue and hot flashes improved after surgery. Her Gyn prescribed her Paxil and no longer has hot flashes. She notes she still has Celexa and will discuss stopping with her PCP. She sees her PCP every 6 months and her Gyn in 1 year.     REVIEW OF SYSTEMS:   Constitutional: Denies fevers, chills or abnormal weight loss Eyes: Denies blurriness of vision Ears, nose, mouth, throat, and face: Denies mucositis or sore throat Respiratory: Denies cough, dyspnea or wheezes Cardiovascular: Denies palpitation, chest discomfort or lower extremity swelling Gastrointestinal:  Denies nausea, heartburn or change in bowel habits Skin: Denies abnormal skin rashes Lymphatics: Denies new lymphadenopathy or easy bruising Neurological:Denies numbness, tingling or new weaknesses Behavioral/Psych: Mood is stable, no new changes  All other systems were reviewed with the patient and are negative.  MEDICAL HISTORY:  Past Medical History:  Diagnosis Date  . Anemia   . Anxiety   . Breast cancer of lower-outer quadrant of right female breast ([place]) 07/29/2015  . Family history of breast cancer   . Family history of colon cancer   . Family history of prostate cancer   . GERD (gastroesophageal reflux disease)    tums   . History of radiation therapy 03/07/16-04/27/16   right chest wall 45  Gy in 25 fractions, right mastectomy scar 16 Gy in 8 fractions  . Hyperlipidemia   . Hypertension   . Lymphedema   . Personal history of chemotherapy   . Personal history of radiation therapy   . Preterm labor     SURGICAL HISTORY: Past Surgical History:  Procedure Laterality Date  . AXILLARY LYMPH NODE DISSECTION Right 09/29/2015   Procedure: AXILLARY LYMPH NODE DISSECTION;  Surgeon: Excell Seltzer, MD;  Location: [place];  Service: General;  Laterality: Right;  . BREAST IMPLANT REMOVAL Right 06/01/2017   Procedure: EXPLANT OF RIGHT 480 MENTOR BREAST IMPLANT;  Surgeon: Wallace Going, DO;  Location: [place];  Service: Plastics;  Laterality: Right;  . BREAST RECONSTRUCTION Right 08/08/2017   Procedure: RIGHT BREAST RECONSTRUCTION WITH LATISSMA MYOCUTANEOUS FLAP AND EXPANDER PLACEMENT;  Surgeon: Wallace Going, DO;  Location: [place];  Service: Plastics;  Laterality: Right;  . BREAST RECONSTRUCTION WITH PLACEMENT OF TISSUE EXPANDER AND FLEX HD (ACELLULAR HYDRATED DERMIS) Right 09/29/2015   Procedure: BREAST RECONSTRUCTION WITH PLACEMENT OF TISSUE EXPANDER AND FLEX HD (ACELLULAR HYDRATED DERMIS);  Surgeon: Loel Lofty Dillingham, DO;  Location: [place];  Service: Plastics;  Laterality: Right;  . CERVICAL CERCLAGE  02/19/2011   Procedure: CERCLAGE  CERVICAL;  Surgeon: Roseanna Rainbow, MD;  Location: [facility];  Service: Gynecology;  Laterality: N/A;  . DILATION AND CURETTAGE OF UTERUS    . INCISION AND DRAINAGE OF WOUND Right 04/19/2017   Procedure: IRRIGATION OF RIGHT BREAST POCKET;  Surgeon: Peggye Form, DO;  Location: [facility];  Service: Plastics;  Laterality: Right;  . LATISSIMUS FLAP TO BREAST Right 08/08/2017   Procedure: LATISSIMUS FLAP TO BREAST;  Surgeon: Peggye Form, DO;  Location: [facility];  Service: Plastics;  Laterality: Right;  . MASTECTOMY Right 2017   right 2017  . MASTECTOMY WITH AXILLARY LYMPH NODE DISSECTION Right 09/29/2015  . MASTOPEXY Left  03/11/2017   Procedure: MASTOPEXY FOR SYMENTRY;  Surgeon: Peggye Form, DO;  Location: [facility];  Service: Plastics;  Laterality: Left;  . PORT-A-CATH REMOVAL  10/2016  . PORTACATH PLACEMENT N/A 09/29/2015   Procedure: INSERTION PORT-A-CATH;  Surgeon: Glenna Fellows, MD;  Location: [facility];  Service: General;  Laterality: N/A;  . REMOVAL OF BILATERAL TISSUE EXPANDERS WITH PLACEMENT OF BILATERAL BREAST IMPLANTS Right 03/11/2017   Procedure: REMOVAL OF RIGHT TISSUE EXPANDERS WITH PLACEMRIGHT SILICONE  BREAST IMPLANTS;  Surgeon: Peggye Form, DO;  Location: [facility];  Service: Plastics;  Laterality: Right;  . REMOVAL OF TISSUE EXPANDER AND PLACEMENT OF IMPLANT Right 10/19/2017   Procedure: REMOVAL OF TISSUE EXPANDER;  Surgeon: Peggye Form, DO;  Location: [facility];  Service: Plastics;  Laterality: Right;  . ROBOTIC ASSISTED TOTAL HYSTERECTOMY WITH BILATERAL SALPINGO OOPHERECTOMY Bilateral 10/27/2019   Procedure: XI ROBOTIC ASSISTED TOTAL HYSTERECTOMY WITH BILATERAL SALPINGO OOPHORECTOMY;  Surgeon: Willodean Rosenthal, MD;  Location: [facility];  Service: Gynecology;  Laterality: Bilateral;  . SIMPLE MASTECTOMY WITH AXILLARY SENTINEL NODE BIOPSY Right 09/29/2015   Procedure: RIGHT TOTAL  MASTECTOMY WITH AXILLARY SENTINEL NODE BIOPSY;  Surgeon: Glenna Fellows, MD;  Location: [facility];  Service: General;  Laterality: Right;    I have reviewed the social history and family history with the patient and they are unchanged from previous note.  ALLERGIES:  has No Known Allergies.  MEDICATIONS:  Current Outpatient Medications  Medication Sig Dispense Refill  . B Complex-C (SUPER B COMPLEX PO) Take 1 tablet by mouth daily.    . citalopram (CELEXA) 20 MG tablet Take 20 mg by mouth daily.    Marland Kitchen ezetimibe (ZETIA) 10 MG tablet Take 10 mg by mouth at bedtime.    . irbesartan-hydrochlorothiazide (AVALIDE) 150-12.5 MG tablet Take 1 tablet by  mouth daily.    Marland Kitchen letrozole (FEMARA) 2.5 MG tablet Take 1 tablet (2.5 mg total) by mouth daily. 90 tablet 3  . melatonin 1 MG TABS tablet Take 1 mg by mouth at bedtime as needed (sleep). (Patient not taking: Reported on 11/12/2019)    . PARoxetine (PAXIL CR) 25 MG 24 hr tablet Take 1 tablet (25 mg total) by mouth daily. 90 tablet 0  . Vitamin D3 (VITAMIN D) 25 MCG tablet Take 1,000 Units by mouth daily.     No current facility-administered medications for this visit.   Facility-Administered Medications Ordered in Other Visits  Medication Dose Route Frequency Provider Last Rate Last Admin  . 0.9 %  sodium chloride infusion   Intravenous Once Malachy Mood, MD      . alteplase (CATHFLO ACTIVASE) injection 2 mg  2 mg Intracatheter Once PRN Malachy Mood, MD      . heparin lock flush 100 unit/mL  250 Units Intracatheter Once PRN  Truitt Merle, MD      . heparin lock flush 100 unit/mL  500 Units Intravenous Once PRN Truitt Merle, MD      . sodium chloride 0.9 % injection 10 mL  10 mL Intracatheter PRN Truitt Merle, MD      . sodium chloride 0.9 % injection 10 mL  10 mL Intravenous PRN Truitt Merle, MD      . sodium chloride 0.9 % injection 3 mL  3 mL Intravenous Once PRN Truitt Merle, MD      . sodium chloride flush (NS) 0.9 % injection 10 mL  10 mL Intravenous PRN Truitt Merle, MD        PHYSICAL EXAMINATION: ECOG PERFORMANCE STATUS: 0 - Asymptomatic  Vitals:   12/07/19 0859  BP: (!) 139/95  Pulse: 86  Resp: 17  Temp: 98.1 F (36.7 C)  SpO2: 99%   Filed Weights   12/07/19 0859  Weight: 155 lb (70.3 kg)    GENERAL:alert, no distress and comfortable SKIN: skin color, texture, turgor are normal, no rashes or significant lesions EYES: normal, Conjunctiva are pink and non-injected, sclera clear  NECK: supple, thyroid normal size, non-tender, without nodularity LYMPH:  no palpable lymphadenopathy in the cervical, axillary  LUNGS: clear to auscultation and percussion with normal breathing effort HEART: regular  rate & rhythm and no murmurs and no lower extremity edema ABDOMEN:abdomen soft, non-tender and normal bowel sounds Musculoskeletal:no cyanosis of digits and no clubbing  NEURO: alert & oriented x 3 with fluent speech, no focal motor/sensory deficits BREAST: S/p Right mastectomy: Surgical incision healed well. No palpable mass, nodules or adenopathy bilaterally. Breast exam benign.  LABORATORY DATA:  I have reviewed the data as listed CBC Latest Ref Rng & Units 12/07/2019 10/16/2019 06/05/2019  WBC 4.0 - 10.5 K/uL 6.2 6.5 6.8  Hemoglobin 12.0 - 15.0 g/dL 11.7(L) 12.1 12.6  Hematocrit 36 - 46 % 36.7 37.6 37.9  Platelets 150 - 400 K/uL 245 298 333     CMP Latest Ref Rng & Units 12/07/2019 06/05/2019 11/28/2018  Glucose 70 - 99 mg/dL 122(H) 112(H) 98  BUN 6 - 20 mg/dL '13 15 12  '$ Creatinine 0.44 - 1.00 mg/dL 0.95 0.96 0.98  Sodium 135 - 145 mmol/L 139 142 142  Potassium 3.5 - 5.1 mmol/L 4.0 3.5 3.6  Chloride 98 - 111 mmol/L 104 103 102  CO2 22 - 32 mmol/L '27 28 27  '$ Calcium 8.9 - 10.3 mg/dL 10.4(H) 10.4(H) 10.8(H)  Total Protein 6.5 - 8.1 g/dL 7.5 8.2(H) 7.8  Total Bilirubin 0.3 - 1.2 mg/dL 0.3 0.4 0.6  Alkaline Phos 38 - 126 U/L 97 78 87  AST 15 - 41 U/L '18 24 26  '$ ALT 0 - 44 U/L '8 15 17      '$ RADIOGRAPHIC STUDIES: I have personally reviewed the radiological images as listed and agreed with the findings in the report. No results found.   ASSESSMENT & PLAN:  Melanie Moss is a 44 y.o. female with    1. 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 Treated with right mastectomy, radiation,adjuvant chemo and HER2 antibodies. -She started antiestrogen therapy withTamoxifenin 04/2016 andwas switched to Letrozole on 9/27/2019with ovarian suppression. She started Zoladex injectionsmonthly in 11/2017.    -She underwent BSO on 10/27/19. Her last Zoladex injection was 10/17/19.  -She is waiting to undergo breast reconstruction surgery for  right breast implant placement.  -She is clinically doing well. Lab reviewed, her CBC and CMP are within normal limits except Hg 11.7, BG 122, Ca 10/4. Her physical exam and her 10/2019 Left mammogram were unremarkable. There is no clinical concern for recurrence. -Continue surveillance. Next Mammogram in 10/2020  -Continue Letrozole  -f/u in 6 months    2. Iron deficient anemia, and anemia of chronic disease -Her anemia previously resolved.  -Her Hg today is 11.7, likely related to surgery. Ferritin 156, Iron 66, TIBC 254 (12/07/19). I do not plan to repeat iron level moving forward.   3. HTN -On medications. Continue to f/u with PCP -BP at 139/95 today (12/07/19)   4. Right arm lymphedema  -Currently uses compression cast on her right arm, and a pump from PT. -stable  5. Hot flashes, joint stiffness and pain -Secondary to AI -Joint pain is tolerable.  -She has tried Gabapentin and Celexa. Her Gyn recently started her on Paxil which helped her the most. She is fine to stop Celexa and if needed she can titrate up Paxil with her PCP    6. Bone health  -Her last bone density scan was many years ago.  -Now that she is postmenopausal and on AI which can weaken her bone, I recommend new baseline DEXA. She is agreeable.  -Will order DEXA to be done in a month.      Plan -She is clinically doing well -Continue Letrozole  -DEXA in a month -Lab and f/u in 6 months   No problem-specific Assessment & Plan notes found for this encounter.   Orders Placed This Encounter  Procedures  . DG Bone Density    Standing Status:   Future    Standing Expiration Date:   12/06/2020    Order Specific Question:   Reason for Exam (SYMPTOM  OR DIAGNOSIS REQUIRED)    Answer:   screening    Order Specific Question:   Is the patient pregnant?    Answer:   No     Order Specific Question:   Preferred imaging location?    Answer:   [facility]   All questions were answered. The patient knows to call the clinic with any problems, questions or concerns. No barriers to learning was detected.      Truitt Merle, MD 12/07/2019   I, Joslyn Devon, am acting as scribe for Truitt Merle, MD.   I have reviewed the above documentation for accuracy and completeness, and I agree with the above.

## 2019-12-07 ENCOUNTER — Encounter: Payer: Self-pay | Admitting: Hematology

## 2019-12-07 ENCOUNTER — Inpatient Hospital Stay: Payer: No Typology Code available for payment source | Attending: Hematology | Admitting: Hematology

## 2019-12-07 ENCOUNTER — Inpatient Hospital Stay: Payer: No Typology Code available for payment source

## 2019-12-07 ENCOUNTER — Other Ambulatory Visit: Payer: Self-pay

## 2019-12-07 VITALS — BP 139/95 | HR 86 | Temp 98.1°F | Resp 17 | Ht 63.0 in | Wt 155.0 lb

## 2019-12-07 DIAGNOSIS — N951 Menopausal and female climacteric states: Secondary | ICD-10-CM | POA: Insufficient documentation

## 2019-12-07 DIAGNOSIS — Z170 Estrogen receptor positive status [ER+]: Secondary | ICD-10-CM | POA: Diagnosis not present

## 2019-12-07 DIAGNOSIS — Z79811 Long term (current) use of aromatase inhibitors: Secondary | ICD-10-CM | POA: Insufficient documentation

## 2019-12-07 DIAGNOSIS — D500 Iron deficiency anemia secondary to blood loss (chronic): Secondary | ICD-10-CM

## 2019-12-07 DIAGNOSIS — I890 Lymphedema, not elsewhere classified: Secondary | ICD-10-CM | POA: Diagnosis not present

## 2019-12-07 DIAGNOSIS — E2839 Other primary ovarian failure: Secondary | ICD-10-CM

## 2019-12-07 DIAGNOSIS — D509 Iron deficiency anemia, unspecified: Secondary | ICD-10-CM | POA: Insufficient documentation

## 2019-12-07 DIAGNOSIS — I10 Essential (primary) hypertension: Secondary | ICD-10-CM | POA: Diagnosis not present

## 2019-12-07 DIAGNOSIS — C50511 Malignant neoplasm of lower-outer quadrant of right female breast: Secondary | ICD-10-CM

## 2019-12-07 LAB — IRON AND TIBC
Iron: 66 ug/dL (ref 41–142)
Saturation Ratios: 26 % (ref 21–57)
TIBC: 254 ug/dL (ref 236–444)
UIBC: 187 ug/dL (ref 120–384)

## 2019-12-07 LAB — CBC WITH DIFFERENTIAL/PLATELET
Abs Immature Granulocytes: 0.01 10*3/uL (ref 0.00–0.07)
Basophils Absolute: 0.1 10*3/uL (ref 0.0–0.1)
Basophils Relative: 1 %
Eosinophils Absolute: 0.4 10*3/uL (ref 0.0–0.5)
Eosinophils Relative: 6 %
HCT: 36.7 % (ref 36.0–46.0)
Hemoglobin: 11.7 g/dL — ABNORMAL LOW (ref 12.0–15.0)
Immature Granulocytes: 0 %
Lymphocytes Relative: 45 %
Lymphs Abs: 2.8 10*3/uL (ref 0.7–4.0)
MCH: 28.6 pg (ref 26.0–34.0)
MCHC: 31.9 g/dL (ref 30.0–36.0)
MCV: 89.7 fL (ref 80.0–100.0)
Monocytes Absolute: 0.3 10*3/uL (ref 0.1–1.0)
Monocytes Relative: 6 %
Neutro Abs: 2.6 10*3/uL (ref 1.7–7.7)
Neutrophils Relative %: 42 %
Platelets: 245 10*3/uL (ref 150–400)
RBC: 4.09 MIL/uL (ref 3.87–5.11)
RDW: 12.2 % (ref 11.5–15.5)
WBC: 6.2 10*3/uL (ref 4.0–10.5)
nRBC: 0 % (ref 0.0–0.2)

## 2019-12-07 LAB — COMPREHENSIVE METABOLIC PANEL
ALT: 8 U/L (ref 0–44)
AST: 18 U/L (ref 15–41)
Albumin: 4 g/dL (ref 3.5–5.0)
Alkaline Phosphatase: 97 U/L (ref 38–126)
Anion gap: 8 (ref 5–15)
BUN: 13 mg/dL (ref 6–20)
CO2: 27 mmol/L (ref 22–32)
Calcium: 10.4 mg/dL — ABNORMAL HIGH (ref 8.9–10.3)
Chloride: 104 mmol/L (ref 98–111)
Creatinine, Ser: 0.95 mg/dL (ref 0.44–1.00)
GFR calc Af Amer: >60 mL/min (ref >60)
GFR calc non Af Amer: >60 mL/min (ref >60)
Glucose, Bld: 122 mg/dL — ABNORMAL HIGH (ref 70–99)
Potassium: 4 mmol/L (ref 3.5–5.1)
Sodium: 139 mmol/L (ref 135–145)
Total Bilirubin: 0.3 mg/dL (ref 0.3–1.2)
Total Protein: 7.5 g/dL (ref 6.5–8.1)

## 2019-12-07 LAB — FERRITIN: Ferritin: 156 ng/mL (ref 11–307)

## 2019-12-08 ENCOUNTER — Telehealth: Payer: Self-pay | Admitting: Hematology

## 2019-12-08 NOTE — Telephone Encounter (Signed)
Scheduled per 8/16 los. Pt is aware of appt time and date. 

## 2019-12-16 ENCOUNTER — Other Ambulatory Visit: Payer: Self-pay

## 2019-12-16 ENCOUNTER — Ambulatory Visit
Admission: RE | Admit: 2019-12-16 | Discharge: 2019-12-16 | Disposition: A | Discharge status: Home / Self Care | Payer: No Typology Code available for payment source | Source: Ambulatory Visit | Attending: Hematology | Admitting: Hematology

## 2019-12-16 DIAGNOSIS — E2839 Other primary ovarian failure: Secondary | ICD-10-CM

## 2019-12-17 ENCOUNTER — Encounter: Payer: Self-pay | Admitting: Hematology

## 2020-01-06 ENCOUNTER — Other Ambulatory Visit: Payer: Self-pay

## 2020-01-06 ENCOUNTER — Other Ambulatory Visit: Payer: No Typology Code available for payment source

## 2020-01-06 DIAGNOSIS — Z20822 Contact with and (suspected) exposure to covid-19: Secondary | ICD-10-CM

## 2020-01-08 LAB — NOVEL CORONAVIRUS, NAA: SARS-CoV-2, NAA: NOT DETECTED

## 2020-01-08 LAB — SARS-COV-2, NAA 2 DAY TAT

## 2020-01-09 ENCOUNTER — Telehealth: Payer: Self-pay | Admitting: General Practice

## 2020-01-09 NOTE — Telephone Encounter (Signed)
Negative COVID results given. Patient results "NOT Detected." Caller expressed understanding. ° °

## 2020-02-10 ENCOUNTER — Other Ambulatory Visit: Payer: Self-pay

## 2020-02-10 DIAGNOSIS — N951 Menopausal and female climacteric states: Secondary | ICD-10-CM

## 2020-02-10 MED ORDER — PAROXETINE HCL ER 25 MG PO TB24: 25.0000 mg | ORAL_TABLET | Freq: Every day | ORAL | 12 refills | Status: DC

## 2020-04-15 ENCOUNTER — Other Ambulatory Visit: Payer: Self-pay | Admitting: Hematology

## 2020-04-15 DIAGNOSIS — D500 Iron deficiency anemia secondary to blood loss (chronic): Secondary | ICD-10-CM

## 2020-05-03 ENCOUNTER — Telehealth: Payer: Self-pay | Admitting: Emergency Medicine

## 2020-05-03 ENCOUNTER — Ambulatory Visit
Admission: EM | Admit: 2020-05-03 | Discharge: 2020-05-03 | Disposition: A | Discharge status: Home / Self Care | Payer: No Typology Code available for payment source | Attending: Internal Medicine | Admitting: Internal Medicine

## 2020-05-03 ENCOUNTER — Other Ambulatory Visit: Payer: Self-pay

## 2020-05-03 ENCOUNTER — Encounter: Payer: Self-pay | Admitting: Emergency Medicine

## 2020-05-03 DIAGNOSIS — Z20822 Contact with and (suspected) exposure to covid-19: Secondary | ICD-10-CM

## 2020-05-03 DIAGNOSIS — J209 Acute bronchitis, unspecified: Secondary | ICD-10-CM | POA: Diagnosis not present

## 2020-05-03 MED ORDER — PREDNISONE 20 MG PO TABS: 20.0000 mg | ORAL_TABLET | Freq: Every day | ORAL | 0 refills | Status: AC

## 2020-05-03 MED ORDER — ALBUTEROL SULFATE HFA 108 (90 BASE) MCG/ACT IN AERS: 1.0000 | INHALATION_SPRAY | Freq: Four times a day (QID) | RESPIRATORY_TRACT | 0 refills | Status: DC | PRN

## 2020-05-03 MED ORDER — MOUTHWASH COMPOUNDING BASE PO LIQD: 10.0000 mL | Freq: Four times a day (QID) | ORAL | 0 refills | Status: DC | PRN

## 2020-05-03 MED ORDER — BENZONATATE 100 MG PO CAPS
100.0000 mg | ORAL_CAPSULE | Freq: Three times a day (TID) | ORAL | 0 refills | Status: DC
Start: 2020-05-03 — End: 2020-05-03

## 2020-05-03 MED ORDER — PREDNISONE 20 MG PO TABS
20.0000 mg | ORAL_TABLET | Freq: Every day | ORAL | 0 refills | Status: DC
Start: 2020-05-03 — End: 2020-05-03

## 2020-05-03 MED ORDER — BENZONATATE 100 MG PO CAPS
100.0000 mg | ORAL_CAPSULE | Freq: Three times a day (TID) | ORAL | 0 refills | Status: DC
Start: 2020-05-03 — End: 2022-06-07

## 2020-05-03 NOTE — ED Provider Notes (Signed)
[facility]    CSN: [id number] Arrival date & time: 05/03/20  1534      History   Chief Complaint Chief Complaint  Patient presents with  . Cough    HPI Melanie Moss is a 45 y.o. female comes to the urgent care with complaints of 4-day history of cough, generalized body aches and nasal congestion.  Patient's symptoms started 4 days ago and has been persistent.  Cough is nonproductive of sputum.  It is associated with wheezing.  Patient is experiencing some hoarseness of her voice.  He has generalized body aches and nasal congestion.  No febrile episodes.  No chills. HPI  Past Medical History:  Diagnosis Date  . Anemia   . Anxiety   . Breast cancer of lower-outer quadrant of right female breast ([place]) 07/29/2015  . Family history of breast cancer   . Family history of colon cancer   . Family history of prostate cancer   . GERD (gastroesophageal reflux disease)    tums   . History of radiation therapy 03/07/16-04/27/16   right chest wall 45 Gy in 25 fractions, right mastectomy scar 16 Gy in 8 fractions  . Hyperlipidemia   . Hypertension   . Lymphedema   . Personal history of chemotherapy   . Personal history of radiation therapy   . Preterm labor     Patient Active Problem List   Diagnosis Date Noted  . Postoperative state 10/27/2019  . Post-operative state 10/27/2019  . Acquired absence of right breast 08/08/2017  . Genetic testing 08/16/2015  . Iron deficiency anemia due to chronic blood loss 08/03/2015  . Family history of breast cancer   . Family history of colon cancer   . Family history of prostate cancer   . Breast cancer of lower-outer quadrant of right female breast ([place]) 07/29/2015  . Leiomyoma of uterus, unspecified 08/01/2012  . Irregular menstrual cycle 08/01/2012  . 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  . HTN (hypertension) 12/22/2010  . Anemia 12/22/2010  . Acid reflux 12/22/2010    Past Surgical History:  Procedure Laterality Date  .  AXILLARY LYMPH NODE DISSECTION Right 09/29/2015   Procedure: AXILLARY LYMPH NODE DISSECTION;  Surgeon: Excell Seltzer, MD;  Location: [place];  Service: General;  Laterality: Right;  . BREAST IMPLANT REMOVAL Right 06/01/2017   Procedure: EXPLANT OF RIGHT 480 MENTOR BREAST IMPLANT;  Surgeon: Wallace Going, DO;  Location: [place];  Service: Plastics;  Laterality: Right;  . BREAST RECONSTRUCTION Right 08/08/2017   Procedure: RIGHT BREAST RECONSTRUCTION WITH LATISSMA MYOCUTANEOUS FLAP AND EXPANDER PLACEMENT;  Surgeon: Wallace Going, DO;  Location: [place];  Service: Plastics;  Laterality: Right;  . BREAST RECONSTRUCTION WITH PLACEMENT OF TISSUE EXPANDER AND FLEX HD (ACELLULAR HYDRATED DERMIS) Right 09/29/2015   Procedure: BREAST RECONSTRUCTION WITH PLACEMENT OF TISSUE EXPANDER AND FLEX HD (ACELLULAR HYDRATED DERMIS);  Surgeon: Loel Lofty Dillingham, DO;  Location: [place];  Service: Plastics;  Laterality: Right;  . CERVICAL CERCLAGE  02/19/2011   Procedure: CERCLAGE CERVICAL;  Surgeon: Agnes Lawrence, MD;  Location: [facility];  Service: Gynecology;  Laterality: N/A;  . DILATION AND CURETTAGE OF UTERUS    . INCISION AND DRAINAGE OF WOUND Right 04/19/2017   Procedure: IRRIGATION OF RIGHT BREAST POCKET;  Surgeon: Wallace Going, DO;  Location: [place];  Service: Plastics;  Laterality: Right;  . LATISSIMUS FLAP TO BREAST Right 08/08/2017   Procedure: LATISSIMUS FLAP TO BREAST;  Surgeon: Wallace Going, DO;  Location:  [facility];  Service: Plastics;  Laterality: Right;  . MASTECTOMY Right 2017   right 2017  . MASTECTOMY WITH AXILLARY LYMPH NODE DISSECTION Right 09/29/2015  . MASTOPEXY Left 03/11/2017   Procedure: MASTOPEXY FOR SYMENTRY;  Surgeon: Wallace Going, DO;  Location: [place];  Service: Plastics;  Laterality: Left;  . PORT-A-CATH REMOVAL  10/2016  . PORTACATH PLACEMENT N/A 09/29/2015   Procedure: INSERTION PORT-A-CATH;  Surgeon: Excell Seltzer, MD;  Location: [place];  Service: General;  Laterality: N/A;  . REMOVAL OF BILATERAL TISSUE EXPANDERS WITH PLACEMENT OF BILATERAL BREAST IMPLANTS Right 03/11/2017   Procedure: REMOVAL OF RIGHT TISSUE EXPANDERS WITH PLACEMRIGHT SILICONE  BREAST IMPLANTS;  Surgeon: Wallace Going, DO;  Location: [place];  Service: Plastics;  Laterality: Right;  . REMOVAL OF TISSUE EXPANDER AND PLACEMENT OF IMPLANT Right 10/19/2017   Procedure: REMOVAL OF TISSUE EXPANDER;  Surgeon: Wallace Going, DO;  Location: [place];  Service: Plastics;  Laterality: Right;  . ROBOTIC ASSISTED TOTAL HYSTERECTOMY WITH BILATERAL SALPINGO OOPHERECTOMY Bilateral 10/27/2019   Procedure: XI ROBOTIC ASSISTED TOTAL HYSTERECTOMY WITH BILATERAL SALPINGO OOPHORECTOMY;  Surgeon: Lavonia Drafts, MD;  Location: [place];  Service: Gynecology;  Laterality: Bilateral;  . SIMPLE MASTECTOMY WITH AXILLARY SENTINEL NODE BIOPSY Right 09/29/2015   Procedure: RIGHT TOTAL  MASTECTOMY WITH AXILLARY SENTINEL NODE BIOPSY;  Surgeon: Excell Seltzer, MD;  Location: [place];  Service: General;  Laterality: Right;    OB History    Gravida  6   Para  4   Term  0   Preterm  4   AB  1   Living  1     SAB  1   IAB  0   Ectopic  0   Multiple  0   Live Births  1            Home Medications    Prior to Admission medications   Medication Sig Start Date End Date Taking? Authorizing Provider  albuterol (VENTOLIN HFA) 108 (90 Base) MCG/ACT inhaler Inhale 1-2 puffs into the lungs every 6 (six) hours as needed for wheezing or shortness of breath. 05/03/20  Yes Rakiyah Esch, Myrene Galas, MD  benzonatate (TESSALON) 100 MG capsule Take 1 capsule (100 mg total) by mouth every 8 (eight) hours. 05/03/20  Yes Shep Porter, Myrene Galas, MD  Mouthwash Compounding Base LIQD Swish and spit 10 mLs every 6 (six) hours as needed. Maalox;80 ml: Lidocaine 2%; 80 ml: Benadryl 12.5mg /ml; 80 ml 05/03/20  Yes Tristain Daily, Myrene Galas,  MD  predniSONE (DELTASONE) 20 MG tablet Take 1 tablet (20 mg total) by mouth daily for 5 days. 05/03/20 05/08/20 Yes Belynda Pagaduan, Myrene Galas, MD  B Complex-C (SUPER B COMPLEX PO) Take 1 tablet by mouth daily.    [provider]  citalopram (CELEXA) 20 MG tablet Take 20 mg by mouth daily.    [provider]  ezetimibe (ZETIA) 10 MG tablet Take 10 mg by mouth at bedtime. 05/26/19   [provider]  irbesartan-hydrochlorothiazide (AVALIDE) 150-12.5 MG tablet Take 1 tablet by mouth daily.    [provider]  letrozole (FEMARA) 2.5 MG tablet TAKE 1 TABLET BY MOUTH EVERY DAY 04/18/20   Alla Feeling, NP  PARoxetine (PAXIL CR) 25 MG 24 hr tablet Take 1 tablet (25 mg total) by mouth daily. 02/10/20   Lavonia Drafts, MD  Vitamin D3 (VITAMIN D) 25 MCG tablet Take 1,000 Units by mouth daily.    [provider]    Family History Family History  Problem Relation Age of Onset  . Hypertension Mother   . Hypertension Father   . Prostate cancer Father 31  . Breast cancer Maternal Aunt 58  . Breast cancer Paternal Aunt 32  . Cirrhosis Maternal Uncle   . Prostate cancer Paternal Uncle   . Throat cancer Maternal Aunt        non smoker  . Prostate cancer Paternal Uncle   . Colon cancer Cousin        maternal first cousin dx in his 68s  . Lung cancer Cousin        smoker    Social History Social History   Tobacco Use  . Smoking status: Never Smoker  . Smokeless tobacco: Never Used  Vaping Use  . Vaping Use: Never used  Substance Use Topics  . Alcohol use: No  . Drug use: No     Allergies   Patient has no known allergies.   Review of Systems Review of Systems  Constitutional: Positive for fatigue. Negative for chills and fever.  HENT: Positive for congestion. Negative for postnasal drip and rhinorrhea.   Respiratory: Positive for cough and wheezing. Negative for shortness of breath.   Cardiovascular: Negative for chest pain.   Gastrointestinal: Negative for nausea and vomiting.  Musculoskeletal: Positive for myalgias. Negative for joint swelling, neck pain and neck stiffness.  Skin: Negative.   Neurological: Negative for dizziness, numbness and headaches.     Physical Exam Triage Vital Signs ED Triage Vitals  Enc Vitals Group     BP 05/03/20 1659 (!) 136/93     Pulse Rate 05/03/20 1659 87     Resp 05/03/20 1659 18     Temp 05/03/20 1659 97.9 F (36.6 C)     Temp Source 05/03/20 1659 Oral     SpO2 05/03/20 1659 97 %     Weight --      Height --      Head Circumference --      Peak Flow --      Pain Score 05/03/20 1710 7     Pain Loc --      Pain Edu? --      Excl. in [place]? --    No data found.  Updated Vital Signs BP (!) 136/93   Pulse 87   Temp 97.9 F (36.6 C) (Oral)   Resp 18   LMP 04/23/2018 Comment: Hysterectomy 10/28/19  SpO2 97%   Visual Acuity Right Eye Distance:   Left Eye Distance:   Bilateral Distance:    Right Eye Near:   Left Eye Near:    Bilateral Near:     Physical Exam   UC Treatments / Results  Labs (all labs ordered are listed, but only abnormal results are displayed) Labs Reviewed  NOVEL CORONAVIRUS, NAA    EKG   Radiology No results found.  Procedures Procedures (including critical care time)  Medications Ordered in UC Medications - No data to display  Initial Impression / Assessment and Plan / UC Course  I have reviewed the triage vital signs and the nursing notes.  Pertinent labs & imaging results that were available during my care of the patient were reviewed by me and considered in my medical decision making (see chart for details).     1.  Acute laryngotracheobronchitis: Albuterol as needed for shortness of breath Tessalon Perles as needed for cough Prednisone 20 mg orally daily for 5 days COVID-19 PCR test  has been sent Please quarantine until COVID-19 test results are available We will call you with results if abnormal Please return  to the urgent care if your symptoms worsen. Final Clinical Impressions(s) / UC Diagnoses   Final diagnoses:  Encounter for screening laboratory testing for COVID-19 virus  Acute laryngotracheobronchitis     Discharge Instructions     Please take medications as directed If symptoms worsen please return to the urgent care to be reevaluated. If you develop worsening shortness of breath, chest tightness, fever or chills please return to the urgent care to be evaluated.   ED Prescriptions    Medication Sig Dispense Auth. Provider   benzonatate (TESSALON) 100 MG capsule Take 1 capsule (100 mg total) by mouth every 8 (eight) hours. 21 capsule Steven Basso, Myrene Galas, MD   albuterol (VENTOLIN HFA) 108 (90 Base) MCG/ACT inhaler Inhale 1-2 puffs into the lungs every 6 (six) hours as needed for wheezing or shortness of breath. 18 g Gaston Dase, Myrene Galas, MD   predniSONE (DELTASONE) 20 MG tablet Take 1 tablet (20 mg total) by mouth daily for 5 days. 5 tablet Elyce Zollinger, Myrene Galas, MD   Mouthwash Compounding Base LIQD Swish and spit 10 mLs every 6 (six) hours as needed. Maalox;80 ml: Lidocaine 2%; 80 ml: Benadryl 12.5mg /ml; 80 ml 240 mL Tekila Caillouet, Myrene Galas, MD     PDMP not reviewed this encounter.   Chase Picket, MD 05/03/20 [phone]

## 2020-05-03 NOTE — Discharge Instructions (Signed)
Please take medications as directed If symptoms worsen please return to the urgent care to be reevaluated. If you develop worsening shortness of breath, chest tightness, fever or chills please return to the urgent care to be evaluated.

## 2020-05-03 NOTE — ED Triage Notes (Signed)
Pt sts cough x 4 days with body aches and congestion

## 2020-05-04 LAB — SARS-COV-2, NAA 2 DAY TAT

## 2020-05-04 LAB — NOVEL CORONAVIRUS, NAA: SARS-CoV-2, NAA: NOT DETECTED

## 2020-06-08 NOTE — Progress Notes (Signed)
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 breast [facility])   Staging form: Breast, AJCC 7th Edition     Clinical stage from 08/03/2015: Stage IA (T1c, N0, M0) - Unsigned     Pathologic stage from 09/29/2015: Stage IIIA (T3(m), N1a, cM0) - Signed by Truitt Merle, MD on 10/17/2015       Breast cancer of lower-outer quadrant of right female breast ([place])  07/25/2015 Mammogram   diagnostic mammogram and ultrasound showed a 11 mm mass in the 8:00 position of the right breast, small irregular lesion in the retroareolar regio of the right breast, contiguous with the dermis   07/26/2015 Initial Diagnosis   Breast cancer of lower-outer quadrant of right female breast ([place])   07/26/2015 Initial Biopsy   right breast needle core biopsy at 8:00 position (1), invasive ductal carcinoma, grade 3, 6:30 o'clock position invasive (2) and in situ ductal carcinoma, grade 1-2, lymphovascular invasion is identified   07/26/2015 Receptors her2   (1) ER 95% positive, PR 95% positive, Ki-67 30%, HER-2 positive with ratio 2.04, copy # 5.20, (2) ER 90% positive, PR 95% positive, Ki-67 10%, HER-2 negative.   08/02/2015 Imaging   breast MRI showed extensive mass  and non-mass enhancement in the lower outer quadrant of the right breast  spanning 13 cm, focal enhancement within the right nipple, at least 3-4 abnormal appearing right axillary lymph  node.   09/29/2015 Surgery   right mastectomy and axillar node dissection    09/29/2015 Pathology Results   Right mastectomy and axillary node dissection showed multifocal mammary carcinoma with ductal and lobular features, tumor size 13 cm, 2.1 cm, 1.9 cm, and 0.5 cm, grade 2-3, anterior margins were positive, 2 nodes with metastasis and one with isolated cells   11/01/2015 - 02/14/2016 Chemotherapy   TCHP every 3 weeks, for 6 cycles, followed by herceptin maintenance therapy for a total of one year    03/06/2016 - 11/01/2016 Antibody Plan   Maintenance Herceptin and perjeta, every 3 weeks   03/07/2016 - 04/27/2016 Radiation Therapy   03/07/16-04/27/16 Site/dose: 1) 71fld right chest wall/ 45 Gy in 25 fractions 2) Right mastectomy scar / 16 Gy in 8 fractions   05/21/2016 -  Anti-estrogen oral therapy   Tamoxifen 20 mg daily starting 05/21/16 Switched to Letrozole 2.$RemoveBefore'5mg'zsjFMrAulPxoY$  daily on 01/17/18.  Started monthyl Zolodex injections on 12/16/17. Last on 10/17/19 as she is s/p BSO on 10/27/19.    10/17/2016 Mammogram   IMPRESSION: No mammographic evidence of malignancy. A result letter of this screening mammogram will be mailed directly to the patient.   10/22/2016 Echocardiogram   Impressions:  - Normal study. Stable GLPSS at -21%. LVEF 60-65%.   11/09/2016 - 11/09/2017 Anti-estrogen oral therapy    Neratinib $RemoveBe'240mg'PmkYzwwDx$  daily started on 11/09/2016. Completed in one year   02/04/2017 Survivorship   Survivorship Clinic with Gardenia Phlegm, NP   03/11/2017 Surgery   REMOVAL OF RIGHT TISSUE EXPANDERS WITH PLACEMRIGHT SILICONE  BREAST IMPLANTS and left  MASTOPEXY FOR SYMENTRY by Dr. Marla Roe  03/11/17   03/11/2017 Pathology Results   Diagnosis 03/11/17 1. Breast, capsule, right inferior BENIGN FIBROVASCULAR TISSUE 2. Breast, Mammoplasty, left BENIGN BREAST TISSUE NEGATIVE FOR ATYPIA OR MALIGNANCY   10/27/2019 Surgery   XI ROBOTIC ASSISTED TOTAL HYSTERECTOMY WITH BILATERAL SALPINGO OOPHORECTOMY  by Dr Ihor Dow and Dr Harolyn Rutherford   FINAL MICROSCOPIC DIAGNOSIS:   A. UTERUS, CERVIX, BILATERAL TUBES AND OVARIES:  - Uterus with leiomyomata, largest measuring 2.2 cm  - Benign inactive endometrium  - Adenomyosis  - Benign unremarkable cervix  - Benign unremarkable bilateral fallopian tubes and ovaries  - No evidence of malignancy       CURRENT THERAPY:  Letrozole2.5mg  dailystartedon 9/27//2019  INTERVAL HISTORY:  Melanie Moss is here for a follow up of right breast cancer. She was last seen by me 6 months ago. She presents to the clinic alone. She notes she is doing well. She denies any new major changes. She is on Letrozole and tolerating well. She notes no exacerbation of her baseline joint stiffness. She does have hot flashes. She has been taking Paxil at half tablet for her hot flashes by Gyn. She notes her anxiety has worsened. I reviewed her medication list. She notes at home her BP is mostly normal.     REVIEW OF SYSTEMS:   Constitutional: Denies fevers, chills or abnormal weight loss (+) Hot flashes  Eyes: Denies blurriness of vision Ears, nose, mouth, throat, and face: Denies mucositis or sore throat Respiratory: Denies cough, dyspnea or wheezes Cardiovascular: Denies palpitation, chest discomfort or lower extremity swelling Gastrointestinal:  Denies nausea, heartburn or change in bowel habits Skin: Denies abnormal skin rashes MSK: (+) Stable joint stiffness  Lymphatics: Denies new lymphadenopathy or easy bruising Neurological:Denies numbness, tingling or new weaknesses Behavioral/Psych: Mood is stable, no new changes (+) Increased anxiety.  All other systems were reviewed with the patient and are negative.  MEDICAL HISTORY:  Past Medical History:  Diagnosis Date  . Anemia   . Anxiety   . Breast cancer of lower-outer quadrant of right female breast ([place]) 07/29/2015  . Family history of breast cancer   . Family history of colon cancer   . Family history of  prostate cancer   . GERD (gastroesophageal reflux disease)    tums   . History of radiation therapy 03/07/16-04/27/16   right chest wall 45 Gy in 25 fractions, right mastectomy scar 16 Gy in 8 fractions  . Hyperlipidemia   . Hypertension   . Lymphedema   . Personal history of chemotherapy   . Personal history of radiation therapy   . Preterm labor     SURGICAL HISTORY: Past Surgical History:  Procedure Laterality Date  . AXILLARY LYMPH NODE DISSECTION Right 09/29/2015   Procedure: AXILLARY LYMPH NODE DISSECTION;  Surgeon: Excell Seltzer, MD;  Location: [place];  Service: General;  Laterality: Right;  . BREAST IMPLANT REMOVAL Right 06/01/2017   Procedure: EXPLANT OF RIGHT 480 MENTOR BREAST IMPLANT;  Surgeon: Wallace Going, DO;  Location: [place];  Service: Plastics;  Laterality: Right;  . BREAST RECONSTRUCTION Right 08/08/2017   Procedure: RIGHT BREAST RECONSTRUCTION WITH LATISSMA MYOCUTANEOUS FLAP AND EXPANDER PLACEMENT;  Surgeon: Wallace Going, DO;  Location: [place];  Service: Plastics;  Laterality: Right;  . BREAST RECONSTRUCTION WITH PLACEMENT OF TISSUE EXPANDER AND FLEX HD (ACELLULAR HYDRATED DERMIS) Right 09/29/2015   Procedure: BREAST RECONSTRUCTION WITH PLACEMENT OF TISSUE EXPANDER AND FLEX HD (ACELLULAR HYDRATED DERMIS);  Surgeon: Loel Lofty  Dillingham, DO;  Location: [place];  Service: Plastics;  Laterality: Right;  . CERVICAL CERCLAGE  02/19/2011   Procedure: CERCLAGE CERVICAL;  Surgeon: Agnes [place], MD;  Location: [facility];  Service: Gynecology;  Laterality: N/A;  . DILATION AND CURETTAGE OF UTERUS    . INCISION AND DRAINAGE OF WOUND Right 04/19/2017   Procedure: IRRIGATION OF RIGHT BREAST POCKET;  Surgeon: Wallace Going, DO;  Location: [place];  Service: Plastics;  Laterality: Right;  . LATISSIMUS FLAP TO BREAST Right 08/08/2017   Procedure: LATISSIMUS FLAP TO BREAST;  Surgeon: Wallace Going, DO;  Location: [place];  Service: Plastics;   Laterality: Right;  . MASTECTOMY Right 2017   right 2017  . MASTECTOMY WITH AXILLARY LYMPH NODE DISSECTION Right 09/29/2015  . MASTOPEXY Left 03/11/2017   Procedure: MASTOPEXY FOR SYMENTRY;  Surgeon: Wallace Going, DO;  Location: [place];  Service: Plastics;  Laterality: Left;  . PORT-A-CATH REMOVAL  10/2016  . PORTACATH PLACEMENT N/A 09/29/2015   Procedure: INSERTION PORT-A-CATH;  Surgeon: Excell Seltzer, MD;  Location: [place];  Service: General;  Laterality: N/A;  . REMOVAL OF BILATERAL TISSUE EXPANDERS WITH PLACEMENT OF BILATERAL BREAST IMPLANTS Right 03/11/2017   Procedure: REMOVAL OF RIGHT TISSUE EXPANDERS WITH PLACEMRIGHT SILICONE  BREAST IMPLANTS;  Surgeon: Wallace Going, DO;  Location: [place];  Service: Plastics;  Laterality: Right;  . REMOVAL OF TISSUE EXPANDER AND PLACEMENT OF IMPLANT Right 10/19/2017   Procedure: REMOVAL OF TISSUE EXPANDER;  Surgeon: Wallace Going, DO;  Location: [place];  Service: Plastics;  Laterality: Right;  . ROBOTIC ASSISTED TOTAL HYSTERECTOMY WITH BILATERAL SALPINGO OOPHERECTOMY Bilateral 10/27/2019   Procedure: XI ROBOTIC ASSISTED TOTAL HYSTERECTOMY WITH BILATERAL SALPINGO OOPHORECTOMY;  Surgeon: Lavonia Drafts, MD;  Location: [place];  Service: Gynecology;  Laterality: Bilateral;  . SIMPLE MASTECTOMY WITH AXILLARY SENTINEL NODE BIOPSY Right 09/29/2015   Procedure: RIGHT TOTAL  MASTECTOMY WITH AXILLARY SENTINEL NODE BIOPSY;  Surgeon: Excell Seltzer, MD;  Location: [place];  Service: General;  Laterality: Right;    I have reviewed the social history and family history with the patient and they are unchanged from previous note.  ALLERGIES:  has No Known Allergies.  MEDICATIONS:  Current Outpatient Medications  Medication Sig Dispense Refill  . B Complex-C (SUPER B COMPLEX PO) Take 1 tablet by mouth daily.    Marland Kitchen letrozole (FEMARA) 2.5 MG tablet TAKE 1 TABLET BY MOUTH EVERY DAY 90  tablet 3  . PARoxetine (PAXIL CR) 25 MG 24 hr tablet Take 1 tablet (25 mg total) by mouth daily. 90 tablet 12  . potassium chloride (KLOR-CON) 10 MEQ tablet Take 1 tablet (10 mEq total) by mouth 2 (two) times daily. 60 tablet 0  . albuterol (VENTOLIN HFA) 108 (90 Base) MCG/ACT inhaler Inhale 1-2 puffs into the lungs every 6 (six) hours as needed for wheezing or shortness of breath. (Patient not taking: Reported on 06/10/2020) 18 g 0  . benzonatate (TESSALON) 100 MG capsule Take 1 capsule (100 mg total) by mouth every 8 (eight) hours. (Patient not taking: Reported on 06/10/2020) 21 capsule 0  . ezetimibe (ZETIA) 10 MG tablet Take 10 mg by mouth at bedtime. (Patient not taking: Reported on 06/10/2020)    . irbesartan-hydrochlorothiazide (AVALIDE) 150-12.5 MG tablet Take 1 tablet by mouth daily. (Patient not taking: Reported on 06/10/2020)    . Mouthwash Compounding Base LIQD Swish and spit 10 mLs every 6 (six) hours as needed.  Maalox;80 ml: Lidocaine 2%; 80 ml: Benadryl 12.$RemoveBeforeD'5mg'xFbnsdLmMShcfx$ /ml; 80 ml (Patient not taking: Reported on 06/10/2020) 240 mL 0  . Vitamin D3 (VITAMIN D) 25 MCG tablet Take 1,000 Units by mouth daily. (Patient not taking: Reported on 06/10/2020)     No current facility-administered medications for this visit.   Facility-Administered Medications Ordered in Other Visits  Medication Dose Route Frequency Provider Last Rate Last Admin  . 0.9 %  sodium chloride infusion   Intravenous Once Truitt Merle, MD      . alteplase (CATHFLO ACTIVASE) injection 2 mg  2 mg Intracatheter Once PRN Truitt Merle, MD      . heparin lock flush 100 unit/mL  250 Units Intracatheter Once PRN Truitt Merle, MD      . heparin lock flush 100 unit/mL  500 Units Intravenous Once PRN Truitt Merle, MD      . sodium chloride 0.9 % injection 10 mL  10 mL Intracatheter PRN Truitt Merle, MD      . sodium chloride 0.9 % injection 10 mL  10 mL Intravenous PRN Truitt Merle, MD      . sodium chloride 0.9 % injection 3 mL  3 mL Intravenous Once PRN Truitt Merle, MD      . sodium chloride flush (NS) 0.9 % injection 10 mL  10 mL Intravenous PRN Truitt Merle, MD        PHYSICAL EXAMINATION: ECOG PERFORMANCE STATUS: 0 - Asymptomatic  Vitals:   06/10/20 0907  BP: (!) 139/100  Pulse: 78  Resp: 18  Temp: 98.2 F (36.8 C)  SpO2: 99%   Filed Weights   06/10/20 0907  Weight: 161 lb 4.8 oz (73.2 kg)    GENERAL:alert, no distress and comfortable SKIN: skin color, texture, turgor are normal, no rashes or significant lesions EYES: normal, Conjunctiva are pink and non-injected, sclera clear  NECK: supple, thyroid normal size, non-tender, without nodularity LYMPH:  no palpable lymphadenopathy in the cervical, axillary  LUNGS: clear to auscultation and percussion with normal breathing effort HEART: regular rate & rhythm and no murmurs and no lower extremity edema ABDOMEN:abdomen soft, non-tender and normal bowel sounds Musculoskeletal:no cyanosis of digits and no clubbing  NEURO: alert & oriented x 3 with fluent speech, no focal motor/sensory deficits BREAST: S/p right mastectomy: Surgical incision healed well (+) Skin hyperpigmentation of right breast from RT. (+) Mild right arm lymphedema. No palpable mass, nodules or adenopathy bilaterally. Breast exam benign.   LABORATORY DATA:  I have reviewed the data as listed CBC Latest Ref Rng & Units 06/10/2020 12/07/2019 10/16/2019  WBC 4.0 - 10.5 K/uL 6.8 6.2 6.5  Hemoglobin 12.0 - 15.0 g/dL 11.9(L) 11.7(L) 12.1  Hematocrit 36.0 - 46.0 % 36.4 36.7 37.6  Platelets 150 - 400 K/uL 289 245 298     CMP Latest Ref Rng & Units 06/10/2020 12/07/2019 06/05/2019  Glucose 70 - 99 mg/dL 141(H) 122(H) 112(H)  BUN 6 - 20 mg/dL $Remove'13 13 15  'XIikpVB$ Creatinine 0.44 - 1.00 mg/dL 1.10(H) 0.95 0.96  Sodium 135 - 145 mmol/L 140 139 142  Potassium 3.5 - 5.1 mmol/L 3.0(L) 4.0 3.5  Chloride 98 - 111 mmol/L 103 104 103  CO2 22 - 32 mmol/L $RemoveB'26 27 28  'smVUfiGl$ Calcium 8.9 - 10.3 mg/dL 9.7 10.4(H) 10.4(H)  Total Protein 6.5 - 8.1 g/dL 7.7 7.5  8.2(H)  Total Bilirubin 0.3 - 1.2 mg/dL 0.5 0.3 0.4  Alkaline Phos 38 - 126 U/L 87 97 78  AST 15 - 41 U/L 25 18  24  ALT 0 - 44 U/L $Remo'15 8 15      'ZubeP$ RADIOGRAPHIC STUDIES: I have personally reviewed the radiological images as listed and agreed with the findings in the report. No results found.   ASSESSMENT & PLAN:  CALINDA STOCKINGER is a 45 y.o. female with    1. 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 Treated with right mastectomy, radiation,adjuvant chemo and HER2 antibodies. -She started antiestrogen therapy withTamoxifenin 04/2016 andwas switched to Letrozole on 9/27/2019with ovarian suppression. She started Zoladex injectionsmonthly in 11/2017.  -She underwent BSO on 10/27/19. Her last Zoladex injection was 10/17/19.  -She is waiting to undergo breast reconstruction surgery for right breast implant placement.  -She is clinically doing well. Lab reviewed, her CBC and CMP are within normal limits except K 3, BG 141, Cr 1.1. Her physical exam and her 10/2019 mammogram were unremarkable. There is no clinical concern for recurrence. -Continue surveillance. Next Mammogram in 10/2020 -Continue Letrozole. She continues to tolerate with stable baseline joint stiffness and hot flashes.  -F/u in 6 months    2. Iron deficient anemia, and anemia of chronic disease -Her anemia previously resolved. Hg has been intermittently low. I do not plan to repeat iron level moving forward.   3. HTN, new Hyperglycemia, Weight gain   -On medications. Continue to f/u with PCP -Her BP is elevated in clinic today (139/100), but usually normal at home. I encouraged her to continue to monitor at home.  -Her BG is 141 with only 1 cup of tea today (06/10/20). She also has gained weight in the past 6 months. I recommend she  manage her diet with low carb and no sweets or soda and increase her exercise. I also recommend she check her A1c with her PCP in 06/2020. She is agreeable.   4. Right arm lymphedema  -Mild now. Currently compression cast on her right arm, and a pump from PT.  5. Hot flashes, joint stiffness and pain -Secondary to AI and BSO -Joint pain is tolerable.  -She has tried Gabapentin and Celexa. Her Gyn recently started her on Paxil which helped her the most, but she notes increased anxiety on 25 mg dose. I recommend she contact Gyn about reducing to $RemoveBef'12mg'ECMcTFINhQ$ . She is agreeable.   6. Osteopenia -Her last bone density scan was many years ago. Her latest DEXA from 11/2019 showed osteopenia with lowest T-score -1.1 at right femur neck.    7. Hypokalemia  -K at 3 today (06/10/20). I called in oral potassium 66meq to take BID for 1 week then once daily until completion. I also recommend she increase potassium in diet.    Plan -Continue Letrozole  -Mammogram in 10/2020  -Lab and f/u in 6 months -I called in KCL pills  -copy note to her PCP about her hyperglycemia and hypokalemia    No problem-specific Assessment & Plan notes found for this encounter.   Orders Placed This Encounter  Procedures  . MM Digital Screening Unilat L    Standing Status:   Future    Standing Expiration Date:   06/10/2021    Order Specific Question:   Reason for Exam (SYMPTOM  OR DIAGNOSIS REQUIRED)    Answer:   screening    Order Specific Question:   Is the patient pregnant?    Answer:   No    Order Specific Question:  Preferred imaging location?    Answer:   [facility]   All questions were answered. The patient knows to call the clinic with any problems, questions or concerns. No barriers to learning was detected. The total time spent in the appointment was 30 minutes.     Truitt Merle, MD 06/10/2020   I, Joslyn Devon, am acting as scribe for Truitt Merle, MD.   I have reviewed the above documentation for  accuracy and completeness, and I agree with the above.

## 2020-06-10 ENCOUNTER — Encounter: Payer: Self-pay | Admitting: Hematology

## 2020-06-10 ENCOUNTER — Other Ambulatory Visit: Payer: Self-pay

## 2020-06-10 ENCOUNTER — Telehealth: Payer: Self-pay | Admitting: Hematology

## 2020-06-10 ENCOUNTER — Inpatient Hospital Stay: Payer: No Typology Code available for payment source

## 2020-06-10 ENCOUNTER — Inpatient Hospital Stay: Payer: No Typology Code available for payment source | Attending: Hematology | Admitting: Hematology

## 2020-06-10 VITALS — BP 139/100 | HR 78 | Temp 98.2°F | Resp 18 | Ht 63.0 in | Wt 161.3 lb

## 2020-06-10 DIAGNOSIS — M85851 Other specified disorders of bone density and structure, right thigh: Secondary | ICD-10-CM | POA: Diagnosis not present

## 2020-06-10 DIAGNOSIS — I890 Lymphedema, not elsewhere classified: Secondary | ICD-10-CM | POA: Diagnosis not present

## 2020-06-10 DIAGNOSIS — D509 Iron deficiency anemia, unspecified: Secondary | ICD-10-CM | POA: Insufficient documentation

## 2020-06-10 DIAGNOSIS — E876 Hypokalemia: Secondary | ICD-10-CM | POA: Insufficient documentation

## 2020-06-10 DIAGNOSIS — Z79811 Long term (current) use of aromatase inhibitors: Secondary | ICD-10-CM | POA: Diagnosis not present

## 2020-06-10 DIAGNOSIS — R739 Hyperglycemia, unspecified: Secondary | ICD-10-CM | POA: Diagnosis not present

## 2020-06-10 DIAGNOSIS — I10 Essential (primary) hypertension: Secondary | ICD-10-CM | POA: Insufficient documentation

## 2020-06-10 DIAGNOSIS — C50511 Malignant neoplasm of lower-outer quadrant of right female breast: Secondary | ICD-10-CM

## 2020-06-10 DIAGNOSIS — Z170 Estrogen receptor positive status [ER+]: Secondary | ICD-10-CM

## 2020-06-10 LAB — CBC WITH DIFFERENTIAL/PLATELET
Abs Immature Granulocytes: 0.01 10*3/uL (ref 0.00–0.07)
Basophils Absolute: 0 10*3/uL (ref 0.0–0.1)
Basophils Relative: 0 %
Eosinophils Absolute: 0.2 10*3/uL (ref 0.0–0.5)
Eosinophils Relative: 3 %
HCT: 36.4 % (ref 36.0–46.0)
Hemoglobin: 11.9 g/dL — ABNORMAL LOW (ref 12.0–15.0)
Immature Granulocytes: 0 %
Lymphocytes Relative: 45 %
Lymphs Abs: 3.1 10*3/uL (ref 0.7–4.0)
MCH: 28.6 pg (ref 26.0–34.0)
MCHC: 32.7 g/dL (ref 30.0–36.0)
MCV: 87.5 fL (ref 80.0–100.0)
Monocytes Absolute: 0.4 10*3/uL (ref 0.1–1.0)
Monocytes Relative: 5 %
Neutro Abs: 3.1 10*3/uL (ref 1.7–7.7)
Neutrophils Relative %: 47 %
Platelets: 289 10*3/uL (ref 150–400)
RBC: 4.16 MIL/uL (ref 3.87–5.11)
RDW: 12.9 % (ref 11.5–15.5)
WBC: 6.8 10*3/uL (ref 4.0–10.5)
nRBC: 0 % (ref 0.0–0.2)

## 2020-06-10 LAB — COMPREHENSIVE METABOLIC PANEL
ALT: 15 U/L (ref 0–44)
AST: 25 U/L (ref 15–41)
Albumin: 4.3 g/dL (ref 3.5–5.0)
Alkaline Phosphatase: 87 U/L (ref 38–126)
Anion gap: 11 (ref 5–15)
BUN: 13 mg/dL (ref 6–20)
CO2: 26 mmol/L (ref 22–32)
Calcium: 9.7 mg/dL (ref 8.9–10.3)
Chloride: 103 mmol/L (ref 98–111)
Creatinine, Ser: 1.1 mg/dL — ABNORMAL HIGH (ref 0.44–1.00)
GFR, Estimated: >60 mL/min (ref >60)
Glucose, Bld: 141 mg/dL — ABNORMAL HIGH (ref 70–99)
Potassium: 3 mmol/L — ABNORMAL LOW (ref 3.5–5.1)
Sodium: 140 mmol/L (ref 135–145)
Total Bilirubin: 0.5 mg/dL (ref 0.3–1.2)
Total Protein: 7.7 g/dL (ref 6.5–8.1)

## 2020-06-10 MED ORDER — POTASSIUM CHLORIDE ER 10 MEQ PO TBCR: 10.0000 meq | EXTENDED_RELEASE_TABLET | Freq: Two times a day (BID) | ORAL | 0 refills | Status: DC

## 2020-06-10 NOTE — Telephone Encounter (Signed)
Scheduled per 02/18 los, patient is aware. Patient will be notified.

## 2020-06-30 ENCOUNTER — Other Ambulatory Visit: Payer: Self-pay | Admitting: Hematology

## 2020-08-01 ENCOUNTER — Other Ambulatory Visit: Payer: Self-pay | Admitting: Hematology

## 2020-11-18 ENCOUNTER — Encounter: Payer: Self-pay | Admitting: Hematology

## 2020-11-18 ENCOUNTER — Other Ambulatory Visit: Payer: Self-pay

## 2020-11-18 ENCOUNTER — Ambulatory Visit
Admission: RE | Admit: 2020-11-18 | Discharge: 2020-11-18 | Disposition: A | Discharge status: Home / Self Care | Payer: No Typology Code available for payment source | Source: Ambulatory Visit | Attending: Hematology | Admitting: Hematology

## 2020-11-18 DIAGNOSIS — C50511 Malignant neoplasm of lower-outer quadrant of right female breast: Secondary | ICD-10-CM

## 2020-11-18 DIAGNOSIS — Z170 Estrogen receptor positive status [ER+]: Secondary | ICD-10-CM

## 2020-12-09 ENCOUNTER — Other Ambulatory Visit: Payer: Self-pay

## 2020-12-09 ENCOUNTER — Inpatient Hospital Stay: Payer: No Typology Code available for payment source | Attending: Hematology

## 2020-12-09 ENCOUNTER — Inpatient Hospital Stay (HOSPITAL_BASED_OUTPATIENT_CLINIC_OR_DEPARTMENT_OTHER): Payer: No Typology Code available for payment source | Admitting: Hematology

## 2020-12-09 ENCOUNTER — Telehealth: Payer: Self-pay | Admitting: *Deleted

## 2020-12-09 VITALS — BP 111/81 | HR 78 | Temp 99.6°F | Resp 20 | Wt 155.1 lb

## 2020-12-09 DIAGNOSIS — C50511 Malignant neoplasm of lower-outer quadrant of right female breast: Secondary | ICD-10-CM | POA: Diagnosis not present

## 2020-12-09 DIAGNOSIS — Z170 Estrogen receptor positive status [ER+]: Secondary | ICD-10-CM | POA: Insufficient documentation

## 2020-12-09 DIAGNOSIS — Z79811 Long term (current) use of aromatase inhibitors: Secondary | ICD-10-CM | POA: Diagnosis not present

## 2020-12-09 DIAGNOSIS — I10 Essential (primary) hypertension: Secondary | ICD-10-CM | POA: Diagnosis not present

## 2020-12-09 DIAGNOSIS — E876 Hypokalemia: Secondary | ICD-10-CM | POA: Insufficient documentation

## 2020-12-09 LAB — COMPREHENSIVE METABOLIC PANEL
ALT: 15 U/L (ref 0–44)
AST: 23 U/L (ref 15–41)
Albumin: 4.3 g/dL (ref 3.5–5.0)
Alkaline Phosphatase: 99 U/L (ref 38–126)
Anion gap: 12 (ref 5–15)
BUN: 12 mg/dL (ref 6–20)
CO2: 28 mmol/L (ref 22–32)
Calcium: 10.2 mg/dL (ref 8.9–10.3)
Chloride: 101 mmol/L (ref 98–111)
Creatinine, Ser: 1.05 mg/dL — ABNORMAL HIGH (ref 0.44–1.00)
GFR, Estimated: >60 mL/min (ref >60)
Glucose, Bld: 104 mg/dL — ABNORMAL HIGH (ref 70–99)
Potassium: 3.6 mmol/L (ref 3.5–5.1)
Sodium: 141 mmol/L (ref 135–145)
Total Bilirubin: 0.7 mg/dL (ref 0.3–1.2)
Total Protein: 8 g/dL (ref 6.5–8.1)

## 2020-12-09 LAB — CBC WITH DIFFERENTIAL/PLATELET
Abs Immature Granulocytes: 0.03 10*3/uL (ref 0.00–0.07)
Basophils Absolute: 0 10*3/uL (ref 0.0–0.1)
Basophils Relative: 1 %
Eosinophils Absolute: 0.2 10*3/uL (ref 0.0–0.5)
Eosinophils Relative: 2 %
HCT: 36.8 % (ref 36.0–46.0)
Hemoglobin: 12.1 g/dL (ref 12.0–15.0)
Immature Granulocytes: 0 %
Lymphocytes Relative: 29 %
Lymphs Abs: 2.4 10*3/uL (ref 0.7–4.0)
MCH: 28.7 pg (ref 26.0–34.0)
MCHC: 32.9 g/dL (ref 30.0–36.0)
MCV: 87.2 fL (ref 80.0–100.0)
Monocytes Absolute: 0.6 10*3/uL (ref 0.1–1.0)
Monocytes Relative: 7 %
Neutro Abs: 5 10*3/uL (ref 1.7–7.7)
Neutrophils Relative %: 61 %
Platelets: 288 10*3/uL (ref 150–400)
RBC: 4.22 MIL/uL (ref 3.87–5.11)
RDW: 12.9 % (ref 11.5–15.5)
WBC: 8.1 10*3/uL (ref 4.0–10.5)
nRBC: 0 % (ref 0.0–0.2)

## 2020-12-09 NOTE — Progress Notes (Signed)
[place]   [phone] [phone]) [phone]   Clinic Follow up Note   Patient Care Team: Lucianne Lei, MD as PCP - General (Family Medicine) Excell Seltzer, MD (Inactive) as Consulting Physician (General Surgery) Truitt Merle, MD as Consulting Physician (Hematology) Gardenia Phlegm, NP as Nurse Practitioner (Hematology and Oncology) [name], MD as Consulting Physician (Radiation Oncology)  Date of Service:  12/09/2020  CHIEF COMPLAINT: f/u of right breast cancer  SUMMARY OF ONCOLOGIC HISTORY: Oncology History Overview Note  Breast cancer of lower-outer quadrant of right female breast [name])   Staging form: Breast, AJCC 7th Edition     Clinical stage from 08/03/2015: Stage IA (T1c, N0, M0) - Unsigned     Pathologic stage from 09/29/2015: Stage IIIA (T3(m), N1a, cM0) - Signed by Truitt Merle, MD on 10/17/2015       Breast cancer of lower-outer quadrant of right female breast ([place])  07/25/2015 Mammogram   diagnostic mammogram and ultrasound showed a 11 mm mass in the 8:00 position of the right breast, small irregular lesion in the retroareolar regio of the right breast, contiguous with the dermis   07/26/2015 Initial Diagnosis   Breast cancer of lower-outer quadrant of right female breast ([place])   07/26/2015 Initial Biopsy   right breast needle core biopsy at 8:00 position (1), invasive ductal carcinoma, grade 3, 6:30 o'clock position invasive (2) and in situ ductal carcinoma, grade 1-2, lymphovascular invasion is identified   07/26/2015 Receptors her2   (1) ER 95% positive, PR 95% positive, Ki-67 30%, HER-2 positive with ratio 2.04, copy # 5.20, (2) ER 90% positive, PR 95% positive, Ki-67 10%, HER-2 negative.   08/02/2015 Imaging   breast MRI showed extensive mass  and non-mass enhancement in the lower outer quadrant of the right breast  spanning 13 cm, focal enhancement within the right nipple, at least 3-4 abnormal appearing right axillary lymph  node.   09/29/2015 Surgery   right mastectomy and axillar node dissection    09/29/2015 Pathology Results   Right mastectomy and axillary node dissection showed multifocal mammary carcinoma with ductal and lobular features, tumor size 13 cm, 2.1 cm, 1.9 cm, and 0.5 cm, grade 2-3, anterior margins were positive, 2 nodes with metastasis and one with isolated cells   11/01/2015 - 02/14/2016 Chemotherapy   TCHP every 3 weeks, for 6 cycles, followed by herceptin maintenance therapy for a total of one year    03/06/2016 - 11/01/2016 Antibody Plan   Maintenance Herceptin and perjeta, every 3 weeks   03/07/2016 - 04/27/2016 Radiation Therapy   03/07/16-04/27/16 Site/dose: 1) 63fld right chest wall/ 45 Gy in 25 fractions 2) Right mastectomy scar / 16 Gy in 8 fractions   05/21/2016 -  Anti-estrogen oral therapy   Tamoxifen 20 mg daily starting 05/21/16 Switched to Letrozole 2.$RemoveBefore'5mg'gWcpVAEahUvBT$  daily on 01/17/18.  Started monthyl Zolodex injections on 12/16/17. Last on 10/17/19 as she is s/p BSO on 10/27/19.    10/17/2016 Mammogram   IMPRESSION: No mammographic evidence of malignancy. A result letter of this screening mammogram will be mailed directly to the patient.   10/22/2016 Echocardiogram   Impressions:   - Normal study. Stable GLPSS at -21%. LVEF 60-65%.   11/09/2016 - 11/09/2017 Anti-estrogen oral therapy    Neratinib $RemoveBe'240mg'yCAFpiKKB$  daily started on 11/09/2016. Completed in one year   02/04/2017 Survivorship   Survivorship Clinic with Gardenia Phlegm, NP   03/11/2017 Surgery   REMOVAL OF RIGHT TISSUE EXPANDERS WITH PLACEMRIGHT SILICONE  BREAST IMPLANTS and  left MASTOPEXY FOR SYMENTRY by Dr. Marla Roe  03/11/17   03/11/2017 Pathology Results   Diagnosis 03/11/17 1. Breast, capsule, right inferior BENIGN FIBROVASCULAR TISSUE 2. Breast, Mammoplasty, left BENIGN BREAST TISSUE NEGATIVE FOR ATYPIA OR MALIGNANCY   10/27/2019 Surgery   XI ROBOTIC ASSISTED TOTAL HYSTERECTOMY WITH BILATERAL SALPINGO OOPHORECTOMY  by Dr Ihor Dow and Dr Harolyn Rutherford   FINAL MICROSCOPIC DIAGNOSIS:   A. UTERUS, CERVIX, BILATERAL TUBES AND OVARIES:  - Uterus with leiomyomata, largest measuring 2.2 cm  - Benign inactive endometrium  - Adenomyosis  - Benign unremarkable cervix  - Benign unremarkable bilateral fallopian tubes and ovaries  - No evidence of malignancy       CURRENT THERAPY:  Letrozole 2.$RemoveBefo'5mg'RMImLFyMfEl$  daily started on 9/27//2019  INTERVAL HISTORY:  Melanie Moss is here for a follow up of breast cancer. She was last seen by me on 06/10/20. She presents to the clinic alone. She reports doing okay overall. She notes she was recently placed on medication for anxiety and depression.  She reports she is tolerating letrozole well with some joint pain in her legs. This remains tolerable. She notes she has changed her eating habits, increased her exercise, and has successfully lost some weight.   All other systems were reviewed with the patient and are negative.  MEDICAL HISTORY:  Past Medical History:  Diagnosis Date   Anemia    Anxiety    Breast cancer of lower-outer quadrant of right female breast ([place]) 07/29/2015   Family history of breast cancer    Family history of colon cancer    Family history of prostate cancer    GERD (gastroesophageal reflux disease)    tums    History of radiation therapy 03/07/16-04/27/16   right chest wall 45 Gy in 25 fractions, right mastectomy scar 16 Gy in 8 fractions   Hyperlipidemia    Hypertension    Lymphedema    Personal history of chemotherapy    Personal history of radiation therapy    Preterm labor     SURGICAL HISTORY: Past Surgical History:  Procedure Laterality Date   AXILLARY LYMPH NODE DISSECTION Right 09/29/2015   Procedure: AXILLARY LYMPH NODE DISSECTION;  Surgeon: Excell Seltzer, MD;  Location: [place];  Service: General;  Laterality: Right;   BREAST IMPLANT REMOVAL Right 06/01/2017   Procedure: EXPLANT OF RIGHT 480 MENTOR BREAST IMPLANT;  Surgeon:  Wallace Going, DO;  Location: [place];  Service: Plastics;  Laterality: Right;   BREAST RECONSTRUCTION Right 08/08/2017   Procedure: RIGHT BREAST RECONSTRUCTION WITH LATISSMA MYOCUTANEOUS FLAP AND EXPANDER PLACEMENT;  Surgeon: Wallace Going, DO;  Location: [place];  Service: Plastics;  Laterality: Right;   BREAST RECONSTRUCTION WITH PLACEMENT OF TISSUE EXPANDER AND FLEX HD (ACELLULAR HYDRATED DERMIS) Right 09/29/2015   Procedure: BREAST RECONSTRUCTION WITH PLACEMENT OF TISSUE EXPANDER AND FLEX HD (ACELLULAR HYDRATED DERMIS);  Surgeon: Loel Lofty Dillingham, DO;  Location: [place];  Service: Plastics;  Laterality: Right;   CERVICAL CERCLAGE  02/19/2011   Procedure: CERCLAGE CERVICAL;  Surgeon: Agnes Lawrence, MD;  Location: [facility];  Service: Gynecology;  Laterality: N/A;   DILATION AND CURETTAGE OF UTERUS     INCISION AND DRAINAGE OF WOUND Right 04/19/2017   Procedure: IRRIGATION OF RIGHT BREAST POCKET;  Surgeon: Wallace Going, DO;  Location: [place];  Service: Plastics;  Laterality: Right;   LATISSIMUS FLAP TO BREAST Right 08/08/2017   Procedure: LATISSIMUS FLAP TO BREAST;  Surgeon: Wallace Going, DO;  Location: [place];  Service: Clinical cytogeneticist;  Laterality: Right;   MASTECTOMY Right 2017   right 2017   MASTECTOMY WITH AXILLARY LYMPH NODE DISSECTION Right 09/29/2015   MASTOPEXY Left 03/11/2017   Procedure: MASTOPEXY FOR SYMENTRY;  Surgeon: Wallace Going, DO;  Location: [place];  Service: Plastics;  Laterality: Left;   PORT-A-CATH REMOVAL  10/2016   PORTACATH PLACEMENT N/A 09/29/2015   Procedure: INSERTION PORT-A-CATH;  Surgeon: Excell Seltzer, MD;  Location: [place];  Service: General;  Laterality: N/A;   REMOVAL OF BILATERAL TISSUE EXPANDERS WITH PLACEMENT OF BILATERAL BREAST IMPLANTS Right 03/11/2017   Procedure: REMOVAL OF RIGHT TISSUE EXPANDERS WITH PLACEMRIGHT SILICONE  BREAST IMPLANTS;  Surgeon: Wallace Going, DO;  Location:  [place];  Service: Plastics;  Laterality: Right;   REMOVAL OF TISSUE EXPANDER AND PLACEMENT OF IMPLANT Right 10/19/2017   Procedure: REMOVAL OF TISSUE EXPANDER;  Surgeon: Wallace Going, DO;  Location: [place];  Service: Plastics;  Laterality: Right;   ROBOTIC ASSISTED TOTAL HYSTERECTOMY WITH BILATERAL SALPINGO OOPHERECTOMY Bilateral 10/27/2019   Procedure: XI ROBOTIC ASSISTED TOTAL HYSTERECTOMY WITH BILATERAL SALPINGO OOPHORECTOMY;  Surgeon: Lavonia Drafts, MD;  Location: [place];  Service: Gynecology;  Laterality: Bilateral;   SIMPLE MASTECTOMY WITH AXILLARY SENTINEL NODE BIOPSY Right 09/29/2015   Procedure: RIGHT TOTAL  MASTECTOMY WITH AXILLARY SENTINEL NODE BIOPSY;  Surgeon: Excell Seltzer, MD;  Location: [place];  Service: General;  Laterality: Right;    I have reviewed the social history and family history with the patient and they are unchanged from previous note.  ALLERGIES:  has No Known Allergies.  MEDICATIONS:  Current Outpatient Medications  Medication Sig Dispense Refill   albuterol (VENTOLIN HFA) 108 (90 Base) MCG/ACT inhaler Inhale 1-2 puffs into the lungs every 6 (six) hours as needed for wheezing or shortness of breath. (Patient not taking: Reported on 06/10/2020) 18 g 0   B Complex-C (SUPER B COMPLEX PO) Take 1 tablet by mouth daily.     benzonatate (TESSALON) 100 MG capsule Take 1 capsule (100 mg total) by mouth every 8 (eight) hours. (Patient not taking: Reported on 06/10/2020) 21 capsule 0   ezetimibe (ZETIA) 10 MG tablet Take 10 mg by mouth at bedtime. (Patient not taking: Reported on 06/10/2020)     irbesartan-hydrochlorothiazide (AVALIDE) 150-12.5 MG tablet Take 1 tablet by mouth daily. (Patient not taking: Reported on 06/10/2020)     letrozole (FEMARA) 2.5 MG tablet TAKE 1 TABLET BY MOUTH EVERY DAY 90 tablet 3   Mouthwash Compounding Base LIQD Swish and spit 10 mLs every 6 (six) hours as needed. Maalox;80 ml: Lidocaine 2%; 80  ml: Benadryl 12.$RemoveBeforeD'5mg'lewYNxbMJLhsow$ /ml; 80 ml (Patient not taking: Reported on 06/10/2020) 240 mL 0   PARoxetine (PAXIL CR) 25 MG 24 hr tablet Take 1 tablet (25 mg total) by mouth daily. 90 tablet 12   potassium chloride (KLOR-CON) 10 MEQ tablet Take 1 tablet (10 mEq total) by mouth 2 (two) times daily. 60 tablet 0   Vitamin D3 (VITAMIN D) 25 MCG tablet Take 1,000 Units by mouth daily. (Patient not taking: Reported on 06/10/2020)     No current facility-administered medications for this visit.   Facility-Administered Medications Ordered in Other Visits  Medication Dose Route Frequency Provider Last Rate Last Admin   0.9 %  sodium chloride infusion   Intravenous Once Truitt Merle, MD       alteplase (CATHFLO ACTIVASE) injection 2 mg  2 mg Intracatheter Once PRN Truitt Merle, MD  heparin lock flush 100 unit/mL  250 Units Intracatheter Once PRN Truitt Merle, MD       heparin lock flush 100 unit/mL  500 Units Intravenous Once PRN Truitt Merle, MD       sodium chloride 0.9 % injection 10 mL  10 mL Intracatheter PRN Truitt Merle, MD       sodium chloride 0.9 % injection 10 mL  10 mL Intravenous PRN Truitt Merle, MD       sodium chloride 0.9 % injection 3 mL  3 mL Intravenous Once PRN Truitt Merle, MD       sodium chloride flush (NS) 0.9 % injection 10 mL  10 mL Intravenous PRN Truitt Merle, MD        PHYSICAL EXAMINATION: ECOG PERFORMANCE STATUS: 0 - Asymptomatic  Vitals:   12/09/20 0915  BP: 111/81  Pulse: 78  Resp: 20  Temp: 99.6 F (37.6 C)  SpO2: 99%   Wt Readings from Last 3 Encounters:  12/09/20 155 lb 1.6 oz (70.4 kg)  06/10/20 161 lb 4.8 oz (73.2 kg)  12/07/19 155 lb (70.3 kg)     GENERAL:alert, no distress and comfortable SKIN: skin color, texture, turgor are normal, no rashes or significant lesions EYES: normal, Conjunctiva are pink and non-injected, sclera clear  NECK: supple, thyroid normal size, non-tender, without nodularity LYMPH:  no palpable lymphadenopathy in the cervical, axillary  LUNGS: clear to  auscultation and percussion with normal breathing effort HEART: regular rate & rhythm and no murmurs and no lower extremity edema ABDOMEN:abdomen soft, non-tender and normal bowel sounds Musculoskeletal:no cyanosis of digits and no clubbing  NEURO: alert & oriented x 3 with fluent speech, no focal motor/sensory deficits BREAST: s/p right mastectomy. No palpable mass, nodules or adenopathy bilaterally. Left breast exam benign.   LABORATORY DATA:  I have reviewed the data as listed CBC Latest Ref Rng & Units 12/09/2020 06/10/2020 12/07/2019  WBC 4.0 - 10.5 K/uL 8.1 6.8 6.2  Hemoglobin 12.0 - 15.0 g/dL 12.1 11.9(L) 11.7(L)  Hematocrit 36.0 - 46.0 % 36.8 36.4 36.7  Platelets 150 - 400 K/uL 288 289 245     CMP Latest Ref Rng & Units 12/09/2020 06/10/2020 12/07/2019  Glucose 70 - 99 mg/dL 104(H) 141(H) 122(H)  BUN 6 - 20 mg/dL $Remove'12 13 13  'VdhhCiB$ Creatinine 0.44 - 1.00 mg/dL 1.05(H) 1.10(H) 0.95  Sodium 135 - 145 mmol/L 141 140 139  Potassium 3.5 - 5.1 mmol/L 3.6 3.0(L) 4.0  Chloride 98 - 111 mmol/L 101 103 104  CO2 22 - 32 mmol/L $RemoveB'28 26 27  'cAWuvaEN$ Calcium 8.9 - 10.3 mg/dL 10.2 9.7 10.4(H)  Total Protein 6.5 - 8.1 g/dL 8.0 7.7 7.5  Total Bilirubin 0.3 - 1.2 mg/dL 0.7 0.5 0.3  Alkaline Phos 38 - 126 U/L 99 87 97  AST 15 - 41 U/L $Remo'23 25 18  'fAEOl$ ALT 0 - 44 U/L $Remo'15 15 8      'yreeD$ RADIOGRAPHIC STUDIES: I have personally reviewed the radiological images as listed and agreed with the findings in the report. No results found.   ASSESSMENT & PLAN:  Melanie Moss is a 45 y.o. female with   1. 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 Treated with right mastectomy, radiation, adjuvant chemo and HER2 antibodies. (She underwent reconstruction but had recurrent infections and ultimately had the implants removed) -She started antiestrogen  therapy with Tamoxifen in 04/2016 and was switched to Letrozole on 01/17/2018 with ovarian suppression. She started Zoladex injections monthly in 11/2017.  -She underwent hysterectomy with BSO on 10/27/19. Her last Zoladex injection was 10/17/19.  -She requested referral to a plastic surgeon who performs flap reconstruction. She notes she previously met someone but did not like them. I will provide her with some names. -She is clinically doing well. Lab reviewed, her CBC and CMP are within normal limits. Her physical exam and her 10/2020 left mammogram were unremarkable. There is no clinical concern for recurrence. -Continue surveillance. Next Mammogram in 11/2021  -Continue Letrozole. -F/u in 6 months    2. Iron deficient anemia, and anemia of chronic disease  -resolved with hysterectomy 10/27/19    3.  HTN, new Hyperglycemia, Weight gain   -On medications. Continue to f/u with PCP -She has modified her eating habits and increased her exercise. She has successfully lost 6 lbs in the last 6 months. Her BG was down to 104 today and her BP was 111/81 (12/09/20)   4. Right arm lymphedema  -Mild now. Intermittently uses compression sleeve on her right arm. I will write her a prescription for another sleeve.   5. Hot flashes, joint stiffness and pain, osteopenia -Secondary to AI and BSO -Joint pain is mainly in her legs and is tolerable.  -She is on Paxil which helped her the most, but she notes increased anxiety on 25 mg dose.  -Her last bone density scan was many years ago. Her latest DEXA from 11/2019 showed osteopenia with lowest T-score -1.1 at right femur neck.    6. Anxiety and depression -she previously noted increased anxiety on the Paxil. -she was recently placed on Xanax and continues on Celexa.   7. Hypokalemia  -resolved, K 3.6 today (12/09/20)  8. Covid-19+ on 10/10/20 -her whole family had it. she only had a bad cough -they have all recovered well.     Plan -Continue Letrozole   -Mammogram in 10/2021  -I gave her prescription of sleeve and prosthetic bra  -will refer her to plastic surgeon at [facility], she will review the options and let us know, I sent a message to navigator  -Lab and f/u in 6 months     No problem-specific Assessment & Plan notes found for this encounter.   No orders of the defined types were placed in this encounter.  All questions were answered. The patient knows to call the clinic with any problems, questions or concerns. No barriers to learning was detected. The total time spent in the appointment was 30 minutes.     Truitt Merle, MD 12/09/2020   I, Wilburn Mylar, am acting as scribe for Truitt Merle, MD.   I have reviewed the above documentation for accuracy and completeness, and I agree with the above.

## 2020-12-09 NOTE — Telephone Encounter (Signed)
Per patient leave plastic surgeon information on her voicemail as she doesn't have a way to write it down now.  Contact info for Dr. Wynne Dust and Dr. Dewaine Conger left on her voicemail.

## 2021-01-22 ENCOUNTER — Other Ambulatory Visit: Payer: Self-pay | Admitting: Nurse Practitioner

## 2021-01-22 DIAGNOSIS — D500 Iron deficiency anemia secondary to blood loss (chronic): Secondary | ICD-10-CM

## 2021-06-07 ENCOUNTER — Other Ambulatory Visit: Payer: Self-pay

## 2021-06-07 DIAGNOSIS — C50511 Malignant neoplasm of lower-outer quadrant of right female breast: Secondary | ICD-10-CM

## 2021-06-08 ENCOUNTER — Other Ambulatory Visit: Payer: Self-pay

## 2021-06-08 ENCOUNTER — Encounter: Payer: Self-pay | Admitting: Nurse Practitioner

## 2021-06-08 ENCOUNTER — Inpatient Hospital Stay: Payer: No Typology Code available for payment source

## 2021-06-08 ENCOUNTER — Inpatient Hospital Stay: Payer: No Typology Code available for payment source | Attending: Nurse Practitioner | Admitting: Nurse Practitioner

## 2021-06-08 VITALS — BP 120/85 | HR 95 | Temp 98.2°F | Resp 18 | Ht 63.0 in | Wt 162.4 lb

## 2021-06-08 DIAGNOSIS — Z79899 Other long term (current) drug therapy: Secondary | ICD-10-CM | POA: Diagnosis not present

## 2021-06-08 DIAGNOSIS — Z90710 Acquired absence of both cervix and uterus: Secondary | ICD-10-CM | POA: Insufficient documentation

## 2021-06-08 DIAGNOSIS — Z79811 Long term (current) use of aromatase inhibitors: Secondary | ICD-10-CM | POA: Diagnosis not present

## 2021-06-08 DIAGNOSIS — M85851 Other specified disorders of bone density and structure, right thigh: Secondary | ICD-10-CM | POA: Diagnosis not present

## 2021-06-08 DIAGNOSIS — R739 Hyperglycemia, unspecified: Secondary | ICD-10-CM | POA: Diagnosis not present

## 2021-06-08 DIAGNOSIS — M2560 Stiffness of unspecified joint, not elsewhere classified: Secondary | ICD-10-CM | POA: Diagnosis not present

## 2021-06-08 DIAGNOSIS — I10 Essential (primary) hypertension: Secondary | ICD-10-CM | POA: Insufficient documentation

## 2021-06-08 DIAGNOSIS — Z923 Personal history of irradiation: Secondary | ICD-10-CM | POA: Diagnosis not present

## 2021-06-08 DIAGNOSIS — I890 Lymphedema, not elsewhere classified: Secondary | ICD-10-CM | POA: Diagnosis not present

## 2021-06-08 DIAGNOSIS — N951 Menopausal and female climacteric states: Secondary | ICD-10-CM | POA: Insufficient documentation

## 2021-06-08 DIAGNOSIS — Z170 Estrogen receptor positive status [ER+]: Secondary | ICD-10-CM | POA: Insufficient documentation

## 2021-06-08 DIAGNOSIS — R9389 Abnormal findings on diagnostic imaging of other specified body structures: Secondary | ICD-10-CM

## 2021-06-08 DIAGNOSIS — E2839 Other primary ovarian failure: Secondary | ICD-10-CM | POA: Diagnosis not present

## 2021-06-08 DIAGNOSIS — Z9011 Acquired absence of right breast and nipple: Secondary | ICD-10-CM | POA: Insufficient documentation

## 2021-06-08 DIAGNOSIS — Z9221 Personal history of antineoplastic chemotherapy: Secondary | ICD-10-CM | POA: Diagnosis not present

## 2021-06-08 DIAGNOSIS — F418 Other specified anxiety disorders: Secondary | ICD-10-CM | POA: Insufficient documentation

## 2021-06-08 DIAGNOSIS — C50511 Malignant neoplasm of lower-outer quadrant of right female breast: Secondary | ICD-10-CM | POA: Insufficient documentation

## 2021-06-08 LAB — CBC WITH DIFFERENTIAL (CANCER CENTER ONLY)
Abs Immature Granulocytes: 0.01 10*3/uL (ref 0.00–0.07)
Basophils Absolute: 0 10*3/uL (ref 0.0–0.1)
Basophils Relative: 1 %
Eosinophils Absolute: 0.2 10*3/uL (ref 0.0–0.5)
Eosinophils Relative: 4 %
HCT: 39.7 % (ref 36.0–46.0)
Hemoglobin: 12.5 g/dL (ref 12.0–15.0)
Immature Granulocytes: 0 %
Lymphocytes Relative: 44 %
Lymphs Abs: 2.4 10*3/uL (ref 0.7–4.0)
MCH: 27.5 pg (ref 26.0–34.0)
MCHC: 31.5 g/dL (ref 30.0–36.0)
MCV: 87.4 fL (ref 80.0–100.0)
Monocytes Absolute: 0.6 10*3/uL (ref 0.1–1.0)
Monocytes Relative: 11 %
Neutro Abs: 2.2 10*3/uL (ref 1.7–7.7)
Neutrophils Relative %: 40 %
Platelet Count: 289 10*3/uL (ref 150–400)
RBC: 4.54 MIL/uL (ref 3.87–5.11)
RDW: 13.4 % (ref 11.5–15.5)
WBC Count: 5.5 10*3/uL (ref 4.0–10.5)
nRBC: 0 % (ref 0.0–0.2)

## 2021-06-08 LAB — CMP (CANCER CENTER ONLY)
ALT: 18 U/L (ref 0–44)
AST: 27 U/L (ref 15–41)
Albumin: 4.6 g/dL (ref 3.5–5.0)
Alkaline Phosphatase: 87 U/L (ref 38–126)
Anion gap: 7 (ref 5–15)
BUN: 11 mg/dL (ref 6–20)
CO2: 28 mmol/L (ref 22–32)
Calcium: 9.8 mg/dL (ref 8.9–10.3)
Chloride: 105 mmol/L (ref 98–111)
Creatinine: 0.93 mg/dL (ref 0.44–1.00)
GFR, Estimated: >60 mL/min (ref >60)
Glucose, Bld: 104 mg/dL — ABNORMAL HIGH (ref 70–99)
Potassium: 3.7 mmol/L (ref 3.5–5.1)
Sodium: 140 mmol/L (ref 135–145)
Total Bilirubin: 0.6 mg/dL (ref 0.3–1.2)
Total Protein: 7.6 g/dL (ref 6.5–8.1)

## 2021-06-08 NOTE — Progress Notes (Signed)
[facility]   [phone] [phone]   Clinic Follow up Note   Patient Care Team: Lucianne Lei, MD as PCP - General (Family Medicine) Excell Seltzer, MD (Inactive) as Consulting Physician (General Surgery) Truitt Merle, MD as Consulting Physician (Hematology) Gardenia Phlegm, NP as Nurse Practitioner (Hematology and Oncology) [name], MD as Consulting Physician (Radiation Oncology) 06/08/2021  CHIEF COMPLAINT: Follow up right breast cancer   SUMMARY OF ONCOLOGIC HISTORY: Oncology History Overview Note  Breast cancer of lower-outer quadrant of right female breast [facility])   Staging form: Breast, AJCC 7th Edition     Clinical stage from 08/03/2015: Stage IA (T1c, N0, M0) - Unsigned     Pathologic stage from 09/29/2015: Stage IIIA (T3(m), N1a, cM0) - Signed by Truitt Merle, MD on 10/17/2015       Breast cancer of lower-outer quadrant of right female breast ([place])  07/25/2015 Mammogram   diagnostic mammogram and ultrasound showed a 11 mm mass in the 8:00 position of the right breast, small irregular lesion in the retroareolar regio of the right breast, contiguous with the dermis   07/26/2015 Initial Diagnosis   Breast cancer of lower-outer quadrant of right female breast ([place])   07/26/2015 Initial Biopsy   right breast needle core biopsy at 8:00 position (1), invasive ductal carcinoma, grade 3, 6:30 o'clock position invasive (2) and in situ ductal carcinoma, grade 1-2, lymphovascular invasion is identified   07/26/2015 Receptors her2   (1) ER 95% positive, PR 95% positive, Ki-67 30%, HER-2 positive with ratio 2.04, copy # 5.20, (2) ER 90% positive, PR 95% positive, Ki-67 10%, HER-2 negative.   08/02/2015 Imaging   breast MRI showed extensive mass  and non-mass enhancement in the lower outer quadrant of the right breast  spanning 13 cm, focal enhancement within the right nipple, at least 3-4 abnormal appearing right axillary lymph node.   09/29/2015  Surgery   right mastectomy and axillar node dissection    09/29/2015 Pathology Results   Right mastectomy and axillary node dissection showed multifocal mammary carcinoma with ductal and lobular features, tumor size 13 cm, 2.1 cm, 1.9 cm, and 0.5 cm, grade 2-3, anterior margins were positive, 2 nodes with metastasis and one with isolated cells   11/01/2015 - 02/14/2016 Chemotherapy   TCHP every 3 weeks, for 6 cycles, followed by herceptin maintenance therapy for a total of one year    03/06/2016 - 11/01/2016 Antibody Plan   Maintenance Herceptin and perjeta, every 3 weeks   03/07/2016 - 04/27/2016 Radiation Therapy   03/07/16-04/27/16 Site/dose: 1) 69fld right chest wall/ 45 Gy in 25 fractions 2) Right mastectomy scar / 16 Gy in 8 fractions   05/21/2016 -  Anti-estrogen oral therapy   Tamoxifen 20 mg daily starting 05/21/16 Switched to Letrozole 2.$RemoveBefore'5mg'lDDfzAXZOAlRa$  daily on 01/17/18.  Started monthyl Zolodex injections on 12/16/17. Last on 10/17/19 as she is s/p BSO on 10/27/19.    10/17/2016 Mammogram   IMPRESSION: No mammographic evidence of malignancy. A result letter of this screening mammogram will be mailed directly to the patient.   10/22/2016 Echocardiogram   Impressions:   - Normal study. Stable GLPSS at -21%. LVEF 60-65%.   11/09/2016 - 11/09/2017 Anti-estrogen oral therapy    Neratinib $RemoveBe'240mg'ajrFQmfHb$  daily started on 11/09/2016. Completed in one year   02/04/2017 Survivorship   Survivorship Clinic with Gardenia Phlegm, NP   03/11/2017 Surgery   REMOVAL OF RIGHT TISSUE EXPANDERS WITH PLACEMRIGHT SILICONE  BREAST IMPLANTS and left MASTOPEXY FOR [facility]  by Dr. Marla Roe  03/11/17   03/11/2017 Pathology Results   Diagnosis 03/11/17 1. Breast, capsule, right inferior BENIGN FIBROVASCULAR TISSUE 2. Breast, Mammoplasty, left BENIGN BREAST TISSUE NEGATIVE FOR ATYPIA OR MALIGNANCY   10/27/2019 Surgery   XI ROBOTIC ASSISTED TOTAL HYSTERECTOMY WITH BILATERAL SALPINGO OOPHORECTOMY by Dr  Ihor Dow and Dr Harolyn Rutherford   FINAL MICROSCOPIC DIAGNOSIS:   A. UTERUS, CERVIX, BILATERAL TUBES AND OVARIES:  - Uterus with leiomyomata, largest measuring 2.2 cm  - Benign inactive endometrium  - Adenomyosis  - Benign unremarkable cervix  - Benign unremarkable bilateral fallopian tubes and ovaries  - No evidence of malignancy      CURRENT THERAPY: Letrozole 2.5 mg daily, starting 01/17/18  INTERVAL HISTORY: Melanie Moss returns for follow up as scheduled, last seen by Dr. Burr Medico 12/09/20.  She feels fine except for struggling with depression and anxiety which has caused her to be out of work.  She was followed by psychologist and psychiatrist at [facility] who is managing medication which does not seem to be helping.  She notes she had genetic testing done to determine which medications would be helpful, but she cannot get results until she pays an outstanding balance.  Some days she cannot get out of bed.  She notes a lot of trauma and death in her family lately, and having to sell her house which have all contributed.  She has a 47 year old son who is starting to have behavioral changes as well.  Otherwise she feels well, recovered from a recent respiratory virus.  She continues letrozole with lower body aches and stable hot flashes which are controlled.  She felt a "knot" on her right upper leg a month ago, nonpainful.  She attributes this to may be bumping into furniture in her new house.  All other systems were reviewed with the patient and are negative.  MEDICAL HISTORY:  Past Medical History:  Diagnosis Date   Anemia    Anxiety    Breast cancer of lower-outer quadrant of right female breast ([place]) 07/29/2015   Family history of breast cancer    Family history of colon cancer    Family history of prostate cancer    GERD (gastroesophageal reflux disease)    tums    History of radiation therapy 03/07/16-04/27/16   right chest wall 45 Gy in 25 fractions, right mastectomy scar 16  Gy in 8 fractions   Hyperlipidemia    Hypertension    Lymphedema    Personal history of chemotherapy    Personal history of radiation therapy    Preterm labor     SURGICAL HISTORY: Past Surgical History:  Procedure Laterality Date   AXILLARY LYMPH NODE DISSECTION Right 09/29/2015   Procedure: AXILLARY LYMPH NODE DISSECTION;  Surgeon: Excell Seltzer, MD;  Location: [place];  Service: General;  Laterality: Right;   BREAST IMPLANT REMOVAL Right 06/01/2017   Procedure: EXPLANT OF RIGHT 480 MENTOR BREAST IMPLANT;  Surgeon: Wallace Going, DO;  Location: [place];  Service: Plastics;  Laterality: Right;   BREAST RECONSTRUCTION Right 08/08/2017   Procedure: RIGHT BREAST RECONSTRUCTION WITH LATISSMA MYOCUTANEOUS FLAP AND EXPANDER PLACEMENT;  Surgeon: Wallace Going, DO;  Location: [place];  Service: Plastics;  Laterality: Right;   BREAST RECONSTRUCTION WITH PLACEMENT OF TISSUE EXPANDER AND FLEX HD (ACELLULAR HYDRATED DERMIS) Right 09/29/2015   Procedure: BREAST RECONSTRUCTION WITH PLACEMENT OF TISSUE EXPANDER AND FLEX HD (ACELLULAR HYDRATED DERMIS);  Surgeon: Loel Lofty Dillingham, DO;  Location: [place];  Service: Plastics;  Laterality: Right;   CERVICAL CERCLAGE  02/19/2011   Procedure: CERCLAGE CERVICAL;  Surgeon: Agnes Lawrence, MD;  Location: [facility];  Service: Gynecology;  Laterality: N/A;   DILATION AND CURETTAGE OF UTERUS     INCISION AND DRAINAGE OF WOUND Right 04/19/2017   Procedure: IRRIGATION OF RIGHT BREAST POCKET;  Surgeon: Wallace Going, DO;  Location: [place];  Service: Plastics;  Laterality: Right;   LATISSIMUS FLAP TO BREAST Right 08/08/2017   Procedure: LATISSIMUS FLAP TO BREAST;  Surgeon: Wallace Going, DO;  Location: [place];  Service: Plastics;  Laterality: Right;   MASTECTOMY Right 2017   right 2017   MASTECTOMY WITH AXILLARY LYMPH NODE DISSECTION Right 09/29/2015   MASTOPEXY Left 03/11/2017   Procedure: MASTOPEXY FOR SYMENTRY;  Surgeon:  Wallace Going, DO;  Location: [place];  Service: Plastics;  Laterality: Left;   PORT-A-CATH REMOVAL  10/2016   PORTACATH PLACEMENT N/A 09/29/2015   Procedure: INSERTION PORT-A-CATH;  Surgeon: Excell Seltzer, MD;  Location: [place];  Service: General;  Laterality: N/A;   REMOVAL OF BILATERAL TISSUE EXPANDERS WITH PLACEMENT OF BILATERAL BREAST IMPLANTS Right 03/11/2017   Procedure: REMOVAL OF RIGHT TISSUE EXPANDERS WITH PLACEMRIGHT SILICONE  BREAST IMPLANTS;  Surgeon: Wallace Going, DO;  Location: [place];  Service: Plastics;  Laterality: Right;   REMOVAL OF TISSUE EXPANDER AND PLACEMENT OF IMPLANT Right 10/19/2017   Procedure: REMOVAL OF TISSUE EXPANDER;  Surgeon: Wallace Going, DO;  Location: [place];  Service: Plastics;  Laterality: Right;   ROBOTIC ASSISTED TOTAL HYSTERECTOMY WITH BILATERAL SALPINGO OOPHERECTOMY Bilateral 10/27/2019   Procedure: XI ROBOTIC ASSISTED TOTAL HYSTERECTOMY WITH BILATERAL SALPINGO OOPHORECTOMY;  Surgeon: Lavonia Drafts, MD;  Location: [place];  Service: Gynecology;  Laterality: Bilateral;   SIMPLE MASTECTOMY WITH AXILLARY SENTINEL NODE BIOPSY Right 09/29/2015   Procedure: RIGHT TOTAL  MASTECTOMY WITH AXILLARY SENTINEL NODE BIOPSY;  Surgeon: Excell Seltzer, MD;  Location: [place];  Service: General;  Laterality: Right;    I have reviewed the social history and family history with the patient and they are unchanged from previous note.  ALLERGIES:  has No Known Allergies.  MEDICATIONS:  Current Outpatient Medications  Medication Sig Dispense Refill   albuterol (VENTOLIN HFA) 108 (90 Base) MCG/ACT inhaler Inhale 1-2 puffs into the lungs every 6 (six) hours as needed for wheezing or shortness of breath. (Patient not taking: Reported on 06/10/2020) 18 g 0   ALPRAZolam (XANAX) 0.25 MG tablet Take 0.125 mg by mouth daily as needed.     ARIPiprazole (ABILIFY) 10 MG tablet Take 10 mg by mouth daily.      B Complex-C (SUPER B COMPLEX PO) Take 1 tablet by mouth daily.     benzonatate (TESSALON) 100 MG capsule Take 1 capsule (100 mg total) by mouth every 8 (eight) hours. (Patient not taking: Reported on 06/10/2020) 21 capsule 0   ezetimibe (ZETIA) 10 MG tablet Take 10 mg by mouth at bedtime. (Patient not taking: Reported on 06/10/2020)     FARXIGA 5 MG TABS tablet Take 5 mg by mouth every morning.     irbesartan-hydrochlorothiazide (AVALIDE) 150-12.5 MG tablet Take 1 tablet by mouth daily. (Patient not taking: Reported on 06/10/2020)     letrozole (FEMARA) 2.5 MG tablet TAKE 1 TABLET BY MOUTH EVERY DAY 90 tablet 3   Mouthwash Compounding Base LIQD Swish and spit 10 mLs every 6 (six) hours as needed. Maalox;80 ml: Lidocaine 2%; 80 ml: Benadryl  12.'5mg'$ /ml; 80 ml (Patient not taking: Reported on 06/10/2020) 240 mL 0   PARoxetine (PAXIL CR) 25 MG 24 hr tablet Take 1 tablet (25 mg total) by mouth daily. 90 tablet 12   potassium chloride (KLOR-CON) 10 MEQ tablet Take 1 tablet (10 mEq total) by mouth 2 (two) times daily. 60 tablet 0   rosuvastatin (CRESTOR) 10 MG tablet Take 10 mg by mouth daily.     Vitamin D3 (VITAMIN D) 25 MCG tablet Take 1,000 Units by mouth daily. (Patient not taking: Reported on 06/10/2020)     zolpidem (AMBIEN CR) 6.25 MG CR tablet Take 6.25 mg by mouth at bedtime as needed.     No current facility-administered medications for this visit.   Facility-Administered Medications Ordered in Other Visits  Medication Dose Route Frequency Provider Last Rate Last Admin   0.9 %  sodium chloride infusion   Intravenous Once Truitt Merle, MD       alteplase (CATHFLO ACTIVASE) injection 2 mg  2 mg Intracatheter Once PRN Truitt Merle, MD       heparin lock flush 100 unit/mL  250 Units Intracatheter Once PRN Truitt Merle, MD       heparin lock flush 100 unit/mL  500 Units Intravenous Once PRN Truitt Merle, MD       sodium chloride 0.9 % injection 10 mL  10 mL Intracatheter PRN Truitt Merle, MD       sodium  chloride 0.9 % injection 10 mL  10 mL Intravenous PRN Truitt Merle, MD       sodium chloride 0.9 % injection 3 mL  3 mL Intravenous Once PRN Truitt Merle, MD       sodium chloride flush (NS) 0.9 % injection 10 mL  10 mL Intravenous PRN Truitt Merle, MD        PHYSICAL EXAMINATION: ECOG PERFORMANCE STATUS: 1 - Symptomatic but completely ambulatory  Vitals:   06/08/21 0926  BP: 120/85  Pulse: 95  Resp: 18  Temp: 98.2 F (36.8 C)  SpO2: 98%   Filed Weights   06/08/21 0926  Weight: 162 lb 6.4 oz (73.7 kg)    GENERAL:alert, no distress and comfortable SKIN: No rash EYES: sclera clear NECK: Without mass LYMPH:  no palpable cervical or supraclavicular lymphadenopathy  LUNGS: normal breathing effort HEART:  no lower extremity edema ABDOMEN:abdomen soft, non-tender and normal bowel sounds Musculoskeletal: Soft tissue density at the upper anterior right thigh NEURO: alert & oriented x 3 with fluent speech, no focal motor/sensory deficits Breast exam: S/p right mastectomy, incision completely healed.  No palpable mass or nodularity along the right incision, chest wall, or axilla.  Left without nipple discharge or inversion.  No palpable mass or adenopathy.  LABORATORY DATA:  I have reviewed the data as listed CBC Latest Ref Rng & Units 06/08/2021 12/09/2020 06/10/2020  WBC 4.0 - 10.5 K/uL 5.5 8.1 6.8  Hemoglobin 12.0 - 15.0 g/dL 12.5 12.1 11.9(L)  Hematocrit 36.0 - 46.0 % 39.7 36.8 36.4  Platelets 150 - 400 K/uL 289 288 289     CMP Latest Ref Rng & Units 06/08/2021 12/09/2020 06/10/2020  Glucose 70 - 99 mg/dL 104(H) 104(H) 141(H)  BUN 6 - 20 mg/dL $Remove'11 12 13  'TzcYksT$ Creatinine 0.44 - 1.00 mg/dL 0.93 1.05(H) 1.10(H)  Sodium 135 - 145 mmol/L 140 141 140  Potassium 3.5 - 5.1 mmol/L 3.7 3.6 3.0(L)  Chloride 98 - 111 mmol/L 105 101 103  CO2 22 - 32 mmol/L $RemoveB'28 28 26  'uJriYsrT$ Calcium 8.9 -  10.3 mg/dL 9.8 10.2 9.7  Total Protein 6.5 - 8.1 g/dL 7.6 8.0 7.7  Total Bilirubin 0.3 - 1.2 mg/dL 0.6 0.7 0.5  Alkaline  Phos 38 - 126 U/L 87 99 87  AST 15 - 41 U/L $Remo'27 23 25  'gzDAn$ ALT 0 - 44 U/L $Remo'18 15 15      'bMnSK$ RADIOGRAPHIC STUDIES: I have personally reviewed the radiological images as listed and agreed with the findings in the report. No results found.   ASSESSMENT & PLAN: Melanie Moss is a 46 y.o. female with    1. 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  S/p right mastectomy, adjuvant chemo, radiation, and HER2 antibodies. (She underwent reconstruction but had recurrent infections and ultimately had the implants removed) -She started antiestrogen therapy with Tamoxifen in 04/2016 and was switched to Letrozole on 01/17/2018 with ovarian suppression. She started Zoladex injections monthly in 11/2017.  -She underwent hysterectomy with BSO on 10/27/19. Her last Zoladex injection was 10/17/19.  -S/p flap reconstruction. -Left mammogram 11/18/2020 was negative -Melanie Moss is clinically doing well.  Breast exam is benign, labs unremarkable.  Tolerating letrozole moderately well with stable joint pain and hot flashes  -There is no clinical concern for breast cancer recurrence.   -She is over 5 years from initial diagnosis and has completed 5 years of antiestrogen therapy, tolerating moderately well.  The recommendation is to continue letrozole and surveillance  -we reviewed healthy lifestyle practices  2.  Anxiety and depression  -Managed [facility], currently on Xanax, Abilify, Ambien, and Paxil (for hot flashes) -Med regimen is insufficient -Denies SI -Patient states she underwent genetic testing to see which med regimen would be helpful but has not received results due to an outstanding balance -Reached out to our social work team to see if we have available resources to help her -I provided emotional support with  active listening  3.  Soft tissue density right thigh  -Onset 1 month, nonpainful -Likely lipoma, will perform ultrasound and follow-up on results  4. Hot flashes, joint stiffness and pain, osteopenia -Secondary to AI and BSO -Joint pain is mainly in her legs and is tolerable.  -She is on Paxil which helped her the most -Her latest DEXA from 11/2019 showed osteopenia with lowest T-score -1.1 at right femur neck.   -repeat 11/2021, continue calcium and vit D   5. Iron deficient anemia, and anemia of chronic disease  -resolved with hysterectomy 10/27/19    6.  HTN, new Hyperglycemia, Weight gain   -On medications. Continue to f/u with PCP -She has modified her eating habits and increased her exercise. She has successfully lost 6 lbs in the last 6 months. Her BG was down to 104 today and her BP was 111/81 (12/09/20)   7. Right arm lymphedema  -Mild now. Intermittently uses compression sleeve on her right arm. I will write her a prescription for another sleeve. -resolved   PLAN: -Labs reviewed -Continue letrozole and surveillance  -L mammo and DEXA due 11/2021 -[place] right leg soft tissue -Will discuss mental health with SW and [facility]  -Lab f/up 1 year -Continue f/up with PCP and age appropriate health maintenance   Orders Placed This Encounter  Procedures   [place] RT UPPER EXTREM LTD SOFT TISSUE NON VASCULAR    Standing Status:   Future  Standing Expiration Date:   06/08/2022    Order Specific Question:   Reason for Exam (SYMPTOM  OR DIAGNOSIS REQUIRED)    Answer:   soft tissue density upper anterior R leg    Order Specific Question:   Preferred imaging location?    Answer:   [facility]   MM 3D SCREEN BREAST UNI LEFT    Standing Status:   Future    Standing Expiration Date:   06/08/2022    Order Specific Question:   Reason for Exam (SYMPTOM  OR DIAGNOSIS REQUIRED)    Answer:   h/o R breast cancer s/p mastectomy    Order Specific Question:   Is the patient pregnant?     Answer:   No    Order Specific Question:   Preferred imaging location?    Answer:   [facility]   DG Bone Density    Standing Status:   Future    Standing Expiration Date:   06/08/2022    Order Specific Question:   Reason for Exam (SYMPTOM  OR DIAGNOSIS REQUIRED)    Answer:   osteopenia    Order Specific Question:   Is the patient pregnant?    Answer:   No    Order Specific Question:   Preferred imaging location?    Answer:   [facility]   All questions were answered. The patient knows to call the clinic with any problems, questions or concerns. No barriers to learning was detected. I spent 20 minutes counseling the patient face to face. The total time spent in the appointment was 30 minutes and more than 50% was on counseling and review of test results     Alla Feeling, NP 06/08/21

## 2021-06-12 ENCOUNTER — Encounter: Payer: Self-pay | Admitting: Licensed Clinical Social Worker

## 2021-06-12 NOTE — Progress Notes (Signed)
[place] Work  Clinical Social Work was referred by NP for financial assistance.  Clinical Social Worker contacted patient by phone  to offer support and assess for needs.    Pt has had genetic testing done to determine which medications will most help with mood concerns but is unable to get the results due to outstanding bill. Pt is currently on disability and lives with her son.  She has been in survivorship since 2018.  Pt agreed to have CSW assist her with application to [facility] to determine if they can assist- submitted today.  CSW also shared information on other psychiatrists in the area who should accept pt's insurance.  CSW encouraged pt to call with any other needs or concerns.     [place], [place] Worker [employer]

## 2021-06-19 ENCOUNTER — Ambulatory Visit (HOSPITAL_COMMUNITY)
Admission: RE | Admit: 2021-06-19 | Discharge: 2021-06-19 | Disposition: A | Discharge status: Home / Self Care | Payer: No Typology Code available for payment source | Source: Ambulatory Visit | Attending: Nurse Practitioner | Admitting: Nurse Practitioner

## 2021-06-19 DIAGNOSIS — R9389 Abnormal findings on diagnostic imaging of other specified body structures: Secondary | ICD-10-CM | POA: Insufficient documentation

## 2021-11-13 ENCOUNTER — Encounter: Payer: Self-pay | Admitting: Hematology

## 2021-11-20 ENCOUNTER — Ambulatory Visit
Admission: RE | Admit: 2021-11-20 | Discharge: 2021-11-20 | Disposition: A | Discharge status: Home / Self Care | Payer: Commercial Managed Care - HMO | Source: Ambulatory Visit | Attending: Nurse Practitioner | Admitting: Nurse Practitioner

## 2021-11-20 ENCOUNTER — Encounter: Payer: Self-pay | Admitting: Hematology

## 2021-11-20 DIAGNOSIS — C50511 Malignant neoplasm of lower-outer quadrant of right female breast: Secondary | ICD-10-CM

## 2021-12-18 ENCOUNTER — Ambulatory Visit
Admission: RE | Admit: 2021-12-18 | Discharge: 2021-12-18 | Disposition: A | Discharge status: Home / Self Care | Payer: Commercial Managed Care - HMO | Source: Ambulatory Visit | Attending: Nurse Practitioner | Admitting: Nurse Practitioner

## 2021-12-18 DIAGNOSIS — E2839 Other primary ovarian failure: Secondary | ICD-10-CM

## 2022-02-01 ENCOUNTER — Encounter (HOSPITAL_BASED_OUTPATIENT_CLINIC_OR_DEPARTMENT_OTHER): Payer: Self-pay | Admitting: Emergency Medicine

## 2022-02-01 ENCOUNTER — Other Ambulatory Visit: Payer: Self-pay

## 2022-02-01 ENCOUNTER — Emergency Department (HOSPITAL_BASED_OUTPATIENT_CLINIC_OR_DEPARTMENT_OTHER): Payer: Commercial Managed Care - HMO

## 2022-02-01 ENCOUNTER — Emergency Department (HOSPITAL_BASED_OUTPATIENT_CLINIC_OR_DEPARTMENT_OTHER)
Admission: EM | Admit: 2022-02-01 | Discharge: 2022-02-01 | Disposition: A | Discharge status: Home / Self Care | Payer: Commercial Managed Care - HMO | Attending: Emergency Medicine | Admitting: Emergency Medicine

## 2022-02-01 DIAGNOSIS — Z853 Personal history of malignant neoplasm of breast: Secondary | ICD-10-CM | POA: Diagnosis not present

## 2022-02-01 DIAGNOSIS — I10 Essential (primary) hypertension: Secondary | ICD-10-CM | POA: Insufficient documentation

## 2022-02-01 DIAGNOSIS — M79631 Pain in right forearm: Secondary | ICD-10-CM | POA: Diagnosis not present

## 2022-02-01 DIAGNOSIS — Y92009 Unspecified place in unspecified non-institutional (private) residence as the place of occurrence of the external cause: Secondary | ICD-10-CM | POA: Diagnosis not present

## 2022-02-01 DIAGNOSIS — Z79899 Other long term (current) drug therapy: Secondary | ICD-10-CM | POA: Insufficient documentation

## 2022-02-01 DIAGNOSIS — W108XXA Fall (on) (from) other stairs and steps, initial encounter: Secondary | ICD-10-CM | POA: Insufficient documentation

## 2022-02-01 DIAGNOSIS — Y9301 Activity, walking, marching and hiking: Secondary | ICD-10-CM | POA: Diagnosis not present

## 2022-02-01 DIAGNOSIS — M79641 Pain in right hand: Secondary | ICD-10-CM | POA: Insufficient documentation

## 2022-02-01 MED ORDER — IBUPROFEN 400 MG PO TABS
600.0000 mg | ORAL_TABLET | Freq: Once | ORAL | Status: AC
  Administered 2022-02-01: 600 mg via ORAL
  Filled 2022-02-01: qty 1

## 2022-02-01 NOTE — Discharge Instructions (Signed)
You were seen in the emergency department today after a fall.  We have placed you in a splint that you will wear for 3 weeks.  You will need to follow-up with a hand surgeon for evaluation after wearing this splint.  I have placed his information in your discharge paperwork.  Please call tomorrow to schedule a follow-up appointment.  Please elevate the hand to reduce swelling.  You may use Tylenol and Motrin as needed for pain relief.  Please return to the emergency department if you are unable to feel your right hand.

## 2022-02-01 NOTE — ED Provider Notes (Signed)
[facility]PART[facility] Provider Note   CSN: [id number] Arrival date & time: 02/01/22  1810     History  Chief Complaint  Patient presents with   Fall   Hand Pain    R    Melanie Moss is a 46 y.o. female.  Past medical history of hypertension, hyperlipidemia, GERD, breast cancer presents emergency department with right hand and arm pain.  Patient states 3 days ago she was walking up the steps to her house when she slipped and fell.  States that she fell forward, catching herself with her right hand.  She states that since then she has had gradually worsening pain to the hand that radiates up the forearm.  She denies numbness or tingling to the hand.  Denies hitting her head or loss of consciousness.  Denies other injuries.   Fall  Hand Pain       Home Medications Prior to Admission medications   Medication Sig Start Date End Date Taking? Authorizing Provider  albuterol (VENTOLIN HFA) 108 (90 Base) MCG/ACT inhaler Inhale 1-2 puffs into the lungs every 6 (six) hours as needed for wheezing or shortness of breath. Patient not taking: Reported on 06/10/2020 05/03/20   Chase Picket, MD  ALPRAZolam Duanne Moron) 0.25 MG tablet Take 0.125 mg by mouth daily as needed. 04/27/21   [provider]  ARIPiprazole (ABILIFY) 10 MG tablet Take 10 mg by mouth daily. 03/20/21   [provider]  B Complex-C (SUPER B COMPLEX PO) Take 1 tablet by mouth daily.    [provider]  benzonatate (TESSALON) 100 MG capsule Take 1 capsule (100 mg total) by mouth every 8 (eight) hours. Patient not taking: Reported on 06/10/2020 05/03/20   Chase Picket, MD  ezetimibe (ZETIA) 10 MG tablet Take 10 mg by mouth at bedtime. Patient not taking: Reported on 06/10/2020 05/26/19   [provider]  FARXIGA 5 MG TABS tablet Take 5 mg by mouth every morning. 05/27/21   [provider]  irbesartan-hydrochlorothiazide (AVALIDE) 150-12.5 MG tablet Take 1 tablet  by mouth daily. Patient not taking: Reported on 06/10/2020    [provider]  letrozole [facility]) 2.5 MG tablet TAKE 1 TABLET BY MOUTH EVERY DAY 01/23/21   Alla Feeling, NP  Mouthwash Compounding Base LIQD Swish and spit 10 mLs every 6 (six) hours as needed. Maalox;80 ml: Lidocaine 2%; 80 ml: Benadryl 12.'5mg'$ /ml; 80 ml Patient not taking: Reported on 06/10/2020 05/03/20   Chase Picket, MD  PARoxetine (PAXIL CR) 25 MG 24 hr tablet Take 1 tablet (25 mg total) by mouth daily. 02/10/20   Lavonia Drafts, MD  potassium chloride (KLOR-CON) 10 MEQ tablet Take 1 tablet (10 mEq total) by mouth 2 (two) times daily. 06/10/20   Truitt Merle, MD  rosuvastatin (CRESTOR) 10 MG tablet Take 10 mg by mouth daily. 04/05/21   [provider]  Vitamin D3 (VITAMIN D) 25 MCG tablet Take 1,000 Units by mouth daily. Patient not taking: Reported on 06/10/2020    [provider]  zolpidem (AMBIEN CR) 6.25 MG CR tablet Take 6.25 mg by mouth at bedtime as needed. 12/29/20   [provider]      Allergies    Patient has no known allergies.    Review of Systems   Review of Systems  Musculoskeletal:  Positive for arthralgias.  All other systems reviewed and are negative.   Physical Exam Updated Vital Signs BP (!) 147/100 (BP Location: Left Arm)  Pulse (!) 107   Temp 98.5 F (36.9 C) (Oral)   Resp 18   Ht '5\' 3"'$  (1.6 m)   Wt 68.5 kg   LMP 04/23/2018 Comment: Hysterectomy 10/28/19  SpO2 93%   BMI 26.75 kg/m  Physical Exam Vitals and nursing note reviewed.  Constitutional:      General: She is not in acute distress.    Appearance: Normal appearance. She is not ill-appearing or toxic-appearing.  HENT:     Head: Normocephalic and atraumatic.  Eyes:     General: No scleral icterus. Cardiovascular:     Pulses: Normal pulses.  Pulmonary:     Effort: Pulmonary effort is normal. No respiratory distress.  Musculoskeletal:        General: Swelling and tenderness present.      Comments: Snuffbox tenderness to the right hand Radial pulse 2+, sensation intact, cap refill less than 2 seconds.  Mild swelling to the right wrist with good range of motion. She is able to oppose the thumb to the pinky slowly, able to cross the index and middle finger and has decreased range of motion to give me a thumbs up.  Skin:    General: Skin is warm and dry.     Capillary Refill: Capillary refill takes less than 2 seconds.     Findings: No bruising, erythema or rash.  Neurological:     General: No focal deficit present.     Mental Status: She is alert and oriented to person, place, and time. Mental status is at baseline.     Sensory: No sensory deficit.  Psychiatric:        Mood and Affect: Mood normal.        Behavior: Behavior normal.        Thought Content: Thought content normal.        Judgment: Judgment normal.     ED Results / Procedures / Treatments   Labs (all labs ordered are listed, but only abnormal results are displayed) Labs Reviewed - No data to display  EKG None  Radiology DG Hand Complete Right  Result Date: 02/01/2022 CLINICAL DATA:  right hand pain post fall; right FA pain post fall EXAM: RIGHT HAND - COMPLETE 3+ VIEW; RIGHT FOREARM - 2 VIEW COMPARISON:  None Available. FINDINGS: Right hand: There is no evidence of fracture or dislocation. There is no evidence of arthropathy or other focal bone abnormality. Soft tissues are unremarkable. Right forearm: There is no evidence of fracture or dislocation. There is no evidence of arthropathy or other focal bone abnormality. Soft tissues are unremarkable. IMPRESSION: No acute displaced fracture or dislocation of the bones of the right hand and forearm. Electronically Signed   By: Iven Finn M.D.   On: 02/01/2022 19:02   DG Forearm Right  Result Date: 02/01/2022 CLINICAL DATA:  right hand pain post fall; right FA pain post fall EXAM: RIGHT HAND - COMPLETE 3+ VIEW; RIGHT FOREARM - 2 VIEW COMPARISON:  None  Available. FINDINGS: Right hand: There is no evidence of fracture or dislocation. There is no evidence of arthropathy or other focal bone abnormality. Soft tissues are unremarkable. Right forearm: There is no evidence of fracture or dislocation. There is no evidence of arthropathy or other focal bone abnormality. Soft tissues are unremarkable. IMPRESSION: No acute displaced fracture or dislocation of the bones of the right hand and forearm. Electronically Signed   By: Iven Finn M.D.   On: 02/01/2022 19:02    Procedures Procedures  Medications Ordered in ED Medications  ibuprofen (ADVIL) tablet 600 mg (600 mg Oral Given 02/01/22 1843)    ED Course/ Medical Decision Making/ A&P                           Medical Decision Making Amount and/or Complexity of Data Reviewed Radiology: ordered.  This patient presents to the ED with chief complaint(s) of right hand pain with pertinent past medical history of hypertension, hyperlipidemia which further complicates the presenting complaint. The complaint involves an extensive differential diagnosis and also carries with it a high risk of complications and morbidity.    The differential diagnosis includes fracture, subluxation, ligamentous or tendinous injury  Additional history obtained: Additional history obtained from  none available Records reviewed Care Everywhere/External Records and Primary Care Documents  ED Course and Reassessment: 46 year old female who presents to the emergency department after mechanical fall and catching herself on her right hand.  On physical exam she has anatomical snuffbox tenderness to palpation.  There is no obvious deformity.  She is neurovascularly intact.  Obtain plain films of the right hand and forearm which shows no fractures, subluxation or other injuries.  Given ibuprofen for pain.  Given the anatomical snuffbox tenderness, placed patient in thumb spica splinting. Instructed that she will wear this  for the next 3 weeks and follow-up with hand surgery.  She verbalized understanding.  Instructed to use RICE therapy in the meantime.  Given return precautions.  She verbalized understanding.  There were no other injuries that require evaluation at this time.  Feel that she is otherwise safe for discharge.  Independent labs interpretation:  The following labs were independently interpreted: Not indicated  Independent visualization of imaging: - I independently visualized the following imaging with scope of interpretation limited to determining acute life threatening conditions related to emergency care: Plain film of the right forearm and hand , which revealed no acute findings  Consultation: - Consulted or discussed management/test interpretation w/ external professional: Not indicated  Consideration for admission or further workup: Not indicated Social Determinants of health: None identified Final Clinical Impression(s) / ED Diagnoses Final diagnoses:  Right hand pain    Rx / DC Orders ED Discharge Orders          Ordered    Ambulatory referral to Hand Surgery        02/01/22 1954              Mickie Hillier, PA-C 02/01/22 1954    Charlesetta Shanks, MD 02/04/22 1303

## 2022-02-01 NOTE — ED Triage Notes (Addendum)
Mechanical fall yesterday. C/o right hand pain that has progressed into wrist and mid forearm. Denies other injuries, loc, hitting head, blood thinners.

## 2022-02-18 ENCOUNTER — Other Ambulatory Visit: Payer: Self-pay | Admitting: Nurse Practitioner

## 2022-02-18 DIAGNOSIS — D500 Iron deficiency anemia secondary to blood loss (chronic): Secondary | ICD-10-CM

## 2022-02-19 ENCOUNTER — Other Ambulatory Visit: Payer: Self-pay

## 2022-02-26 DIAGNOSIS — M79641 Pain in right hand: Secondary | ICD-10-CM | POA: Insufficient documentation

## 2022-04-24 DIAGNOSIS — I10 Essential (primary) hypertension: Secondary | ICD-10-CM | POA: Diagnosis not present

## 2022-04-24 DIAGNOSIS — J208 Acute bronchitis due to other specified organisms: Secondary | ICD-10-CM | POA: Diagnosis not present

## 2022-04-24 DIAGNOSIS — J069 Acute upper respiratory infection, unspecified: Secondary | ICD-10-CM | POA: Diagnosis not present

## 2022-05-31 ENCOUNTER — Encounter: Payer: Self-pay | Admitting: Hematology

## 2022-06-06 NOTE — Progress Notes (Unsigned)
Patient Care Team: Melanie Lei, MD as PCP - General (Family Medicine) Melanie Seltzer, MD (Inactive) as Consulting Physician (General Surgery) Melanie Merle, MD as Consulting Physician (Hematology) Melanie Moss, Melanie Massed, NP as Nurse Practitioner (Hematology and Oncology) Melanie Pray, MD as Consulting Physician (Radiation Oncology)   CHIEF COMPLAINT: Follow up right breast cancer   Oncology History Overview Note  Breast cancer of lower-outer quadrant of right female breast [facility])   Staging form: Breast, AJCC 7th Edition     Clinical stage from 08/03/2015: Stage IA (T1c, N0, M0) - Unsigned     Pathologic stage from 09/29/2015: Stage IIIA (T3(m), N1a, cM0) - Signed by Melanie Merle, MD on 10/17/2015       Breast cancer of lower-outer quadrant of right female breast ([place])  07/25/2015 Mammogram   diagnostic mammogram and ultrasound showed a 11 mm mass in the 8:00 position of the right breast, small irregular lesion in the retroareolar regio of the right breast, contiguous with the dermis   07/26/2015 Initial Diagnosis   Breast cancer of lower-outer quadrant of right female breast ([place])   07/26/2015 Initial Biopsy   right breast needle core biopsy at 8:00 position (1), invasive ductal carcinoma, grade 3, 6:30 o'clock position invasive (2) and in situ ductal carcinoma, grade 1-2, lymphovascular invasion is identified   07/26/2015 Receptors her2   (1) ER 95% positive, PR 95% positive, Ki-67 30%, HER-2 positive with ratio 2.04, copy # 5.20, (2) ER 90% positive, PR 95% positive, Ki-67 10%, HER-2 negative.   08/02/2015 Imaging   breast MRI showed extensive mass  and non-mass enhancement in the lower outer quadrant of the right breast  spanning 13 cm, focal enhancement within the right nipple, at least 3-4 abnormal appearing right axillary lymph node.   09/29/2015 Surgery   right mastectomy and axillar node dissection    09/29/2015 Pathology Results   Right mastectomy and axillary node dissection  showed multifocal mammary carcinoma with ductal and lobular features, tumor size 13 cm, 2.1 cm, 1.9 cm, and 0.5 cm, grade 2-3, anterior margins were positive, 2 nodes with metastasis and one with isolated cells   11/01/2015 - 02/14/2016 Chemotherapy   TCHP every 3 weeks, for 6 cycles, followed by herceptin maintenance therapy for a total of one year    03/06/2016 - 11/01/2016 Antibody Plan   Maintenance Herceptin and perjeta, every 3 weeks   03/07/2016 - 04/27/2016 Radiation Therapy   03/07/16-04/27/16 Site/dose: 1) 41fd right chest wall/ 45 Gy in 25 fractions 2) Right mastectomy scar / 16 Gy in 8 fractions   05/21/2016 -  Anti-estrogen oral therapy   Tamoxifen 20 mg daily starting 05/21/16 Switched to Letrozole 2.568mdaily on 01/17/18.  Started monthyl Zolodex injections on 12/16/17. Last on 10/17/19 as she is s/p BSO on 10/27/19.    10/17/2016 Mammogram   IMPRESSION: No mammographic evidence of malignancy. A result letter of this screening mammogram will be mailed directly to the patient.   10/22/2016 Echocardiogram   Impressions:   - Normal study. Stable GLPSS at -21%. LVEF 60-65%.   11/09/2016 - 11/09/2017 Anti-estrogen oral therapy    Neratinib 24075maily started on 11/09/2016. Completed in one year   02/04/2017 Survivorship   Survivorship Clinic with Melanie PhlegmP   03/11/2017 Surgery   REMOVAL OF RIGHT TISSUE EXPANDERS WITH PLACEMRIGHT SILICONE  BREAST IMPLANTS and left MASTOPEXY FOR SYMENTRY by Dr. DilMarla Roe1/19/18   03/11/2017 Pathology Results   Diagnosis 03/11/17 1. Breast, capsule, right inferior BENIGN  FIBROVASCULAR TISSUE 2. Breast, Mammoplasty, left BENIGN BREAST TISSUE NEGATIVE FOR ATYPIA OR MALIGNANCY   10/27/2019 Surgery   XI ROBOTIC ASSISTED TOTAL HYSTERECTOMY WITH BILATERAL SALPINGO OOPHORECTOMY by Dr Melanie Moss and Dr Melanie Moss   FINAL MICROSCOPIC DIAGNOSIS:   A. UTERUS, CERVIX, BILATERAL TUBES AND OVARIES:  - Uterus with leiomyomata,  largest measuring 2.2 cm  - Benign inactive endometrium  - Adenomyosis  - Benign unremarkable cervix  - Benign unremarkable bilateral fallopian tubes and ovaries  - No evidence of malignancy       CURRENT THERAPY: Letrozole 2.5 mg daily, starting 01/17/2018   INTERVAL HISTORY [name] returns for follow up as scheduled, last seen by me 06/08/21. Left mammo 11/20/21 showed breast density cat C, otherwise negative. Denies concerns in her breast such as new lump/mass, nipple discharge or inversion, or skin change. DEXA 12/18/21 showed mild osteopenia, T score -1.3 in forearm radius but low fracture risk. She takes vitamin D but not calcium.  She is doing well in the last year, mental health has significantly improved after she eventually found the right medication and started BuSpar at the end of last year.  She recently ran out of Paxil which she took at night for hot flashes and was helpful, been off for few weeks without negative changes in her mood but she noticed hot flashes have worsened.  For the past few months she has irregular bowel movements, abdominal bloating, and weight gain.  This is around the time she started BuSpar.  Denies abdominal pain, blood in stool, nausea/vomiting.  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  She is tired but feeling good to be working again.  Also reports having fallen several times in the past 6 months, with no precipitating warning or significant dizziness.  Denies headache, vomiting or other neurochange.   ROS  All other systems reviewed and negative  Past Medical History:  Diagnosis Date   Anemia    Anxiety    Breast cancer of lower-outer quadrant of right female breast ([place]) 07/29/2015   Family history of breast cancer    Family history of colon cancer    Family history of prostate cancer    GERD (gastroesophageal reflux disease)    tums    History of radiation therapy 03/07/16-04/27/16   right chest wall 45 Gy in 25  fractions, right mastectomy scar 16 Gy in 8 fractions   Hyperlipidemia    Hypertension    Lymphedema    Personal history of chemotherapy    Personal history of radiation therapy    Preterm labor      Past Surgical History:  Procedure Laterality Date   AXILLARY LYMPH NODE DISSECTION Right 09/29/2015   Procedure: AXILLARY LYMPH NODE DISSECTION;  Surgeon: Melanie Seltzer, MD;  Location: [place];  Service: General;  Laterality: Right;   BREAST IMPLANT REMOVAL Right 06/01/2017   Procedure: EXPLANT OF RIGHT 480 MENTOR BREAST IMPLANT;  Surgeon: Wallace Going, DO;  Location: [place];  Service: Plastics;  Laterality: Right;   BREAST RECONSTRUCTION Right 08/08/2017   Procedure: RIGHT BREAST RECONSTRUCTION WITH LATISSMA MYOCUTANEOUS FLAP AND EXPANDER PLACEMENT;  Surgeon: Wallace Going, DO;  Location: [place];  Service: Plastics;  Laterality: Right;   BREAST RECONSTRUCTION WITH PLACEMENT OF TISSUE EXPANDER AND FLEX HD (ACELLULAR HYDRATED DERMIS) Right 09/29/2015   Procedure: BREAST RECONSTRUCTION WITH PLACEMENT OF TISSUE EXPANDER AND FLEX HD (ACELLULAR HYDRATED DERMIS);  Surgeon: Loel Lofty Dillingham, DO;  Location: [place];  Service:  Plastics;  Laterality: Right;   CERVICAL CERCLAGE  02/19/2011   Procedure: CERCLAGE CERVICAL;  Surgeon: Agnes Lawrence, MD;  Location: [facility];  Service: Gynecology;  Laterality: N/A;   DILATION AND CURETTAGE OF UTERUS     INCISION AND DRAINAGE OF WOUND Right 04/19/2017   Procedure: IRRIGATION OF RIGHT BREAST POCKET;  Surgeon: Wallace Going, DO;  Location: [place];  Service: Plastics;  Laterality: Right;   LATISSIMUS FLAP TO BREAST Right 08/08/2017   Procedure: LATISSIMUS FLAP TO BREAST;  Surgeon: Wallace Going, DO;  Location: [place];  Service: Plastics;  Laterality: Right;   MASTECTOMY Right 2017   right 2017   MASTECTOMY WITH AXILLARY LYMPH NODE DISSECTION Right 09/29/2015   MASTOPEXY Left 03/11/2017   Procedure: MASTOPEXY FOR  SYMENTRY;  Surgeon: Wallace Going, DO;  Location: [place];  Service: Plastics;  Laterality: Left;   PORT-A-CATH REMOVAL  10/2016   PORTACATH PLACEMENT N/A 09/29/2015   Procedure: INSERTION PORT-A-CATH;  Surgeon: Melanie Seltzer, MD;  Location: [place];  Service: General;  Laterality: N/A;   REMOVAL OF BILATERAL TISSUE EXPANDERS WITH PLACEMENT OF BILATERAL BREAST IMPLANTS Right 03/11/2017   Procedure: REMOVAL OF RIGHT TISSUE EXPANDERS WITH PLACEMRIGHT SILICONE  BREAST IMPLANTS;  Surgeon: Wallace Going, DO;  Location: [place];  Service: Plastics;  Laterality: Right;   REMOVAL OF TISSUE EXPANDER AND PLACEMENT OF IMPLANT Right 10/19/2017   Procedure: REMOVAL OF TISSUE EXPANDER;  Surgeon: Wallace Going, DO;  Location: [place];  Service: Plastics;  Laterality: Right;   ROBOTIC ASSISTED TOTAL HYSTERECTOMY WITH BILATERAL SALPINGO OOPHERECTOMY Bilateral 10/27/2019   Procedure: XI ROBOTIC ASSISTED TOTAL HYSTERECTOMY WITH BILATERAL SALPINGO OOPHORECTOMY;  Surgeon: Lavonia Drafts, MD;  Location: [place];  Service: Gynecology;  Laterality: Bilateral;   SIMPLE MASTECTOMY WITH AXILLARY SENTINEL NODE BIOPSY Right 09/29/2015   Procedure: RIGHT TOTAL  MASTECTOMY WITH AXILLARY SENTINEL NODE BIOPSY;  Surgeon: Melanie Seltzer, MD;  Location: [place];  Service: General;  Laterality: Right;     Outpatient Encounter Medications as of 06/07/2022  Medication Sig   busPIRone (BUSPAR) 10 MG tablet Take 10 mg by mouth 3 (three) times daily.   ALPRAZolam (XANAX) 0.25 MG tablet Take 0.125 mg by mouth daily as needed.   ARIPiprazole (ABILIFY) 10 MG tablet Take 10 mg by mouth daily.   B Complex-C (SUPER B COMPLEX PO) Take 1 tablet by mouth daily.   FARXIGA 5 MG TABS tablet Take 5 mg by mouth every morning.   letrozole (FEMARA) 2.5 MG tablet TAKE 1 TABLET BY MOUTH EVERY DAY   PARoxetine (PAXIL CR) 25 MG 24 hr tablet Take 1 tablet (25 mg total) by mouth  daily.   Vitamin D3 (VITAMIN D) 25 MCG tablet Take 1,000 Units by mouth daily. (Patient not taking: Reported on 06/10/2020)   [DISCONTINUED] albuterol (VENTOLIN HFA) 108 (90 Base) MCG/ACT inhaler Inhale 1-2 puffs into the lungs every 6 (six) hours as needed for wheezing or shortness of breath. (Patient not taking: Reported on 06/10/2020)   [DISCONTINUED] benzonatate (TESSALON) 100 MG capsule Take 1 capsule (100 mg total) by mouth every 8 (eight) hours. (Patient not taking: Reported on 06/10/2020)   [DISCONTINUED] ezetimibe (ZETIA) 10 MG tablet Take 10 mg by mouth at bedtime. (Patient not taking: Reported on 06/10/2020)   [DISCONTINUED] irbesartan-hydrochlorothiazide (AVALIDE) 150-12.5 MG tablet Take 1 tablet by mouth daily. (Patient not taking: Reported on 06/10/2020)   [DISCONTINUED] Mouthwash Compounding Base LIQD Swish and spit  10 mLs every 6 (six) hours as needed. Maalox;80 ml: Lidocaine 2%; 80 ml: Benadryl 12.41m/ml; 80 ml (Patient not taking: Reported on 06/10/2020)   [DISCONTINUED] potassium chloride (KLOR-CON) 10 MEQ tablet Take 1 tablet (10 mEq total) by mouth 2 (two) times daily.   [DISCONTINUED] rosuvastatin (CRESTOR) 10 MG tablet Take 10 mg by mouth daily.   [DISCONTINUED] zolpidem (AMBIEN CR) 6.25 MG CR tablet Take 6.25 mg by mouth at bedtime as needed.   [DISCONTINUED] 0.9 %  sodium chloride infusion    [DISCONTINUED] alteplase (CATHFLO ACTIVASE) injection 2 mg    [DISCONTINUED] heparin lock flush 100 unit/mL    [DISCONTINUED] heparin lock flush 100 unit/mL    [DISCONTINUED] sodium chloride 0.9 % injection 10 mL    [DISCONTINUED] sodium chloride 0.9 % injection 10 mL    [DISCONTINUED] sodium chloride 0.9 % injection 3 mL    [DISCONTINUED] sodium chloride flush (NS) 0.9 % injection 10 mL    No facility-administered encounter medications on file as of 06/07/2022.     Today's Vitals   06/07/22 0915  BP: (!) 131/98  Pulse: 91  Resp: 20  Temp: (!) 97.5 F (36.4 C)  SpO2: 98%  Weight:  166 lb 6.4 oz (75.5 kg)   Body mass index is 29.48 kg/m.   PHYSICAL EXAM GENERAL:alert, no distress and comfortable SKIN: no rash  EYES: sclera clear NECK: without mass LYMPH:  no palpable cervical or supraclavicular lymphadenopathy  LUNGS: normal breathing effort HEART: regular rate & rhythm, no lower extremity edema ABDOMEN: abdomen soft, non-tender and normal bowel sounds NEURO: alert & oriented x 3 with fluent speech, no focal motor/sensory deficits Breast exam: S/p right mastectomy, incisions completely healed.  No left nipple discharge or inversion.  No palpable mass or nodularity along the right chest wall, left breast, or either axilla that I could appreciate   CBC    Component Value Date/Time   WBC 6.4 06/07/2022 0905   WBC 8.1 12/09/2020 0856   RBC 4.41 06/07/2022 0905   HGB 12.7 06/07/2022 0905   HGB 10.5 (L) 04/04/2017 0925   HCT 37.8 06/07/2022 0905   HCT 31.3 (L) 04/04/2017 0925   PLT 312 06/07/2022 0905   PLT 291 04/04/2017 0925   MCV 85.7 06/07/2022 0905   MCV 92.0 04/04/2017 0925   MCH 28.8 06/07/2022 0905   MCHC 33.6 06/07/2022 0905   RDW 12.8 06/07/2022 0905   RDW 13.8 04/04/2017 0925   LYMPHSABS 3.3 06/07/2022 0905   LYMPHSABS 1.7 04/04/2017 0925   MONOABS 0.4 06/07/2022 0905   MONOABS 0.4 04/04/2017 0925   EOSABS 0.1 06/07/2022 0905   EOSABS 0.4 04/04/2017 0925   BASOSABS 0.0 06/07/2022 0905   BASOSABS 0.0 04/04/2017 0925     CMP     Component Value Date/Time   NA 140 06/08/2021 0919   NA 141 04/04/2017 0925   K 3.7 06/08/2021 0919   K 3.8 04/04/2017 0925   CL 105 06/08/2021 0919   CO2 28 06/08/2021 0919   CO2 28 04/04/2017 0925   GLUCOSE 104 (H) 06/08/2021 0919   GLUCOSE 92 04/04/2017 0925   BUN 11 06/08/2021 0919   BUN 11.3 04/04/2017 0925   CREATININE 0.93 06/08/2021 0919   CREATININE 1.0 04/04/2017 0925   CALCIUM 9.8 06/08/2021 0919   CALCIUM 9.7 04/04/2017 0925   PROT 7.6 06/08/2021 0919   PROT 7.2 04/04/2017 0925   ALBUMIN  4.6 06/08/2021 0919   ALBUMIN 3.6 04/04/2017 0925   AST 27 06/08/2021 0919  AST 41 (H) 04/04/2017 0925   ALT 18 06/08/2021 0919   ALT 53 04/04/2017 0925   ALKPHOS 87 06/08/2021 0919   ALKPHOS 56 04/04/2017 0925   BILITOT 0.6 06/08/2021 0919   BILITOT 0.39 04/04/2017 0925   GFRNONAA >60 06/08/2021 0919   GFRAA >60 12/07/2019 0823     ASSESSMENT & PLAN:Melanie Moss is a 47 y.o. female with    1. 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  S/p right mastectomy and flap reconstruction, adjuvant chemo, radiation, and HER2 antibodies. -She started antiestrogen therapy with Tamoxifen in 04/2016 and was switched to Letrozole on 01/17/2018 with ovarian suppression. S/p BSO 10/2019 -Last mammogram 10/2021 negative, breast density cat C -Ms. Brenneman is clinically doing well from a breast cancer standpoint.  Tolerating letrozole with hot flashes, exam is unremarkable, labs are stable.  Overall there is no clinical concern for recurrence. -Continue breast cancer surveillance and letrozole for total of 7-10 years, refilled -Next mammogram 10/2022, then annual follow-up -Reviewed signs and symptoms of recurrence.  - Encouraged her to continue healthy active lifestyle.  She has had a few falls recently, no significant neuro red flags.  She will monitor   2.  Anxiety and depression  -Managed at [facility], currently on Xanax, Abilify, Ambien, and Paxil (for hot flashes) -She has struggled with this, but appears much better after starting BuSpar in late 2023 -Continue psych with Ladon Applebaum at [facility] -She ran out of paxil a few weeks ago, the ob/gyn who prescribed is no longer working. Taking 25 mg po at night which was helpful for hot flashes. - Will see if Dr. Redmond Baseman is OK to restart this  3.  Irregular bowel habits, abdominal bloating, weight gain -Onset possibly with starting BuSpar -Exam is benign -I recommend gentle stool softener or laxative, hydrate -She is overdue for screening colonoscopy, she will check which practice is covered by her insurance and let me know where to refer her   4. Hot flashes, osteopenia -Secondary to AI and BSO -She is on Paxil which helped her the most, ran out a few weeks ago.  Will check with psych to see if we can safely restart -Her latest DEXA from 8/202 showed osteopenia with lowest T-score -1.3 at radius, low FRAX score  -Continue vitamin D, add calcium, and increase weightbearing    5. Iron deficient anemia, and anemia of chronic disease  -resolved with hysterectomy 10/27/19       PLAN: -2023 mammogram and DEXA, and today's labs reviewed -Continue breast cancer surveillance and letrozole -GI referral for change in bowel habits and abdominal bloating, overdue for for screening colonoscopy, (she will send a MyChart message with practice of choice) -Add calcium, continue vitamin D and weightbearing exercise for osteopenia -Continue mental health and PCP follow-up.  Will check with Dr. Redmond Baseman to see if pt can restart Paxil (25 mg po qHS) for hot flashes -Next mammogram 11/22/2022 -Follow-up in 1 year, or sooner if needed   Orders Placed This Encounter  Procedures   MM 3D SCREEN BREAST UNI LEFT    Standing Status:   Future    Standing Expiration Date:   06/08/2023    Order Specific Question:   Reason for Exam (SYMPTOM  OR DIAGNOSIS REQUIRED)    Answer:   h/o R breast cancer s/p mastectomy  Order Specific Question:   Is the patient pregnant?    Answer:   No    Order Specific Question:   Preferred imaging location?    Answer:   [facility]      All questions were answered. The patient knows to call the clinic with any problems, questions or concerns. No barriers to learning were detected. I spent 20 minutes counseling the patient  face to face. The total time spent in the appointment was 30 minutes and more than 50% was on counseling, review of test results, and coordination of care.   Cira Rue, NP-C 06/07/2022

## 2022-06-07 ENCOUNTER — Encounter: Payer: Self-pay | Admitting: Hematology

## 2022-06-07 ENCOUNTER — Other Ambulatory Visit: Payer: Self-pay

## 2022-06-07 ENCOUNTER — Encounter: Payer: Self-pay | Admitting: Nurse Practitioner

## 2022-06-07 ENCOUNTER — Inpatient Hospital Stay: Payer: 59 | Attending: Nurse Practitioner

## 2022-06-07 ENCOUNTER — Inpatient Hospital Stay (HOSPITAL_BASED_OUTPATIENT_CLINIC_OR_DEPARTMENT_OTHER): Payer: 59 | Admitting: Nurse Practitioner

## 2022-06-07 VITALS — BP 131/98 | HR 91 | Temp 97.5°F | Resp 20 | Wt 166.4 lb

## 2022-06-07 DIAGNOSIS — Z90710 Acquired absence of both cervix and uterus: Secondary | ICD-10-CM | POA: Insufficient documentation

## 2022-06-07 DIAGNOSIS — F32A Depression, unspecified: Secondary | ICD-10-CM | POA: Diagnosis not present

## 2022-06-07 DIAGNOSIS — Z9221 Personal history of antineoplastic chemotherapy: Secondary | ICD-10-CM | POA: Insufficient documentation

## 2022-06-07 DIAGNOSIS — Z170 Estrogen receptor positive status [ER+]: Secondary | ICD-10-CM | POA: Diagnosis not present

## 2022-06-07 DIAGNOSIS — M8580 Other specified disorders of bone density and structure, unspecified site: Secondary | ICD-10-CM | POA: Diagnosis not present

## 2022-06-07 DIAGNOSIS — Z79899 Other long term (current) drug therapy: Secondary | ICD-10-CM | POA: Diagnosis not present

## 2022-06-07 DIAGNOSIS — Z79811 Long term (current) use of aromatase inhibitors: Secondary | ICD-10-CM | POA: Insufficient documentation

## 2022-06-07 DIAGNOSIS — I10 Essential (primary) hypertension: Secondary | ICD-10-CM | POA: Insufficient documentation

## 2022-06-07 DIAGNOSIS — Z9011 Acquired absence of right breast and nipple: Secondary | ICD-10-CM | POA: Diagnosis not present

## 2022-06-07 DIAGNOSIS — C50511 Malignant neoplasm of lower-outer quadrant of right female breast: Secondary | ICD-10-CM | POA: Diagnosis not present

## 2022-06-07 DIAGNOSIS — Z90722 Acquired absence of ovaries, bilateral: Secondary | ICD-10-CM | POA: Diagnosis not present

## 2022-06-07 DIAGNOSIS — Z923 Personal history of irradiation: Secondary | ICD-10-CM | POA: Diagnosis not present

## 2022-06-07 LAB — CBC WITH DIFFERENTIAL (CANCER CENTER ONLY)
Abs Immature Granulocytes: 0.01 10*3/uL (ref 0.00–0.07)
Basophils Absolute: 0 10*3/uL (ref 0.0–0.1)
Basophils Relative: 1 %
Eosinophils Absolute: 0.1 10*3/uL (ref 0.0–0.5)
Eosinophils Relative: 2 %
HCT: 37.8 % (ref 36.0–46.0)
Hemoglobin: 12.7 g/dL (ref 12.0–15.0)
Immature Granulocytes: 0 %
Lymphocytes Relative: 50 %
Lymphs Abs: 3.3 10*3/uL (ref 0.7–4.0)
MCH: 28.8 pg (ref 26.0–34.0)
MCHC: 33.6 g/dL (ref 30.0–36.0)
MCV: 85.7 fL (ref 80.0–100.0)
Monocytes Absolute: 0.4 10*3/uL (ref 0.1–1.0)
Monocytes Relative: 6 %
Neutro Abs: 2.6 10*3/uL (ref 1.7–7.7)
Neutrophils Relative %: 41 %
Platelet Count: 312 10*3/uL (ref 150–400)
RBC: 4.41 MIL/uL (ref 3.87–5.11)
RDW: 12.8 % (ref 11.5–15.5)
WBC Count: 6.4 10*3/uL (ref 4.0–10.5)
nRBC: 0 % (ref 0.0–0.2)

## 2022-06-07 LAB — CMP (CANCER CENTER ONLY)
ALT: 16 U/L (ref 0–44)
AST: 25 U/L (ref 15–41)
Albumin: 4.7 g/dL (ref 3.5–5.0)
Alkaline Phosphatase: 78 U/L (ref 38–126)
Anion gap: 11 (ref 5–15)
BUN: 15 mg/dL (ref 6–20)
CO2: 27 mmol/L (ref 22–32)
Calcium: 10 mg/dL (ref 8.9–10.3)
Chloride: 100 mmol/L (ref 98–111)
Creatinine: 0.91 mg/dL (ref 0.44–1.00)
GFR, Estimated: >60 mL/min (ref >60)
Glucose, Bld: 107 mg/dL — ABNORMAL HIGH (ref 70–99)
Potassium: 3.3 mmol/L — ABNORMAL LOW (ref 3.5–5.1)
Sodium: 138 mmol/L (ref 135–145)
Total Bilirubin: 0.8 mg/dL (ref 0.3–1.2)
Total Protein: 8.6 g/dL — ABNORMAL HIGH (ref 6.5–8.1)

## 2022-06-14 ENCOUNTER — Other Ambulatory Visit: Payer: Self-pay | Admitting: Nurse Practitioner

## 2022-06-14 DIAGNOSIS — Z1211 Encounter for screening for malignant neoplasm of colon: Secondary | ICD-10-CM

## 2022-06-15 ENCOUNTER — Encounter: Payer: Self-pay | Admitting: Gastroenterology

## 2022-06-15 ENCOUNTER — Encounter: Payer: Self-pay | Admitting: Hematology

## 2022-06-21 ENCOUNTER — Ambulatory Visit (AMBULATORY_SURGERY_CENTER): Payer: Medicaid Other

## 2022-06-21 VITALS — Ht 63.0 in | Wt 162.0 lb

## 2022-06-21 DIAGNOSIS — Z1211 Encounter for screening for malignant neoplasm of colon: Secondary | ICD-10-CM

## 2022-06-21 DIAGNOSIS — Z800 Family history of malignant neoplasm of digestive organs: Secondary | ICD-10-CM

## 2022-06-21 MED ORDER — PEG 3350-KCL-NA BICARB-NACL 420 G PO SOLR: 4000.0000 mL | Freq: Once | ORAL | 0 refills | Status: AC

## 2022-06-21 NOTE — Progress Notes (Signed)
No egg or soy allergy known to patient   No issues known to pt with past sedation with any surgeries or procedures  Patient denies ever being told they had issues or difficulty with intubation   No FH of Malignant Hyperthermia  Pt is not on diet pills  Pt is not on  home 02   Pt is not on blood thinners   Pt denies issues with constipation   No A fib or A flutter  Have any cardiac testing pending--no Pt instructed to use [email or web address] or GoodRx for a price reduction on prep   

## 2022-07-06 ENCOUNTER — Encounter: Payer: Self-pay | Admitting: Gastroenterology

## 2022-07-06 ENCOUNTER — Ambulatory Visit (AMBULATORY_SURGERY_CENTER): Payer: 59 | Admitting: Gastroenterology

## 2022-07-06 VITALS — BP 116/89 | HR 72 | Temp 97.7°F | Resp 21 | Ht 63.0 in | Wt 162.0 lb

## 2022-07-06 DIAGNOSIS — D125 Benign neoplasm of sigmoid colon: Secondary | ICD-10-CM

## 2022-07-06 DIAGNOSIS — F32A Depression, unspecified: Secondary | ICD-10-CM | POA: Diagnosis not present

## 2022-07-06 DIAGNOSIS — Z1211 Encounter for screening for malignant neoplasm of colon: Secondary | ICD-10-CM | POA: Diagnosis not present

## 2022-07-06 DIAGNOSIS — E785 Hyperlipidemia, unspecified: Secondary | ICD-10-CM | POA: Diagnosis not present

## 2022-07-06 DIAGNOSIS — F419 Anxiety disorder, unspecified: Secondary | ICD-10-CM | POA: Diagnosis not present

## 2022-07-06 DIAGNOSIS — I10 Essential (primary) hypertension: Secondary | ICD-10-CM | POA: Diagnosis not present

## 2022-07-06 MED ORDER — SODIUM CHLORIDE 0.9 % IV SOLN: 500.0000 mL | Freq: Once | INTRAVENOUS | Status: DC

## 2022-07-06 NOTE — Op Note (Signed)
[place] [place] Patient Name: Tytana Singleterry Procedure Date: 07/06/2022 10:03 AM MRN: [id number] Endoscopist: [place]. Loletha Carrow , MD, [id number] Age: 47 Referring MD:  Date of Birth: 1976/01/01 Gender: Female Account #: [id number] Procedure:                Colonoscopy Indications:              Screening for colorectal malignant neoplasm, This                            is the patient's first colonoscopy Medicines:                Monitored Anesthesia Care Procedure:                Pre-Anesthesia Assessment:                           - Prior to the procedure, a History and Physical                            was performed, and patient medications and                            allergies were reviewed. The patient's tolerance of                            previous anesthesia was also reviewed. The risks                            and benefits of the procedure and the sedation                            options and risks were discussed with the patient.                            All questions were answered, and informed consent                            was obtained. Prior Anticoagulants: The patient has                            taken no anticoagulant or antiplatelet agents. ASA                            Grade Assessment: II - A patient with mild systemic                            disease. After reviewing the risks and benefits,                            the patient was deemed in satisfactory condition to                            undergo the procedure.  After obtaining informed consent, the colonoscope                            was passed under direct vision. Throughout the                            procedure, the patient's blood pressure, pulse, and                            oxygen saturations were monitored continuously. The                            CF [id number] was introduced through the anus                            and advanced to the  the cecum, identified by                            appendiceal orifice and ileocecal valve. The                            colonoscopy was performed without difficulty. The                            patient tolerated the procedure well. The quality                            of the bowel preparation was good. The ileocecal                            valve, appendiceal orifice, and rectum were                            photographed. The bowel preparation used was SUPREP                            via split dose instruction. Scope In: 10:13:21 AM Scope Out: 10:27:21 AM Scope Withdrawal Time: 0 hours 10 minutes 50 seconds  Total Procedure Duration: 0 hours 14 minutes 0 seconds  Findings:                 The perianal and digital rectal examinations were                            normal.                           Repeat examination of right colon under NBI                            performed.                           An 8 mm polyp was found in the recto-sigmoid colon.  The polyp was semi-pedunculated. The polyp was                            removed with a hot snare. Resection and retrieval                            were complete.                           Internal hemorrhoids were found. The hemorrhoids                            were small.                           The exam was otherwise without abnormality on                            direct and retroflexion views. Complications:            No immediate complications. Estimated Blood Loss:     Estimated blood loss: none. Impression:               - One 8 mm polyp at the recto-sigmoid colon,                            removed with a hot snare. Resected and retrieved.                           - Internal hemorrhoids.                           - The examination was otherwise normal on direct                            and retroflexion views. Recommendation:           - Patient has a contact number  available for                            emergencies. The signs and symptoms of potential                            delayed complications were discussed with the                            patient. Return to normal activities tomorrow.                            Written discharge instructions were provided to the                            patient.                           - Resume previous diet.                           -  Continue present medications.                           - Await pathology results.                           - Repeat colonoscopy is recommended for                            surveillance. The colonoscopy date will be                            determined after pathology results from today's                            exam become available for review. Cerria Randhawa L. Loletha Carrow, MD 07/06/2022 10:30:31 AM This report has been signed electronically.

## 2022-07-06 NOTE — Progress Notes (Signed)
Sedate, gd SR, tolerated procedure well, VSS, report to RN 

## 2022-07-06 NOTE — Progress Notes (Signed)
History and Physical:  This patient presents for endoscopic testing for: Encounter Diagnosis  Name Primary?   Special screening for malignant neoplasms, colon Yes    Average risk - first screening exam. Patient denies chronic abdominal pain, rectal bleeding, constipation or diarrhea.   Patient is otherwise without complaints or active issues today.   Past Medical History: Past Medical History:  Diagnosis Date   Anemia    Anxiety    Blood transfusion without reported diagnosis    Breast cancer of lower-outer quadrant of right female breast ([place]) 07/29/2015   Depression    Family history of breast cancer    Family history of colon cancer    Family history of prostate cancer    GERD (gastroesophageal reflux disease)    tums    History of radiation therapy 03/07/16-04/27/16   right chest wall 45 Gy in 25 fractions, right mastectomy scar 16 Gy in 8 fractions   Hyperlipidemia    Hypertension    Lymphedema    Personal history of chemotherapy    Personal history of radiation therapy    Preterm labor      Past Surgical History: Past Surgical History:  Procedure Laterality Date   AXILLARY LYMPH NODE DISSECTION Right 09/29/2015   Procedure: AXILLARY LYMPH NODE DISSECTION;  Surgeon: Excell Seltzer, MD;  Location: [place];  Service: General;  Laterality: Right;   BREAST IMPLANT REMOVAL Right 06/01/2017   Procedure: EXPLANT OF RIGHT 480 MENTOR BREAST IMPLANT;  Surgeon: Wallace Going, DO;  Location: [place];  Service: Plastics;  Laterality: Right;   BREAST RECONSTRUCTION Right 08/08/2017   Procedure: RIGHT BREAST RECONSTRUCTION WITH LATISSMA MYOCUTANEOUS FLAP AND EXPANDER PLACEMENT;  Surgeon: Wallace Going, DO;  Location: [place];  Service: Plastics;  Laterality: Right;   BREAST RECONSTRUCTION WITH PLACEMENT OF TISSUE EXPANDER AND FLEX HD (ACELLULAR HYDRATED DERMIS) Right 09/29/2015   Procedure: BREAST RECONSTRUCTION WITH PLACEMENT OF TISSUE EXPANDER AND FLEX HD (ACELLULAR HYDRATED  DERMIS);  Surgeon: Loel Lofty Dillingham, DO;  Location: [place];  Service: Plastics;  Laterality: Right;   CERVICAL CERCLAGE  02/19/2011   Procedure: CERCLAGE CERVICAL;  Surgeon: Agnes Lawrence, MD;  Location: [facility];  Service: Gynecology;  Laterality: N/A;   DILATION AND CURETTAGE OF UTERUS     INCISION AND DRAINAGE OF WOUND Right 04/19/2017   Procedure: IRRIGATION OF RIGHT BREAST POCKET;  Surgeon: Wallace Going, DO;  Location: [place];  Service: Plastics;  Laterality: Right;   LATISSIMUS FLAP TO BREAST Right 08/08/2017   Procedure: LATISSIMUS FLAP TO BREAST;  Surgeon: Wallace Going, DO;  Location: [place];  Service: Plastics;  Laterality: Right;   MASTECTOMY Right 2017   right 2017   MASTECTOMY WITH AXILLARY LYMPH NODE DISSECTION Right 09/29/2015   MASTOPEXY Left 03/11/2017   Procedure: MASTOPEXY FOR SYMENTRY;  Surgeon: Wallace Going, DO;  Location: [place];  Service: Plastics;  Laterality: Left;   PORT-A-CATH REMOVAL  10/2016   PORTACATH PLACEMENT N/A 09/29/2015   Procedure: INSERTION PORT-A-CATH;  Surgeon: Excell Seltzer, MD;  Location: [place];  Service: General;  Laterality: N/A;   REMOVAL OF BILATERAL TISSUE EXPANDERS WITH PLACEMENT OF BILATERAL BREAST IMPLANTS Right 03/11/2017   Procedure: REMOVAL OF RIGHT TISSUE EXPANDERS WITH PLACEMRIGHT SILICONE  BREAST IMPLANTS;  Surgeon: Wallace Going, DO;  Location: [place];  Service: Plastics;  Laterality: Right;   REMOVAL OF TISSUE EXPANDER AND PLACEMENT OF IMPLANT Right 10/19/2017   Procedure: REMOVAL OF TISSUE  EXPANDER;  Surgeon: Wallace Going, DO;  Location: [place];  Service: Plastics;  Laterality: Right;   ROBOTIC ASSISTED TOTAL HYSTERECTOMY WITH BILATERAL SALPINGO OOPHERECTOMY Bilateral 10/27/2019   Procedure: XI ROBOTIC ASSISTED TOTAL HYSTERECTOMY WITH BILATERAL SALPINGO OOPHORECTOMY;  Surgeon: Lavonia Drafts, MD;  Location: [place];  Service: Gynecology;  Laterality: Bilateral;   SIMPLE MASTECTOMY WITH AXILLARY SENTINEL NODE BIOPSY Right 09/29/2015   Procedure: RIGHT TOTAL  MASTECTOMY WITH AXILLARY SENTINEL NODE BIOPSY;  Surgeon: Excell Seltzer, MD;  Location: [place];  Service: General;  Laterality: Right;    Allergies: No Known Allergies  Outpatient Meds: Current Outpatient Medications  Medication Sig Dispense Refill   B Complex-C (SUPER B COMPLEX PO) Take 1 tablet by mouth daily.     busPIRone (BUSPAR) 10 MG tablet Take 10 mg by mouth 3 (three) times daily.     FARXIGA 5 MG TABS tablet Take 5 mg by mouth every morning.     irbesartan-hydrochlorothiazide (AVALIDE) 150-12.5 MG tablet Take 1 tablet by mouth daily.     letrozole (FEMARA) 2.5 MG tablet TAKE 1 TABLET BY MOUTH EVERY DAY 90 tablet 3   ALPRAZolam (XANAX) 0.25 MG tablet Take 0.125 mg by mouth daily as needed. (Patient not taking: Reported on 06/21/2022)     ARIPiprazole (ABILIFY) 10 MG tablet Take 10 mg by mouth daily. (Patient not taking: Reported on 06/21/2022)     PARoxetine (PAXIL CR) 25 MG 24 hr tablet Take 1 tablet (25 mg total) by mouth daily. (Patient not taking: Reported on 06/21/2022) 90 tablet 12   Vitamin D3 (VITAMIN D) 25 MCG tablet Take 1,000 Units by mouth daily.     Current Facility-Administered Medications  Medication Dose Route Frequency Provider Last Rate Last Admin   0.9 %  sodium chloride infusion  500 mL Intravenous Once Danis, Estill Cotta III, MD          ___________________________________________________________________ Objective   Exam:  BP (!) 142/101   Pulse 85   Temp 97.7 F (36.5 C) (Temporal)   Ht 5\' 3"  (1.6 m)   Wt 162 lb (73.5 kg)   LMP 04/23/2018 Comment: Hysterectomy 10/28/19  SpO2 99%   BMI 28.70 kg/m   CV: regular , S1/S2 Resp: clear to auscultation bilaterally, normal RR and effort noted GI: soft, no tenderness, with active bowel sounds.   Assessment: Encounter Diagnosis  Name Primary?   Special  screening for malignant neoplasms, colon Yes     Plan: Colonoscopy  The benefits and risks of the planned procedure were described in detail with the patient or (when appropriate) their health care proxy.  Risks were outlined as including, but not limited to, bleeding, infection, perforation, adverse medication reaction leading to cardiac or pulmonary decompensation, pancreatitis (if ERCP).  The limitation of incomplete mucosal visualization was also discussed.  No guarantees or warranties were given.    The patient is appropriate for an endoscopic procedure in the ambulatory setting.   - Wilfrid Lund, MD

## 2022-07-06 NOTE — Progress Notes (Signed)
VS completed by AS. ? ?Pt's states no medical or surgical changes since previsit or office visit. ? ?

## 2022-07-06 NOTE — Patient Instructions (Signed)
Handouts Provided:  Polyps  YOU HAD AN ENDOSCOPIC PROCEDURE TODAY AT THE [facility]:   Refer to the procedure report that was given to you for any specific questions about what was found during the examination.  If the procedure report does not answer your questions, please call your gastroenterologist to clarify.  If you requested that your care partner not be given the details of your procedure findings, then the procedure report has been included in a sealed envelope for you to review at your convenience later.  YOU SHOULD EXPECT: Some feelings of bloating in the abdomen. Passage of more gas than usual.  Walking can help get rid of the air that was put into your GI tract during the procedure and reduce the bloating. If you had a lower endoscopy (such as a colonoscopy or flexible sigmoidoscopy) you may notice spotting of blood in your stool or on the toilet paper. If you underwent a bowel prep for your procedure, you may not have a normal bowel movement for a few days.  Please Note:  You might notice some irritation and congestion in your nose or some drainage.  This is from the oxygen used during your procedure.  There is no need for concern and it should clear up in a day or so.  SYMPTOMS TO REPORT IMMEDIATELY:  Following lower endoscopy (colonoscopy or flexible sigmoidoscopy):  Excessive amounts of blood in the stool  Significant tenderness or worsening of abdominal pains  Swelling of the abdomen that is new, acute  Fever of 100F or higher  For urgent or emergent issues, a gastroenterologist can be reached at any hour by calling [phone]. Do not use MyChart messaging for urgent concerns.    DIET:  We do recommend a small meal at first, but then you may proceed to your regular diet.  Drink plenty of fluids but you should avoid alcoholic beverages for 24 hours.  ACTIVITY:  You should plan to take it easy for the rest of today and you should NOT DRIVE or use heavy  machinery until tomorrow (because of the sedation medicines used during the test).    FOLLOW UP: Our staff will call the number listed on your records the next business day following your procedure.  We will call around 7:15- 8:00 am to check on you and address any questions or concerns that you may have regarding the information given to you following your procedure. If we do not reach you, we will leave a message.     If any biopsies were taken you will be contacted by phone or by letter within the next 1-3 weeks.  Please call us at [phone] if you have not heard about the biopsies in 3 weeks.    SIGNATURES/CONFIDENTIALITY: You and/or your care partner have signed paperwork which will be entered into your electronic medical record.  These signatures attest to the fact that that the information above on your After Visit Summary has been reviewed and is understood.  Full responsibility of the confidentiality of this discharge information lies with you and/or your care-partner.  

## 2022-07-09 ENCOUNTER — Telehealth: Payer: Self-pay

## 2022-07-09 NOTE — Telephone Encounter (Signed)
  Follow up Call-     07/06/2022    8:41 AM  Call back number  Post procedure Call Back phone  # [phone]  Permission to leave phone message Yes     Patient questions:  Do you have a fever, pain , or abdominal swelling? No. Pain Score  0 *  Have you tolerated food without any problems? Yes.    Have you been able to return to your normal activities? Yes.    Do you have any questions about your discharge instructions: Diet   No. Medications  No. Follow up visit  No.  Do you have questions or concerns about your Care? No.  Actions: * If pain score is 4 or above: No action needed, pain <4.

## 2022-07-10 ENCOUNTER — Encounter: Payer: Self-pay | Admitting: Gastroenterology

## 2022-07-12 ENCOUNTER — Encounter: Payer: 59 | Admitting: Gastroenterology

## 2022-07-26 ENCOUNTER — Encounter: Payer: 59 | Admitting: Gastroenterology

## 2022-08-17 ENCOUNTER — Encounter: Payer: Self-pay | Admitting: Nurse Practitioner

## 2022-09-10 DIAGNOSIS — M25571 Pain in right ankle and joints of right foot: Secondary | ICD-10-CM | POA: Diagnosis not present

## 2022-10-09 DIAGNOSIS — R7303 Prediabetes: Secondary | ICD-10-CM | POA: Diagnosis not present

## 2022-10-09 DIAGNOSIS — I10 Essential (primary) hypertension: Secondary | ICD-10-CM | POA: Diagnosis not present

## 2022-10-09 DIAGNOSIS — Z1211 Encounter for screening for malignant neoplasm of colon: Secondary | ICD-10-CM | POA: Diagnosis not present

## 2022-10-09 DIAGNOSIS — F332 Major depressive disorder, recurrent severe without psychotic features: Secondary | ICD-10-CM | POA: Diagnosis not present

## 2022-10-09 DIAGNOSIS — Z114 Encounter for screening for human immunodeficiency virus [HIV]: Secondary | ICD-10-CM | POA: Diagnosis not present

## 2022-10-09 DIAGNOSIS — Z1331 Encounter for screening for depression: Secondary | ICD-10-CM | POA: Diagnosis not present

## 2022-10-09 DIAGNOSIS — Z1231 Encounter for screening mammogram for malignant neoplasm of breast: Secondary | ICD-10-CM | POA: Diagnosis not present

## 2022-10-09 DIAGNOSIS — R519 Headache, unspecified: Secondary | ICD-10-CM | POA: Diagnosis not present

## 2022-10-09 DIAGNOSIS — E782 Mixed hyperlipidemia: Secondary | ICD-10-CM | POA: Diagnosis not present

## 2022-10-09 DIAGNOSIS — Z1159 Encounter for screening for other viral diseases: Secondary | ICD-10-CM | POA: Diagnosis not present

## 2022-10-09 DIAGNOSIS — R42 Dizziness and giddiness: Secondary | ICD-10-CM | POA: Diagnosis not present

## 2022-10-30 DIAGNOSIS — H538 Other visual disturbances: Secondary | ICD-10-CM | POA: Diagnosis not present

## 2022-10-30 DIAGNOSIS — I10 Essential (primary) hypertension: Secondary | ICD-10-CM | POA: Diagnosis not present

## 2022-10-30 DIAGNOSIS — E669 Obesity, unspecified: Secondary | ICD-10-CM | POA: Diagnosis not present

## 2022-10-30 DIAGNOSIS — F411 Generalized anxiety disorder: Secondary | ICD-10-CM | POA: Diagnosis not present

## 2022-10-30 DIAGNOSIS — F332 Major depressive disorder, recurrent severe without psychotic features: Secondary | ICD-10-CM | POA: Diagnosis not present

## 2022-11-14 DIAGNOSIS — F411 Generalized anxiety disorder: Secondary | ICD-10-CM | POA: Diagnosis not present

## 2022-11-14 DIAGNOSIS — Z1331 Encounter for screening for depression: Secondary | ICD-10-CM | POA: Diagnosis not present

## 2022-11-14 DIAGNOSIS — F332 Major depressive disorder, recurrent severe without psychotic features: Secondary | ICD-10-CM | POA: Diagnosis not present

## 2022-11-14 DIAGNOSIS — E782 Mixed hyperlipidemia: Secondary | ICD-10-CM | POA: Diagnosis not present

## 2022-11-14 DIAGNOSIS — I10 Essential (primary) hypertension: Secondary | ICD-10-CM | POA: Diagnosis not present

## 2022-11-14 DIAGNOSIS — R7303 Prediabetes: Secondary | ICD-10-CM | POA: Diagnosis not present

## 2022-11-22 ENCOUNTER — Ambulatory Visit
Admission: RE | Admit: 2022-11-22 | Discharge: 2022-11-22 | Disposition: A | Discharge status: Home / Self Care | Payer: 59 | Source: Ambulatory Visit | Attending: Nurse Practitioner | Admitting: Nurse Practitioner

## 2022-11-22 DIAGNOSIS — Z1231 Encounter for screening mammogram for malignant neoplasm of breast: Secondary | ICD-10-CM | POA: Diagnosis not present

## 2022-11-22 DIAGNOSIS — C50511 Malignant neoplasm of lower-outer quadrant of right female breast: Secondary | ICD-10-CM

## 2022-12-27 DIAGNOSIS — F332 Major depressive disorder, recurrent severe without psychotic features: Secondary | ICD-10-CM | POA: Diagnosis not present

## 2022-12-27 DIAGNOSIS — I10 Essential (primary) hypertension: Secondary | ICD-10-CM | POA: Diagnosis not present

## 2022-12-27 DIAGNOSIS — R7303 Prediabetes: Secondary | ICD-10-CM | POA: Diagnosis not present

## 2022-12-27 DIAGNOSIS — F411 Generalized anxiety disorder: Secondary | ICD-10-CM | POA: Diagnosis not present

## 2022-12-27 DIAGNOSIS — E782 Mixed hyperlipidemia: Secondary | ICD-10-CM | POA: Diagnosis not present

## 2023-01-01 DIAGNOSIS — F411 Generalized anxiety disorder: Secondary | ICD-10-CM | POA: Diagnosis not present

## 2023-01-01 DIAGNOSIS — F339 Major depressive disorder, recurrent, unspecified: Secondary | ICD-10-CM | POA: Diagnosis not present

## 2023-01-01 DIAGNOSIS — F4310 Post-traumatic stress disorder, unspecified: Secondary | ICD-10-CM | POA: Diagnosis not present

## 2023-03-10 ENCOUNTER — Encounter: Payer: Self-pay | Admitting: Hematology

## 2023-03-10 ENCOUNTER — Other Ambulatory Visit: Payer: Self-pay | Admitting: Hematology

## 2023-03-10 DIAGNOSIS — D500 Iron deficiency anemia secondary to blood loss (chronic): Secondary | ICD-10-CM

## 2023-03-20 DIAGNOSIS — C50911 Malignant neoplasm of unspecified site of right female breast: Secondary | ICD-10-CM | POA: Diagnosis not present

## 2023-04-01 ENCOUNTER — Encounter: Payer: Self-pay | Admitting: Hematology

## 2023-04-01 ENCOUNTER — Ambulatory Visit (INDEPENDENT_AMBULATORY_CARE_PROVIDER_SITE_OTHER): Payer: 59

## 2023-04-01 ENCOUNTER — Ambulatory Visit
Admission: EM | Admit: 2023-04-01 | Discharge: 2023-04-01 | Disposition: A | Discharge status: Home / Self Care | Payer: 59 | Attending: Internal Medicine | Admitting: Internal Medicine

## 2023-04-01 DIAGNOSIS — Z791 Long term (current) use of non-steroidal anti-inflammatories (NSAID): Secondary | ICD-10-CM | POA: Diagnosis not present

## 2023-04-01 DIAGNOSIS — Z8249 Family history of ischemic heart disease and other diseases of the circulatory system: Secondary | ICD-10-CM | POA: Diagnosis not present

## 2023-04-01 DIAGNOSIS — J029 Acute pharyngitis, unspecified: Secondary | ICD-10-CM | POA: Diagnosis not present

## 2023-04-01 DIAGNOSIS — R051 Acute cough: Secondary | ICD-10-CM | POA: Insufficient documentation

## 2023-04-01 DIAGNOSIS — R509 Fever, unspecified: Secondary | ICD-10-CM | POA: Diagnosis not present

## 2023-04-01 DIAGNOSIS — Z7984 Long term (current) use of oral hypoglycemic drugs: Secondary | ICD-10-CM | POA: Diagnosis not present

## 2023-04-01 DIAGNOSIS — F411 Generalized anxiety disorder: Secondary | ICD-10-CM | POA: Diagnosis not present

## 2023-04-01 DIAGNOSIS — F329 Major depressive disorder, single episode, unspecified: Secondary | ICD-10-CM | POA: Diagnosis not present

## 2023-04-01 DIAGNOSIS — K219 Gastro-esophageal reflux disease without esophagitis: Secondary | ICD-10-CM | POA: Diagnosis not present

## 2023-04-01 DIAGNOSIS — Z79811 Long term (current) use of aromatase inhibitors: Secondary | ICD-10-CM | POA: Diagnosis not present

## 2023-04-01 DIAGNOSIS — J069 Acute upper respiratory infection, unspecified: Secondary | ICD-10-CM | POA: Diagnosis not present

## 2023-04-01 DIAGNOSIS — I10 Essential (primary) hypertension: Secondary | ICD-10-CM | POA: Diagnosis not present

## 2023-04-01 DIAGNOSIS — M8580 Other specified disorders of bone density and structure, unspecified site: Secondary | ICD-10-CM | POA: Diagnosis not present

## 2023-04-01 DIAGNOSIS — Z833 Family history of diabetes mellitus: Secondary | ICD-10-CM | POA: Diagnosis not present

## 2023-04-01 DIAGNOSIS — E785 Hyperlipidemia, unspecified: Secondary | ICD-10-CM | POA: Diagnosis not present

## 2023-04-01 DIAGNOSIS — R059 Cough, unspecified: Secondary | ICD-10-CM | POA: Diagnosis not present

## 2023-04-01 DIAGNOSIS — C50919 Malignant neoplasm of unspecified site of unspecified female breast: Secondary | ICD-10-CM | POA: Diagnosis not present

## 2023-04-01 LAB — POC COVID19/FLU A&B COMBO
Covid Antigen, POC: NEGATIVE
Influenza A Antigen, POC: NEGATIVE
Influenza B Antigen, POC: NEGATIVE

## 2023-04-01 LAB — POCT RAPID STREP A (OFFICE): Rapid Strep A Screen: NEGATIVE

## 2023-04-01 MED ORDER — NAPROXEN 375 MG PO TABS: 375.0000 mg | ORAL_TABLET | Freq: Two times a day (BID) | ORAL | 0 refills | Status: DC | PRN

## 2023-04-01 MED ORDER — BENZONATATE 200 MG PO CAPS: 200.0000 mg | ORAL_CAPSULE | Freq: Three times a day (TID) | ORAL | 0 refills | Status: DC | PRN

## 2023-04-01 NOTE — ED Provider Notes (Addendum)
[facility]    CSN: [id number] Arrival date & time: 04/01/23  6295      History   Chief Complaint Chief Complaint  Patient presents with   Headache   Chills    HPI Melanie Moss is a 47 y.o. female  presents for evaluation of URI symptoms for 3-4 days. Patient reports associated symptoms of cough, congestion, sore throat, fevers, body aches, chills, and HA. Denies N/V/D, ear pain, shortness of breath. Patient does not have a hx of asthma. Patient does not have a history of smoking.  Reports son had similar symptoms last week.  Pt has taken Tylenol/ibuprofen/Sudafed OTC for symptoms. Pt has no other concerns at this time.    Headache Associated symptoms: congestion, cough, fever, myalgias and sore throat     Past Medical History:  Diagnosis Date   Anemia    Anxiety    Blood transfusion without reported diagnosis    Breast cancer of lower-outer quadrant of right female breast ([facility]) 07/29/2015   Depression    Family history of breast cancer    Family history of colon cancer    Family history of prostate cancer    GERD (gastroesophageal reflux disease)    tums    History of radiation therapy 03/07/16-04/27/16   right chest wall 45 Gy in 25 fractions, right mastectomy scar 16 Gy in 8 fractions   Hyperlipidemia    Hypertension    Lymphedema    Personal history of chemotherapy    Personal history of radiation therapy    Preterm labor     Patient Active Problem List   Diagnosis Date Noted   Pain in right hand 02/26/2022   Postoperative state 10/27/2019   Post-operative state 10/27/2019   History of therapeutic radiation 11/11/2017   Acquired absence of right breast 08/08/2017   Seroma of breast 04/18/2017   Breast asymmetry following reconstructive surgery 11/14/2015   Genetic testing 08/16/2015   Iron deficiency anemia due to chronic blood loss 08/03/2015   Family history of breast cancer    Family history of colon cancer    Family history of prostate  cancer    Breast cancer of lower-outer quadrant of right female breast ([facility]) 07/29/2015   Atypical squamous cells of undetermined significance (ASCUS) on Papanicolaou smear of cervix 07/27/2015   Leiomyoma of uterus, unspecified 08/01/2012   Irregular menstrual cycle 08/01/2012   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   HTN (hypertension) 12/22/2010   Anemia 12/22/2010   Acid reflux 12/22/2010    Past Surgical History:  Procedure Laterality Date   AXILLARY LYMPH NODE DISSECTION Right 09/29/2015   Procedure: AXILLARY LYMPH NODE DISSECTION;  Surgeon: Glenna Fellows, MD;  [facility];  Service: General;  Laterality: Right;   BREAST IMPLANT REMOVAL Right 06/01/2017   Procedure: EXPLANT OF RIGHT 480 MENTOR BREAST IMPLANT;  Surgeon: Peggye Form, DO;  [facility];  Service: Plastics;  Laterality: Right;   BREAST RECONSTRUCTION Right 08/08/2017   Procedure: RIGHT BREAST RECONSTRUCTION WITH LATISSMA MYOCUTANEOUS FLAP AND EXPANDER PLACEMENT;  Surgeon: Peggye Form, DO;  [facility];  Service: Plastics;  Laterality: Right;   BREAST RECONSTRUCTION WITH PLACEMENT OF TISSUE EXPANDER AND FLEX HD (ACELLULAR HYDRATED DERMIS) Right 09/29/2015   Procedure: BREAST RECONSTRUCTION WITH PLACEMENT OF TISSUE EXPANDER AND FLEX HD (ACELLULAR HYDRATED DERMIS);  Surgeon: Alena Bills Dillingham, DO;  [facility];  Service: Plastics;  Laterality: Right;   CERVICAL CERCLAGE  02/19/2011   Procedure: CERCLAGE CERVICAL;  Surgeon: Wilmer Floor  Tamela Oddi, MD;  Location: [facility];  Service: Gynecology;  Laterality: N/A;   DILATION AND CURETTAGE OF UTERUS     INCISION AND DRAINAGE OF WOUND Right 04/19/2017   Procedure: IRRIGATION OF RIGHT BREAST POCKET;  Surgeon: Peggye Form, DO;  Location: [facility];  Service: Plastics;  Laterality: Right;   LATISSIMUS FLAP TO BREAST Right 08/08/2017   Procedure: LATISSIMUS FLAP TO BREAST;  Surgeon: Peggye Form, DO;  [facility];   Service: Plastics;  Laterality: Right;   MASTECTOMY Right 2017   right 2017   MASTECTOMY WITH AXILLARY LYMPH NODE DISSECTION Right 09/29/2015   MASTOPEXY Left 03/11/2017   Procedure: MASTOPEXY FOR SYMENTRY;  Surgeon: Peggye Form, DO;  Location: [facility];  Service: Plastics;  Laterality: Left;   PORT-A-CATH REMOVAL  10/2016   PORTACATH PLACEMENT N/A 09/29/2015   Procedure: INSERTION PORT-A-CATH;  Surgeon: Glenna Fellows, MD;  [facility];  Service: General;  Laterality: N/A;   REDUCTION MAMMAPLASTY Left    Breast lift   REMOVAL OF BILATERAL TISSUE EXPANDERS WITH PLACEMENT OF BILATERAL BREAST IMPLANTS Right 03/11/2017   Procedure: REMOVAL OF RIGHT TISSUE EXPANDERS WITH PLACEMRIGHT SILICONE  BREAST IMPLANTS;  Surgeon: Peggye Form, DO;  Location: [facility];  Service: Plastics;  Laterality: Right;   REMOVAL OF TISSUE EXPANDER AND PLACEMENT OF IMPLANT Right 10/19/2017   Procedure: REMOVAL OF TISSUE EXPANDER;  Surgeon: Peggye Form, DO;  [facility];  Service: Plastics;  Laterality: Right;   ROBOTIC ASSISTED TOTAL HYSTERECTOMY WITH BILATERAL SALPINGO OOPHERECTOMY Bilateral 10/27/2019   Procedure: XI ROBOTIC ASSISTED TOTAL HYSTERECTOMY WITH BILATERAL SALPINGO OOPHORECTOMY;  Surgeon: Willodean Rosenthal, MD;  Location: [facility];  Service: Gynecology;  Laterality: Bilateral;   SIMPLE MASTECTOMY WITH AXILLARY SENTINEL NODE BIOPSY Right 09/29/2015   Procedure: RIGHT TOTAL  MASTECTOMY WITH AXILLARY SENTINEL NODE BIOPSY;  Surgeon: Glenna Fellows, MD;  [facility];  Service: General;  Laterality: Right;    OB History     Gravida  6   Para  4   Term  0   Preterm  4   AB  1   Living  1      SAB  1   IAB  0   Ectopic  0   Multiple  0   Live Births  1            Home Medications    Prior to Admission medications   Medication Sig Start Date End Date Taking? Authorizing Provider   benzonatate (TESSALON) 200 MG capsule Take 1 capsule (200 mg total) by mouth 3 (three) times daily as needed. 04/01/23  Yes Radford Pax, NP  naproxen (NAPROSYN) 375 MG tablet Take 1 tablet (375 mg total) by mouth 2 (two) times daily as needed. 04/01/23  Yes Radford Pax, NP  ALPRAZolam Prudy Feeler) 0.25 MG tablet Take 0.125 mg by mouth daily as needed. Patient not taking: Reported on 06/21/2022 04/27/21   [provider]  ARIPiprazole (ABILIFY) 10 MG tablet Take 10 mg by mouth daily. Patient not taking: Reported on 06/21/2022 03/20/21   [provider]  B Complex-C (SUPER B COMPLEX PO) Take 1 tablet by mouth daily.    [provider]  busPIRone (BUSPAR) 10 MG tablet Take 10 mg by mouth 3 (three) times daily. 03/06/22   [provider]  FARXIGA 5 MG TABS tablet Take 5 mg by mouth every morning. 05/27/21   [provider]  irbesartan-hydrochlorothiazide (AVALIDE) 150-12.5 MG tablet Take 1 tablet by mouth daily.    [provider]  letrozole [facility]) 2.5 MG tablet TAKE 1 TABLET BY MOUTH EVERY DAY 03/11/23   Malachy Mood, MD  PARoxetine (PAXIL CR) 25 MG 24 hr tablet Take 1 tablet (25 mg total) by mouth daily. Patient not taking: Reported on 06/21/2022 02/10/20   Willodean Rosenthal, MD  Vitamin D3 (VITAMIN D) 25 MCG tablet Take 1,000 Units by mouth daily.    [provider]    Family History Family History  Problem Relation Age of Onset   Hypertension Mother    Hypertension Father    Prostate cancer Father 53   Breast cancer Maternal Aunt 40   Throat cancer Maternal Aunt        non smoker   Cirrhosis Maternal Uncle    Breast cancer Paternal Aunt 18   Prostate cancer Paternal Uncle    Prostate cancer Paternal Uncle    Colon cancer Cousin        maternal first cousin dx in his 8s   Lung cancer Cousin        smoker   Colon polyps Neg Hx    Esophageal cancer Neg Hx    Rectal cancer Neg Hx    Stomach cancer Neg Hx     Social  History Social History   Tobacco Use   Smoking status: Never   Smokeless tobacco: Never  Vaping Use   Vaping status: Never Used  Substance Use Topics   Alcohol use: No   Drug use: No     Allergies   Patient has no known allergies.   Review of Systems Review of Systems  Constitutional:  Positive for chills and fever.  HENT:  Positive for congestion and sore throat.   Respiratory:  Positive for cough.   Musculoskeletal:  Positive for myalgias.  Neurological:  Positive for headaches.     Physical Exam Triage Vital Signs ED Triage Vitals  Encounter Vitals Group     BP 04/01/23 1027 (!) 165/98     Systolic BP Percentile --      Diastolic BP Percentile --      Pulse Rate 04/01/23 1027 (!) 107     Resp 04/01/23 1027 18     Temp 04/01/23 1027 99.4 F (37.4 C)     Temp Source 04/01/23 1027 Oral     SpO2 04/01/23 1027 98 %     Weight --      Height --      Head Circumference --      Peak Flow --      Pain Score 04/01/23 1024 8     Pain Loc --      Pain Education --      Exclude from Growth Chart --    No data found.  Updated Vital Signs BP (!) 165/98 (BP Location: Left Arm)   Pulse (!) 107   Temp 99.4 F (37.4 C) (Oral)   Resp 18   LMP 04/23/2018 Comment: Hysterectomy 10/28/19  SpO2 98%   Visual Acuity Right Eye Distance:   Left Eye Distance:   Bilateral Distance:    Right Eye Near:   Left Eye Near:    Bilateral Near:     Physical Exam Vitals and nursing note reviewed.  Constitutional:      General: She is not in acute distress.    Appearance: She is well-developed. She is not ill-appearing.  HENT:     Head: Normocephalic  and atraumatic.     Right Ear: Tympanic membrane and ear canal normal.     Left Ear: Tympanic membrane and ear canal normal.     Nose: Congestion present.     Mouth/Throat:     Mouth: Mucous membranes are moist.     Pharynx: Oropharynx is clear. Uvula midline. Posterior oropharyngeal erythema present.     Tonsils: No tonsillar  exudate or tonsillar abscesses.  Eyes:     Conjunctiva/sclera: Conjunctivae normal.     Pupils: Pupils are equal, round, and reactive to light.  Cardiovascular:     Rate and Rhythm: Regular rhythm. Tachycardia present.     Heart sounds: Normal heart sounds.     Comments: Mildly tachycardia 107 Pulmonary:     Effort: Pulmonary effort is normal.     Breath sounds: Normal breath sounds.  Musculoskeletal:     Cervical back: Normal range of motion and neck supple.  Lymphadenopathy:     Cervical: No cervical adenopathy.  Skin:    General: Skin is warm and dry.  Neurological:     General: No focal deficit present.     Mental Status: She is alert and oriented to person, place, and time.  Psychiatric:        Mood and Affect: Mood normal.        Behavior: Behavior normal.      UC Treatments / Results  Labs (all labs ordered are listed, but only abnormal results are displayed) Labs Reviewed  CULTURE, GROUP A STREP [facility])  POCT RAPID STREP A (OFFICE)  POC COVID19/FLU A&B COMBO    EKG   Radiology DG Chest 2 View  Result Date: 04/01/2023 CLINICAL DATA:  Cough and fever EXAM: CHEST - 2 VIEW COMPARISON:  Radiograph 06/05/2017 FINDINGS: Normal mediastinum and cardiac silhouette. Normal pulmonary vasculature. No evidence of effusion, infiltrate, or pneumothorax. No acute bony abnormality. RIGHT axillary dissection clips. Sclerotic lesion in the mid sternum is not changed from radiograph 06/05/2017. IMPRESSION: No acute cardiopulmonary process. Electronically Signed   By: Genevive Bi M.D.   On: 04/01/2023 11:35    Procedures Procedures (including critical care time)  Medications Ordered in UC Medications - No data to display  Initial Impression / Assessment and Plan / UC Course  I have reviewed the triage vital signs and the nursing notes.  Pertinent labs & imaging results that were available during my care of the patient were reviewed by me and considered in my medical  decision making (see chart for details).     Reviewed exam and symptoms with patient.  No red flags.  Negative rapid strep, flu, COVID testing.  Will send throat culture.  Chest x-ray negative.  Discussed viral illness and symptomatic treatment.  Tessalon as needed for cough.  Naproxen twice daily as needed for headache.  Declined nasal sprays as she does not tolerate these.  PCP follow-up 2 to 3 days for recheck.  ER precautions reviewed. Final Clinical Impressions(s) / UC Diagnoses   Final diagnoses:  Sore throat  Acute cough  Viral upper respiratory illness     Discharge Instructions      Start Tessalon as needed for cough.  Naproxen twice daily as needed for your headaches.  Lots of rest and fluids.  Please follow-up with your PCP in 2 to 3 days for recheck.  Please go to the ER for any worsening symptoms.  I hope you feel better soon!     ED Prescriptions     Medication Sig Dispense  Auth. Provider   benzonatate (TESSALON) 200 MG capsule Take 1 capsule (200 mg total) by mouth 3 (three) times daily as needed. 20 capsule Radford Pax, NP   naproxen (NAPROSYN) 375 MG tablet Take 1 tablet (375 mg total) by mouth 2 (two) times daily as needed. 14 tablet Radford Pax, NP      PDMP not reviewed this encounter.   Radford Pax, NP 04/01/23 1144    Radford Pax, NP 04/01/23 1145

## 2023-04-01 NOTE — ED Triage Notes (Signed)
Pt presents to UC for c/o aches, chills, headache, weakness, fever x4 days. Home remedies: motrin, sudafed

## 2023-04-01 NOTE — Discharge Instructions (Signed)
Start Tessalon as needed for cough.  Naproxen twice daily as needed for your headaches.  Lots of rest and fluids.  Please follow-up with your PCP in 2 to 3 days for recheck.  Please go to the ER for any worsening symptoms.  I hope you feel better soon!

## 2023-04-04 LAB — CULTURE, GROUP A STREP (THRC)

## 2023-06-07 ENCOUNTER — Other Ambulatory Visit: Payer: Self-pay | Admitting: Hematology

## 2023-06-07 DIAGNOSIS — D500 Iron deficiency anemia secondary to blood loss (chronic): Secondary | ICD-10-CM

## 2023-06-09 NOTE — Assessment & Plan Note (Signed)
 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  S/p right mastectomy and flap reconstruction, adjuvant chemo, radiation, and HER2 antibodies. -She started antiestrogen therapy with Tamoxifen in 04/2016 and was switched to Letrozole on 01/17/2018 with ovarian suppression. S/p BSO 10/2019, plan for a total of 10 years if she tolerates  -Last mammogram 11/2022 negative, breast density C, will do contrast enhanced MM in future

## 2023-06-10 ENCOUNTER — Encounter: Payer: Self-pay | Admitting: Hematology

## 2023-06-10 ENCOUNTER — Inpatient Hospital Stay: Payer: Managed Care, Other (non HMO) | Attending: Hematology

## 2023-06-10 ENCOUNTER — Other Ambulatory Visit: Payer: Self-pay | Admitting: *Deleted

## 2023-06-10 ENCOUNTER — Inpatient Hospital Stay (HOSPITAL_BASED_OUTPATIENT_CLINIC_OR_DEPARTMENT_OTHER): Payer: Managed Care, Other (non HMO) | Admitting: Hematology

## 2023-06-10 VITALS — BP 129/91 | HR 97 | Temp 97.6°F | Resp 16 | Wt 159.2 lb

## 2023-06-10 DIAGNOSIS — Z170 Estrogen receptor positive status [ER+]: Secondary | ICD-10-CM

## 2023-06-10 DIAGNOSIS — I10 Essential (primary) hypertension: Secondary | ICD-10-CM | POA: Insufficient documentation

## 2023-06-10 DIAGNOSIS — E2839 Other primary ovarian failure: Secondary | ICD-10-CM | POA: Diagnosis not present

## 2023-06-10 DIAGNOSIS — M8580 Other specified disorders of bone density and structure, unspecified site: Secondary | ICD-10-CM | POA: Insufficient documentation

## 2023-06-10 DIAGNOSIS — C50511 Malignant neoplasm of lower-outer quadrant of right female breast: Secondary | ICD-10-CM | POA: Diagnosis present

## 2023-06-10 DIAGNOSIS — E1159 Type 2 diabetes mellitus with other circulatory complications: Secondary | ICD-10-CM | POA: Diagnosis not present

## 2023-06-10 DIAGNOSIS — Z9011 Acquired absence of right breast and nipple: Secondary | ICD-10-CM | POA: Diagnosis not present

## 2023-06-10 DIAGNOSIS — Z1721 Progesterone receptor positive status: Secondary | ICD-10-CM | POA: Insufficient documentation

## 2023-06-10 DIAGNOSIS — Z79899 Other long term (current) drug therapy: Secondary | ICD-10-CM | POA: Diagnosis not present

## 2023-06-10 DIAGNOSIS — Z1211 Encounter for screening for malignant neoplasm of colon: Secondary | ICD-10-CM | POA: Diagnosis not present

## 2023-06-10 DIAGNOSIS — Z1731 Human epidermal growth factor receptor 2 positive status: Secondary | ICD-10-CM | POA: Insufficient documentation

## 2023-06-10 LAB — CMP (CANCER CENTER ONLY)
ALT: 14 U/L (ref 0–44)
AST: 21 U/L (ref 15–41)
Albumin: 5.1 g/dL — ABNORMAL HIGH (ref 3.5–5.0)
Alkaline Phosphatase: 97 U/L (ref 38–126)
Anion gap: 8 (ref 5–15)
BUN: 11 mg/dL (ref 6–20)
CO2: 28 mmol/L (ref 22–32)
Calcium: 10.7 mg/dL — ABNORMAL HIGH (ref 8.9–10.3)
Chloride: 105 mmol/L (ref 98–111)
Creatinine: 0.89 mg/dL (ref 0.44–1.00)
GFR, Estimated: >60 mL/min (ref >60)
Glucose, Bld: 106 mg/dL — ABNORMAL HIGH (ref 70–99)
Potassium: 3.9 mmol/L (ref 3.5–5.1)
Sodium: 141 mmol/L (ref 135–145)
Total Bilirubin: 0.5 mg/dL (ref 0.0–1.2)
Total Protein: 8.1 g/dL (ref 6.5–8.1)

## 2023-06-10 LAB — CBC WITH DIFFERENTIAL (CANCER CENTER ONLY)
Abs Immature Granulocytes: 0.02 10*3/uL (ref 0.00–0.07)
Basophils Absolute: 0.1 10*3/uL (ref 0.0–0.1)
Basophils Relative: 1 %
Eosinophils Absolute: 0.2 10*3/uL (ref 0.0–0.5)
Eosinophils Relative: 2 %
HCT: 40.8 % (ref 36.0–46.0)
Hemoglobin: 13.2 g/dL (ref 12.0–15.0)
Immature Granulocytes: 0 %
Lymphocytes Relative: 38 %
Lymphs Abs: 3 10*3/uL (ref 0.7–4.0)
MCH: 27.8 pg (ref 26.0–34.0)
MCHC: 32.4 g/dL (ref 30.0–36.0)
MCV: 86.1 fL (ref 80.0–100.0)
Monocytes Absolute: 0.4 10*3/uL (ref 0.1–1.0)
Monocytes Relative: 5 %
Neutro Abs: 4.2 10*3/uL (ref 1.7–7.7)
Neutrophils Relative %: 54 %
Platelet Count: 316 10*3/uL (ref 150–400)
RBC: 4.74 MIL/uL (ref 3.87–5.11)
RDW: 13.2 % (ref 11.5–15.5)
WBC Count: 7.8 10*3/uL (ref 4.0–10.5)
nRBC: 0 % (ref 0.0–0.2)

## 2023-06-10 NOTE — Progress Notes (Signed)
 [facility]   [phone] Fax:([phone]   Clinic Follow up Note   Patient Care Team: Renaye Rakers, MD as PCP - General (Family Medicine) Glenna Fellows, MD (Inactive) as Consulting Physician (General Surgery) Malachy Mood, MD as Consulting Physician (Hematology) Axel Filler Larna Daughters, NP as Nurse Practitioner (Hematology and Oncology) Antony Blackbird, MD as Consulting Physician (Radiation Oncology)  Date of Service:  06/10/2023  CHIEF COMPLAINT: f/u of right breast cancer  CURRENT THERAPY:  Letrozole since January 2019  Oncology History   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  S/p right mastectomy and flap reconstruction, adjuvant chemo, radiation, and HER2 antibodies. -She started antiestrogen therapy with Tamoxifen in 04/2016 and was switched to Letrozole on 01/17/2018 with ovarian suppression. S/p BSO 10/2019, plan for a total of 10 years if she tolerates  -Last mammogram 11/2022 negative, breast density C, will do contrast enhanced MM in future    Assessment and Plan    Breast Cancer (Post-Mastectomy) Diagnosed in 2017, underwent mastectomy and implant removal due to complications. On Letrozole since September 2019, previously on Tamoxifen since January 2018. No current pain, only numbness. No signs of recurrence. Bone density scan in 2023 showed osteopenia, likely due to Letrozole. Discussed benefits of continuing Letrozole until January 2028, with monitoring for bone density and potential side effects. Explained option of contrast-enhanced mammogram due to dense breast tissue for early detection. - Order contrast-enhanced mammogram for August 2025 - Order bone density scan - Continue Letrozole until January 2028 - Monitor for signs of  recurrence, including new bone pain, unexplained weight loss, or other systemic symptoms - Provide prescription for breast prosthesis if needed  Osteopenia Bone density scan in 2023 showed osteopenia. Not currently taking Vitamin D or calcium supplements. Discussed importance of supplementation to mitigate bone density loss, especially due to Letrozole. - Recommend over-the-counter Vitamin D and calcium supplements - Repeat bone density scan annually  Hypertension Blood pressure is well-controlled with current medication regimen. No specific issues reported. - Continue current antihypertensive medication  Type 2 Diabetes Mellitus Recent high blood sugar levels likely due to dietary indiscretions during the holidays. Working on improving diet. - Continue current diabetic medications (Jardiance, Farxiga) - Encourage dietary modifications to better control blood sugar levels  General Health Maintenance Due for a mammogram and bone density scan. Colonoscopy performed last year. No other specific health maintenance issues discussed. - Order contrast-enhanced mammogram for August 2025 - Order bone density scan  Plan -She is clinically doing well, lab reviewed, exam was unremarkable. -Continue letrozole until January 2020 - Schedule follow-up appointment for next year - Monitor for signs of recurrence and contact clinic if any concerning symptoms develop.  -Ordered screening mammogram and bone density scan to be done in August 2025        SUMMARY OF ONCOLOGIC HISTORY: Oncology History Overview Note  Breast cancer of lower-outer quadrant of right female breast [facility])   Staging form: Breast, AJCC 7th Edition     Clinical stage from 08/03/2015: Stage IA (T1c, N0, M0) - Unsigned     Pathologic stage from 09/29/2015: Stage IIIA (T3(m), N1a, cM0) - Signed by Malachy Mood, MD on 10/17/2015       Breast cancer of lower-outer quadrant of right female breast (HCC)  07/25/2015 Mammogram   diagnostic  mammogram and ultrasound showed a 11  mm mass in the 8:00 position of the right breast, small irregular lesion in the retroareolar regio of the right breast, contiguous with the dermis   07/26/2015 Initial Diagnosis   Breast cancer of lower-outer quadrant of right female breast (HCC)   07/26/2015 Initial Biopsy   right breast needle core biopsy at 8:00 position (1), invasive ductal carcinoma, grade 3, 6:30 o'clock position invasive (2) and in situ ductal carcinoma, grade 1-2, lymphovascular invasion is identified   07/26/2015 Receptors her2   (1) ER 95% positive, PR 95% positive, Ki-67 30%, HER-2 positive with ratio 2.04, copy # 5.20, (2) ER 90% positive, PR 95% positive, Ki-67 10%, HER-2 negative.   08/02/2015 Imaging   breast MRI showed extensive mass  and non-mass enhancement in the lower outer quadrant of the right breast  spanning 13 cm, focal enhancement within the right nipple, at least 3-4 abnormal appearing right axillary lymph node.   09/29/2015 Surgery   right mastectomy and axillar node dissection    09/29/2015 Pathology Results   Right mastectomy and axillary node dissection showed multifocal mammary carcinoma with ductal and lobular features, tumor size 13 cm, 2.1 cm, 1.9 cm, and 0.5 cm, grade 2-3, anterior margins were positive, 2 nodes with metastasis and one with isolated cells   11/01/2015 - 02/14/2016 Chemotherapy   TCHP every 3 weeks, for 6 cycles, followed by herceptin maintenance therapy for a total of one year    03/06/2016 - 11/01/2016 Antibody Plan   Maintenance Herceptin and perjeta, every 3 weeks   03/07/2016 - 04/27/2016 Radiation Therapy   03/07/16-04/27/16 Site/dose: 1) 20fld right chest wall/ 45 Gy in 25 fractions 2) Right mastectomy scar / 16 Gy in 8 fractions   05/21/2016 -  Anti-estrogen oral therapy   Tamoxifen 20 mg daily starting 05/21/16 Switched to Letrozole 2.5mg  daily on 01/17/18.  Started monthyl Zolodex injections on 12/16/17. Last on 10/17/19 as she is s/p BSO  on 10/27/19.    10/17/2016 Mammogram   IMPRESSION: No mammographic evidence of malignancy. A result letter of this screening mammogram will be mailed directly to the patient.   10/22/2016 Echocardiogram   Impressions:   - Normal study. Stable GLPSS at -21%. LVEF 60-65%.   11/09/2016 - 11/09/2017 Anti-estrogen oral therapy    Neratinib 240mg  daily started on 11/09/2016. Completed in one year   02/04/2017 Survivorship   Survivorship Clinic with Loa Socks, NP   03/11/2017 Surgery   REMOVAL OF RIGHT TISSUE EXPANDERS WITH PLACEMRIGHT SILICONE  BREAST IMPLANTS and left MASTOPEXY FOR SYMENTRY by Dr. Ulice Bold  03/11/17   03/11/2017 Pathology Results   Diagnosis 03/11/17 1. Breast, capsule, right inferior BENIGN FIBROVASCULAR TISSUE 2. Breast, Mammoplasty, left BENIGN BREAST TISSUE NEGATIVE FOR ATYPIA OR MALIGNANCY   10/27/2019 Surgery   XI ROBOTIC ASSISTED TOTAL HYSTERECTOMY WITH BILATERAL SALPINGO OOPHORECTOMY by Dr Erin Fulling and Dr Macon Large   FINAL MICROSCOPIC DIAGNOSIS:   A. UTERUS, CERVIX, BILATERAL TUBES AND OVARIES:  - Uterus with leiomyomata, largest measuring 2.2 cm  - Benign inactive endometrium  - Adenomyosis  - Benign unremarkable cervix  - Benign unremarkable bilateral fallopian tubes and ovaries  - No evidence of malignancy       Discussed the use of AI scribe software for clinical note transcription with the patient, who gave verbal consent to proceed.  History of Present Illness   The patient, a 48 year old female with a history of breast cancer, hypertension, and diabetes, presents for a routine follow-up. She reports that her blood pressure has been  improving with a new medication, and she is actively trying to manage her blood sugar levels, which were recently elevated due to dietary indiscretions over the holidays. She has been experiencing fewer hot flashes than before and denies any new pain or discomfort. She has also lost a few pounds  through dietary changes.  The patient underwent a mastectomy for breast cancer in 2017 and was initially on tamoxifen, which was switched to letrozole in September 2019. She reports some residual numbness in the area of the surgery but no other discomfort. She also had a hysterectomy with bilateral oophorectomy about four to five years ago. She has been postmenopausal since then.         All other systems were reviewed with the patient and are negative.  MEDICAL HISTORY:  Past Medical History:  Diagnosis Date   Anemia    Anxiety    Blood transfusion without reported diagnosis    Breast cancer of lower-outer quadrant of right female breast (HCC) 07/29/2015   Depression    Family history of breast cancer    Family history of colon cancer    Family history of prostate cancer    GERD (gastroesophageal reflux disease)    tums    History of radiation therapy 03/07/16-04/27/16   right chest wall 45 Gy in 25 fractions, right mastectomy scar 16 Gy in 8 fractions   Hyperlipidemia    Hypertension    Lymphedema    Personal history of chemotherapy    Personal history of radiation therapy    Preterm labor     SURGICAL HISTORY: Past Surgical History:  Procedure Laterality Date   AXILLARY LYMPH NODE DISSECTION Right 09/29/2015   Procedure: AXILLARY LYMPH NODE DISSECTION;  Surgeon: Glenna Fellows, MD;  Location: [facility];  Service: General;  Laterality: Right;   BREAST IMPLANT REMOVAL Right 06/01/2017   Procedure: EXPLANT OF RIGHT 480 MENTOR BREAST IMPLANT;  Surgeon: Peggye Form, DO;  Location: [facility];  Service: Plastics;  Laterality: Right;   BREAST RECONSTRUCTION Right 08/08/2017   Procedure: RIGHT BREAST RECONSTRUCTION WITH LATISSMA MYOCUTANEOUS FLAP AND EXPANDER PLACEMENT;  Surgeon: Peggye Form, DO;  Location: [facility];  Service: Plastics;  Laterality: Right;   BREAST RECONSTRUCTION WITH PLACEMENT OF TISSUE EXPANDER AND FLEX HD (ACELLULAR HYDRATED DERMIS) Right 09/29/2015    Procedure: BREAST RECONSTRUCTION WITH PLACEMENT OF TISSUE EXPANDER AND FLEX HD (ACELLULAR HYDRATED DERMIS);  Surgeon: Alena Bills Dillingham, DO;  Location: [facility];  Service: Plastics;  Laterality: Right;   CERVICAL CERCLAGE  02/19/2011   Procedure: CERCLAGE CERVICAL;  Surgeon: Roseanna Rainbow, MD;  Location: [facility];  Service: Gynecology;  Laterality: N/A;   DILATION AND CURETTAGE OF UTERUS     INCISION AND DRAINAGE OF WOUND Right 04/19/2017   Procedure: IRRIGATION OF RIGHT BREAST POCKET;  Surgeon: Peggye Form, DO;  Location: [facility];  Service: Plastics;  Laterality: Right;   LATISSIMUS FLAP TO BREAST Right 08/08/2017   Procedure: LATISSIMUS FLAP TO BREAST;  Surgeon: Peggye Form, DO;  Location: [facility];  Service: Plastics;  Laterality: Right;   MASTECTOMY Right 2017   right 2017   MASTECTOMY WITH AXILLARY LYMPH NODE DISSECTION Right 09/29/2015   MASTOPEXY Left 03/11/2017   Procedure: MASTOPEXY FOR SYMENTRY;  Surgeon: Peggye Form, DO;  Location: [facility];  Service: Plastics;  Laterality: Left;   PORT-A-CATH REMOVAL  10/2016   PORTACATH PLACEMENT N/A 09/29/2015   Procedure: INSERTION PORT-A-CATH;  Surgeon: Glenna Fellows, MD;  Location: [facility];  Service: General;  Laterality: N/A;   REDUCTION MAMMAPLASTY Left    Breast lift   REMOVAL OF BILATERAL TISSUE EXPANDERS WITH PLACEMENT OF BILATERAL BREAST IMPLANTS Right 03/11/2017   Procedure: REMOVAL OF RIGHT TISSUE EXPANDERS WITH PLACEMRIGHT SILICONE  BREAST IMPLANTS;  Surgeon: Peggye Form, DO;  Location: [facility];  Service: Plastics;  Laterality: Right;   REMOVAL OF TISSUE EXPANDER AND PLACEMENT OF IMPLANT Right 10/19/2017   Procedure: REMOVAL OF TISSUE EXPANDER;  Surgeon: Peggye Form, DO;  Location: [facility];  Service: Plastics;  Laterality: Right;   ROBOTIC ASSISTED TOTAL HYSTERECTOMY WITH BILATERAL SALPINGO OOPHERECTOMY Bilateral 10/27/2019   Procedure:  XI ROBOTIC ASSISTED TOTAL HYSTERECTOMY WITH BILATERAL SALPINGO OOPHORECTOMY;  Surgeon: Willodean Rosenthal, MD;  Location: [facility];  Service: Gynecology;  Laterality: Bilateral;   SIMPLE MASTECTOMY WITH AXILLARY SENTINEL NODE BIOPSY Right 09/29/2015   Procedure: RIGHT TOTAL  MASTECTOMY WITH AXILLARY SENTINEL NODE BIOPSY;  Surgeon: Glenna Fellows, MD;  Location: [facility];  Service: General;  Laterality: Right;    I have reviewed the social history and family history with the patient and they are unchanged from previous note.  ALLERGIES:  has no known allergies.  MEDICATIONS:  Current Outpatient Medications  Medication Sig Dispense Refill   empagliflozin (JARDIANCE) 10 MG TABS tablet Take 10 mg by mouth daily.     NIFEdipine (ADALAT CC) 60 MG 24 hr tablet Take 60 mg by mouth daily.     rosuvastatin (CRESTOR) 40 MG tablet Take 40 mg by mouth daily.     B Complex-C (SUPER B COMPLEX PO) Take 1 tablet by mouth daily.     busPIRone (BUSPAR) 10 MG tablet Take 10 mg by mouth 3 (three) times daily.     FARXIGA 5 MG TABS tablet Take 5 mg by mouth every morning.     irbesartan-hydrochlorothiazide (AVALIDE) 150-12.5 MG tablet Take 1 tablet by mouth daily.     letrozole (FEMARA) 2.5 MG tablet TAKE 1 TABLET BY MOUTH EVERY DAY 30 tablet 2   Vitamin D3 (VITAMIN D) 25 MCG tablet Take 1,000 Units by mouth daily.     No current facility-administered medications for this visit.    PHYSICAL EXAMINATION: ECOG PERFORMANCE STATUS: 0 - Asymptomatic  Vitals:   06/10/23 0935  BP: (!) 129/91  Pulse: 97  Resp: 16  Temp: 97.6 F (36.4 C)  SpO2: 98%   Wt Readings from Last 3 Encounters:  06/10/23 159 lb 3.2 oz (72.2 kg)  07/06/22 162 lb (73.5 kg)  06/21/22 162 lb (73.5 kg)     GENERAL:alert, no distress and comfortable SKIN: skin color, texture, turgor are normal, no rashes or significant lesions EYES: normal, Conjunctiva are pink and non-injected, sclera clear NECK: supple,  thyroid normal size, non-tender, without nodularity LYMPH:  no palpable lymphadenopathy in the cervical, axillary  LUNGS: clear to auscultation and percussion with normal breathing effort HEART: regular rate & rhythm and no murmurs and no lower extremity edema ABDOMEN:abdomen soft, non-tender and normal bowel sounds Musculoskeletal:no cyanosis of digits and no clubbing  NEURO: alert & oriented x 3 with fluent speech, no focal motor/sensory deficits BREAST: Status post right mastectomy, no palpable nodule on chest wall, left breasts normal, no palpable adenopathy     LABORATORY DATA:  I have reviewed the data as listed    Latest Ref Rng & Units 06/10/2023    9:22 AM 06/07/2022    9:05 AM 06/08/2021    9:19 AM  CBC  WBC 4.0 - 10.5 K/uL 7.8  6.4  5.5   Hemoglobin 12.0 - 15.0 g/dL 16.1  09.6  04.5   Hematocrit 36.0 - 46.0 % 40.8  37.8  39.7   Platelets 150 - 400 K/uL 316  312  289         Latest Ref Rng & Units 06/10/2023    9:22 AM 06/07/2022    9:05 AM 06/08/2021    9:19 AM  CMP  Glucose 70 - 99 mg/dL 409  811  914   BUN 6 - 20 mg/dL 11  15  11    Creatinine 0.44 - 1.00 mg/dL 7.82  9.56  2.13   Sodium 135 - 145 mmol/L 141  138  140   Potassium 3.5 - 5.1 mmol/L 3.9  3.3  3.7   Chloride 98 - 111 mmol/L 105  100  105   CO2 22 - 32 mmol/L 28  27  28    Calcium 8.9 - 10.3 mg/dL 08.6  57.8  9.8   Total Protein 6.5 - 8.1 g/dL 8.1  8.6  7.6   Total Bilirubin 0.0 - 1.2 mg/dL 0.5  0.8  0.6   Alkaline Phos 38 - 126 U/L 97  78  87   AST 15 - 41 U/L 21  25  27    ALT 0 - 44 U/L 14  16  18        RADIOGRAPHIC STUDIES: I have personally reviewed the radiological images as listed and agreed with the findings in the report. No results found.    Orders Placed This Encounter  Procedures   MM Digital Screening Unilat L    Contrast enhanced MM    Standing Status:   Future    Expected Date:   12/11/2023    Expiration Date:   06/09/2024    Reason for Exam (SYMPTOM  OR DIAGNOSIS REQUIRED):    SCREENING    Is the patient pregnant?:   No    Preferred imaging location?:   [facility]   DG Bone Density    Standing Status:   Future    Expected Date:   12/11/2023    Expiration Date:   06/09/2024    Reason for Exam (SYMPTOM  OR DIAGNOSIS REQUIRED):   screening    Is patient pregnant?:   No    Preferred imaging location?:   [facility]   All questions were answered. The patient knows to call the clinic with any problems, questions or concerns. No barriers to learning was detected. The total time spent in the appointment was 25 minutes.     Malachy Mood, MD 06/10/2023

## 2023-07-08 ENCOUNTER — Other Ambulatory Visit: Payer: Self-pay | Admitting: Hematology

## 2023-07-08 ENCOUNTER — Other Ambulatory Visit: Payer: Self-pay

## 2023-07-08 DIAGNOSIS — D500 Iron deficiency anemia secondary to blood loss (chronic): Secondary | ICD-10-CM

## 2023-07-08 MED ORDER — LETROZOLE 2.5 MG PO TABS: 2.5000 mg | ORAL_TABLET | Freq: Every day | ORAL | 0 refills | Status: DC

## 2023-07-08 NOTE — Progress Notes (Signed)
 Verbal order w/readback from Dr. Mosetta Putt to refill pt's letrozole 2.5mg  tabs daily.  Per Dr. Mosetta Putt place order for 90 day supply since pt's insurance will not authorize a 30 day supply.  Order placed and sent to pt's pharmacy.  Per Dr. Mosetta Putt typo in last office note.  Pt will continue on Letrozole until January 2026.

## 2023-10-06 ENCOUNTER — Other Ambulatory Visit: Payer: Self-pay | Admitting: Hematology

## 2023-10-06 DIAGNOSIS — D500 Iron deficiency anemia secondary to blood loss (chronic): Secondary | ICD-10-CM

## 2023-11-25 ENCOUNTER — Ambulatory Visit
Admission: RE | Admit: 2023-11-25 | Discharge: 2023-11-25 | Disposition: A | Discharge status: Home / Self Care | Payer: 59 | Source: Ambulatory Visit | Attending: Hematology

## 2023-11-25 ENCOUNTER — Ambulatory Visit
Admission: RE | Admit: 2023-11-25 | Discharge: 2023-11-25 | Disposition: A | Discharge status: Home / Self Care | Source: Ambulatory Visit | Attending: Hematology | Admitting: Hematology

## 2023-11-25 ENCOUNTER — Encounter: Payer: Self-pay | Admitting: Hematology

## 2023-11-25 ENCOUNTER — Other Ambulatory Visit: Payer: Self-pay | Admitting: Hematology

## 2023-11-25 DIAGNOSIS — Z1211 Encounter for screening for malignant neoplasm of colon: Secondary | ICD-10-CM

## 2023-11-25 DIAGNOSIS — E2839 Other primary ovarian failure: Secondary | ICD-10-CM

## 2023-11-25 MED ORDER — IOPAMIDOL (ISOVUE-370) INJECTION 76%
100.0000 mL | Freq: Once | INTRAVENOUS | Status: AC | PRN
  Administered 2023-11-25: 100 mL via INTRAVENOUS

## 2024-01-05 ENCOUNTER — Other Ambulatory Visit: Payer: Self-pay | Admitting: Hematology

## 2024-01-05 DIAGNOSIS — D500 Iron deficiency anemia secondary to blood loss (chronic): Secondary | ICD-10-CM

## 2024-02-05 ENCOUNTER — Other Ambulatory Visit: Payer: 59

## 2024-04-05 ENCOUNTER — Other Ambulatory Visit: Payer: Self-pay | Admitting: Nurse Practitioner

## 2024-04-05 DIAGNOSIS — D500 Iron deficiency anemia secondary to blood loss (chronic): Secondary | ICD-10-CM

## 2024-05-07 ENCOUNTER — Ambulatory Visit
Admission: EM | Admit: 2024-05-07 | Discharge: 2024-05-07 | Disposition: A | Discharge status: Home / Self Care | Attending: Physician Assistant | Admitting: Physician Assistant

## 2024-05-07 ENCOUNTER — Other Ambulatory Visit: Payer: Self-pay

## 2024-05-07 DIAGNOSIS — R6889 Other general symptoms and signs: Secondary | ICD-10-CM | POA: Diagnosis not present

## 2024-05-07 LAB — POCT INFLUENZA A/B
Influenza A, POC: NEGATIVE
Influenza B, POC: NEGATIVE

## 2024-05-07 MED ORDER — FLUTICASONE PROPIONATE 50 MCG/ACT NA SUSP: 1.0000 | Freq: Every day | NASAL | 0 refills | Status: AC

## 2024-05-07 MED ORDER — PROMETHAZINE-DM 6.25-15 MG/5ML PO SYRP: 5.0000 mL | ORAL_SOLUTION | Freq: Four times a day (QID) | ORAL | 0 refills | Status: AC | PRN

## 2024-05-07 MED ORDER — ALBUTEROL SULFATE HFA 108 (90 BASE) MCG/ACT IN AERS: 1.0000 | INHALATION_SPRAY | Freq: Four times a day (QID) | RESPIRATORY_TRACT | 0 refills | Status: AC | PRN

## 2024-05-07 NOTE — Discharge Instructions (Signed)
 Your testing was negative for Flu today.  Based on your described symptoms and the duration of symptoms it is likely that you have a viral upper respiratory infection (often called a cold)  Symptoms can last for 3-10 days with lingering cough and intermittent symptoms potentially  lasting several  weeks after that.  The goal of treatment at this time is to reduce your symptoms and discomfort   You can use the following medications and measures to help yourself feel better until your body fights this off: DayQuil/NyQuil, TheraFlu, Alka-Seltzer  (these medications typically have the same active ingredients in them so you can choose whichever one you prefer and take consistently during the day and night according to the manufactures instructions.) Flonase  A daily antihistamine such as Zyrtec, Claritin, Allegra per your preference.  Please choose 1 and take consistently. Increased fluids.  It is recommended that you take in at least 64 ounces of water  per day when you are not sick so it is important to increase this when you are sick and your body may be running fever. Rest Cough drops Chloraseptic throat spray to help with sore throat Nasal saline spray or nasal flushes to help with congestion and runny nose    I am sending in an albuterol  rescue inhaler for you to use as needed up to every 6 hours. This can help with shortness of breath and severe coughing spells.   I am also sending in a script for promethazine - dextromethorphan cough syrup.  This should help with your coughing as well as the nausea.  Please be advised that it can cause drowsiness and sedation so do not take it if you need to remain alert or drive.  If your symptoms seem like they are getting worse over the next 5 to 7 days or not improving you can always follow-up here in urgent care or go to your primary care provider for further management. Go to the ER if you begin to have more serious symptoms such as shortness of breath,  trouble breathing, loss of consciousness, swelling around the eyes, high fever, severe lasting headaches, vision changes or neck pain/stiffness.

## 2024-05-07 NOTE — ED Triage Notes (Signed)
 Pt presents with c/o generalized body aches, headaches, sore throat, increased thirst, and sweats. This is day four of symptoms. Currently rates overall pain a 7/10. Feels achy all over. Tried ginger tea + Tylenol  sinus at home with no improvement. Does endorse a sick contact at work.

## 2024-05-07 NOTE — ED Provider Notes (Signed)
 " [facility]    CSN: [id number] Arrival date & time: 05/07/24  1329      History   Chief Complaint Chief Complaint  Patient presents with   Generalized Body Aches    HPI Melanie Moss is a 49 y.o. female.   HPI  Pt is here today with concerns of feeling sick. She states she has had body aches, nasal congestion, coughing, runny nose. She reports this started on Monday 05/04/24.  She reports chills at night. She also reports SOB when she is talking for prolonged periods. She also has concerns for sinus pressure and fatigue.  She reports her son had similar symptoms but seems to be feeling better now. She also reports that a few coworkers have been sick lately. She denies previous hx of asthma or breathing conditions Interventions: Tylenol  sinus and headache, this has not provided much relief of symptoms     Past Medical History:  Diagnosis Date   Anemia    Anxiety    Blood transfusion without reported diagnosis    Breast cancer of lower-outer quadrant of right female breast (HCC) 07/29/2015   Depression    Family history of breast cancer    Family history of colon cancer    Family history of prostate cancer    GERD (gastroesophageal reflux disease)    tums    History of radiation therapy 03/07/16-04/27/16   right chest wall 45 Gy in 25 fractions, right mastectomy scar 16 Gy in 8 fractions   Hyperlipidemia    Hypertension    Lymphedema    Personal history of chemotherapy    Personal history of radiation therapy    Preterm labor     Patient Active Problem List   Diagnosis Date Noted   Pain in right hand 02/26/2022   Postoperative state 10/27/2019   Post-operative state 10/27/2019   History of therapeutic radiation 11/11/2017   Acquired absence of right breast 08/08/2017   Seroma of breast 04/18/2017   Breast asymmetry following reconstructive surgery 11/14/2015   Genetic testing 08/16/2015   Iron deficiency anemia due to chronic blood loss 08/03/2015    Family history of breast cancer    Family history of colon cancer    Family history of prostate cancer    Breast cancer of lower-outer quadrant of right female breast (HCC) 07/29/2015   Atypical squamous cells of undetermined significance (ASCUS) on Papanicolaou smear of cervix 07/27/2015   Leiomyoma of uterus, unspecified 08/01/2012   Irregular menstrual cycle 08/01/2012   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   HTN (hypertension) 12/22/2010   Anemia 12/22/2010   Acid reflux 12/22/2010    Past Surgical History:  Procedure Laterality Date   AXILLARY LYMPH NODE DISSECTION Right 09/29/2015   Procedure: AXILLARY LYMPH NODE DISSECTION;  Surgeon: Morene Olives, MD;  Location: [facility];  Service: General;  Laterality: Right;   BREAST IMPLANT REMOVAL Right 06/01/2017   Procedure: EXPLANT OF RIGHT 480 MENTOR BREAST IMPLANT;  Surgeon: Lowery Estefana RAMAN, DO;  Location: [facility];  Service: Plastics;  Laterality: Right;   BREAST RECONSTRUCTION Right 08/08/2017   Procedure: RIGHT BREAST RECONSTRUCTION WITH LATISSMA MYOCUTANEOUS FLAP AND EXPANDER PLACEMENT;  Surgeon: Lowery Estefana RAMAN, DO;  Location: [facility];  Service: Plastics;  Laterality: Right;   BREAST RECONSTRUCTION WITH PLACEMENT OF TISSUE EXPANDER AND FLEX HD (ACELLULAR HYDRATED DERMIS) Right 09/29/2015   Procedure: BREAST RECONSTRUCTION WITH PLACEMENT OF TISSUE EXPANDER AND FLEX HD (ACELLULAR HYDRATED DERMIS);  Surgeon: Estefana RAMAN Dillingham, DO;  Location: [facility];  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;  Laterality: Right;   CERVICAL CERCLAGE  02/19/2011   Procedure: CERCLAGE CERVICAL;  Surgeon: Olam DELENA Mill, MD;  Location: [facility];  Service: Gynecology;  Laterality: N/A;   DILATION AND CURETTAGE OF UTERUS     INCISION AND DRAINAGE OF WOUND Right 04/19/2017   Procedure: IRRIGATION OF RIGHT BREAST POCKET;  Surgeon: Lowery Estefana RAMAN, DO;  Location: [facility];  Service: Plastics;  Laterality: Right;   LATISSIMUS FLAP TO BREAST Right 08/08/2017    Procedure: LATISSIMUS FLAP TO BREAST;  Surgeon: Lowery Estefana RAMAN, DO;  Location: [facility];  Service: Plastics;  Laterality: Right;   MASTECTOMY Right 2017   right 2017   MASTECTOMY WITH AXILLARY LYMPH NODE DISSECTION Right 09/29/2015   MASTOPEXY Left 03/11/2017   Procedure: MASTOPEXY FOR SYMENTRY;  Surgeon: Lowery Estefana RAMAN, DO;  Location: [facility];  Service: Plastics;  Laterality: Left;   PORT-A-CATH REMOVAL  10/2016   PORTACATH PLACEMENT N/A 09/29/2015   Procedure: INSERTION PORT-A-CATH;  Surgeon: Morene Olives, MD;  Location: [facility];  Service: General;  Laterality: N/A;   REDUCTION MAMMAPLASTY Left    Breast lift   REMOVAL OF BILATERAL TISSUE EXPANDERS WITH PLACEMENT OF BILATERAL BREAST IMPLANTS Right 03/11/2017   Procedure: REMOVAL OF RIGHT TISSUE EXPANDERS WITH PLACEMRIGHT SILICONE  BREAST IMPLANTS;  Surgeon: Lowery Estefana RAMAN, DO;  Location: [facility];  Service: Plastics;  Laterality: Right;   REMOVAL OF TISSUE EXPANDER AND PLACEMENT OF IMPLANT Right 10/19/2017   Procedure: REMOVAL OF TISSUE EXPANDER;  Surgeon: Lowery Estefana RAMAN, DO;  Location: [facility];  Service: Plastics;  Laterality: Right;   ROBOTIC ASSISTED TOTAL HYSTERECTOMY WITH BILATERAL SALPINGO OOPHERECTOMY Bilateral 10/27/2019   Procedure: XI ROBOTIC ASSISTED TOTAL HYSTERECTOMY WITH BILATERAL SALPINGO OOPHORECTOMY;  Surgeon: Corene Coy, MD;  Location: [facility] [place];  Service: Gynecology;  Laterality: Bilateral;   SIMPLE MASTECTOMY WITH AXILLARY SENTINEL NODE BIOPSY Right 09/29/2015   Procedure: RIGHT TOTAL  MASTECTOMY WITH AXILLARY SENTINEL NODE BIOPSY;  Surgeon: Morene Olives, MD;  Location: [facility];  Service: General;  Laterality: Right;    OB History     Gravida  6   Para  4   Term  0   Preterm  4   AB  1   Living  1      SAB  1   IAB  0   Ectopic  0   Multiple  0   Live Births  1            Home Medications    Prior  to Admission medications  Medication Sig Start Date End Date Taking? Authorizing Provider  albuterol  (VENTOLIN  HFA) 108 (90 Base) MCG/ACT inhaler Inhale 1-2 puffs into the lungs every 6 (six) hours as needed for wheezing or shortness of breath. 05/07/24  Yes Cassondra Stachowski E, PA-C  fluticasone  (FLONASE ) 50 MCG/ACT nasal spray Place 1 spray into both nostrils daily. 05/07/24  Yes Welton Bord E, PA-C  promethazine -dextromethorphan (PROMETHAZINE -DM) 6.25-15 MG/5ML syrup Take 5 mLs by mouth 4 (four) times daily as needed. 05/07/24  Yes Finnis Colee E, PA-C  B Complex-C (SUPER B COMPLEX PO) Take 1 tablet by mouth daily.    [provider]  busPIRone (BUSPAR) 10 MG tablet Take 10 mg by mouth 3 (three) times daily. 03/06/22   [provider]  empagliflozin (JARDIANCE) 10 MG TABS tablet Take 10 mg by mouth daily.    [provider]  FARXIGA 5 MG TABS tablet  Take 5 mg by mouth every morning. 05/27/21   [provider]  irbesartan-hydrochlorothiazide  (AVALIDE) 150-12.5 MG tablet Take 1 tablet by mouth daily.    [provider]  letrozole  (FEMARA ) 2.5 MG tablet TAKE 1 TABLET BY MOUTH EVERY DAY 04/06/24   Burton, Lacie K, NP  NIFEdipine  (ADALAT  CC) 60 MG 24 hr tablet Take 60 mg by mouth daily.    [provider]  rosuvastatin (CRESTOR) 40 MG tablet Take 40 mg by mouth daily.    [provider]  Vitamin D3 (VITAMIN D) 25 MCG tablet Take 1,000 Units by mouth daily.    [provider]    Family History Family History  Problem Relation Age of Onset   Hypertension Mother    Hypertension Father    Prostate cancer Father 35   Breast cancer Maternal Aunt 40   Throat cancer Maternal Aunt        non smoker   Cirrhosis Maternal Uncle    Breast cancer Paternal Aunt 92   Prostate cancer Paternal Uncle    Prostate cancer Paternal Uncle    Colon cancer Cousin        maternal first cousin dx in his 71s   Lung cancer Cousin        smoker   Colon  polyps Neg Hx    Esophageal cancer Neg Hx    Rectal cancer Neg Hx    Stomach cancer Neg Hx     Social History Social History[1]   Allergies   Patient has no known allergies.   Review of Systems Review of Systems  Constitutional:  Positive for chills and fatigue. Negative for fever.  HENT:  Positive for congestion, rhinorrhea and sinus pressure. Negative for ear pain and sore throat.   Respiratory:  Positive for cough and shortness of breath. Negative for wheezing.   Gastrointestinal:  Positive for nausea. Negative for diarrhea and vomiting.  Musculoskeletal:  Positive for myalgias.     Physical Exam Triage Vital Signs ED Triage Vitals  Encounter Vitals Group     BP 05/07/24 1344 (!) 133/92     Girls Systolic BP Percentile --      Girls Diastolic BP Percentile --      Boys Systolic BP Percentile --      Boys Diastolic BP Percentile --      Pulse Rate 05/07/24 1344 (!) 108     Resp 05/07/24 1344 18     Temp 05/07/24 1344 98.1 F (36.7 C)     Temp Source 05/07/24 1344 Oral     SpO2 05/07/24 1344 95 %     Weight 05/07/24 1344 143 lb (64.9 kg)     Height --      Head Circumference --      Peak Flow --      Pain Score 05/07/24 1350 7     Pain Loc --      Pain Education --      Exclude from Growth Chart --    No data found.  Updated Vital Signs BP (!) 133/92   Pulse (!) 108   Temp 98.1 F (36.7 C) (Oral)   Resp 18   Wt 143 lb (64.9 kg)   LMP 04/23/2018 Comment: Hysterectomy 10/28/19  SpO2 95%   BMI 25.33 kg/m   Visual Acuity Right Eye Distance:   Left Eye Distance:   Bilateral Distance:    Right Eye Near:   Left Eye Near:    Bilateral Near:  Physical Exam Vitals reviewed.  Constitutional:      General: She is awake.     Appearance: Normal appearance. She is well-developed and well-groomed.  HENT:     Head: Normocephalic and atraumatic.     Right Ear: Hearing, tympanic membrane and ear canal normal.     Left Ear: Hearing, tympanic membrane and  ear canal normal.     Mouth/Throat:     Lips: Pink.     Mouth: Mucous membranes are moist.     Pharynx: Oropharynx is clear. Uvula midline. No pharyngeal swelling, oropharyngeal exudate, posterior oropharyngeal erythema, uvula swelling or postnasal drip.  Cardiovascular:     Rate and Rhythm: Normal rate and regular rhythm.     Pulses: Normal pulses.          Radial pulses are 2+ on the right side and 2+ on the left side.     Heart sounds: Normal heart sounds. No murmur heard.    No friction rub. No gallop.  Pulmonary:     Effort: Pulmonary effort is normal.     Breath sounds: Normal breath sounds. No decreased air movement. No decreased breath sounds, wheezing, rhonchi or rales.  Musculoskeletal:     Cervical back: Normal range of motion and neck supple.  Lymphadenopathy:     Head:     Right side of head: No submental, submandibular or preauricular adenopathy.     Left side of head: No submental, submandibular or preauricular adenopathy.     Cervical:     Right cervical: No superficial cervical adenopathy.    Left cervical: No superficial cervical adenopathy.     Upper Body:     Right upper body: No supraclavicular adenopathy.     Left upper body: No supraclavicular adenopathy.  Neurological:     Mental Status: She is alert.  Psychiatric:        Behavior: Behavior is cooperative.      UC Treatments / Results  Labs (all labs ordered are listed, but only abnormal results are displayed) Labs Reviewed  POCT INFLUENZA A/B    EKG   Radiology No results found.  Procedures Procedures (including critical care time)  Medications Ordered in UC Medications - No data to display  Initial Impression / Assessment and Plan / UC Course  I have reviewed the triage vital signs and the nursing notes.  Pertinent labs & imaging results that were available during my care of the patient were reviewed by me and considered in my medical decision making (see chart for details).       Final Clinical Impressions(s) / UC Diagnoses   Final diagnoses:  Flu-like symptoms  Patient is here today with concerns of flulike illness.  She states that she has had nasal congestion, rhinorrhea, chills and fatigue along with coughing and nausea since 05/04/2024.  She reports her son was previously sick with similar symptoms but is starting to recover.  Rapid testing is negative for flu A and B.  Physical exam is largely reassuring.  At this time recommend continued OTC medications.  Will send in prescription for Flonase , albuterol  rescue inhaler and promethazine -dextromethorphan cough syrup to assist with her symptoms.  Reviewed that this cough syrup can cause drowsiness and sedation so she should not use it if she needs to remain alert to drive.  ED and return precautions reviewed and provided in AVS.  Follow-up as needed    Discharge Instructions      Your testing was negative for Flu today.  Based on your described symptoms and the duration of symptoms it is likely that you have a viral upper respiratory infection (often called a cold)  Symptoms can last for 3-10 days with lingering cough and intermittent symptoms potentially  lasting several  weeks after that.  The goal of treatment at this time is to reduce your symptoms and discomfort   You can use the following medications and measures to help yourself feel better until your body fights this off: DayQuil/NyQuil, TheraFlu, Alka-Seltzer  (these medications typically have the same active ingredients in them so you can choose whichever one you prefer and take consistently during the day and night according to the manufactures instructions.) Flonase  A daily antihistamine such as Zyrtec, Claritin, Allegra per your preference.  Please choose 1 and take consistently. Increased fluids.  It is recommended that you take in at least 64 ounces of water  per day when you are not sick so it is important to increase this when you are sick and  your body may be running fever. Rest Cough drops Chloraseptic throat spray to help with sore throat Nasal saline spray or nasal flushes to help with congestion and runny nose    I am sending in an albuterol  rescue inhaler for you to use as needed up to every 6 hours. This can help with shortness of breath and severe coughing spells.   I am also sending in a script for promethazine - dextromethorphan cough syrup.  This should help with your coughing as well as the nausea.  Please be advised that it can cause drowsiness and sedation so do not take it if you need to remain alert or drive.  If your symptoms seem like they are getting worse over the next 5 to 7 days or not improving you can always follow-up here in urgent care or go to your primary care provider for further management. Go to the ER if you begin to have more serious symptoms such as shortness of breath, trouble breathing, loss of consciousness, swelling around the eyes, high fever, severe lasting headaches, vision changes or neck pain/stiffness.       ED Prescriptions     Medication Sig Dispense Auth. Provider   promethazine -dextromethorphan (PROMETHAZINE -DM) 6.25-15 MG/5ML syrup Take 5 mLs by mouth 4 (four) times daily as needed. 118 mL Asharia Lotter E, PA-C   albuterol  (VENTOLIN  HFA) 108 (90 Base) MCG/ACT inhaler Inhale 1-2 puffs into the lungs every 6 (six) hours as needed for wheezing or shortness of breath. 8 g Hopie Pellegrin E, PA-C   fluticasone  (FLONASE ) 50 MCG/ACT nasal spray Place 1 spray into both nostrils daily. 11.1 mL Chloeanne Poteet E, PA-C      PDMP not reviewed this encounter.     [1]  Social History Tobacco Use   Smoking status: Never   Smokeless tobacco: Never  Vaping Use   Vaping status: Never Used  Substance Use Topics   Alcohol use: No   Drug use: No     Tyrees Chopin, Rocky BRAVO, PA-C 05/07/24 1508  "

## 2024-06-09 ENCOUNTER — Ambulatory Visit: Payer: 59 | Admitting: Nurse Practitioner

## 2024-06-09 ENCOUNTER — Other Ambulatory Visit: Payer: 59
